# Patient Record
Sex: Female | Born: 1975
Health system: Southern US, Community
[De-identification: ages and names within clinical notes are randomized; demographics above are authoritative.]

## PROBLEM LIST (undated history)

## (undated) DIAGNOSIS — F32A Depression, unspecified: Secondary | ICD-10-CM

## (undated) DIAGNOSIS — Z923 Personal history of irradiation: Secondary | ICD-10-CM

## (undated) DIAGNOSIS — I209 Angina pectoris, unspecified: Secondary | ICD-10-CM

## (undated) DIAGNOSIS — D649 Anemia, unspecified: Secondary | ICD-10-CM

## (undated) DIAGNOSIS — Z803 Family history of malignant neoplasm of breast: Secondary | ICD-10-CM

## (undated) DIAGNOSIS — R002 Palpitations: Secondary | ICD-10-CM

## (undated) DIAGNOSIS — I499 Cardiac arrhythmia, unspecified: Secondary | ICD-10-CM

## (undated) DIAGNOSIS — R079 Chest pain, unspecified: Secondary | ICD-10-CM

## (undated) DIAGNOSIS — Z8669 Personal history of other diseases of the nervous system and sense organs: Secondary | ICD-10-CM

## (undated) DIAGNOSIS — M797 Fibromyalgia: Secondary | ICD-10-CM

## (undated) DIAGNOSIS — R000 Tachycardia, unspecified: Secondary | ICD-10-CM

## (undated) DIAGNOSIS — Z973 Presence of spectacles and contact lenses: Secondary | ICD-10-CM

## (undated) DIAGNOSIS — F329 Major depressive disorder, single episode, unspecified: Secondary | ICD-10-CM

## (undated) DIAGNOSIS — F419 Anxiety disorder, unspecified: Secondary | ICD-10-CM

## (undated) DIAGNOSIS — I340 Nonrheumatic mitral (valve) insufficiency: Secondary | ICD-10-CM

## (undated) DIAGNOSIS — C801 Malignant (primary) neoplasm, unspecified: Secondary | ICD-10-CM

## (undated) DIAGNOSIS — T7840XA Allergy, unspecified, initial encounter: Secondary | ICD-10-CM

## (undated) DIAGNOSIS — Q8500 Neurofibromatosis, unspecified: Secondary | ICD-10-CM

## (undated) DIAGNOSIS — Z8659 Personal history of other mental and behavioral disorders: Secondary | ICD-10-CM

## (undated) HISTORY — DX: Major depressive disorder, single episode, unspecified: F32.9

## (undated) HISTORY — DX: Chest pain, unspecified: R07.9

## (undated) HISTORY — DX: Allergy, unspecified, initial encounter: T78.40XA

## (undated) HISTORY — PX: COLONOSCOPY: SHX174

## (undated) HISTORY — DX: Family history of malignant neoplasm of breast: Z80.3

## (undated) HISTORY — PX: BREAST BIOPSY: SHX20

## (undated) HISTORY — DX: Personal history of other mental and behavioral disorders: Z86.59

## (undated) HISTORY — DX: Neurofibromatosis, unspecified: Q85.00

## (undated) HISTORY — DX: Nonrheumatic mitral (valve) insufficiency: I34.0

## (undated) HISTORY — DX: Personal history of other diseases of the nervous system and sense organs: Z86.69

## (undated) HISTORY — PX: ESOPHAGOGASTRODUODENOSCOPY: SHX1529

## (undated) HISTORY — DX: Palpitations: R00.2

## (undated) HISTORY — DX: Fibromyalgia: M79.7

## (undated) HISTORY — DX: Tachycardia, unspecified: R00.0

## (undated) HISTORY — DX: Anxiety disorder, unspecified: F41.9

## (undated) HISTORY — PX: WISDOM TOOTH EXTRACTION: SHX21

## (undated) HISTORY — DX: Depression, unspecified: F32.A

---

## 2002-01-23 ENCOUNTER — Emergency Department (HOSPITAL_COMMUNITY): Admission: EM | Admit: 2002-01-23 | Discharge: 2002-01-23 | Payer: Self-pay | Admitting: Emergency Medicine

## 2002-01-28 ENCOUNTER — Ambulatory Visit (HOSPITAL_COMMUNITY): Admission: RE | Admit: 2002-01-28 | Discharge: 2002-01-28 | Payer: Self-pay | Admitting: Family Medicine

## 2002-01-28 ENCOUNTER — Encounter: Payer: Self-pay | Admitting: Family Medicine

## 2002-06-28 ENCOUNTER — Emergency Department (HOSPITAL_COMMUNITY): Admission: EM | Admit: 2002-06-28 | Discharge: 2002-06-28 | Payer: Self-pay | Admitting: Emergency Medicine

## 2002-06-28 ENCOUNTER — Encounter: Payer: Self-pay | Admitting: Emergency Medicine

## 2003-09-23 ENCOUNTER — Ambulatory Visit (HOSPITAL_COMMUNITY): Admission: RE | Admit: 2003-09-23 | Discharge: 2003-09-23 | Payer: Self-pay | Admitting: Family Medicine

## 2004-08-22 ENCOUNTER — Ambulatory Visit (HOSPITAL_COMMUNITY): Admission: RE | Admit: 2004-08-22 | Discharge: 2004-08-22 | Payer: Self-pay | Admitting: Family Medicine

## 2004-09-20 ENCOUNTER — Ambulatory Visit (HOSPITAL_COMMUNITY): Admission: RE | Admit: 2004-09-20 | Discharge: 2004-09-20 | Payer: Self-pay | Admitting: Family Medicine

## 2004-09-24 ENCOUNTER — Ambulatory Visit (HOSPITAL_COMMUNITY): Admission: RE | Admit: 2004-09-24 | Discharge: 2004-09-24 | Payer: Self-pay | Admitting: Family Medicine

## 2004-12-14 ENCOUNTER — Ambulatory Visit (HOSPITAL_COMMUNITY): Admission: RE | Admit: 2004-12-14 | Discharge: 2004-12-14 | Payer: Self-pay | Admitting: Neurology

## 2005-01-01 ENCOUNTER — Encounter: Admission: RE | Admit: 2005-01-01 | Discharge: 2005-01-01 | Payer: Self-pay | Admitting: Occupational Medicine

## 2005-05-26 ENCOUNTER — Emergency Department (HOSPITAL_COMMUNITY): Admission: EM | Admit: 2005-05-26 | Discharge: 2005-05-26 | Payer: Self-pay | Admitting: Emergency Medicine

## 2005-08-01 ENCOUNTER — Ambulatory Visit: Payer: Self-pay | Admitting: Oncology

## 2005-10-18 ENCOUNTER — Emergency Department (HOSPITAL_COMMUNITY): Admission: EM | Admit: 2005-10-18 | Discharge: 2005-10-18 | Payer: Self-pay | Admitting: Emergency Medicine

## 2005-11-15 ENCOUNTER — Ambulatory Visit: Payer: Self-pay | Admitting: Oncology

## 2006-07-24 ENCOUNTER — Ambulatory Visit: Payer: Self-pay | Admitting: Oncology

## 2006-07-28 LAB — CBC WITH DIFFERENTIAL/PLATELET
BASO%: 1.2 % (ref 0.0–2.0)
LYMPH%: 25.9 % (ref 14.0–48.0)
MCHC: 34.4 g/dL (ref 32.0–36.0)
MCV: 85.8 fL (ref 81.0–101.0)
MONO#: 0.6 10*3/uL (ref 0.1–0.9)
MONO%: 7.6 % (ref 0.0–13.0)
Platelets: 435 10*3/uL — ABNORMAL HIGH (ref 145–400)
RBC: 4.91 10*6/uL (ref 3.70–5.32)
RDW: 12.4 % (ref 11.3–14.5)
WBC: 7.5 10*3/uL (ref 3.9–10.0)

## 2006-10-14 ENCOUNTER — Emergency Department (HOSPITAL_COMMUNITY): Admission: EM | Admit: 2006-10-14 | Discharge: 2006-10-14 | Payer: Self-pay | Admitting: Emergency Medicine

## 2007-07-22 ENCOUNTER — Ambulatory Visit: Payer: Self-pay | Admitting: Oncology

## 2007-07-27 LAB — CBC WITH DIFFERENTIAL/PLATELET
BASO%: 1.7 % (ref 0.0–2.0)
MCHC: 35.4 g/dL (ref 32.0–36.0)
MONO#: 0.6 10*3/uL (ref 0.1–0.9)
RBC: 4.73 10*6/uL (ref 3.70–5.32)
RDW: 10 % — ABNORMAL LOW (ref 11.3–14.5)
WBC: 6.9 10*3/uL (ref 3.9–10.0)
lymph#: 1.8 10*3/uL (ref 0.9–3.3)

## 2007-08-12 ENCOUNTER — Ambulatory Visit: Payer: Self-pay | Admitting: Vascular Surgery

## 2008-07-22 ENCOUNTER — Ambulatory Visit: Payer: Self-pay | Admitting: Oncology

## 2008-08-12 LAB — CBC WITH DIFFERENTIAL/PLATELET
EOS%: 4.2 % (ref 0.0–7.0)
Eosinophils Absolute: 0.3 10*3/uL (ref 0.0–0.5)
LYMPH%: 24.5 % (ref 14.0–48.0)
MCH: 29.3 pg (ref 26.0–34.0)
MCV: 83.3 fL (ref 81.0–101.0)
MONO%: 9.3 % (ref 0.0–13.0)
NEUT#: 4.3 10*3/uL (ref 1.5–6.5)
NEUT%: 60.2 % (ref 39.6–76.8)
Platelets: 436 10*3/uL — ABNORMAL HIGH (ref 145–400)
RBC: 4.84 10*6/uL (ref 3.70–5.32)
RDW: 12.1 % (ref 11.3–14.5)

## 2008-11-30 ENCOUNTER — Encounter: Payer: Self-pay | Admitting: Gastroenterology

## 2008-12-13 ENCOUNTER — Encounter: Payer: Self-pay | Admitting: Gastroenterology

## 2009-01-12 ENCOUNTER — Ambulatory Visit: Payer: Self-pay | Admitting: Gastroenterology

## 2009-01-12 DIAGNOSIS — K921 Melena: Secondary | ICD-10-CM | POA: Insufficient documentation

## 2009-01-12 DIAGNOSIS — R195 Other fecal abnormalities: Secondary | ICD-10-CM | POA: Insufficient documentation

## 2009-02-01 ENCOUNTER — Ambulatory Visit: Payer: Self-pay | Admitting: Gastroenterology

## 2010-04-26 ENCOUNTER — Ambulatory Visit: Payer: Self-pay | Admitting: Cardiovascular Disease

## 2010-04-30 ENCOUNTER — Ambulatory Visit: Payer: Self-pay | Admitting: Cardiovascular Disease

## 2010-10-06 ENCOUNTER — Encounter: Payer: Self-pay | Admitting: Family Medicine

## 2010-11-21 ENCOUNTER — Other Ambulatory Visit (HOSPITAL_COMMUNITY): Payer: Self-pay | Admitting: Pediatrics

## 2010-11-21 DIAGNOSIS — Q8501 Neurofibromatosis, type 1: Secondary | ICD-10-CM

## 2010-12-01 ENCOUNTER — Ambulatory Visit (HOSPITAL_COMMUNITY)
Admission: RE | Admit: 2010-12-01 | Discharge: 2010-12-01 | Disposition: A | Discharge status: Home / Self Care | Payer: 59 | Source: Ambulatory Visit | Attending: Pediatrics | Admitting: Pediatrics

## 2010-12-01 DIAGNOSIS — J3489 Other specified disorders of nose and nasal sinuses: Secondary | ICD-10-CM | POA: Insufficient documentation

## 2010-12-01 DIAGNOSIS — Q8501 Neurofibromatosis, type 1: Secondary | ICD-10-CM

## 2010-12-01 DIAGNOSIS — D1809 Hemangioma of other sites: Secondary | ICD-10-CM | POA: Insufficient documentation

## 2010-12-01 DIAGNOSIS — H9319 Tinnitus, unspecified ear: Secondary | ICD-10-CM | POA: Insufficient documentation

## 2010-12-01 DIAGNOSIS — G96198 Other disorders of meninges, not elsewhere classified: Secondary | ICD-10-CM | POA: Insufficient documentation

## 2010-12-01 DIAGNOSIS — M5090 Cervical disc disorder, unspecified, unspecified cervical region: Secondary | ICD-10-CM | POA: Insufficient documentation

## 2010-12-01 DIAGNOSIS — M5126 Other intervertebral disc displacement, lumbar region: Secondary | ICD-10-CM | POA: Insufficient documentation

## 2010-12-01 MED ORDER — GADOBENATE DIMEGLUMINE 529 MG/ML IV SOLN
12.0000 mL | Freq: Once | INTRAVENOUS | Status: AC | PRN
  Administered 2010-12-01: 12 mL via INTRAVENOUS

## 2010-12-03 ENCOUNTER — Other Ambulatory Visit (HOSPITAL_COMMUNITY): Payer: Self-pay

## 2011-01-29 NOTE — Procedures (Signed)
VASCULAR LAB EXAM   INDICATION:  Abdominal bruit.   HISTORY:  Diabetes:  No.  Cardiac:  Tachycardia.  Hypertension:  No.   EXAM:  Abdominal duplex.   IMPRESSION:  1. Proximal aorta = 160 cm/s.  2. Mid aorta = 110 cm/s.  3. Distal aorta = 93 cm/s.  4. Right renal artery origin = 96/23 cm/s.  5. Left renal artery origin = 121/31 cm/s.  6. Superior mesenteric artery = 149/30 cm/s.  7. Inferior mesenteric artery = 117 cm/s.  8. Celiac artery with inspiration = 317/134 cm/s.  9. Celiac artery with expiration = 206/44 cm/s.  10.Abdominal aorta appears patent with no evidence of dilatation or      stenosis.  11.Celiac axis is patent with no evidence of significant pathology;      however, demonstrates evidence of compression syndrome with      velocities significantly increasing with inspiration versus      expiration, which is probable cause of abdominal bruit.  12.Superior mesenteric artery, inferior mesenteric artery, and      bilateral renal artery origins appear patent with no evidence of      ostial stenosis.   ___________________________________________  Larina Earthly, M.D.   AS/MEDQ  D:  08/12/2007  T:  08/12/2007  Job:  [id number]

## 2011-06-24 ENCOUNTER — Telehealth: Payer: Self-pay | Admitting: Cardiovascular Disease

## 2011-06-24 NOTE — Telephone Encounter (Signed)
Pt called. Refill of toprol called to [facility] pharmacy [phone]

## 2011-06-25 NOTE — Telephone Encounter (Signed)
Pt cant make dr app now, will see if primary dr will refill and will call back if we need to fill, pt not seen since 2011.

## 2012-02-21 ENCOUNTER — Telehealth: Payer: Self-pay | Admitting: Cardiovascular Disease

## 2012-02-21 MED ORDER — METOPROLOL SUCCINATE ER 25 MG PO TB24: 25.0000 mg | ORAL_TABLET | Freq: Every day | ORAL | Status: DC

## 2012-02-21 MED ORDER — PROPRANOLOL HCL 10 MG PO TABS: 10.0000 mg | ORAL_TABLET | Freq: Four times a day (QID) | ORAL | Status: DC | PRN

## 2012-02-21 NOTE — Telephone Encounter (Signed)
pt been off meds, didn't see pcp for refill of toprol/ propranolol 10 mg  from 06/24/12 phone conversation. bp 137/86, p 100,  pt at work,  Doctor, hospital with Dr [name]/ paper chart reviewed, pt to restart toprol xl 25 mg daily, use propranolol 10 mg prn palpitations and f/u 1-2 months or sooner if medication don't resolve.

## 2012-02-21 NOTE — Telephone Encounter (Signed)
Attempted several call backs/ msg left for her to call back.

## 2012-02-21 NOTE — Telephone Encounter (Signed)
New msg Pt called and said her heart rate is up today 120-150. Please call her back

## 2012-02-21 NOTE — Telephone Encounter (Signed)
Informed pt of Dr Namon Cirri recommendations/ meds ordered/ app made for f/u, pt to call if has further symptoms. Pt agreed to plan.

## 2012-02-26 ENCOUNTER — Encounter: Payer: Self-pay | Admitting: *Deleted

## 2012-02-26 DIAGNOSIS — R079 Chest pain, unspecified: Secondary | ICD-10-CM | POA: Insufficient documentation

## 2012-02-26 DIAGNOSIS — R000 Tachycardia, unspecified: Secondary | ICD-10-CM | POA: Insufficient documentation

## 2012-02-26 DIAGNOSIS — Z8669 Personal history of other diseases of the nervous system and sense organs: Secondary | ICD-10-CM | POA: Insufficient documentation

## 2012-02-26 DIAGNOSIS — Z8659 Personal history of other mental and behavioral disorders: Secondary | ICD-10-CM | POA: Insufficient documentation

## 2012-02-26 DIAGNOSIS — I340 Nonrheumatic mitral (valve) insufficiency: Secondary | ICD-10-CM | POA: Insufficient documentation

## 2012-02-26 DIAGNOSIS — R002 Palpitations: Secondary | ICD-10-CM | POA: Insufficient documentation

## 2012-02-26 DIAGNOSIS — Q8500 Neurofibromatosis, unspecified: Secondary | ICD-10-CM | POA: Insufficient documentation

## 2012-03-03 ENCOUNTER — Telehealth: Payer: Self-pay | Admitting: Cardiovascular Disease

## 2012-03-03 MED ORDER — METOPROLOL SUCCINATE ER 50 MG PO TB24: 50.0000 mg | ORAL_TABLET | Freq: Every day | ORAL | Status: DC

## 2012-03-03 NOTE — Telephone Encounter (Signed)
Please have her increase metoprolol to 50 mg daily.  Avoid salt.  Set up ov in a week.

## 2012-03-03 NOTE — Telephone Encounter (Signed)
New msg Pt is taking toprol last Friday BP 140/100 still elevated and hr still 100. Please call

## 2012-03-03 NOTE — Telephone Encounter (Signed)
At pcp yesterday bp was 140/100 p 100,   today 122/92 p 100, pt is on metoprolol succinate 25 mg daily. Propranolol being used occasionally for palpitations.  Please advise if you would like her meds adjusted.

## 2012-03-03 NOTE — Telephone Encounter (Signed)
Pt called,metoprolol 25mg   to be doubled till gone then get new script for one tablet daily. High sodium foods reviewed. App made for 1 week.

## 2012-03-05 ENCOUNTER — Encounter: Payer: Self-pay | Admitting: Cardiovascular Disease

## 2012-03-05 ENCOUNTER — Ambulatory Visit (INDEPENDENT_AMBULATORY_CARE_PROVIDER_SITE_OTHER): Payer: 59 | Admitting: Cardiovascular Disease

## 2012-03-05 VITALS — BP 114/84 | HR 88 | Resp 14 | Ht 62.0 in | Wt 136.0 lb

## 2012-03-05 DIAGNOSIS — R000 Tachycardia, unspecified: Secondary | ICD-10-CM

## 2012-03-05 DIAGNOSIS — R002 Palpitations: Secondary | ICD-10-CM

## 2012-03-05 NOTE — Assessment & Plan Note (Signed)
Anne Horton presents today for followup of her tachycardia and her palpitations. She's feeling much better on the Toprol-XL 50 mg a day. She's had labs at her medical doctors and have been satisfactory. Her TSH is been normal.  She's had lots of stress at home and at work. This is probably the cause of her palpitations.  We Also considered the possibility that she may be doing close to menopause.

## 2012-03-05 NOTE — Patient Instructions (Addendum)
Your physician wants you to follow-up in: 6 months  You will receive a reminder letter in the mail two months in advance. If you don't receive a letter, please call our office to schedule the follow-up appointment.  Your physician recommends that you continue on your current medications as directed. Please refer to the Current Medication list given to you today.  

## 2012-03-05 NOTE — Progress Notes (Signed)
    Anne Horton Date of Birth  08-18-1976       [facility]    [place] [address]  [address] [place], [place]  [address]   [place], [place]  [address] [phone]     [phone]   Fax  [phone]    Fax [phone]  Problem List: 1, HTN 2. Palpitations 3. Neurofibromatosis  History of Present Illness:  Anne Horton is a 36 year old female who was seen in the past. She has had some problems with high blood pressure and tachycardia recently. We started her on Toprol-XL 25 mg a day. This seemed to help but she was still having some high heart rates. We increased her rate to 50 and she seems to be feeling a lot better.  She's been under lots of stress. She has lots of stress at home and at work.  Current Outpatient Prescriptions on File Prior to Visit  Medication Sig Dispense Refill  . cetirizine (ZYRTEC) 10 MG tablet Take 10 mg by mouth as needed.      . DULoxetine (CYMBALTA) 30 MG capsule Take 30 mg by mouth daily.      Anne Horton 91-Day (JOLESSA PO) Take by mouth.      . metoprolol succinate (TOPROL XL) 50 MG 24 hr tablet Take 1 tablet (50 mg total) by mouth daily.  30 tablet  2  . propranolol (INDERAL) 10 MG tablet Take 1 tablet (10 mg total) by mouth 4 (four) times daily as needed (palpitations).  30 tablet  2    Allergies  Allergen Reactions  . Latex   . Other     Latex-rash    Past Medical History  Diagnosis Date  . Chest pain   . Palpitations   . Neurofibromatosis   . Tachycardia     Hx. of  . Hx of migraines   . H/O: depression   . Mitral regurgitation     mild per echo EF 55-60%    Past Surgical History  Procedure Date  . Cesarean section     History  Smoking status  . Former Smoker  . Quit date: 09/17/1995  Smokeless tobacco  . Not on file    History  Alcohol Use     No family history on file.  Reviw of Systems:  Reviewed in the HPI.  All other systems are  negative.  Physical Exam: Blood pressure 114/84, pulse 88, resp. rate 14, height 5\' 2"  (1.575 m), weight 136 lb (61.689 kg), last menstrual period 11/17/2011. General: Well developed, well nourished, in no acute distress.  She has several fibromas are visible. She has a history of neurofibromatosis  Head: Normocephalic, atraumatic, sclera non-icteric, mucus membranes are moist,   Neck: Supple. Carotids are 2 + without bruits. No JVD  Lungs: Clear bilaterally to auscultation.  Heart: regular rate.  normal  S1 S2. No murmurs, gallops or rubs.  Abdomen: Soft, non-tender, non-distended with normal bowel sounds. No hepatomegaly. No rebound/guarding. No masses.  Msk:  Strength and tone are normal  Extremities: No clubbing or cyanosis. No edema.  Distal pedal pulses are 2+ and equal bilaterally.  Neuro: Alert and oriented X 3. Moves all extremities spontaneously.  Psych:  Responds to questions appropriately with a normal affect.  ECG: March 05, 2012 - NSR at 81, no ST or T wave changes  Assessment / Plan:

## 2012-03-24 ENCOUNTER — Ambulatory Visit: Payer: Commercial Managed Care - PPO | Admitting: Cardiovascular Disease

## 2012-06-11 ENCOUNTER — Other Ambulatory Visit: Payer: Self-pay | Admitting: Cardiovascular Disease

## 2012-06-11 NOTE — Telephone Encounter (Signed)
Fax Received. Refill Completed. Adir Schicker Chowoe (R.M.A)   

## 2012-06-23 ENCOUNTER — Encounter: Payer: Self-pay | Admitting: Cardiovascular Disease

## 2012-12-30 ENCOUNTER — Telehealth: Payer: Self-pay

## 2012-12-30 MED ORDER — METOPROLOL SUCCINATE ER 50 MG PO TB24: ORAL_TABLET | ORAL | Status: DC

## 2012-12-30 NOTE — Telephone Encounter (Signed)
pt called to rqst refill for metoporol. pt was past due for follow up w/Dr.[name] scheduled pt for 03/15/13 @2 :00 pm.refilled  metoprol 30 R-3.completed

## 2013-01-08 ENCOUNTER — Encounter (HOSPITAL_COMMUNITY): Payer: Self-pay | Admitting: Emergency Medicine

## 2013-01-08 ENCOUNTER — Emergency Department (HOSPITAL_COMMUNITY): Payer: 59

## 2013-01-08 ENCOUNTER — Emergency Department (HOSPITAL_COMMUNITY)
Admission: EM | Admit: 2013-01-08 | Discharge: 2013-01-08 | Disposition: A | Discharge status: Home / Self Care | Payer: 59 | Attending: Emergency Medicine | Admitting: Emergency Medicine

## 2013-01-08 DIAGNOSIS — Z87891 Personal history of nicotine dependence: Secondary | ICD-10-CM | POA: Insufficient documentation

## 2013-01-08 DIAGNOSIS — Y92410 Unspecified street and highway as the place of occurrence of the external cause: Secondary | ICD-10-CM | POA: Insufficient documentation

## 2013-01-08 DIAGNOSIS — IMO0002 Reserved for concepts with insufficient information to code with codable children: Secondary | ICD-10-CM | POA: Insufficient documentation

## 2013-01-08 DIAGNOSIS — Z8679 Personal history of other diseases of the circulatory system: Secondary | ICD-10-CM | POA: Insufficient documentation

## 2013-01-08 DIAGNOSIS — Y9389 Activity, other specified: Secondary | ICD-10-CM | POA: Insufficient documentation

## 2013-01-08 DIAGNOSIS — Z91040 Latex allergy status: Secondary | ICD-10-CM | POA: Insufficient documentation

## 2013-01-08 DIAGNOSIS — Z8659 Personal history of other mental and behavioral disorders: Secondary | ICD-10-CM | POA: Insufficient documentation

## 2013-01-08 DIAGNOSIS — Z79899 Other long term (current) drug therapy: Secondary | ICD-10-CM | POA: Insufficient documentation

## 2013-01-08 DIAGNOSIS — Z8781 Personal history of (healed) traumatic fracture: Secondary | ICD-10-CM | POA: Insufficient documentation

## 2013-01-08 DIAGNOSIS — S82402A Unspecified fracture of shaft of left fibula, initial encounter for closed fracture: Secondary | ICD-10-CM

## 2013-01-08 DIAGNOSIS — S82899A Other fracture of unspecified lower leg, initial encounter for closed fracture: Secondary | ICD-10-CM | POA: Insufficient documentation

## 2013-01-08 DIAGNOSIS — S0993XA Unspecified injury of face, initial encounter: Secondary | ICD-10-CM | POA: Insufficient documentation

## 2013-01-08 DIAGNOSIS — Z8669 Personal history of other diseases of the nervous system and sense organs: Secondary | ICD-10-CM | POA: Insufficient documentation

## 2013-01-08 MED ORDER — OXYCODONE-ACETAMINOPHEN 5-325 MG PO TABS
2.0000 | ORAL_TABLET | Freq: Once | ORAL | Status: AC
  Administered 2013-01-08: 2 via ORAL
  Filled 2013-01-08: qty 2

## 2013-01-08 MED ORDER — OXYCODONE-ACETAMINOPHEN 5-325 MG PO TABS: 1.0000 | ORAL_TABLET | Freq: Four times a day (QID) | ORAL | Status: DC | PRN
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
## 2013-01-08 NOTE — Progress Notes (Signed)
Orthopedic Tech Progress Note Patient Details:  Anne Horton 07-30-1976 [id number] Applied short leg splint to LLE. Ortho Devices Type of Ortho Device: Short leg splint Ortho Device/Splint Interventions: Application   Lesle Chris 01/08/2013, 9:16 PM
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
## 2013-01-09 ENCOUNTER — Encounter (HOSPITAL_BASED_OUTPATIENT_CLINIC_OR_DEPARTMENT_OTHER): Payer: Self-pay | Admitting: *Deleted

## 2013-01-09 ENCOUNTER — Emergency Department (HOSPITAL_BASED_OUTPATIENT_CLINIC_OR_DEPARTMENT_OTHER)
Admission: EM | Admit: 2013-01-09 | Discharge: 2013-01-09 | Disposition: A | Discharge status: Home / Self Care | Payer: 59 | Attending: Emergency Medicine | Admitting: Emergency Medicine

## 2013-01-09 DIAGNOSIS — S8011XA Contusion of right lower leg, initial encounter: Secondary | ICD-10-CM

## 2013-01-09 DIAGNOSIS — S8010XA Contusion of unspecified lower leg, initial encounter: Secondary | ICD-10-CM | POA: Insufficient documentation

## 2013-01-09 DIAGNOSIS — Y92410 Unspecified street and highway as the place of occurrence of the external cause: Secondary | ICD-10-CM | POA: Insufficient documentation

## 2013-01-09 DIAGNOSIS — I059 Rheumatic mitral valve disease, unspecified: Secondary | ICD-10-CM | POA: Insufficient documentation

## 2013-01-09 DIAGNOSIS — Z79899 Other long term (current) drug therapy: Secondary | ICD-10-CM | POA: Insufficient documentation

## 2013-01-09 DIAGNOSIS — Z87891 Personal history of nicotine dependence: Secondary | ICD-10-CM | POA: Insufficient documentation

## 2013-01-09 DIAGNOSIS — F3289 Other specified depressive episodes: Secondary | ICD-10-CM | POA: Insufficient documentation

## 2013-01-09 DIAGNOSIS — Z8679 Personal history of other diseases of the circulatory system: Secondary | ICD-10-CM | POA: Insufficient documentation

## 2013-01-09 DIAGNOSIS — Y9389 Activity, other specified: Secondary | ICD-10-CM | POA: Insufficient documentation

## 2013-01-09 DIAGNOSIS — F329 Major depressive disorder, single episode, unspecified: Secondary | ICD-10-CM | POA: Insufficient documentation

## 2013-01-09 DIAGNOSIS — Z91040 Latex allergy status: Secondary | ICD-10-CM | POA: Insufficient documentation

## 2013-01-09 DIAGNOSIS — Z8669 Personal history of other diseases of the nervous system and sense organs: Secondary | ICD-10-CM | POA: Insufficient documentation

## 2013-01-09 MED ORDER — OXYCODONE-ACETAMINOPHEN 5-325 MG PO TABS
2.0000 | ORAL_TABLET | Freq: Once | ORAL | Status: AC
  Administered 2013-01-09: 2 via ORAL
  Filled 2013-01-09 (×2): qty 2

## 2013-01-09 NOTE — ED Provider Notes (Signed)
Medical screening examination/treatment/procedure(s) were performed by non-physician practitioner and as supervising physician I was immediately available for consultation/collaboration.  Toy Baker, MD 01/09/13 [phone]

## 2013-01-09 NOTE — ED Provider Notes (Signed)
Medical screening examination/treatment/procedure(s) were performed by non-physician practitioner and as supervising physician I was immediately available for consultation/collaboration.   Glynn Octave, MD 01/09/13 2306
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
## 2013-01-09 NOTE — ED Notes (Signed)
Provider at the bedside.  

## 2013-01-12 ENCOUNTER — Other Ambulatory Visit (HOSPITAL_COMMUNITY): Payer: Self-pay | Admitting: Orthopedic Surgery

## 2013-01-12 ENCOUNTER — Ambulatory Visit (HOSPITAL_COMMUNITY)
Admission: RE | Admit: 2013-01-12 | Discharge: 2013-01-12 | Disposition: A | Discharge status: Home / Self Care | Payer: 59 | Source: Ambulatory Visit | Attending: Orthopedic Surgery | Admitting: Orthopedic Surgery

## 2013-01-12 DIAGNOSIS — M79609 Pain in unspecified limb: Secondary | ICD-10-CM

## 2013-01-12 DIAGNOSIS — M7989 Other specified soft tissue disorders: Secondary | ICD-10-CM

## 2013-01-12 DIAGNOSIS — M25562 Pain in left knee: Secondary | ICD-10-CM

## 2013-01-12 NOTE — Progress Notes (Signed)
*  PRELIMINARY RESULTS* Vascular Ultrasound Left lower extremity venous duplex has been completed.  Preliminary findings: Left=  No evidence of DVT. There is a heterogenous area located at the anterior medial lower leg that is consistent with hematoma. This could also potentially be a muscle tear.  Called report to [employer].  Farrel Demark, RDMS, RVT  01/12/2013, 10:50 AM

## 2013-02-15 ENCOUNTER — Ambulatory Visit (HOSPITAL_COMMUNITY)
Admission: RE | Admit: 2013-02-15 | Discharge: 2013-02-15 | Disposition: A | Discharge status: Home / Self Care | Payer: 59 | Source: Ambulatory Visit | Attending: Orthopedic Surgery | Admitting: Orthopedic Surgery

## 2013-02-15 ENCOUNTER — Other Ambulatory Visit (HOSPITAL_COMMUNITY): Payer: Self-pay | Admitting: Orthopedic Surgery

## 2013-02-15 DIAGNOSIS — M25562 Pain in left knee: Secondary | ICD-10-CM

## 2013-02-15 DIAGNOSIS — M79609 Pain in unspecified limb: Secondary | ICD-10-CM

## 2013-02-15 DIAGNOSIS — M7989 Other specified soft tissue disorders: Secondary | ICD-10-CM

## 2013-02-15 NOTE — Progress Notes (Signed)
*  Preliminary Results* Left lower extremity venous duplex completed. Left lower extremity is negative for deep vein thrombosis. There is no evidence of left baker's cyst. There is a heterogenous area noted in the left anteromedial mid calf, suggestive of a hematoma.  Preliminary results discussed with Junious Dresser of Dr.Gioffre's office.  02/15/2013 3:25 PM Gertie Fey, RDMS, RDCS

## 2013-03-15 ENCOUNTER — Ambulatory Visit (INDEPENDENT_AMBULATORY_CARE_PROVIDER_SITE_OTHER): Payer: 59 | Admitting: Cardiovascular Disease

## 2013-03-15 ENCOUNTER — Encounter: Payer: Self-pay | Admitting: Cardiovascular Disease

## 2013-03-15 VITALS — BP 108/80 | HR 77 | Ht 62.0 in | Wt 140.1 lb

## 2013-03-15 DIAGNOSIS — R079 Chest pain, unspecified: Secondary | ICD-10-CM

## 2013-03-15 DIAGNOSIS — R000 Tachycardia, unspecified: Secondary | ICD-10-CM

## 2013-03-15 MED ORDER — PROPRANOLOL HCL 10 MG PO TABS: 10.0000 mg | ORAL_TABLET | Freq: Four times a day (QID) | ORAL | Status: DC | PRN

## 2013-03-15 NOTE — Patient Instructions (Addendum)
Your physician wants you to follow-up in: 1 year  You will receive a reminder letter in the mail two months in advance. If you don't receive a letter, please call our office to schedule the follow-up appointment.  Please try to bring labs with you on your next visit

## 2013-03-15 NOTE — Assessment & Plan Note (Signed)
Continue current meds.  I have asked her to exercise when possible.

## 2013-03-15 NOTE — Assessment & Plan Note (Signed)
She has some atypical CP.  Stable.
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
## 2013-05-14 ENCOUNTER — Other Ambulatory Visit: Payer: Self-pay | Admitting: Cardiovascular Disease

## 2013-05-14 ENCOUNTER — Other Ambulatory Visit: Payer: Self-pay | Admitting: *Deleted

## 2013-05-14 ENCOUNTER — Ambulatory Visit (HOSPITAL_BASED_OUTPATIENT_CLINIC_OR_DEPARTMENT_OTHER): Payer: 59 | Admitting: Lab

## 2013-05-14 DIAGNOSIS — D649 Anemia, unspecified: Secondary | ICD-10-CM

## 2013-05-14 LAB — CBC WITH DIFFERENTIAL/PLATELET
Basophils Absolute: 0.1 10*3/uL (ref 0.0–0.1)
Eosinophils Absolute: 0.3 10*3/uL (ref 0.0–0.5)
HCT: 24.8 % — ABNORMAL LOW (ref 34.8–46.6)
HGB: 7.6 g/dL — ABNORMAL LOW (ref 11.6–15.9)
LYMPH%: 28 % (ref 14.0–49.7)
MCHC: 30.7 g/dL — ABNORMAL LOW (ref 31.5–36.0)
MONO#: 0.9 10*3/uL (ref 0.1–0.9)
NEUT#: 4.7 10*3/uL (ref 1.5–6.5)
NEUT%: 55.4 % (ref 38.4–76.8)
Platelets: 455 10*3/uL — ABNORMAL HIGH (ref 145–400)
WBC: 8.4 10*3/uL (ref 3.9–10.3)
lymph#: 2.4 10*3/uL (ref 0.9–3.3)

## 2013-05-21 ENCOUNTER — Encounter (HOSPITAL_COMMUNITY): Payer: Self-pay | Admitting: *Deleted

## 2013-05-21 ENCOUNTER — Telehealth: Payer: Self-pay | Admitting: *Deleted

## 2013-05-21 ENCOUNTER — Emergency Department (HOSPITAL_COMMUNITY)
Admission: EM | Admit: 2013-05-21 | Discharge: 2013-05-21 | Disposition: A | Discharge status: Home / Self Care | Payer: 59 | Attending: Emergency Medicine | Admitting: Emergency Medicine

## 2013-05-21 DIAGNOSIS — F329 Major depressive disorder, single episode, unspecified: Secondary | ICD-10-CM | POA: Insufficient documentation

## 2013-05-21 DIAGNOSIS — R51 Headache: Secondary | ICD-10-CM | POA: Insufficient documentation

## 2013-05-21 DIAGNOSIS — R42 Dizziness and giddiness: Secondary | ICD-10-CM | POA: Insufficient documentation

## 2013-05-21 DIAGNOSIS — Z79899 Other long term (current) drug therapy: Secondary | ICD-10-CM | POA: Insufficient documentation

## 2013-05-21 DIAGNOSIS — Z8669 Personal history of other diseases of the nervous system and sense organs: Secondary | ICD-10-CM | POA: Insufficient documentation

## 2013-05-21 DIAGNOSIS — Z87828 Personal history of other (healed) physical injury and trauma: Secondary | ICD-10-CM | POA: Insufficient documentation

## 2013-05-21 DIAGNOSIS — R52 Pain, unspecified: Secondary | ICD-10-CM | POA: Insufficient documentation

## 2013-05-21 DIAGNOSIS — F3289 Other specified depressive episodes: Secondary | ICD-10-CM | POA: Insufficient documentation

## 2013-05-21 DIAGNOSIS — Z7982 Long term (current) use of aspirin: Secondary | ICD-10-CM | POA: Insufficient documentation

## 2013-05-21 DIAGNOSIS — R079 Chest pain, unspecified: Secondary | ICD-10-CM

## 2013-05-21 DIAGNOSIS — Z91040 Latex allergy status: Secondary | ICD-10-CM | POA: Insufficient documentation

## 2013-05-21 DIAGNOSIS — R5381 Other malaise: Secondary | ICD-10-CM | POA: Insufficient documentation

## 2013-05-21 DIAGNOSIS — Z87891 Personal history of nicotine dependence: Secondary | ICD-10-CM | POA: Insufficient documentation

## 2013-05-21 DIAGNOSIS — Z8679 Personal history of other diseases of the circulatory system: Secondary | ICD-10-CM | POA: Insufficient documentation

## 2013-05-21 DIAGNOSIS — R0602 Shortness of breath: Secondary | ICD-10-CM | POA: Insufficient documentation

## 2013-05-21 DIAGNOSIS — R0789 Other chest pain: Secondary | ICD-10-CM | POA: Insufficient documentation

## 2013-05-21 DIAGNOSIS — R110 Nausea: Secondary | ICD-10-CM | POA: Insufficient documentation

## 2013-05-21 DIAGNOSIS — R5383 Other fatigue: Secondary | ICD-10-CM

## 2013-05-21 DIAGNOSIS — D649 Anemia, unspecified: Secondary | ICD-10-CM | POA: Insufficient documentation

## 2013-05-21 HISTORY — DX: Anemia, unspecified: D64.9

## 2013-05-21 LAB — BASIC METABOLIC PANEL
BUN: 11 mg/dL (ref 6–23)
CO2: 26 mEq/L (ref 19–32)
Calcium: 9.2 mg/dL (ref 8.4–10.5)
Chloride: 103 mEq/L (ref 96–112)
Creatinine, Ser: 0.56 mg/dL (ref 0.50–1.10)
GFR calc Af Amer: >90 mL/min (ref >90)
GFR calc non Af Amer: >90 mL/min (ref >90)
Glucose, Bld: 92 mg/dL (ref 70–99)
Potassium: 3.9 mEq/L (ref 3.5–5.1)
Sodium: 137 mEq/L (ref 135–145)

## 2013-05-21 LAB — CBC
HCT: 27.3 % — ABNORMAL LOW (ref 36.0–46.0)
Hemoglobin: 8.1 g/dL — ABNORMAL LOW (ref 12.0–15.0)
MCH: 20 pg — ABNORMAL LOW (ref 26.0–34.0)
MCHC: 29.7 g/dL — ABNORMAL LOW (ref 30.0–36.0)
MCV: 67.2 fL — ABNORMAL LOW (ref 78.0–100.0)
Platelets: 414 10*3/uL — ABNORMAL HIGH (ref 150–400)
RBC: 4.06 MIL/uL (ref 3.87–5.11)
RDW: 15.2 % (ref 11.5–15.5)
WBC: 7.9 10*3/uL (ref 4.0–10.5)

## 2013-05-21 LAB — D-DIMER, QUANTITATIVE (NOT AT ARMC): D-Dimer, Quant: <0.27 ug/mL-FEU (ref 0.00–0.48)

## 2013-05-21 LAB — POCT I-STAT TROPONIN I: Troponin i, poc: 0.01 ng/mL (ref 0.00–0.08)
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
## 2013-05-21 NOTE — ED Notes (Signed)
Pt reports hx of hgb of 7.6 05/14/2013, pt has been experiencing left sided chest pain radiating to her back x1 week intermittently - admits to some shortness of breath, nausea, and light headedness. Pt advised to start on iron supplement, pt has been feeling progressively worse and advised by her hematologist to be evaluated in ED. Pt A&Ox4, in no acute distress, skin warm and dry.
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
## 2013-05-21 NOTE — Telephone Encounter (Signed)
Pt now sees [facility]. PCP's office # is [id number].

## 2013-05-25 NOTE — ED Provider Notes (Signed)
Medical screening examination/treatment/procedure(s) were performed by non-physician practitioner and as supervising physician I was immediately available for consultation/collaboration.  Raeford Razor, MD 05/25/13 [phone]

## 2013-05-26 ENCOUNTER — Encounter: Payer: Self-pay | Admitting: Oncology

## 2013-05-26 NOTE — Progress Notes (Signed)
I received a CBC from her primary physician, Dr. Montez Morita, last month. The hematologic indices were consistent with a diagnosis of iron deficiency. This was confirmed on a repeat CBC and ferritin level here.  I recommended Anne Horton began iron replacement and followup with Dr. Montez Morita for evaluation of the iron deficiency. I discussed the case with Dr. Montez Morita on 05/24/2013. She has received the laboratory studies from our office. She will evaluate Anne Horton for a source of blood loss and manage the iron deficiency anemia. Dr. Montez Morita will contact me if the anemia does not correct with iron or if I can be of further assistance from a hematologic standpoint. I will be glad to formally evaluate Anne Horton in the future as needed.

## 2013-07-22 ENCOUNTER — Other Ambulatory Visit: Payer: Self-pay

## 2013-09-20 ENCOUNTER — Other Ambulatory Visit: Payer: Self-pay | Admitting: Cardiovascular Disease

## 2014-01-26 ENCOUNTER — Other Ambulatory Visit: Payer: Self-pay | Admitting: Cardiovascular Disease

## 2014-03-01 ENCOUNTER — Other Ambulatory Visit: Payer: Self-pay | Admitting: Cardiovascular Disease

## 2014-03-25 ENCOUNTER — Ambulatory Visit (INDEPENDENT_AMBULATORY_CARE_PROVIDER_SITE_OTHER): Payer: 59 | Admitting: Cardiovascular Disease

## 2014-03-25 ENCOUNTER — Encounter: Payer: Self-pay | Admitting: Cardiovascular Disease

## 2014-03-25 VITALS — BP 110/84 | HR 83 | Ht 62.0 in | Wt 139.0 lb

## 2014-03-25 DIAGNOSIS — I10 Essential (primary) hypertension: Secondary | ICD-10-CM

## 2014-03-25 DIAGNOSIS — R000 Tachycardia, unspecified: Secondary | ICD-10-CM

## 2014-03-25 LAB — HEPATIC FUNCTION PANEL
ALBUMIN: 3.6 g/dL (ref 3.5–5.2)
ALK PHOS: 43 U/L (ref 39–117)
ALT: 15 U/L (ref 0–35)
AST: 17 U/L (ref 0–37)
BILIRUBIN DIRECT: 0 mg/dL (ref 0.0–0.3)
BILIRUBIN TOTAL: 0.5 mg/dL (ref 0.2–1.2)
Total Protein: 6.5 g/dL (ref 6.0–8.3)

## 2014-03-25 LAB — BASIC METABOLIC PANEL
BUN: 10 mg/dL (ref 6–23)
CALCIUM: 9.1 mg/dL (ref 8.4–10.5)
CO2: 30 mEq/L (ref 19–32)
Chloride: 102 mEq/L (ref 96–112)
Creatinine, Ser: 0.6 mg/dL (ref 0.4–1.2)
GFR: 108.95 mL/min (ref >60.00)
GLUCOSE: 80 mg/dL (ref 70–99)
Potassium: 4 mEq/L (ref 3.5–5.1)
SODIUM: 137 meq/L (ref 135–145)

## 2014-03-25 LAB — LIPID PANEL
Cholesterol: 225 mg/dL — ABNORMAL HIGH (ref 0–200)
HDL: 50.1 mg/dL (ref >39.00)
LDL Cholesterol: 146 mg/dL — ABNORMAL HIGH (ref 0–99)
NONHDL: 174.9
Total CHOL/HDL Ratio: 4
Triglycerides: 143 mg/dL (ref 0.0–149.0)
VLDL: 28.6 mg/dL (ref 0.0–40.0)

## 2014-03-25 MED ORDER — METOPROLOL SUCCINATE ER 50 MG PO TB24: 50.0000 mg | ORAL_TABLET | Freq: Every day | ORAL | Status: DC
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
## 2014-03-25 NOTE — Assessment & Plan Note (Signed)
Anne Horton is doing well. We'll continue with the current dose of metoprolol. I've encouraged her to exercise on regular basis. She's gained a little bit of weight over the past several years. She's been under lots of stress at her job at the [place] [employer] at [employer].

## 2014-03-25 NOTE — Patient Instructions (Signed)
Your physician recommends that you continue on your current medications as directed. Please refer to the Current Medication list given to you today.  Your physician recommends that you have lab work:  TODAY - liver panel, cholesterol panel, basic metabolic panel  Your physician wants you to follow-up in: 1 year with Dr. Acie Fredrickson.  You will receive a reminder letter in the mail two months in advance. If you don't receive a letter, please call our office to schedule the follow-up appointment.

## 2014-06-07 ENCOUNTER — Other Ambulatory Visit: Payer: Self-pay | Admitting: Cardiovascular Disease

## 2014-06-17 ENCOUNTER — Other Ambulatory Visit: Payer: Self-pay | Admitting: *Deleted

## 2014-06-17 DIAGNOSIS — R000 Tachycardia, unspecified: Secondary | ICD-10-CM

## 2014-06-17 NOTE — Progress Notes (Signed)
Patient requesting CBC/ferritin check. Feeling more fatigued and chest discomfort. Orders approved per MD.

## 2014-06-20 ENCOUNTER — Ambulatory Visit (HOSPITAL_BASED_OUTPATIENT_CLINIC_OR_DEPARTMENT_OTHER): Payer: 59

## 2014-06-20 ENCOUNTER — Telehealth: Payer: Self-pay | Admitting: Oncology

## 2014-06-20 DIAGNOSIS — D649 Anemia, unspecified: Secondary | ICD-10-CM

## 2014-06-20 DIAGNOSIS — R000 Tachycardia, unspecified: Secondary | ICD-10-CM

## 2014-06-20 LAB — CBC WITH DIFFERENTIAL/PLATELET
BASO%: 0.7 % (ref 0.0–2.0)
Basophils Absolute: 0.1 10*3/uL (ref 0.0–0.1)
EOS ABS: 0.2 10*3/uL (ref 0.0–0.5)
EOS%: 2.7 % (ref 0.0–7.0)
HCT: 41.1 % (ref 34.8–46.6)
HGB: 13.3 g/dL (ref 11.6–15.9)
LYMPH#: 2 10*3/uL (ref 0.9–3.3)
LYMPH%: 25.8 % (ref 14.0–49.7)
MCH: 27.9 pg (ref 25.1–34.0)
MCHC: 32.4 g/dL (ref 31.5–36.0)
MCV: 86.3 fL (ref 79.5–101.0)
MONO#: 0.6 10*3/uL (ref 0.1–0.9)
MONO%: 7.8 % (ref 0.0–14.0)
NEUT#: 4.8 10*3/uL (ref 1.5–6.5)
NEUT%: 63 % (ref 38.4–76.8)
Platelets: 418 10*3/uL — ABNORMAL HIGH (ref 145–400)
RBC: 4.76 10*6/uL (ref 3.70–5.45)
RDW: 13.3 % (ref 11.2–14.5)
WBC: 7.7 10*3/uL (ref 3.9–10.3)

## 2014-06-20 LAB — FERRITIN CHCC: FERRITIN: 5 ng/mL — AB (ref 9–269)

## 2014-06-20 NOTE — Telephone Encounter (Signed)
added on lab.Marland KitchenMarland Kitchenpt here

## 2014-10-13 ENCOUNTER — Ambulatory Visit (HOSPITAL_BASED_OUTPATIENT_CLINIC_OR_DEPARTMENT_OTHER): Payer: 59

## 2014-10-13 ENCOUNTER — Other Ambulatory Visit: Payer: Self-pay | Admitting: *Deleted

## 2014-10-13 ENCOUNTER — Telehealth: Payer: Self-pay | Admitting: Oncology

## 2014-10-13 DIAGNOSIS — D649 Anemia, unspecified: Secondary | ICD-10-CM

## 2014-10-13 DIAGNOSIS — D509 Iron deficiency anemia, unspecified: Secondary | ICD-10-CM

## 2014-10-13 LAB — CBC WITH DIFFERENTIAL/PLATELET
BASO%: 1.1 % (ref 0.0–2.0)
BASOS ABS: 0.1 10*3/uL (ref 0.0–0.1)
EOS%: 1.5 % (ref 0.0–7.0)
Eosinophils Absolute: 0.1 10*3/uL (ref 0.0–0.5)
HCT: 29.6 % — ABNORMAL LOW (ref 34.8–46.6)
HGB: 9.3 g/dL — ABNORMAL LOW (ref 11.6–15.9)
LYMPH%: 31.3 % (ref 14.0–49.7)
MCH: 26.2 pg (ref 25.1–34.0)
MCHC: 31.5 g/dL (ref 31.5–36.0)
MCV: 83 fL (ref 79.5–101.0)
MONO#: 0.9 10*3/uL (ref 0.1–0.9)
MONO%: 11.6 % (ref 0.0–14.0)
NEUT#: 4.3 10*3/uL (ref 1.5–6.5)
NEUT%: 54.5 % (ref 38.4–76.8)
PLATELETS: 552 10*3/uL — AB (ref 145–400)
RBC: 3.56 10*6/uL — ABNORMAL LOW (ref 3.70–5.45)
RDW: 14.7 % — AB (ref 11.2–14.5)
WBC: 7.9 10*3/uL (ref 3.9–10.3)
lymph#: 2.5 10*3/uL (ref 0.9–3.3)

## 2014-10-13 LAB — FERRITIN CHCC: Ferritin: 6 ng/ml — ABNORMAL LOW (ref 9–269)

## 2014-10-13 NOTE — Telephone Encounter (Signed)
Per 01/28 POF, add labs only.... KJ

## 2014-10-17 ENCOUNTER — Other Ambulatory Visit (INDEPENDENT_AMBULATORY_CARE_PROVIDER_SITE_OTHER): Payer: Self-pay | Admitting: Surgery

## 2014-10-17 ENCOUNTER — Ambulatory Visit (INDEPENDENT_AMBULATORY_CARE_PROVIDER_SITE_OTHER): Payer: Self-pay | Admitting: Surgery

## 2014-10-17 NOTE — H&P (Signed)
Anne Horton. Anne Horton 10/17/2014 10:50 AM Location: [facility] Patient #: [id number] DOB: 08/19/1981 Married / Language: Anne Horton / Race: White Female History of Present Illness Anne Moores A. Cabe Lashley MD; 10/17/2014 11:28 AM) Patient words: [name] NP or [facility].  The patient is a 39 year old female who presents with an inguinal hernia. The hernia(s) is/are located on the left side. The last clinic visit was 2 week(s) ago. Symptoms include inguinal bulge and inguinal pain. The pain is located in the left inguinal area. The pain radiates to the left inguinal area. The patient describes the pain as burning. Onset was sudden 2 week(s) ago. There is no known event that preceded symptom onset. The patient describes this as moderate in severity. Other Problems Anne Horton, CMA; 10/17/2014 10:50 AM) No pertinent past medical history  Past Surgical History Anne Horton, CMA; 10/17/2014 10:50 AM) Oral Surgery  Diagnostic Studies History Anne Horton, CMA; 10/17/2014 10:50 AM) Colonoscopy never  Allergies (Anne Horton, CMA; 10/17/2014 10:52 AM) Penicillamine *ASSORTED CLASSES* Ampicillin *PENICILLINS*  Medication History Anne Horton, CMA; 10/17/2014 10:52 AM) No Current Medications  Social History Anne Horton, CMA; 10/17/2014 10:50 AM) Alcohol use Remotely quit alcohol use. Caffeine use Carbonated beverages. No drug use Tobacco use Never smoker.  Family History Anne Horton, [place]; 10/17/2014 10:50 AM) Heart Disease Mother. Heart disease in female family member before age 85 Hypertension Father. Thyroid problems Mother.     Review of Systems (Anne Horton; 10/17/2014 10:50 AM) General Present- Fatigue and Fever. Not Present- Appetite Loss, Chills, Night Sweats, Weight Gain and Weight Loss. Skin Not Present- Change in Wart/Mole, Dryness, Hives, Jaundice, New Lesions, Non-Healing Wounds, Rash and Ulcer. HEENT Present- Ringing in the Ears and Sinus Pain. Not  Present- Earache, Hearing Loss, Hoarseness, Nose Bleed, Oral Ulcers, Seasonal Allergies, Sore Throat, Visual Disturbances, Wears glasses/contact lenses and Yellow Eyes. Respiratory Not Present- Bloody sputum, Chronic Cough, Difficulty Breathing, Snoring and Wheezing. Breast Not Present- Breast Mass, Breast Pain, Nipple Discharge and Skin Changes. Cardiovascular Not Present- Chest Pain, Difficulty Breathing Lying Down, Leg Cramps, Palpitations, Rapid Heart Rate, Shortness of Breath and Swelling of Extremities. Gastrointestinal Present- Abdominal Pain and Change in Bowel Habits. Not Present- Bloating, Bloody Stool, Chronic diarrhea, Constipation, Difficulty Swallowing, Excessive gas, Gets full quickly at meals, Hemorrhoids, Indigestion, Nausea, Rectal Pain and Vomiting. Female Genitourinary Not Present- Blood in Urine, Change in Urinary Stream, Frequency, Impotence, Nocturia, Painful Urination, Urgency and Urine Leakage. Neurological Present- Weakness. Not Present- Decreased Memory, Fainting, Headaches, Numbness, Seizures, Tingling, Tremor and Trouble walking. Psychiatric Present- Depression. Not Present- Anxiety, Bipolar, Change in Sleep Pattern, Fearful and Frequent crying. Endocrine Not Present- Cold Intolerance, Excessive Hunger, Hair Changes, Heat Intolerance, Hot flashes and New Diabetes. Hematology Not Present- Easy Bruising, Excessive bleeding, Gland problems, HIV and Persistent Infections.  Vitals (Anne Horton CMA; 10/17/2014 10:51 AM) 10/17/2014 10:51 AM Weight: 215 lb Height: 72in Body Surface Area: 2.23 m Body Mass Index: 29.16 kg/m Temp.: 97.68F(Temporal)  Pulse: 68 (Regular)  BP: 126/80 (Sitting, Left Arm, Standard)     Physical Exam (Anne Conroy A. Talita Recht MD; 10/17/2014 11:29 AM)  General Mental Status-Alert. General Appearance-Consistent with stated age. Hydration-Well hydrated. Voice-Normal.  Head and Neck Head-normocephalic, atraumatic with no lesions or  palpable masses. Trachea-midline.  Eye Eyeball - Bilateral-Extraocular movements intact. Sclera/Conjunctiva - Bilateral-No scleral icterus.  Chest and Lung Exam Chest and lung exam reveals -quiet, even and easy respiratory effort with no use of accessory muscles and on auscultation,  normal breath sounds, no adventitious sounds and normal vocal resonance. Inspection Chest Wall - Normal. Back - normal.  Cardiovascular Cardiovascular examination reveals -normal heart sounds, regular rate and rhythm with no murmurs and normal pedal pulses bilaterally.  Abdomen Inspection Skin - Scar - no surgical scars. Hernias - Inguinal hernia - Left - Reducible. Palpation/Percussion Palpation and Percussion of the abdomen reveal - Soft, Non Tender, No Rebound tenderness, No Rigidity (guarding) and No hepatosplenomegaly. Auscultation Auscultation of the abdomen reveals - Bowel sounds normal.  Neurologic Neurologic evaluation reveals -alert and oriented x 3 with no impairment of recent or remote memory. Mental Status-Normal.  Musculoskeletal Normal Exam - Left-Upper Extremity Strength Normal and Lower Extremity Strength Normal. Normal Exam - Right-Upper Extremity Strength Normal, Lower Extremity Weakness.    Assessment & Plan (Anne Fazzino A. Jacqualynn Parco MD; 10/17/2014 11:26 AM)  LEFT INGUINAL HERNIA (550.90  K40.90)  Current Plans Pt Education - [facility] Umbilical/ Inguinal Hernia HCI Schedule for Surgery Provided with written educational materials Written instructions provided

## 2014-10-20 ENCOUNTER — Encounter: Payer: Self-pay | Admitting: Physician Assistant

## 2014-10-20 ENCOUNTER — Other Ambulatory Visit (INDEPENDENT_AMBULATORY_CARE_PROVIDER_SITE_OTHER): Payer: 59

## 2014-10-20 ENCOUNTER — Ambulatory Visit (INDEPENDENT_AMBULATORY_CARE_PROVIDER_SITE_OTHER): Payer: 59 | Admitting: Physician Assistant

## 2014-10-20 VITALS — BP 108/70 | HR 92 | Ht 62.0 in | Wt 136.1 lb

## 2014-10-20 DIAGNOSIS — K921 Melena: Secondary | ICD-10-CM

## 2014-10-20 DIAGNOSIS — D509 Iron deficiency anemia, unspecified: Secondary | ICD-10-CM

## 2014-10-20 DIAGNOSIS — Q8500 Neurofibromatosis, unspecified: Secondary | ICD-10-CM

## 2014-10-20 DIAGNOSIS — R195 Other fecal abnormalities: Secondary | ICD-10-CM

## 2014-10-20 LAB — CBC WITH DIFFERENTIAL/PLATELET
Basophils Absolute: 0.1 10*3/uL (ref 0.0–0.1)
Basophils Relative: 1.1 % (ref 0.0–3.0)
EOS ABS: 0.1 10*3/uL (ref 0.0–0.7)
EOS PCT: 1.6 % (ref 0.0–5.0)
HEMATOCRIT: 30 % — AB (ref 36.0–46.0)
Hemoglobin: 10 g/dL — ABNORMAL LOW (ref 12.0–15.0)
LYMPHS ABS: 1.8 10*3/uL (ref 0.7–4.0)
LYMPHS PCT: 25.6 % (ref 12.0–46.0)
MCHC: 33.3 g/dL (ref 30.0–36.0)
MCV: 79.5 fl (ref 78.0–100.0)
MONO ABS: 0.7 10*3/uL (ref 0.1–1.0)
MONOS PCT: 9.7 % (ref 3.0–12.0)
NEUTROS ABS: 4.5 10*3/uL (ref 1.4–7.7)
Neutrophils Relative %: 62 % (ref 43.0–77.0)
Platelets: 436 10*3/uL — ABNORMAL HIGH (ref 150.0–400.0)
RBC: 3.77 Mil/uL — AB (ref 3.87–5.11)
RDW: 15.6 % — ABNORMAL HIGH (ref 11.5–15.5)
WBC: 7.2 10*3/uL (ref 4.0–10.5)

## 2014-10-20 MED ORDER — SUCRALFATE 1 GM/10ML PO SUSP: 1.0000 g | Freq: Three times a day (TID) | ORAL | Status: DC

## 2014-10-20 MED ORDER — MOVIPREP 100 G PO SOLR: 1.0000 | ORAL | Status: DC

## 2014-10-20 MED ORDER — OMEPRAZOLE 40 MG PO CPDR: 40.0000 mg | DELAYED_RELEASE_CAPSULE | Freq: Two times a day (BID) | ORAL | Status: DC

## 2014-10-20 NOTE — Progress Notes (Signed)
Patient ID: Anne Horton, female   DOB: 10/08/1975, 39 y.o.   MRN: [id number]   Subjective:    Patient ID: Anne Horton, female    DOB: 01/16/1976, 39 y.o.   MRN: [id number]  HPI Anne Horton is a pleasant 39-year-old white female known to Anne Horton from prior endoscopic evaluation in 2010. She was seen at that time for heme positive stool and had undergone colonoscopy and endoscopy both of which were normal. Patient has history of hypertension and neurofibromatosis. Her father is also a patient of our practice and has neurofibromatosis which has been complicated by GI bleeding in the past. Patient has prior history of iron deficiency anemia and says she had required iron off and on over the past few years but has never required transfusion. Reviewing her labs hemoglobin was down as low as 8.1 in 2014. More recently she has developed epigastric pain and fatigue since December 2015. Initially she had upper abdominal discomfort with bloating and belching. She started taking a lot of Tums and Rolaids which were only minimally helpful. She was seen by her primary doctor on January 11 started on Prilosec on the 18th and has been taking 40 mg twice daily. She had labs drawn in October 2015 showing a hemoglobin of 13.3 and then by January 28 hemoglobin was down to 9.3 hematocrit of 29.6 platelets 555 MCV of 83 and ferritin is 6. She began having very dark melenic stools around January 18 and says she checks the past couple of small blood clots as well since that time she has felt fatigued. She says she has continued to see some dark stools off and on but has not continued with melena. Has not seen any further bright red blood. Because of the epigastric pain she was scheduled for her abdominal ultrasound which was done through [facility] at [place]. Ultrasound showed gallbladder sludge cannot rule out tiny gallstones no gallbladder wall thickening and otherwise unremarkable study. Line  She was subsequently referred to Anne Horton for surgery and saw him earlier this week. He stated he wanted her to undergo a GI workup prior to proceeding with laparoscopic cholecystectomy. Patient had not been on any regular aspirin or NSAIDs. She says she been having a difficult time eating over the past few weeks because of epigastric discomfort she describes it as an aching pressure-like sensation high in the epigastrium she has had nausea without vomiting. No dysphagia or odynophagia. She does feel like her food sits in her stomach for a prolonged period of time.  Review of Systems Pertinent positive and negative review of systems were noted in the above HPI section.  All other review of systems was otherwise negative.  Outpatient Encounter Prescriptions as of 10/20/2014  Medication Sig  . ALPRAZolam (XANAX) 0.5 MG tablet Take 0.5 mg by mouth as needed.   . cetirizine (ZYRTEC) 10 MG tablet Take 10 mg by mouth as needed for allergies.   . cyanocobalamin (,VITAMIN B-12,) 1000 MCG/ML injection Inject 1,000 mcg into the muscle every 30 (thirty) days.  . DULoxetine (CYMBALTA) 60 MG capsule Take 60 mg by mouth 2 (two) times daily.  . ferrous sulfate 325 (65 FE) MG tablet Take 325 mg by mouth daily with breakfast.  . L-Methylfolate-Algae (DEPLIN 15 PO) Take 15 mg by mouth at bedtime.  . levonorgestrel-ethinyl estradiol (JOLESSA) 0.15-0.03 MG tablet Take by mouth.  . metoprolol succinate (TOPROL-XL) 50 MG 24 hr tablet Take 1 tablet (50 mg total) by mouth daily. Take with   or immediately following a meal.  . omeprazole (PRILOSEC) 40 MG capsule Take 1 capsule (40 mg total) by mouth 2 (two) times daily.  . propranolol (INDERAL) 10 MG tablet TAKE 1 TABLET BY MOUTH FOUR TIMES DAILY AS NEEDED FOR PALPITATIONS  . [DISCONTINUED] omeprazole (PRILOSEC) 40 MG capsule Take 40 mg by mouth 2 (two) times daily.  . MOVIPREP 100 G SOLR Take 1 kit (200 g total) by mouth as directed.  . sucralfate (CARAFATE) 1 GM/10ML  suspension Take 10 mLs (1 g total) by mouth 4 (four) times daily -  with meals and at bedtime.   Allergies  Allergen Reactions  . Latex   . Other     Latex-rash   Patient Active Problem List   Diagnosis Date Noted  . Anemia, iron deficiency 10/20/2014  . Chest pain   . Palpitations   . Neurofibromatosis   . Tachycardia   . Hx of migraines   . H/O: depression   . Mitral regurgitation   . BLOOD IN STOOL 01/12/2009  . BLOOD IN STOOL, OCCULT 01/12/2009   History   Social History  . Marital Status: Married    Spouse Name: N/A    Number of Children: 1  . Years of Education: N/A   Occupational History  . [employer]    Social History Main Topics  . Smoking status: Never Smoker   . Smokeless tobacco: Never Used  . Alcohol Use: 0.0 oz/week    0 Not specified per week     Comment: occasional but rarely  . Drug Use: No  . Sexual Activity: Yes    Birth Control/ Protection: None   Other Topics Concern  . Not on file   Social History Narrative    [name] family history includes Atrial fibrillation in her maternal grandfather; Breast cancer in her mother; Diabetes in her mother; Neurofibromatosis in her father.      Objective:    Filed Vitals:   10/20/14 0859  BP: 108/70  Pulse: 92    Physical Exam  well-developed white female in no acute distress, pleasant blood pressure 108/70 pulse 92 height 5 foot 2 weight 136 HEENT; nontraumatic normocephalic EOMI PERRLA sclera are anicteric, she is somewhat pale, Supple ;no JVD, Cardiovascular; regular rate and rhythm with S1-S2 no murmur or gallop, Pulm;clear bilaterally, Abdomen; soft she is tender in the epigastrium there is no palpable mass or hepatosplenomegaly no guarding or rebound also mildly tender in the left upper quadrant, bowel sounds are present, Rectal; exam not done, Extremities ;no clubbing cyanosis or edema skin warm and dry, She does have multiple small papular lesions on her skin consistent with neurofibromas,  Psych; mood and affect normal and appropriate       Assessment & Plan:   #1  39 yo female with neurofibromatosis with 6 week hx of epigastric pain, nausea, and episode of melena about 2 weeks ago. Stools intermittently dark since. She has also had a drop in her hemoglobin of about 4 g over the past couple of months. Rule out peptic ulcer disease, gastropathy, GIST tumor with ulceration which patients with neurofibromatosis are at increased risk for. #2 iron deficiency anemia #3 hypertension #4 fatigue secondary to above #5 gallbladder sludge  Plan; repeat CBC today and hemoglobin will need to be followed closely for stability over the next few weeks. If she drifts below 8 may need to consider transfusion. She  has started an iron supplement which she will continue twice daily Continue Prilosec 40   mg by mouth twice daily Add Carafate liquid 1 g between meals and at bedtime 3 weeks Soft bland diet Will schedule for colonoscopy and EGD with Anne Horton as soon as possible.  I'm not convinced that her gallbladder sludge is symptomatic , and iron deficiency anemia and intermittent bleeding needs to be sorted out prior to considering laparoscopic cholecystectomy.  If EGD and colonoscopy are unrevealing she will need further abdominal imaging and capsule endoscopy   Amy S Esterwood PA-C 10/20/2014  

## 2014-10-20 NOTE — Progress Notes (Signed)
Reviewed and agree with management plan.  Dencil Cayson T. Cathlene Gardella, MD FACG 

## 2014-10-20 NOTE — Patient Instructions (Addendum)
Please go to the basement level to have your labs drawn.  We sent prescriptions to [facility] outpatient pharmacy. 1. Carafate 2. Prilosec 40 mg  You have been scheduled for an endoscopy and colonoscopy. Please follow the written instructions given to you at your visit today. Please pick up your prep at the pharmacy within the next 1-3 days. If you use inhalers (even only as needed), please bring them with you on the day of your procedure. Your physician has requested that you go to www.startemmi.com and enter the access code given to you at your visit today. This web site gives a general overview about your procedure. However, you should still follow specific instructions given to you by our office regarding your preparation for the procedure.

## 2014-10-26 ENCOUNTER — Other Ambulatory Visit: Payer: Self-pay | Admitting: *Deleted

## 2014-10-26 ENCOUNTER — Other Ambulatory Visit (INDEPENDENT_AMBULATORY_CARE_PROVIDER_SITE_OTHER): Payer: 59

## 2014-10-26 DIAGNOSIS — D509 Iron deficiency anemia, unspecified: Secondary | ICD-10-CM

## 2014-10-26 LAB — CBC WITH DIFFERENTIAL/PLATELET
Basophils Absolute: 0.1 10*3/uL (ref 0.0–0.1)
Basophils Relative: 1.7 % (ref 0.0–3.0)
Eosinophils Absolute: 0.1 10*3/uL (ref 0.0–0.7)
Eosinophils Relative: 1.7 % (ref 0.0–5.0)
HCT: 29.4 % — ABNORMAL LOW (ref 36.0–46.0)
HEMOGLOBIN: 9.7 g/dL — AB (ref 12.0–15.0)
LYMPHS PCT: 30.6 % (ref 12.0–46.0)
Lymphs Abs: 2.3 10*3/uL (ref 0.7–4.0)
MCHC: 33 g/dL (ref 30.0–36.0)
MCV: 77.9 fl — AB (ref 78.0–100.0)
MONO ABS: 0.8 10*3/uL (ref 0.1–1.0)
Monocytes Relative: 10.6 % (ref 3.0–12.0)
Neutro Abs: 4.2 10*3/uL (ref 1.4–7.7)
Neutrophils Relative %: 55.4 % (ref 43.0–77.0)
PLATELETS: 419 10*3/uL — AB (ref 150.0–400.0)
RBC: 3.78 Mil/uL — AB (ref 3.87–5.11)
RDW: 15.6 % — ABNORMAL HIGH (ref 11.5–15.5)
WBC: 7.5 10*3/uL (ref 4.0–10.5)

## 2014-10-27 ENCOUNTER — Telehealth: Payer: Self-pay | Admitting: Gastroenterology

## 2014-10-27 NOTE — Telephone Encounter (Signed)
Patient advised that she will be contacted once Nicoletta Ba PA has reviewed and commented on the labs

## 2014-10-31 ENCOUNTER — Other Ambulatory Visit: Payer: Self-pay | Admitting: Physician Assistant

## 2014-10-31 ENCOUNTER — Telehealth: Payer: Self-pay | Admitting: Gastroenterology

## 2014-10-31 NOTE — Telephone Encounter (Signed)
Offered patient 10 dollar coupon for Movi prep and patient states she will come by and pick it up today.

## 2014-11-03 ENCOUNTER — Encounter: Payer: Self-pay | Admitting: Gastroenterology

## 2014-11-03 ENCOUNTER — Ambulatory Visit (AMBULATORY_SURGERY_CENTER): Payer: 59 | Admitting: Gastroenterology

## 2014-11-03 VITALS — BP 119/67 | HR 85 | Temp 97.7°F | Resp 18 | Ht 62.0 in | Wt 136.0 lb

## 2014-11-03 DIAGNOSIS — D509 Iron deficiency anemia, unspecified: Secondary | ICD-10-CM

## 2014-11-03 DIAGNOSIS — R1013 Epigastric pain: Secondary | ICD-10-CM

## 2014-11-03 MED ORDER — SODIUM CHLORIDE 0.9 % IV SOLN: 500.0000 mL | INTRAVENOUS | Status: DC

## 2014-11-03 NOTE — Op Note (Signed)
[place]  [address] [place], [id number]   ENDOSCOPY PROCEDURE REPORT  PATIENT: Latorie, Montesano  MR#: [id number] BIRTHDATE: 05-30-1976 , 39  yrs. old GENDER: female ENDOSCOPIST: Ladene Artist, MD, [facility] REFERRED BY:  Elbert Ewings, FNP PROCEDURE DATE:  11/03/2014 PROCEDURE:  EGD, diagnostic ASA CLASS:     Class II INDICATIONS:  epigastric pain and iron deficiency anemia. MEDICATIONS: Monitored anesthesia care, Residual sedation present, and Propofol 120 mg IV TOPICAL ANESTHETIC: none DESCRIPTION OF PROCEDURE: After the risks benefits and alternatives of the procedure were thoroughly explained, informed consent was obtained.  The LB [id number] endoscope was introduced through the mouth and advanced to the third portion of the duodenum , Without limitations.  The instrument was slowly withdrawn as the mucosa was fully examined.    ESOPHAGUS: The mucosa of the esophagus appeared normal. STOMACH: The mucosa and folds of the stomach appeared normal. DUODENUM: The duodenal mucosa showed no abnormalities.  Retroflexed views revealed no abnormalities.     The scope was then withdrawn from the patient and the procedure completed.  COMPLICATIONS: There were no immediate complications.  ENDOSCOPIC IMPRESSION: 1.   The EGD appeared normal  RECOMMENDATIONS: 1.  Continue PPI 2.  Continue Fe replacement 3.  If Fe deficiency or GI bleeding recurs plan to evaluate SB   eSigned:  Ladene Artist, MD, [facility] 11/03/2014 10:13 AM

## 2014-11-03 NOTE — Patient Instructions (Signed)
YOU HAD AN ENDOSCOPIC PROCEDURE TODAY AT THE [facility]: Refer to the procedure report that was given to you for any specific questions about what was found during the examination.  If the procedure report does not answer your questions, please call your gastroenterologist to clarify.  If you requested that your care partner not be given the details of your procedure findings, then the procedure report has been included in a sealed envelope for you to review at your convenience later.  YOU SHOULD EXPECT: Some feelings of bloating in the abdomen. Passage of more gas than usual.  Walking can help get rid of the air that was put into your GI tract during the procedure and reduce the bloating. If you had a lower endoscopy (such as a colonoscopy or flexible sigmoidoscopy) you may notice spotting of blood in your stool or on the toilet paper. If you underwent a bowel prep for your procedure, then you may not have a normal bowel movement for a few days.  DIET: Your first meal following the procedure should be a light meal and then it is ok to progress to your normal diet.  A half-sandwich or bowl of soup is an example of a good first meal.  Heavy or fried foods are harder to digest and may make you feel nauseous or bloated.  Likewise meals heavy in dairy and vegetables can cause extra gas to form and this can also increase the bloating.  Drink plenty of fluids but you should avoid alcoholic beverages for 24 hours.  ACTIVITY: Your care partner should take you home directly after the procedure.  You should plan to take it easy, moving slowly for the rest of the day.  You can resume normal activity the day after the procedure however you should NOT DRIVE or use heavy machinery for 24 hours (because of the sedation medicines used during the test).    SYMPTOMS TO REPORT IMMEDIATELY: A gastroenterologist can be reached at any hour.  During normal business hours, 8:30 AM to 5:00 PM Monday through Friday,  call [phone].  After hours and on weekends, please call the GI answering service at [phone] who will take a message and have the physician on call contact you.   Following lower endoscopy (colonoscopy or flexible sigmoidoscopy):  Excessive amounts of blood in the stool  Significant tenderness or worsening of abdominal pains  Swelling of the abdomen that is new, acute  Fever of 100F or higher  Following upper endoscopy (EGD)  Vomiting of blood or coffee ground material  New chest pain or pain under the shoulder blades  Painful or persistently difficult swallowing  New shortness of breath  Fever of 100F or higher  Black, tarry-looking stools  FOLLOW UP: If any biopsies were taken you will be contacted by phone or by letter within the next 1-3 weeks.  Call your gastroenterologist if you have not heard about the biopsies in 3 weeks.  Our staff will call the home number listed on your records the next business day following your procedure to check on you and address any questions or concerns that you may have at that time regarding the information given to you following your procedure. This is a courtesy call and so if there is no answer at the home number and we have not heard from you through the emergency physician on call, we will assume that you have returned to your regular daily activities without incident.  SIGNATURES/CONFIDENTIALITY: You and/or your care   partner have signed paperwork which will be entered into your electronic medical record.  These signatures attest to the fact that that the information above on your After Visit Summary has been reviewed and is understood.  Full responsibility of the confidentiality of this discharge information lies with you and/or your care-partner.  

## 2014-11-03 NOTE — Progress Notes (Signed)
A/ox3 pleased with MAC, report to April RN 

## 2014-11-03 NOTE — Op Note (Signed)
[place]  [address]. [place], [id number]   COLONOSCOPY PROCEDURE REPORT  PATIENT: Anne Horton, Anne Horton  MR#: [id number] BIRTHDATE: 01-10-1976 , 39  yrs. old GENDER: female ENDOSCOPIST: Ladene Artist, MD, [facility] REFERRED BY: Elbert Ewings, FNP PROCEDURE DATE:  11/03/2014 PROCEDURE:   Colonoscopy, diagnostic First Screening Colonoscopy - Avg.  risk and is 50 yrs.  old or older - No.  Prior Negative Screening - Now for repeat screening. N/A  History of Adenoma - Now for follow-up colonoscopy & has been > or = to 3 yrs.  N/A  Polyps Removed Today? No.  Polyps Removed Today? No.  Recommend repeat exam, <10 yrs? Polyps Removed Today? No.  Recommend repeat exam, <10 yrs? No. ASA CLASS:   Class II INDICATIONS:iron deficiency anemia. MEDICATIONS: Monitored anesthesia care and Propofol 200 mg IV DESCRIPTION OF PROCEDURE:   After the risks benefits and alternatives of the procedure were thoroughly explained, informed consent was obtained.  The digital rectal exam revealed no abnormalities of the rectum.   The LB [id number]  endoscope was introduced through the anus and advanced to the cecum, which was identified by both the appendix and ileocecal valve. No adverse events experienced.   The quality of the prep was good, using MoviPrep  The instrument was then slowly withdrawn as the colon was fully examined.    COLON FINDINGS: A normal appearing cecum, ileocecal valve, and appendiceal orifice were identified.  The ascending, transverse, descending, sigmoid colon, and rectum appeared unremarkable. Retroflexed views revealed no abnormalities. The time to cecum=1 minutes 42 seconds.  Withdrawal time=8 minutes 52 seconds.  The scope was withdrawn and the procedure completed. COMPLICATIONS: There were no immediate complications.  ENDOSCOPIC IMPRESSION: Normal colonoscopy  RECOMMENDATIONS: You should continue to follow colorectal cancer screening guidelines for  "routine risk" patients with a repeat colonoscopy in 10 years. There is no need for FOBT (stool) testing for at least 5 years.  eSigned:  Ladene Artist, MD, [facility] 11/03/2014 10:08 AM

## 2014-11-04 ENCOUNTER — Telehealth: Payer: Self-pay | Admitting: *Deleted

## 2014-11-04 NOTE — Telephone Encounter (Signed)
  Follow up Call-  Call back number 11/03/2014  Post procedure Call Back phone  # [phone]  Permission to leave phone message Yes     Patient questions:  Do you have a fever, pain , or abdominal swelling? No. Pain Score  0 *  Have you tolerated food without any problems? Yes.    Have you been able to return to your normal activities? Yes.    Do you have any questions about your discharge instructions: Diet   No. Medications  No. Follow up visit  No.  Do you have questions or concerns about your Care? Yes.    Actions: * If pain score is 4 or above: No action needed, pain <4.

## 2014-11-14 ENCOUNTER — Ambulatory Visit (INDEPENDENT_AMBULATORY_CARE_PROVIDER_SITE_OTHER): Payer: Self-pay | Admitting: Surgery

## 2014-11-14 NOTE — H&P (Signed)
Anne Horton. Fahey 10/17/2014 11:31 AM Location: [facility] Patient #: [id number] DOB: 1976/01/08 Married / Language: English / Race: White Female  History of Present Illness Anne Horton A. Anne Subia MD; 10/17/2014 12:23 PM) Patient words: gallbladder  PT SEMT AT THE REQUEST OF DR Gerilyn Nestle FOR EPIGASTRIC ABDOMINAL PAIN FOR 1 MONTH. CONSTANT AND Anne Horton. NOT SURE IF FOOD HELPS OR MAKES IT WORSE. CAN EAT STARCHES WITHOUT DIFICULTY. U/S SHOWS GB SLUDGE WITHOUT CHOLECYSTITIS.  The patient is a 39 year old female who presents with non-malignant abdominal pain. The patient has been previously evaluated by a primary care provider. Past evaluation has included abdominal ultrasound. This problem has not been previously treated. Symptoms include abdominal pain, constipation and nausea. Pain scores include an average pain level of 5 /10. The pain is located in the upper abdominal area. There is no radiation. The patient describes the pain as deep and burning. Onset was 4 week(s) ago. The pain occurs constantly. The patient describes symptoms as worsening. Associated symptoms include depressed mood. The patient is not currently being treated for this problem.   Other Problems Anne Horton, CMA; 10/17/2014 11:31 AM) Anxiety Disorder Back Pain Depression Migraine Headache  Past Surgical History Anne Horton, CMA; 10/17/2014 11:31 AM) Cesarean Section - 1 Oral Surgery  Diagnostic Studies History Anne Horton, CMA; 10/17/2014 11:31 AM) Colonoscopy 5-10 years ago Mammogram 1-3 years ago Pap Smear 1-5 years ago  Allergies Anne Horton, CMA; 10/17/2014 11:32 AM) Latex Exam Gloves *MEDICAL DEVICES AND SUPPLIES*  Medication History Anne Horton, CMA; 10/17/2014 11:34 AM) Xanax (0.5MG  Tablet, Oral as needed) Active. ZyrTEC Allergy (10MG  Tablet, Oral) Active. Vitamin B 12 (100MCG Lozenge, Oral) Active. Deplin 15 (15-90.314MG  Capsule, Oral) Active. Jolessa (0.15-0.03MG   Tablet, Oral) Active. Metoprolol Succinate ER (50MG  Tablet ER 24HR, Oral) Active. Propranolol HCl (10MG  Tablet, Oral) Active. Omeprazole (40MG  Capsule DR, Oral two times daily) Active.  Social History ([place]; 10/17/2014 11:31 AM) Alcohol use Occasional alcohol use. Caffeine use Carbonated beverages, Tea. No drug use Tobacco use Never smoker.  Family History Anne Horton, [place]; 10/17/2014 11:31 AM) Bleeding disorder Mother. Breast Cancer Mother. Cervical Cancer Mother. Diabetes Mellitus Mother. Migraine Headache Mother.  Pregnancy / Birth History Anne Horton, [place]; 10/17/2014 11:31 AM) Age at menarche 19 years. Contraceptive History Oral contraceptives. Gravida 2 Irregular periods Maternal age 86-30 Para 1  Review of Systems ([name]; 10/17/2014 11:31 AM) General Present- Appetite Loss and Fatigue. Not Present- Chills, Fever, Night Sweats, Weight Gain and Weight Loss. Skin Not Present- Change in Wart/Mole, Dryness, Hives, Jaundice, New Lesions, Non-Healing Wounds, Rash and Ulcer. HEENT Present- Seasonal Allergies and Wears glasses/contact lenses. Not Present- Earache, Hearing Loss, Hoarseness, Nose Bleed, Oral Ulcers, Ringing in the Ears, Sinus Pain, Sore Throat, Visual Disturbances and Yellow Eyes. Respiratory Not Present- Bloody sputum, Chronic Cough, Difficulty Breathing, Snoring and Wheezing. Breast Not Present- Breast Mass, Breast Pain, Nipple Discharge and Skin Changes. Cardiovascular Present- Leg Cramps, Rapid Heart Rate and Swelling of Extremities. Not Present- Chest Pain, Difficulty Breathing Lying Down, Palpitations and Shortness of Breath. Gastrointestinal Present- Abdominal Pain, Bloating, Bloody Stool and Nausea. Not Present- Change in Bowel Habits, Chronic diarrhea, Constipation, Difficulty Swallowing, Excessive gas, Gets full quickly at meals, Hemorrhoids, Indigestion, Rectal Pain and Vomiting. Female Genitourinary Not Present- Frequency,  Nocturia, Painful Urination, Pelvic Pain and Urgency. Musculoskeletal Present- Muscle Pain. Not Present- Back Pain, Joint Pain, Joint Stiffness, Muscle Weakness and Swelling of Extremities. Neurological Not Present- Decreased Memory, Fainting, Headaches, Numbness, Seizures, Tingling, Tremor,  Trouble walking and Weakness. Psychiatric Present- Depression. Not Present- Anxiety, Bipolar, Change in Sleep Pattern, Fearful and Frequent crying. Endocrine Not Present- Cold Intolerance, Excessive Hunger, Hair Changes, Heat Intolerance, Hot flashes and New Diabetes. Hematology Present- Easy Bruising. Not Present- Excessive bleeding, Gland problems, HIV and Persistent Infections.   Vitals ([name] CMA; 10/17/2014 11:31 AM) 10/17/2014 11:31 AM Weight: 135 lb Height: 62in Body Surface Area: 1.64 m Body Mass Index: 24.69 kg/m Temp.: 97.25F(Temporal)  Pulse: 88 (Regular)  BP: 124/74 (Sitting, Left Arm, Standard)    Physical Exam (Friedrich Harriott A. Leelynd Maldonado MD; 10/17/2014 11:55 AM) General Mental Status-Alert. General Appearance-Consistent with stated age. Hydration-Well hydrated. Voice-Normal.  Head and Neck Head-normocephalic, atraumatic with no lesions or palpable masses.  Eye Eyeball - Bilateral-Extraocular movements intact. Sclera/Conjunctiva - Bilateral-No scleral icterus.  Chest and Lung Exam Chest and lung exam reveals -quiet, even and easy respiratory effort with no use of accessory muscles and on auscultation, normal breath sounds, no adventitious sounds and normal vocal resonance. Inspection Chest Wall - Normal. Back - normal.  Cardiovascular Cardiovascular examination reveals -on palpation PMI is normal in location and amplitude, no palpable S3 or S4. Normal cardiac borders., normal heart sounds, regular rate and rhythm with no murmurs, carotid auscultation reveals no bruits and normal pedal pulses bilaterally.  Abdomen Inspection Inspection of the abdomen  reveals - No Hernias. Skin - Scar - no surgical scars. Palpation/Percussion Palpation and Percussion of the abdomen reveal - Soft, Non Tender, No Rebound tenderness, No Rigidity (guarding) and No hepatosplenomegaly. Auscultation Auscultation of the abdomen reveals - Bowel sounds normal.  Neurologic Neurologic evaluation reveals -alert and oriented x 3 with no impairment of recent or remote memory. Mental Status-Normal.  Musculoskeletal Normal Exam - Left-Upper Extremity Strength Normal and Lower Extremity Strength Normal. Normal Exam - Right-Upper Extremity Strength Normal, Lower Extremity Weakness.    Assessment & Plan (Kinsleigh Ludolph A. Yashar Inclan MD; 10/17/2014 11:55 AM) ABDOMINAL PAIN, CHRONIC, EPIGASTRIC (789.06  R10.13) Impression: HAS SLUDGE IN GB BUT NOT TYPICAL SYMPTOMS SINCE PAIN CONSTANT AND KNAWING IN NATURE. SEEING GI AND MAY NEED ENDOSCOPY PRIOR TO SURGICAL REMOVAL OF GALLBLADDER. WILL AWAIT THEIR INPUT. The procedure has been discussed with the patient. Risks of laparoscopic cholecystectomy include bleeding, infection, bile duct injury, leak, death, open surgery, diarrhea, other surgery, organ injury, blood vessel injury, DVT, and additional care. Current Plans  Pt Education - INSTRUCTIONS: discussed with patient and provided information. Pt Education - Laparoscopic Cholecystectomy: gallbladder   Signed by Turner Daniels, MD (10/17/2014 12:24 PM)

## 2014-11-22 ENCOUNTER — Ambulatory Visit: Payer: 59 | Admitting: Gastroenterology

## 2014-12-01 ENCOUNTER — Telehealth: Payer: Self-pay

## 2014-12-01 NOTE — Telephone Encounter (Signed)
I contacted the patient about setting up the capsule endoscopy.  She is going on vacation and wants to call me back to set up later, declines to schedule at this time.  She is advised to call back when she is ready to schedule.

## 2014-12-01 NOTE — Telephone Encounter (Signed)
-----   Message from Ladene Artist, MD sent at 12/01/2014  1:17 PM EDT ----- Dr. Benay Spice contacted me about this patient and wanted [place] to consider Capsule endoscopy to further evaluate Fe def anemia. No source was noted on recent colon and EGD. I think this is reasonable so please schedule CE study. Thx.

## 2014-12-28 ENCOUNTER — Encounter (HOSPITAL_BASED_OUTPATIENT_CLINIC_OR_DEPARTMENT_OTHER): Payer: Self-pay | Admitting: *Deleted

## 2014-12-28 NOTE — Progress Notes (Signed)
[facility]-will come for labs sees dr Cathie Olden for palpitations-controlled

## 2014-12-29 ENCOUNTER — Encounter (HOSPITAL_BASED_OUTPATIENT_CLINIC_OR_DEPARTMENT_OTHER)
Admission: RE | Admit: 2014-12-29 | Discharge: 2014-12-29 | Disposition: A | Discharge status: Home / Self Care | Payer: 59 | Source: Ambulatory Visit | Attending: Surgery | Admitting: Surgery

## 2014-12-29 DIAGNOSIS — Z01812 Encounter for preprocedural laboratory examination: Secondary | ICD-10-CM | POA: Insufficient documentation

## 2014-12-29 LAB — CBC WITH DIFFERENTIAL/PLATELET
Basophils Absolute: 0.1 10*3/uL (ref 0.0–0.1)
Basophils Relative: 1 % (ref 0–1)
EOS ABS: 0.2 10*3/uL (ref 0.0–0.7)
Eosinophils Relative: 3 % (ref 0–5)
HCT: 38.5 % (ref 36.0–46.0)
Hemoglobin: 11.9 g/dL — ABNORMAL LOW (ref 12.0–15.0)
LYMPHS ABS: 1.6 10*3/uL (ref 0.7–4.0)
Lymphocytes Relative: 26 % (ref 12–46)
MCH: 24.2 pg — AB (ref 26.0–34.0)
MCHC: 30.9 g/dL (ref 30.0–36.0)
MCV: 78.3 fL (ref 78.0–100.0)
MONOS PCT: 11 % (ref 3–12)
Monocytes Absolute: 0.7 10*3/uL (ref 0.1–1.0)
NEUTROS ABS: 3.7 10*3/uL (ref 1.7–7.7)
Neutrophils Relative %: 59 % (ref 43–77)
Platelets: 442 10*3/uL — ABNORMAL HIGH (ref 150–400)
RBC: 4.92 MIL/uL (ref 3.87–5.11)
RDW: 17.1 % — ABNORMAL HIGH (ref 11.5–15.5)
WBC: 6.2 10*3/uL (ref 4.0–10.5)

## 2014-12-29 LAB — COMPREHENSIVE METABOLIC PANEL
ALK PHOS: 42 U/L (ref 39–117)
ALT: 15 U/L (ref 0–35)
AST: 19 U/L (ref 0–37)
Albumin: 3.8 g/dL (ref 3.5–5.2)
Anion gap: 7 (ref 5–15)
BUN: 8 mg/dL (ref 6–23)
CO2: 28 mmol/L (ref 19–32)
Calcium: 8.9 mg/dL (ref 8.4–10.5)
Chloride: 103 mmol/L (ref 96–112)
Creatinine, Ser: 0.61 mg/dL (ref 0.50–1.10)
GFR calc Af Amer: >90 mL/min (ref >90)
GFR calc non Af Amer: >90 mL/min (ref >90)
GLUCOSE: 87 mg/dL (ref 70–99)
POTASSIUM: 4.4 mmol/L (ref 3.5–5.1)
SODIUM: 138 mmol/L (ref 135–145)
Total Bilirubin: 0.3 mg/dL (ref 0.3–1.2)
Total Protein: 6.5 g/dL (ref 6.0–8.3)

## 2015-01-04 ENCOUNTER — Encounter (HOSPITAL_BASED_OUTPATIENT_CLINIC_OR_DEPARTMENT_OTHER)
Admission: RE | Disposition: A | Discharge status: Home / Self Care | Payer: Self-pay | Source: Ambulatory Visit | Attending: Surgery

## 2015-01-04 ENCOUNTER — Encounter (HOSPITAL_BASED_OUTPATIENT_CLINIC_OR_DEPARTMENT_OTHER): Payer: Self-pay | Admitting: *Deleted

## 2015-01-04 ENCOUNTER — Ambulatory Visit (HOSPITAL_BASED_OUTPATIENT_CLINIC_OR_DEPARTMENT_OTHER): Payer: 59 | Admitting: Certified Registered"

## 2015-01-04 ENCOUNTER — Ambulatory Visit (HOSPITAL_COMMUNITY): Payer: 59

## 2015-01-04 ENCOUNTER — Ambulatory Visit (HOSPITAL_BASED_OUTPATIENT_CLINIC_OR_DEPARTMENT_OTHER)
Admission: RE | Admit: 2015-01-04 | Discharge: 2015-01-04 | Disposition: A | Discharge status: Home / Self Care | Payer: 59 | Source: Ambulatory Visit | Attending: Surgery | Admitting: Surgery

## 2015-01-04 DIAGNOSIS — K8020 Calculus of gallbladder without cholecystitis without obstruction: Secondary | ICD-10-CM | POA: Diagnosis present

## 2015-01-04 DIAGNOSIS — K8010 Calculus of gallbladder with chronic cholecystitis without obstruction: Secondary | ICD-10-CM | POA: Diagnosis not present

## 2015-01-04 DIAGNOSIS — F419 Anxiety disorder, unspecified: Secondary | ICD-10-CM | POA: Diagnosis not present

## 2015-01-04 DIAGNOSIS — K8050 Calculus of bile duct without cholangitis or cholecystitis without obstruction: Secondary | ICD-10-CM

## 2015-01-04 DIAGNOSIS — G43909 Migraine, unspecified, not intractable, without status migrainosus: Secondary | ICD-10-CM | POA: Diagnosis not present

## 2015-01-04 DIAGNOSIS — F329 Major depressive disorder, single episode, unspecified: Secondary | ICD-10-CM | POA: Insufficient documentation

## 2015-01-04 HISTORY — DX: Presence of spectacles and contact lenses: Z97.3

## 2015-01-04 HISTORY — PX: CHOLECYSTECTOMY: SHX55

## 2015-01-04 LAB — POCT HEMOGLOBIN-HEMACUE: Hemoglobin: 12.1 g/dL (ref 12.0–15.0)

## 2015-01-04 SURGERY — LAPAROSCOPIC CHOLECYSTECTOMY WITH INTRAOPERATIVE CHOLANGIOGRAM
Anesthesia: General | Site: Abdomen

## 2015-01-04 MED ORDER — MEPERIDINE HCL 25 MG/ML IJ SOLN: 6.2500 mg | INTRAMUSCULAR | Status: DC | PRN

## 2015-01-04 MED ORDER — IBUPROFEN 100 MG/5ML PO SUSP: 200.0000 mg | Freq: Four times a day (QID) | ORAL | Status: DC | PRN

## 2015-01-04 MED ORDER — PROPOFOL 10 MG/ML IV BOLUS
INTRAVENOUS | Status: DC | PRN
  Administered 2015-01-04: 20 mg via INTRAVENOUS

## 2015-01-04 MED ORDER — SUCCINYLCHOLINE CHLORIDE 20 MG/ML IJ SOLN
INTRAMUSCULAR | Status: DC | PRN
  Administered 2015-01-04: 100 mg via INTRAVENOUS

## 2015-01-04 MED ORDER — LACTATED RINGERS IV SOLN
INTRAVENOUS | Status: DC
  Administered 2015-01-04 (×3): via INTRAVENOUS

## 2015-01-04 MED ORDER — SODIUM CHLORIDE 0.9 % IR SOLN
Status: DC | PRN
  Administered 2015-01-04: 1000 mL

## 2015-01-04 MED ORDER — FENTANYL CITRATE 0.05 MG/ML IJ SOLN
50.0000 ug | INTRAMUSCULAR | Status: DC | PRN
  Administered 2015-01-04: 50 ug via INTRAVENOUS

## 2015-01-04 MED ORDER — HYDROMORPHONE HCL 1 MG/ML IJ SOLN
INTRAMUSCULAR | Status: AC
  Filled 2015-01-04: qty 1

## 2015-01-04 MED ORDER — HYDROMORPHONE HCL 1 MG/ML IJ SOLN
0.2500 mg | INTRAMUSCULAR | Status: DC | PRN
  Administered 2015-01-04 (×2): 0.25 mg via INTRAVENOUS
  Administered 2015-01-04 (×2): 0.5 mg via INTRAVENOUS

## 2015-01-04 MED ORDER — LIDOCAINE HCL (CARDIAC) 20 MG/ML IV SOLN
INTRAVENOUS | Status: DC | PRN
  Administered 2015-01-04: 60 mg via INTRAVENOUS

## 2015-01-04 MED ORDER — DEXAMETHASONE SODIUM PHOSPHATE 4 MG/ML IJ SOLN
INTRAMUSCULAR | Status: DC | PRN
  Administered 2015-01-04: 10 mg via INTRAVENOUS

## 2015-01-04 MED ORDER — KETOROLAC TROMETHAMINE 30 MG/ML IJ SOLN
INTRAMUSCULAR | Status: AC
  Filled 2015-01-04: qty 1

## 2015-01-04 MED ORDER — ONDANSETRON HCL 4 MG/2ML IJ SOLN
INTRAMUSCULAR | Status: DC | PRN
  Administered 2015-01-04: 4 mg via INTRAVENOUS

## 2015-01-04 MED ORDER — BUPIVACAINE-EPINEPHRINE 0.25% -1:200000 IJ SOLN
INTRAMUSCULAR | Status: DC | PRN
  Administered 2015-01-04: 6 mL

## 2015-01-04 MED ORDER — MIDAZOLAM HCL 2 MG/2ML IJ SOLN
INTRAMUSCULAR | Status: AC
  Filled 2015-01-04: qty 2

## 2015-01-04 MED ORDER — KETOROLAC TROMETHAMINE 30 MG/ML IJ SOLN
30.0000 mg | Freq: Once | INTRAMUSCULAR | Status: AC | PRN
  Administered 2015-01-04: 30 mg via INTRAVENOUS

## 2015-01-04 MED ORDER — SCOPOLAMINE 1 MG/3DAYS TD PT72
MEDICATED_PATCH | TRANSDERMAL | Status: DC | PRN
  Administered 2015-01-04: 1 via TRANSDERMAL

## 2015-01-04 MED ORDER — SCOPOLAMINE 1 MG/3DAYS TD PT72
MEDICATED_PATCH | TRANSDERMAL | Status: AC
  Filled 2015-01-04: qty 1

## 2015-01-04 MED ORDER — ROCURONIUM BROMIDE 100 MG/10ML IV SOLN
INTRAVENOUS | Status: DC | PRN
  Administered 2015-01-04: 20 mg via INTRAVENOUS

## 2015-01-04 MED ORDER — GLYCOPYRROLATE 0.2 MG/ML IJ SOLN
INTRAMUSCULAR | Status: DC | PRN
  Administered 2015-01-04: .5 mg via INTRAVENOUS

## 2015-01-04 MED ORDER — PROMETHAZINE HCL 25 MG/ML IJ SOLN
INTRAMUSCULAR | Status: AC
  Filled 2015-01-04: qty 1

## 2015-01-04 MED ORDER — FENTANYL CITRATE (PF) 100 MCG/2ML IJ SOLN
INTRAMUSCULAR | Status: AC
  Filled 2015-01-04: qty 6

## 2015-01-04 MED ORDER — IBUPROFEN 200 MG PO TABS: 200.0000 mg | ORAL_TABLET | Freq: Four times a day (QID) | ORAL | Status: DC | PRN

## 2015-01-04 MED ORDER — KETOROLAC TROMETHAMINE 30 MG/ML IJ SOLN
INTRAMUSCULAR | Status: DC | PRN
  Administered 2015-01-04: 30 mg via INTRAVENOUS

## 2015-01-04 MED ORDER — OXYCODONE-ACETAMINOPHEN 5-325 MG PO TABS: 1.0000 | ORAL_TABLET | ORAL | Status: DC | PRN

## 2015-01-04 MED ORDER — CEFAZOLIN SODIUM-DEXTROSE 2-3 GM-% IV SOLR
2.0000 g | INTRAVENOUS | Status: AC
  Administered 2015-01-04: 2 g via INTRAVENOUS

## 2015-01-04 MED ORDER — BUPIVACAINE-EPINEPHRINE (PF) 0.25% -1:200000 IJ SOLN
INTRAMUSCULAR | Status: AC
  Filled 2015-01-04: qty 30

## 2015-01-04 MED ORDER — NEOSTIGMINE METHYLSULFATE 10 MG/10ML IV SOLN
INTRAVENOUS | Status: DC | PRN
  Administered 2015-01-04: 4 mg via INTRAVENOUS

## 2015-01-04 MED ORDER — PROMETHAZINE HCL 25 MG/ML IJ SOLN
6.2500 mg | Freq: Once | INTRAMUSCULAR | Status: AC
  Administered 2015-01-04: 6.25 mg via INTRAVENOUS

## 2015-01-04 MED ORDER — MIDAZOLAM HCL 2 MG/2ML IJ SOLN
1.0000 mg | INTRAMUSCULAR | Status: DC | PRN
Start: 2015-01-04 — End: 2015-01-04
  Administered 2015-01-04: 2 mg via INTRAVENOUS

## 2015-01-04 MED ORDER — SODIUM CHLORIDE 0.9 % IV SOLN
INTRAVENOUS | Status: DC | PRN
  Administered 2015-01-04: 15 mL

## 2015-01-04 MED ORDER — PROMETHAZINE HCL 12.5 MG PO TABS: 12.5000 mg | ORAL_TABLET | Freq: Four times a day (QID) | ORAL | Status: DC | PRN

## 2015-01-04 SURGICAL SUPPLY — 43 items
APPLIER CLIP ROT 10 11.4 M/L (STAPLE) ×2
BLADE CLIPPER SURG (BLADE) IMPLANT
CANISTER SUCT 1200ML W/VALVE (MISCELLANEOUS) ×2 IMPLANT
CHLORAPREP W/TINT 26ML (MISCELLANEOUS) ×2 IMPLANT
CLIP APPLIE ROT 10 11.4 M/L (STAPLE) ×1 IMPLANT
COVER MAYO STAND STRL (DRAPES) ×4 IMPLANT
DECANTER SPIKE VIAL GLASS SM (MISCELLANEOUS) IMPLANT
DRAPE C-ARM 42X72 X-RAY (DRAPES) ×2 IMPLANT
DRAPE LAPAROSCOPIC ABDOMINAL (DRAPES) ×2 IMPLANT
ELECT REM PT RETURN 9FT ADLT (ELECTROSURGICAL) ×2
ELECTRODE REM PT RTRN 9FT ADLT (ELECTROSURGICAL) ×1 IMPLANT
FILTER SMOKE EVAC LAPAROSHD (FILTER) IMPLANT
GLOVE BIO SURGEON STRL SZ8 (GLOVE) IMPLANT
GLOVE BIOGEL PI IND STRL 7.0 (GLOVE) ×3 IMPLANT
GLOVE BIOGEL PI IND STRL 8 (GLOVE) ×1 IMPLANT
GLOVE BIOGEL PI INDICATOR 7.0 (GLOVE) ×3
GLOVE BIOGEL PI INDICATOR 8 (GLOVE) ×1
GLOVE SURG SS PI 7.0 STRL IVOR (GLOVE) ×4 IMPLANT
GLOVE SURG SS PI 8.0 STRL IVOR (GLOVE) ×2 IMPLANT
GOWN STRL REUS W/ TWL LRG LVL3 (GOWN DISPOSABLE) ×3 IMPLANT
GOWN STRL REUS W/TWL LRG LVL3 (GOWN DISPOSABLE) ×3
HEMOSTAT SNOW SURGICEL 2X4 (HEMOSTASIS) ×2 IMPLANT
LINER CANISTER 1000CC FLEX (MISCELLANEOUS) IMPLANT
LIQUID BAND (GAUZE/BANDAGES/DRESSINGS) ×2 IMPLANT
NS IRRIG 1000ML POUR BTL (IV SOLUTION) IMPLANT
PACK BASIN DAY SURGERY FS (CUSTOM PROCEDURE TRAY) ×2 IMPLANT
POUCH SPECIMEN RETRIEVAL 10MM (ENDOMECHANICALS) ×2 IMPLANT
SCISSORS LAP 5X35 DISP (ENDOMECHANICALS) IMPLANT
SET CHOLANGIOGRAPH 5 50 .035 (SET/KITS/TRAYS/PACK) ×2 IMPLANT
SET IRRIG TUBING LAPAROSCOPIC (IRRIGATION / IRRIGATOR) ×2 IMPLANT
SLEEVE ENDOPATH XCEL 5M (ENDOMECHANICALS) ×2 IMPLANT
SLEEVE SCD COMPRESS KNEE MED (MISCELLANEOUS) ×2 IMPLANT
STRIP CLOSURE SKIN 1/2X4 (GAUZE/BANDAGES/DRESSINGS) IMPLANT
SUT MNCRL AB 4-0 PS2 18 (SUTURE) ×2 IMPLANT
SUT VICRYL 0 UR6 27IN ABS (SUTURE) IMPLANT
SYR 20CC LL (SYRINGE) ×2 IMPLANT
TOWEL OR 17X24 6PK STRL BLUE (TOWEL DISPOSABLE) ×2 IMPLANT
TRAY LAPAROSCOPIC (CUSTOM PROCEDURE TRAY) ×2 IMPLANT
TROCAR XCEL BLUNT TIP 100MML (ENDOMECHANICALS) ×2 IMPLANT
TROCAR XCEL NON-BLD 11X100MML (ENDOMECHANICALS) ×2 IMPLANT
TROCAR XCEL NON-BLD 5MMX100MML (ENDOMECHANICALS) ×2 IMPLANT
TUBE CONNECTING 20X1/4 (TUBING) ×2 IMPLANT
TUBING INSUFFLATION (TUBING) ×2 IMPLANT

## 2015-01-04 NOTE — Op Note (Signed)
Laparoscopic Cholecystectomy with IOC Procedure Note  Indications: This patient presents with symptomatic gallbladder disease and will undergo laparoscopic cholecystectomy.The procedure has been discussed with the patient. Operative and non operative treatments have been discussed. Risks of surgery include bleeding, infection,  Common bile duct injury,  Injury to the stomach,liver, colon,small intestine, abdominal wall,  Diaphragm,  Major blood vessels,  And the need for an open procedure.  Other risks include worsening of medical problems, death,  DVT and pulmonary embolism, and cardiovascular events.   Medical options have also been discussed. The patient has been informed of long term expectations of surgery and non surgical options,  The patient agrees to proceed.     Pre-operative Diagnosis: biliary colic  Post-operative Diagnosis: Same  Surgeon: Sonal Dorwart A.   Assistants: OR staff  Anesthesia: General endotracheal anesthesia and Local anesthesia 0.25.% bupivacaine, with epinephrine  ASA Class: 2  Procedure Details  The patient was seen again in the Holding Room. The risks, benefits, complications, treatment options, and expected outcomes were discussed with the patient. The possibilities of reaction to medication, pulmonary aspiration, perforation of viscus, bleeding, recurrent infection, finding a normal gallbladder, the need for additional procedures, failure to diagnose a condition, the possible need to convert to an open procedure, and creating a complication requiring transfusion or operation were discussed with the patient. The patient and/or family concurred with the proposed plan, giving informed consent. The site of surgery properly noted/marked. The patient was taken to Operating Room, identified as [facility] and the procedure verified as Laparoscopic Cholecystectomy with Intraoperative Cholangiograms. A Time Out was held and the above information confirmed.  Prior  to the induction of general anesthesia, antibiotic prophylaxis was administered. General endotracheal anesthesia was then administered and tolerated well. After the induction, the abdomen was prepped in the usual sterile fashion. The patient was positioned in the supine position with the left arm comfortably tucked, along with some reverse Trendelenburg.  Local anesthetic agent was injected into the skin near the umbilicus and an incision made. The midline fascia was incised and the Hasson technique was used to introduce a 12 mm port under direct vision. It was secured with a figure of eight Vicryl suture placed in the usual fashion. Pneumoperitoneum was then created with CO2 and tolerated well without any adverse changes in the patient's vital signs. Additional trocars were introduced under direct vision with an 11 mm trocar in the epigastrium and two  5 mm trocars in the right upper quadrant. All skin incisions were infiltrated with a local anesthetic agent before making the incision and placing the trocars.  Laparoscopy showed no other significant pathology.   The gallbladder was identified, the fundus grasped and retracted cephalad. Adhesions were lysed bluntly and with the electrocautery where indicated, taking care not to injure any adjacent organs or viscus. The infundibulum was grasped and retracted laterally, exposing the peritoneum overlying the triangle of Calot. This was then divided and exposed in a blunt fashion. The cystic duct was clearly identified and bluntly dissected circumferentially. The junctions of the gallbladder, cystic duct and common bile duct were clearly identified prior to the division of any linear structure.   An incision was made in the cystic duct and the cholangiogram catheter introduced. The catheter was secured using an endoclip. The study showed no stones and good visualization of the distal and proximal biliary tree. The catheter was then removed.   The cystic duct was  then  ligated with surgical clips  on the patient  side and  clipped on the gallbladder side and divided. The cystic artery was identified, dissected free, ligated with clips and divided as well. Posterior cystic artery clipped and divided.  The gallbladder was dissected from the liver bed in retrograde fashion with the electrocautery. The gallbladder was removed with bag. The liver bed was irrigated and inspected. Hemostasis was achieved with the electrocautery. Copious irrigation was utilized and was repeatedly aspirated until clear all particulate matter. Hemostasis was achieved with no signs  Of bleeding or bile leakage.  Pneumoperitoneum was completely reduced after viewing removal of the trocars under direct vision. The wound was thoroughly irrigated and the fascia was then closed with a figure of eight suture; the skin was then closed with 4 0 and a sterile dressing  of adhesive was applied.  Instrument, sponge, and needle counts were correct at closure and at the conclusion of the case.   Findings:  Cholelithiasis mild chronic cholecystitis  Estimated Blood Loss: Minimal         Drains: none         Total IV Fluids: 700 mL         Specimens: Gallbladder           Complications: None; patient tolerated the procedure well.         Disposition: PACU - hemodynamically stable.         Condition: stable

## 2015-01-04 NOTE — H&P (Signed)
Anne Horton 10/17/2014 11:31 AM Location: [facility] Patient #: [id number] DOB: Aug 29, 1976 Married / Language: English / Race: White Female  History of Present Illness [name] A. Sten Dematteo MD; 10/17/2014 12:23 PM) Patient words: gallbladder  PT SEMT AT THE REQUEST OF DR Gerilyn Nestle FOR EPIGASTRIC ABDOMINAL PAIN FOR 1 MONTH. CONSTANT AND [place]. NOT SURE IF FOOD HELPS OR MAKES IT WORSE. CAN EAT STARCHES WITHOUT DIFICULTY. U/S SHOWS GB SLUDGE WITHOUT CHOLECYSTITIS.  The patient is a 39 year old female who presents with non-malignant abdominal pain. The patient has been previously evaluated by a primary care provider. Past evaluation has included abdominal ultrasound. This problem has not been previously treated. Symptoms include abdominal pain, constipation and nausea. Pain scores include an average pain level of 5 /10. The pain is located in the upper abdominal area. There is no radiation. The patient describes the pain as deep and burning. Onset was 4 week(s) ago. The pain occurs constantly. The patient describes symptoms as worsening. Associated symptoms include depressed mood. The patient is not currently being treated for this problem.   Other Problems [name], CMA; 10/17/2014 11:31 AM) Anxiety Disorder Back Pain Depression Migraine Headache  Past Surgical History [name], CMA; 10/17/2014 11:31 AM) Cesarean Section - 1 Oral Surgery  Diagnostic Studies History [name], CMA; 10/17/2014 11:31 AM) Colonoscopy 5-10 years ago Mammogram 1-3 years ago Pap Smear 1-5 years ago  Allergies [name], CMA; 10/17/2014 11:32 AM) Latex Exam Gloves *MEDICAL DEVICES AND SUPPLIES*  Medication History [name], CMA; 10/17/2014 11:34 AM) Xanax (0.5MG  Tablet, Oral as needed) Active. ZyrTEC Allergy (10MG  Tablet, Oral) Active. Vitamin B 12 (100MCG Lozenge, Oral) Active. Deplin 15 (15-90.314MG  Capsule, Oral) Active. Jolessa (0.15-0.03MG   Tablet, Oral) Active. Metoprolol Succinate ER (50MG  Tablet ER 24HR, Oral) Active. Propranolol HCl (10MG  Tablet, Oral) Active. Omeprazole (40MG  Capsule DR, Oral two times daily) Active.  Social History ([place]; 10/17/2014 11:31 AM) Alcohol use Occasional alcohol use. Caffeine use Carbonated beverages, Tea. No drug use Tobacco use Never smoker.  Family History [name], [place]; 10/17/2014 11:31 AM) Bleeding disorder Mother. Breast Cancer Mother. Cervical Cancer Mother. Diabetes Mellitus Mother. Migraine Headache Mother.  Pregnancy / Birth History [name], [place]; 10/17/2014 11:31 AM) Age at menarche 77 years. Contraceptive History Oral contraceptives. Gravida 2 Irregular periods Maternal age 68-30 Para 1  Review of Systems ([place]; 10/17/2014 11:31 AM) General Present- Appetite Loss and Fatigue. Not Present- Chills, Fever, Night Sweats, Weight Gain and Weight Loss. Skin Not Present- Change in Wart/Mole, Dryness, Hives, Jaundice, New Lesions, Non-Healing Wounds, Rash and Ulcer. HEENT Present- Seasonal Allergies and Wears glasses/contact lenses. Not Present- Earache, Hearing Loss, Hoarseness, Nose Bleed, Oral Ulcers, Ringing in the Ears, Sinus Pain, Sore Throat, Visual Disturbances and Yellow Eyes. Respiratory Not Present- Bloody sputum, Chronic Cough, Difficulty Breathing, Snoring and Wheezing. Breast Not Present- Breast Mass, Breast Pain, Nipple Discharge and Skin Changes. Cardiovascular Present- Leg Cramps, Rapid Heart Rate and Swelling of Extremities. Not Present- Chest Pain, Difficulty Breathing Lying Down, Palpitations and Shortness of Breath. Gastrointestinal Present- Abdominal Pain, Bloating, Bloody Stool and Nausea. Not Present- Change in Bowel Habits, Chronic diarrhea, Constipation, Difficulty Swallowing, Excessive gas, Gets full quickly at meals, Hemorrhoids, Indigestion, Rectal Pain and Vomiting. Female Genitourinary Not Present- Frequency,  Nocturia, Painful Urination, Pelvic Pain and Urgency. Musculoskeletal Present- Muscle Pain. Not Present- Back Pain, Joint Pain, Joint Stiffness, Muscle Weakness and Swelling of Extremities. Neurological Not Present- Decreased Memory, Fainting, Headaches, Numbness, Seizures, Tingling, Tremor,  Trouble walking and Weakness. Psychiatric Present- Depression. Not Present- Anxiety, Bipolar, Change in Sleep Pattern, Fearful and Frequent crying. Endocrine Not Present- Cold Intolerance, Excessive Hunger, Hair Changes, Heat Intolerance, Hot flashes and New Diabetes. Hematology Present- Easy Bruising. Not Present- Excessive bleeding, Gland problems, HIV and Persistent Infections.   Vitals ([name] CMA; 10/17/2014 11:31 AM) 10/17/2014 11:31 AM Weight: 135 lb Height: 62in Body Surface Area: 1.64 m Body Mass Index: 24.69 kg/m Temp.: 97.3F(Temporal)  Pulse: 88 (Regular)  BP: 124/74 (Sitting, Left Arm, Standard)    Physical Exam (Germaine Ripp A. Sigmond Patalano MD; 10/17/2014 11:55 AM) General Mental Status-Alert. General Appearance-Consistent with stated age. Hydration-Well hydrated. Voice-Normal.  Head and Neck Head-normocephalic, atraumatic with no lesions or palpable masses.  Eye Eyeball - Bilateral-Extraocular movements intact. Sclera/Conjunctiva - Bilateral-No scleral icterus.  Chest and Lung Exam Chest and lung exam reveals -quiet, even and easy respiratory effort with no use of accessory muscles and on auscultation, normal breath sounds, no adventitious sounds and normal vocal resonance. Inspection Chest Wall - Normal. Back - normal.  Cardiovascular Cardiovascular examination reveals -on palpation PMI is normal in location and amplitude, no palpable S3 or S4. Normal cardiac borders., normal heart sounds, regular rate and rhythm with no murmurs, carotid auscultation reveals no bruits and normal pedal pulses bilaterally.  Abdomen Inspection Inspection of the abdomen  reveals - No Hernias. Skin - Scar - no surgical scars. Palpation/Percussion Palpation and Percussion of the abdomen reveal - Soft, Non Tender, No Rebound tenderness, No Rigidity (guarding) and No hepatosplenomegaly. Auscultation Auscultation of the abdomen reveals - Bowel sounds normal.  Neurologic Neurologic evaluation reveals -alert and oriented x 3 with no impairment of recent or remote memory. Mental Status-Normal.  Musculoskeletal Normal Exam - Left-Upper Extremity Strength Normal and Lower Extremity Strength Normal. Normal Exam - Right-Upper Extremity Strength Normal, Lower Extremity Weakness.    Assessment & Plan (Najmo Pardue A. Edan Serratore MD; 10/17/2014 11:55 AM) ABDOMINAL PAIN, CHRONIC, EPIGASTRIC (789.06  R10.13) Impression: HAS SLUDGE IN GB BUT NOT TYPICAL SYMPTOMS SINCE PAIN CONSTANT AND KNAWING IN NATURE. SEEING GI AND MAY NEED ENDOSCOPY PRIOR TO SURGICAL REMOVAL OF GALLBLADDER. WILL AWAIT THEIR INPUT. The procedure has been discussed with the patient. Risks of laparoscopic cholecystectomy include bleeding, infection, bile duct injury, leak, death, open surgery, diarrhea, other surgery, organ injury, blood vessel injury, DVT, and additional care. Current Plans  Pt Education - INSTRUCTIONS: discussed with patient and provided information. Pt Education - Laparoscopic Cholecystectomy: gallbladder   Signed by Turner Daniels, MD (10/17/2014 12:24 PM)

## 2015-01-04 NOTE — Transfer of Care (Signed)
Immediate Anesthesia Transfer of Care Note  Patient: Anne Horton  Procedure(s) Performed: Procedure(s): LAPAROSCOPIC CHOLECYSTECTOMY WITH INTRAOPERATIVE CHOLANGIOGRAM (N/A)  Patient Location: PACU  Anesthesia Type:General  Level of Consciousness: awake, alert , oriented and patient cooperative  Airway & Oxygen Therapy: Patient Spontanous Breathing and Patient connected to face mask oxygen  Post-op Assessment: Report given to RN and Post -op Vital signs reviewed and stable  Post vital signs: Reviewed and stable  Last Vitals:  Filed Vitals:   01/04/15 0950  BP: 122/61  Pulse: 81  Temp: 37.3 C  Resp: 20    Complications: No apparent anesthesia complications

## 2015-01-04 NOTE — Anesthesia Postprocedure Evaluation (Signed)
  Anesthesia Post-op Note  Patient: Anne Horton  Procedure(s) Performed: Procedure(s): LAPAROSCOPIC CHOLECYSTECTOMY WITH INTRAOPERATIVE CHOLANGIOGRAM (N/A)  Patient Location: PACU  Anesthesia Type: General   Level of Consciousness: awake, alert  and oriented  Airway and Oxygen Therapy: Patient Spontanous Breathing  Post-op Pain: mild  Post-op Assessment: Post-op Vital signs reviewed  Post-op Vital Signs: Reviewed  Last Vitals:  Filed Vitals:   01/04/15 1403  BP:   Pulse:   Temp: 36.8 C  Resp: 18    Complications: No apparent anesthesia complications

## 2015-01-04 NOTE — Interval H&P Note (Signed)
History and Physical Interval Note:  01/04/2015 10:23 AM  Anne Horton  has presented today for surgery, with the diagnosis of Biliary Colic  The various methods of treatment have been discussed with the patient and family. After consideration of risks, benefits and other options for treatment, the patient has consented to  Procedure(s): LAPAROSCOPIC CHOLECYSTECTOMY WITH INTRAOPERATIVE CHOLANGIOGRAM (N/A) as a surgical intervention .  The patient's history has been reviewed, patient examined, no change in status, stable for surgery.  I have reviewed the patient's chart and labs.  Questions were answered to the patient's satisfaction.   Endoscopy  Normal.. Benefit of surgery at relieving symptoms is about 50 %.  The procedure has been discussed with the patient. Operative and non operative treatments have been discussed. Risks of surgery include bleeding, infection,  Common bile duct injury,  Injury to the stomach,liver, colon,small intestine, abdominal wall,  Diaphragm,  Major blood vessels,  And the need for an open procedure.  Other risks include worsening of medical problems, death,  DVT and pulmonary embolism, and cardiovascular events.   Medical options have also been discussed. The patient has been informed of long term expectations of surgery and non surgical options,  The patient agrees to proceed.    Jensyn Shave A.

## 2015-01-04 NOTE — Anesthesia Procedure Notes (Signed)
Procedure Name: Intubation Date/Time: 01/04/2015 10:48 AM Performed by: Deleon Passe D Pre-anesthesia Checklist: Patient identified, Emergency Drugs available, Suction available and Patient being monitored Patient Re-evaluated:Patient Re-evaluated prior to inductionOxygen Delivery Method: Circle System Utilized Preoxygenation: Pre-oxygenation with 100% oxygen Intubation Type: IV induction Ventilation: Mask ventilation without difficulty Laryngoscope Size: Mac and 3 Grade View: Grade I Tube type: Oral Tube size: 7.0 mm Number of attempts: 1 Airway Equipment and Method: Stylet and Oral airway Placement Confirmation: ETT inserted through vocal cords under direct vision,  positive ETCO2 and breath sounds checked- equal and bilateral Secured at: 21 cm Tube secured with: Tape Dental Injury: Teeth and Oropharynx as per pre-operative assessment

## 2015-01-04 NOTE — Discharge Instructions (Signed)
[facility] ______[facility] °LAPAROSCOPIC SURGERY: POST OP INSTRUCTIONS °Always review your discharge instruction sheet given to you by the facility where your surgery was performed. °IF YOU HAVE DISABILITY OR FAMILY LEAVE FORMS, YOU MUST BRING THEM TO THE OFFICE FOR PROCESSING.   °DO NOT GIVE THEM TO YOUR DOCTOR. ° °1. A prescription for pain medication may be given to you upon discharge.  Take your pain medication as prescribed, if needed.  If narcotic pain medicine is not needed, then you may take acetaminophen (Tylenol) or ibuprofen (Advil) as needed. °2. Take your usually prescribed medications unless otherwise directed. °3. If you need a refill on your pain medication, please contact your pharmacy.  They will contact our office to request authorization. Prescriptions will not be filled after 5pm or on week-ends. °4. You should follow a light diet the first few days after arrival home, such as soup and crackers, etc.  Be sure to include lots of fluids daily. °5. Most patients will experience some swelling and bruising in the area of the incisions.  Ice packs will help.  Swelling and bruising can take several days to resolve.  °6. It is common to experience some constipation if taking pain medication after surgery.  Increasing fluid intake and taking a stool softener (such as Colace) will usually help or prevent this problem from occurring.  A mild laxative (Milk of Magnesia or Miralax) should be taken according to package instructions if there are no bowel movements after 48 hours. °7. Unless discharge instructions indicate otherwise, you may remove your bandages 24-48 hours after surgery, and you may shower at that time.  You may have steri-strips (small skin tapes) in place directly over the incision.  These strips should be left on the skin for 7-10 days.  If your surgeon used skin glue on the incision, you may shower in 24 hours.  The glue will flake off over the next 2-3 weeks.  Any sutures or  staples will be removed at the office during your follow-up visit. °8. ACTIVITIES:  You may resume regular (light) daily activities beginning the next day--such as daily self-care, walking, climbing stairs--gradually increasing activities as tolerated.  You may have sexual intercourse when it is comfortable.  Refrain from any heavy lifting or straining until approved by your doctor. °a. You may drive when you are no longer taking prescription pain medication, you can comfortably wear a seatbelt, and you can safely maneuver your car and apply brakes. °b. RETURN TO WORK:  __________________________________________________________ °9. You should see your doctor in the office for a follow-up appointment approximately 2-3 weeks after your surgery.  Make sure that you call for this appointment within a day or two after you arrive home to insure a convenient appointment time. °10. OTHER INSTRUCTIONS: __________________________________________________________________________________________________________________________ __________________________________________________________________________________________________________________________ °WHEN TO CALL YOUR DOCTOR: °1. Fever over 101.0 °2. Inability to urinate °3. Continued bleeding from incision. °4. Increased pain, redness, or drainage from the incision. °5. Increasing abdominal pain ° °The clinic staff is available to answer your questions during regular business hours.  Please don’t hesitate to call and ask to speak to one of the nurses for clinical concerns.  If you have a medical emergency, go to the nearest emergency room or call 911.  A surgeon from [facility] is always on call at the hospital. °[address], [address], [place], [place]  [address] ? [address] [address], [place], [place]   [address] °[phone] ? [phone] ? FAX [phone] °Web site:   [email or web address] ° °Post Anesthesia Home Care Instructions ° °Activity: °Get  plenty of rest for the remainder of the day. A responsible adult should stay with you for 24 hours following the procedure.  °For the next 24 hours, DO NOT: °-Drive a car °-Operate machinery °-Drink alcoholic beverages °-Take any medication unless instructed by your physician °-Make any legal decisions or sign important papers. ° °Meals: °Start with liquid foods such as gelatin or soup. Progress to regular foods as tolerated. Avoid greasy, spicy, heavy foods. If nausea and/or vomiting occur, drink only clear liquids until the nausea and/or vomiting subsides. Call your physician if vomiting continues. ° °Special Instructions/Symptoms: °Your throat may feel dry or sore from the anesthesia or the breathing tube placed in your throat during surgery. If this causes discomfort, gargle with warm salt water. The discomfort should disappear within 24 hours. ° °If you had a scopolamine patch placed behind your ear for the management of post- operative nausea and/or vomiting: ° °1. The medication in the patch is effective for 72 hours, after which it should be removed.  Wrap patch in a tissue and discard in the trash. Wash hands thoroughly with soap and water. °2. You may remove the patch earlier than 72 hours if you experience unpleasant side effects which may include dry mouth, dizziness or visual disturbances. °3. Avoid touching the patch. Wash your hands with soap and water after contact with the patch. °  ° °

## 2015-01-04 NOTE — Anesthesia Preprocedure Evaluation (Signed)
Anesthesia Evaluation  Patient identified by MRN, date of birth, ID band Patient awake    Reviewed: Allergy & Precautions, NPO status , Patient's Chart, lab work & pertinent test results  Airway Mallampati: I  TM Distance: >3 FB Neck ROM: Full    Dental  (+) Teeth Intact   Pulmonary  breath sounds clear to auscultation        Cardiovascular Rhythm:Regular Rate:Normal     Neuro/Psych    GI/Hepatic   Endo/Other    Renal/GU      Musculoskeletal   Abdominal   Peds  Hematology   Anesthesia Other Findings   Reproductive/Obstetrics                             Anesthesia Physical Anesthesia Plan  ASA: II  Anesthesia Plan: General   Post-op Pain Management:    Induction: Intravenous  Airway Management Planned: Oral ETT  Additional Equipment:   Intra-op Plan:   Post-operative Plan: Extubation in OR  Informed Consent: I have reviewed the patients History and Physical, chart, labs and discussed the procedure including the risks, benefits and alternatives for the proposed anesthesia with the patient or authorized representative who has indicated his/her understanding and acceptance.   Dental advisory given  Plan Discussed with: CRNA, Anesthesiologist and Surgeon  Anesthesia Plan Comments:         Anesthesia Quick Evaluation

## 2015-01-06 ENCOUNTER — Encounter (HOSPITAL_BASED_OUTPATIENT_CLINIC_OR_DEPARTMENT_OTHER): Payer: Self-pay | Admitting: Surgery

## 2015-04-19 ENCOUNTER — Other Ambulatory Visit: Payer: Self-pay | Admitting: Cardiovascular Disease

## 2015-04-19 ENCOUNTER — Other Ambulatory Visit: Payer: Self-pay | Admitting: Physician Assistant

## 2015-05-30 ENCOUNTER — Encounter: Payer: Self-pay | Admitting: Cardiovascular Disease

## 2015-05-30 ENCOUNTER — Ambulatory Visit (INDEPENDENT_AMBULATORY_CARE_PROVIDER_SITE_OTHER): Payer: 59 | Admitting: Cardiovascular Disease

## 2015-05-30 VITALS — BP 120/80 | HR 87 | Ht 62.0 in | Wt 137.0 lb

## 2015-05-30 DIAGNOSIS — R002 Palpitations: Secondary | ICD-10-CM | POA: Diagnosis not present

## 2015-05-30 DIAGNOSIS — R079 Chest pain, unspecified: Secondary | ICD-10-CM

## 2015-05-30 NOTE — Patient Instructions (Signed)
Medication Instructions:  Your physician recommends that you continue on your current medications as directed. Please refer to the Current Medication list given to you today.   Labwork: None Ordered   Testing/Procedures: None Ordered   Follow-Up: Your physician wants you to follow-up in: 1 year with Dr. [name].  You will receive a reminder letter in the mail two months in advance. If you don't receive a letter, please call our office to schedule the follow-up appointment.      
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
## 2015-08-16 ENCOUNTER — Other Ambulatory Visit: Payer: Self-pay | Admitting: Cardiovascular Disease

## 2015-10-02 DIAGNOSIS — R05 Cough: Secondary | ICD-10-CM | POA: Diagnosis not present

## 2015-10-02 DIAGNOSIS — J0100 Acute maxillary sinusitis, unspecified: Secondary | ICD-10-CM | POA: Diagnosis not present

## 2015-10-02 DIAGNOSIS — J0110 Acute frontal sinusitis, unspecified: Secondary | ICD-10-CM | POA: Diagnosis not present

## 2015-10-02 DIAGNOSIS — R6883 Chills (without fever): Secondary | ICD-10-CM | POA: Diagnosis not present

## 2015-10-02 MED FILL — NOREL AD TABLET: 4-10-325 | 7 days supply | Qty: 30 | Fill #0

## 2015-10-02 MED FILL — AMOX-CLAV 875-125 MG TABLET: 875-125 | 10 days supply | Qty: 20 | Fill #0

## 2015-10-03 MED FILL — HYDROCODONE-HOMATROPINE SYR: 5-1.5 | 6 days supply | Qty: 120 | Fill #0

## 2015-10-30 MED FILL — ALPRAZolam 0.5 MG TABS: 0.5 | 15 days supply | Qty: 30 | Fill #2

## 2015-11-15 MED FILL — SETLAKIN 0.15 MG-0.03 MG TA: 0.15-0.03 | 90 days supply | Qty: 91 | Fill #1

## 2015-11-15 MED FILL — METOPROLOL SUCC ER 50 MG TA: 50 | 90 days supply | Qty: 90 | Fill #1

## 2015-11-15 MED FILL — DULoxetine HCL 60 MG CPEP: 60 | 90 days supply | Qty: 180 | Fill #1

## 2015-12-18 DIAGNOSIS — F321 Major depressive disorder, single episode, moderate: Secondary | ICD-10-CM | POA: Diagnosis not present

## 2016-01-16 MED FILL — ALPRAZolam 0.5 MG TABS: 0.5 | 15 days supply | Qty: 30 | Fill #0

## 2016-01-22 ENCOUNTER — Other Ambulatory Visit: Payer: Self-pay | Admitting: Cardiovascular Disease

## 2016-02-08 MED FILL — DULoxetine HCL 60 MG CPEP: 60 | 90 days supply | Qty: 180 | Fill #0

## 2016-02-08 MED FILL — PROPRANOLOL 10 MG TABLET: 10 | 7 days supply | Qty: 30 | Fill #0

## 2016-02-13 MED FILL — PROPRANOLOL 10 MG TABLET: 10 | 7 days supply | Qty: 30 | Fill #1

## 2016-02-13 MED FILL — SETLAKIN 0.15 MG-0.03 MG TA: 0.15-0.03 | 90 days supply | Qty: 91 | Fill #2

## 2016-02-13 MED FILL — ALPRAZolam 0.5 MG TABS: 0.5 | 15 days supply | Qty: 30 | Fill #1

## 2016-03-11 MED FILL — METOPROLOL SUCC ER 50 MG TA: 50 | 90 days supply | Qty: 90 | Fill #2

## 2016-04-25 DIAGNOSIS — H5213 Myopia, bilateral: Secondary | ICD-10-CM | POA: Diagnosis not present

## 2016-05-01 MED FILL — ALPRAZolam 0.5 MG TABS: 0.5 | 15 days supply | Qty: 30 | Fill #2

## 2016-05-01 MED FILL — PROPRANOLOL 10 MG TABLET: 10 | 7 days supply | Qty: 30 | Fill #2

## 2016-05-21 MED FILL — DULoxetine HCL 60 MG CPEP: 60 | 90 days supply | Qty: 180 | Fill #1

## 2016-05-21 MED FILL — ALPRAZolam 0.5 MG TABS: 0.5 | 15 days supply | Qty: 30 | Fill #3

## 2016-05-21 MED FILL — INTROVALE 0.15-0.03 MG TAB: 0.15-0.03 | 90 days supply | Qty: 91 | Fill #3

## 2016-06-23 MED FILL — METOPROLOL SUCC ER 50 MG TA: 50 | 90 days supply | Qty: 90 | Fill #3

## 2016-07-16 DIAGNOSIS — F321 Major depressive disorder, single episode, moderate: Secondary | ICD-10-CM | POA: Diagnosis not present

## 2016-07-16 MED FILL — ARIPiprazole 2 MG TABS: 2 | 30 days supply | Qty: 30 | Fill #0

## 2016-08-21 MED FILL — ARIPiprazole 2 MG TABS: 2 | 30 days supply | Qty: 30 | Fill #1

## 2016-08-29 MED FILL — ALPRAZolam 0.5 MG TABS: 0.5 | 15 days supply | Qty: 30 | Fill #0

## 2016-08-29 MED FILL — DULoxetine HCL 60 MG CPEP: 60 | 90 days supply | Qty: 180 | Fill #0

## 2016-09-23 MED FILL — ARIPiprazole 2 MG TABS: 2 | 30 days supply | Qty: 30 | Fill #2

## 2016-10-01 ENCOUNTER — Other Ambulatory Visit: Payer: Self-pay | Admitting: Cardiovascular Disease

## 2016-10-04 MED FILL — METOPROLOL SUCC ER 50 MG TA: 50 | 15 days supply | Qty: 15 | Fill #0

## 2016-10-24 ENCOUNTER — Encounter: Payer: Self-pay | Admitting: Cardiovascular Disease

## 2016-10-28 ENCOUNTER — Ambulatory Visit (INDEPENDENT_AMBULATORY_CARE_PROVIDER_SITE_OTHER): Payer: 59 | Admitting: Cardiovascular Disease

## 2016-10-28 ENCOUNTER — Encounter: Payer: Self-pay | Admitting: Cardiovascular Disease

## 2016-10-28 VITALS — BP 104/80 | HR 112 | Ht 62.0 in | Wt 147.8 lb

## 2016-10-28 DIAGNOSIS — R000 Tachycardia, unspecified: Secondary | ICD-10-CM | POA: Diagnosis not present

## 2016-10-28 MED ORDER — METOPROLOL SUCCINATE ER 50 MG PO TB24: 50.0000 mg | ORAL_TABLET | Freq: Every day | ORAL | 3 refills | Status: DC

## 2016-10-28 MED FILL — METOPROLOL SUCC ER 50 MG TA: 50 | 90 days supply | Qty: 90 | Fill #0

## 2016-10-28 NOTE — Patient Instructions (Addendum)
Medication Instructions:  Your physician recommends that you continue on your current medications as directed. Please refer to the Current Medication list given to you today.   Labwork: None Ordered   Testing/Procedures: None Ordered   Follow-Up: Your physician wants you to follow-up in: 1 year with Dr. [name].  You will receive a reminder letter in the mail two months in advance. If you don't receive a letter, please call our office to schedule the follow-up appointment.   If you need a refill on your cardiac medications before your next appointment, please call your pharmacy.   Thank you for choosing [facility]! Carreen Milius, RN [phone]    
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
## 2016-11-04 MED FILL — ARIPiprazole 2 MG TABS: 2 | 30 days supply | Qty: 30 | Fill #3

## 2016-12-02 DIAGNOSIS — J329 Chronic sinusitis, unspecified: Secondary | ICD-10-CM | POA: Diagnosis not present

## 2016-12-12 MED FILL — DULoxetine HCL 60 MG CPEP: 60 | 90 days supply | Qty: 180 | Fill #1

## 2017-01-22 MED FILL — ARIPiprazole 2 MG TABS: 2 | 30 days supply | Qty: 30 | Fill #4

## 2017-01-22 MED FILL — METOPROLOL SUCC ER 50 MG TA: 50 | 90 days supply | Qty: 90 | Fill #1

## 2017-03-26 MED FILL — ARIPiprazole 2 MG TABS: 2 | 30 days supply | Qty: 30 | Fill #5

## 2017-03-28 MED FILL — DULoxetine HCL 60 MG CPEP: 60 | 30 days supply | Qty: 60 | Fill #0

## 2017-04-03 DIAGNOSIS — F321 Major depressive disorder, single episode, moderate: Secondary | ICD-10-CM | POA: Diagnosis not present

## 2017-04-28 MED FILL — DULoxetine HCL 60 MG CPEP: 60 | 30 days supply | Qty: 60 | Fill #0

## 2017-04-28 MED FILL — ARIPiprazole 2 MG TABS: 2 | 30 days supply | Qty: 30 | Fill #0

## 2017-04-28 MED FILL — METOPROLOL SUCC ER 50 MG TA: 50 | 90 days supply | Qty: 90 | Fill #2

## 2017-04-29 DIAGNOSIS — H5213 Myopia, bilateral: Secondary | ICD-10-CM | POA: Diagnosis not present

## 2017-06-02 MED FILL — DULoxetine HCL 60 MG CPEP: 60 | 30 days supply | Qty: 60 | Fill #1

## 2017-08-01 MED FILL — DULoxetine HCL 60 MG CPEP: 60 | 30 days supply | Qty: 60 | Fill #2

## 2017-08-08 ENCOUNTER — Ambulatory Visit (HOSPITAL_BASED_OUTPATIENT_CLINIC_OR_DEPARTMENT_OTHER): Payer: 59

## 2017-08-08 ENCOUNTER — Other Ambulatory Visit: Payer: Self-pay | Admitting: *Deleted

## 2017-08-08 DIAGNOSIS — D509 Iron deficiency anemia, unspecified: Secondary | ICD-10-CM | POA: Diagnosis not present

## 2017-08-08 LAB — CBC WITH DIFFERENTIAL/PLATELET
BASO%: 0.8 % (ref 0.0–2.0)
BASOS ABS: 0.1 10*3/uL (ref 0.0–0.1)
EOS%: 2.7 % (ref 0.0–7.0)
Eosinophils Absolute: 0.2 10*3/uL (ref 0.0–0.5)
HEMATOCRIT: 40.6 % (ref 34.8–46.6)
HEMOGLOBIN: 13.7 g/dL (ref 11.6–15.9)
LYMPH#: 2 10*3/uL (ref 0.9–3.3)
LYMPH%: 26.2 % (ref 14.0–49.7)
MCH: 28.7 pg (ref 25.1–34.0)
MCHC: 33.7 g/dL (ref 31.5–36.0)
MCV: 84.9 fL (ref 79.5–101.0)
MONO#: 1 10*3/uL — ABNORMAL HIGH (ref 0.1–0.9)
MONO%: 12.4 % (ref 0.0–14.0)
NEUT#: 4.5 10*3/uL (ref 1.5–6.5)
NEUT%: 57.9 % (ref 38.4–76.8)
Platelets: 443 10*3/uL — ABNORMAL HIGH (ref 145–400)
RBC: 4.78 10*6/uL (ref 3.70–5.45)
RDW: 13.3 % (ref 11.2–14.5)
WBC: 7.7 10*3/uL (ref 3.9–10.3)

## 2017-08-11 LAB — FERRITIN: Ferritin: 14 ng/ml (ref 9–269)

## 2017-08-14 ENCOUNTER — Telehealth: Payer: Self-pay | Admitting: Emergency Medicine

## 2017-08-14 NOTE — Telephone Encounter (Addendum)
Pt verbalized understanding of this note.   ----- Message from Ladell Pier, MD sent at 08/14/2017  7:27 AM EST ----- Please call patient, hb is normal, iron is borderline low, should f/u with primary MD if she has fatigue

## 2017-09-01 MED FILL — METOPROLOL SUCC ER 50 MG TA: 50 | 90 days supply | Qty: 90 | Fill #3

## 2017-09-01 MED FILL — DULoxetine HCL 60 MG CPEP: 60 | 30 days supply | Qty: 60 | Fill #3

## 2017-09-02 MED FILL — ALPRAZolam 0.5 MG TABS: 0.5 | 30 days supply | Qty: 30 | Fill #0

## 2017-10-02 DIAGNOSIS — F330 Major depressive disorder, recurrent, mild: Secondary | ICD-10-CM | POA: Diagnosis not present

## 2017-10-28 MED FILL — DULoxetine HCL 60 MG CPEP: 60 | 30 days supply | Qty: 60 | Fill #4

## 2017-12-11 MED FILL — ALPRAZolam 0.5 MG TABS: 0.5 | 30 days supply | Qty: 60 | Fill #0

## 2017-12-17 ENCOUNTER — Telehealth: Payer: Self-pay | Admitting: Cardiovascular Disease

## 2017-12-17 ENCOUNTER — Other Ambulatory Visit: Payer: Self-pay | Admitting: *Deleted

## 2017-12-17 MED ORDER — METOPROLOL SUCCINATE ER 50 MG PO TB24: 50.0000 mg | ORAL_TABLET | Freq: Every day | ORAL | 0 refills | Status: DC

## 2017-12-17 MED FILL — METOPROLOL SUCC ER 50 MG TA: 50 | 90 days supply | Qty: 90 | Fill #0

## 2017-12-17 NOTE — Telephone Encounter (Signed)
New message     *STAT* If patient is at the pharmacy, call can be transferred to refill team.   1. Which medications need to be refilled? (please list name of each medication and dose if known) metoprolol succinate (TOPROL-XL) 50 MG 24 hr tablet  2. Which pharmacy/location (including street and city if local pharmacy) is medication to be sent to?[place], [place]  3. Do they need a 30 day or 90 day supply? [place]

## 2018-02-03 MED FILL — DULoxetine HCL 60 MG CPEP: 60 | 30 days supply | Qty: 60 | Fill #0

## 2018-02-03 MED FILL — ALPRAZolam 0.5 MG TABS: 0.5 | 30 days supply | Qty: 60 | Fill #1

## 2018-02-27 ENCOUNTER — Ambulatory Visit: Payer: 59 | Admitting: Cardiovascular Disease

## 2018-02-27 ENCOUNTER — Encounter: Payer: Self-pay | Admitting: Cardiovascular Disease

## 2018-02-27 VITALS — BP 100/76 | HR 72 | Ht 62.0 in | Wt 144.4 lb

## 2018-02-27 DIAGNOSIS — R000 Tachycardia, unspecified: Secondary | ICD-10-CM | POA: Diagnosis not present
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
## 2018-02-27 NOTE — Patient Instructions (Signed)
Medication Instructions:  Your physician recommends that you continue on your current medications as directed. Please refer to the Current Medication list given to you today.   Labwork: None Ordered   Testing/Procedures: None Ordered   Follow-Up: Your physician wants you to follow-up in: 1 year with Dr. [name].  You will receive a reminder letter in the mail two months in advance. If you don't receive a letter, please call our office to schedule the follow-up appointment.   If you need a refill on your cardiac medications before your next appointment, please call your pharmacy.   Thank you for choosing [facility]! Tahjae Durr, RN [phone]    

## 2018-02-28 DIAGNOSIS — Z0000 Encounter for general adult medical examination without abnormal findings: Secondary | ICD-10-CM | POA: Diagnosis not present

## 2018-03-02 DIAGNOSIS — J309 Allergic rhinitis, unspecified: Secondary | ICD-10-CM | POA: Diagnosis not present

## 2018-03-02 DIAGNOSIS — R000 Tachycardia, unspecified: Secondary | ICD-10-CM | POA: Diagnosis not present

## 2018-03-02 DIAGNOSIS — D510 Vitamin B12 deficiency anemia due to intrinsic factor deficiency: Secondary | ICD-10-CM | POA: Diagnosis not present

## 2018-03-02 DIAGNOSIS — Q8500 Neurofibromatosis, unspecified: Secondary | ICD-10-CM | POA: Diagnosis not present

## 2018-03-02 DIAGNOSIS — D473 Essential (hemorrhagic) thrombocythemia: Secondary | ICD-10-CM | POA: Diagnosis not present

## 2018-03-02 DIAGNOSIS — F321 Major depressive disorder, single episode, moderate: Secondary | ICD-10-CM | POA: Diagnosis not present

## 2018-03-02 DIAGNOSIS — Z23 Encounter for immunization: Secondary | ICD-10-CM | POA: Diagnosis not present

## 2018-03-02 DIAGNOSIS — Z0000 Encounter for general adult medical examination without abnormal findings: Secondary | ICD-10-CM | POA: Diagnosis not present

## 2018-03-02 DIAGNOSIS — D509 Iron deficiency anemia, unspecified: Secondary | ICD-10-CM | POA: Diagnosis not present

## 2018-03-04 ENCOUNTER — Encounter: Payer: Self-pay | Admitting: Cardiovascular Disease

## 2018-03-10 DIAGNOSIS — Z1231 Encounter for screening mammogram for malignant neoplasm of breast: Secondary | ICD-10-CM | POA: Diagnosis not present

## 2018-03-11 DIAGNOSIS — R1032 Left lower quadrant pain: Secondary | ICD-10-CM | POA: Diagnosis not present

## 2018-03-11 DIAGNOSIS — Z01419 Encounter for gynecological examination (general) (routine) without abnormal findings: Secondary | ICD-10-CM | POA: Diagnosis not present

## 2018-03-11 DIAGNOSIS — Z124 Encounter for screening for malignant neoplasm of cervix: Secondary | ICD-10-CM | POA: Diagnosis not present

## 2018-03-13 ENCOUNTER — Other Ambulatory Visit: Payer: Self-pay | Admitting: Cardiovascular Disease

## 2018-03-13 ENCOUNTER — Other Ambulatory Visit: Payer: Self-pay

## 2018-03-13 ENCOUNTER — Emergency Department (HOSPITAL_COMMUNITY)
Admission: EM | Admit: 2018-03-13 | Discharge: 2018-03-13 | Disposition: A | Discharge status: Home / Self Care | Payer: PRIVATE HEALTH INSURANCE | Attending: Emergency Medicine | Admitting: Emergency Medicine

## 2018-03-13 ENCOUNTER — Encounter (HOSPITAL_COMMUNITY): Payer: Self-pay

## 2018-03-13 DIAGNOSIS — Y9389 Activity, other specified: Secondary | ICD-10-CM | POA: Insufficient documentation

## 2018-03-13 DIAGNOSIS — Y999 Unspecified external cause status: Secondary | ICD-10-CM | POA: Insufficient documentation

## 2018-03-13 DIAGNOSIS — S0101XA Laceration without foreign body of scalp, initial encounter: Secondary | ICD-10-CM | POA: Diagnosis not present

## 2018-03-13 DIAGNOSIS — Y939 Activity, unspecified: Secondary | ICD-10-CM | POA: Diagnosis not present

## 2018-03-13 DIAGNOSIS — Z79899 Other long term (current) drug therapy: Secondary | ICD-10-CM | POA: Insufficient documentation

## 2018-03-13 DIAGNOSIS — W2209XA Striking against other stationary object, initial encounter: Secondary | ICD-10-CM | POA: Insufficient documentation

## 2018-03-13 DIAGNOSIS — Y92531 Health care provider office as the place of occurrence of the external cause: Secondary | ICD-10-CM | POA: Diagnosis not present

## 2018-03-13 MED ORDER — LIDOCAINE-EPINEPHRINE (PF) 2 %-1:200000 IJ SOLN
10.0000 mL | Freq: Once | INTRAMUSCULAR | Status: AC
  Administered 2018-03-13: 10 mL via INTRADERMAL
  Filled 2018-03-13: qty 20

## 2018-03-13 MED ORDER — BACITRACIN ZINC 500 UNIT/GM EX OINT
TOPICAL_OINTMENT | Freq: Two times a day (BID) | CUTANEOUS | Status: DC
  Filled 2018-03-13: qty 1.8

## 2018-03-13 MED ORDER — IBUPROFEN 200 MG PO TABS
600.0000 mg | ORAL_TABLET | Freq: Once | ORAL | Status: AC
  Administered 2018-03-13: 600 mg via ORAL
  Filled 2018-03-13: qty 3

## 2018-03-13 MED FILL — METOPROLOL SUCCINATE ER 50: 50 | 90 days supply | Qty: 90 | Fill #0

## 2018-03-13 NOTE — ED Provider Notes (Signed)
[facility] DEPT Provider Note   CSN: [id number] Arrival date & time: 03/13/18  0810     History   Chief Complaint Chief Complaint  Patient presents with  . Head Injury    HPI Anne Horton is a 42 y.o. female.  HPI 42 year old female with past medical history as below here with head injury.  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  She was cleaning up when she stood up quickly to leave her room.  She states she hit her head on the corner of a magazine holder on the door.  She reports immediate onset of sharp, stabbing, frontal scalp pain.  She did not lose consciousness.  She is not on blood thinners.  She is had a moderate amount of bleeding that is controlled with pressure.  Denies any neck pain.  No other injuries.  No vision changes.  No history of poor wound healing.  Pain worsens with palpation.  No alleviating factors.  Has not tried anything.  Past Medical History:  Diagnosis Date  . Allergy   . Anemia   . Anxiety   . Chest pain   . Depression   . Fibromyalgia   . H/O: depression   . Hx of migraines   . Mitral regurgitation    mild per echo EF 55-60%  . Neurofibromatosis ([place])   . Palpitations   . Tachycardia    Hx. of  . Wears contact lenses     Patient Active Problem List   Diagnosis Date Noted  . Anemia, iron deficiency 10/20/2014  . Chest pain   . Palpitations   . Neurofibromatosis ([place])   . Tachycardia   . Hx of migraines   . H/O: depression   . Mitral regurgitation   . BLOOD IN STOOL 01/12/2009  . BLOOD IN STOOL, OCCULT 01/12/2009    Past Surgical History:  Procedure Laterality Date  . CESAREAN SECTION    . CHOLECYSTECTOMY N/A 01/04/2015   Procedure: LAPAROSCOPIC CHOLECYSTECTOMY WITH INTRAOPERATIVE CHOLANGIOGRAM;  Surgeon: Erroll Luna, MD;  Location: [facility];  Service: General;  Laterality: N/A;  . COLONOSCOPY    . ESOPHAGOGASTRODUODENOSCOPY    . WISDOM TOOTH EXTRACTION        OB History   None      Home Medications    Prior to Admission medications   Medication Sig Start Date End Date Taking? Authorizing Provider  ALPRAZolam (XANAX) 0.5 MG tablet Take 0.5 mg by mouth at bedtime as needed (FOR ANXIETY). :take 1/2 to 1 twice a day as needed 06/16/14  Yes [provider]  cetirizine (ZYRTEC) 10 MG tablet Take 10 mg by mouth as needed for allergies.    Yes [provider]  DULoxetine (CYMBALTA) 60 MG capsule Take 60 mg by mouth daily.    Yes [provider]  ibuprofen (ADVIL,MOTRIN) 200 MG tablet Take 800 mg by mouth daily as needed for headache.   Yes [provider]  metoprolol succinate (TOPROL-XL) 50 MG 24 hr tablet Take 1 tablet (50 mg total) by mouth daily. Take with or immediately following a meal. 12/17/17  Yes [name], Wonda Cheng, MD  propranolol (INDERAL) 10 MG tablet TAKE 1 TABLET BY MOUTH FOUR TIMES DAILY AS NEEDED FOR PALPITATIONS 01/24/16  Yes [name], Wonda Cheng, MD    Family History Family History  Problem Relation Age of Onset  . Breast cancer Mother   . Diabetes Mother   . Cancer Mother   .  Neurofibromatosis Father   . Atrial fibrillation Maternal Grandfather   . Cancer Paternal Grandmother   . Heart attack Neg Hx   . Hypertension Neg Hx   . Stroke Neg Hx     Social History Social History   Tobacco Use  . Smoking status: Never Smoker  . Smokeless tobacco: Never Used  Substance Use Topics  . Alcohol use: Yes    Alcohol/week: 0.0 oz    Comment: occasional but rarely  . Drug use: No     Allergies   Latex   Review of Systems Review of Systems  Constitutional: Negative for chills and fever.  Respiratory: Negative for shortness of breath.   Cardiovascular: Negative for chest pain.  Musculoskeletal: Negative for neck pain.  Skin: Positive for wound. Negative for rash.  Allergic/Immunologic: Negative for immunocompromised state.  Neurological: Negative for weakness and numbness.    Hematological: Does not bruise/bleed easily.     Physical Exam Updated Vital Signs BP (!) 143/76 (BP Location: Left Arm)   Pulse 82   Temp 98.4 F (36.9 C) (Oral)   Resp 14   Ht 5\' 2"  (1.575 m)   Wt 65.3 kg (144 lb)   LMP 03/12/2018   SpO2 100%   BMI 26.34 kg/m   Physical Exam  Constitutional: She is oriented to person, place, and time. She appears well-developed and well-nourished. No distress.  HENT:  Head: Normocephalic and atraumatic.  Approximately 3 cm linear, superficial laceration to the midline frontal scalp.  No exposed bone.  No palpable crepitance.  Eyes: Conjunctivae are normal.  Neck: Neck supple.  No neck pain or stiffness.  No rigidity.  Cardiovascular: Normal rate, regular rhythm and normal heart sounds.  Pulmonary/Chest: Effort normal. No respiratory distress. She has no wheezes.  Abdominal: She exhibits no distension.  Musculoskeletal: She exhibits no edema.  Neurological: She is alert and oriented to person, place, and time. She exhibits normal muscle tone.  Alert, oriented.  No focal neurological deficits.  Cranial nerves intact.  Strength 5 out of 5.  Normal gait.  Skin: Skin is warm. Capillary refill takes less than 2 seconds. No rash noted.  Nursing note and vitals reviewed.    ED Treatments / Results  Labs (all labs ordered are listed, but only abnormal results are displayed) Labs Reviewed - No data to display  EKG None  Radiology No results found.  Procedures .Marland KitchenLaceration Repair Date/Time: 03/13/2018 9:16 AM Performed by: Duffy Bruce, MD Authorized by: Duffy Bruce, MD   Consent:    Consent obtained:  Verbal   Consent given by:  Patient   Risks discussed:  Infection, need for additional repair, pain, tendon damage, retained foreign body, vascular damage, poor cosmetic result, poor wound healing and nerve damage   Alternatives discussed:  Referral and delayed treatment Anesthesia (see MAR for exact dosages):    Anesthesia  method:  Local infiltration   Local anesthetic:  Lidocaine 1% WITH epi Laceration details:    Location:  Scalp   Scalp location:  Frontal   Length (cm):  4 Pre-procedure details:    Preparation:  Patient was prepped and draped in usual sterile fashion and imaging obtained to evaluate for foreign bodies Exploration:    Hemostasis achieved with:  Direct pressure   Wound exploration: wound explored through full range of motion and entire depth of wound probed and visualized   Treatment:    Area cleansed with:  Betadine   Amount of cleaning:  Extensive   Irrigation solution:  Sterile water   Irrigation method:  Pressure wash Skin repair:    Repair method:  Staples   Number of staples:  8 Approximation:    Approximation:  Close Post-procedure details:    Dressing:  Antibiotic ointment   Patient tolerance of procedure:  Tolerated well, no immediate complications   (including critical care time)  Medications Ordered in ED Medications  lidocaine-EPINEPHrine (XYLOCAINE W/EPI) 2 %-1:200000 (PF) injection 10 mL (10 mLs Intradermal Given 03/13/18 0844)  ibuprofen (ADVIL,MOTRIN) tablet 600 mg (600 mg Oral Given 03/13/18 0843)     Initial Impression / Assessment and Plan / ED Course  I have reviewed the triage vital signs and the nursing notes.  Pertinent labs & imaging results that were available during my care of the patient were reviewed by me and considered in my medical decision making (see chart for details).     42 year old female here with superficial frontal scalp laceration.  No loss of consciousness.  Tetanus up-to-date.  Patient is not on blood thinners.  No headache, visual changes, or symptoms to suggest intracranial injury.  Laceration was cleaned and repaired as above.  Will apply antibiotic ointment, discharged with outpatient follow-up.  Final Clinical Impressions(s) / ED Diagnoses   Final diagnoses:  Laceration of scalp without foreign body, initial encounter    ED  Discharge Orders    None       Duffy Bruce, MD 03/13/18 [phone]

## 2018-03-13 NOTE — Discharge Instructions (Signed)
Apply antibiotic ointment to the wound at least twice daily.  It is okay to shower but do not submerge your head underwater or swim until staples are removed.  Take over-the-counter Tylenol and Advil as needed for pain.

## 2018-03-13 NOTE — ED Triage Notes (Signed)
Patient states she bent to put something in the trash and hit a magazine rack on the door. Patient has a laceration to the head. Bleeding controlled.

## 2018-03-16 DIAGNOSIS — R928 Other abnormal and inconclusive findings on diagnostic imaging of breast: Secondary | ICD-10-CM | POA: Diagnosis not present

## 2018-03-16 DIAGNOSIS — R922 Inconclusive mammogram: Secondary | ICD-10-CM | POA: Diagnosis not present

## 2018-03-16 DIAGNOSIS — N6489 Other specified disorders of breast: Secondary | ICD-10-CM | POA: Diagnosis not present

## 2018-03-17 ENCOUNTER — Other Ambulatory Visit: Payer: Self-pay | Admitting: Family Medicine

## 2018-03-17 DIAGNOSIS — N6320 Unspecified lump in the left breast, unspecified quadrant: Secondary | ICD-10-CM

## 2018-03-20 ENCOUNTER — Ambulatory Visit
Admission: RE | Admit: 2018-03-20 | Discharge: 2018-03-20 | Disposition: A | Discharge status: Home / Self Care | Payer: 59 | Source: Ambulatory Visit | Attending: Family Medicine | Admitting: Family Medicine

## 2018-03-20 DIAGNOSIS — N6321 Unspecified lump in the left breast, upper outer quadrant: Secondary | ICD-10-CM | POA: Diagnosis not present

## 2018-03-20 DIAGNOSIS — C50412 Malignant neoplasm of upper-outer quadrant of left female breast: Secondary | ICD-10-CM | POA: Diagnosis not present

## 2018-03-20 DIAGNOSIS — N6320 Unspecified lump in the left breast, unspecified quadrant: Secondary | ICD-10-CM

## 2018-03-22 MED FILL — ALPRAZolam 0.5 MG TABS: 0.5 | 30 days supply | Qty: 60 | Fill #2

## 2018-03-24 ENCOUNTER — Inpatient Hospital Stay: Payer: 59 | Attending: Genetic Counselor | Admitting: Genetics

## 2018-03-24 ENCOUNTER — Encounter: Payer: Self-pay | Admitting: Genetics

## 2018-03-24 ENCOUNTER — Inpatient Hospital Stay: Payer: 59

## 2018-03-24 ENCOUNTER — Other Ambulatory Visit: Payer: Self-pay

## 2018-03-24 DIAGNOSIS — Z803 Family history of malignant neoplasm of breast: Secondary | ICD-10-CM | POA: Insufficient documentation

## 2018-03-24 DIAGNOSIS — C50412 Malignant neoplasm of upper-outer quadrant of left female breast: Secondary | ICD-10-CM | POA: Insufficient documentation

## 2018-03-24 DIAGNOSIS — D508 Other iron deficiency anemias: Secondary | ICD-10-CM | POA: Diagnosis not present

## 2018-03-24 DIAGNOSIS — Z170 Estrogen receptor positive status [ER+]: Secondary | ICD-10-CM | POA: Insufficient documentation

## 2018-03-24 DIAGNOSIS — Z853 Personal history of malignant neoplasm of breast: Secondary | ICD-10-CM

## 2018-03-24 DIAGNOSIS — C50919 Malignant neoplasm of unspecified site of unspecified female breast: Secondary | ICD-10-CM | POA: Diagnosis not present

## 2018-03-24 DIAGNOSIS — Z808 Family history of malignant neoplasm of other organs or systems: Secondary | ICD-10-CM | POA: Diagnosis not present

## 2018-03-24 LAB — CBC WITH DIFFERENTIAL (CANCER CENTER ONLY)
BASOS ABS: 0.2 10*3/uL — AB (ref 0.0–0.1)
Basophils Relative: 2 %
EOS ABS: 0.7 10*3/uL — AB (ref 0.0–0.5)
Eosinophils Relative: 10 %
HCT: 32.2 % — ABNORMAL LOW (ref 34.8–46.6)
HEMOGLOBIN: 10.1 g/dL — AB (ref 11.6–15.9)
Lymphocytes Relative: 28 %
Lymphs Abs: 2.2 10*3/uL (ref 0.9–3.3)
MCH: 24.4 pg — ABNORMAL LOW (ref 25.1–34.0)
MCHC: 31.4 g/dL — AB (ref 31.5–36.0)
MCV: 77.8 fL — ABNORMAL LOW (ref 79.5–101.0)
MONOS PCT: 8 %
Monocytes Absolute: 0.6 10*3/uL (ref 0.1–0.9)
NEUTROS ABS: 4.1 10*3/uL (ref 1.5–6.5)
NEUTROS PCT: 52 %
Platelet Count: 424 10*3/uL — ABNORMAL HIGH (ref 145–400)
RBC: 4.14 MIL/uL (ref 3.70–5.45)
RDW: 13.8 % (ref 11.2–14.5)
WBC Count: 7.8 10*3/uL (ref 3.9–10.3)

## 2018-03-24 LAB — CMP (CANCER CENTER ONLY)
ALK PHOS: 71 U/L (ref 38–126)
ALT: 21 U/L (ref 0–44)
ANION GAP: 6 (ref 5–15)
AST: 21 U/L (ref 15–41)
Albumin: 4.1 g/dL (ref 3.5–5.0)
BILIRUBIN TOTAL: 0.3 mg/dL (ref 0.3–1.2)
BUN: 10 mg/dL (ref 6–20)
CALCIUM: 9.4 mg/dL (ref 8.9–10.3)
CO2: 30 mmol/L (ref 22–32)
CREATININE: 0.74 mg/dL (ref 0.44–1.00)
Chloride: 102 mmol/L (ref 98–111)
Glucose, Bld: 87 mg/dL (ref 70–99)
Potassium: 4.7 mmol/L (ref 3.5–5.1)
SODIUM: 138 mmol/L (ref 135–145)
TOTAL PROTEIN: 7.1 g/dL (ref 6.5–8.1)

## 2018-03-24 NOTE — Progress Notes (Signed)
REFERRING PROVIDER: Nicholas Lose, MD [address] [place], [place] [address]  PRIMARY PROVIDER:  Osie Cheeks, PA-C  PRIMARY REASON FOR VISIT:  1. Malignant neoplasm of female breast, unspecified estrogen receptor status, unspecified laterality, unspecified site of breast ([place])   2. Family history of breast cancer    HISTORY OF PRESENT ILLNESS:   Anne Horton, a 42 y.o. female, was seen for a [facility] cancer genetics consultation at the request of Dr. Lindi Adie due to a personal and family history of breast cancer.  Anne Horton presents to clinic today to discuss the possibility of a hereditary predisposition to cancer, genetic testing, and to further clarify her future cancer risks, as well as potential cancer risks for family members.   On 03/20/2018, at the age of 1, Anne Horton was diagnosed with Invasive ductal carcinoma with DCIS of the left breast. She is currently considering treatment options at this time.  Anne Horton has a diagnosis of Neurofibromatosis and a mutation identified in NF1.  (clinical notes available for review, actual genetic test report unavailable for review).   HORMONAL RISK FACTORS:  Menarche was at age 20-13.  First live birth at age 57.  OCP use for approximately 20 years.  Ovaries intact: yes.  Hysterectomy: no.  Menopausal status: premenopausal.  HRT use: 0 years. Colonoscopy: yes; normal. Mammogram within the last year: yes.  Past Medical History:  Diagnosis Date  . Allergy   . Anemia   . Anxiety   . Chest pain   . Depression   . Family history of breast cancer   . Fibromyalgia   . H/O: depression   . Hx of migraines   . Mitral regurgitation    mild per echo EF 55-60%  . Neurofibromatosis ([place])   . Palpitations   . Tachycardia    Hx. of  . Wears contact lenses     Past Surgical History:  Procedure Laterality Date  . CESAREAN SECTION    . CHOLECYSTECTOMY N/A 01/04/2015   Procedure: LAPAROSCOPIC CHOLECYSTECTOMY WITH  INTRAOPERATIVE CHOLANGIOGRAM;  Surgeon: Erroll Luna, MD;  Location: [facility];  Service: General;  Laterality: N/A;  . COLONOSCOPY    . ESOPHAGOGASTRODUODENOSCOPY    . WISDOM TOOTH EXTRACTION      Social History   Socioeconomic History  . Marital status: Married    Spouse name: Not on file  . Number of children: 1  . Years of education: Not on file  . Highest education level: Not on file  Occupational History  . Occupation: [employer]  Social Needs  . Financial resource strain: Not on file  . Food insecurity:    Worry: Not on file    Inability: Not on file  . Transportation needs:    Medical: Not on file    Non-medical: Not on file  Tobacco Use  . Smoking status: Never Smoker  . Smokeless tobacco: Never Used  Substance and Sexual Activity  . Alcohol use: Yes    Alcohol/week: 0.0 oz    Comment: occasional but rarely  . Drug use: No  . Sexual activity: Yes    Birth control/protection: None  Lifestyle  . Physical activity:    Days per week: Not on file    Minutes per session: Not on file  . Stress: Not on file  Relationships  . Social connections:    Talks on phone: Not on file    Gets together: Not on file    Attends religious service: Not on  file    Active member of club or organization: Not on file    Attends meetings of clubs or organizations: Not on file    Relationship status: Not on file  Other Topics Concern  . Not on file  Social History Narrative  . Not on file     FAMILY HISTORY:  We obtained a detailed, 4-generation family history.  Significant diagnoses are listed below: Family History  Problem Relation Age of Onset  . Breast cancer Mother 51       estrogen negative, recurred in 2012  . Diabetes Mother   . Neurofibromatosis Father   . Atrial fibrillation Maternal Grandfather   . Cancer Paternal Grandmother        type unk, died at 64  . Neurofibromatosis Paternal Grandmother   . Stroke Paternal Grandfather   . Heart disease  Paternal Uncle   . Diabetes Paternal Uncle   . Heart attack Neg Hx   . Hypertension Neg Hx     Anne Horton has a 23 year-old daughter.  Anne Horton is an only child.   Anne Horton's father: 62, has dx of NF1, no history of cancer.  Paternal aunts/Uncles: 1 paternal uncle died due to heart disease/ diabetes. Paternal cousins: 2 paternal cousins, 1 is deceased, no history of cancer.  Paternal grandfather: deceased due to stroke Paternal grandmother:died at 4 die to metastatic cancer, unk primary.  She also had a hx of NF1  Anne Horton mother: dx with breast cancer at 26.  In 2012 breast cancer came back in same breast and same location.  Her breast cancer was estrogen negative.  She is unsure if she ever had genetic testing.  She also had tongue cancer and a hysterectomy at the age of 39.  Maternal Aunts/Uncles: 2 maternal aunts and 1 maternal uncle in their 94's/60's with no history of cancer.  Maternal cousins: no history of cancer.  Maternal grandfather: died >50 due to infection.  He had a brother who died of cancer.  Maternal grandmother:died at 9 due to a motor vehicle accident.  No reported hx of cancer on her side of the family .  Patient's maternal ancestors are of Scottish/Irish/Native American descent, and paternal ancestors are of German/Polish descent. There is no reported Ashkenazi Jewish ancestry. There is no known consanguinity.  GENETIC COUNSELING ASSESSMENT: Anne Horton is a 42 y.o. female with a personal and family history which is somewhat suggestive of a Hereditary Cancer Predisposition Syndrome. We, therefore, discussed and recommended the following at today's visit.   DISCUSSION: We reviewed the characteristics, features and inheritance patterns of hereditary cancer syndromes. We also discussed genetic testing, including the appropriate family members to test, the process of testing, insurance coverage and turn-around-time for results. We discussed the implications of  a negative, positive and/or variant of uncertain significant result. We recommended Anne Horton pursue genetic testing for the Common Hereditary Cancers gene panel.   The Common Hereditary Cancer Panel offered by [facility] includes sequencing and/or deletion duplication testing of the following 47 genes: APC, ATM, AXIN2, BARD1, BMPR1A, BRCA1, BRCA2, BRIP1, CDH1, CDKN2A (p14ARF), CDKN2A (p16INK4a), CKD4, CHEK2, CTNNA1, DICER1, EPCAM (Deletion/duplication testing only), GREM1 (promoter region deletion/duplication testing only), KIT, MEN1, MLH1, MSH2, MSH3, MSH6, MUTYH, NBN, NF1, NHTL1, PALB2, PDGFRA, PMS2, POLD1, POLE, PTEN, RAD50, RAD51C, RAD51D, SDHB, SDHC, SDHD, SMAD4, SMARCA4. STK11, TP53, TSC1, TSC2, and VHL.  The following genes were evaluated for sequence changes only: SDHA and HOXB13 c.251G>A variant only.  We discussed that only  5-10% of cancers are associated with a Hereditary cancer predisposition syndrome.  One of the most common hereditary cancer syndromes that increases breast cancer risk is called Hereditary Breast and Ovarian Cancer (HBOC) syndrome.  This syndrome is caused by mutations in the BRCA1 and BRCA2 genes.  This syndrome increases an individual's lifetime risk to develop breast, ovarian, pancreatic, and other types of cancer.  There are also many other cancer predisposition syndromes caused by mutations in several other genes.  We discussed that mutations in NF1 are associated with an increased risk for breast cancer (particularly in women <50years.)  Her NF1 mutation could have played a role in the development of her breast cancer. However, the NF1 mutation was inherited from her father and there is significant family history of breast cancer in her mother.  We discussed, the possibility for multiple genetic mutations to be detected/playing a role in her cancer risk.   We discussed that if she is found to have a mutation in one of these genes, it may impact surgical decisions, and alter  future medical management recommendations such as increased cancer screenings and consideration of risk reducing surgeries.  A positive result could also have implications for the patient's family members.  A Negative result (aside from known NF1 mutation) would mean we did not identify any additional genetic mutations contributing to her cancer risk. However, we discussed genetic testing is not perfect and cannot rule out the possibility of a hereditary predisposition to cancer (beyond her known NF1 mutation).  There could be mutations that are undetectable by current technology, or in genes not yet tested or identified to increase cancer risk.    We discussed the potential to find a Variant of Uncertain Significance or VUS.  These are variants that have not yet been identified as pathogenic or benign, and it is unknown if this variant is associated with increased cancer risk or if this is a normal finding.  Most VUS's are reclassified to benign or likely benign.   It should not be used to make medical management decisions. With time, we suspect the lab will determine the significance of any VUS's identified if any.   Based on Anne Horton's personal and family history of cancer, she meets NCCN medical criteria for genetic testing. Despite that she meets criteria, she may still have an out of pocket cost. The laboratory can provide her with an estimate of her OOP cost.   PLAN: After considering the risks, benefits, and limitations, Anne Horton  provided informed consent to pursue genetic testing and the blood sample was sent to [facility] for analysis of the Common Hereditary Cancers Panel. Results should be available within approximately 2-3 weeks' time, at which point they will be disclosed by telephone to Anne Horton, as will any additional recommendations warranted by these results. Anne Horton will receive a summary of her genetic counseling visit and a copy of her results once available. This  information will also be available in Epic. We encouraged Anne Horton to remain in contact with cancer genetics annually so that we can continuously update the family history and inform her of any changes in cancer genetics and testing that may be of benefit for her family. Anne Horton questions were answered to her satisfaction today. Our contact information was provided should additional questions or concerns arise.  Based on Anne Horton family history, we recommended her mother (or maternal relatives if mother is not interested/decliens), also have genetic counseling and testing.  It is possible  there was a hereditary cause for her mother's young breast cancer that was not passed on to Anne Horton and would not be found in her genetic testing.   Ms. Fancher will let us know if we can be of any assistance in coordinating genetic counseling and/or testing for this family member.   Lastly, we encouraged Ms. Czaja to remain in contact with cancer genetics annually so that we can continuously update the family history and inform her of any changes in cancer genetics and testing that may be of benefit for this family.   Ms.  Simmon questions were answered to her satisfaction today. Our contact information was provided should additional questions or concerns arise. Thank you for the referral and allowing [place] to share in the care of your patient.   Tana Felts, MS, [facility] Certified Genetic Counselor [email or web address] phone: [phone]  The patient was seen for a total of 40 minutes in face-to-face genetic counseling.  The patient was accompanied today by her mother and father. This patient was discussed with Drs. Magrinat, Lindi Adie and/or Burr Medico who agrees with the above.

## 2018-03-25 ENCOUNTER — Encounter: Payer: Self-pay | Admitting: *Deleted

## 2018-03-25 ENCOUNTER — Other Ambulatory Visit: Payer: Self-pay | Admitting: *Deleted

## 2018-03-25 ENCOUNTER — Inpatient Hospital Stay: Payer: 59 | Admitting: Hematology and Oncology

## 2018-03-25 DIAGNOSIS — Z170 Estrogen receptor positive status [ER+]: Secondary | ICD-10-CM | POA: Diagnosis not present

## 2018-03-25 DIAGNOSIS — C50412 Malignant neoplasm of upper-outer quadrant of left female breast: Secondary | ICD-10-CM | POA: Diagnosis not present

## 2018-03-25 DIAGNOSIS — D508 Other iron deficiency anemias: Secondary | ICD-10-CM | POA: Diagnosis not present

## 2018-03-25 DIAGNOSIS — F330 Major depressive disorder, recurrent, mild: Secondary | ICD-10-CM | POA: Diagnosis not present

## 2018-03-25 DIAGNOSIS — Z803 Family history of malignant neoplasm of breast: Secondary | ICD-10-CM | POA: Diagnosis not present

## 2018-03-25 MED FILL — DULoxetine HCL 60 MG CPEP: 60 | 30 days supply | Qty: 60 | Fill #0

## 2018-03-25 NOTE — Assessment & Plan Note (Signed)
03/20/2018 Screening detected left breast spiculated mass by ultrasound measured 1.2 cm.  Biopsy revealed invasive ductal carcinoma grade 1-2 with high-grade DCIS, ER 95%, PR 95%, Ki-67 1%, HER-2 negative ratio 1.47, T1CN0 stage I a clinical stage.  Pathology and radiology counseling:Discussed with the patient, the details of pathology including the type of breast cancer,the clinical staging, the significance of ER, PR and HER-2/neu receptors and the implications for treatment. After reviewing the pathology in detail, we proceeded to discuss the different treatment options between surgery, radiation, chemotherapy, antiestrogen therapies.  Genetic testing was performed yesterday and results are pending.  She tells me that if she has a genetic mutation and she will undergo bilateral salpingo-nephrectomy.  She has not decided on what she would do with her breast.  Recommendations: 1. Breast conserving surgery followed by 2. Oncotype DX testing to determine if chemotherapy would be of any benefit followed by 3. Adjuvant radiation therapy followed by 4. Adjuvant antiestrogen therapy  Oncotype counseling: I discussed Oncotype DX test. I explained to the patient that this is a 21 gene panel to evaluate patient tumors DNA to calculate recurrence score. This would help determine whether patient has high risk or intermediate risk or low risk breast cancer. She understands that if her tumor was found to be high risk, she would benefit from systemic chemotherapy. If low risk, no need of chemotherapy. If she was found to be intermediate risk, we would need to evaluate the score as well as other risk factors and determine if an abbreviated chemotherapy may be of benefit.  Return to clinic after surgery to discuss final pathology report and then determine if Oncotype DX testing will need to be sent.

## 2018-03-25 NOTE — Progress Notes (Signed)
f °

## 2018-03-25 NOTE — Progress Notes (Signed)
[name] CONSULT NOTE  Patient Care Team: Anne Horton as PCP - General (Family Medicine) [name], Wonda Cheng, MD as PCP - Cardiology (Cardiology)  CHIEF COMPLAINTS/PURPOSE OF CONSULTATION:  Newly diagnosed breast cancer  HISTORY OF PRESENTING ILLNESS:  Anne Horton 42 y.o. female is here because of recent diagnosis of left breast cancer.  Patient had a spiculated mass in the left breast detected on screening mammogram.  This led to additional evaluations including ultrasound which revealed 1.2 cm mass in the left breast.  She underwent ultrasound-guided biopsy of the left breast mass and pathology revealed grade 1-2 invasive ductal carcinoma that is ER PR positive and HER-2 negative with a Ki-67 of 1%.  She was presented this morning a multidisciplinary clinic and she is here today to discuss her treatment plan.  I reviewed her records extensively and collaborated the history with the patient.  SUMMARY OF ONCOLOGIC HISTORY:   Malignant neoplasm of upper-outer quadrant of left breast in female, estrogen receptor positive ([place])   03/20/2018 Initial Diagnosis    Screening detected left breast spiculated mass by ultrasound measured 1.2 cm.  Biopsy revealed invasive ductal carcinoma grade 1-2 with high-grade DCIS, ER 95%, PR 95%, Ki-67 1%, HER-2 negative ratio 1.47, T1CN0 stage I a clinical stage        MEDICAL HISTORY:  Past Medical History:  Diagnosis Date  . Allergy   . Anemia   . Anxiety   . Chest pain   . Depression   . Family history of breast cancer   . Fibromyalgia   . H/O: depression   . Hx of migraines   . Mitral regurgitation    mild per echo EF 55-60%  . Neurofibromatosis ([place])   . Palpitations   . Tachycardia    Hx. of  . Wears contact lenses     SURGICAL HISTORY: Past Surgical History:  Procedure Laterality Date  . CESAREAN SECTION    . CHOLECYSTECTOMY N/A 01/04/2015   Procedure: LAPAROSCOPIC CHOLECYSTECTOMY WITH INTRAOPERATIVE  CHOLANGIOGRAM;  Surgeon: Erroll Luna, MD;  Location: [facility];  Service: General;  Laterality: N/A;  . COLONOSCOPY    . ESOPHAGOGASTRODUODENOSCOPY    . WISDOM TOOTH EXTRACTION      SOCIAL HISTORY: Social History   Socioeconomic History  . Marital status: Married    Spouse name: Not on file  . Number of children: 1  . Years of education: Not on file  . Highest education level: Not on file  Occupational History  . Occupation: [employer]  Social Needs  . Financial resource strain: Not on file  . Food insecurity:    Worry: Not on file    Inability: Not on file  . Transportation needs:    Medical: Not on file    Non-medical: Not on file  Tobacco Use  . Smoking status: Never Smoker  . Smokeless tobacco: Never Used  Substance and Sexual Activity  . Alcohol use: Yes    Alcohol/week: 0.0 oz    Comment: occasional but rarely  . Drug use: No  . Sexual activity: Yes    Birth control/protection: None  Lifestyle  . Physical activity:    Days per week: Not on file    Minutes per session: Not on file  . Stress: Not on file  Relationships  . Social connections:    Talks on phone: Not on file    Gets together: Not on file    Attends religious service: Not on file  Active member of club or organization: Not on file    Attends meetings of clubs or organizations: Not on file    Relationship status: Not on file  . Intimate partner violence:    Fear of current or ex partner: Not on file    Emotionally abused: Not on file    Physically abused: Not on file    Forced sexual activity: Not on file  Other Topics Concern  . Not on file  Social History Narrative  . Not on file    FAMILY HISTORY: Family History  Problem Relation Age of Onset  . Breast cancer Mother 38       estrogen negative, recurred in 2012  . Diabetes Mother   . Neurofibromatosis Father   . Atrial fibrillation Maternal Grandfather   . Cancer Paternal Grandmother        type unk, died at 5  .  Neurofibromatosis Paternal Grandmother   . Stroke Paternal Grandfather   . Heart disease Paternal Uncle   . Diabetes Paternal Uncle   . Heart attack Neg Hx   . Hypertension Neg Hx     ALLERGIES:  is allergic to latex.  MEDICATIONS:  Current Outpatient Medications  Medication Sig Dispense Refill  . ALPRAZolam (XANAX) 0.5 MG tablet Take 0.5 mg by mouth at bedtime as needed (FOR ANXIETY). :take 1/2 to 1 twice a day as needed    . cetirizine (ZYRTEC) 10 MG tablet Take 10 mg by mouth as needed for allergies.     . DULoxetine (CYMBALTA) 60 MG capsule Take 60 mg by mouth daily.     Marland Kitchen ibuprofen (ADVIL,MOTRIN) 200 MG tablet Take 800 mg by mouth daily as needed for headache.    . metoprolol succinate (TOPROL-XL) 50 MG 24 hr tablet TAKE 1 TABLET BY MOUTH DAILY. TAKE WITH OR IMMEDIATELY FOLLOWING A MEAL. 90 tablet 3  . propranolol (INDERAL) 10 MG tablet TAKE 1 TABLET BY MOUTH FOUR TIMES DAILY AS NEEDED FOR PALPITATIONS 30 tablet 2   No current facility-administered medications for this visit.     REVIEW OF SYSTEMS:   Constitutional: Denies fevers, chills or abnormal night sweats Eyes: Denies blurriness of vision, double vision or watery eyes Ears, nose, mouth, throat, and face: Denies mucositis or sore throat Respiratory: Denies cough, dyspnea or wheezes Cardiovascular: Denies palpitation, chest discomfort or lower extremity swelling Gastrointestinal:  Denies nausea, heartburn or change in bowel habits Skin: Denies abnormal skin rashes Lymphatics: Denies new lymphadenopathy or easy bruising Neurological:Denies numbness, tingling or new weaknesses Behavioral/Psych: Mood is stable, no new changes  Breast:  Denies any palpable lumps or discharge All other systems were reviewed with the patient and are negative.  PHYSICAL EXAMINATION: ECOG PERFORMANCE STATUS: 0 - Asymptomatic  Vitals:   03/25/18 1303  BP: 118/62  Pulse: 79  Resp: 18  Temp: 98.1 F (36.7 C)  SpO2: 100%   Filed Weights    03/25/18 1303  Weight: 144 lb 1.6 oz (65.4 kg)    GENERAL:alert, no distress and comfortable SKIN: skin color, texture, turgor are normal, no rashes or significant lesions EYES: normal, conjunctiva are pink and non-injected, sclera clear OROPHARYNX:no exudate, no erythema and lips, buccal mucosa, and tongue normal  NECK: supple, thyroid normal size, non-tender, without nodularity LYMPH:  no palpable lymphadenopathy in the cervical, axillary or inguinal LUNGS: clear to auscultation and percussion with normal breathing effort HEART: regular rate & rhythm and no murmurs and no lower extremity edema ABDOMEN:abdomen soft, non-tender and normal bowel sounds  Musculoskeletal:no cyanosis of digits and no clubbing  PSYCH: alert & oriented x 3 with fluent speech NEURO: no focal motor/sensory deficits BREAST: No palpable nodules in breast. No palpable axillary or supraclavicular lymphadenopathy (exam performed in the presence of a chaperone)   LABORATORY DATA:  I have reviewed the data as listed Lab Results  Component Value Date   WBC 7.8 03/24/2018   HGB 10.1 (L) 03/24/2018   HCT 32.2 (L) 03/24/2018   MCV 77.8 (L) 03/24/2018   PLT 424 (H) 03/24/2018   Lab Results  Component Value Date   NA 138 03/24/2018   K 4.7 03/24/2018   CL 102 03/24/2018   CO2 30 03/24/2018    RADIOGRAPHIC STUDIES: I have personally reviewed the radiological reports and agreed with the findings in the report.  ASSESSMENT AND PLAN:  Malignant neoplasm of upper-outer quadrant of left breast in female, estrogen receptor positive ([place]) 03/20/2018 Screening detected left breast spiculated mass by ultrasound measured 1.2 cm.  Biopsy revealed invasive ductal carcinoma grade 1-2 with high-grade DCIS, ER 95%, PR 95%, Ki-67 1%, HER-2 negative ratio 1.47, T1CN0 stage I a clinical stage.  Pathology and radiology counseling:Discussed with the patient, the details of pathology including the type of breast cancer,the clinical  staging, the significance of ER, PR and HER-2/neu receptors and the implications for treatment. After reviewing the pathology in detail, we proceeded to discuss the different treatment options between surgery, radiation, chemotherapy, antiestrogen therapies.  Genetic testing was performed yesterday and results are pending.  She tells me that if she has a genetic mutation and she will undergo bilateral salpingo-nephrectomy.  She has not decided on what she would do with her breast.  Recommendations: 1. Breast conserving surgery followed by 2. Oncotype DX testing to determine if chemotherapy would be of any benefit followed by 3. Adjuvant radiation therapy followed by 4. Adjuvant antiestrogen therapy  Oncotype counseling: I discussed Oncotype DX test. I explained to the patient that this is a 21 gene panel to evaluate patient tumors DNA to calculate recurrence score. This would help determine whether patient has high risk or intermediate risk or low risk breast cancer. She understands that if her tumor was found to be high risk, she would benefit from systemic chemotherapy. If low risk, no need of chemotherapy. If she was found to be intermediate risk, we would need to evaluate the score as well as other risk factors and determine if an abbreviated chemotherapy may be of benefit.  Return to clinic after surgery to discuss final pathology report and then determine if Oncotype DX testing will need to be sent.  All questions were answered. The patient knows to call the clinic with any problems, questions or concerns.    Harriette Ohara, MD 03/25/18

## 2018-03-26 ENCOUNTER — Encounter: Payer: Self-pay | Admitting: Radiation Oncology

## 2018-03-26 ENCOUNTER — Encounter: Payer: Self-pay | Admitting: Cardiovascular Disease

## 2018-03-27 ENCOUNTER — Other Ambulatory Visit: Payer: Self-pay | Admitting: *Deleted

## 2018-03-27 MED ORDER — PROPRANOLOL HCL 10 MG PO TABS: ORAL_TABLET | ORAL | 3 refills | Status: DC

## 2018-03-27 MED FILL — PROPRANOLOL 10 MG TABLET: 10 | 30 days supply | Qty: 120 | Fill #0

## 2018-03-27 NOTE — Progress Notes (Addendum)
Location of Breast Cancer: LEFT breast  Histology per Pathology Report: Breast, left, needle core biopsy, 2 o'clock - INVASIVE DUCTAL CARCINOMA, GRADE 1-2. - DUCTAL CARCINOMA IN SITU (DCIS), HIGH NUCLEAR GRADE  Receptor Status: ER(95%), PR (95%), Her2-neu (negative), Ki-(1%)  Did patient present with symptoms or was this found on screening mammography?: Screening detected left breast spiculated mass by ultrasound measured 1.2 cm.  Biopsy revealed invasive ductal carcinoma grade 1-2 with high-grade DCIS, ER 95%, PR 95%, Ki-67 1%, HER-2 negative ratio 1.47, T1CN0 stage I a clinical stage  Past/Anticipated interventions by surgeon, if any: Pt has seen Dr. Donne Hazel. MRI-guided biopsy on 04/17/18  Past/Anticipated interventions by medical oncology, if any: Chemotherapy Per Dr. Lindi Adie 03/25/18:  Recommendations: 1. Breast conserving surgery followed by 2. Oncotype DX testing to determine if chemotherapy would be of any benefit followed by 3. Adjuvant radiation therapy followed by 4. Adjuvant antiestrogen therapy    Lymphedema issues, if any:  Pt has not had surgery at this time.   Pain issues, if any:  Pt reports throbbing pain of 1/10 in LEFT breast  SAFETY ISSUES:  Prior radiation? No  Pacemaker/ICD? No  Possible current pregnancy? No  Is the patient on methotrexate? No  Current Complaints / other details:  Pt anxious about diagnosis and possible treatment options. Pt is accompanied by husband and daughter. Pt reports throbbing pain, 1/10, LEFT breast. Pt scored 5/10 on initial distress screening. SW consult placed per protocol.    Loma Sousa, RN 04/09/2018,2:57 PM

## 2018-03-30 DIAGNOSIS — R102 Pelvic and perineal pain: Secondary | ICD-10-CM | POA: Diagnosis not present

## 2018-03-30 DIAGNOSIS — R1032 Left lower quadrant pain: Secondary | ICD-10-CM | POA: Diagnosis not present

## 2018-03-31 ENCOUNTER — Other Ambulatory Visit: Payer: Self-pay

## 2018-03-31 DIAGNOSIS — D500 Iron deficiency anemia secondary to blood loss (chronic): Secondary | ICD-10-CM

## 2018-03-31 DIAGNOSIS — D509 Iron deficiency anemia, unspecified: Secondary | ICD-10-CM

## 2018-03-31 NOTE — Progress Notes (Signed)
Per Dr.Gudena review of pt recent labs and drop in Hg. Obtain Iron studies and if Ferretin level less than 10, pt will need to have iron transfusion. Notified pt of lab appt and possible transfusion in the future. Pt verbalized understanding and has no further needs at this time.

## 2018-04-01 ENCOUNTER — Other Ambulatory Visit: Payer: Self-pay

## 2018-04-01 ENCOUNTER — Inpatient Hospital Stay: Payer: 59

## 2018-04-01 DIAGNOSIS — D509 Iron deficiency anemia, unspecified: Secondary | ICD-10-CM

## 2018-04-01 DIAGNOSIS — Z170 Estrogen receptor positive status [ER+]: Secondary | ICD-10-CM | POA: Diagnosis not present

## 2018-04-01 DIAGNOSIS — D508 Other iron deficiency anemias: Secondary | ICD-10-CM | POA: Diagnosis not present

## 2018-04-01 DIAGNOSIS — Z803 Family history of malignant neoplasm of breast: Secondary | ICD-10-CM | POA: Diagnosis not present

## 2018-04-01 DIAGNOSIS — C50412 Malignant neoplasm of upper-outer quadrant of left female breast: Secondary | ICD-10-CM | POA: Diagnosis not present

## 2018-04-01 LAB — IRON AND TIBC
IRON: 21 ug/dL — AB (ref 41–142)
Saturation Ratios: 4 % — ABNORMAL LOW (ref 21–57)
TIBC: 463 ug/dL — ABNORMAL HIGH (ref 236–444)
UIBC: 442 ug/dL

## 2018-04-01 LAB — FERRITIN

## 2018-04-01 NOTE — Progress Notes (Signed)
Pt FE 4. Per Dr.Gudena, pt will need iron transfusion if ferretin less than 10. Due to MD out of office. Obtained order for iron infusion from Dr.Magrinat. Pt is aware. Scheduling message sent to start Friday 7/19 and 7/26.

## 2018-04-02 ENCOUNTER — Telehealth: Payer: Self-pay | Admitting: Hematology and Oncology

## 2018-04-02 ENCOUNTER — Other Ambulatory Visit: Payer: Self-pay | Admitting: General Surgery

## 2018-04-02 DIAGNOSIS — C50412 Malignant neoplasm of upper-outer quadrant of left female breast: Secondary | ICD-10-CM | POA: Diagnosis not present

## 2018-04-02 DIAGNOSIS — Z170 Estrogen receptor positive status [ER+]: Principal | ICD-10-CM

## 2018-04-02 DIAGNOSIS — C50411 Malignant neoplasm of upper-outer quadrant of right female breast: Secondary | ICD-10-CM

## 2018-04-02 NOTE — Telephone Encounter (Signed)
Scheduled appt per 7/17 sch msg - spoke w/ pt and confirmed appts.

## 2018-04-03 ENCOUNTER — Inpatient Hospital Stay: Payer: 59

## 2018-04-03 VITALS — BP 106/44 | HR 80 | Temp 98.3°F | Resp 16

## 2018-04-03 DIAGNOSIS — C50412 Malignant neoplasm of upper-outer quadrant of left female breast: Secondary | ICD-10-CM | POA: Diagnosis not present

## 2018-04-03 DIAGNOSIS — D508 Other iron deficiency anemias: Secondary | ICD-10-CM

## 2018-04-03 DIAGNOSIS — Z170 Estrogen receptor positive status [ER+]: Secondary | ICD-10-CM | POA: Diagnosis not present

## 2018-04-03 DIAGNOSIS — Z803 Family history of malignant neoplasm of breast: Secondary | ICD-10-CM | POA: Diagnosis not present

## 2018-04-03 MED ORDER — SODIUM CHLORIDE 0.9 % IV SOLN
510.0000 mg | Freq: Once | INTRAVENOUS | Status: AC
  Administered 2018-04-03: 510 mg via INTRAVENOUS
  Filled 2018-04-03: qty 17

## 2018-04-03 MED ORDER — SODIUM CHLORIDE 0.9 % IV SOLN
Freq: Once | INTRAVENOUS | Status: AC
  Administered 2018-04-03: 08:00:00 via INTRAVENOUS

## 2018-04-03 NOTE — Patient Instructions (Signed)

## 2018-04-06 NOTE — Progress Notes (Signed)
FMLA successfully faxed to [employer] at [phone]. Copy taken to Ms. Wilma at front desk for pick up per patient request.

## 2018-04-08 ENCOUNTER — Telehealth: Payer: Self-pay | Admitting: Genetics

## 2018-04-08 ENCOUNTER — Ambulatory Visit: Payer: 59

## 2018-04-08 ENCOUNTER — Ambulatory Visit
Admission: RE | Admit: 2018-04-08 | Discharge: 2018-04-08 | Disposition: A | Discharge status: Home / Self Care | Payer: 59 | Source: Ambulatory Visit | Attending: General Surgery | Admitting: General Surgery

## 2018-04-08 ENCOUNTER — Ambulatory Visit: Payer: 59 | Admitting: Radiation Oncology

## 2018-04-08 DIAGNOSIS — C50411 Malignant neoplasm of upper-outer quadrant of right female breast: Secondary | ICD-10-CM

## 2018-04-08 DIAGNOSIS — N6321 Unspecified lump in the left breast, upper outer quadrant: Secondary | ICD-10-CM | POA: Diagnosis not present

## 2018-04-08 DIAGNOSIS — Z170 Estrogen receptor positive status [ER+]: Principal | ICD-10-CM

## 2018-04-08 MED ORDER — GADOBENATE DIMEGLUMINE 529 MG/ML IV SOLN
13.0000 mL | Freq: Once | INTRAVENOUS | Status: AC | PRN
  Administered 2018-04-08: 13 mL via INTRAVENOUS

## 2018-04-08 NOTE — Telephone Encounter (Signed)
Revealed negative genetic test results.  We discuss this lab is calling the an NF1 variant uncertain, however, other labs are calling it likely pathogenic.  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  Individuals with NF1 mutations/neurofibromatosis are at somewhat increased risk for breast cancer- this may explain to some degree why she developed her breast cancer.   The laboratory is willing to do family testing to try to reclassify this variant, however, given she has a clinical diagnosis of NF1 already it is not likely to change any management to reclassify this variant for her.   There were not other mutations identified in any other genes analyzed.  We did recommend her mother have genetic testing due to her personal history of breast cancer at a young age.

## 2018-04-09 ENCOUNTER — Ambulatory Visit
Admission: RE | Admit: 2018-04-09 | Discharge: 2018-04-09 | Disposition: A | Discharge status: Home / Self Care | Payer: 59 | Source: Ambulatory Visit | Attending: Radiation Oncology | Admitting: Radiation Oncology

## 2018-04-09 ENCOUNTER — Other Ambulatory Visit: Payer: Self-pay

## 2018-04-09 ENCOUNTER — Encounter: Payer: Self-pay | Admitting: Radiation Oncology

## 2018-04-09 ENCOUNTER — Other Ambulatory Visit: Payer: Self-pay | Admitting: General Surgery

## 2018-04-09 VITALS — BP 115/60 | HR 79 | Temp 98.0°F | Resp 20 | Wt 142.0 lb

## 2018-04-09 DIAGNOSIS — N6310 Unspecified lump in the right breast, unspecified quadrant: Secondary | ICD-10-CM

## 2018-04-09 DIAGNOSIS — M797 Fibromyalgia: Secondary | ICD-10-CM | POA: Diagnosis not present

## 2018-04-09 DIAGNOSIS — F329 Major depressive disorder, single episode, unspecified: Secondary | ICD-10-CM | POA: Insufficient documentation

## 2018-04-09 DIAGNOSIS — C50412 Malignant neoplasm of upper-outer quadrant of left female breast: Secondary | ICD-10-CM | POA: Diagnosis not present

## 2018-04-09 DIAGNOSIS — Z170 Estrogen receptor positive status [ER+]: Secondary | ICD-10-CM | POA: Insufficient documentation

## 2018-04-09 DIAGNOSIS — Z79899 Other long term (current) drug therapy: Secondary | ICD-10-CM | POA: Diagnosis not present

## 2018-04-09 DIAGNOSIS — N6313 Unspecified lump in the right breast, lower outer quadrant: Secondary | ICD-10-CM | POA: Diagnosis not present

## 2018-04-09 DIAGNOSIS — Z818 Family history of other mental and behavioral disorders: Secondary | ICD-10-CM | POA: Diagnosis not present

## 2018-04-09 DIAGNOSIS — Z823 Family history of stroke: Secondary | ICD-10-CM | POA: Insufficient documentation

## 2018-04-09 DIAGNOSIS — F419 Anxiety disorder, unspecified: Secondary | ICD-10-CM | POA: Diagnosis not present

## 2018-04-09 DIAGNOSIS — Z833 Family history of diabetes mellitus: Secondary | ICD-10-CM | POA: Diagnosis not present

## 2018-04-09 DIAGNOSIS — Z9049 Acquired absence of other specified parts of digestive tract: Secondary | ICD-10-CM | POA: Insufficient documentation

## 2018-04-09 DIAGNOSIS — Z8249 Family history of ischemic heart disease and other diseases of the circulatory system: Secondary | ICD-10-CM | POA: Insufficient documentation

## 2018-04-09 DIAGNOSIS — Z803 Family history of malignant neoplasm of breast: Secondary | ICD-10-CM | POA: Diagnosis not present

## 2018-04-09 NOTE — Progress Notes (Signed)
Radiation Oncology         [phone] ________________________________  Initial Outpatient Consultation  Name: ANNE BOLTZ MRN: [id number]  Date: 04/09/2018  DOB: [birth year]  IO:NGEXB, Roosevelt Locks, PA-C  Nicholas Lose, MD   REFERRING PHYSICIAN: Nicholas Lose, MD  DIAGNOSIS: The encounter diagnosis was Malignant neoplasm of upper-outer quadrant of left breast in female, estrogen receptor positive ([place]).   Clinical Stage I (T1cN0) Left Breast Invasive Ductal Carcinoma DCIS UOQ ER+/ PR+/ HER2 negative, grade I-II  HISTORY OF PRESENT ILLNESS::Anne Horton is a 42 y.o. female who is accompanied by her husband and daughter. She was required to get a physical for work, which led to her getting a mammogram. On 03/10/2018 she underwent a bilateral digital mammogram with CAD and TOMO, breast density category C, suggesting further evaluation.   Diagnostic left Mammogram with TOMO and left breast ultrasound on 03/16/2018 showed suspicious mass over the 2 o'clock position of the left breast 8 cm from the nipple measuring 0.8 x 1.1 x 1.2 cm. No abnormal left axillary lymph nodes.   On 03/20/2018 she underwent a left breast needle core biopsy a 2 o'clock showing invasive ductal carcinoma, grade 1-2 and ductal carcinoma IN SITU, high nuclear grade. Estrogen Receptor: 95%, positive, strong staining intensity; Progesterone Receptor: 95%, positive, strong staining intensity; Proliferation Marker Ki67: 1%; HER2: negative, with a ratio of 1.47.   She has a strong family history of breast cancer which led her to undergo genetic testing, which resulted negative.  She denies any symptoms.   PREVIOUS RADIATION THERAPY: No  PAST MEDICAL HISTORY:  has a past medical history of Allergy, Anemia, Anxiety, Chest pain, Depression, Family history of breast cancer, Fibromyalgia, H/O: depression, migraines, Mitral regurgitation, Neurofibromatosis ([place]), Palpitations, Tachycardia, and Wears contact lenses.     PAST SURGICAL HISTORY: Past Surgical History:  Procedure Laterality Date  . CESAREAN SECTION    . CHOLECYSTECTOMY N/A 01/04/2015   Procedure: LAPAROSCOPIC CHOLECYSTECTOMY WITH INTRAOPERATIVE CHOLANGIOGRAM;  Surgeon: Erroll Luna, MD;  Location: [place] [facility];  Service: General;  Laterality: N/A;  . COLONOSCOPY    . ESOPHAGOGASTRODUODENOSCOPY    . WISDOM TOOTH EXTRACTION      FAMILY HISTORY: family history includes Atrial fibrillation in her maternal grandfather; Breast cancer (age of onset: 82) in her mother; Cancer in her paternal grandmother; Diabetes in her mother and paternal uncle; Heart disease in her paternal uncle; Neurofibromatosis in her father and paternal grandmother; Stroke in her paternal grandfather.  SOCIAL HISTORY:  reports that she has never smoked. She has never used smokeless tobacco. She reports that she drinks alcohol. She reports that she does not use drugs.  ALLERGIES: Latex  MEDICATIONS:  Current Outpatient Medications  Medication Sig Dispense Refill  . ALPRAZolam (XANAX) 0.5 MG tablet Take 0.5 mg by mouth at bedtime as needed (FOR ANXIETY). :take 1/2 to 1 twice a day as needed    . cetirizine (ZYRTEC) 10 MG tablet Take 10 mg by mouth as needed for allergies.     . DULoxetine (CYMBALTA) 60 MG capsule Take 60 mg by mouth daily.     Marland Kitchen ibuprofen (ADVIL,MOTRIN) 200 MG tablet Take 800 mg by mouth daily as needed for headache.    . metoprolol succinate (TOPROL-XL) 50 MG 24 hr tablet TAKE 1 TABLET BY MOUTH DAILY. TAKE WITH OR IMMEDIATELY FOLLOWING A MEAL. 90 tablet 3  . propranolol (INDERAL) 10 MG tablet TAKE 1 TABLET BY MOUTH FOUR TIMES DAILY AS NEEDED FOR PALPITATIONS 120 tablet 3  No current facility-administered medications for this encounter.     REVIEW OF SYSTEMS:   REVIEW OF SYSTEMS: A 10+ POINT REVIEW OF SYSTEMS WAS OBTAINED including neurology, dermatology, psychiatry, cardiac, respiratory, lymph, extremities, GI, GU, musculoskeletal,  constitutional, reproductive, HEENT. All pertinent positives are noted in the HPI. All others are negative.   PHYSICAL EXAM:  weight is 142 lb (64.4 kg). Her oral temperature is 98 F (36.7 C). Her blood pressure is 115/60 and her pulse is 79. Her respiration is 20 and oxygen saturation is 100%.    General: Alert and oriented, in no acute distress HEENT: Head is normocephalic. Extraocular movements are intact. Oropharynx is clear. Neck: Neck is supple, no palpable cervical or supraclavicular lymphadenopathy. Heart: Regular in rate and rhythm with no murmurs, rubs, or gallops. Chest: Clear to auscultation bilaterally, with no rhonchi, wheezes, or rales. Abdomen: Soft, nontender, nondistended, with no rigidity or guarding. Extremities: No cyanosis or edema. Lymphatics: see Neck Exam Skin: No concerning lesions. Musculoskeletal: symmetric strength and muscle tone throughout. Neurologic: Cranial nerves II through XII are grossly intact. No obvious focalities. Speech is fluent. Coordination is intact. Psychiatric: Judgment and insight are intact. Affect is appropriate. BREASTS: L Breast: large pendulous breast.  Biopsy site lateral aspect. No palpable masses, nipple discharge or bleeding. R breast: large pendulous breast. No palpable masses, nipple discharge, or bleeding  ECOG = 0    LABORATORY DATA:  Lab Results  Component Value Date   WBC 7.8 03/24/2018   HGB 10.1 (L) 03/24/2018   HCT 32.2 (L) 03/24/2018   MCV 77.8 (L) 03/24/2018   PLT 424 (H) 03/24/2018   NEUTROABS 4.1 03/24/2018   Lab Results  Component Value Date   NA 138 03/24/2018   K 4.7 03/24/2018   CL 102 03/24/2018   CO2 30 03/24/2018   GLUCOSE 87 03/24/2018   CREATININE 0.74 03/24/2018   CALCIUM 9.4 03/24/2018      RADIOGRAPHY: Mr Breast Bilateral W Wo Contrast Inc Cad  Result Date: 04/08/2018 CLINICAL DATA:  42 year old female with recently diagnosed left breast grade 1-2 invasive ductal carcinoma and ductal  carcinoma in situ post ultrasound-guided biopsy of a 1.2 cm mass at the 2 o'clock position. The patient has a very strong family history of breast cancer including in her mother diagnosed at age 54 as well as her grandmother. LABS:  Not applicable. EXAM: BILATERAL BREAST MRI WITH AND WITHOUT CONTRAST TECHNIQUE: Multiplanar, multisequence MR images of both breasts were obtained prior to and following the intravenous administration of 13 ml of MultiHance. THREE-DIMENSIONAL MR IMAGE RENDERING ON INDEPENDENT WORKSTATION: Three-dimensional MR images were rendered by post-processing of the original MR data on an independent workstation. The three-dimensional MR images were interpreted, and findings are reported in the following complete MRI report for this study. Three dimensional images were evaluated at the independent DynaCad workstation COMPARISON:  Previous exam(s). FINDINGS: Breast composition: c.  Heterogeneous fibroglandular tissue. Background parenchymal enhancement: Marked. Right breast: There is 2.3 cm linear oriented non mass enhancement in the lower outer posterior right breast (subtraction image 128). Left breast: Irregular enhancing mass in the upper-outer left breast containing biopsy marking clip artifact compatible with biopsy proven malignancy measures 1.1 cm AP 1.2 cm transverse and 1.3 cm craniocaudal. No definite additional abnormal areas of enhancement identified in the left breast to suggest additional sites of disease. Lymph nodes: No morphologically abnormal lymph nodes. Small less than 3 mm left internal mammary lymph node subtraction image 67) seen. Ancillary findings:  None. IMPRESSION:  1. Biopsy proven malignancy in the upper-outer posterior left breast measures 1.1 x 1.2 x 1.3 cm. 2. Suspicious linear oriented non mass enhancement in the lower outer quadrant of the right breast. 3. Marked background enhancement limits evaluation of the breast parenchyma. RECOMMENDATION: Recommend MRI guided  biopsy of the linear oriented non mass enhancement in the lower outer posterior right breast. BI-RADS CATEGORY  4: Suspicious. Electronically Signed   By: Everlean Alstrom M.D.   On: 04/08/2018 12:47   Mm Clip Placement Left  Result Date: 03/20/2018 CLINICAL DATA:  42 year old female status post ultrasound-guided biopsy of the left breast. EXAM: DIAGNOSTIC LEFT MAMMOGRAM POST ULTRASOUND BIOPSY COMPARISON:  Previous exam(s). FINDINGS: Mammographic images were obtained following ultrasound guided biopsy of left breast. A ribbon shaped clip is identified in the upper outer quadrant at posterior depth. This is in association with the mammographically identified distortion. IMPRESSION: Ribbon shaped clip in the expected location status post ultrasound-guided biopsy of the left breast. Final Assessment: Post Procedure Mammograms for Marker Placement Electronically Signed   By: Kristopher Oppenheim M.D.   On: 03/20/2018 13:46   [place] Lt Breast Bx W Loc Dev 1st Lesion Img Bx Spec US Guide  Addendum Date: 03/24/2018   ADDENDUM REPORT: 03/23/2018 16:00 ADDENDUM: Pathology revealed GRADE I-II INVASIVE DUCTAL CARCINOMA, DUCTAL CARCINOMA IN SITU (DCIS), HIGH NUCLEAR GRADE of LEFT breast, 2 o'clock. This was found to be concordant by Dr. Kristopher Oppenheim. Pathology results were discussed with the patient by Deland Pretty, Breast Navigator, [facility], [place], [place]. The patient reported doing well after the biopsy with tenderness at the site. Post biopsy instructions and care were reviewed and questions were answered. Surgical consultation will be arranged by Deland Pretty, with Dr. Rolm Bookbinder, per patient request at [facility] on April 09, 2018. Pathology and imaging reports will be faxed to Deland Pretty. Additional recommendation for contrast enhanced breast MRI given the patient's breast density to evaluate extent of disease. Pathology results reported by Roselind Messier, RN on 03/23/2018.  Electronically Signed   By: Kristopher Oppenheim M.D.   On: 03/23/2018 16:00   Result Date: 03/24/2018 CLINICAL DATA:  42 year old female with a suspicious left breast mass. EXAM: ULTRASOUND GUIDED LEFT BREAST CORE NEEDLE BIOPSY COMPARISON:  Previous exam(s). FINDINGS: I met with the patient and we discussed the procedure of ultrasound-guided biopsy, including benefits and alternatives. We discussed the high likelihood of a successful procedure. We discussed the risks of the procedure, including infection, bleeding, tissue injury, clip migration, and inadequate sampling. Informed written consent was given. The usual time-out protocol was performed immediately prior to the procedure. Lesion quadrant: Upper-outer quadrant Using sterile technique and 1% Lidocaine as local anesthetic, under direct ultrasound visualization, a 12 gauge spring-loaded device was used to perform biopsy of a suspicious mass using a inferior approach. At the conclusion of the procedure a ribbon shaped tissue marker clip was deployed into the biopsy cavity. Follow up 2 view mammogram was performed and dictated separately. IMPRESSION: Ultrasound guided biopsy of the left breast. No apparent complications. Electronically Signed: By: Kristopher Oppenheim M.D. On: 03/20/2018 13:45      IMPRESSION: Clinical Stage I (T1cN0) Left Breast Invasive Ductal Carcinoma DCIS UOQ ER+/ PR+/ HER2 negative, grade I-II  She would be a good candidate for breast conservation with radiotherapy to left breast. We discussed the general course of radiation, potential side effects, and toxicities with radiation and the patient is interested in this approach.   MRI from yesterday  showed the biopsy-proven malignancy in the upper outer quadrant of the left breast measured 1.1 x 1.2 x 1.3 cm. Within the right breast patient was noted to have suspicious non-mass enhancement linear oriented  in the lower outer quadrant for which MR guided biopsy was recommended. No suspicious  lymphadenopathy noted on the patient's MRI.  PLAN:  the patient will proceed with MR guided biopsy of the right breast in the near future. She will then meet with Dr. Donne Hazel to plan her upcoming surgery. The patient will be seen in the postoperative setting for further evaluation as it relates to radiation therapy in breast conserving treatment.    ------------------------------------------------  Blair Promise, PhD, MD  This document serves as a record of services personally performed by Blair Promise, PhD, MD. It was created on his behalf by Margit Banda, a trained medical scribe. The creation of this record is based on the scribe's personal observations and the provider's statements to them. This document has been checked and approved by the attending provider.

## 2018-04-10 ENCOUNTER — Encounter: Payer: Self-pay | Admitting: General Practice

## 2018-04-10 ENCOUNTER — Inpatient Hospital Stay: Payer: 59

## 2018-04-10 VITALS — BP 112/48 | HR 76 | Temp 98.2°F | Resp 17

## 2018-04-10 DIAGNOSIS — Z170 Estrogen receptor positive status [ER+]: Secondary | ICD-10-CM | POA: Diagnosis not present

## 2018-04-10 DIAGNOSIS — D508 Other iron deficiency anemias: Secondary | ICD-10-CM

## 2018-04-10 DIAGNOSIS — C50412 Malignant neoplasm of upper-outer quadrant of left female breast: Secondary | ICD-10-CM | POA: Diagnosis not present

## 2018-04-10 DIAGNOSIS — Z803 Family history of malignant neoplasm of breast: Secondary | ICD-10-CM | POA: Diagnosis not present

## 2018-04-10 MED ORDER — SODIUM CHLORIDE 0.9 % IV SOLN
510.0000 mg | Freq: Once | INTRAVENOUS | Status: AC
  Administered 2018-04-10: 510 mg via INTRAVENOUS
  Filled 2018-04-10: qty 17

## 2018-04-10 MED ORDER — SODIUM CHLORIDE 0.9 % IV SOLN
Freq: Once | INTRAVENOUS | Status: AC
  Administered 2018-04-10: 08:00:00 via INTRAVENOUS
  Filled 2018-04-10: qty 250

## 2018-04-10 NOTE — Patient Instructions (Signed)

## 2018-04-10 NOTE — Progress Notes (Signed)
[facility] Psychosocial Distress Screening Clinical Social Work  Clinical Social Work was referred by distress screening protocol.  The patient scored a 5 on the Psychosocial Distress Thermometer which indicates moderate distress. Clinical Social Worker contacted patient by phone to assess for distress and other psychosocial needs. Patient at work, unable to talk at this time.  CSW offered to mail information packet on [employer] resources, patient agreeable.  Encouraged to call as needed.    ON[facility]N DISTRESS SCREENING 04/09/2018  Screening Type Initial Screening  Distress experienced in past week (1-10) 5  Emotional problem type Depression;Nervousness/Anxiety;Adjusting to illness  Physical Problem type Pain;Sleep/insomnia    Clinical Social Worker follow up needed: No.  If yes, follow up plan:  Beverely Pace, [place], LCSW Clinical Social Worker Phone:  [phone]

## 2018-04-13 ENCOUNTER — Telehealth: Payer: Self-pay | Admitting: Hematology and Oncology

## 2018-04-13 NOTE — Telephone Encounter (Signed)
I called the patient and left a voicemail that her iron studies were abnormal. I instructed her to call me back so that we may discuss giving intravenous iron therapy.

## 2018-04-16 ENCOUNTER — Ambulatory Visit: Payer: Self-pay | Admitting: Genetics

## 2018-04-16 DIAGNOSIS — Z803 Family history of malignant neoplasm of breast: Secondary | ICD-10-CM

## 2018-04-16 DIAGNOSIS — Z853 Personal history of malignant neoplasm of breast: Secondary | ICD-10-CM

## 2018-04-16 DIAGNOSIS — C50919 Malignant neoplasm of unspecified site of unspecified female breast: Secondary | ICD-10-CM

## 2018-04-16 DIAGNOSIS — Z1379 Encounter for other screening for genetic and chromosomal anomalies: Secondary | ICD-10-CM

## 2018-04-16 DIAGNOSIS — Q8500 Neurofibromatosis, unspecified: Secondary | ICD-10-CM

## 2018-04-16 DIAGNOSIS — Z170 Estrogen receptor positive status [ER+]: Secondary | ICD-10-CM

## 2018-04-16 DIAGNOSIS — C50412 Malignant neoplasm of upper-outer quadrant of left female breast: Secondary | ICD-10-CM

## 2018-04-16 NOTE — Progress Notes (Addendum)
HPI:  [name] was previously seen in the [facility] on 03/24/2018 due to a personal and family history of breast cancer and concerns regarding a hereditary predisposition to cancer. Please refer to our prior cancer genetics clinic note for more information regarding [name]'s medical, social and family histories, and our assessment and recommendations, at the time. [name] recent genetic test results were disclosed to her, as well as recommendations warranted by these results. These results and recommendations are discussed in more detail below.  CANCER HISTORY:    Malignant neoplasm of upper-outer quadrant of left breast in female, estrogen receptor positive ([place])   03/20/2018 Initial Diagnosis    Screening detected left breast spiculated mass by ultrasound measured 1.2 cm.  Biopsy revealed invasive ductal carcinoma grade 1-2 with high-grade DCIS, ER 95%, PR 95%, Ki-67 1%, HER-2 negative ratio 1.47, T1CN0 stage I a clinical stage      On 03/20/2018, at the age of 42, [name] was diagnosed with Invasive ductal carcinoma with DCIS of the left breast. She is currently considering treatment options at this time.  [name] has a known diagnosis of Neurofibromatosis dx several years ago.     FAMILY HISTORY:  We obtained a detailed, 4-generation family history.  Significant diagnoses are listed below: Family History  Problem Relation Age of Onset  . Breast cancer Mother 76       estrogen negative, recurred in 2012  . Diabetes Mother   . Neurofibromatosis Father   . Atrial fibrillation Maternal Grandfather   . Cancer Paternal Grandmother        type unk, died at 78  . Neurofibromatosis Paternal Grandmother   . Stroke Paternal Grandfather   . Heart disease Paternal Uncle   . Diabetes Paternal Uncle   . Heart attack Neg Hx   . Hypertension Neg Hx    [name] has a 92 year-old daughter.  [name] is an only child.   [name]'s father: 66, has dx of NF1, no  history of cancer.  Paternal aunts/Uncles: 1 paternal uncle died due to heart disease/ diabetes. Paternal cousins: 2 paternal cousins, 1 is deceased, no history of cancer.  Paternal grandfather: deceased due to stroke Paternal grandmother:died at 78 die to metastatic cancer, unk primary.  She also had a hx of NF1  [name]'s mother: dx with breast cancer at 68.  In 2012 breast cancer came back in same breast and same location.  Her breast cancer was estrogen negative.  She is unsure if she ever had genetic testing.  She also had tongue cancer and a hysterectomy at the age of 19.  Maternal Aunts/Uncles: 2 maternal aunts and 1 maternal uncle in their 27's/60's with no history of cancer.  Maternal cousins: no history of cancer.  Maternal grandfather: died >50 due to infection.  He had a brother who died of cancer.  Maternal grandmother:died at 40 due to a motor vehicle accident.  No reported hx of cancer on her side of the family .  Patient's maternal ancestors are of Scottish/Irish/Native American descent, and paternal ancestors are of German/Polish descent. There is no reported Ashkenazi Jewish ancestry. There is no known consanguinity.   GENETIC TEST RESULTS: Genetic testing performed through [facility]'s Common Hereditary Cancers Panel did not identify and pathogenic mutations.  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   Please note:  Other laboratories are calling this variant likely pathogenic- it is  likely the causative mutation for her known clinical diagnosis of Neurofibromatosis.  This specific laboratory simply does not have enough data to feel completely confident enough to definitively classify it.  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  She has been seen in the past by [facility] for this condition. We recommended she follow all recommendations and follow-up with her providers  there.   No additional variants were identified in any of the other genes analysed: The Common Hereditary Cancer Panel offered by [facility] includes sequencing and/or deletion duplication testing of the following 47 genes: APC, ATM, AXIN2, BARD1, BMPR1A, BRCA1, BRCA2, BRIP1, CDH1, CDKN2A (p14ARF), CDKN2A (p16INK4a), CKD4, CHEK2, CTNNA1, DICER1, EPCAM (Deletion/duplication testing only), GREM1 (promoter region deletion/duplication testing only), KIT, MEN1, MLH1, MSH2, MSH3, MSH6, MUTYH, NBN, NF1, NHTL1, PALB2, PDGFRA, PMS2, POLD1, POLE, PTEN, RAD50, RAD51C, RAD51D, SDHB, SDHC, SDHD, SMAD4, SMARCA4. STK11, TP53, TSC1, TSC2, and VHL.  The following genes were evaluated for sequence changes only: SDHA and HOXB13 c.251G>A variant only.  The test report will be scanned into EPIC and will be located under the Molecular Pathology section of the Results Review tab. A portion of the result report is included below for reference.    ADDITIONAL GENETIC TESTING: We discussed with [name] that there are other genes that are associated with increased cancer risk that can be analyzed. The laboratories that offer this testing look at these additional genes via a hereditary cancer gene panel. Should [name] wish to pursue additional genetic testing, we are happy to discuss and coordinate this testing, at any time.    NF1 and Caner risk:  Neurofibromatosis 1 (NF1) is a condition with several cutaneous, neurological, skeletal symptoms.  Individuals with this disease should be managed by a provider with an in depth understanding of this disease to manage all of the multisystemic aspects.    However, from an oncology standpoint, these are some of the potential risks/manifestations of the disease:   Breast Cancer: -Individuals with NF1 have an increased risk to develop breast cancer.  This risk is estimated to be incrased 5 fold under the age of 66 and 3.5 fold increased over a woman's lifetime.    Screening:  Annual  mammogram with consideration of tomosynthesis starting at age 9 and consideration of breast MRI with contrast from ages 43-50.    Note: Family history of breast cancer does also impact risk:  [name] has a history significant for a mother with breast cancer at 40.   Other risks:  -about 15% of individuals with NF1 develop brain  tumors (gliomas)/ often optic gliomas.  These tend to develop during childhood.  -Increased risk for gastrointestinal stromal tumors (GIST) -The neurofibromas seen in NF1 are not cancerous.  However, tumors may occasionally transform into malignant peripheral nerve sheath tumors.  -Individuals with NF1 are at an elevated risk to develop leukemia, particularly childhood leukemia  Management for these risks and other symptoms of NF1 should occur by a genetics specialist/ NF1 specialist.   CANCER SCREENING RECOMMENDATIONS: [name] test result is considered negative (normal).  However, as discussed above she has a clinical diagnosis of NF1.  This diagnosis is likely at least part of the cause for her breast cancer.    No additional genetic cause for her or her family's cancer was identified.    While reassuring, genetic testing is not perfect.  It is still possible that there could be genetic mutations that  are undetectable by current technology, or genetic mutations in genes that have not been tested or identified to increase cancer risk.  Therefore, it is recommended she continue to follow the cancer management and screening guidelines provided by her oncology and primary healthcare provider. An individual's cancer risk is not determined by genetic test results alone.  Overall cancer risk assessment includes additional factors such as personal medical history, family history, etc.  These should be used to make a personalized plan for cancer prevention and surveillance.    RECOMMENDATIONS FOR FAMILY MEMBERS:  Relatives in this family might be at some increased risk of  developing cancer, over the general population risk, simply due to the family history of cancer.  We recommended women in this family have a yearly mammogram beginning at age 48, or 60 years younger than the earliest onset of cancer, an annual clinical breast exam, and perform monthly breast self-exams. Women in this family should also have a gynecological exam as recommended by their primary provider. All family members should have a colonoscopy by age 55 (or as directed by their doctors).  All family members should inform their physicians about the family history of cancer so their doctors can make the most appropriate screening recommendations for them.   It is also possible there is a hereditary cause for the cancer in [name]'s family that she did not inherit and therefore was not identified in her.  Therefore, we recommended mother who was dx with breast cancer at 82 also have genetic counseling and testing. [name] will let us know if we can be of any assistance in coordinating genetic counseling and/or testing for these family members.   FOLLOW-UP: Lastly, we discussed with [name] that cancer genetics is a rapidly advancing field and it is possible that new genetic tests will be appropriate for her and/or her family members in the future. We encouraged her to remain in contact with cancer genetics on an annual basis so we can update her personal and family histories and let her know of advances in cancer genetics that may benefit this family.   Our contact number was provided. [name]'s questions were answered to her satisfaction, and she knows she is welcome to call us at anytime with additional questions or concerns.   Ferol Luz, MS, [facility] Certified Genetic Counselor [email or web address]

## 2018-04-17 ENCOUNTER — Ambulatory Visit
Admission: RE | Admit: 2018-04-17 | Discharge: 2018-04-17 | Disposition: A | Discharge status: Home / Self Care | Payer: 59 | Source: Ambulatory Visit | Attending: General Surgery | Admitting: General Surgery

## 2018-04-17 DIAGNOSIS — N6310 Unspecified lump in the right breast, unspecified quadrant: Secondary | ICD-10-CM

## 2018-04-17 DIAGNOSIS — N6489 Other specified disorders of breast: Secondary | ICD-10-CM | POA: Diagnosis not present

## 2018-04-17 DIAGNOSIS — N62 Hypertrophy of breast: Secondary | ICD-10-CM | POA: Diagnosis not present

## 2018-04-17 MED ORDER — GADOBENATE DIMEGLUMINE 529 MG/ML IV SOLN
13.0000 mL | Freq: Once | INTRAVENOUS | Status: AC | PRN
  Administered 2018-04-17: 13 mL via INTRAVENOUS

## 2018-04-20 ENCOUNTER — Other Ambulatory Visit: Payer: Self-pay | Admitting: General Surgery

## 2018-04-20 DIAGNOSIS — C50412 Malignant neoplasm of upper-outer quadrant of left female breast: Secondary | ICD-10-CM

## 2018-04-20 DIAGNOSIS — Z170 Estrogen receptor positive status [ER+]: Principal | ICD-10-CM

## 2018-04-22 ENCOUNTER — Other Ambulatory Visit: Payer: Self-pay | Admitting: General Surgery

## 2018-04-22 DIAGNOSIS — Z170 Estrogen receptor positive status [ER+]: Principal | ICD-10-CM

## 2018-04-22 DIAGNOSIS — C50412 Malignant neoplasm of upper-outer quadrant of left female breast: Secondary | ICD-10-CM

## 2018-04-30 ENCOUNTER — Encounter (HOSPITAL_BASED_OUTPATIENT_CLINIC_OR_DEPARTMENT_OTHER): Payer: Self-pay | Admitting: *Deleted

## 2018-04-30 ENCOUNTER — Other Ambulatory Visit: Payer: Self-pay

## 2018-04-30 NOTE — Progress Notes (Signed)
Reviewed chart with Dr. Aris Lot regarding pts history of low hgb and recent iron infusion. No need to redraw CBC prior to surgery.

## 2018-05-06 ENCOUNTER — Ambulatory Visit
Admission: RE | Admit: 2018-05-06 | Discharge: 2018-05-06 | Disposition: A | Discharge status: Home / Self Care | Payer: 59 | Source: Ambulatory Visit | Attending: General Surgery | Admitting: General Surgery

## 2018-05-06 DIAGNOSIS — D0512 Intraductal carcinoma in situ of left breast: Secondary | ICD-10-CM | POA: Diagnosis not present

## 2018-05-06 DIAGNOSIS — C50912 Malignant neoplasm of unspecified site of left female breast: Secondary | ICD-10-CM | POA: Diagnosis not present

## 2018-05-06 DIAGNOSIS — C50412 Malignant neoplasm of upper-outer quadrant of left female breast: Secondary | ICD-10-CM

## 2018-05-06 DIAGNOSIS — Z170 Estrogen receptor positive status [ER+]: Principal | ICD-10-CM

## 2018-05-06 NOTE — Progress Notes (Signed)
Ensure drink was given to patient with instructions to complete by 9:15 am.  Hibiclens given as well as instructions.  Patient verbalized understanding for Ensure and Hibiclens.

## 2018-05-07 ENCOUNTER — Ambulatory Visit (HOSPITAL_COMMUNITY)
Admission: RE | Admit: 2018-05-07 | Discharge: 2018-05-07 | Disposition: A | Discharge status: Home / Self Care | Payer: 59 | Source: Ambulatory Visit | Attending: General Surgery | Admitting: General Surgery

## 2018-05-07 ENCOUNTER — Encounter (HOSPITAL_BASED_OUTPATIENT_CLINIC_OR_DEPARTMENT_OTHER)
Admission: RE | Disposition: A | Discharge status: Home / Self Care | Payer: Self-pay | Source: Ambulatory Visit | Attending: General Surgery

## 2018-05-07 ENCOUNTER — Other Ambulatory Visit: Payer: Self-pay

## 2018-05-07 ENCOUNTER — Ambulatory Visit (HOSPITAL_BASED_OUTPATIENT_CLINIC_OR_DEPARTMENT_OTHER)
Admission: RE | Admit: 2018-05-07 | Discharge: 2018-05-07 | Disposition: A | Discharge status: Home / Self Care | Payer: 59 | Source: Ambulatory Visit | Attending: General Surgery | Admitting: General Surgery

## 2018-05-07 ENCOUNTER — Ambulatory Visit (HOSPITAL_BASED_OUTPATIENT_CLINIC_OR_DEPARTMENT_OTHER): Payer: 59 | Admitting: Anesthesiology

## 2018-05-07 ENCOUNTER — Encounter (HOSPITAL_BASED_OUTPATIENT_CLINIC_OR_DEPARTMENT_OTHER): Payer: Self-pay | Admitting: Anesthesiology

## 2018-05-07 ENCOUNTER — Ambulatory Visit
Admission: RE | Admit: 2018-05-07 | Discharge: 2018-05-07 | Disposition: A | Discharge status: Home / Self Care | Payer: 59 | Source: Ambulatory Visit | Attending: General Surgery | Admitting: General Surgery

## 2018-05-07 DIAGNOSIS — C50912 Malignant neoplasm of unspecified site of left female breast: Secondary | ICD-10-CM | POA: Diagnosis not present

## 2018-05-07 DIAGNOSIS — D509 Iron deficiency anemia, unspecified: Secondary | ICD-10-CM | POA: Diagnosis not present

## 2018-05-07 DIAGNOSIS — Z803 Family history of malignant neoplasm of breast: Secondary | ICD-10-CM | POA: Diagnosis not present

## 2018-05-07 DIAGNOSIS — N6012 Diffuse cystic mastopathy of left breast: Secondary | ICD-10-CM | POA: Diagnosis not present

## 2018-05-07 DIAGNOSIS — F418 Other specified anxiety disorders: Secondary | ICD-10-CM | POA: Diagnosis not present

## 2018-05-07 DIAGNOSIS — F419 Anxiety disorder, unspecified: Secondary | ICD-10-CM | POA: Diagnosis not present

## 2018-05-07 DIAGNOSIS — C50412 Malignant neoplasm of upper-outer quadrant of left female breast: Secondary | ICD-10-CM | POA: Diagnosis not present

## 2018-05-07 DIAGNOSIS — M797 Fibromyalgia: Secondary | ICD-10-CM | POA: Diagnosis not present

## 2018-05-07 DIAGNOSIS — Z170 Estrogen receptor positive status [ER+]: Secondary | ICD-10-CM | POA: Insufficient documentation

## 2018-05-07 DIAGNOSIS — N6489 Other specified disorders of breast: Secondary | ICD-10-CM | POA: Insufficient documentation

## 2018-05-07 DIAGNOSIS — F329 Major depressive disorder, single episode, unspecified: Secondary | ICD-10-CM | POA: Diagnosis not present

## 2018-05-07 DIAGNOSIS — Z79899 Other long term (current) drug therapy: Secondary | ICD-10-CM | POA: Diagnosis not present

## 2018-05-07 DIAGNOSIS — C50919 Malignant neoplasm of unspecified site of unspecified female breast: Secondary | ICD-10-CM

## 2018-05-07 DIAGNOSIS — I350 Nonrheumatic aortic (valve) stenosis: Secondary | ICD-10-CM | POA: Diagnosis not present

## 2018-05-07 DIAGNOSIS — R928 Other abnormal and inconclusive findings on diagnostic imaging of breast: Secondary | ICD-10-CM | POA: Diagnosis not present

## 2018-05-07 DIAGNOSIS — Z91040 Latex allergy status: Secondary | ICD-10-CM | POA: Diagnosis not present

## 2018-05-07 HISTORY — PX: BREAST LUMPECTOMY WITH RADIOACTIVE SEED AND SENTINEL LYMPH NODE BIOPSY: SHX6550

## 2018-05-07 HISTORY — PX: BREAST LUMPECTOMY: SHX2

## 2018-05-07 HISTORY — DX: Malignant neoplasm of unspecified site of unspecified female breast: C50.919

## 2018-05-07 HISTORY — DX: Malignant (primary) neoplasm, unspecified: C80.1

## 2018-05-07 LAB — POCT PREGNANCY, URINE: Preg Test, Ur: NEGATIVE

## 2018-05-07 SURGERY — BREAST LUMPECTOMY WITH RADIOACTIVE SEED AND SENTINEL LYMPH NODE BIOPSY
Anesthesia: General | Site: Breast | Laterality: Left

## 2018-05-07 MED ORDER — LIDOCAINE 2% (20 MG/ML) 5 ML SYRINGE
INTRAMUSCULAR | Status: DC | PRN
  Administered 2018-05-07: 60 mg via INTRAVENOUS

## 2018-05-07 MED ORDER — SCOPOLAMINE 1 MG/3DAYS TD PT72
1.0000 | MEDICATED_PATCH | Freq: Once | TRANSDERMAL | Status: DC | PRN
  Administered 2018-05-07: 1.5 mg via TRANSDERMAL

## 2018-05-07 MED ORDER — FENTANYL CITRATE (PF) 100 MCG/2ML IJ SOLN
50.0000 ug | INTRAMUSCULAR | Status: DC | PRN
  Administered 2018-05-07: 100 ug via INTRAVENOUS

## 2018-05-07 MED ORDER — PROPOFOL 10 MG/ML IV BOLUS
INTRAVENOUS | Status: AC
  Filled 2018-05-07: qty 20

## 2018-05-07 MED ORDER — CEFAZOLIN SODIUM-DEXTROSE 2-4 GM/100ML-% IV SOLN
INTRAVENOUS | Status: AC
  Filled 2018-05-07: qty 100

## 2018-05-07 MED ORDER — PROPOFOL 500 MG/50ML IV EMUL
INTRAVENOUS | Status: DC | PRN
  Administered 2018-05-07: 25 ug/kg/min via INTRAVENOUS

## 2018-05-07 MED ORDER — ACETAMINOPHEN 500 MG PO TABS
1000.0000 mg | ORAL_TABLET | ORAL | Status: AC
  Administered 2018-05-07: 1000 mg via ORAL

## 2018-05-07 MED ORDER — FENTANYL CITRATE (PF) 100 MCG/2ML IJ SOLN
INTRAMUSCULAR | Status: AC
  Filled 2018-05-07: qty 2

## 2018-05-07 MED ORDER — LACTATED RINGERS IV SOLN
INTRAVENOUS | Status: DC
  Administered 2018-05-07 (×2): via INTRAVENOUS

## 2018-05-07 MED ORDER — BUPIVACAINE HCL (PF) 0.25 % IJ SOLN
INTRAMUSCULAR | Status: DC | PRN
  Administered 2018-05-07: 9 mL

## 2018-05-07 MED ORDER — PROMETHAZINE HCL 25 MG/ML IJ SOLN: 6.2500 mg | INTRAMUSCULAR | Status: DC | PRN

## 2018-05-07 MED ORDER — FENTANYL CITRATE (PF) 100 MCG/2ML IJ SOLN
25.0000 ug | INTRAMUSCULAR | Status: DC | PRN
  Administered 2018-05-07 (×2): 50 ug via INTRAVENOUS

## 2018-05-07 MED ORDER — DEXAMETHASONE SODIUM PHOSPHATE 10 MG/ML IJ SOLN
INTRAMUSCULAR | Status: DC | PRN
  Administered 2018-05-07: 10 mg via INTRAVENOUS

## 2018-05-07 MED ORDER — MIDAZOLAM HCL 2 MG/2ML IJ SOLN
INTRAMUSCULAR | Status: AC
  Filled 2018-05-07: qty 2

## 2018-05-07 MED ORDER — PROPOFOL 10 MG/ML IV BOLUS
INTRAVENOUS | Status: DC | PRN
  Administered 2018-05-07: 120 mg via INTRAVENOUS

## 2018-05-07 MED ORDER — GABAPENTIN 300 MG PO CAPS
ORAL_CAPSULE | ORAL | Status: AC
  Filled 2018-05-07: qty 1

## 2018-05-07 MED ORDER — FENTANYL CITRATE (PF) 100 MCG/2ML IJ SOLN
INTRAMUSCULAR | Status: DC | PRN
  Administered 2018-05-07: 50 ug via INTRAVENOUS

## 2018-05-07 MED ORDER — SUCCINYLCHOLINE CHLORIDE 200 MG/10ML IV SOSY
PREFILLED_SYRINGE | INTRAVENOUS | Status: DC | PRN
  Administered 2018-05-07: 100 mg via INTRAVENOUS

## 2018-05-07 MED ORDER — TECHNETIUM TC 99M SULFUR COLLOID FILTERED
1.0000 | Freq: Once | INTRAVENOUS | Status: AC | PRN
  Administered 2018-05-07: 1 via INTRADERMAL

## 2018-05-07 MED ORDER — EPHEDRINE SULFATE-NACL 50-0.9 MG/10ML-% IV SOSY
PREFILLED_SYRINGE | INTRAVENOUS | Status: DC | PRN
  Administered 2018-05-07 (×3): 5 mg via INTRAVENOUS

## 2018-05-07 MED ORDER — SCOPOLAMINE 1 MG/3DAYS TD PT72
MEDICATED_PATCH | TRANSDERMAL | Status: AC
  Filled 2018-05-07: qty 1

## 2018-05-07 MED ORDER — DEXAMETHASONE SODIUM PHOSPHATE 10 MG/ML IJ SOLN
INTRAMUSCULAR | Status: AC
  Filled 2018-05-07: qty 1

## 2018-05-07 MED ORDER — KETOROLAC TROMETHAMINE 15 MG/ML IJ SOLN
INTRAMUSCULAR | Status: AC
  Filled 2018-05-07: qty 1

## 2018-05-07 MED ORDER — PHENYLEPHRINE 40 MCG/ML (10ML) SYRINGE FOR IV PUSH (FOR BLOOD PRESSURE SUPPORT)
PREFILLED_SYRINGE | INTRAVENOUS | Status: DC | PRN
  Administered 2018-05-07: 40 ug via INTRAVENOUS

## 2018-05-07 MED ORDER — CEFAZOLIN SODIUM-DEXTROSE 2-4 GM/100ML-% IV SOLN
2.0000 g | INTRAVENOUS | Status: AC
  Administered 2018-05-07: 2 g via INTRAVENOUS

## 2018-05-07 MED ORDER — KETOROLAC TROMETHAMINE 15 MG/ML IJ SOLN
15.0000 mg | INTRAMUSCULAR | Status: AC
  Administered 2018-05-07: 15 mg via INTRAVENOUS

## 2018-05-07 MED ORDER — GABAPENTIN 300 MG PO CAPS
300.0000 mg | ORAL_CAPSULE | ORAL | Status: AC
  Administered 2018-05-07: 300 mg via ORAL

## 2018-05-07 MED ORDER — ENSURE PRE-SURGERY PO LIQD: 296.0000 mL | Freq: Once | ORAL | Status: DC

## 2018-05-07 MED ORDER — LIDOCAINE 2% (20 MG/ML) 5 ML SYRINGE
INTRAMUSCULAR | Status: AC
  Filled 2018-05-07: qty 5

## 2018-05-07 MED ORDER — MIDAZOLAM HCL 2 MG/2ML IJ SOLN
1.0000 mg | INTRAMUSCULAR | Status: DC | PRN
  Administered 2018-05-07: 2 mg via INTRAVENOUS

## 2018-05-07 MED ORDER — ONDANSETRON HCL 4 MG/2ML IJ SOLN
INTRAMUSCULAR | Status: DC | PRN
  Administered 2018-05-07 (×2): 4 mg via INTRAVENOUS

## 2018-05-07 MED ORDER — ONDANSETRON HCL 4 MG/2ML IJ SOLN
INTRAMUSCULAR | Status: AC
  Filled 2018-05-07: qty 2

## 2018-05-07 MED ORDER — ACETAMINOPHEN 500 MG PO TABS
ORAL_TABLET | ORAL | Status: AC
  Filled 2018-05-07: qty 2

## 2018-05-07 MED ORDER — OXYCODONE HCL 5 MG PO TABS: 5.0000 mg | ORAL_TABLET | Freq: Four times a day (QID) | ORAL | 0 refills | Status: DC | PRN

## 2018-05-07 MED FILL — oxyCODONE HCL 5 MG TABS: 5 | 3 days supply | Qty: 10 | Fill #0

## 2018-05-07 SURGICAL SUPPLY — 49 items
APPLIER CLIP 9.375 MED OPEN (MISCELLANEOUS) ×3
BINDER BREAST XLRG (GAUZE/BANDAGES/DRESSINGS) ×3 IMPLANT
BLADE SURG 15 STRL LF DISP TIS (BLADE) ×1 IMPLANT
BLADE SURG 15 STRL SS (BLADE) ×2
CANISTER SUCT 1200ML W/VALVE (MISCELLANEOUS) ×3 IMPLANT
CHLORAPREP W/TINT 26ML (MISCELLANEOUS) ×3 IMPLANT
CLIP APPLIE 9.375 MED OPEN (MISCELLANEOUS) ×1 IMPLANT
CLIP VESOCCLUDE SM WIDE 6/CT (CLIP) ×3 IMPLANT
CLOSURE WOUND 1/2 X4 (GAUZE/BANDAGES/DRESSINGS) ×1
COVER BACK TABLE 60X90IN (DRAPES) ×3 IMPLANT
COVER MAYO STAND STRL (DRAPES) ×3 IMPLANT
COVER PROBE W GEL 5X96 (DRAPES) ×3 IMPLANT
DERMABOND ADVANCED (GAUZE/BANDAGES/DRESSINGS) ×2
DERMABOND ADVANCED .7 DNX12 (GAUZE/BANDAGES/DRESSINGS) ×1 IMPLANT
DEVICE DUBIN W/COMP PLATE 8390 (MISCELLANEOUS) ×3 IMPLANT
DRAPE LAPAROSCOPIC ABDOMINAL (DRAPES) ×3 IMPLANT
DRAPE UTILITY XL STRL (DRAPES) ×3 IMPLANT
ELECT COATED BLADE 2.86 ST (ELECTRODE) ×3 IMPLANT
ELECT REM PT RETURN 9FT ADLT (ELECTROSURGICAL) ×3
ELECTRODE REM PT RTRN 9FT ADLT (ELECTROSURGICAL) ×1 IMPLANT
GLOVE BIOGEL PI IND STRL 7.0 (GLOVE) ×2 IMPLANT
GLOVE BIOGEL PI IND STRL 7.5 (GLOVE) ×1 IMPLANT
GLOVE BIOGEL PI INDICATOR 7.0 (GLOVE) ×4
GLOVE BIOGEL PI INDICATOR 7.5 (GLOVE) ×2
GLOVE SURG SS PI 6.5 STRL IVOR (GLOVE) ×3 IMPLANT
GLOVE SURG SS PI 7.0 STRL IVOR (GLOVE) ×3 IMPLANT
GOWN STRL REUS W/ TWL LRG LVL3 (GOWN DISPOSABLE) ×2 IMPLANT
GOWN STRL REUS W/TWL LRG LVL3 (GOWN DISPOSABLE) ×4
HEMOSTAT ARISTA ABSORB 3G PWDR (MISCELLANEOUS) ×3 IMPLANT
ILLUMINATOR WAVEGUIDE N/F (MISCELLANEOUS) ×3 IMPLANT
KIT MARKER MARGIN INK (KITS) ×3 IMPLANT
NEEDLE HYPO 25X1 1.5 SAFETY (NEEDLE) ×3 IMPLANT
NS IRRIG 1000ML POUR BTL (IV SOLUTION) ×3 IMPLANT
PACK BASIN DAY SURGERY FS (CUSTOM PROCEDURE TRAY) ×3 IMPLANT
PENCIL BUTTON HOLSTER BLD 10FT (ELECTRODE) ×3 IMPLANT
SLEEVE SCD COMPRESS KNEE MED (MISCELLANEOUS) ×3 IMPLANT
SPONGE LAP 4X18 RFD (DISPOSABLE) ×3 IMPLANT
STRIP CLOSURE SKIN 1/2X4 (GAUZE/BANDAGES/DRESSINGS) ×2 IMPLANT
SUT MNCRL AB 4-0 PS2 18 (SUTURE) ×3 IMPLANT
SUT VIC AB 2-0 SH 27 (SUTURE) ×4
SUT VIC AB 2-0 SH 27XBRD (SUTURE) ×2 IMPLANT
SUT VIC AB 3-0 SH 27 (SUTURE) ×4
SUT VIC AB 3-0 SH 27X BRD (SUTURE) ×2 IMPLANT
SYR CONTROL 10ML LL (SYRINGE) ×3 IMPLANT
TOWEL GREEN STERILE FF (TOWEL DISPOSABLE) ×3 IMPLANT
TOWEL OR NON WOVEN STRL DISP B (DISPOSABLE) ×3 IMPLANT
TUBE CONNECTING 20'X1/4 (TUBING) ×1
TUBE CONNECTING 20X1/4 (TUBING) ×2 IMPLANT
YANKAUER SUCT BULB TIP NO VENT (SUCTIONS) ×3 IMPLANT

## 2018-05-07 NOTE — Transfer of Care (Signed)
Immediate Anesthesia Transfer of Care Note  Patient: Anne Horton [facility]  Procedure(s) Performed: BREAST LUMPECTOMY WITH RADIOACTIVE SEED AND SENTINEL LYMPH NODE BIOPSY (Left Breast)  Patient Location: PACU  Anesthesia Type:General  Level of Consciousness: drowsy  Airway & Oxygen Therapy: Patient Spontanous Breathing and Patient connected to face mask oxygen  Post-op Assessment: Report given to RN and Post -op Vital signs reviewed and stable  Post vital signs: Reviewed and stable  Last Vitals:  Vitals Value Taken Time  BP    Temp    Pulse 96 05/07/2018  3:34 PM  Resp 26 05/07/2018  3:34 PM  SpO2 98 % 05/07/2018  3:34 PM    Last Pain:  Vitals:   05/07/18 1128  TempSrc: Oral  PainSc: 0-No pain      Patients Stated Pain Goal: 0 (42/37/02 3017)  Complications: No apparent anesthesia complications

## 2018-05-07 NOTE — Progress Notes (Signed)
Assisted Dr. Turk with left, ultrasound guided, pectoralis block. Side rails up, monitors on throughout procedure. See vital signs in flow sheet. Tolerated Procedure well. 

## 2018-05-07 NOTE — Op Note (Signed)
Preoperative diagnosis:Left breast cancer, clinical stage I Postoperative diagnosis: same as above Procedure: Left breast seed guided lumpectomy Leftdeep axillary sentinel node biopsy Surgeon: Dr Serita Grammes XEN:[id number] Anes: general  Specimens  1.left breast tissue marked with paint 2.leftaxillary sentinel nodes with highest [id number] Complications none Drains none Sponge count correct Dispo to pacu stable  Indications: This is a42yof who has clinical stage I right breast cancer.We discussed lumpectomy andsn biopsy.she had seed placed prior to beginning.her genetics were negative. She has mri finding on right that is pash and will be followed.    Procedure: After informed consent was obtained the patient was taken to the operating room. She first was given technetium in standard periareolar fashion. She had a pectoral block. She was given antibiotics. Sequential compression devices were on her legs. She was then placed under general anesthesia with an LMA. Then she was prepped and draped in the standard sterile surgical fashion. Surgical timeout was then performed.  I then located the seed in theupper outer left breast.I infiltrated marcaine and a curvilinear incision high in the left breast.  I then used the neoprobe to remove the seed and the surrounding tissue with attempt to get clear margins.I marked this with paint. MM confirmed removal of seed and theclip. I placed clips in the cavity.I then obtained hemostasis. This was marked as above.The breast was closed with 2-0 vicryl, the skin with 3-0 vicryl and 4-0 monocryl. Glue and steristrips were applied. I then infiltrated marcaine in the low axilla and made an incision at the hairline. I carried this through the axillary fascia.There was radioactivity present that was easily noted I removed the radioactive nodes.The background radioactivity was less than 10% and there were no more identifiable  nodes..I then obtained hemostasis. I closed the axillary fascia with 2-0 vicryl.The skin was closed with 3-0 vicryl and 4-0 monocryl. Glue and steristrips were placed. She was extubated in or and transferred to pacu stable

## 2018-05-07 NOTE — Anesthesia Procedure Notes (Signed)
Procedure Name: Intubation Date/Time: 05/07/2018 2:11 PM Performed by: Gwyndolyn Saxon, CRNA Pre-anesthesia Checklist: Patient identified, Emergency Drugs available, Suction available and Patient being monitored Patient Re-evaluated:Patient Re-evaluated prior to induction Oxygen Delivery Method: Circle system utilized Preoxygenation: Pre-oxygenation with 100% oxygen Induction Type: IV induction and Rapid sequence Laryngoscope Size: Miller and 2 Grade View: Grade I Tube type: Oral Tube size: 7.0 mm Number of attempts: 1 Placement Confirmation: ETT inserted through vocal cords under direct vision,  positive ETCO2,  CO2 detector and breath sounds checked- equal and bilateral Secured at: 21 cm Tube secured with: Tape Dental Injury: Teeth and Oropharynx as per pre-operative assessment

## 2018-05-07 NOTE — Discharge Instructions (Signed)
[place] Office Phone Number [phone]  POST OP INSTRUCTIONS Take 650 mg of tylenol every six hours or 800 mg of ibuprofen every 8 hours for 72 hours then as needed. Use ice several times daily for next 3 days.  Always review your discharge instruction sheet given to you by the facility where your surgery was performed.  IF YOU HAVE DISABILITY OR FAMILY LEAVE FORMS, YOU MUST BRING THEM TO THE OFFICE FOR PROCESSING.  DO NOT GIVE THEM TO YOUR DOCTOR.  1. A prescription for pain medication may be given to you upon discharge.  Take your pain medication as prescribed, if needed.  If narcotic pain medicine is not needed, then you may take acetaminophen (Tylenol), naprosyn (Alleve) or ibuprofen (Advil) as needed. 2. Take your usually prescribed medications unless otherwise directed 3. If you need a refill on your pain medication, please contact your pharmacy.  They will contact our office to request authorization.  Prescriptions will not be filled after 5pm or on week-ends. 4. You should eat very light the first 24 hours after surgery, such as soup, crackers, pudding, etc.  Resume your normal diet the day after surgery. 5. Most patients will experience some swelling and bruising in the breast.  Ice packs and a good support bra will help.  Wear the breast binder provided or a sports bra for 72 hours day and night.  After that wear a sports bra during the day until you return to the office. Swelling and bruising can take several days to resolve.  6. It is common to experience some constipation if taking pain medication after surgery.  Increasing fluid intake and taking a stool softener will usually help or prevent this problem from occurring.  A mild laxative (Milk of Magnesia or Miralax) should be taken according to package directions if there are no bowel movements after 48 hours. 7. Unless discharge instructions indicate otherwise, you may remove your bandages 48 hours after surgery  and you may shower at that time.  You may have steri-strips (small skin tapes) in place directly over the incision.  These strips should be left on the skin for 7-10 days and will come off on their own.  If your surgeon used skin glue on the incision, you may shower in 24 hours.  The glue will flake off over the next 2-3 weeks.  Any sutures or staples will be removed at the office during your follow-up visit. 8. ACTIVITIES:  You may resume regular daily activities (gradually increasing) beginning the next day.  Wearing a good support bra or sports bra minimizes pain and swelling.  You may have sexual intercourse when it is comfortable. a. You may drive when you no longer are taking prescription pain medication, you can comfortably wear a seatbelt, and you can safely maneuver your car and apply brakes. b. RETURN TO WORK:  ______________________________________________________________________________________ 9. You should see your doctor in the office for a follow-up appointment approximately two weeks after your surgery.  Your doctors nurse will typically make your follow-up appointment when she calls you with your pathology report.  Expect your pathology report 3-4 business days after your surgery.  You may call to check if you do not hear from [place] after three days. 10. OTHER INSTRUCTIONS: _______________________________________________________________________________________________ _____________________________________________________________________________________________________________________________________ _____________________________________________________________________________________________________________________________________ _____________________________________________________________________________________________________________________________________  WHEN TO CALL DR WAKEFIELD: 1. Fever over 101.0 2. Nausea and/or vomiting. 3. Extreme swelling or bruising. 4. Continued  bleeding from incision. 5. Increased pain, redness, or drainage from the incision.  The clinic  staff is available to answer your questions during regular business hours.  Please dont hesitate to call and ask to speak to one of the nurses for clinical concerns.  If you have a medical emergency, go to the nearest emergency room or call 911.  A surgeon from [facility] is always on call at the hospital.  For further questions, please visit [email or web address]     Post Anesthesia Home Care Instructions  Activity: Get plenty of rest for the remainder of the day. A responsible individual must stay with you for 24 hours following the procedure.  For the next 24 hours, DO NOT: -Drive a car -Paediatric nurse -Drink alcoholic beverages -Take any medication unless instructed by your physician -Make any legal decisions or sign important papers.  Meals: Start with liquid foods such as gelatin or soup. Progress to regular foods as tolerated. Avoid greasy, spicy, heavy foods. If nausea and/or vomiting occur, drink only clear liquids until the nausea and/or vomiting subsides. Call your physician if vomiting continues.  Special Instructions/Symptoms: Your throat may feel dry or sore from the anesthesia or the breathing tube placed in your throat during surgery. If this causes discomfort, gargle with warm salt water. The discomfort should disappear within 24 hours.  If you had a scopolamine patch placed behind your ear for the management of post- operative nausea and/or vomiting:  1. The medication in the patch is effective for 72 hours, after which it should be removed.  Wrap patch in a tissue and discard in the trash. Wash hands thoroughly with soap and water. 2. You may remove the patch earlier than 72 hours if you experience unpleasant side effects which may include dry mouth, dizziness or visual disturbances. 3. Avoid touching the patch. Wash your hands with soap and water  after contact with the patch.     Regional Anesthesia Blocks  1. Numbness or the inability to move the "blocked" extremity may last from 3-48 hours after placement. The length of time depends on the medication injected and your individual response to the medication. If the numbness is not going away after 48 hours, call your surgeon.  2. The extremity that is blocked will need to be protected until the numbness is gone and the  Strength has returned. Because you cannot feel it, you will need to take extra care to avoid injury. Because it may be weak, you may have difficulty moving it or using it. You may not know what position it is in without looking at it while the block is in effect.  3. For blocks in the legs and feet, returning to weight bearing and walking needs to be done carefully. You will need to wait until the numbness is entirely gone and the strength has returned. You should be able to move your leg and foot normally before you try and bear weight or walk. You will need someone to be with you when you first try to ensure you do not fall and possibly risk injury.  4. Bruising and tenderness at the needle site are common side effects and will resolve in a few days.  5. Persistent numbness or new problems with movement should be communicated to the surgeon or the [place] [phone] [place] [phone]).

## 2018-05-07 NOTE — Anesthesia Preprocedure Evaluation (Signed)
Anesthesia Evaluation  Patient identified by MRN, date of birth, ID band Patient awake    Reviewed: Allergy & Precautions, NPO status , Patient's Chart, lab work & pertinent test results, reviewed documented beta blocker date and time   Airway Mallampati: II  TM Distance: >3 FB Neck ROM: Full    Dental  (+) Teeth Intact, Dental Advisory Given   Pulmonary neg pulmonary ROS,    Pulmonary exam normal breath sounds clear to auscultation       Cardiovascular Normal cardiovascular exam+ Valvular Problems/Murmurs MR  Rhythm:Regular Rate:Normal  Palpitations    Neuro/Psych  Headaches, PSYCHIATRIC DISORDERS Anxiety Depression Neurofibromatosis   Neuromuscular disease    GI/Hepatic negative GI ROS, Neg liver ROS,   Endo/Other  negative endocrine ROS  Renal/GU negative Renal ROS     Musculoskeletal  (+) Fibromyalgia -  Abdominal   Peds  Hematology negative hematology ROS (+)   Anesthesia Other Findings Day of surgery medications reviewed with the patient.  Reproductive/Obstetrics                             Anesthesia Physical Anesthesia Plan  ASA: II  Anesthesia Plan: General   Post-op Pain Management:  Regional for Post-op pain   Induction: Intravenous  PONV Risk Score and Plan: 3 and Midazolam, Dexamethasone and Ondansetron  Airway Management Planned: LMA  Additional Equipment:   Intra-op Plan:   Post-operative Plan: Extubation in OR  Informed Consent: I have reviewed the patients History and Physical, chart, labs and discussed the procedure including the risks, benefits and alternatives for the proposed anesthesia with the patient or authorized representative who has indicated his/her understanding and acceptance.   Dental advisory given  Plan Discussed with: CRNA  Anesthesia Plan Comments:         Anesthesia Quick Evaluation

## 2018-05-07 NOTE — H&P (Addendum)
42 yof referred by Ms Nicki Reaper for new left breast cancer. she has fh in her mom who initially had breast cancer at age 44, no genetics done. she is here at appt today doing well. 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 she has nf-2. had screening mm with c density breasts. no mass or dc currently. noted to have left breast mass/distortion. I have reviewed mm and this is just at edge of very dense breast tissue. US showed a 1.2x1.1x0.8 cm mass with negative axillary [place]. core biopsy has grade I IDC that is er/pr pos, her 2 negative and Ki is 1%. she saw GC Ferol Luz last week and has testing pending. she is here to discuss options   Past Surgical History  Breast Biopsy  Left. Cesarean Section - 1  Gallbladder Surgery - Laparoscopic   Diagnostic Studies History Colonoscopy  1-5 years ago 5-10 years ago Mammogram  within last year 1-3 years ago Pap Smear  1-5 years ago  Allergies  Latex Exam Gloves *MEDICAL DEVICES AND SUPPLIES*  Allergies Reconciled   Medication History  Xanax (0.5MG  Tablet, Oral as needed) Active. ZyrTEC Allergy (10MG  Tablet, Oral) Active. Vitamin B 12 (100MCG Lozenge, Oral) Active. Deplin 15 (15-90.314MG  Capsule, Oral) Active. Jolessa (0.15-0.03MG  Tablet, Oral) Active. Metoprolol Succinate ER (50MG  Tablet ER 24HR, Oral) Active. Propranolol HCl (10MG  Tablet, Oral) Active. Omeprazole (40MG  Capsule DR, Oral two times daily) Active. Medications Reconciled  Social History  Caffeine use  Carbonated beverages, Tea. No alcohol use  No drug use  Tobacco use  Never smoker.  Family History  Alcohol Abuse  Family Members In General. Bleeding disorder  Mother. Breast Cancer  Mother. Cancer  Mother. Cervical Cancer  Mother. Diabetes Mellitus  Family Members In General, Mother. Heart Disease  Family Members In General. Migraine Headache  Mother.  Pregnancy / Birth History  Age at menarche  1 years. Contraceptive History  Oral  contraceptives. Gravida  2 Maternal age  69-30 Para  1 Regular periods   Other Problems Anxiety Disorder  Breast Cancer  Depression  Lump In Breast   Review of Systems General Present- Appetite Loss, Fatigue and Night Sweats. Not Present- Chills, Fever, Weight Gain and Weight Loss. Skin Not Present- Change in Wart/Mole, Dryness, Hives, Jaundice, New Lesions, Non-Healing Wounds, Rash and Ulcer. HEENT Present- Seasonal Allergies and Wears glasses/contact lenses. Not Present- Earache, Hearing Loss, Hoarseness, Nose Bleed, Oral Ulcers, Ringing in the Ears, Sinus Pain, Sore Throat, Visual Disturbances and Yellow Eyes. Breast Present- Breast Mass and Breast Pain. Not Present- Nipple Discharge and Skin Changes. Cardiovascular Not Present- Chest Pain, Difficulty Breathing Lying Down, Leg Cramps, Palpitations, Rapid Heart Rate, Shortness of Breath and Swelling of Extremities. Gastrointestinal Not Present- Abdominal Pain, Bloating, Bloody Stool, Change in Bowel Habits, Chronic diarrhea, Constipation, Difficulty Swallowing, Excessive gas, Gets full quickly at meals, Hemorrhoids, Indigestion, Nausea, Rectal Pain and Vomiting. Female Genitourinary Not Present- Frequency, Nocturia, Painful Urination, Pelvic Pain and Urgency. Musculoskeletal Not Present- Back Pain, Joint Pain, Joint Stiffness, Muscle Pain, Muscle Weakness and Swelling of Extremities. Neurological Not Present- Decreased Memory, Fainting, Headaches, Numbness, Seizures, Tingling, Tremor, Trouble walking and Weakness. Psychiatric Present- Anxiety and Depression. Not Present- Bipolar, Change in Sleep Pattern, Fearful and Frequent crying. Endocrine Not Present- Cold Intolerance, Excessive Hunger, Hair Changes, Heat Intolerance, Hot flashes and New Diabetes. Hematology Not Present- Blood Thinners, Easy Bruising, Excessive bleeding, Gland problems, HIV and Persistent Infections.  Vitals  Weight: 141.5 lb Height: 62in Body Surface  Area: 1.65 m Body  Mass Index: 25.88 kg/m  Temp.: 98.77F(Oral)  Pulse: 95 (Regular)  BP: 110/82 (Sitting, Left Arm, Standard) Physical Exam  General Mental Status-Alert. Integumentary Note: multiple neurofibromas Head and Neck Trachea-midline. Thyroid Gland Characteristics - normal size and consistency. Eye Sclera/Conjunctiva - Bilateral-No scleral icterus. Chest and Lung Exam Chest and lung exam reveals -quiet, even and easy respiratory effort with no use of accessory muscles and on auscultation, normal breath sounds, no adventitious sounds and normal vocal resonance. Breast Nipples-No Discharge. Breast Lump-No Palpable Breast Mass. Cardiovascular Cardiovascular examination reveals -normal heart sounds, regular rate and rhythm with no murmurs. Abdomen Note: lap chole scars healed, soft nt Neurologic Neurologic evaluation reveals -alert and oriented x 3 with no impairment of recent or remote memory. Neuropsychiatric Mental status exam performed with findings of-Oriented X3 with appropriate mood and affect. Lymphatic Head & Neck General Head & Neck Lymphatics: Bilateral - Description - Normal. Axillary General Axillary Region: Bilateral - Description - Normal. Note: no [place] adenopathy  BREAST CANCER OF UPPER-OUTER QUADRANT OF LEFT FEMALE BREAST (C50.412) Left breast lumpectomy sn biopsy We discussed a sentinel lymph node biopsy as she does not appear to having lymph node involvement right now. We discussed the performance of that with injection of radioactive tracer. We discussed that there is a chance of having a positive node with a sentinel lymph node biopsy and we will await the permanent pathology to make any other first further decisions in terms of her treatment. One of these options might be to return to the operating room to perform an axillary lymph node dissection. We discussed up to a 5% risk lifetime of chronic shoulder pain as well as  lymphedema associated with a sentinel lymph node biopsy. We discussed the options for treatment of the breast cancer which included lumpectomy versus a mastectomy. We discussed the performance of the lumpectomy with radioactive seed placement. We discussed a 5% chance of a positive margin requiring reexcision in the operating room. We also discussed that she will need radiation therapy if she undergoes lumpectomy. We discussed the mastectomy and the postoperative care for that as well. Mastectomy can be followed by reconstruction. The decision for lumpectomy vs mastectomy has no impact on decision for chemotherapy. Most mastectomy patients will not need radiation therapy. We discussed that there is no difference in her survival whether she undergoes lumpectomy with radiation therapy or antiestrogen therapy versus a mastectomy. There is also no real difference between her recurrence in the breast.

## 2018-05-07 NOTE — Interval H&P Note (Signed)
History and Physical Interval Note:  05/07/2018 1:31 PM  Anne Horton  has presented today for surgery, with the diagnosis of LEFT BREAST CANCER  The various methods of treatment have been discussed with the patient and family. After consideration of risks, benefits and other options for treatment, the patient has consented to  Procedure(s): BREAST LUMPECTOMY WITH RADIOACTIVE SEED AND SENTINEL LYMPH NODE BIOPSY (Left) as a surgical intervention .  The patient's history has been reviewed, patient examined, no change in status, stable for surgery.  I have reviewed the patient's chart and labs.  Questions were answered to the patient's satisfaction.     Rolm Bookbinder

## 2018-05-07 NOTE — Progress Notes (Signed)
Emotional support during breast injections °

## 2018-05-08 ENCOUNTER — Encounter (HOSPITAL_BASED_OUTPATIENT_CLINIC_OR_DEPARTMENT_OTHER): Payer: Self-pay | Admitting: General Surgery

## 2018-05-08 NOTE — Anesthesia Postprocedure Evaluation (Signed)
Anesthesia Post Note  Patient: Anne Horton  Procedure(s) Performed: BREAST LUMPECTOMY WITH RADIOACTIVE SEED AND SENTINEL LYMPH NODE BIOPSY (Left Breast)     Patient location during evaluation: PACU Anesthesia Type: General Level of consciousness: awake and alert, oriented and awake Pain management: pain level controlled Vital Signs Assessment: post-procedure vital signs reviewed and stable Respiratory status: spontaneous breathing, nonlabored ventilation and respiratory function stable Cardiovascular status: blood pressure returned to baseline and stable Postop Assessment: no apparent nausea or vomiting Anesthetic complications: no    Last Vitals:  Vitals:   05/07/18 1534 05/07/18 1644  BP:  132/65  Pulse: 96 94  Resp: (!) 26 18  Temp: 36.9 C 36.7 C  SpO2: 98% 97%    Last Pain:  Vitals:   05/08/18 1035  TempSrc:   PainSc: 4                  Catalina Gravel

## 2018-05-14 ENCOUNTER — Telehealth: Payer: Self-pay | Admitting: *Deleted

## 2018-05-14 NOTE — Telephone Encounter (Signed)
Received order for oncotype testing. Requisition faxed to pathology. Received by Keisha 

## 2018-05-15 ENCOUNTER — Ambulatory Visit: Payer: 59 | Admitting: Hematology and Oncology

## 2018-05-15 ENCOUNTER — Inpatient Hospital Stay: Payer: 59 | Attending: Genetic Counselor | Admitting: Hematology and Oncology

## 2018-05-15 DIAGNOSIS — Z79899 Other long term (current) drug therapy: Secondary | ICD-10-CM | POA: Insufficient documentation

## 2018-05-15 DIAGNOSIS — G893 Neoplasm related pain (acute) (chronic): Secondary | ICD-10-CM | POA: Diagnosis not present

## 2018-05-15 DIAGNOSIS — Z170 Estrogen receptor positive status [ER+]: Secondary | ICD-10-CM | POA: Diagnosis not present

## 2018-05-15 DIAGNOSIS — C50412 Malignant neoplasm of upper-outer quadrant of left female breast: Secondary | ICD-10-CM | POA: Insufficient documentation

## 2018-05-15 NOTE — Assessment & Plan Note (Addendum)
05/07/2018:Left lumpectomy: IDC grade 1, 1.2 cm, intermediate grade DCIS, margins negative,PASH, 0/7 sentinel lymph nodes negative, ER 95%, PR 95%, Ki-67 1%, HER-2 negative ratio 1.47, T1CN0 stage Ia  Pathology counseling: I discussed the final pathology report of the patient provided  a copy of this report. I discussed the margins as well as lymph node surgeries. We also discussed the final staging along with previously performed ER/PR and HER-2/neu testing.  Treatment plan: 1. Oncotype DX testing to determine if chemotherapy would be of any benefit followed by 2. Adjuvant radiation therapy followed by 3. Adjuvant antiestrogen therapy

## 2018-05-15 NOTE — Progress Notes (Signed)
Patient Care Team: Daphene Calamity as PCP - General (Family Medicine) [name], Wonda Cheng, MD as PCP - Cardiology (Cardiology)  DIAGNOSIS:  Encounter Diagnosis  Name Primary?  . Malignant neoplasm of upper-outer quadrant of left breast in female, estrogen receptor positive ([facility])     SUMMARY OF ONCOLOGIC HISTORY:   Malignant neoplasm of upper-outer quadrant of left breast in female, estrogen receptor positive ([place])   03/20/2018 Initial Diagnosis    Screening detected left breast spiculated mass by ultrasound measured 1.2 cm.  Biopsy revealed invasive ductal carcinoma grade 1-2 with high-grade DCIS, ER 95%, PR 95%, Ki-67 1%, HER-2 negative ratio 1.47, T1CN0 stage I a clinical stage    05/07/2018 Surgery    Left lumpectomy: IDC grade 1, 1.2 cm, intermediate grade DCIS, margins negative,PASH, 0/7 sentinel lymph nodes negative, ER 95%, PR 95%, Ki-67 1%, HER-2 negative ratio 1.47, T1CN0 stage Ia     CHIEF COMPLIANT: Follow-up after recent left lumpectomy  INTERVAL HISTORY: Anne Horton is a 42 year old with above-mentioned history of left breast cancer underwent lumpectomy and is here today to discuss pathology report.  She is in extreme amount of pain because she had 40 mL of serous fluid drained from her left axilla.  She has pain medications at home.  The breast does not seem to be bothering her.  REVIEW OF SYSTEMS:   Constitutional: Denies fevers, chills or abnormal weight loss Eyes: Denies blurriness of vision Ears, nose, mouth, throat, and face: Denies mucositis or sore throat Respiratory: Denies cough, dyspnea or wheezes Cardiovascular: Denies palpitation, chest discomfort Gastrointestinal:  Denies nausea, heartburn or change in bowel habits Skin: Denies abnormal skin rashes Lymphatics: Denies new lymphadenopathy or easy bruising Neurological:Denies numbness, tingling or new weaknesses Behavioral/Psych: Mood is stable, no new changes  Extremities: No lower extremity  edema Breast: Lot of pain in the left axilla All other systems were reviewed with the patient and are negative.  I have reviewed the past medical history, past surgical history, social history and family history with the patient and they are unchanged from previous note.  ALLERGIES:  is allergic to latex.  MEDICATIONS:  Current Outpatient Medications  Medication Sig Dispense Refill  . acetaminophen (TYLENOL) 500 MG tablet Take 500 mg by mouth every 6 (six) hours as needed.    . ALPRAZolam (XANAX) 0.5 MG tablet Take 0.5 mg by mouth at bedtime as needed (FOR ANXIETY). :take 1/2 to 1 twice a day as needed    . cetirizine (ZYRTEC) 10 MG tablet Take 10 mg by mouth as needed for allergies.     . DULoxetine (CYMBALTA) 60 MG capsule Take 60 mg by mouth daily.     Marland Kitchen ibuprofen (ADVIL,MOTRIN) 200 MG tablet Take 800 mg by mouth daily as needed for headache.    . metoprolol succinate (TOPROL-XL) 50 MG 24 hr tablet TAKE 1 TABLET BY MOUTH DAILY. TAKE WITH OR IMMEDIATELY FOLLOWING A MEAL. 90 tablet 3  . oxyCODONE (OXY IR/ROXICODONE) 5 MG immediate release tablet Take 1 tablet (5 mg total) by mouth every 6 (six) hours as needed for moderate pain, severe pain or breakthrough pain. 10 tablet 0  . propranolol (INDERAL) 10 MG tablet TAKE 1 TABLET BY MOUTH FOUR TIMES DAILY AS NEEDED FOR PALPITATIONS 120 tablet 3   No current facility-administered medications for this visit.     PHYSICAL EXAMINATION: ECOG PERFORMANCE STATUS: 1 - Symptomatic but completely ambulatory  Vitals:   05/15/18 1123  BP: 132/66  Pulse: 86  Resp:  18  Temp: 98 F (36.7 C)  SpO2: 100%   Filed Weights   05/15/18 1123  Weight: 144 lb 6.4 oz (65.5 kg)    GENERAL:alert, no distress and comfortable SKIN: skin color, texture, turgor are normal, no rashes or significant lesions EYES: normal, Conjunctiva are pink and non-injected, sclera clear OROPHARYNX:no exudate, no erythema and lips, buccal mucosa, and tongue normal  NECK:  supple, thyroid normal size, non-tender, without nodularity LYMPH:  no palpable lymphadenopathy in the cervical, axillary or inguinal LUNGS: clear to auscultation and percussion with normal breathing effort HEART: regular rate & rhythm and no murmurs and no lower extremity edema ABDOMEN:abdomen soft, non-tender and normal bowel sounds MUSCULOSKELETAL:no cyanosis of digits and no clubbing  NEURO: alert & oriented x 3 with fluent speech, no focal motor/sensory deficits EXTREMITIES: No lower extremity edema   LABORATORY DATA:  I have reviewed the data as listed CMP Latest Ref Rng & Units 03/24/2018 12/29/2014 03/25/2014  Glucose 70 - 99 mg/dL 87 87 80  BUN 6 - 20 mg/dL '10 8 10  ' Creatinine 0.44 - 1.00 mg/dL 0.74 0.61 0.6  Sodium 135 - 145 mmol/L 138 138 137  Potassium 3.5 - 5.1 mmol/L 4.7 4.4 4.0  Chloride 98 - 111 mmol/L 102 103 102  CO2 22 - 32 mmol/L '30 28 30  ' Calcium 8.9 - 10.3 mg/dL 9.4 8.9 9.1  Total Protein 6.5 - 8.1 g/dL 7.1 6.5 6.5  Total Bilirubin 0.3 - 1.2 mg/dL 0.3 0.3 0.5  Alkaline Phos 38 - 126 U/L 71 42 43  AST 15 - 41 U/L '21 19 17  ' ALT 0 - 44 U/L '21 15 15    ' Lab Results  Component Value Date   WBC 7.8 03/24/2018   HGB 10.1 (L) 03/24/2018   HCT 32.2 (L) 03/24/2018   MCV 77.8 (L) 03/24/2018   PLT 424 (H) 03/24/2018   NEUTROABS 4.1 03/24/2018    ASSESSMENT & PLAN:  Malignant neoplasm of upper-outer quadrant of left breast in female, estrogen receptor positive ([facility]) 05/07/2018:Left lumpectomy: IDC grade 1, 1.2 cm, intermediate grade DCIS, margins negative,PASH, 0/7 sentinel lymph nodes negative, ER 95%, PR 95%, Ki-67 1%, HER-2 negative ratio 1.47, T1CN0 stage Ia  Pathology counseling: I discussed the final pathology report of the patient provided  a copy of this report. I discussed the margins as well as lymph node surgeries. We also discussed the final staging along with previously performed ER/PR and HER-2/neu testing.  Treatment plan: 1. Oncotype DX testing to  determine if chemotherapy would be of any benefit followed by 2. Adjuvant radiation therapy followed by 3. Adjuvant antiestrogen therapy    No orders of the defined types were placed in this encounter.  The patient has a good understanding of the overall plan. she agrees with it. she will call with any problems that may develop before the next visit here.   Harriette Ohara, MD 05/15/18

## 2018-05-25 DIAGNOSIS — C50412 Malignant neoplasm of upper-outer quadrant of left female breast: Secondary | ICD-10-CM | POA: Diagnosis not present

## 2018-05-25 DIAGNOSIS — Z170 Estrogen receptor positive status [ER+]: Secondary | ICD-10-CM | POA: Diagnosis not present

## 2018-05-25 MED FILL — ALPRAZolam 0.5 MG TABS: 0.5 | 30 days supply | Qty: 60 | Fill #0

## 2018-05-26 ENCOUNTER — Telehealth: Payer: Self-pay | Admitting: *Deleted

## 2018-05-26 NOTE — Telephone Encounter (Signed)
Received oncotype score of 11/3%. Physician team notified. Informed pt of results and that chemotherapy is not recommended. Informed next step is xrt with Dr. Sondra Come. Received verbal understanding.

## 2018-05-27 ENCOUNTER — Encounter (HOSPITAL_COMMUNITY): Payer: Self-pay | Admitting: Hematology and Oncology

## 2018-05-27 NOTE — Progress Notes (Signed)
Location of Breast Cancer: Malignant neoplasm of upper-outer quadrant of left breast in female, estrogen receptor positive   Histology per Pathology Report: 05/07/18:  1. Breast, lumpectomy, Left w/seed - INVASIVE DUCTAL CARCINOMA, [place] GRADE 1 OF 3, 1.2 CM - DUCTAL CARCINOMA IN SITU, INTERMEDIATE GRADE - CALCIFICATIONS ASSOCIATED WITH CARCINOMA - MARGINS UNINVOLVED BY CARCINOMA (0.5 CM; POSTERIOR MARGIN) - COLUMNAR CELL HYPERPLASIA AND FIBROCYSTIC CHANGES - PSEUDOANGIOMATOUS STROMAL HYPERPLASIA - PREVIOUS BIOPSY SITE CHANGES IDENTIFIED - SEE ONCOLOGY TABLE BELOW 2. Lymph node, sentinel, biopsy, Left Axillary - NO CARCINOMA IDENTIFIED IN ONE LYMPH NODE (0/1) 3. Lymph node, sentinel, biopsy, Left - NO CARCINOMA IDENTIFIED IN ONE LYMPH NODE (0/1) 4. Lymph node, sentinel, biopsy, Left - NO CARCINOMA IDENTIFIED IN ONE LYMPH NODE (0/1) 5. Lymph node, sentinel, biopsy, Left - NO CARCINOMA IDENTIFIED IN ONE LYMPH NODE (0/1) 6. Lymph node, sentinel, biopsy, Left - NO CARCINOMA IDENTIFIED IN ONE LYMPH NODE (0/1) 7. Lymph node, sentinel, biopsy, Left - NO CARCINOMA IDENTIFIED IN ONE LYMPH NODE (0/1) 8. Lymph node, sentinel, biopsy, Left - NO CARCINOMA IDENTIFIED IN ONE LYMPH NODE (0/1)  Receptor Status: ER(95%), PR (95%), Her2-neu (negative), Ki-(1%)  Did patient present with symptoms (if so, please note symptoms) or was this found on screening mammography?: Screening detected left breast spiculated mass by ultrasound measured 1.2 cm. Biopsy revealed invasive ductal carcinoma grade 1-2 with high-grade DCIS, ER 95%, PR 95%, Ki-67 1%, HER-2 negative ratio 1.47, T1CN0 stage I a clinical stage  Past/Anticipated interventions by surgeon, if any: Dr. Donne Hazel 05/07/18: Procedure: Leftbreast seed guided lumpectomy Leftdeep axillary sentinel node biopsy Surgeon: Dr Serita Grammes  Past/Anticipated interventions by medical oncology, if any: Chemotherapy Per Dr. Lindi Adie 05/15/18:  Malignant  neoplasm of upper-outer quadrant of left breast in female, estrogen receptor positive ([place]) 05/07/2018:Left lumpectomy: IDC grade 1, 1.2 cm, intermediate grade DCIS, margins negative,PASH, 0/7 sentinel lymph nodes negative, ER 95%, PR 95%, Ki-67 1%, HER-2 negative ratio 1.47, T1CN0 stage Ia  Pathology counseling: I discussed the final pathology report of the patient provided  a copy of this report. I discussed the margins as well as lymph node surgeries. We also discussed the final staging along with previously performed ER/PR and HER-2/neu testing.  Treatment plan: 1. Oncotype DX testing to determine if chemotherapy would be of any benefit followed by 2. Adjuvant radiation therapy followed by 3. Adjuvant antiestrogen therapy  Per RN Navigator 05/26/18:  Received oncotype score of 11/3%. Physician team notified. Informed pt of results and that chemotherapy is not recommended. Informed next step is xrt with Dr. Sondra Come. Received verbal understanding.  Lymphedema issues, if any:  Pt reports having cording and is awaiting appointment with Lymphedema/PT clinic.  Pain issues, if any:  Pt reports pain associated with breast cording, describes it as "dull", and rates pain 3/10  SAFETY ISSUES:  Prior radiation? No  Pacemaker/ICD? No  Possible current pregnancy? No  Is the patient on methotrexate? No  Current Complaints / other details:  Pt presents today for f/u with Dr. Sondra Come for Radiation Oncology. Pt is post-surgical and has associated breast cording. Pt is awaiting appointment at lymphedema/PT clinic. Pt is unaccompanied.     Loma Sousa, RN 06/01/2018,1:25 PM

## 2018-05-28 DIAGNOSIS — H5213 Myopia, bilateral: Secondary | ICD-10-CM | POA: Diagnosis not present

## 2018-05-29 DIAGNOSIS — C50412 Malignant neoplasm of upper-outer quadrant of left female breast: Secondary | ICD-10-CM | POA: Diagnosis not present

## 2018-06-01 ENCOUNTER — Ambulatory Visit
Admission: RE | Admit: 2018-06-01 | Discharge: 2018-06-01 | Disposition: A | Discharge status: Home / Self Care | Payer: 59 | Source: Ambulatory Visit | Attending: Radiation Oncology | Admitting: Radiation Oncology

## 2018-06-01 ENCOUNTER — Other Ambulatory Visit: Payer: Self-pay

## 2018-06-01 ENCOUNTER — Encounter: Payer: Self-pay | Admitting: Radiation Oncology

## 2018-06-01 ENCOUNTER — Other Ambulatory Visit: Payer: Self-pay | Admitting: *Deleted

## 2018-06-01 VITALS — BP 110/40 | HR 66 | Temp 98.1°F | Resp 18 | Ht 62.0 in | Wt 144.0 lb

## 2018-06-01 DIAGNOSIS — Z170 Estrogen receptor positive status [ER+]: Secondary | ICD-10-CM | POA: Insufficient documentation

## 2018-06-01 DIAGNOSIS — C50412 Malignant neoplasm of upper-outer quadrant of left female breast: Secondary | ICD-10-CM

## 2018-06-01 DIAGNOSIS — Z79899 Other long term (current) drug therapy: Secondary | ICD-10-CM | POA: Diagnosis not present

## 2018-06-01 DIAGNOSIS — Z9012 Acquired absence of left breast and nipple: Secondary | ICD-10-CM | POA: Diagnosis not present

## 2018-06-01 NOTE — Progress Notes (Addendum)
Radiation Oncology         [phone]) [phone] ________________________________  Name: Anne Horton MRN: [id number]  Date: 06/01/2018  DOB: 1975/12/18  Re-evaluation Note  CC: Osie Cheeks, PA-C  Nicholas Lose, MD    ICD-10-CM   1. Malignant neoplasm of upper-outer quadrant of left breast in female, estrogen receptor positive ([place]) C50.412    Z17.0     Diagnosis:   Stage I invasive ductal carcinoma of the left breast (pT1c, pN0)    Narrative:  The patient returns today for urther evaluation. The patient was initially seenin the multidisciplinary breast clinic on 04/09/2018. Since her original evaluation the patient has undergone her definitive surgery by Dr. Donne Hazel. The patient underwent a partial mastectomy and sentinel node procedure. On pathologic review the patient was found to have an invasive ductal carcinoma, grade 1 extending over 1.2 cm. There was associated ductal carcinoma in situ of intermediate grade. The surgical margins were clear with the closest margin in the posterior margin at 0.5 cm. Patient had 7 sentinel lymph nodes removed from the left axillary region none of which showed metastatic disease. Patient is been doing well since her surgery except for occasional problems with cording. She did develop a seroma which required drainage earlier.                         ALLERGIES:  is allergic to latex.  Meds: Current Outpatient Medications  Medication Sig Dispense Refill  . acetaminophen (TYLENOL) 500 MG tablet Take 500 mg by mouth every 6 (six) hours as needed.    . ALPRAZolam (XANAX) 0.5 MG tablet Take 0.5 mg by mouth at bedtime as needed (FOR ANXIETY). :take 1/2 to 1 twice a day as needed    . cetirizine (ZYRTEC) 10 MG tablet Take 10 mg by mouth as needed for allergies.     . DULoxetine (CYMBALTA) 60 MG capsule Take 60 mg by mouth daily.     Marland Kitchen ibuprofen (ADVIL,MOTRIN) 200 MG tablet Take 800 mg by mouth daily as needed for headache.    . metoprolol succinate  (TOPROL-XL) 50 MG 24 hr tablet TAKE 1 TABLET BY MOUTH DAILY. TAKE WITH OR IMMEDIATELY FOLLOWING A MEAL. 90 tablet 3  . oxyCODONE (OXY IR/ROXICODONE) 5 MG immediate release tablet Take 1 tablet (5 mg total) by mouth every 6 (six) hours as needed for moderate pain, severe pain or breakthrough pain. 10 tablet 0  . propranolol (INDERAL) 10 MG tablet TAKE 1 TABLET BY MOUTH FOUR TIMES DAILY AS NEEDED FOR PALPITATIONS 120 tablet 3   No current facility-administered medications for this encounter.     Physical Findings: The patient is in no acute distress. Patient is alert and oriented.  height is '5\' 2"'  (1.575 m) and weight is 144 lb (65.3 kg). Her oral temperature is 98.1 F (36.7 C). Her blood pressure is 110/40 (abnormal) and her pulse is 66. Her respiration is 18 and oxygen saturation is 100%. .  Lungs are clear to auscultation bilaterally. Heart has regular rate and rhythm. No palpable cervical, supraclavicular, or axillary adenopathy. Abdomen soft, non-tender, normal bowel sounds. The right breast reveals no palpable mass nipple discharge or bleeding. The left breast area shows a lumpectomy scar in the upper outer quadrant which is healing well without signs of drainage or infection. The patient has a separate scar in the left axillary region which is also healing well without signs of drainage or infection.  Lab Findings: Lab Results  Component Value Date   WBC 7.8 03/24/2018   HGB 10.1 (L) 03/24/2018   HCT 32.2 (L) 03/24/2018   MCV 77.8 (L) 03/24/2018   PLT 424 (H) 03/24/2018    Radiographic Findings: Nm Sentinel Node Inj-no Rpt (breast)  Result Date: 05/07/2018 Sulfur colloid was injected by the nuclear medicine technologist for melanoma sentinel node.   Mm Breast Surgical Specimen  Result Date: 05/07/2018 CLINICAL DATA:  Specimen radiograph status post left breast lumpectomy. EXAM: SPECIMEN RADIOGRAPH OF THE LEFT BREAST COMPARISON:  Previous exam(s). FINDINGS: Status post excision of  the left breast. The radioactive seed and biopsy marker clip are present, completely intact, and were marked for pathology. These findings were communicated with the OR at 2:38 p.m. IMPRESSION: Specimen radiograph of the left breast. Electronically Signed   By: Ammie Ferrier M.D.   On: 05/07/2018 14:39   Mm Lt Radioactive Seed Loc Mammo Guide  Result Date: 05/06/2018 CLINICAL DATA:  Biopsy proven invasive ductal carcinoma and ductal carcinoma in-situ in the left breast. EXAM: MAMMOGRAPHIC GUIDED RADIOACTIVE SEED LOCALIZATION OF THE LEFT BREAST COMPARISON:  Previous exam(s). FINDINGS: Patient presents for radioactive seed localization prior to surgery. I met with the patient and we discussed the procedure of seed localization including benefits and alternatives. We discussed the high likelihood of a successful procedure. We discussed the risks of the procedure including infection, bleeding, tissue injury and further surgery. We discussed the low dose of radioactivity involved in the procedure. Informed, written consent was given. The usual time-out protocol was performed immediately prior to the procedure. Using mammographic guidance, sterile technique, 1% lidocaine and an I-125 radioactive seed, the ribbon shaped clip was localized using a a lateral to medial approach. The follow-up mammogram images confirm the seed in the expected location and were marked for Dr. Donne Hazel. Follow-up survey of the patient confirms presence of the radioactive seed. Order number of I-125 seed:  [id number]. Total activity:  4.098 millicuries reference Date: 04/14/2018 The patient tolerated the procedure well and was released from the [place]. She was given instructions regarding seed removal. IMPRESSION: Radioactive seed localization left breast. No apparent complications. Electronically Signed   By: Lillia Mountain M.D.   On: 05/06/2018 14:27    Impression:  Stage I invasive ductal carcinoma of the left breast. The  patient will not require adjuvant chemotherapy and is now ready to proceed with adjuvant radiation therapy as part of her overall management.  Today, I talked to the patient  about the findings and work-up thus far.  We discussed the natural history of invasive ductal breast cancer and general treatment, highlighting the role of radiotherapy in the management.  We discussed the available radiation techniques, and focused on the details of logistics and delivery.  We reviewed the anticipated acute and late sequelae associated with radiation in this setting.  The patient was encouraged to ask questions that I answered to the best of my ability.  A patient consent form was discussed and signed.  We retained a copy for our records.  The patient would like to proceed with radiation and will be scheduled for CT simulation.  Plan:  The patient will be scheduled for CT simulation approximately 5 weeks out from her surgery with treatments to begin approximately 6 weeks postop. The patient does have a trip scheduled to [facility] and we will hold off on starting her radiation treatment until she has completed this trip.  ____________________________________ [name], MD

## 2018-06-02 ENCOUNTER — Ambulatory Visit: Payer: 59 | Admitting: Radiation Oncology

## 2018-06-09 ENCOUNTER — Ambulatory Visit
Admission: RE | Admit: 2018-06-09 | Discharge: 2018-06-09 | Disposition: A | Discharge status: Home / Self Care | Payer: 59 | Source: Ambulatory Visit | Attending: Radiation Oncology | Admitting: Radiation Oncology

## 2018-06-09 DIAGNOSIS — Z170 Estrogen receptor positive status [ER+]: Secondary | ICD-10-CM | POA: Diagnosis not present

## 2018-06-09 DIAGNOSIS — C50412 Malignant neoplasm of upper-outer quadrant of left female breast: Secondary | ICD-10-CM | POA: Insufficient documentation

## 2018-06-09 DIAGNOSIS — Z510 Encounter for antineoplastic radiation therapy: Secondary | ICD-10-CM | POA: Insufficient documentation

## 2018-06-10 NOTE — Progress Notes (Signed)
  Radiation Oncology         [phone] ________________________________  Name: Neaveh Belanger [facility] MRN: [id number]  Date: 06/09/2018  DOB: 09-27-1975  SIMULATION AND TREATMENT PLANNING NOTE    ICD-10-CM   1. Malignant neoplasm of upper-outer quadrant of left breast in female, estrogen receptor positive ([place]) C50.412    Z17.0     DIAGNOSIS:  Stage I invasive ductal carcinoma of the left breast (pT1c, pN0)  NARRATIVE:  The patient was brought to the [facility].  Identity was confirmed.  All relevant records and images related to the planned course of therapy were reviewed.  The patient freely provided informed written consent to proceed with treatment after reviewing the details related to the planned course of therapy. The consent form was witnessed and verified by the simulation staff.  Then, the patient was set-up in a stable reproducible  supine position for radiation therapy.  CT images were obtained.  Surface markings were placed.  The CT images were loaded into the planning software.  Then the target and avoidance structures were contoured.  Treatment planning then occurred.  The radiation prescription was entered and confirmed.  Then, I designed and supervised the construction of a total of 3 medically necessary complex treatment devices.  I have requested : 3D Simulation  I have requested a DVH of the following structures: lumpectomy cavity, heart, lungs.  I have ordered:CBC  PLAN:  The patient will receive 50.4 Gy in 28 fractions followed by a boost to the lumpectomy cavity of 10 gray in 5 fractions for a cumulative dose of 60.4 Gy.    Optical Surface Tracking Plan:  Since intensity modulated radiotherapy (IMRT) and 3D conformal radiation treatment methods are predicated on accurate and precise positioning for treatment, intrafraction motion monitoring is medically necessary to ensure accurate and safe treatment delivery.  The ability to quantify intrafraction motion  without excessive ionizing radiation dose can only be performed with optical surface tracking. Accordingly, surface imaging offers the opportunity to obtain 3D measurements of patient position throughout IMRT and 3D treatments without excessive radiation exposure.  I am ordering optical surface tracking for this patient's upcoming course of radiotherapy. ________________________________  Special treatment procedure:  was performed today due to the extra time and effort required by myself to plan and prepare this patient for deep inspiration breath hold technique.  I have determined cardiac sparing to be of benefit to this patient to prevent long term cardiac damage due to radiation of the heart.  Bellows were placed on the patient's abdomen. To facilitate cardiac sparing, the patient was coached by the radiation therapists on breath hold techniques and breathing practice was performed. Practice waveforms were obtained. The patient was then scanned while maintaining breath hold in the treatment position.  This image was then transferred over to the imaging specialist. The imaging specialist then created a fusion of the free breathing and breath hold scans using the chest wall as the stable structure. I personally reviewed the fusion in axial, coronal and sagittal image planes.  Excellent cardiac sparing was obtained.  I felt the patient is an appropriate candidate for breath hold and the patient will be treated as such.  The image fusion was then reviewed with the patient to reinforce the necessity of reproducible breath hold.   -----------------------------------  Blair Promise, PhD, MD

## 2018-06-11 ENCOUNTER — Telehealth: Payer: Self-pay | Admitting: Hematology and Oncology

## 2018-06-11 NOTE — Telephone Encounter (Signed)
Scheduled appt per 9/25 sch message - sent reminder letter in the mail with appt date and time - appt immediately after rad onc appt./

## 2018-06-15 ENCOUNTER — Other Ambulatory Visit: Payer: Self-pay

## 2018-06-15 ENCOUNTER — Ambulatory Visit: Payer: 59 | Attending: Hematology and Oncology | Admitting: Rehabilitation

## 2018-06-15 ENCOUNTER — Encounter: Payer: Self-pay | Admitting: Rehabilitation

## 2018-06-15 DIAGNOSIS — M25512 Pain in left shoulder: Secondary | ICD-10-CM

## 2018-06-15 DIAGNOSIS — M25612 Stiffness of left shoulder, not elsewhere classified: Secondary | ICD-10-CM

## 2018-06-15 DIAGNOSIS — Z483 Aftercare following surgery for neoplasm: Secondary | ICD-10-CM | POA: Diagnosis not present

## 2018-06-15 NOTE — Patient Instructions (Signed)
Given medbridge copy of open book stretch, supine flexion AAROM, doorway stretch, and behind the head chest stretch

## 2018-06-15 NOTE — Therapy (Signed)
[place], [place], [address] Phone: [phone]   Fax:  [phone]  Physical Therapy Evaluation  Patient Details  Name: Anne Horton MRN: [id number] Date of Birth: 11-02-1975 Referring Provider (PT): Dr. Lindi Adie   Encounter Date: 06/15/2018  PT End of Session - 06/15/18 1540    Visit Number  1    Number of Visits  12    Date for PT Re-Evaluation  07/13/18    PT Start Time  0930    PT Stop Time  1015    PT Time Calculation (min)  45 min    Activity Tolerance  Patient tolerated treatment well    Behavior During Therapy  [facility] for tasks assessed/performed       Past Medical History:  Diagnosis Date  . Allergy   . Anemia   . Anxiety   . Cancer ([place])   . Chest pain   . Depression   . Family history of breast cancer   . Fibromyalgia    neurofibromatosis  . H/O: depression   . Hx of migraines   . Mitral regurgitation    mild per echo EF 55-60%  . Neurofibromatosis ([place])   . Palpitations   . Tachycardia    Hx. of  . Wears contact lenses     Past Surgical History:  Procedure Laterality Date  . BREAST LUMPECTOMY WITH RADIOACTIVE SEED AND SENTINEL LYMPH NODE BIOPSY Left 05/07/2018   Procedure: BREAST LUMPECTOMY WITH RADIOACTIVE SEED AND SENTINEL LYMPH NODE BIOPSY;  Surgeon: Rolm Bookbinder, MD;  Location: [facility];  Service: General;  Laterality: Left;  . CESAREAN SECTION    . CHOLECYSTECTOMY N/A 01/04/2015   Procedure: LAPAROSCOPIC CHOLECYSTECTOMY WITH INTRAOPERATIVE CHOLANGIOGRAM;  Surgeon: Erroll Luna, MD;  Location: [facility];  Service: General;  Laterality: N/A;  . COLONOSCOPY    . ESOPHAGOGASTRODUODENOSCOPY    . WISDOM TOOTH EXTRACTION      There were no vitals filed for this visit.   Subjective Assessment - 06/15/18 0929    Subjective  I'm having some problems in the arm with cording and pain in the shoulder and under arm     Pertinent  History  05/07/18 Left lumpectomy: IDC grade 1, 1.2 cm, intermediate grade DCIS, margins negative,PASH, 0/7 sentinel lymph nodes negative, ER 95%, PR 95%, Ki-67 1%, HER-2 negative ratio 1.47, T1CN0 stage Ia, no chemo needed, will begin radiation next week, firomyalgia, depression    Limitations  Lifting    Patient Stated Goals  back to normal with the shoulder, get rid of the cording      Currently in Pain?  Yes    Pain Score  4     Pain Location  Axilla    Pain Orientation  Left    Pain Descriptors / Indicators  Aching;Tingling    Pain Type  Surgical pain    Pain Radiating Towards  the elbow     Pain Onset  1 to 4 weeks ago    Aggravating Factors   none    Pain Relieving Factors  heat     Effect of Pain on Daily Activities  none          [facility] PT Assessment - 06/15/18 0001      Assessment   Medical Diagnosis  Lt lumpectomy    Referring Provider (PT)  Dr. Lindi Adie    Onset Date/Surgical Date  05/07/18    Hand Dominance  Right    Next  MD Visit  next week    Prior Therapy  no      Precautions   Precaution Comments  cancer, lymphedema      Restrictions   Weight Bearing Restrictions  No      Balance Screen   Has the patient fallen in the past 6 months  No    Has the patient had a decrease in activity level because of a fear of falling?   No    Is the patient reluctant to leave their home because of a fear of falling?   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      Skin Integrity  has a [place]  compression bra;       Posture/Postural Control   Posture/Postural Control  Postural limitations    Postural Limitations  Rounded Shoulders;Forward head      ROM / Strength   AROM / PROM / Strength  AROM;PROM      AROM   Overall AROM Comments  no cording evident today but pt reports that they willusually be in the Lt upper arm and into the lateral breast when there.      AROM Assessment Site  Shoulder    Right/Left Shoulder  Right;Left    Right Shoulder Extension  40 Degrees    Right Shoulder Flexion  150 Degrees    Right Shoulder ABduction  170 Degrees    Right Shoulder Internal Rotation  90 Degrees    Right Shoulder External Rotation  90 Degrees    Left Shoulder Extension  40 Degrees    Left Shoulder Flexion  140 Degrees    Left Shoulder ABduction  165 Degrees    Left Shoulder Internal Rotation  70 Degrees    Left Shoulder External Rotation  80 Degrees      PROM   Overall PROM Comments  pull felt in flexion to 150 and behind the head chest stretch         LYMPHEDEMA/ONCOLOGY QUESTIONNAIRE - 06/15/18 0946      Type   Cancer Type  Lt breast cancer      Surgeries   Lumpectomy Date  05/07/18    Sentinel Lymph Node Biopsy Date  05/07/18    Number Lymph Nodes Removed  7   all negative     Treatment   Active Chemotherapy Treatment  No    Past Chemotherapy Treatment  No    Active Radiation Treatment  No    Past Radiation Treatment  No    Current Hormone Treatment  No    Past Hormone Therapy  No      What other symptoms do you have   Are you Having Heaviness or Tightness  No    Are you having Pain  Yes      Lymphedema Assessments   Lymphedema Assessments  Upper extremities      Right Upper  Extremity Lymphedema   10 cm Proximal to Olecranon Process  26.5 cm    Olecranon Process  24 cm    10 cm Proximal to Ulnar Styloid Process  20.3 cm    Just Proximal to Ulnar Styloid Process  16.3 cm    Across Hand at [employer]  18.2 cm    At [place] of 2nd Digit  6.5 cm       Left Upper Extremity Lymphedema   10 cm Proximal to Olecranon Process  26.5 cm    Olecranon Process  24 cm    10 cm Proximal to Ulnar Styloid Process  20.5 cm    Just Proximal to Ulnar Styloid Process  16 cm    Across Hand at [employer]  18.5 cm    At [place] of 2nd Digit  6.5 cm          Quick Dash - 06/15/18 0001    Open a tight or new jar  Mild difficulty    Do heavy household chores (wash walls, wash floors)  Mild difficulty    Carry a shopping bag or briefcase  No difficulty    Wash your back  Mild difficulty    Use a knife to cut food  No difficulty    Recreational activities in which you take some force or impact through your arm, shoulder, or hand (golf, hammering, tennis)  Unable    During the past week, to what extent has your arm, shoulder or hand problem interfered with your normal social activities with family, friends, neighbors, or groups?  Modererately    During the past week, to what extent has your arm, shoulder or hand problem limited your work or other regular daily activities  Slightly    Arm, shoulder, or hand pain.  Moderate    Tingling (pins and needles) in your arm, shoulder, or hand  Severe    Difficulty Sleeping  Moderate difficulty    DASH Score  38.64 %        Objective measurements completed on examination: See above findings.      [facility] Adult PT Treatment/Exercise - 06/15/18 0001      Manual Therapy   Manual Therapy  Edema management    Edema Management  pt has jobst bella strong 20-30mhg for flying tomorrow              PT Education - 06/15/18 1539    Education Details  what is lymphedema, precautions, initial HEP    Person(s) Educated  Patient    Methods  Explanation    Comprehension  Verbalized understanding;Returned demonstration;Verbal cues required          PT Long Term Goals - 06/15/18 1546      PT LONG TERM GOAL #1   Title  Pt will decrease QDASH to at least 10 points to demonstrate a MCID    Time  4     Period  Weeks    Status  New    Target Date  07/13/18      PT LONG TERM GOAL #2   Title  Pt will improve Lt shoulder AROM to flex; 150 and Abd: 170 to match the unaffected UE    Time  4    Period  Weeks    Status  New    Target Date  07/13/18      PT LONG TERM GOAL #3   Title  Pt will be ind with final HEP  Time  4    Period  Weeks    Status  New    Target Date  07/13/18      PT LONG TERM GOAL #4   Title  Pt will be aware of lymphedema signs and symptoms as well as precautions     Time  4    Period  Weeks    Status  New    Target Date  07/13/18             Plan - 06/15/18 1540    Clinical Impression Statement  [name] presents with symptoms consistent with cording in the Lt UE with decreased ROM and pulling in to the axilla and Lt breast tissue.  She reports she can intermittently see cords in the axilla but they are not present today.  no signs of lymphedema but pt already has a bella strong 20-40m for flying tomorrow.  She was advised to wear it for a while before using it onthe plane.  Her ROM is slightly limited on the Lt.  no signs of breast lymphedema.      History and Personal Factors relevant to plan of care:  05/07/18 Left lumpectomy: IDC grade 1, 1.2 cm, intermediate grade DCIS, margins negative,PASH, 0/7 sentinel lymph nodes negative, ER 95%, PR 95%, Ki-67 1%, HER-2 negative ratio 1.47, T1CN0 stage Ia, no chemo needed, will begin radiation next week, firomyalgia, depression    Clinical Presentation  Evolving    Clinical Presentation due to:  post op status    Clinical Decision Making  Moderate    Rehab Potential  Excellent    PT Frequency  2x / week    PT Duration  3 weeks    PT Treatment/Interventions  Therapeutic exercise;Patient/family education;Manual techniques;Taping;Passive range of motion    PT Next Visit Plan  check Lt shoulder ROM after trip, begin manual work to the Lt shoulder to improve ROM and cording if evident / pull into the breast with  shoulder ROM, transition to shoulder ROM and strength for HEP     PT Home Exercise Plan  Given medbridge copy of open book stretch, supine flexion AAROM, doorway stretch, and behind the head chest stretch (no code)    Consulted and Agree with Plan of Care  Patient       Patient will benefit from skilled therapeutic intervention in order to improve the following deficits and impairments:  Decreased knowledge of precautions, Decreased range of motion, Impaired UE functional use, Pain, Decreased mobility  Visit Diagnosis: Aftercare following surgery for neoplasm  Stiffness of left shoulder, not elsewhere classified  Acute pain of left shoulder     Problem List Patient Active Problem List   Diagnosis Date Noted  . Genetic testing 04/16/2018  . Malignant neoplasm of upper-outer quadrant of left breast in female, estrogen receptor positive ([place] 03/25/2018  . Family history of breast cancer 03/24/2018  . Anemia, iron deficiency 10/20/2014  . Chest pain   . Palpitations   . Neurofibromatosis ([place]   . Tachycardia   . Hx of migraines   . H/O: depression   . Mitral regurgitation   . BLOOD IN STOOL 01/12/2009  . [place] 01/12/2009    KShan Levans PT 06/15/2018, 3:54 PM  [place] [address]Phone: [phone]  Fax:  [phone] Name: JABBIE BERLINGMRN: [id number] of Birth: 102-22-1977

## 2018-06-17 DIAGNOSIS — Z510 Encounter for antineoplastic radiation therapy: Secondary | ICD-10-CM | POA: Insufficient documentation

## 2018-06-17 DIAGNOSIS — C50412 Malignant neoplasm of upper-outer quadrant of left female breast: Secondary | ICD-10-CM | POA: Insufficient documentation

## 2018-06-17 DIAGNOSIS — Z170 Estrogen receptor positive status [ER+]: Secondary | ICD-10-CM | POA: Diagnosis not present

## 2018-06-22 ENCOUNTER — Ambulatory Visit: Payer: 59 | Attending: Hematology and Oncology

## 2018-06-22 ENCOUNTER — Ambulatory Visit
Admission: RE | Admit: 2018-06-22 | Discharge: 2018-06-22 | Disposition: A | Discharge status: Home / Self Care | Payer: 59 | Source: Ambulatory Visit | Attending: Radiation Oncology | Admitting: Radiation Oncology

## 2018-06-22 DIAGNOSIS — Z170 Estrogen receptor positive status [ER+]: Secondary | ICD-10-CM | POA: Diagnosis not present

## 2018-06-22 DIAGNOSIS — Z510 Encounter for antineoplastic radiation therapy: Secondary | ICD-10-CM | POA: Diagnosis not present

## 2018-06-22 DIAGNOSIS — M25612 Stiffness of left shoulder, not elsewhere classified: Secondary | ICD-10-CM | POA: Diagnosis not present

## 2018-06-22 DIAGNOSIS — M25512 Pain in left shoulder: Secondary | ICD-10-CM | POA: Diagnosis not present

## 2018-06-22 DIAGNOSIS — Z483 Aftercare following surgery for neoplasm: Secondary | ICD-10-CM | POA: Diagnosis not present

## 2018-06-22 DIAGNOSIS — C50412 Malignant neoplasm of upper-outer quadrant of left female breast: Secondary | ICD-10-CM | POA: Diagnosis not present

## 2018-06-22 NOTE — Therapy (Signed)
[place], [place], [address] Phone: [phone]   Fax:  [phone]  Physical Therapy Treatment  Patient Details  Name: Anne Horton MRN: [id number] Date of Birth: 04/26/1976 Referring Provider (PT): Dr. Lindi Adie   Encounter Date: 06/22/2018  PT End of Session - 06/22/18 0843    Visit Number  2    Number of Visits  12    Date for PT Re-Evaluation  07/13/18    PT Start Time  0803    PT Stop Time  0848    PT Time Calculation (min)  45 min    Activity Tolerance  Patient tolerated treatment well    Behavior During Therapy  [facility] for tasks assessed/performed       Past Medical History:  Diagnosis Date  . Allergy   . Anemia   . Anxiety   . Cancer ([place])   . Chest pain   . Depression   . Family history of breast cancer   . Fibromyalgia    neurofibromatosis  . H/O: depression   . Hx of migraines   . Mitral regurgitation    mild per echo EF 55-60%  . Neurofibromatosis ([place])   . Palpitations   . Tachycardia    Hx. of  . Wears contact lenses     Past Surgical History:  Procedure Laterality Date  . BREAST LUMPECTOMY WITH RADIOACTIVE SEED AND SENTINEL LYMPH NODE BIOPSY Left 05/07/2018   Procedure: BREAST LUMPECTOMY WITH RADIOACTIVE SEED AND SENTINEL LYMPH NODE BIOPSY;  Surgeon: Rolm Bookbinder, MD;  Location: [place];  Service: General;  Laterality: Left;  . CESAREAN SECTION    . CHOLECYSTECTOMY N/A 01/04/2015   Procedure: LAPAROSCOPIC CHOLECYSTECTOMY WITH INTRAOPERATIVE CHOLANGIOGRAM;  Surgeon: Erroll Luna, MD;  Location: [place];  Service: General;  Laterality: N/A;  . COLONOSCOPY    . ESOPHAGOGASTRODUODENOSCOPY    . WISDOM TOOTH EXTRACTION      There were no vitals filed for this visit.  Subjective Assessment - 06/22/18 0807    Subjective  Trip went well, I wore my sleeve but I could tell my arm swelled on the way there but it was fine on the way  back. I think my ROM is getting better. Doing the exercises about every other day and using it normally.  The cording seems some improved but is still intermittent. It's not as visible now either.     Pertinent History  05/07/18 Left lumpectomy: IDC grade 1, 1.2 cm, intermediate grade DCIS, margins negative,PASH, 0/7 sentinel lymph nodes negative, ER 95%, PR 95%, Ki-67 1%, HER-2 negative ratio 1.47, T1CN0 stage Ia, no chemo needed, will begin radiation next week, firomyalgia, depression    Patient Stated Goals  back to normal with the shoulder, get rid of the cording      Currently in Pain?  No/denies         [facility] PT Assessment - 06/22/18 0001      AROM   Left Shoulder Extension  65 Degrees    Left Shoulder Flexion  150 Degrees    Left Shoulder ABduction  172 Degrees                   [facility] Adult PT Treatment/Exercise - 06/22/18 0001      Shoulder Exercises: Pulleys   Flexion  2 minutes    Flexion Limitations  Demonstration and VCs for correct technique and to decrease Lt scapular compensation    ABduction  2 minutes      Shoulder Exercises: Therapy Ball   Flexion  Both;10 reps   Forward lean into end of stretch   ABduction  Left;5 reps   Same side lean into end of stretch     Manual Therapy   Manual Therapy  Myofascial release;Passive ROM    Myofascial Release  To Lt axilla where tpt reports coding though not evident today, also UE pulling thorughout P/ROM    Passive ROM  In Supine to Lt shoulder into flexion, abduction and D2 to tolerance                  PT Long Term Goals - 06/15/18 1546      PT LONG TERM GOAL #1   Title  Pt will decrease QDASH to at least 10 points to demonstrate a MCID    Time  4    Period  Weeks    Status  New    Target Date  07/13/18      PT LONG TERM GOAL #2   Title  Pt will improve Lt shoulder AROM to flex; 150 and Abd: 170 to match the unaffected UE    Time  4    Period  Weeks    Status  New    Target Date  07/13/18       PT LONG TERM GOAL #3   Title  Pt will be ind with final HEP    Time  4    Period  Weeks    Status  New    Target Date  07/13/18      PT LONG TERM GOAL #4   Title  Pt will be aware of lymphedema signs and symptoms as well as precautions     Time  4    Period  Weeks    Status  New    Target Date  07/13/18            Plan - 06/22/18 0845    Clinical Impression Statement  Pt tolerated first session of P/ROM and manual therapy well, as well as AA/ROM. She reports feeling good stretches with all activities today and shoulder felt looser by end of session. Also cording was minimally palpable during stretching today, but was even less by end.    Rehab Potential  Excellent    PT Frequency  2x / week    PT Duration  3 weeks    PT Treatment/Interventions  Therapeutic exercise;Patient/family education;Manual techniques;Taping;Passive range of motion    PT Next Visit Plan  Cont manual work to the Lt shoulder to improve ROM, begin shoulder ROM and strength for HEP     Consulted and Agree with Plan of Care  Patient       Patient will benefit from skilled therapeutic intervention in order to improve the following deficits and impairments:  Decreased knowledge of precautions, Decreased range of motion, Impaired UE functional use, Pain, Decreased mobility  Visit Diagnosis: Aftercare following surgery for neoplasm  Stiffness of left shoulder, not elsewhere classified  Acute pain of left shoulder     Problem List Patient Active Problem List   Diagnosis Date Noted  . Genetic testing 04/16/2018  . Malignant neoplasm of upper-outer quadrant of left breast in female, estrogen receptor positive ([place]) 03/25/2018  . Family history of breast cancer 03/24/2018  . Anemia, iron deficiency 10/20/2014  . Chest pain   . Palpitations   . Neurofibromatosis ([place])   . Tachycardia   . Hx  of migraines   . H/O: depression   . Mitral regurgitation   . BLOOD IN STOOL 01/12/2009  . BLOOD IN  STOOL, OCCULT 01/12/2009    Otelia Limes, PTA 06/22/2018, 8:54 AM  [place], [place], [address] Phone: [phone]   Fax:  [phone]  Name: Anne Horton MRN: [id number] Date of Birth: 1976/08/15

## 2018-06-23 ENCOUNTER — Ambulatory Visit: Payer: 59

## 2018-06-24 ENCOUNTER — Ambulatory Visit
Admission: RE | Admit: 2018-06-24 | Discharge: 2018-06-24 | Disposition: A | Discharge status: Home / Self Care | Payer: 59 | Source: Ambulatory Visit | Attending: Radiation Oncology | Admitting: Radiation Oncology

## 2018-06-24 DIAGNOSIS — Z170 Estrogen receptor positive status [ER+]: Principal | ICD-10-CM

## 2018-06-24 DIAGNOSIS — Z510 Encounter for antineoplastic radiation therapy: Secondary | ICD-10-CM | POA: Diagnosis not present

## 2018-06-24 DIAGNOSIS — C50412 Malignant neoplasm of upper-outer quadrant of left female breast: Secondary | ICD-10-CM | POA: Diagnosis not present

## 2018-06-24 MED ORDER — ALRA NON-METALLIC DEODORANT (RAD-ONC)
1.0000 "application " | Freq: Once | TOPICAL | Status: AC
  Administered 2018-06-24: 1 via TOPICAL

## 2018-06-24 MED ORDER — RADIAPLEXRX EX GEL
Freq: Once | CUTANEOUS | Status: AC
  Administered 2018-06-24: 15:00:00 via TOPICAL

## 2018-06-24 MED FILL — METOPROLOL SUCCINATE ER 50: 50 | 90 days supply | Qty: 90 | Fill #1

## 2018-06-24 MED FILL — DULoxetine HCL 60 MG CPEP: 60 | 30 days supply | Qty: 60 | Fill #1

## 2018-06-24 NOTE — Progress Notes (Signed)
Pt here for patient teaching.  Pt given Radiation and You booklet, skin care instructions, Alra deodorant and Radiaplex gel.  Reviewed areas of pertinence such as fatigue, skin changes, breast tenderness and breast swelling . Pt able to give teach back of to pat skin and use unscented/gentle soap,apply Radiaplex bid, avoid applying anything to skin within 4 hours of treatment, avoid wearing an under wire bra and to use an electric razor if they must shave. Pt demonstrated understanding and verbalizes understanding of information given and will contact nursing with any questions or concerns.     Http://rtanswers.org/treatmentinformation/whattoexpect/index  Jill W Pfefferkorn, RN BSN       

## 2018-06-25 ENCOUNTER — Ambulatory Visit
Admission: RE | Admit: 2018-06-25 | Discharge: 2018-06-25 | Disposition: A | Discharge status: Home / Self Care | Payer: 59 | Source: Ambulatory Visit | Attending: Radiation Oncology | Admitting: Radiation Oncology

## 2018-06-25 ENCOUNTER — Ambulatory Visit: Payer: 59 | Admitting: Physical Therapy

## 2018-06-25 ENCOUNTER — Other Ambulatory Visit: Payer: Self-pay

## 2018-06-25 ENCOUNTER — Encounter: Payer: Self-pay | Admitting: Physical Therapy

## 2018-06-25 DIAGNOSIS — Z170 Estrogen receptor positive status [ER+]: Secondary | ICD-10-CM | POA: Diagnosis not present

## 2018-06-25 DIAGNOSIS — Z510 Encounter for antineoplastic radiation therapy: Secondary | ICD-10-CM | POA: Diagnosis not present

## 2018-06-25 DIAGNOSIS — Z483 Aftercare following surgery for neoplasm: Secondary | ICD-10-CM

## 2018-06-25 DIAGNOSIS — C50412 Malignant neoplasm of upper-outer quadrant of left female breast: Secondary | ICD-10-CM | POA: Diagnosis not present

## 2018-06-25 DIAGNOSIS — M25612 Stiffness of left shoulder, not elsewhere classified: Secondary | ICD-10-CM | POA: Diagnosis not present

## 2018-06-25 DIAGNOSIS — M25512 Pain in left shoulder: Secondary | ICD-10-CM | POA: Diagnosis not present

## 2018-06-25 NOTE — Therapy (Signed)
[place], [place], [address] Phone: [phone]   Fax:  [phone]  Physical Therapy Treatment  Patient Details  Name: Anne Horton MRN: [id number] Date of Birth: 07/09/1976 Referring Provider (PT): Dr. Lindi Adie   Encounter Date: 06/25/2018  PT End of Session - 06/25/18 1659    Visit Number  3    Number of Visits  12    Date for PT Re-Evaluation  07/13/18    PT Start Time  1603    PT Stop Time  1645    PT Time Calculation (min)  42 min    Activity Tolerance  Patient tolerated treatment well    Behavior During Therapy  [facility] for tasks assessed/performed       Past Medical History:  Diagnosis Date  . Allergy   . Anemia   . Anxiety   . Cancer ([place])   . Chest pain   . Depression   . Family history of breast cancer   . Fibromyalgia    neurofibromatosis  . H/O: depression   . Hx of migraines   . Mitral regurgitation    mild per echo EF 55-60%  . Neurofibromatosis ([place])   . Palpitations   . Tachycardia    Hx. of  . Wears contact lenses     Past Surgical History:  Procedure Laterality Date  . BREAST LUMPECTOMY WITH RADIOACTIVE SEED AND SENTINEL LYMPH NODE BIOPSY Left 05/07/2018   Procedure: BREAST LUMPECTOMY WITH RADIOACTIVE SEED AND SENTINEL LYMPH NODE BIOPSY;  Surgeon: Rolm Bookbinder, MD;  Location: [place];  Service: General;  Laterality: Left;  . CESAREAN SECTION    . CHOLECYSTECTOMY N/A 01/04/2015   Procedure: LAPAROSCOPIC CHOLECYSTECTOMY WITH INTRAOPERATIVE CHOLANGIOGRAM;  Surgeon: Erroll Luna, MD;  Location: [place];  Service: General;  Laterality: N/A;  . COLONOSCOPY    . ESOPHAGOGASTRODUODENOSCOPY    . WISDOM TOOTH EXTRACTION      There were no vitals filed for this visit.  Subjective Assessment - 06/25/18 1604    Subjective  My cording hurt a little this morning but it is not too bad right now.     Pertinent History  05/07/18 Left  lumpectomy: IDC grade 1, 1.2 cm, intermediate grade DCIS, margins negative,PASH, 0/7 sentinel lymph nodes negative, ER 95%, PR 95%, Ki-67 1%, HER-2 negative ratio 1.47, T1CN0 stage Ia, no chemo needed, will begin radiation next week, firomyalgia, depression    Patient Stated Goals  back to normal with the shoulder, get rid of the cording      Currently in Pain?  No/denies    Pain Score  0-No pain         [facility] PT Assessment - 06/25/18 0001      AROM   Left Shoulder Flexion  160 Degrees    Left Shoulder ABduction  167 Degrees                   [facility] Adult PT Treatment/Exercise - 06/25/18 0001      Exercises   Exercises  Shoulder      Shoulder Exercises: Supine   Horizontal ABduction  Strengthening;Both;10 reps    Theraband Level (Shoulder Horizontal ABduction)  Level 2 (Red)    External Rotation  Strengthening;Both;10 reps    Theraband Level (Shoulder External Rotation)  Level 2 (Red)    Flexion  Strengthening;Both;10 reps   narrow and wide grip   Theraband Level (Shoulder Flexion)  Level 2 (Red)  Diagonals  Strengthening;Both;10 reps    Theraband Level (Shoulder Diagonals)  Level 2 (Red)      Shoulder Exercises: Pulleys   Flexion  2 minutes    ABduction  2 minutes      Shoulder Exercises: Therapy Ball   Flexion  Both;10 reps   Forward lean into end of stretch   ABduction  10 reps;Left      Manual Therapy   Manual Therapy  Myofascial release;Passive ROM    Myofascial Release  To Lt axilla where tpt reports coding though not evident today, also UE pulling thorughout P/ROM    Passive ROM  In Supine to Lt shoulder into flexion, abduction and D2 to tolerance                  PT Long Term Goals - 06/15/18 1546      PT LONG TERM GOAL #1   Title  Pt will decrease QDASH to at least 10 points to demonstrate a MCID    Time  4    Period  Weeks    Status  New    Target Date  07/13/18      PT LONG TERM GOAL #2   Title  Pt will improve Lt shoulder  AROM to flex; 150 and Abd: 170 to match the unaffected UE    Time  4    Period  Weeks    Status  New    Target Date  07/13/18      PT LONG TERM GOAL #3   Title  Pt will be ind with final HEP    Time  4    Period  Weeks    Status  New    Target Date  07/13/18      PT LONG TERM GOAL #4   Title  Pt will be aware of lymphedema signs and symptoms as well as precautions     Time  4    Period  Weeks    Status  New    Target Date  07/13/18            Plan - 06/25/18 1635    Clinical Impression Statement  Pt demonstrates a 10 degree increase in left shoulder flexion today. She reports she has been doing her exercises and her cording comes and goes. She has been having some shoulder pain today. Instructed pt in supine scapular exercises today for continued stretching and strengthening and issued these to pt as part of her HEP.     Rehab Potential  Excellent    PT Frequency  2x / week    PT Duration  3 weeks    PT Treatment/Interventions  Therapeutic exercise;Patient/family education;Manual techniques;Taping;Passive range of motion    PT Next Visit Plan  Cont manual work to the Lt shoulder to improve ROM, assess indep with supine scap, Strength ABC?     PT Home Exercise Plan  Given medbridge copy of open book stretch, supine flexion AAROM, doorway stretch, and behind the head chest stretch (no code)    Consulted and Agree with Plan of Care  Patient       Patient will benefit from skilled therapeutic intervention in order to improve the following deficits and impairments:  Decreased knowledge of precautions, Decreased range of motion, Impaired UE functional use, Pain, Decreased mobility  Visit Diagnosis: Aftercare following surgery for neoplasm  Stiffness of left shoulder, not elsewhere classified  Acute pain of left shoulder     Problem List Patient  Active Problem List   Diagnosis Date Noted  . Genetic testing 04/16/2018  . Malignant neoplasm of upper-outer quadrant of  left breast in female, estrogen receptor positive ([place]) 03/25/2018  . Family history of breast cancer 03/24/2018  . Anemia, iron deficiency 10/20/2014  . Chest pain   . Palpitations   . Neurofibromatosis ([place])   . Tachycardia   . Hx of migraines   . H/O: depression   . Mitral regurgitation   . BLOOD IN STOOL 01/12/2009  . BLOOD IN [place], OCCULT 01/12/2009    Allyson Sabal [facility] 06/25/2018, 4:59 PM  [place], [place], [address] Phone: [phone]   Fax:  [phone]  Name: Anne Horton MRN: [id number] Date of Birth: 09/25/1975  Manus Gunning, PT 06/25/18 5:00 PM

## 2018-06-25 NOTE — Patient Instructions (Signed)
Over Head Pull: Narrow and Wide Grip   Cancer Rehab [phone]   On back, knees bent, feet flat, band across thighs, elbows straight but relaxed. Pull hands apart (start). Keeping elbows straight, bring arms up and over head, hands toward floor. Keep pull steady on band. Hold momentarily. Return slowly, keeping pull steady, back to start. Then do same with a wider grip on the band (past shoulder width) Repeat _10__ times. Band color __red____   Side Pull: Double Arm   On back, knees bent, feet flat. Arms perpendicular to body, shoulder level, elbows straight but relaxed. Pull arms out to sides, elbows straight. Resistance band comes across collarbones, hands toward floor. Hold momentarily. Slowly return to starting position. Repeat _10__ times. Band color _red____   Sword   On back, knees bent, feet flat, left hand on left hip, right hand above left. Pull right arm DIAGONALLY (hip to shoulder) across chest. Bring right arm along head toward floor. Hold momentarily. Slowly return to starting position. Repeat on opposite side.  Repeat _10__ times. Do with left arm. Band color _red_____   Shoulder Rotation: Double Arm   On back, knees bent, feet flat, elbows tucked at sides, bent 90, hands palms up. Pull hands apart and down toward floor, keeping elbows near sides. Hold momentarily. Slowly return to starting position. Repeat _10__ times. Band color __red.  

## 2018-06-26 ENCOUNTER — Ambulatory Visit
Admission: RE | Admit: 2018-06-26 | Discharge: 2018-06-26 | Disposition: A | Discharge status: Home / Self Care | Payer: 59 | Source: Ambulatory Visit | Attending: Radiation Oncology | Admitting: Radiation Oncology

## 2018-06-26 DIAGNOSIS — Z170 Estrogen receptor positive status [ER+]: Secondary | ICD-10-CM | POA: Diagnosis not present

## 2018-06-26 DIAGNOSIS — Z510 Encounter for antineoplastic radiation therapy: Secondary | ICD-10-CM | POA: Diagnosis not present

## 2018-06-26 DIAGNOSIS — C50412 Malignant neoplasm of upper-outer quadrant of left female breast: Secondary | ICD-10-CM | POA: Diagnosis not present

## 2018-06-29 ENCOUNTER — Ambulatory Visit: Payer: 59 | Admitting: Rehabilitation

## 2018-06-29 ENCOUNTER — Ambulatory Visit
Admission: RE | Admit: 2018-06-29 | Discharge: 2018-06-29 | Disposition: A | Discharge status: Home / Self Care | Payer: 59 | Source: Ambulatory Visit | Attending: Radiation Oncology | Admitting: Radiation Oncology

## 2018-06-29 DIAGNOSIS — C50412 Malignant neoplasm of upper-outer quadrant of left female breast: Secondary | ICD-10-CM | POA: Diagnosis not present

## 2018-06-29 DIAGNOSIS — Z170 Estrogen receptor positive status [ER+]: Secondary | ICD-10-CM | POA: Diagnosis not present

## 2018-06-29 DIAGNOSIS — Z510 Encounter for antineoplastic radiation therapy: Secondary | ICD-10-CM | POA: Diagnosis not present

## 2018-06-30 ENCOUNTER — Ambulatory Visit
Admission: RE | Admit: 2018-06-30 | Discharge: 2018-06-30 | Disposition: A | Discharge status: Home / Self Care | Payer: 59 | Source: Ambulatory Visit | Attending: Radiation Oncology | Admitting: Radiation Oncology

## 2018-06-30 DIAGNOSIS — Z170 Estrogen receptor positive status [ER+]: Secondary | ICD-10-CM | POA: Diagnosis not present

## 2018-06-30 DIAGNOSIS — C50412 Malignant neoplasm of upper-outer quadrant of left female breast: Secondary | ICD-10-CM | POA: Diagnosis not present

## 2018-06-30 DIAGNOSIS — Z510 Encounter for antineoplastic radiation therapy: Secondary | ICD-10-CM | POA: Diagnosis not present

## 2018-07-01 ENCOUNTER — Ambulatory Visit
Admission: RE | Admit: 2018-07-01 | Discharge: 2018-07-01 | Disposition: A | Discharge status: Home / Self Care | Payer: 59 | Source: Ambulatory Visit | Attending: Radiation Oncology | Admitting: Radiation Oncology

## 2018-07-01 ENCOUNTER — Ambulatory Visit: Payer: 59

## 2018-07-01 DIAGNOSIS — Z170 Estrogen receptor positive status [ER+]: Secondary | ICD-10-CM | POA: Diagnosis not present

## 2018-07-01 DIAGNOSIS — C50412 Malignant neoplasm of upper-outer quadrant of left female breast: Secondary | ICD-10-CM | POA: Diagnosis not present

## 2018-07-01 DIAGNOSIS — M25512 Pain in left shoulder: Secondary | ICD-10-CM | POA: Diagnosis not present

## 2018-07-01 DIAGNOSIS — Z483 Aftercare following surgery for neoplasm: Secondary | ICD-10-CM

## 2018-07-01 DIAGNOSIS — Z510 Encounter for antineoplastic radiation therapy: Secondary | ICD-10-CM | POA: Diagnosis not present

## 2018-07-01 DIAGNOSIS — M25612 Stiffness of left shoulder, not elsewhere classified: Secondary | ICD-10-CM | POA: Diagnosis not present

## 2018-07-01 NOTE — Therapy (Signed)
[place], [place], [address] Phone: [phone]   Fax:  [phone]  Physical Therapy Treatment  Patient Details  Name: Anne Horton MRN: [id number] Date of Birth: 03-05-1976 Referring Provider (PT): Dr. Lindi Adie   Encounter Date: 07/01/2018  PT End of Session - 07/01/18 1549    Visit Number  4    Number of Visits  12    Date for PT Re-Evaluation  07/13/18    PT Start Time  1522    PT Stop Time  1602    PT Time Calculation (min)  40 min    Activity Tolerance  Patient tolerated treatment well    Behavior During Therapy  [facility] for tasks assessed/performed       Past Medical History:  Diagnosis Date  . Allergy   . Anemia   . Anxiety   . Cancer ([place])   . Chest pain   . Depression   . Family history of breast cancer   . Fibromyalgia    neurofibromatosis  . H/O: depression   . Hx of migraines   . Mitral regurgitation    mild per echo EF 55-60%  . Neurofibromatosis ([place])   . Palpitations   . Tachycardia    Hx. of  . Wears contact lenses     Past Surgical History:  Procedure Laterality Date  . BREAST LUMPECTOMY WITH RADIOACTIVE SEED AND SENTINEL LYMPH NODE BIOPSY Left 05/07/2018   Procedure: BREAST LUMPECTOMY WITH RADIOACTIVE SEED AND SENTINEL LYMPH NODE BIOPSY;  Surgeon: Rolm Bookbinder, MD;  Location: [place];  Service: General;  Laterality: Left;  . CESAREAN SECTION    . CHOLECYSTECTOMY N/A 01/04/2015   Procedure: LAPAROSCOPIC CHOLECYSTECTOMY WITH INTRAOPERATIVE CHOLANGIOGRAM;  Surgeon: Erroll Luna, MD;  Location: [place];  Service: General;  Laterality: N/A;  . COLONOSCOPY    . ESOPHAGOGASTRODUODENOSCOPY    . WISDOM TOOTH EXTRACTION      There were no vitals filed for this visit.  Subjective Assessment - 07/01/18 1524    Subjective  My Lt shoulder at the cording feels tight today but not painful. Doing the new HEP without problems.     Pertinent History  05/07/18 Left lumpectomy: IDC grade 1, 1.2 cm, intermediate grade DCIS, margins negative,PASH, 0/7 sentinel lymph nodes negative, ER 95%, PR 95%, Ki-67 1%, HER-2 negative ratio 1.47, T1CN0 stage Ia, no chemo needed, will begin radiation next week, firomyalgia, depression    Patient Stated Goals  back to normal with the shoulder, get rid of the cording      Currently in Pain?  No/denies         [facility] PT Assessment - 07/01/18 0001      AROM   Left Shoulder Flexion  162 Degrees    Left Shoulder ABduction  170 Degrees                   [facility] Adult PT Treatment/Exercise - 07/01/18 0001      Exercises   Exercises  Other Exercises    Other Exercises   Began instruction of Strength ABC Program today completing all stretches holding for 15 sec and doing 1 rep each side; replaced butterfly stretch for piriformis figure 4 seated in chair; then completed core strengthening portion as well, also instructing pt in reverse curl in addition to or in lieu of crunch.      Shoulder Exercises: Therapy Ball   Flexion  Both;10 reps   Forward lean  into end of stretch   ABduction  Right;5 reps   Same side lean into end of stretch     Manual Therapy   Manual Therapy  Myofascial release;Passive ROM    Myofascial Release  To Lt axilla where cording was minimally palpable today but then was diminshed after stretching    Passive ROM  In Supine to Lt shoulder into flexion, abduction and D2 to tolerance             PT Education - 07/01/18 1548    Education Details  Began instruction of Strength ABC Program getting through all stretches and core strengthening exercises    Person(s) Educated  Patient    Methods  Explanation;Demonstration;Handout    Comprehension  Verbalized understanding;Returned demonstration;Need further instruction          PT Long Term Goals - 06/15/18 1546      PT LONG TERM GOAL #1   Title  Pt will decrease QDASH to at least 10 points to  demonstrate a MCID    Time  4    Period  Weeks    Status  New    Target Date  07/13/18      PT LONG TERM GOAL #2   Title  Pt will improve Lt shoulder AROM to flex; 150 and Abd: 170 to match the unaffected UE    Time  4    Period  Weeks    Status  New    Target Date  07/13/18      PT LONG TERM GOAL #3   Title  Pt will be ind with final HEP    Time  4    Period  Weeks    Status  New    Target Date  07/13/18      PT LONG TERM GOAL #4   Title  Pt will be aware of lymphedema signs and symptoms as well as precautions     Time  4    Period  Weeks    Status  New    Target Date  07/13/18            Plan - 07/01/18 1608    Clinical Impression Statement  Pt came in c/o tightness at Lt shoulder so began session with focus on end ROM and her cording was slightly palpable today. But after stretching for a bit her end P/ROM was full, cording was no longer palpable and pt reported tightness much improved so began instruction of Strength ABC Program which she tolerated very well. Pts A/ROM also improved by a few degrees since last measured. She is overall progressing well towards goals and should be ready for D/C per POC next week after finalizing instruction of Strength ABC Program.     Rehab Potential  Excellent    PT Frequency  2x / week    PT Duration  3 weeks    PT Treatment/Interventions  Therapeutic exercise;Patient/family education;Manual techniques;Taping;Passive range of motion    PT Next Visit Plan  Complete instruction of Strength ABC Program and plan to D/C in next 1-2 visits.     Consulted and Agree with Plan of Care  Patient       Patient will benefit from skilled therapeutic intervention in order to improve the following deficits and impairments:  Decreased knowledge of precautions, Decreased range of motion, Impaired UE functional use, Pain, Decreased mobility  Visit Diagnosis: Stiffness of left shoulder, not elsewhere classified  Aftercare following surgery for  neoplasm  Acute  pain of left shoulder     Problem List Patient Active Problem List   Diagnosis Date Noted  . Genetic testing 04/16/2018  . Malignant neoplasm of upper-outer quadrant of left breast in female, estrogen receptor positive ([place]) 03/25/2018  . Family history of breast cancer 03/24/2018  . Anemia, iron deficiency 10/20/2014  . Chest pain   . Palpitations   . Neurofibromatosis ([place])   . Tachycardia   . Hx of migraines   . H/O: depression   . Mitral regurgitation   . BLOOD IN STOOL 01/12/2009  . BLOOD IN STOOL, OCCULT 01/12/2009    Otelia Limes, PTA 07/01/2018, 4:12 PM  [place], [place], [address] Phone: [phone]   Fax:  [phone]  Name: Anne Horton MRN: [id number] Date of Birth: 02-12-1976

## 2018-07-02 ENCOUNTER — Ambulatory Visit
Admission: RE | Admit: 2018-07-02 | Discharge: 2018-07-02 | Disposition: A | Discharge status: Home / Self Care | Payer: 59 | Source: Ambulatory Visit | Attending: Radiation Oncology | Admitting: Radiation Oncology

## 2018-07-02 DIAGNOSIS — Z170 Estrogen receptor positive status [ER+]: Secondary | ICD-10-CM | POA: Diagnosis not present

## 2018-07-02 DIAGNOSIS — Z510 Encounter for antineoplastic radiation therapy: Secondary | ICD-10-CM | POA: Diagnosis not present

## 2018-07-02 DIAGNOSIS — C50412 Malignant neoplasm of upper-outer quadrant of left female breast: Secondary | ICD-10-CM | POA: Diagnosis not present

## 2018-07-03 ENCOUNTER — Ambulatory Visit
Admission: RE | Admit: 2018-07-03 | Discharge: 2018-07-03 | Disposition: A | Discharge status: Home / Self Care | Payer: 59 | Source: Ambulatory Visit | Attending: Radiation Oncology | Admitting: Radiation Oncology

## 2018-07-03 DIAGNOSIS — Z170 Estrogen receptor positive status [ER+]: Secondary | ICD-10-CM | POA: Diagnosis not present

## 2018-07-03 DIAGNOSIS — Z510 Encounter for antineoplastic radiation therapy: Secondary | ICD-10-CM | POA: Diagnosis not present

## 2018-07-03 DIAGNOSIS — C50412 Malignant neoplasm of upper-outer quadrant of left female breast: Secondary | ICD-10-CM | POA: Diagnosis not present

## 2018-07-06 ENCOUNTER — Encounter: Payer: Self-pay | Admitting: Rehabilitation

## 2018-07-06 ENCOUNTER — Ambulatory Visit
Admission: RE | Admit: 2018-07-06 | Discharge: 2018-07-06 | Disposition: A | Discharge status: Home / Self Care | Payer: 59 | Source: Ambulatory Visit | Attending: Radiation Oncology | Admitting: Radiation Oncology

## 2018-07-06 ENCOUNTER — Ambulatory Visit: Payer: 59 | Admitting: Rehabilitation

## 2018-07-06 DIAGNOSIS — M25612 Stiffness of left shoulder, not elsewhere classified: Secondary | ICD-10-CM

## 2018-07-06 DIAGNOSIS — M25512 Pain in left shoulder: Secondary | ICD-10-CM

## 2018-07-06 DIAGNOSIS — Z483 Aftercare following surgery for neoplasm: Secondary | ICD-10-CM | POA: Diagnosis not present

## 2018-07-06 DIAGNOSIS — Z170 Estrogen receptor positive status [ER+]: Secondary | ICD-10-CM | POA: Diagnosis not present

## 2018-07-06 DIAGNOSIS — Z510 Encounter for antineoplastic radiation therapy: Secondary | ICD-10-CM | POA: Diagnosis not present

## 2018-07-06 DIAGNOSIS — C50412 Malignant neoplasm of upper-outer quadrant of left female breast: Secondary | ICD-10-CM | POA: Diagnosis not present

## 2018-07-06 NOTE — Therapy (Signed)
[place], [place], [address] Phone: [phone]   Fax:  [phone]  Physical Therapy Treatment  Patient Details  Name: Anne Horton MRN: [id number] Date of Birth: [birth year] Referring Provider (PT): Dr. Lindi Adie   Encounter Date: 07/06/2018  PT End of Session - 07/06/18 1626    Visit Number  5    Number of Visits  12    Date for PT Re-Evaluation  07/13/18    PT Start Time  1602    PT Stop Time  1635    PT Time Calculation (min)  33 min    Activity Tolerance  Patient tolerated treatment well    Behavior During Therapy  [facility] for tasks assessed/performed       Past Medical History:  Diagnosis Date  . Allergy   . Anemia   . Anxiety   . Cancer ([place])   . Chest pain   . Depression   . Family history of breast cancer   . Fibromyalgia    neurofibromatosis  . H/O: depression   . Hx of migraines   . Mitral regurgitation    mild per echo EF 55-60%  . Neurofibromatosis ([place])   . Palpitations   . Tachycardia    Hx. of  . Wears contact lenses     Past Surgical History:  Procedure Laterality Date  . BREAST LUMPECTOMY WITH RADIOACTIVE SEED AND SENTINEL LYMPH NODE BIOPSY Left 05/07/2018   Procedure: BREAST LUMPECTOMY WITH RADIOACTIVE SEED AND SENTINEL LYMPH NODE BIOPSY;  Surgeon: Rolm Bookbinder, MD;  Location: [facility];  Service: General;  Laterality: Left;  . CESAREAN SECTION    . CHOLECYSTECTOMY N/A 01/04/2015   Procedure: LAPAROSCOPIC CHOLECYSTECTOMY WITH INTRAOPERATIVE CHOLANGIOGRAM;  Surgeon: Erroll Luna, MD;  Location: [facility];  Service: General;  Laterality: N/A;  . COLONOSCOPY    . ESOPHAGOGASTRODUODENOSCOPY    . WISDOM TOOTH EXTRACTION      There were no vitals filed for this visit.  Subjective Assessment - 07/06/18 1602    Subjective  A little tight this morning.  Today was the worst it has felt lately. but loosened up after the shower.   tried strength abc x 2 stretching part only     Pertinent History  05/07/18 Left lumpectomy: IDC grade 1, 1.2 cm, intermediate grade DCIS, margins negative,PASH, 0/7 sentinel lymph nodes negative, ER 95%, PR 95%, Ki-67 1%, HER-2 negative ratio 1.47, T1CN0 stage Ia, no chemo needed, will begin radiation next week, firomyalgia, depression    Patient Stated Goals  back to normal with the shoulder, get rid of the cording      Currently in Pain?  No/denies               Katina Dung - 07/06/18 0001    Open a tight or new jar  No difficulty    Do heavy household chores (wash walls, wash floors)  No difficulty    Carry a shopping bag or briefcase  No difficulty    Wash your back  No difficulty    Use a knife to cut food  No difficulty    Recreational activities in which you take some force or impact through your arm, shoulder, or hand (golf, hammering, tennis)  Mild difficulty    During the past week, to what extent has your arm, shoulder or hand problem interfered with your normal social activities with family, friends, neighbors, or groups?  Not at all  During the past week, to what extent has your arm, shoulder or hand problem limited your work or other regular daily activities  Not at all    Arm, shoulder, or hand pain.  Mild    Tingling (pins and needles) in your arm, shoulder, or hand  Mild    Difficulty Sleeping  No difficulty    DASH Score  6.82 %                          PT Long Term Goals - 07/06/18 1606      PT LONG TERM GOAL #1   Title  Pt will decrease QDASH to at least 10 points to demonstrate a MCID    Baseline  to 6.8% today     Status  Achieved      PT LONG TERM GOAL #2   Title  Pt will improve Lt shoulder AROM to flex; 150 and Abd: 170 to match the unaffected UE    Baseline  flex: 156, abd; 172     Status  Achieved      PT LONG TERM GOAL #3   Title  Pt will be ind with final HEP    Status  Achieved      PT LONG TERM GOAL #4   Title  Pt will  be aware of lymphedema signs and symptoms as well as precautions     Status  Achieved            Plan - 07/06/18 1649    Clinical Impression Statement  Anne Horton feels ready for DC today with all goals met.  No pulling during PROM and ind now with the strength ABC program.      PT Frequency  2x / week    PT Duration  3 weeks    PT Treatment/Interventions  Therapeutic exercise;Patient/family education;Manual techniques;Taping;Passive range of motion    Consulted and Agree with Plan of Care  Patient       Patient will benefit from skilled therapeutic intervention in order to improve the following deficits and impairments:  Decreased knowledge of precautions, Decreased range of motion, Impaired UE functional use, Pain, Decreased mobility  Visit Diagnosis: Stiffness of left shoulder, not elsewhere classified  Aftercare following surgery for neoplasm  Acute pain of left shoulder     Problem List Patient Active Problem List   Diagnosis Date Noted  . Genetic testing 04/16/2018  . Malignant neoplasm of upper-outer quadrant of left breast in female, estrogen receptor positive ([place]) 03/25/2018  . Family history of breast cancer 03/24/2018  . Anemia, iron deficiency 10/20/2014  . Chest pain   . Palpitations   . Neurofibromatosis ([place])   . Tachycardia   . Hx of migraines   . H/O: depression   . Mitral regurgitation   . BLOOD IN STOOL 01/12/2009  . BLOOD IN [place], OCCULT 01/12/2009    Shan Levans, PT 07/06/2018, 4:56 PM  [place], [place], [address] Phone: [phone]   Fax:  [phone]  Name: Anne Horton MRN: [id number] Date of Birth: 1976/03/23   PHYSICAL THERAPY DISCHARGE SUMMARY  Visits from Start of Care: 5  Current functional level related to goals / functional outcomes: See above   Remaining deficits: Need for continued ROM and strength    Education /  Equipment:  Plan: Patient agrees to discharge.  Patient goals were met. Patient is being discharged due to meeting the stated  rehab goals.  ?????

## 2018-07-07 ENCOUNTER — Ambulatory Visit
Admission: RE | Admit: 2018-07-07 | Discharge: 2018-07-07 | Disposition: A | Discharge status: Home / Self Care | Payer: 59 | Source: Ambulatory Visit | Attending: Radiation Oncology | Admitting: Radiation Oncology

## 2018-07-07 DIAGNOSIS — Z170 Estrogen receptor positive status [ER+]: Secondary | ICD-10-CM | POA: Diagnosis not present

## 2018-07-07 DIAGNOSIS — Z510 Encounter for antineoplastic radiation therapy: Secondary | ICD-10-CM | POA: Diagnosis not present

## 2018-07-07 DIAGNOSIS — C50412 Malignant neoplasm of upper-outer quadrant of left female breast: Secondary | ICD-10-CM | POA: Diagnosis not present

## 2018-07-08 ENCOUNTER — Ambulatory Visit
Admission: RE | Admit: 2018-07-08 | Discharge: 2018-07-08 | Disposition: A | Discharge status: Home / Self Care | Payer: 59 | Source: Ambulatory Visit | Attending: Radiation Oncology | Admitting: Radiation Oncology

## 2018-07-08 ENCOUNTER — Ambulatory Visit: Payer: 59

## 2018-07-08 DIAGNOSIS — C50412 Malignant neoplasm of upper-outer quadrant of left female breast: Secondary | ICD-10-CM | POA: Diagnosis not present

## 2018-07-08 DIAGNOSIS — Z510 Encounter for antineoplastic radiation therapy: Secondary | ICD-10-CM | POA: Diagnosis not present

## 2018-07-08 DIAGNOSIS — Z170 Estrogen receptor positive status [ER+]: Secondary | ICD-10-CM | POA: Diagnosis not present

## 2018-07-09 ENCOUNTER — Ambulatory Visit
Admission: RE | Admit: 2018-07-09 | Discharge: 2018-07-09 | Disposition: A | Discharge status: Home / Self Care | Payer: 59 | Source: Ambulatory Visit | Attending: Radiation Oncology | Admitting: Radiation Oncology

## 2018-07-09 DIAGNOSIS — Z170 Estrogen receptor positive status [ER+]: Secondary | ICD-10-CM | POA: Diagnosis not present

## 2018-07-09 DIAGNOSIS — C50412 Malignant neoplasm of upper-outer quadrant of left female breast: Secondary | ICD-10-CM | POA: Diagnosis not present

## 2018-07-09 DIAGNOSIS — Z510 Encounter for antineoplastic radiation therapy: Secondary | ICD-10-CM | POA: Diagnosis not present

## 2018-07-10 ENCOUNTER — Ambulatory Visit
Admission: RE | Admit: 2018-07-10 | Discharge: 2018-07-10 | Disposition: A | Discharge status: Home / Self Care | Payer: 59 | Source: Ambulatory Visit | Attending: Radiation Oncology | Admitting: Radiation Oncology

## 2018-07-10 DIAGNOSIS — Z510 Encounter for antineoplastic radiation therapy: Secondary | ICD-10-CM | POA: Diagnosis not present

## 2018-07-10 DIAGNOSIS — Z170 Estrogen receptor positive status [ER+]: Secondary | ICD-10-CM | POA: Diagnosis not present

## 2018-07-10 DIAGNOSIS — C50412 Malignant neoplasm of upper-outer quadrant of left female breast: Secondary | ICD-10-CM | POA: Diagnosis not present

## 2018-07-13 ENCOUNTER — Ambulatory Visit
Admission: RE | Admit: 2018-07-13 | Discharge: 2018-07-13 | Disposition: A | Discharge status: Home / Self Care | Payer: 59 | Source: Ambulatory Visit | Attending: Radiation Oncology | Admitting: Radiation Oncology

## 2018-07-13 DIAGNOSIS — C50412 Malignant neoplasm of upper-outer quadrant of left female breast: Secondary | ICD-10-CM | POA: Diagnosis not present

## 2018-07-13 DIAGNOSIS — Z170 Estrogen receptor positive status [ER+]: Secondary | ICD-10-CM | POA: Diagnosis not present

## 2018-07-13 DIAGNOSIS — Z510 Encounter for antineoplastic radiation therapy: Secondary | ICD-10-CM | POA: Diagnosis not present

## 2018-07-14 ENCOUNTER — Ambulatory Visit
Admission: RE | Admit: 2018-07-14 | Discharge: 2018-07-14 | Disposition: A | Discharge status: Home / Self Care | Payer: 59 | Source: Ambulatory Visit | Attending: Radiation Oncology | Admitting: Radiation Oncology

## 2018-07-14 DIAGNOSIS — Z510 Encounter for antineoplastic radiation therapy: Secondary | ICD-10-CM | POA: Diagnosis not present

## 2018-07-14 DIAGNOSIS — C50412 Malignant neoplasm of upper-outer quadrant of left female breast: Secondary | ICD-10-CM | POA: Diagnosis not present

## 2018-07-14 DIAGNOSIS — Z170 Estrogen receptor positive status [ER+]: Secondary | ICD-10-CM | POA: Diagnosis not present

## 2018-07-15 ENCOUNTER — Ambulatory Visit
Admission: RE | Admit: 2018-07-15 | Discharge: 2018-07-15 | Disposition: A | Discharge status: Home / Self Care | Payer: 59 | Source: Ambulatory Visit | Attending: Radiation Oncology | Admitting: Radiation Oncology

## 2018-07-15 DIAGNOSIS — Z510 Encounter for antineoplastic radiation therapy: Secondary | ICD-10-CM | POA: Diagnosis not present

## 2018-07-15 DIAGNOSIS — C50412 Malignant neoplasm of upper-outer quadrant of left female breast: Secondary | ICD-10-CM | POA: Diagnosis not present

## 2018-07-15 DIAGNOSIS — Z170 Estrogen receptor positive status [ER+]: Secondary | ICD-10-CM | POA: Diagnosis not present

## 2018-07-16 ENCOUNTER — Ambulatory Visit
Admission: RE | Admit: 2018-07-16 | Discharge: 2018-07-16 | Disposition: A | Discharge status: Home / Self Care | Payer: 59 | Source: Ambulatory Visit | Attending: Radiation Oncology | Admitting: Radiation Oncology

## 2018-07-16 DIAGNOSIS — C50412 Malignant neoplasm of upper-outer quadrant of left female breast: Secondary | ICD-10-CM | POA: Diagnosis not present

## 2018-07-16 DIAGNOSIS — Z510 Encounter for antineoplastic radiation therapy: Secondary | ICD-10-CM | POA: Diagnosis not present

## 2018-07-16 DIAGNOSIS — Z170 Estrogen receptor positive status [ER+]: Secondary | ICD-10-CM | POA: Diagnosis not present

## 2018-07-17 ENCOUNTER — Ambulatory Visit
Admission: RE | Admit: 2018-07-17 | Discharge: 2018-07-17 | Disposition: A | Discharge status: Home / Self Care | Payer: 59 | Source: Ambulatory Visit | Attending: Radiation Oncology | Admitting: Radiation Oncology

## 2018-07-17 DIAGNOSIS — Z510 Encounter for antineoplastic radiation therapy: Secondary | ICD-10-CM | POA: Insufficient documentation

## 2018-07-17 DIAGNOSIS — Z170 Estrogen receptor positive status [ER+]: Secondary | ICD-10-CM | POA: Insufficient documentation

## 2018-07-17 DIAGNOSIS — C50412 Malignant neoplasm of upper-outer quadrant of left female breast: Secondary | ICD-10-CM | POA: Insufficient documentation

## 2018-07-20 ENCOUNTER — Ambulatory Visit
Admission: RE | Admit: 2018-07-20 | Discharge: 2018-07-20 | Disposition: A | Discharge status: Home / Self Care | Payer: 59 | Source: Ambulatory Visit | Attending: Radiation Oncology | Admitting: Radiation Oncology

## 2018-07-20 DIAGNOSIS — Z510 Encounter for antineoplastic radiation therapy: Secondary | ICD-10-CM | POA: Diagnosis not present

## 2018-07-20 DIAGNOSIS — Z170 Estrogen receptor positive status [ER+]: Secondary | ICD-10-CM | POA: Diagnosis not present

## 2018-07-20 DIAGNOSIS — C50412 Malignant neoplasm of upper-outer quadrant of left female breast: Secondary | ICD-10-CM | POA: Diagnosis not present

## 2018-07-21 ENCOUNTER — Ambulatory Visit
Admission: RE | Admit: 2018-07-21 | Discharge: 2018-07-21 | Disposition: A | Discharge status: Home / Self Care | Payer: 59 | Source: Ambulatory Visit | Attending: Radiation Oncology | Admitting: Radiation Oncology

## 2018-07-21 ENCOUNTER — Other Ambulatory Visit: Payer: Self-pay | Admitting: Radiation Oncology

## 2018-07-21 DIAGNOSIS — Z170 Estrogen receptor positive status [ER+]: Secondary | ICD-10-CM | POA: Diagnosis not present

## 2018-07-21 DIAGNOSIS — C50412 Malignant neoplasm of upper-outer quadrant of left female breast: Secondary | ICD-10-CM | POA: Diagnosis not present

## 2018-07-21 DIAGNOSIS — Z510 Encounter for antineoplastic radiation therapy: Secondary | ICD-10-CM | POA: Diagnosis not present

## 2018-07-21 MED ORDER — OXYCODONE HCL 5 MG PO TABS: 5.0000 mg | ORAL_TABLET | Freq: Four times a day (QID) | ORAL | 0 refills | Status: DC | PRN

## 2018-07-21 MED ORDER — PROCHLORPERAZINE MALEATE 10 MG PO TABS: 10.0000 mg | ORAL_TABLET | Freq: Four times a day (QID) | ORAL | 0 refills | Status: DC | PRN

## 2018-07-21 MED FILL — oxyCODONE HCL 5 MG TABS: 5 | 7 days supply | Qty: 30 | Fill #0

## 2018-07-21 MED FILL — PROCHLORPERAZINE 10 MG TAB: 10 | 7 days supply | Qty: 30 | Fill #0

## 2018-07-22 ENCOUNTER — Ambulatory Visit
Admission: RE | Admit: 2018-07-22 | Discharge: 2018-07-22 | Disposition: A | Discharge status: Home / Self Care | Payer: 59 | Source: Ambulatory Visit | Attending: Radiation Oncology | Admitting: Radiation Oncology

## 2018-07-22 DIAGNOSIS — C50412 Malignant neoplasm of upper-outer quadrant of left female breast: Secondary | ICD-10-CM | POA: Diagnosis not present

## 2018-07-22 DIAGNOSIS — Z510 Encounter for antineoplastic radiation therapy: Secondary | ICD-10-CM | POA: Diagnosis not present

## 2018-07-22 DIAGNOSIS — Z170 Estrogen receptor positive status [ER+]: Secondary | ICD-10-CM | POA: Diagnosis not present

## 2018-07-23 ENCOUNTER — Ambulatory Visit
Admission: RE | Admit: 2018-07-23 | Discharge: 2018-07-23 | Disposition: A | Discharge status: Home / Self Care | Payer: 59 | Source: Ambulatory Visit | Attending: Radiation Oncology | Admitting: Radiation Oncology

## 2018-07-23 DIAGNOSIS — Z510 Encounter for antineoplastic radiation therapy: Secondary | ICD-10-CM | POA: Diagnosis not present

## 2018-07-23 DIAGNOSIS — C50412 Malignant neoplasm of upper-outer quadrant of left female breast: Secondary | ICD-10-CM | POA: Diagnosis not present

## 2018-07-23 DIAGNOSIS — Z170 Estrogen receptor positive status [ER+]: Secondary | ICD-10-CM | POA: Diagnosis not present

## 2018-07-24 ENCOUNTER — Ambulatory Visit: Payer: 59

## 2018-07-27 ENCOUNTER — Other Ambulatory Visit: Payer: Self-pay

## 2018-07-27 ENCOUNTER — Ambulatory Visit
Admission: RE | Admit: 2018-07-27 | Discharge: 2018-07-27 | Disposition: A | Discharge status: Home / Self Care | Payer: 59 | Source: Ambulatory Visit | Attending: Radiation Oncology | Admitting: Radiation Oncology

## 2018-07-27 DIAGNOSIS — Z170 Estrogen receptor positive status [ER+]: Principal | ICD-10-CM

## 2018-07-27 DIAGNOSIS — Z510 Encounter for antineoplastic radiation therapy: Secondary | ICD-10-CM | POA: Diagnosis not present

## 2018-07-27 DIAGNOSIS — C50412 Malignant neoplasm of upper-outer quadrant of left female breast: Secondary | ICD-10-CM | POA: Diagnosis not present

## 2018-07-27 MED FILL — ALPRAZolam 0.5 MG TABS: 0.5 | 30 days supply | Qty: 60 | Fill #1

## 2018-07-27 MED FILL — DULoxetine HCL 60 MG CPEP: 60 | 30 days supply | Qty: 60 | Fill #1

## 2018-07-28 ENCOUNTER — Ambulatory Visit
Admission: RE | Admit: 2018-07-28 | Discharge: 2018-07-28 | Disposition: A | Discharge status: Home / Self Care | Payer: 59 | Source: Ambulatory Visit | Attending: Radiation Oncology | Admitting: Radiation Oncology

## 2018-07-28 ENCOUNTER — Ambulatory Visit: Payer: 59 | Admitting: Radiation Oncology

## 2018-07-28 DIAGNOSIS — Z510 Encounter for antineoplastic radiation therapy: Secondary | ICD-10-CM | POA: Diagnosis not present

## 2018-07-28 DIAGNOSIS — Z170 Estrogen receptor positive status [ER+]: Secondary | ICD-10-CM | POA: Diagnosis not present

## 2018-07-28 DIAGNOSIS — C50412 Malignant neoplasm of upper-outer quadrant of left female breast: Secondary | ICD-10-CM

## 2018-07-28 MED ORDER — SILVER SULFADIAZINE 1 % EX CREA
TOPICAL_CREAM | Freq: Two times a day (BID) | CUTANEOUS | Status: DC
  Administered 2018-07-28: 12:00:00 via TOPICAL

## 2018-07-29 ENCOUNTER — Ambulatory Visit
Admission: RE | Admit: 2018-07-29 | Discharge: 2018-07-29 | Disposition: A | Discharge status: Home / Self Care | Payer: 59 | Source: Ambulatory Visit | Attending: Radiation Oncology | Admitting: Radiation Oncology

## 2018-07-29 DIAGNOSIS — Z170 Estrogen receptor positive status [ER+]: Secondary | ICD-10-CM | POA: Diagnosis not present

## 2018-07-29 DIAGNOSIS — C50412 Malignant neoplasm of upper-outer quadrant of left female breast: Secondary | ICD-10-CM | POA: Diagnosis not present

## 2018-07-29 DIAGNOSIS — Z510 Encounter for antineoplastic radiation therapy: Secondary | ICD-10-CM | POA: Diagnosis not present

## 2018-07-30 ENCOUNTER — Ambulatory Visit
Admission: RE | Admit: 2018-07-30 | Discharge: 2018-07-30 | Disposition: A | Discharge status: Home / Self Care | Payer: 59 | Source: Ambulatory Visit | Attending: Radiation Oncology | Admitting: Radiation Oncology

## 2018-07-30 DIAGNOSIS — Z170 Estrogen receptor positive status [ER+]: Secondary | ICD-10-CM | POA: Diagnosis not present

## 2018-07-30 DIAGNOSIS — C50412 Malignant neoplasm of upper-outer quadrant of left female breast: Secondary | ICD-10-CM | POA: Diagnosis not present

## 2018-07-30 DIAGNOSIS — Z510 Encounter for antineoplastic radiation therapy: Secondary | ICD-10-CM | POA: Diagnosis not present

## 2018-07-31 ENCOUNTER — Encounter: Payer: Self-pay | Admitting: Radiation Oncology

## 2018-07-31 ENCOUNTER — Ambulatory Visit: Payer: 59

## 2018-07-31 ENCOUNTER — Ambulatory Visit
Admission: RE | Admit: 2018-07-31 | Discharge: 2018-07-31 | Disposition: A | Discharge status: Home / Self Care | Payer: 59 | Source: Ambulatory Visit | Attending: Radiation Oncology | Admitting: Radiation Oncology

## 2018-07-31 DIAGNOSIS — C50412 Malignant neoplasm of upper-outer quadrant of left female breast: Secondary | ICD-10-CM | POA: Diagnosis not present

## 2018-07-31 DIAGNOSIS — Z170 Estrogen receptor positive status [ER+]: Secondary | ICD-10-CM | POA: Diagnosis not present

## 2018-07-31 DIAGNOSIS — Z510 Encounter for antineoplastic radiation therapy: Secondary | ICD-10-CM | POA: Diagnosis not present

## 2018-08-03 ENCOUNTER — Ambulatory Visit
Admission: RE | Admit: 2018-08-03 | Discharge: 2018-08-03 | Disposition: A | Discharge status: Home / Self Care | Payer: 59 | Source: Ambulatory Visit | Attending: Radiation Oncology | Admitting: Radiation Oncology

## 2018-08-03 ENCOUNTER — Ambulatory Visit: Payer: 59

## 2018-08-03 ENCOUNTER — Other Ambulatory Visit: Payer: 59

## 2018-08-03 ENCOUNTER — Inpatient Hospital Stay: Payer: 59 | Attending: Genetic Counselor | Admitting: Hematology and Oncology

## 2018-08-03 ENCOUNTER — Telehealth: Payer: Self-pay | Admitting: Hematology and Oncology

## 2018-08-03 ENCOUNTER — Inpatient Hospital Stay: Payer: 59

## 2018-08-03 DIAGNOSIS — C50412 Malignant neoplasm of upper-outer quadrant of left female breast: Secondary | ICD-10-CM

## 2018-08-03 DIAGNOSIS — Z170 Estrogen receptor positive status [ER+]: Secondary | ICD-10-CM | POA: Diagnosis not present

## 2018-08-03 DIAGNOSIS — Z79899 Other long term (current) drug therapy: Secondary | ICD-10-CM | POA: Diagnosis not present

## 2018-08-03 DIAGNOSIS — Z510 Encounter for antineoplastic radiation therapy: Secondary | ICD-10-CM | POA: Diagnosis not present

## 2018-08-03 DIAGNOSIS — Z791 Long term (current) use of non-steroidal anti-inflammatories (NSAID): Secondary | ICD-10-CM | POA: Insufficient documentation

## 2018-08-03 LAB — CMP (CANCER CENTER ONLY)
ALT: 10 U/L (ref 0–44)
AST: 13 U/L — ABNORMAL LOW (ref 15–41)
Albumin: 3.8 g/dL (ref 3.5–5.0)
Alkaline Phosphatase: 61 U/L (ref 38–126)
Anion gap: 9 (ref 5–15)
BUN: 8 mg/dL (ref 6–20)
CHLORIDE: 108 mmol/L (ref 98–111)
CO2: 25 mmol/L (ref 22–32)
CREATININE: 0.74 mg/dL (ref 0.44–1.00)
Calcium: 9 mg/dL (ref 8.9–10.3)
GFR, Estimated: >60 mL/min (ref >60)
Glucose, Bld: 97 mg/dL (ref 70–99)
POTASSIUM: 4 mmol/L (ref 3.5–5.1)
SODIUM: 142 mmol/L (ref 135–145)
Total Bilirubin: 0.4 mg/dL (ref 0.3–1.2)
Total Protein: 6.8 g/dL (ref 6.5–8.1)

## 2018-08-03 LAB — CBC WITH DIFFERENTIAL (CANCER CENTER ONLY)
ABS IMMATURE GRANULOCYTES: 0.04 10*3/uL (ref 0.00–0.07)
Basophils Absolute: 0.1 10*3/uL (ref 0.0–0.1)
Basophils Relative: 1 %
EOS PCT: 4 %
Eosinophils Absolute: 0.3 10*3/uL (ref 0.0–0.5)
HEMATOCRIT: 42.1 % (ref 36.0–46.0)
HEMOGLOBIN: 13.2 g/dL (ref 12.0–15.0)
IMMATURE GRANULOCYTES: 1 %
LYMPHS ABS: 1 10*3/uL (ref 0.7–4.0)
LYMPHS PCT: 13 %
MCH: 26.3 pg (ref 26.0–34.0)
MCHC: 31.4 g/dL (ref 30.0–36.0)
MCV: 84 fL (ref 80.0–100.0)
Monocytes Absolute: 0.7 10*3/uL (ref 0.1–1.0)
Monocytes Relative: 10 %
NEUTROS ABS: 5.3 10*3/uL (ref 1.7–7.7)
NEUTROS PCT: 71 %
NRBC: 0 % (ref 0.0–0.2)
Platelet Count: 404 10*3/uL — ABNORMAL HIGH (ref 150–400)
RBC: 5.01 MIL/uL (ref 3.87–5.11)
RDW: 13.4 % (ref 11.5–15.5)
WBC Count: 7.3 10*3/uL (ref 4.0–10.5)

## 2018-08-03 MED ORDER — TAMOXIFEN CITRATE 20 MG PO TABS: 20.0000 mg | ORAL_TABLET | Freq: Every day | ORAL | 3 refills | Status: DC

## 2018-08-03 NOTE — Telephone Encounter (Signed)
Called patient with SCP appt date/time for April.  Left message.

## 2018-08-03 NOTE — Assessment & Plan Note (Signed)
05/07/2018:Left lumpectomy: IDC grade 1, 1.2 cm, intermediate grade DCIS, margins negative,PASH, 0/7 sentinel lymph nodes negative, ER 95%, PR 95%, Ki-67 1%, HER-2 negative ratio 1.47, T1CN0 stage Ia Oncotype DX score 11: 3% risk of distant recurrence, low risk Adjuvant radiation therapy completed 08/03/2018  Current treatment: Adjuvant antiestrogen therapy with tamoxifen 20 mg daily x10 years  We discussed the risks and benefits of tamoxifen. These include but not limited to insomnia, hot flashes, mood changes, vaginal dryness, and weight gain. Although rare, serious side effects including endometrial cancer, risk of blood clots were also discussed. We strongly believe that the benefits far outweigh the risks. Patient understands these risks and consented to starting treatment. Planned treatment duration is 10 years.  Return to clinic in 3 months for survivorship care plan visit

## 2018-08-03 NOTE — Progress Notes (Signed)
Patient Care Team: Daphene Calamity as PCP - General (Family Medicine) [name], Wonda Cheng, MD as PCP - Cardiology (Cardiology)  DIAGNOSIS:  Encounter Diagnosis  Name Primary?  . Malignant neoplasm of upper-outer quadrant of left breast in female, estrogen receptor positive ([place])     SUMMARY OF ONCOLOGIC HISTORY:   Malignant neoplasm of upper-outer quadrant of left breast in female, estrogen receptor positive ([place])   03/20/2018 Initial Diagnosis    Screening detected left breast spiculated mass by ultrasound measured 1.2 cm.  Biopsy revealed invasive ductal carcinoma grade 1-2 with high-grade DCIS, ER 95%, PR 95%, Ki-67 1%, HER-2 negative ratio 1.47, T1CN0 stage I a clinical stage    03/20/2018 Cancer Staging    Staging form: Breast, AJCC 8th Edition - Clinical stage from 03/20/2018: Stage IA (cT1c, cN0, cM0, G2, ER+, PR+, HER2-) - Signed by Gardenia Phlegm, NP on 05/27/2018    05/07/2018 Surgery    Left lumpectomy: IDC grade 1, 1.2 cm, intermediate grade DCIS, margins negative,PASH, 0/7 sentinel lymph nodes negative, ER 95%, PR 95%, Ki-67 1%, HER-2 negative ratio 1.47, T1CN0 stage Ia    05/07/2018 Cancer Staging    Staging form: Breast, AJCC 8th Edition - Pathologic stage from 05/07/2018: Stage IA (pT1c, pN0, cM0, G1, ER+, PR+, HER2-, Oncotype DX score: 11) - Signed by Gardenia Phlegm, NP on 05/27/2018    05/25/2018 Oncotype testing    Oncotype DX score 11: 3% risk of distant recurrence of 9 years, low risk    06/24/2018 - 08/03/2018 Radiation Therapy    Adjuvant radiation therapy     CHIEF COMPLIANT: Follow-up towards end of radiation patient has 5 more treatments of boost  INTERVAL HISTORY: Anne Horton is a 42 year old with above-mentioned history of left breast cancer treated with lumpectomy and is completing adjuvant radiation.  She has radiation dermatitis and she has 5 more days of boost left.  She is sore underneath the breast.  REVIEW OF SYSTEMS:     Constitutional: Denies fevers, chills or abnormal weight loss Eyes: Denies blurriness of vision Ears, nose, mouth, throat, and face: Denies mucositis or sore throat Respiratory: Denies cough, dyspnea or wheezes Cardiovascular: Denies palpitation, chest discomfort Gastrointestinal:  Denies nausea, heartburn or change in bowel habits Skin: Denies abnormal skin rashes Lymphatics: Denies new lymphadenopathy or easy bruising Neurological:Denies numbness, tingling or new weaknesses Behavioral/Psych: Mood is stable, no new changes  Extremities: No lower extremity edema Breast: Radiation dermatitis All other systems were reviewed with the patient and are negative.  I have reviewed the past medical history, past surgical history, social history and family history with the patient and they are unchanged from previous note.  ALLERGIES:  is allergic to latex.  MEDICATIONS:  Current Outpatient Medications  Medication Sig Dispense Refill  . acetaminophen (TYLENOL) 500 MG tablet Take 500 mg by mouth every 6 (six) hours as needed.    . ALPRAZolam (XANAX) 0.5 MG tablet Take 0.5 mg by mouth at bedtime as needed (FOR ANXIETY). :take 1/2 to 1 twice a day as needed    . cetirizine (ZYRTEC) 10 MG tablet Take 10 mg by mouth as needed for allergies.     . DULoxetine (CYMBALTA) 60 MG capsule Take 60 mg by mouth daily.     Marland Kitchen ibuprofen (ADVIL,MOTRIN) 200 MG tablet Take 800 mg by mouth daily as needed for headache.    . metoprolol succinate (TOPROL-XL) 50 MG 24 hr tablet TAKE 1 TABLET BY MOUTH DAILY. TAKE WITH OR IMMEDIATELY  FOLLOWING A MEAL. 90 tablet 3  . oxyCODONE (OXY IR/ROXICODONE) 5 MG immediate release tablet Take 1 tablet (5 mg total) by mouth every 6 (six) hours as needed for moderate pain, severe pain or breakthrough pain. 30 tablet 0  . prochlorperazine (COMPAZINE) 10 MG tablet Take 1 tablet (10 mg total) by mouth every 6 (six) hours as needed for nausea or vomiting. 30 tablet 0  . propranolol  (INDERAL) 10 MG tablet TAKE 1 TABLET BY MOUTH FOUR TIMES DAILY AS NEEDED FOR PALPITATIONS 120 tablet 3   No current facility-administered medications for this visit.     PHYSICAL EXAMINATION: ECOG PERFORMANCE STATUS: 1 - Symptomatic but completely ambulatory  Vitals:   08/03/18 0853  BP: (!) 128/50  Pulse: 82  Resp: 18  Temp: 98.3 F (36.8 C)  SpO2: 100%   Filed Weights   08/03/18 0853  Weight: 142 lb 8 oz (64.6 kg)    GENERAL:alert, no distress and comfortable SKIN: skin color, texture, turgor are normal, no rashes or significant lesions EYES: normal, Conjunctiva are pink and non-injected, sclera clear OROPHARYNX:no exudate, no erythema and lips, buccal mucosa, and tongue normal  NECK: supple, thyroid normal size, non-tender, without nodularity LYMPH:  no palpable lymphadenopathy in the cervical, axillary or inguinal LUNGS: clear to auscultation and percussion with normal breathing effort HEART: regular rate & rhythm and no murmurs and no lower extremity edema ABDOMEN:abdomen soft, non-tender and normal bowel sounds MUSCULOSKELETAL:no cyanosis of digits and no clubbing  NEURO: alert & oriented x 3 with fluent speech, no focal motor/sensory deficits EXTREMITIES: No lower extremity edema    LABORATORY DATA:  I have reviewed the data as listed CMP Latest Ref Rng & Units 08/03/2018 03/24/2018 12/29/2014  Glucose 70 - 99 mg/dL 97 87 87  BUN 6 - 20 mg/dL '8 10 8  ' Creatinine 0.44 - 1.00 mg/dL 0.74 0.74 0.61  Sodium 135 - 145 mmol/L 142 138 138  Potassium 3.5 - 5.1 mmol/L 4.0 4.7 4.4  Chloride 98 - 111 mmol/L 108 102 103  CO2 22 - 32 mmol/L '25 30 28  ' Calcium 8.9 - 10.3 mg/dL 9.0 9.4 8.9  Total Protein 6.5 - 8.1 g/dL 6.8 7.1 6.5  Total Bilirubin 0.3 - 1.2 mg/dL 0.4 0.3 0.3  Alkaline Phos 38 - 126 U/L 61 71 42  AST 15 - 41 U/L 13(L) 21 19  ALT 0 - 44 U/L '10 21 15    ' Lab Results  Component Value Date   WBC 7.3 08/03/2018   HGB 13.2 08/03/2018   HCT 42.1 08/03/2018   MCV  84.0 08/03/2018   PLT 404 (H) 08/03/2018   NEUTROABS 5.3 08/03/2018    ASSESSMENT & PLAN:  Malignant neoplasm of upper-outer quadrant of left breast in female, estrogen receptor positive ([facility]) 05/07/2018:Left lumpectomy: IDC grade 1, 1.2 cm, intermediate grade DCIS, margins negative,PASH, 0/7 sentinel lymph nodes negative, ER 95%, PR 95%, Ki-67 1%, HER-2 negative ratio 1.47, T1CN0 stage Ia Oncotype DX score 11: 3% risk of distant recurrence, low risk Adjuvant radiation therapy completed 08/03/2018  Current treatment: Adjuvant antiestrogen therapy with tamoxifen 20 mg daily x10 years she will start this 09/10/2018  We discussed the risks and benefits of tamoxifen. These include but not limited to insomnia, hot flashes, mood changes, vaginal dryness, and weight gain. Although rare, serious side effects including endometrial cancer, risk of blood clots were also discussed. We strongly believe that the benefits far outweigh the risks. Patient understands these risks and consented to starting treatment.  Planned treatment duration is 10 years.  Return to clinic in April 2020 for survivorship care plan visit   No orders of the defined types were placed in this encounter.  The patient has a good understanding of the overall plan. she agrees with it. she will call with any problems that may develop before the next visit here.   Harriette Ohara, MD 08/03/18

## 2018-08-04 ENCOUNTER — Ambulatory Visit
Admission: RE | Admit: 2018-08-04 | Discharge: 2018-08-04 | Disposition: A | Discharge status: Home / Self Care | Payer: 59 | Source: Ambulatory Visit | Attending: Radiation Oncology | Admitting: Radiation Oncology

## 2018-08-04 ENCOUNTER — Ambulatory Visit: Payer: 59

## 2018-08-04 DIAGNOSIS — Z510 Encounter for antineoplastic radiation therapy: Secondary | ICD-10-CM | POA: Diagnosis not present

## 2018-08-04 DIAGNOSIS — C50412 Malignant neoplasm of upper-outer quadrant of left female breast: Secondary | ICD-10-CM | POA: Diagnosis not present

## 2018-08-04 DIAGNOSIS — Z170 Estrogen receptor positive status [ER+]: Secondary | ICD-10-CM | POA: Diagnosis not present

## 2018-08-05 ENCOUNTER — Ambulatory Visit
Admission: RE | Admit: 2018-08-05 | Discharge: 2018-08-05 | Disposition: A | Discharge status: Home / Self Care | Payer: 59 | Source: Ambulatory Visit | Attending: Radiation Oncology | Admitting: Radiation Oncology

## 2018-08-05 DIAGNOSIS — C50412 Malignant neoplasm of upper-outer quadrant of left female breast: Secondary | ICD-10-CM | POA: Diagnosis not present

## 2018-08-05 DIAGNOSIS — Z170 Estrogen receptor positive status [ER+]: Secondary | ICD-10-CM | POA: Diagnosis not present

## 2018-08-05 DIAGNOSIS — Z510 Encounter for antineoplastic radiation therapy: Secondary | ICD-10-CM | POA: Diagnosis not present

## 2018-08-06 ENCOUNTER — Ambulatory Visit: Payer: 59

## 2018-08-06 ENCOUNTER — Encounter: Payer: Self-pay | Admitting: Radiation Oncology

## 2018-08-06 ENCOUNTER — Ambulatory Visit
Admission: RE | Admit: 2018-08-06 | Discharge: 2018-08-06 | Disposition: A | Discharge status: Home / Self Care | Payer: 59 | Source: Ambulatory Visit | Attending: Radiation Oncology | Admitting: Radiation Oncology

## 2018-08-06 DIAGNOSIS — Z510 Encounter for antineoplastic radiation therapy: Secondary | ICD-10-CM | POA: Diagnosis not present

## 2018-08-06 DIAGNOSIS — C50412 Malignant neoplasm of upper-outer quadrant of left female breast: Secondary | ICD-10-CM | POA: Diagnosis not present

## 2018-08-06 DIAGNOSIS — Z170 Estrogen receptor positive status [ER+]: Secondary | ICD-10-CM | POA: Diagnosis not present

## 2018-08-07 ENCOUNTER — Ambulatory Visit: Payer: 59

## 2018-08-07 ENCOUNTER — Ambulatory Visit
Admission: RE | Admit: 2018-08-07 | Discharge: 2018-08-07 | Disposition: A | Discharge status: Home / Self Care | Payer: 59 | Source: Ambulatory Visit | Attending: Radiation Oncology | Admitting: Radiation Oncology

## 2018-08-07 DIAGNOSIS — Z510 Encounter for antineoplastic radiation therapy: Secondary | ICD-10-CM | POA: Diagnosis not present

## 2018-08-07 DIAGNOSIS — Z170 Estrogen receptor positive status [ER+]: Secondary | ICD-10-CM | POA: Diagnosis not present

## 2018-08-07 DIAGNOSIS — C50412 Malignant neoplasm of upper-outer quadrant of left female breast: Secondary | ICD-10-CM | POA: Diagnosis not present

## 2018-08-10 ENCOUNTER — Encounter: Payer: Self-pay | Admitting: Radiation Oncology

## 2018-08-10 ENCOUNTER — Ambulatory Visit
Admission: RE | Admit: 2018-08-10 | Discharge: 2018-08-10 | Disposition: A | Discharge status: Home / Self Care | Payer: 59 | Source: Ambulatory Visit | Attending: Radiation Oncology | Admitting: Radiation Oncology

## 2018-08-10 DIAGNOSIS — Z510 Encounter for antineoplastic radiation therapy: Secondary | ICD-10-CM | POA: Diagnosis not present

## 2018-08-10 DIAGNOSIS — C50412 Malignant neoplasm of upper-outer quadrant of left female breast: Secondary | ICD-10-CM | POA: Diagnosis not present

## 2018-08-10 DIAGNOSIS — Z923 Personal history of irradiation: Secondary | ICD-10-CM

## 2018-08-10 DIAGNOSIS — Z170 Estrogen receptor positive status [ER+]: Secondary | ICD-10-CM | POA: Diagnosis not present

## 2018-08-10 HISTORY — DX: Personal history of irradiation: Z92.3

## 2018-08-12 NOTE — Progress Notes (Signed)
  Radiation Oncology         [phone] ________________________________  Name[facility] MRN: [id number]  Date: 08/10/2018  DOB: 07/18/1976  End of Treatment Note  Diagnosis:   Stage I invasive ductal carcinoma of the left breast (pT1c, pN0)     Indication for treatment:  Curative       Radiation treatment dates:   06/24/2018 to 08/10/2018  Site/dose:    1. The Left breast was treated to 50.4 Gy in 28 fractions of 1.8 Gy. 2. The Left breast was boosted to 10 Gy in 5 fractions of 2 Gy.  Beams/energy:    1. 3D // 10X, 6X 2. 3D // 6X  Narrative: The patient tolerated radiation treatment relatively well. Towards the end of treatment she did develop a significant reaction which responded well to gentian violet.  The patient's skin improved with gentian violet and has no significant moist desquamation In the inframammary fold or axillary region at last PUT visit (11/25). She reports dull pains in her breast area. She also reports mild fatigue. States she is using gentian violet and aquaphor for skin.   Plan: The patient has completed radiation treatment. The patient will return to radiation oncology clinic for routine followup in one month or sooner depending on skin issues. I advised them to call or return sooner if they have any questions or concerns related to their recovery or treatment.  -----------------------------------  Blair Promise, PhD, MD  This document serves as a record of services personally performed by [name], MD. It was created on his behalf by Arlyce Harman, a trained medical scribe. The creation of this record is based on the scribe's personal observations and the provider's statements to them. This document has been checked and approved by the attending provider.

## 2018-08-29 DIAGNOSIS — E782 Mixed hyperlipidemia: Secondary | ICD-10-CM | POA: Diagnosis not present

## 2018-08-29 DIAGNOSIS — D509 Iron deficiency anemia, unspecified: Secondary | ICD-10-CM | POA: Diagnosis not present

## 2018-08-29 DIAGNOSIS — D510 Vitamin B12 deficiency anemia due to intrinsic factor deficiency: Secondary | ICD-10-CM | POA: Diagnosis not present

## 2018-09-01 DIAGNOSIS — E782 Mixed hyperlipidemia: Secondary | ICD-10-CM | POA: Diagnosis not present

## 2018-09-01 DIAGNOSIS — D509 Iron deficiency anemia, unspecified: Secondary | ICD-10-CM | POA: Diagnosis not present

## 2018-09-03 ENCOUNTER — Other Ambulatory Visit: Payer: Self-pay

## 2018-09-03 ENCOUNTER — Encounter: Payer: Self-pay | Admitting: Radiation Oncology

## 2018-09-03 ENCOUNTER — Ambulatory Visit
Admission: RE | Admit: 2018-09-03 | Discharge: 2018-09-03 | Disposition: A | Discharge status: Home / Self Care | Payer: 59 | Source: Ambulatory Visit | Attending: Radiation Oncology | Admitting: Radiation Oncology

## 2018-09-03 VITALS — BP 122/63 | HR 88 | Temp 98.5°F | Resp 18 | Wt 143.0 lb

## 2018-09-03 DIAGNOSIS — Z79899 Other long term (current) drug therapy: Secondary | ICD-10-CM | POA: Insufficient documentation

## 2018-09-03 DIAGNOSIS — Z79810 Long term (current) use of selective estrogen receptor modulators (SERMs): Secondary | ICD-10-CM | POA: Insufficient documentation

## 2018-09-03 DIAGNOSIS — C50412 Malignant neoplasm of upper-outer quadrant of left female breast: Secondary | ICD-10-CM | POA: Diagnosis not present

## 2018-09-03 DIAGNOSIS — Z923 Personal history of irradiation: Secondary | ICD-10-CM | POA: Insufficient documentation

## 2018-09-03 DIAGNOSIS — Z170 Estrogen receptor positive status [ER+]: Secondary | ICD-10-CM | POA: Diagnosis not present

## 2018-09-03 MED FILL — OSELTAMIVIR PHOSPHATE 75 MG: 75 | 10 days supply | Qty: 10 | Fill #0

## 2018-09-03 NOTE — Progress Notes (Signed)
Radiation Oncology         [phone]) [phone] ________________________________  Name: Anne Horton [facility] MRN: [id number]  Date: 09/03/2018  DOB: 27-Nov-1975  Follow-Up Visit Note  CC: [name], MD    ICD-10-CM   1. Malignant neoplasm of upper-outer quadrant of left breast in female, estrogen receptor positive ([place]) C50.412    Z17.0     Diagnosis:   Stage I invasive ductal carcinoma of the left breast (pT1c, pN0)   Interval Since Last Radiation:  1 months  06/24/2018 to 08/10/2018  Site/dose:    1. The Left breast was treated to 50.4 Gy in 28 fractions of 1.8 Gy. 2. The Left breast lumpectomy cavity was boosted to 10 Gy in 5 fractions of 2 Gy.  Narrative:  The patient returns today for routine follow-up.  she is doing well overall. She is unaccompanied. She is receiving care through medical oncology and starts Tamoxifen December 26. She reports her husband currently has the flu.       On review of systems, she reports some lingering minor swelling. she denies pain in the breast and any other symptoms. Pertinent positives are listed and detailed within the above HPI.                 ALLERGIES:  is allergic to latex.  Meds: Current Outpatient Medications  Medication Sig Dispense Refill  . acetaminophen (TYLENOL) 500 MG tablet Take 500 mg by mouth every 6 (six) hours as needed.    . ALPRAZolam (XANAX) 0.5 MG tablet Take 0.5 mg by mouth at bedtime as needed (FOR ANXIETY). :take 1/2 to 1 twice a day as needed    . cetirizine (ZYRTEC) 10 MG tablet Take 10 mg by mouth as needed for allergies.     . DULoxetine (CYMBALTA) 60 MG capsule Take 60 mg by mouth daily.     Marland Kitchen ibuprofen (ADVIL,MOTRIN) 200 MG tablet Take 800 mg by mouth daily as needed for headache.    . metoprolol succinate (TOPROL-XL) 50 MG 24 hr tablet TAKE 1 TABLET BY MOUTH DAILY. TAKE WITH OR IMMEDIATELY FOLLOWING A MEAL. 90 tablet 3  . oxyCODONE (OXY IR/ROXICODONE) 5 MG immediate release tablet  Take 1 tablet (5 mg total) by mouth every 6 (six) hours as needed for moderate pain, severe pain or breakthrough pain. 30 tablet 0  . prochlorperazine (COMPAZINE) 10 MG tablet Take 1 tablet (10 mg total) by mouth every 6 (six) hours as needed for nausea or vomiting. 30 tablet 0  . propranolol (INDERAL) 10 MG tablet TAKE 1 TABLET BY MOUTH FOUR TIMES DAILY AS NEEDED FOR PALPITATIONS 120 tablet 3  . [START ON 09/10/2018] tamoxifen (NOLVADEX) 20 MG tablet Take 1 tablet (20 mg total) by mouth daily. 90 tablet 3   No current facility-administered medications for this encounter.     Physical Findings: The patient is in no acute distress. Patient is alert and oriented.  weight is 143 lb (64.9 kg). Her oral temperature is 98.5 F (36.9 C). Her blood pressure is 122/63 and her pulse is 88. Her respiration is 18 and oxygen saturation is 99%. .  No significant changes. Lungs are clear to auscultation bilaterally. Heart has regular rate and rhythm. No palpable cervical, supraclavicular, or axillary adenopathy. Abdomen soft, non-tender, normal bowel sounds. Left breast the skin has healed well, some mild edema and hyperpigmentation changes to the breast. No palpable mass in the breast area.   Lab Findings: Lab Results  Component  Value Date   WBC 7.3 08/03/2018   HGB 13.2 08/03/2018   HCT 42.1 08/03/2018   MCV 84.0 08/03/2018   PLT 404 (H) 08/03/2018    Radiographic Findings: No results found.  Impression:  The patient is recovering from the effects of radiation.  She has minimal fatigue at this time. She is back to work full-time. No evidence of recurrence on exam.  Plan:   She will follow-up with radiation oncology in 3 months. She will start Tamoxifen later this month (Dec 26).  ____________________________________   Blair Promise, PhD, MD    This document serves as a record of services personally performed by [name], MD. It was created on his behalf by Mary-Margaret Loma Messing, a  trained medical scribe. The creation of this record is based on the scribe's personal observations and the provider's statements to them. This document has been checked and approved by the attending provider.

## 2018-09-03 NOTE — Progress Notes (Signed)
Anne Horton presents today for f/u with Dr. Sondra Come. Pt reports fatigue is much improved. Pt denies c/o pain. Pt was re-educated on importance of using vitamin E oil/lotion on breast. Breast is pink, skin is intact, breast is swollen.  BP 122/63 (BP Location: Right Arm, Patient Position: Sitting)   Pulse 88   Temp 98.5 F (36.9 C) (Oral)   Resp 18   Wt 143 lb (64.9 kg)   SpO2 99%   BMI 26.16 kg/m   Wt Readings from Last 3 Encounters:  09/03/18 143 lb (64.9 kg)  08/03/18 142 lb 8 oz (64.6 kg)  06/01/18 144 lb (65.3 kg)   Loma Sousa, RN BSN

## 2018-09-11 MED FILL — TAMOXIFEN CITRATE 20 MG TAB: 20 | 90 days supply | Qty: 90 | Fill #0

## 2018-10-13 MED FILL — METOPROLOL SUCCINATE ER 50: 50 | 90 days supply | Qty: 90 | Fill #2

## 2018-10-27 DIAGNOSIS — F321 Major depressive disorder, single episode, moderate: Secondary | ICD-10-CM | POA: Diagnosis not present

## 2018-10-27 MED FILL — DULoxetine HCL 30 MG CPEP: 30 | 7 days supply | Qty: 7 | Fill #0

## 2018-10-27 MED FILL — ALPRAZolam 0.5 MG TABS: 0.5 | 30 days supply | Qty: 60 | Fill #0

## 2018-10-27 MED FILL — VENLAFAXINE HCL ER 75 MG CA: 75 | 48 days supply | Qty: 90 | Fill #0

## 2018-10-27 MED FILL — VENLAFAXINE HCL ER 37.5 MG: 37.5 | 7 days supply | Qty: 7 | Fill #0

## 2018-11-10 ENCOUNTER — Ambulatory Visit
Admission: RE | Admit: 2018-11-10 | Discharge: 2018-11-10 | Disposition: A | Discharge status: Home / Self Care | Payer: 59 | Source: Ambulatory Visit | Attending: Radiation Oncology | Admitting: Radiation Oncology

## 2018-11-10 ENCOUNTER — Other Ambulatory Visit: Payer: Self-pay

## 2018-11-10 ENCOUNTER — Encounter: Payer: Self-pay | Admitting: Radiation Oncology

## 2018-11-10 VITALS — BP 105/51 | HR 82 | Temp 98.7°F | Resp 18 | Ht 62.0 in | Wt 139.0 lb

## 2018-11-10 DIAGNOSIS — C50412 Malignant neoplasm of upper-outer quadrant of left female breast: Secondary | ICD-10-CM | POA: Insufficient documentation

## 2018-11-10 DIAGNOSIS — Z79899 Other long term (current) drug therapy: Secondary | ICD-10-CM | POA: Insufficient documentation

## 2018-11-10 DIAGNOSIS — Z170 Estrogen receptor positive status [ER+]: Secondary | ICD-10-CM

## 2018-11-10 NOTE — Progress Notes (Signed)
Pt presents today for work in f/u appt with Dr. Sondra Come. Pt reports swelling in breast has not started to resolve and at times swelling is worsening. Pt reports some pain in breast, mostly attributed to swelling. Pt is using cocoa butter on breast. Breast is hyperpigmented and swollen. Bra is causing indention at seams to swelling. Pt reports fatigue is improving since radiation.   BP (!) 105/51 (BP Location: Right Arm, Patient Position: Sitting)   Pulse 82   Temp 98.7 F (37.1 C) (Oral)   Resp 18   Ht 5\' 2"  (1.575 m)   Wt 139 lb (63 kg)   SpO2 100%   BMI 25.42 kg/m   Wt Readings from Last 3 Encounters:  11/10/18 139 lb (63 kg)  09/03/18 143 lb (64.9 kg)  08/03/18 142 lb 8 oz (64.6 kg)   Loma Sousa, RN BSN

## 2018-11-10 NOTE — Progress Notes (Signed)
Radiation Oncology         [phone] ________________________________  Name: [facility] MRN: [id number]  Date: 11/10/2018  DOB: Dec 09, 1975  Follow-Up Visit Note  CC: [name], MD    ICD-10-CM   1. Malignant neoplasm of upper-outer quadrant of left breast in female, estrogen receptor positive ([place]) C50.412 Ambulatory referral to Physical Therapy   Z17.0     Diagnosis:  Stage I invasive ductal carcinoma of the left breast (pT1c, pN0)      Interval Since Last Radiation:  3 months  Narrative:  The patient returns today for unscheduled follow-up. The patient has noticed more swelling and discomfort within the left breast area. She denies any trauma to the left breast nipple discharge or bleeding. She denies any chills or fever. She admits to not being consistent with her exercises were recommended by physical therapy. When she was seen before by physical therapy they primarily focused on shoulder mobility and potential for lymphedema in the left arm. She denies starting any new medication or unusual exercise program. 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 She Is taking tamoxifen and tolerating this well. She denies any hot flashes or night sweats. The discomfort in the left breast is not keeping her awake at night. She is not requiring any pain medication for this issue                              ALLERGIES:  is allergic to latex.  Meds: Current Outpatient Medications  Medication Sig Dispense Refill  . acetaminophen (TYLENOL) 500 MG tablet Take 500 mg by mouth every 6 (six) hours as needed.    . ALPRAZolam (XANAX) 0.5 MG tablet Take 0.5 mg by mouth at bedtime as needed (FOR ANXIETY). :take 1/2 to 1 twice a day as needed    . cetirizine (ZYRTEC) 10 MG tablet Take 10 mg by mouth as needed for allergies.     Marland Kitchen ibuprofen (ADVIL,MOTRIN) 200 MG tablet Take 800 mg by mouth daily as needed for headache.    . metoprolol succinate  (TOPROL-XL) 50 MG 24 hr tablet TAKE 1 TABLET BY MOUTH DAILY. TAKE WITH OR IMMEDIATELY FOLLOWING A MEAL. 90 tablet 3  . oxyCODONE (OXY IR/ROXICODONE) 5 MG immediate release tablet Take 1 tablet (5 mg total) by mouth every 6 (six) hours as needed for moderate pain, severe pain or breakthrough pain. 30 tablet 0  . prochlorperazine (COMPAZINE) 10 MG tablet Take 1 tablet (10 mg total) by mouth every 6 (six) hours as needed for nausea or vomiting. 30 tablet 0  . propranolol (INDERAL) 10 MG tablet TAKE 1 TABLET BY MOUTH FOUR TIMES DAILY AS NEEDED FOR PALPITATIONS 120 tablet 3  . tamoxifen (NOLVADEX) 20 MG tablet Take 1 tablet (20 mg total) by mouth daily. 90 tablet 3  . venlafaxine XR (EFFEXOR-XR) 150 MG 24 hr capsule Take 150 mg by mouth daily with breakfast.     No current facility-administered medications for this encounter.     Physical Findings: The patient is in no acute distress. Patient is alert and oriented.  height is 5\' 2"  (1.575 m) and weight is 139 lb (63 kg). Her oral temperature is 98.7 F (37.1 C). Her blood pressure is 105/51 (abnormal) and her pulse is 82. Her respiration is 18 and oxygen saturation is 100%. .  Lungs are clear to auscultation bilaterally. Heart has regular rate and rhythm.  No palpable cervical, supraclavicular, or axillary adenopathy. Abdomen soft, non-tender, normal bowel sounds. Examination of the right breast reveals it to be large and pendulous without mass or nipple discharge. Examination of the left breast reveals it to be large and pendulous without mass or nipple discharge.The left breast area shows some hyperpigmentation changes. She does have significant diffuse edema in the breast. No nipple discharge or bleeding.  Lab Findings: Lab Results  Component Value Date   WBC 7.3 08/03/2018   HGB 13.2 08/03/2018   HCT 42.1 08/03/2018   MCV 84.0 08/03/2018   PLT 404 (H) 08/03/2018    Radiographic Findings: No results found.  Impression:  No evidence  recurrence on clinical exam. The patient has developed more edema in the breast. This is causing discomfort for the patient. I recommended she proceed with formal physical therapy evaluation for left breast edema. She will likely benefit from manual massage exercises.  Plan:  . Order has been placed today for physical therapy evaluation concerning her left breast edema. Patient was scheduled for follow-up next month and this will be moved back until April after she has undergone physical therapy.  ____________________________________ [name], MD

## 2018-11-20 ENCOUNTER — Ambulatory Visit: Payer: 59 | Attending: Radiation Oncology | Admitting: Rehabilitation

## 2018-11-20 ENCOUNTER — Other Ambulatory Visit: Payer: Self-pay

## 2018-11-20 ENCOUNTER — Encounter: Payer: Self-pay | Admitting: Rehabilitation

## 2018-11-20 DIAGNOSIS — M25512 Pain in left shoulder: Secondary | ICD-10-CM | POA: Diagnosis not present

## 2018-11-20 DIAGNOSIS — M25612 Stiffness of left shoulder, not elsewhere classified: Secondary | ICD-10-CM | POA: Diagnosis not present

## 2018-11-20 DIAGNOSIS — Z483 Aftercare following surgery for neoplasm: Secondary | ICD-10-CM | POA: Insufficient documentation

## 2018-11-20 DIAGNOSIS — L599 Disorder of the skin and subcutaneous tissue related to radiation, unspecified: Secondary | ICD-10-CM | POA: Insufficient documentation

## 2018-11-20 DIAGNOSIS — I890 Lymphedema, not elsewhere classified: Secondary | ICD-10-CM | POA: Insufficient documentation

## 2018-11-20 NOTE — Therapy (Addendum)
[place], [place], [address] Phone: [phone]   Fax:  [phone]  Physical Therapy Evaluation  Patient Details  Name: Anne Horton MRN: [id number] Date of Birth: [birth year] Referring Provider (PT): Dr. Sondra Come   Encounter Date: 11/20/2018  PT End of Session - 11/20/18 0847    Visit Number  1    Number of Visits  13    Date for PT Re-Evaluation  01/01/19    Authorization Type  cone insurance ; medical necessity    Activity Tolerance  Patient tolerated treatment well    Behavior During Therapy  [facility] for tasks assessed/performed       Past Medical History:  Diagnosis Date  . Allergy   . Anemia   . Anxiety   . Cancer ([place])   . Chest pain   . Depression   . Family history of breast cancer   . Fibromyalgia    neurofibromatosis  . H/O: depression   . Hx of migraines   . Mitral regurgitation    mild per echo EF 55-60%  . Neurofibromatosis ([place])   . Palpitations   . Tachycardia    Hx. of  . Wears contact lenses     Past Surgical History:  Procedure Laterality Date  . BREAST LUMPECTOMY WITH RADIOACTIVE SEED AND SENTINEL LYMPH NODE BIOPSY Left 05/07/2018   Procedure: BREAST LUMPECTOMY WITH RADIOACTIVE SEED AND SENTINEL LYMPH NODE BIOPSY;  Surgeon: Rolm Bookbinder, MD;  Location: [place];  Service: General;  Laterality: Left;  . CESAREAN SECTION    . CHOLECYSTECTOMY N/A 01/04/2015   Procedure: LAPAROSCOPIC CHOLECYSTECTOMY WITH INTRAOPERATIVE CHOLANGIOGRAM;  Surgeon: Erroll Luna, MD;  Location: [place];  Service: General;  Laterality: N/A;  . COLONOSCOPY    . ESOPHAGOGASTRODUODENOSCOPY    . WISDOM TOOTH EXTRACTION      There were no vitals filed for this visit.   Subjective Assessment - 11/20/18 0806    Subjective  Has now completed the radiation with 2nd degree burns the skin is doing better but still tender.  It started swelling during radition  and is now getting a little bit worse.  Back to compression bras     Pertinent History  05/07/18 Left lumpectomy: IDC grade 1, 1.2 cm, intermediate grade DCIS, margins negative,PASH, 0/7 sentinel lymph nodes negative, ER 95%, PR 95%, Ki-67 1%, HER-2 negative ratio 1.47, T1CN0 stage Ia, no chemo needed, radiation completed 08/10/18 with boost added, firomyalgia, depression    Limitations  --   none   Patient Stated Goals  get rid of swelling    Currently in Pain?  No/denies    Pain Score  --   up to a 3-4/10 at the end of the day        [facility] PT Assessment - 11/20/18 0001      Assessment   Medical Diagnosis  Lt breast cancer    Referring Provider (PT)  Dr. Sondra Come    Onset Date/Surgical Date  08/05/18    Hand Dominance  Right    Next MD Visit  --   end of april   Prior Therapy  yes for shoulder      Precautions   Precaution Comments  cancer, lymphedema Lt risk      Restrictions   Weight Bearing Restrictions  No      Balance Screen   Has the patient fallen in the past 6 months  No    Has the patient had  a decrease in activity level because of a fear of falling?   No    Is the patient reluctant to leave their home because of a fear of falling?   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 [employer]  I did the strength ABC for awhile and then stopped       Cognition   Overall Cognitive Status  Within Functional Limits for tasks assessed      Observation/Other Assessments   Observations  Lt breast swollen and skin dark   see photo   Skin Integrity  fibrosis inferior breast and slightly towards breast incision      Coordination   Gross Motor Movements are Fluid and Coordinated  Yes      ROM / Strength   AROM / PROM / Strength  --   pt  reports shoulder is still feeling fine from previously       LYMPHEDEMA/ONCOLOGY QUESTIONNAIRE - 11/20/18 0845      Type   Cancer Type  Lt breast cancer      Surgeries   Lumpectomy Date  05/07/18    Sentinel Lymph Node Biopsy Date  05/07/18    Number Lymph Nodes Removed  7      Date Lymphedema/Swelling Started   Date  08/06/19      Treatment   Active Chemotherapy Treatment  No    Past Chemotherapy Treatment  No    Active Radiation Treatment  No    Past Radiation Treatment  Yes    Current Hormone Treatment  No    Past Hormone Therapy  No      What other symptoms do you have   Are you Having Heaviness or Tightness  Yes    Are you having Pain  Yes    Is it Hard or Difficult finding clothes that fit  Yes   bras   Other Symptoms  UEs not assessed as pt has sleeve and guantllet from prior visits and reports no swelling currently may assess in future visit               Objective measurements completed on examination: See above findings.      [facility] Adult PT Treatment/Exercise - 11/20/18 0001      Manual Therapy   Manual Therapy  Manual Lymphatic Drainage (MLD);Edema management    Edema Management  gave pt 1/2" gray foam rectangle for the inferior breast    Manual Lymphatic Drainage (MLD)  short session of Lt breast MLD with education on pathway and reason for the sequence.  pt gives conset for manual breast work verbally.  deep breathing, deep abdominals, short neck, Lt and Rt axillary nodes, Lt inguinal nodes, interaxillary pathway, Lt axillo inguinal pathway, then stationary circles at the breast in supine and sidelying with brief self MLD to the breast for home              PT Education - 11/20/18 0847    Education Details  POC, MLD and how it works    Forensic psychologist) Educated  Patient    Methods  Explanation;Demonstration;Verbal cues    Comprehension  Verbalized understanding;Returned demonstration;Verbal cues required  PT Long Term Goals -  11/20/18 0900      PT LONG TERM GOAL #1   Title  Pt will decrease breast size by at least 50% subjectively and per picture    Time  6    Period  Weeks    Status  New      PT LONG TERM GOAL #2   Title  Pt will report no breast pain at the end of the day    Time  6    Period  Weeks      PT LONG TERM GOAL #3   Title  pt will ind with self breast MLD    Time  6    Period  Weeks    Status  New             Plan - 11/20/18 8003    Clinical Impression Statement  pt returns to PT here for treatment of the Lt breast swelling that started during radiation.  Significant global breast swelling present with some fibrosis inferiory and medially and some scar tissue adhesion at the breast inicision laterally.  Pt denies shoulder limitations or arm swelling currently.  already has 4 good compression bras that have been helping.      Personal Factors and Comorbidities  Comorbidity 1    Comorbidities  radiation history    Stability/Clinical Decision Making  Evolving/Moderate complexity   new swelling   Clinical Decision Making  Low    Rehab Potential  Excellent    PT Frequency  2x / week    PT Duration  6 weeks    PT Treatment/Interventions  Manual lymph drainage;Manual techniques;Taping;Patient/family education;Therapeutic exercise;ADLs/Self Care Home Management   pt needs taping trial before tpaing due to some tape allergies with surgical tape   PT Next Visit Plan  continue Lt breast MLD, how was the foam? need a swell spot or something different?  tape trial for potential KT?, start to teach self MLD    Consulted and Agree with Plan of Care  Patient       Patient will benefit from skilled therapeutic intervention in order to improve the following deficits and impairments:  Pain, Increased edema  Visit Diagnosis: Lymphedema, not elsewhere classified  Disorder of the skin and subcutaneous tissue related to radiation, unspecified     Problem List Patient Active Problem List    Diagnosis Date Noted  . Genetic testing 04/16/2018  . Malignant neoplasm of upper-outer quadrant of left breast in female, estrogen receptor positive ([place]) 03/25/2018  . Family history of breast cancer 03/24/2018  . Anemia, iron deficiency 10/20/2014  . Chest pain   . Palpitations   . Neurofibromatosis ([place])   . Tachycardia   . Hx of migraines   . H/O: depression   . Mitral regurgitation   . BLOOD IN STOOL 01/12/2009  . BLOOD IN STOOL, OCCULT 01/12/2009    Shan Levans, PT 11/20/2018, [place], [place], [address] Phone: [phone]   Fax:  [phone]  Name: KENLIE SEKI MRN: [id number] Date of Birth: 11-Dec-1975

## 2018-11-20 NOTE — Patient Instructions (Signed)
Wear foam in bra Try sleeping in the bra Stationary circles to the breast for now

## 2018-11-23 ENCOUNTER — Ambulatory Visit: Payer: 59 | Admitting: Rehabilitation

## 2018-11-23 ENCOUNTER — Encounter: Payer: Self-pay | Admitting: Rehabilitation

## 2018-11-23 DIAGNOSIS — L599 Disorder of the skin and subcutaneous tissue related to radiation, unspecified: Secondary | ICD-10-CM | POA: Diagnosis not present

## 2018-11-23 DIAGNOSIS — I890 Lymphedema, not elsewhere classified: Secondary | ICD-10-CM | POA: Diagnosis not present

## 2018-11-23 DIAGNOSIS — M25612 Stiffness of left shoulder, not elsewhere classified: Secondary | ICD-10-CM | POA: Diagnosis not present

## 2018-11-23 DIAGNOSIS — Z483 Aftercare following surgery for neoplasm: Secondary | ICD-10-CM | POA: Diagnosis not present

## 2018-11-23 DIAGNOSIS — M25512 Pain in left shoulder: Secondary | ICD-10-CM | POA: Diagnosis not present

## 2018-11-23 NOTE — Therapy (Signed)
[place], [place], [address] Phone: [phone]   Fax:  [phone]  Physical Therapy Treatment  Patient Details  Name: Anne Horton MRN: [id number] Date of Birth: January 11, 1976 Referring Provider (PT): Dr. Sondra Come   Encounter Date: 11/23/2018  PT End of Session - 11/23/18 1156    Visit Number  2    Number of Visits  13    Date for PT Re-Evaluation  01/01/19    Authorization Type  cone insurance ; medical necessity    PT Start Time  1103    PT Stop Time  1146    PT Time Calculation (min)  43 min    Activity Tolerance  Patient tolerated treatment well    Behavior During Therapy  [facility] for tasks assessed/performed       Past Medical History:  Diagnosis Date  . Allergy   . Anemia   . Anxiety   . Cancer ([place])   . Chest pain   . Depression   . Family history of breast cancer   . Fibromyalgia    neurofibromatosis  . H/O: depression   . Hx of migraines   . Mitral regurgitation    mild per echo EF 55-60%  . Neurofibromatosis ([place])   . Palpitations   . Tachycardia    Hx. of  . Wears contact lenses     Past Surgical History:  Procedure Laterality Date  . BREAST LUMPECTOMY WITH RADIOACTIVE SEED AND SENTINEL LYMPH NODE BIOPSY Left 05/07/2018   Procedure: BREAST LUMPECTOMY WITH RADIOACTIVE SEED AND SENTINEL LYMPH NODE BIOPSY;  Surgeon: Rolm Bookbinder, MD;  Location: [place];  Service: General;  Laterality: Left;  . CESAREAN SECTION    . CHOLECYSTECTOMY N/A 01/04/2015   Procedure: LAPAROSCOPIC CHOLECYSTECTOMY WITH INTRAOPERATIVE CHOLANGIOGRAM;  Surgeon: Erroll Luna, MD;  Location: [place];  Service: General;  Laterality: N/A;  . COLONOSCOPY    . ESOPHAGOGASTRODUODENOSCOPY    . WISDOM TOOTH EXTRACTION      There were no vitals filed for this visit.  Subjective Assessment - 11/23/18 1103    Subjective  nothing new to report. I used the foam at night      Pertinent History  05/07/18 Left lumpectomy: IDC grade 1, 1.2 cm, intermediate grade DCIS, margins negative,PASH, 0/7 sentinel lymph nodes negative, ER 95%, PR 95%, Ki-67 1%, HER-2 negative ratio 1.47, T1CN0 stage Ia, no chemo needed, radiation completed 08/10/18 with boost added, firomyalgia, depression    Patient Stated Goals  get rid of swelling    Currently in Pain?  No/denies                       [facility] Adult PT Treatment/Exercise - 11/23/18 0001      Manual Therapy   Manual therapy comments  kinsiotape trial to the lateral breast to see if pt will have sensitivity     Manual Lymphatic Drainage (MLD)  session focused on self MLD for the left breast with pt perfomring the whole sequence with cueing as needed. Hand over hand for the breast for pressure and pathway. Then PT peformed more MLD to the Left breast insupine and sidelying and some incision work in [place]             PT Education - 11/23/18 1155    Education Details  MLD sequence and technique for the breast    Person(s) Educated  Patient    Methods  Explanation;Demonstration;Tactile cues;Verbal  cues;Handout    Comprehension  Verbalized understanding;Returned demonstration;Verbal cues required;Tactile cues required          PT Long Term Goals - 11/20/18 0900      PT LONG TERM GOAL #1   Title  Pt will decrease breast size by at least 50% subjectively and per picture    Time  6    Period  Weeks    Status  New      PT LONG TERM GOAL #2   Title  Pt will report no breast pain at the end of the day    Time  6    Period  Weeks      PT LONG TERM GOAL #3   Title  pt will ind with self breast MLD    Time  6    Period  Weeks    Status  New              Patient will benefit from skilled therapeutic intervention in order to improve the following deficits and impairments:     Visit Diagnosis: Lymphedema, not elsewhere classified     Problem List Patient Active Problem List    Diagnosis Date Noted  . Genetic testing 04/16/2018  . Malignant neoplasm of upper-outer quadrant of left breast in female, estrogen receptor positive ([place]) 03/25/2018  . Family history of breast cancer 03/24/2018  . Anemia, iron deficiency 10/20/2014  . Chest pain   . Palpitations   . Neurofibromatosis ([place])   . Tachycardia   . Hx of migraines   . H/O: depression   . Mitral regurgitation   . BLOOD IN STOOL 01/12/2009  . BLOOD IN [place], OCCULT 01/12/2009    Shan Levans, PT 11/23/2018, 12:00 PM  [place], [place], [address] Phone: [phone]   Fax:  [phone]  Name: Anne Horton MRN: [id number] Date of Birth: 19-Feb-1976

## 2018-11-23 NOTE — Patient Instructions (Addendum)
Self manual lymph drainage: Perform this sequence once a day.  Only give enough pressure no your skin to make the skin move.  Diaphragmatic - Supine   Inhale through nose making navel move out toward hands. Exhale through puckered lips, hands follow navel in. Repeat _5__ times. Rest _10__ seconds between repeats.   Copyright  VHI. All rights reserved.  Hug yourself.  Do circles at your neck just above your collarbones.  Repeat this 10 times.  Axilla - One at a Time   Using full weight of flat hand and fingers at center of uninvolved armpit, make _10__ in-place circles.   Copyright  VHI. All rights reserved.  LEG: Inguinal Nodes Stimulation   With small finger side of hand against hip crease on involved side, gently perform circles at the crease. Repeat __10_ times.   Copyright  VHI. All rights reserved.  1) Axilla to Inguinal Nodes - Sweep   On involved side, sweep _4__ times from armpit along side of trunk to hip crease.  Now gently stretch skin from the involved side to the uninvolved side across the chest at the shoulder line.  Repeat that 4 times.  Draw an imaginary diagonal line from upper outer breast through the nipple area toward lower inner breast.  Direct fluid upward and inward from this line toward the pathway across your upper chest .  Do this in three rows to treat all of the upper inner breast tissue, and do each row 3-4x.      Direct fluid to treat all of lower outer breast tissue downward and outward toward      pathway that is aimed at the left groin.  Finish by doing the pathways as described above going from your involved armpit to the same side groin and going across your upper chest from the involved shoulder to the uninvolved shoulder.  Repeat the steps above where you do circles in your left groin and right armpit. Copyright  VHI. All rights reserved.    How to order a swell spot online

## 2018-11-24 ENCOUNTER — Ambulatory Visit: Payer: 59

## 2018-11-24 DIAGNOSIS — M25612 Stiffness of left shoulder, not elsewhere classified: Secondary | ICD-10-CM | POA: Diagnosis not present

## 2018-11-24 DIAGNOSIS — L599 Disorder of the skin and subcutaneous tissue related to radiation, unspecified: Secondary | ICD-10-CM

## 2018-11-24 DIAGNOSIS — M25512 Pain in left shoulder: Secondary | ICD-10-CM | POA: Diagnosis not present

## 2018-11-24 DIAGNOSIS — I890 Lymphedema, not elsewhere classified: Secondary | ICD-10-CM

## 2018-11-24 DIAGNOSIS — Z483 Aftercare following surgery for neoplasm: Secondary | ICD-10-CM | POA: Diagnosis not present

## 2018-11-24 NOTE — Therapy (Signed)
[place], [place], [address] Phone: [phone]   Fax:  [phone]  Physical Therapy Treatment  Patient Details  Name: Anne Horton MRN: [id number] Date of Birth: 1976/04/21 Referring Provider (PT): Dr. Sondra Come   Encounter Date: 11/24/2018  PT End of Session - 11/24/18 0851    Visit Number  3    Number of Visits  13    Date for PT Re-Evaluation  01/01/19    Authorization Type  cone insurance ; medical necessity    PT Start Time  0802    PT Stop Time  6644    PT Time Calculation (min)  45 min    Activity Tolerance  Patient tolerated treatment well    Behavior During Therapy  [facility] for tasks assessed/performed       Past Medical History:  Diagnosis Date  . Allergy   . Anemia   . Anxiety   . Cancer ([place])   . Chest pain   . Depression   . Family history of breast cancer   . Fibromyalgia    neurofibromatosis  . H/O: depression   . Hx of migraines   . Mitral regurgitation    mild per echo EF 55-60%  . Neurofibromatosis ([place])   . Palpitations   . Tachycardia    Hx. of  . Wears contact lenses     Past Surgical History:  Procedure Laterality Date  . BREAST LUMPECTOMY WITH RADIOACTIVE SEED AND SENTINEL LYMPH NODE BIOPSY Left 05/07/2018   Procedure: BREAST LUMPECTOMY WITH RADIOACTIVE SEED AND SENTINEL LYMPH NODE BIOPSY;  Surgeon: Rolm Bookbinder, MD;  Location: [place];  Service: General;  Laterality: Left;  . CESAREAN SECTION    . CHOLECYSTECTOMY N/A 01/04/2015   Procedure: LAPAROSCOPIC CHOLECYSTECTOMY WITH INTRAOPERATIVE CHOLANGIOGRAM;  Surgeon: Erroll Luna, MD;  Location: [place];  Service: General;  Laterality: N/A;  . COLONOSCOPY    . ESOPHAGOGASTRODUODENOSCOPY    . WISDOM TOOTH EXTRACTION      There were no vitals filed for this visit.  Subjective Assessment - 11/24/18 0806    Subjective  Was just here yesterday so nothing new since then  except I've been wearing the foam and I like it and feel like it's helping some.     Pertinent History  05/07/18 Left lumpectomy: IDC grade 1, 1.2 cm, intermediate grade DCIS, margins negative,PASH, 0/7 sentinel lymph nodes negative, ER 95%, PR 95%, Ki-67 1%, HER-2 negative ratio 1.47, T1CN0 stage Ia, no chemo needed, radiation completed 08/10/18 with boost added, firomyalgia, depression    Patient Stated Goals  get rid of swelling    Currently in Pain?  Yes    Pain Score  3     Pain Location  Breast    Pain Orientation  Left    Pain Descriptors / Indicators  Aching    Pain Type  Acute pain    Pain Onset  More than a month ago    Pain Frequency  Constant    Aggravating Factors   maybe exercises made me a little sore    Pain Relieving Factors  nothing yet                       [facility] Adult PT Treatment/Exercise - 11/24/18 0001      Manual Therapy   Manual Therapy  Manual Lymphatic Drainage (MLD);Taping    Manual Lymphatic Drainage (MLD)  In Supine: Short neck, superficial and deep  abdominals (with instruction for correct deep breathing and instructed how to practice this further at home); Lt inguinal and Rt axillary nodes, Lt axillo-inguinal and anterior inter-axillary anastomosis, then focused on Lt breast reviewing pts technique throughout. Hand over hand and VCs required for much lighter pressure.     Kinesiotex  Edema      Kinesiotix   Edema  Pts trial of kinesiotape yesterday went well with no reaction so reapplied today along Lt axillo-inguinal anastomosis with 2 fingers towards lateral breast. Skincote applied first.                   PT Long Term Goals - 11/20/18 0900      PT LONG TERM GOAL #1   Title  Pt will decrease breast size by at least 50% subjectively and per picture    Time  6    Period  Weeks    Status  New      PT LONG TERM GOAL #2   Title  Pt will report no breast pain at the end of the day    Time  6    Period  Weeks      PT LONG  TERM GOAL #3   Title  pt will ind with self breast MLD    Time  6    Period  Weeks    Status  New            Plan - 11/24/18 1035    Clinical Impression Statement  Continued with manual lymph drainage of Lt breast and reviewing technique with pt. She was using excessive pressure at breast, but was able to return correct demo after hand over hand guidance and VCs. Trial pof kinesiotape did not irritate pts skin so applied longer strip along Lt anastmosis which instruction to remove gently when edges begin to lift away from skin. Pt reported some relief felt of breast heaviness by end of session.     Comorbidities  radiation history    Rehab Potential  Excellent    PT Frequency  2x / week    PT Duration  6 weeks    PT Treatment/Interventions  Manual lymph drainage;Manual techniques;Taping;Patient/family education;Therapeutic exercise;ADLs/Self Care Home Management    PT Next Visit Plan  continue Lt breast MLD reviewing with pt throughout, need a swell spot or something different?  how was KT?    Consulted and Agree with Plan of Care  Patient       Patient will benefit from skilled therapeutic intervention in order to improve the following deficits and impairments:  Pain, Increased edema  Visit Diagnosis: Lymphedema, not elsewhere classified  Disorder of the skin and subcutaneous tissue related to radiation, unspecified     Problem List Patient Active Problem List   Diagnosis Date Noted  . Genetic testing 04/16/2018  . Malignant neoplasm of upper-outer quadrant of left breast in female, estrogen receptor positive ([place]) 03/25/2018  . Family history of breast cancer 03/24/2018  . Anemia, iron deficiency 10/20/2014  . Chest pain   . Palpitations   . Neurofibromatosis ([place])   . Tachycardia   . Hx of migraines   . H/O: depression   . Mitral regurgitation   . BLOOD IN STOOL 01/12/2009  . BLOOD IN [place], OCCULT 01/12/2009    Otelia Limes, PTA 11/24/2018, 10:44  AM  [place], [place], [address] Phone: [phone]   Fax:  [phone]  Name: Anne Horton [facility]  MRN: [id number] Date of Birth: Mar 27, 1976

## 2018-12-01 ENCOUNTER — Ambulatory Visit: Payer: 59 | Admitting: Physical Therapy

## 2018-12-01 ENCOUNTER — Other Ambulatory Visit: Payer: Self-pay

## 2018-12-01 ENCOUNTER — Encounter: Payer: Self-pay | Admitting: Physical Therapy

## 2018-12-01 DIAGNOSIS — I890 Lymphedema, not elsewhere classified: Secondary | ICD-10-CM

## 2018-12-01 DIAGNOSIS — L599 Disorder of the skin and subcutaneous tissue related to radiation, unspecified: Secondary | ICD-10-CM

## 2018-12-01 DIAGNOSIS — Z483 Aftercare following surgery for neoplasm: Secondary | ICD-10-CM | POA: Diagnosis not present

## 2018-12-01 DIAGNOSIS — M25612 Stiffness of left shoulder, not elsewhere classified: Secondary | ICD-10-CM | POA: Diagnosis not present

## 2018-12-01 DIAGNOSIS — M25512 Pain in left shoulder: Secondary | ICD-10-CM

## 2018-12-01 NOTE — Therapy (Signed)
[place], [place], [address] Phone: [phone]   Fax:  [phone]  Physical Therapy Treatment  Patient Details  Name: GLORIS SHIROMA MRN: [id number] Date of Birth: 1976-08-09 Referring Provider (PT): Dr. Sondra Come   Encounter Date: 12/01/2018  PT End of Session - 12/01/18 1758    Visit Number  4    Number of Visits  13    Date for PT Re-Evaluation  01/01/19    Authorization Type  cone insurance ; medical necessity    PT Start Time  1515    PT Stop Time  1600    PT Time Calculation (min)  45 min    Behavior During Therapy  [facility] for tasks assessed/performed       Past Medical History:  Diagnosis Date  . Allergy   . Anemia   . Anxiety   . Cancer ([place])   . Chest pain   . Depression   . Family history of breast cancer   . Fibromyalgia    neurofibromatosis  . H/O: depression   . Hx of migraines   . Mitral regurgitation    mild per echo EF 55-60%  . Neurofibromatosis ([place])   . Palpitations   . Tachycardia    Hx. of  . Wears contact lenses     Past Surgical History:  Procedure Laterality Date  . BREAST LUMPECTOMY WITH RADIOACTIVE SEED AND SENTINEL LYMPH NODE BIOPSY Left 05/07/2018   Procedure: BREAST LUMPECTOMY WITH RADIOACTIVE SEED AND SENTINEL LYMPH NODE BIOPSY;  Surgeon: Rolm Bookbinder, MD;  Location: [place];  Service: General;  Laterality: Left;  . CESAREAN SECTION    . CHOLECYSTECTOMY N/A 01/04/2015   Procedure: LAPAROSCOPIC CHOLECYSTECTOMY WITH INTRAOPERATIVE CHOLANGIOGRAM;  Surgeon: Erroll Luna, MD;  Location: [place];  Service: General;  Laterality: N/A;  . COLONOSCOPY    . ESOPHAGOGASTRODUODENOSCOPY    . WISDOM TOOTH EXTRACTION      There were no vitals filed for this visit.  Subjective Assessment - 12/01/18 1526    Subjective  Husband is here to learn how to do manual lymph drainage.     Pertinent History  05/07/18 Left lumpectomy:  IDC grade 1, 1.2 cm, intermediate grade DCIS, margins negative,PASH, 0/7 sentinel lymph nodes negative, ER 95%, PR 95%, Ki-67 1%, HER-2 negative ratio 1.47, T1CN0 stage Ia, no chemo needed, radiation completed 08/10/18 with boost added, firomyalgia, depression    Patient Stated Goals  get rid of swelling    Currently in Pain?  No/denies                       [facility] Adult PT Treatment/Exercise - 12/01/18 0001      Manual Therapy   Manual Therapy  Manual Lymphatic Drainage (MLD)    Manual therapy comments  Husband comes today and was instructed in basics of MLD and technique with hand over hand instruction    Edema Management  encouraged pt to wear foam and compression bra     Manual Lymphatic Drainage (MLD)  in supine,short neck, superficial and deep abdominal, right axillary nodes, anterior interaxillary anastamosis, left inguinal nodes and left axillo-inguinal anastamosis, left breast, then to right sidelying for more work on left breast and posterior interaxillary anastamosis       Kinesiotix   Edema  skinkote, 4 fan shaped kinesiotape with 2 prongs on each side of breast  PT Long Term Goals - 11/20/18 0900      PT LONG TERM GOAL #1   Title  Pt will decrease breast size by at least 50% subjectively and per picture    Time  6    Period  Weeks    Status  New      PT LONG TERM GOAL #2   Title  Pt will report no breast pain at the end of the day    Time  6    Period  Weeks      PT LONG TERM GOAL #3   Title  pt will ind with self breast MLD    Time  6    Period  Weeks    Status  New            Plan - 12/01/18 1759    Clinical Impression Statement  Pt reports she is getting good relief from treatment.  Husband came today , was attentive and was able to perform MLD technique. He also is familiar with application of kinesiotape and watched how this was applied as well. Encouraged tp to continue using compression bra and foam     PT  Next Visit Plan  continue Lt breast MLD reviewing with pt throughout, consider upgrading foam or purchase swell spot.? continue KT prior to discharge arrange for pt to get personal training at [facility] fitness centers     Consulted and Agree with Plan of Care  Patient       Patient will benefit from skilled therapeutic intervention in order to improve the following deficits and impairments:     Visit Diagnosis: Lymphedema, not elsewhere classified  Disorder of the skin and subcutaneous tissue related to radiation, unspecified  Stiffness of left shoulder, not elsewhere classified  Aftercare following surgery for neoplasm  Acute pain of left shoulder     Problem List Patient Active Problem List   Diagnosis Date Noted  . Genetic testing 04/16/2018  . Malignant neoplasm of upper-outer quadrant of left breast in female, estrogen receptor positive ([place]) 03/25/2018  . Family history of breast cancer 03/24/2018  . Anemia, iron deficiency 10/20/2014  . Chest pain   . Palpitations   . Neurofibromatosis ([place])   . Tachycardia   . Hx of migraines   . H/O: depression   . Mitral regurgitation   . BLOOD IN STOOL 01/12/2009  . BLOOD IN STOOL, OCCULT 01/12/2009   Donato Heinz. Owens Shark PT  Norwood Levo 12/01/2018, 6:03 PM  [place], [place], [address] Phone: [phone]   Fax:  [phone]  Name: SHANTAYA BLUESTONE MRN: [id number] Date of Birth: 19-Oct-1975

## 2018-12-02 ENCOUNTER — Ambulatory Visit: Payer: 59

## 2018-12-02 ENCOUNTER — Other Ambulatory Visit: Payer: Self-pay

## 2018-12-02 DIAGNOSIS — I890 Lymphedema, not elsewhere classified: Secondary | ICD-10-CM

## 2018-12-02 DIAGNOSIS — Z483 Aftercare following surgery for neoplasm: Secondary | ICD-10-CM | POA: Diagnosis not present

## 2018-12-02 DIAGNOSIS — L599 Disorder of the skin and subcutaneous tissue related to radiation, unspecified: Secondary | ICD-10-CM

## 2018-12-02 DIAGNOSIS — M25512 Pain in left shoulder: Secondary | ICD-10-CM | POA: Diagnosis not present

## 2018-12-02 DIAGNOSIS — M25612 Stiffness of left shoulder, not elsewhere classified: Secondary | ICD-10-CM | POA: Diagnosis not present

## 2018-12-02 NOTE — Therapy (Signed)
[place], [place], [address] Phone: [phone]   Fax:  [phone]  Physical Therapy Treatment  Patient Details  Name: Anne Horton MRN: [id number] Date of Birth: January 11, 1976 Referring Provider (PT): Dr. Sondra Come   Encounter Date: 12/02/2018  PT End of Session - 12/02/18 1623    Visit Number  5    Number of Visits  13    Date for PT Re-Evaluation  01/01/19    Authorization Type  cone insurance ; medical necessity    PT Start Time  1520    PT Stop Time  1602    PT Time Calculation (min)  42 min    Activity Tolerance  Patient tolerated treatment well    Behavior During Therapy  [facility] for tasks assessed/performed       Past Medical History:  Diagnosis Date  . Allergy   . Anemia   . Anxiety   . Cancer ([place])   . Chest pain   . Depression   . Family history of breast cancer   . Fibromyalgia    neurofibromatosis  . H/O: depression   . Hx of migraines   . Mitral regurgitation    mild per echo EF 55-60%  . Neurofibromatosis ([place])   . Palpitations   . Tachycardia    Hx. of  . Wears contact lenses     Past Surgical History:  Procedure Laterality Date  . BREAST LUMPECTOMY WITH RADIOACTIVE SEED AND SENTINEL LYMPH NODE BIOPSY Left 05/07/2018   Procedure: BREAST LUMPECTOMY WITH RADIOACTIVE SEED AND SENTINEL LYMPH NODE BIOPSY;  Surgeon: Rolm Bookbinder, MD;  Location: [place];  Service: General;  Laterality: Left;  . CESAREAN SECTION    . CHOLECYSTECTOMY N/A 01/04/2015   Procedure: LAPAROSCOPIC CHOLECYSTECTOMY WITH INTRAOPERATIVE CHOLANGIOGRAM;  Surgeon: Erroll Luna, MD;  Location: [place];  Service: General;  Laterality: N/A;  . COLONOSCOPY    . ESOPHAGOGASTRODUODENOSCOPY    . WISDOM TOOTH EXTRACTION      There were no vitals filed for this visit.  Subjective Assessment - 12/02/18 1522    Subjective  Felt good after yesterdays session. My husband did  well learning.     Pertinent History  05/07/18 Left lumpectomy: IDC grade 1, 1.2 cm, intermediate grade DCIS, margins negative,PASH, 0/7 sentinel lymph nodes negative, ER 95%, PR 95%, Ki-67 1%, HER-2 negative ratio 1.47, T1CN0 stage Ia, no chemo needed, radiation completed 08/10/18 with boost added, firomyalgia, depression    Patient Stated Goals  get rid of swelling    Currently in Pain?  No/denies                       [facility] Adult PT Treatment/Exercise - 12/02/18 0001      Manual Therapy   Manual Lymphatic Drainage (MLD)  in supine,short neck, superficial and deep abdominal, right axillary nodes, anterior interaxillary anastamosis, left inguinal nodes and left axillo-inguinal anastamosis, left breast, then to right sidelying for more work on left breast and posterior interaxillary anastamosis                     PT Long Term Goals - 12/02/18 1523      PT LONG TERM GOAL #1   Title  Pt will decrease breast size by at least 50% subjectively and per picture    Baseline  Pt reports 25% as of now-12/02/18    Status  Partially Met  PT LONG TERM GOAL #2   Title  Pt will report no breast pain at the end of the day    Baseline  Lt breast is much improved by end of day being only mildly painful-12/02/18    Status  Partially Met      PT LONG TERM GOAL #3   Title  pt will ind with self breast MLD    Baseline  Pt and husband are independent with this now-12/02/18    Status  Achieved            Plan - 12/02/18 1623    Clinical Impression Statement  Continued with manual lymph drainage today as has been i nstructed. Tape still on from yesterdays visit so just worked over this. Pt is making great gains towards goals at this time. Clinic will be closed over next 2 weeks due to [place] so reviewed all with pt before she left today. She verbalizes understanding of all.     Rehab Potential  Excellent    PT Frequency  2x / week    PT Duration  6 weeks    PT  Treatment/Interventions  Manual lymph drainage;Manual techniques;Taping;Patient/family education;Therapeutic exercise;ADLs/Self Care Home Management    PT Next Visit Plan  Review Lt breast MLD assessing pts technique and see if ready for D/C at next session; prior to discharge arrange for pt to get personal training at [facility] fitness centers     Consulted and Agree with Plan of Care  Patient       Patient will benefit from skilled therapeutic intervention in order to improve the following deficits and impairments:  Pain, Increased edema  Visit Diagnosis: Lymphedema, not elsewhere classified  Disorder of the skin and subcutaneous tissue related to radiation, unspecified     Problem List Patient Active Problem List   Diagnosis Date Noted  . Genetic testing 04/16/2018  . Malignant neoplasm of upper-outer quadrant of left breast in female, estrogen receptor positive ([place]) 03/25/2018  . Family history of breast cancer 03/24/2018  . Anemia, iron deficiency 10/20/2014  . Chest pain   . Palpitations   . Neurofibromatosis ([place])   . Tachycardia   . Hx of migraines   . H/O: depression   . Mitral regurgitation   . BLOOD IN STOOL 01/12/2009  . BLOOD IN [place], OCCULT 01/12/2009    Otelia Limes, PTA 12/02/2018, 4:27 PM  [place], [place], [address] Phone: [phone]   Fax:  [phone]  Name: JANNELLY BERGREN MRN: [id number] Date of Birth: 01/27/1976

## 2018-12-03 ENCOUNTER — Ambulatory Visit: Payer: Self-pay | Admitting: Radiation Oncology

## 2018-12-08 MED FILL — VENLAFAXINE HCL ER 75 MG CA: 75 | 45 days supply | Qty: 90 | Fill #1

## 2018-12-08 MED FILL — TAMOXIFEN CITRATE 20 MG TAB: 20 | 90 days supply | Qty: 90 | Fill #1

## 2018-12-11 ENCOUNTER — Encounter: Payer: 59 | Admitting: Rehabilitation

## 2018-12-16 ENCOUNTER — Encounter: Payer: 59 | Admitting: Rehabilitation

## 2018-12-18 ENCOUNTER — Encounter: Payer: 59 | Admitting: Rehabilitation

## 2018-12-21 ENCOUNTER — Encounter: Payer: Self-pay | Admitting: Rehabilitation

## 2018-12-21 ENCOUNTER — Encounter: Payer: 59 | Admitting: Adult Health

## 2018-12-21 NOTE — Progress Notes (Deleted)
CLINIC:  Survivorship   REASON FOR VISIT:  Routine follow-up post-treatment for a recent history of breast cancer.  BRIEF ONCOLOGIC HISTORY:    Malignant neoplasm of upper-outer quadrant of left breast in female, estrogen receptor positive ([place])   03/20/2018 Initial Diagnosis    Screening detected left breast spiculated mass by ultrasound measured 1.2 cm.  Biopsy revealed invasive ductal carcinoma grade 1-2 with high-grade DCIS, ER 95%, PR 95%, Ki-67 1%, HER-2 negative ratio 1.47, T1CN0 stage I a clinical stage    03/20/2018 Cancer Staging    Staging form: Breast, AJCC 8th Edition - Clinical stage from 03/20/2018: Stage IA (cT1c, cN0, cM0, G2, ER+, PR+, HER2-) - Signed by Gardenia Phlegm, NP on 05/27/2018    05/07/2018 Surgery    Left lumpectomy: IDC grade 1, 1.2 cm, intermediate grade DCIS, margins negative,PASH, 0/7 sentinel lymph nodes negative, ER 95%, PR 95%, Ki-67 1%, HER-2 negative ratio 1.47, T1CN0 stage Ia    05/07/2018 Cancer Staging    Staging form: Breast, AJCC 8th Edition - Pathologic stage from 05/07/2018: Stage IA (pT1c, pN0, cM0, G1, ER+, PR+, HER2-, Oncotype DX score: 11) - Signed by Gardenia Phlegm, NP on 05/27/2018    05/25/2018 Oncotype testing    Oncotype DX score 11: 3% risk of distant recurrence of 9 years, low risk    06/24/2018 - 08/03/2018 Radiation Therapy    Adjuvant radiation therapy    09/2018 -  Anti-estrogen oral therapy    Tamoxifen daily     INTERVAL HISTORY:  Ms. Azizi presents to the [facility] today for our initial meeting to review her survivorship care plan detailing her treatment course for breast cancer, as well as monitoring long-term side effects of that treatment, education regarding health maintenance, screening, and overall wellness and health promotion.     Overall, Ms. Golz reports feeling quite well since completing her radiation therapy approximately 3 months ago.  She ***    REVIEW OF SYSTEMS:  Review of  Systems - Oncology Breast: Denies any new nodularity, masses, tenderness, nipple changes, or nipple discharge.      ONCOLOGY TREATMENT TEAM:  1. Surgeon:  Dr. Marland Kitchen at [facility] 2. Medical Oncologist: Dr. Marland Kitchen  3. Radiation Oncologist: Dr. Marland Kitchen    PAST MEDICAL/SURGICAL HISTORY:  Past Medical History:  Diagnosis Date  . Allergy   . Anemia   . Anxiety   . Cancer ([place])   . Chest pain   . Depression   . Family history of breast cancer   . Fibromyalgia    neurofibromatosis  . H/O: depression   . Hx of migraines   . Mitral regurgitation    mild per echo EF 55-60%  . Neurofibromatosis ([place])   . Palpitations   . Tachycardia    Hx. of  . Wears contact lenses    Past Surgical History:  Procedure Laterality Date  . BREAST LUMPECTOMY WITH RADIOACTIVE SEED AND SENTINEL LYMPH NODE BIOPSY Left 05/07/2018   Procedure: BREAST LUMPECTOMY WITH RADIOACTIVE SEED AND SENTINEL LYMPH NODE BIOPSY;  Surgeon: Rolm Bookbinder, MD;  Location: [employer];  Service: General;  Laterality: Left;  . CESAREAN SECTION    . CHOLECYSTECTOMY N/A 01/04/2015   Procedure: LAPAROSCOPIC CHOLECYSTECTOMY WITH INTRAOPERATIVE CHOLANGIOGRAM;  Surgeon: Erroll Luna, MD;  Location: [place];  Service: General;  Laterality: N/A;  . COLONOSCOPY    . ESOPHAGOGASTRODUODENOSCOPY    . WISDOM TOOTH EXTRACTION       ALLERGIES:  Allergies  Allergen Reactions  . Latex Rash     CURRENT MEDICATIONS:  Outpatient Encounter Medications as of 12/21/2018  Medication Sig Note  . acetaminophen (TYLENOL) 500 MG tablet Take 500 mg by mouth every 6 (six) hours as needed.   . ALPRAZolam (XANAX) 0.5 MG tablet Take 0.5 mg by mouth at bedtime as needed (FOR ANXIETY). :take 1/2 to 1 twice a day as needed 11/03/2014: Received from: [facility]  . cetirizine (ZYRTEC) 10 MG tablet Take 10 mg by mouth as needed for allergies.    Marland Kitchen ibuprofen (ADVIL,MOTRIN) 200 MG tablet Take 800 mg by mouth  daily as needed for headache.   . metoprolol succinate (TOPROL-XL) 50 MG 24 hr tablet TAKE 1 TABLET BY MOUTH DAILY. TAKE WITH OR IMMEDIATELY FOLLOWING A MEAL.   Marland Kitchen oxyCODONE (OXY IR/ROXICODONE) 5 MG immediate release tablet Take 1 tablet (5 mg total) by mouth every 6 (six) hours as needed for moderate pain, severe pain or breakthrough pain.   Marland Kitchen prochlorperazine (COMPAZINE) 10 MG tablet Take 1 tablet (10 mg total) by mouth every 6 (six) hours as needed for nausea or vomiting.   . propranolol (INDERAL) 10 MG tablet TAKE 1 TABLET BY MOUTH FOUR TIMES DAILY AS NEEDED FOR PALPITATIONS   . tamoxifen (NOLVADEX) 20 MG tablet Take 1 tablet (20 mg total) by mouth daily.   Marland Kitchen venlafaxine XR (EFFEXOR-XR) 150 MG 24 hr capsule Take 150 mg by mouth daily with breakfast.    No facility-administered encounter medications on file as of 12/21/2018.      ONCOLOGIC FAMILY HISTORY:  Family History  Problem Relation Age of Onset  . Breast cancer Mother 86       estrogen negative, recurred in 2012  . Diabetes Mother   . Neurofibromatosis Father   . Atrial fibrillation Maternal Grandfather   . Cancer Paternal Grandmother        type unk, died at 45  . Neurofibromatosis Paternal Grandmother   . Stroke Paternal Grandfather   . Heart disease Paternal Uncle   . Diabetes Paternal Uncle   . Heart attack Neg Hx   . Hypertension Neg Hx      GENETIC COUNSELING/TESTING: ***  SOCIAL HISTORY:  AGNES BRIGHTBILL is /single/married/divorced/widowed/separated and lives alone/with her spouse/family/friend in (city), [place].  She has (#) children and they live in (city).  Ms. Rowlands is currently retired/disabled/working part-time/full-time as ***.  She denies any current or history of tobacco, alcohol, or illicit drug use.     PHYSICAL EXAMINATION:  Vital Signs:  There were no vitals filed for this visit. There were no vitals filed for this visit. General: Well-nourished, well-appearing female in no acute  distress.  She is unaccompanied/accompanied in clinic by her ***** today.   HEENT: Head is normocephalic.  Pupils equal and reactive to light. Conjunctivae clear without exudate.  Sclerae anicteric. Oral mucosa is pink, moist.  Oropharynx is pink without lesions or erythema.  Lymph: No cervical, supraclavicular, or infraclavicular lymphadenopathy noted on palpation.  Cardiovascular: Regular rate and rhythm.Marland Kitchen Respiratory: Clear to auscultation bilaterally. Chest expansion symmetric; breathing non-labored.  GI: Abdomen soft and round; non-tender, non-distended. Bowel sounds normoactive.  GU: Deferred.  Neuro: No focal deficits. Steady gait.  Psych: Mood and affect normal and appropriate for situation.  Extremities: No edema. MSK: No focal spinal tenderness to palpation.  Full range of motion in bilateral upper extremities Skin: Warm and dry.  LABORATORY DATA:  None for this visit.  DIAGNOSTIC IMAGING:  None for  this visit.      ASSESSMENT AND PLAN:  Ms.. Ulloa is a pleasant 43 y.o. female with Stage IA left breast invasive ductal carcinoma, ER+/PR+/HER2-, diagnosed in 03/2018, treated with lumpectomy, adjuvant radiation therapy, and anti-estrogen therapy with Tamoxifen beginning in 09/2018.  She presents to the Survivorship Clinic for our initial meeting and routine follow-up post-completion of treatment for breast cancer.    1. Stage IA right breast cancer:  Ms. Curd is continuing to recover from definitive treatment for breast cancer. She will follow-up with her medical oncologist, Dr. Lindi Adie in 6 months with history and physical exam per surveillance protocol.  She will continue her anti-estrogen therapy with Tamoxifen. Thus far, she is tolerating the Tamoxifen well, with minimal side effects. Today, a comprehensive survivorship care plan and treatment summary was reviewed with the patient today detailing her breast cancer diagnosis, treatment course, potential late/long-term effects of  treatment, appropriate follow-up care with recommendations for the future, and patient education resources.  A copy of this summary, along with a letter will be sent to the patient's primary care provider via mail/fax/In Basket message after today's visit.    2. Bone health:  Given Ms. Konopka's history of breast cancer, she is at slight risk for bone demineralization.    I counseled her that Tamoxifen has a protective effect on her bones.  She was given education on specific activities to promote bone health.  3. Cancer screening:  Due to Ms. Lema's history and her age, she should receive screening for skin cancers, colon cancer, and gynecologic cancers.  The information and recommendations are listed on the patient's comprehensive care plan/treatment summary and were reviewed in detail with the patient.    4. Health maintenance and wellness promotion: Ms. Groome was encouraged to consume 5-7 servings of fruits and vegetables per day. We reviewed the "Nutrition Rainbow" handout.  She was also encouraged to engage in moderate to vigorous exercise for 30 minutes per day most days of the week. We discussed the [employer] fitness program, which is designed for cancer survivors to help them become more physically fit after cancer treatments.  She was instructed to limit her alcohol consumption and continue to abstain from tobacco use.     #. Support services/counseling: It is not uncommon for this period of the patient's cancer care trajectory to be one of many emotions and stressors.  I gave her a handout detailing all of the resources available for cancer patients during the COVID-19 pandemic.  She was given information regarding our available services and encouraged to contact me with any questions or for help enrolling in any of our support group/programs.    Dispo:   -Return to cancer center in 6 months for f/u with Dr. Lindi Adie  -Mammogram due in *** -Follow up with surgery *** -She is welcome to  return back to the Survivorship Clinic at any time; no additional follow-up needed at this time.  -Consider referral back to survivorship as a long-term survivor for continued surveillance  A total of (30) minutes of face-to-face time was spent with this patient with greater than 50% of that time in counseling and care-coordination.   Gardenia Phlegm, [place] [phone]   Note: PRIMARY CARE PROVIDER Dorothy Puffer [place], [place] [phone]

## 2019-01-11 MED FILL — ALPRAZolam 0.5 MG TABS: 0.5 | 30 days supply | Qty: 60 | Fill #1

## 2019-01-11 MED FILL — VENLAFAXINE HCL ER 75 MG CA: 75 | 45 days supply | Qty: 90 | Fill #2

## 2019-01-11 MED FILL — METOPROLOL SUCCINATE ER 50: 50 | 90 days supply | Qty: 90 | Fill #3

## 2019-01-14 ENCOUNTER — Ambulatory Visit: Payer: 59 | Admitting: Radiation Oncology

## 2019-01-18 ENCOUNTER — Other Ambulatory Visit: Payer: Self-pay

## 2019-01-18 ENCOUNTER — Ambulatory Visit: Payer: 59 | Attending: Radiation Oncology

## 2019-01-18 DIAGNOSIS — I890 Lymphedema, not elsewhere classified: Secondary | ICD-10-CM | POA: Diagnosis not present

## 2019-01-18 DIAGNOSIS — L599 Disorder of the skin and subcutaneous tissue related to radiation, unspecified: Secondary | ICD-10-CM | POA: Diagnosis not present

## 2019-01-18 NOTE — Therapy (Signed)
[place], [place], [address] Phone: [phone]   Fax:  [phone]  Physical Therapy Treatment  Patient Details  Name: Anne Horton MRN: [id number] Date of Birth: 07/14/1976 Referring Provider (PT): Dr. Sondra Come   Encounter Date: 01/18/2019  PT End of Session - 01/18/19 1159    Visit Number  6    Number of Visits  13    Date for PT Re-Evaluation  02/15/19    Authorization Type  [employer] insurance ; medical necessity    PT Start Time  1105    PT Stop Time  1155    PT Time Calculation (min)  50 min    Activity Tolerance  Patient tolerated treatment well    Behavior During Therapy  [facility] for tasks assessed/performed       Past Medical History:  Diagnosis Date  . Allergy   . Anemia   . Anxiety   . Cancer ([place])   . Chest pain   . Depression   . Family history of breast cancer   . Fibromyalgia    neurofibromatosis  . H/O: depression   . Hx of migraines   . Mitral regurgitation    mild per echo EF 55-60%  . Neurofibromatosis ([place])   . Palpitations   . Tachycardia    Hx. of  . Wears contact lenses     Past Surgical History:  Procedure Laterality Date  . BREAST LUMPECTOMY WITH RADIOACTIVE SEED AND SENTINEL LYMPH NODE BIOPSY Left 05/07/2018   Procedure: BREAST LUMPECTOMY WITH RADIOACTIVE SEED AND SENTINEL LYMPH NODE BIOPSY;  Surgeon: Rolm Bookbinder, MD;  Location: [place];  Service: General;  Laterality: Left;  . CESAREAN SECTION    . CHOLECYSTECTOMY N/A 01/04/2015   Procedure: LAPAROSCOPIC CHOLECYSTECTOMY WITH INTRAOPERATIVE CHOLANGIOGRAM;  Surgeon: Erroll Luna, MD;  Location: [place];  Service: General;  Laterality: N/A;  . COLONOSCOPY    . ESOPHAGOGASTRODUODENOSCOPY    . WISDOM TOOTH EXTRACTION      There were no vitals filed for this visit.  Subjective Assessment - 01/18/19 1106    Subjective  I slacked off doing the manual lymph drainage and  it's flared back up. My husband and I were doing it alot at the start of all this (COVID pandemic) but we got busy around the house.     Pertinent History  05/07/18 Left lumpectomy: IDC grade 1, 1.2 cm, intermediate grade DCIS, margins negative,PASH, 0/7 sentinel lymph nodes negative, ER 95%, PR 95%, Ki-67 1%, HER-2 negative ratio 1.47, T1CN0 stage Ia, no chemo needed, radiation completed 08/10/18 with boost added, firomyalgia, depression    Patient Stated Goals  get rid of swelling    Currently in Pain?  Yes    Pain Score  3     Pain Location  Breast    Pain Orientation  Left    Pain Descriptors / Indicators  Aching;Dull    Pain Type  Acute pain    Pain Onset  1 to 4 weeks ago    Aggravating Factors   just stopped doing the self MLD    Pain Relieving Factors  wearing my compression bras always                       [facility] Adult PT Treatment/Exercise - 01/18/19 0001      Manual Therapy   Manual Lymphatic Drainage (MLD)  In supine: short neck, superficial and deep abdominal, right axillary nodes,  anterior interaxillary anastamosis, left inguinal nodes and left axillo-inguinal anastamosis, left breast, then to right sidelying for more work on left breast and posterior interaxillary anastamosis (tape placed in this position as well) then finished retracing anastomosis in supine. Pt did have noticeable increased swelling today from when this therapist last saw her in March.      Kinesiotix   Edema  4 fan shaped kinesiotape with 2 prongs above and 2 prongs below breast                   PT Long Term Goals - 01/18/19 1109      PT LONG TERM GOAL #1   Title  Pt will decrease breast size by at least 50% subjectively and per picture    Baseline  Pt reports 25% as of now-12/02/18, only about 15% now since flare up-01/18/19    Status  On-going      PT LONG TERM GOAL #2   Title  Pt will report no breast pain at the end of the day    Baseline  Lt breast is much improved by  end of day being only mildly painful-12/02/18; 2-3/10 pain now with increased fullness-01/18/19    Status  On-going      PT LONG TERM GOAL #3   Title  pt will ind with self breast MLD    Baseline  Pt and husband are independent with this now-12/02/18; pt needs review now as it's been almost 3 months since being here and doing intermittently at home-01/18/19    Status  Partially Met      PT LONG TERM GOAL #4   Title  Pt will be aware of lymphedema signs and symptoms as well as precautions     Status  Achieved            Plan - 01/18/19 1200    Clinical Impression Statement  First visit back since 12/02/18 due to [place] pandemic. Reassessed pts goals and she has had minor set back due to performing self manual lymph drainage less frequently. She does report still wearing compression bra daily but not the foam. She was encouraged today to resume wearing compression foam in bra at night (pt doesn't like wearing this during day), and to also resume self MLD daily. Pt was very agreeable to this and would like to continue PT 1x/wk to assess progress with reduction of breast lymphedema and to reassess technique of self MLD.     Personal Factors and Comorbidities  Comorbidity 1    Comorbidities  radiation history    Stability/Clinical Decision Making  Evolving/Moderate complexity   new swelling   Rehab Potential  Excellent    PT Frequency  1x / week    PT Duration  4 weeks    PT Treatment/Interventions  Manual lymph drainage;Manual techniques;Taping;Patient/family education;Therapeutic exercise;ADLs/Self Care Home Management    PT Next Visit Plan  Assess if placement of kinesiotape was beneficial. Review and continue manual lymph drainage. Renewal done for 1x/wk for up to 4 weeks.     Consulted and Agree with Plan of Care  Patient       Patient will benefit from skilled therapeutic intervention in order to improve the following deficits and impairments:  Pain, Increased edema  Visit  Diagnosis: Lymphedema, not elsewhere classified  Disorder of the skin and subcutaneous tissue related to radiation, unspecified     Problem List Patient Active Problem List   Diagnosis Date Noted  . Genetic testing 04/16/2018  .  Malignant neoplasm of upper-outer quadrant of left breast in female, estrogen receptor positive ([place]) 03/25/2018  . Family history of breast cancer 03/24/2018  . Anemia, iron deficiency 10/20/2014  . Chest pain   . Palpitations   . Neurofibromatosis ([place])   . Tachycardia   . Hx of migraines   . H/O: depression   . Mitral regurgitation   . BLOOD IN STOOL 01/12/2009  . BLOOD IN STOOL, OCCULT 01/12/2009    Otelia Limes, PTA 01/18/2019, 12:08 PM  [place], [place], [address] Phone: [phone]   Fax:  [phone]  Name: Anne Horton MRN: [id number] Date of Birth: 04-23-1976

## 2019-01-29 ENCOUNTER — Ambulatory Visit: Payer: 59

## 2019-01-29 ENCOUNTER — Other Ambulatory Visit: Payer: Self-pay

## 2019-01-29 DIAGNOSIS — I890 Lymphedema, not elsewhere classified: Secondary | ICD-10-CM

## 2019-01-29 DIAGNOSIS — L599 Disorder of the skin and subcutaneous tissue related to radiation, unspecified: Secondary | ICD-10-CM | POA: Diagnosis not present

## 2019-01-29 NOTE — Therapy (Signed)
[place], [place], [address] Phone: [phone]   Fax:  [phone]  Physical Therapy Treatment  Patient Details  Name: Anne Horton MRN: [id number] Date of Birth: [birth year] Referring Provider (PT): Dr. Sondra Come   Encounter Date: 01/29/2019  PT End of Session - 01/29/19 1057    Visit Number  7    Number of Visits  13    Date for PT Re-Evaluation  02/15/19    PT Start Time  5427    PT Stop Time  1051    PT Time Calculation (min)  49 min    Activity Tolerance  Patient tolerated treatment well    Behavior During Therapy  [facility] for tasks assessed/performed       Past Medical History:  Diagnosis Date  . Allergy   . Anemia   . Anxiety   . Cancer ([place])   . Chest pain   . Depression   . Family history of breast cancer   . Fibromyalgia    neurofibromatosis  . H/O: depression   . Hx of migraines   . Mitral regurgitation    mild per echo EF 55-60%  . Neurofibromatosis ([place])   . Palpitations   . Tachycardia    Hx. of  . Wears contact lenses     Past Surgical History:  Procedure Laterality Date  . BREAST LUMPECTOMY WITH RADIOACTIVE SEED AND SENTINEL LYMPH NODE BIOPSY Left 05/07/2018   Procedure: BREAST LUMPECTOMY WITH RADIOACTIVE SEED AND SENTINEL LYMPH NODE BIOPSY;  Surgeon: Rolm Bookbinder, MD;  Location: [place];  Service: General;  Laterality: Left;  . CESAREAN SECTION    . CHOLECYSTECTOMY N/A 01/04/2015   Procedure: LAPAROSCOPIC CHOLECYSTECTOMY WITH INTRAOPERATIVE CHOLANGIOGRAM;  Surgeon: Erroll Luna, MD;  Location: [place];  Service: General;  Laterality: N/A;  . COLONOSCOPY    . ESOPHAGOGASTRODUODENOSCOPY    . WISDOM TOOTH EXTRACTION      There were no vitals filed for this visit.  Subjective Assessment - 01/29/19 1004    Subjective  I've gotten back into doing the self manual lymph drainage daily and it's been helping the fluid decrease and  less heaviness in the breast.     Pertinent History  05/07/18 Left lumpectomy: IDC grade 1, 1.2 cm, intermediate grade DCIS, margins negative,PASH, 0/7 sentinel lymph nodes negative, ER 95%, PR 95%, Ki-67 1%, HER-2 negative ratio 1.47, T1CN0 stage Ia, no chemo needed, radiation completed 08/10/18 with boost added, firomyalgia, depression    Patient Stated Goals  get rid of swelling    Currently in Pain?  Yes    Pain Score  4     Pain Location  Breast    Pain Orientation  Left    Pain Descriptors / Indicators  Aching;Dull    Pain Type  Acute pain    Pain Onset  1 to 4 weeks ago    Pain Frequency  Intermittent    Aggravating Factors   nothing making it worse for now     Pain Relieving Factors  doing self MLD                       [facility] Adult PT Treatment/Exercise - 01/29/19 0001      Manual Therapy   Manual Lymphatic Drainage (MLD)  In supine: short neck, superficial and deep abdominal, right axillary nodes, anterior interaxillary anastamosis, left inguinal nodes and left axillo-inguinal anastamosis, left breast, then to right sidelying for  more work on left breast and posterior interaxillary anastamosis then finished retracing anastomosis in supine. Noticeable softening of Lt lateral breast by end of session.       Kinesiotix   Edema  4 fan shaped kinesiotape towards lateral breast along Lt axillo-inguinal anastomosis                  PT Long Term Goals - 01/18/19 1109      PT LONG TERM GOAL #1   Title  Pt will decrease breast size by at least 50% subjectively and per picture    Baseline  Pt reports 25% as of now-12/02/18, only about 15% now since flare up-01/18/19    Status  On-going      PT LONG TERM GOAL #2   Title  Pt will report no breast pain at the end of the day    Baseline  Lt breast is much improved by end of day being only mildly painful-12/02/18; 2-3/10 pain now with increased fullness-01/18/19    Status  On-going      PT LONG TERM GOAL #3   Title   pt will ind with self breast MLD    Baseline  Pt and husband are independent with this now-12/02/18; pt needs review now as it's been almost 3 months since being here and doing intermittently at home-01/18/19    Status  Partially Met      PT LONG TERM GOAL #4   Title  Pt will be aware of lymphedema signs and symptoms as well as precautions     Status  Achieved            Plan - 01/29/19 1057    Clinical Impression Statement  Pt has done well since last week. She has resumed daily self manual lymph drainage and reports softening of skin on breast since then. Continued with MLD to Lt breast in supine and Rt S/L and applied kinesiotape as done at last session. Good potential for D/C in next 2 sessions.     Personal Factors and Comorbidities  Comorbidity 1    Comorbidities  radiation history    Stability/Clinical Decision Making  Evolving/Moderate complexity   new swelling   Rehab Potential  Excellent    PT Frequency  1x / week    PT Duration  4 weeks    PT Treatment/Interventions  Manual lymph drainage;Manual techniques;Taping;Patient/family education;Therapeutic exercise;ADLs/Self Care Home Management    PT Next Visit Plan  Review and continue manual lymph drainage of Lt breast and kinesiotape.     Consulted and Agree with Plan of Care  Patient       Patient will benefit from skilled therapeutic intervention in order to improve the following deficits and impairments:  Pain, Increased edema  Visit Diagnosis: Lymphedema, not elsewhere classified  Disorder of the skin and subcutaneous tissue related to radiation, unspecified     Problem List Patient Active Problem List   Diagnosis Date Noted  . Genetic testing 04/16/2018  . Malignant neoplasm of upper-outer quadrant of left breast in female, estrogen receptor positive ([place]) 03/25/2018  . Family history of breast cancer 03/24/2018  . Anemia, iron deficiency 10/20/2014  . Chest pain   . Palpitations   . Neurofibromatosis ([place])    . Tachycardia   . Hx of migraines   . H/O: depression   . Mitral regurgitation   . BLOOD IN STOOL 01/12/2009  . BLOOD IN STOOL, OCCULT 01/12/2009    Otelia Limes, PTA 01/29/2019, 11:00 AM  [place], [place], [address] Phone: [phone]   Fax:  [phone]  Name: Anne Horton MRN: [id number] Date of Birth: July 31, 1976

## 2019-02-01 ENCOUNTER — Ambulatory Visit: Payer: 59

## 2019-02-01 ENCOUNTER — Other Ambulatory Visit: Payer: Self-pay

## 2019-02-01 DIAGNOSIS — I890 Lymphedema, not elsewhere classified: Secondary | ICD-10-CM | POA: Diagnosis not present

## 2019-02-01 DIAGNOSIS — L599 Disorder of the skin and subcutaneous tissue related to radiation, unspecified: Secondary | ICD-10-CM | POA: Diagnosis not present

## 2019-02-01 NOTE — Therapy (Signed)
[place], [place], [address] Phone: [phone]   Fax:  [phone]  Physical Therapy Treatment  Patient Details  Name: Anne Horton MRN: [id number] Date of Birth: 1975/12/26 Referring Provider (PT): Dr. Sondra Come   Encounter Date: 02/01/2019  PT End of Session - 02/01/19 1153    Visit Number  8    Number of Visits  13    Date for PT Re-Evaluation  02/15/19    PT Start Time  1104    PT Stop Time  1150    PT Time Calculation (min)  46 min    Activity Tolerance  Patient tolerated treatment well    Behavior During Therapy  [facility] for tasks assessed/performed       Past Medical History:  Diagnosis Date  . Allergy   . Anemia   . Anxiety   . Cancer ([place])   . Chest pain   . Depression   . Family history of breast cancer   . Fibromyalgia    neurofibromatosis  . H/O: depression   . Hx of migraines   . Mitral regurgitation    mild per echo EF 55-60%  . Neurofibromatosis ([place])   . Palpitations   . Tachycardia    Hx. of  . Wears contact lenses     Past Surgical History:  Procedure Laterality Date  . BREAST LUMPECTOMY WITH RADIOACTIVE SEED AND SENTINEL LYMPH NODE BIOPSY Left 05/07/2018   Procedure: BREAST LUMPECTOMY WITH RADIOACTIVE SEED AND SENTINEL LYMPH NODE BIOPSY;  Surgeon: Rolm Bookbinder, MD;  Location: [place];  Service: General;  Laterality: Left;  . CESAREAN SECTION    . CHOLECYSTECTOMY N/A 01/04/2015   Procedure: LAPAROSCOPIC CHOLECYSTECTOMY WITH INTRAOPERATIVE CHOLANGIOGRAM;  Surgeon: Erroll Luna, MD;  Location: [place];  Service: General;  Laterality: N/A;  . COLONOSCOPY    . ESOPHAGOGASTRODUODENOSCOPY    . WISDOM TOOTH EXTRACTION      There were no vitals filed for this visit.  Subjective Assessment - 02/01/19 1104    Subjective  I went to the beach on Saturday and got sunburned on my back but my breast is actually okay.     Pertinent  History  05/07/18 Left lumpectomy: IDC grade 1, 1.2 cm, intermediate grade DCIS, margins negative,PASH, 0/7 sentinel lymph nodes negative, ER 95%, PR 95%, Ki-67 1%, HER-2 negative ratio 1.47, T1CN0 stage Ia, no chemo needed, radiation completed 08/10/18 with boost added, firomyalgia, depression    Patient Stated Goals  get rid of swelling    Currently in Pain?  No/denies                       [facility] Adult PT Treatment/Exercise - 02/01/19 0001      Manual Therapy   Manual Lymphatic Drainage (MLD)  In supine: short neck, superficial and deep abdominal, right axillary nodes, anterior interaxillary anastamosis, left inguinal nodes and left axillo-inguinal anastamosis, left breast, then to right sidelying for more work on left breast and posterior interaxillary anastamosis then finished retracing anastomosis in supine. Noticeable softening of Lt lateral breast by end of session.       Kinesiotix   Edema  4 fan shaped kinesiotape towards lateral breast along Lt axillo-inguinal anastomosis with pt in Rt S/L and Lt UE OH                  PT Long Term Goals - 01/18/19 1109      PT  LONG TERM GOAL #1   Title  Pt will decrease breast size by at least 50% subjectively and per picture    Baseline  Pt reports 25% as of now-12/02/18, only about 15% now since flare up-01/18/19    Status  On-going      PT LONG TERM GOAL #2   Title  Pt will report no breast pain at the end of the day    Baseline  Lt breast is much improved by end of day being only mildly painful-12/02/18; 2-3/10 pain now with increased fullness-01/18/19    Status  On-going      PT LONG TERM GOAL #3   Title  pt will ind with self breast MLD    Baseline  Pt and husband are independent with this now-12/02/18; pt needs review now as it's been almost 3 months since being here and doing intermittently at home-01/18/19    Status  Partially Met      PT LONG TERM GOAL #4   Title  Pt will be aware of lymphedema signs and symptoms  as well as precautions     Status  Achieved            Plan - 02/01/19 1154    Clinical Impression Statement  Pts Lt breast continues to show improvement by softening noted of entire breast. Pt also reports continuing to note improvement when she is consistent with her self MLD. She will be ready for D/C after next session.     Personal Factors and Comorbidities  Comorbidity 1    Comorbidities  radiation history    Stability/Clinical Decision Making  Evolving/Moderate complexity   new swelling   Rehab Potential  Excellent    PT Frequency  1x / week    PT Duration  4 weeks    PT Treatment/Interventions  Manual lymph drainage;Manual techniques;Taping;Patient/family education;Therapeutic exercise;ADLs/Self Care Home Management    PT Next Visit Plan  D/C next visit and assess goals; continue manual lymph drainage of Lt breast and kinesiotape.     Consulted and Agree with Plan of Care  Patient       Patient will benefit from skilled therapeutic intervention in order to improve the following deficits and impairments:  Pain, Increased edema  Visit Diagnosis: Lymphedema, not elsewhere classified  Disorder of the skin and subcutaneous tissue related to radiation, unspecified     Problem List Patient Active Problem List   Diagnosis Date Noted  . Genetic testing 04/16/2018  . Malignant neoplasm of upper-outer quadrant of left breast in female, estrogen receptor positive ([place]) 03/25/2018  . Family history of breast cancer 03/24/2018  . Anemia, iron deficiency 10/20/2014  . Chest pain   . Palpitations   . Neurofibromatosis ([place])   . Tachycardia   . Hx of migraines   . H/O: depression   . Mitral regurgitation   . BLOOD IN STOOL 01/12/2009  . BLOOD IN STOOL, OCCULT 01/12/2009    Otelia Limes, PTA 02/01/2019, 11:56 AM  [place], [place], [address] Phone: [phone]   Fax:   [phone]  Name: Anne Horton MRN: [id number] Date of Birth: 03/17/1976

## 2019-02-04 ENCOUNTER — Other Ambulatory Visit: Payer: Self-pay | Admitting: *Deleted

## 2019-02-04 DIAGNOSIS — C50412 Malignant neoplasm of upper-outer quadrant of left female breast: Secondary | ICD-10-CM

## 2019-02-04 DIAGNOSIS — Z170 Estrogen receptor positive status [ER+]: Secondary | ICD-10-CM

## 2019-02-10 ENCOUNTER — Ambulatory Visit: Payer: 59

## 2019-02-10 ENCOUNTER — Other Ambulatory Visit: Payer: Self-pay

## 2019-02-10 ENCOUNTER — Telehealth: Payer: Self-pay | Admitting: Adult Health

## 2019-02-10 DIAGNOSIS — L599 Disorder of the skin and subcutaneous tissue related to radiation, unspecified: Secondary | ICD-10-CM

## 2019-02-10 DIAGNOSIS — I890 Lymphedema, not elsewhere classified: Secondary | ICD-10-CM

## 2019-02-10 NOTE — Telephone Encounter (Signed)
Scheduled appt per sch msg. Called and left msg for patient.  °

## 2019-02-10 NOTE — Therapy (Signed)
[place], [place], [address] Phone: [phone]   Fax:  [phone]  Physical Therapy Treatment  Patient Details  Name: Anne Horton MRN: [id number] Date of Birth: 1976/09/13 Referring Provider (PT): Dr. Sondra Come   Encounter Date: 02/10/2019  PT End of Session - 02/10/19 1157    Visit Number  9    Number of Visits  13    Date for PT Re-Evaluation  02/15/19    Authorization Type  cone insurance ; medical necessity    PT Start Time  1103    PT Stop Time  1149    PT Time Calculation (min)  46 min    Activity Tolerance  Patient tolerated treatment well    Behavior During Therapy  [facility] for tasks assessed/performed       Past Medical History:  Diagnosis Date  . Allergy   . Anemia   . Anxiety   . Cancer ([place])   . Chest pain   . Depression   . Family history of breast cancer   . Fibromyalgia    neurofibromatosis  . H/O: depression   . Hx of migraines   . Mitral regurgitation    mild per echo EF 55-60%  . Neurofibromatosis ([place])   . Palpitations   . Tachycardia    Hx. of  . Wears contact lenses     Past Surgical History:  Procedure Laterality Date  . BREAST LUMPECTOMY WITH RADIOACTIVE SEED AND SENTINEL LYMPH NODE BIOPSY Left 05/07/2018   Procedure: BREAST LUMPECTOMY WITH RADIOACTIVE SEED AND SENTINEL LYMPH NODE BIOPSY;  Surgeon: Rolm Bookbinder, MD;  Location: [place];  Service: General;  Laterality: Left;  . CESAREAN SECTION    . CHOLECYSTECTOMY N/A 01/04/2015   Procedure: LAPAROSCOPIC CHOLECYSTECTOMY WITH INTRAOPERATIVE CHOLANGIOGRAM;  Surgeon: Erroll Luna, MD;  Location: [place];  Service: General;  Laterality: N/A;  . COLONOSCOPY    . ESOPHAGOGASTRODUODENOSCOPY    . WISDOM TOOTH EXTRACTION      There were no vitals filed for this visit.  Subjective Assessment - 02/10/19 1105    Subjective  Overall my swelling has gotten some better. It  still of course fluctuates but I'm learning that's the nature of it and I just have to keep taking care of it (doing self MLD). I have my first mammogram next week since being diagnosed and I'm really nervous about that.     Pertinent History  05/07/18 Left lumpectomy: IDC grade 1, 1.2 cm, intermediate grade DCIS, margins negative,PASH, 0/7 sentinel lymph nodes negative, ER 95%, PR 95%, Ki-67 1%, HER-2 negative ratio 1.47, T1CN0 stage Ia, no chemo needed, radiation completed 08/10/18 with boost added, firomyalgia, depression    Patient Stated Goals  get rid of swelling    Currently in Pain?  No/denies                       [facility] Adult PT Treatment/Exercise - 02/10/19 0001      Manual Therapy   Manual Lymphatic Drainage (MLD)  In supine: short neck, superficial and deep abdominal, right axillary nodes, anterior interaxillary anastamosis, left inguinal nodes and left axillo-inguinal anastamosis, left breast, then to right sidelying for more work on left breast and posterior interaxillary anastamosis then finished retracing anastomosis in supine reviewing pts technique during session.                   PT Long Term Goals - 01/18/19 1109  PT LONG TERM GOAL #1   Title  Pt will decrease breast size by at least 50% subjectively and per picture    Baseline  Pt reports 25% as of now-12/02/18, only about 15% now since flare up-01/18/19    Status  On-going      PT LONG TERM GOAL #2   Title  Pt will report no breast pain at the end of the day    Baseline  Lt breast is much improved by end of day being only mildly painful-12/02/18; 2-3/10 pain now with increased fullness-01/18/19    Status  On-going      PT LONG TERM GOAL #3   Title  pt will ind with self breast MLD    Baseline  Pt and husband are independent with this now-12/02/18; pt needs review now as it's been almost 3 months since being here and doing intermittently at home-01/18/19    Status  Partially Met      PT LONG  TERM GOAL #4   Title  Pt will be aware of lymphedema signs and symptoms as well as precautions     Status  Achieved            Plan - 02/10/19 1157    Clinical Impression Statement  Reviewed pts technique with her today and she is still applying too much pressure so reviewed with her correct technique of using less pressure and stretching the skin only, not sliding or using any downward force, only horizontal glides. Pt reports will continue to work on this at home and will try 2x/day until next session to see if she notices any further improvement of swelling. Pt and therapist agreed it would beneficial for pt to come one more time after her mammogram next week for final reassess to see how her breast tissue tolerated the compression of mammogram. Pt was very relieved to come for one more final visit to be assessed after mammogram as she is very nervous with how it will go.  Did not reapply kinesiotape today as pt had just removed hers yesterday and skin still red from this.     Personal Factors and Comorbidities  Comorbidity 1    Comorbidities  radiation history    Stability/Clinical Decision Making  Evolving/Moderate complexity   new swelling   Rehab Potential  Excellent    PT Frequency  1x / week    PT Duration  4 weeks    PT Treatment/Interventions  Manual lymph drainage;Manual techniques;Taping;Patient/family education;Therapeutic exercise;ADLs/Self Care Home Management    PT Next Visit Plan  D/C next visit, take picture of breast for chart and assess goals; review manual lymph drainage of Lt breast and cont kinesiotape prn.     Consulted and Agree with Plan of Care  Patient       Patient will benefit from skilled therapeutic intervention in order to improve the following deficits and impairments:  Pain, Increased edema  Visit Diagnosis: Lymphedema, not elsewhere classified  Disorder of the skin and subcutaneous tissue related to radiation, unspecified     Problem  List Patient Active Problem List   Diagnosis Date Noted  . Genetic testing 04/16/2018  . Malignant neoplasm of upper-outer quadrant of left breast in female, estrogen receptor positive ([place]) 03/25/2018  . Family history of breast cancer 03/24/2018  . Anemia, iron deficiency 10/20/2014  . Chest pain   . Palpitations   . Neurofibromatosis ([place])   . Tachycardia   . Hx of migraines   . H/O: depression   .  Mitral regurgitation   . BLOOD IN STOOL 01/12/2009  . BLOOD IN [place], OCCULT 01/12/2009    Otelia Limes, PTA 02/10/2019, 12:08 PM  [place], [place], [address] Phone: [phone]   Fax:  [phone]  Name: Anne Horton MRN: [id number] Date of Birth: Jun 13, 1976

## 2019-02-18 ENCOUNTER — Ambulatory Visit: Payer: 59 | Attending: Radiation Oncology

## 2019-02-18 ENCOUNTER — Other Ambulatory Visit: Payer: Self-pay

## 2019-02-18 DIAGNOSIS — M25612 Stiffness of left shoulder, not elsewhere classified: Secondary | ICD-10-CM | POA: Insufficient documentation

## 2019-02-18 DIAGNOSIS — I890 Lymphedema, not elsewhere classified: Secondary | ICD-10-CM | POA: Diagnosis not present

## 2019-02-18 DIAGNOSIS — L599 Disorder of the skin and subcutaneous tissue related to radiation, unspecified: Secondary | ICD-10-CM | POA: Insufficient documentation

## 2019-02-18 DIAGNOSIS — M25512 Pain in left shoulder: Secondary | ICD-10-CM | POA: Insufficient documentation

## 2019-02-18 DIAGNOSIS — Z483 Aftercare following surgery for neoplasm: Secondary | ICD-10-CM | POA: Insufficient documentation

## 2019-02-18 NOTE — Therapy (Addendum)
[place], [place], [address] Phone: [phone]   Fax:  [phone]  Physical Therapy Treatment  Patient Details  Name: Anne Horton MRN: [id number] Date of Birth: 1975-10-12 Referring Provider (PT): Dr. Sondra Come   Encounter Date: 02/18/2019  PT End of Session - 02/18/19 1204    Visit Number  10    Number of Visits  13    Date for PT Re-Evaluation  02/15/19    Authorization Type  cone insurance ; medical necessity    PT Start Time  1107    PT Stop Time  1154    PT Time Calculation (min)  47 min    Activity Tolerance  Patient tolerated treatment well    Behavior During Therapy  [facility] for tasks assessed/performed       Past Medical History:  Diagnosis Date  . Allergy   . Anemia   . Anxiety   . Cancer ([place])   . Chest pain   . Depression   . Family history of breast cancer   . Fibromyalgia    neurofibromatosis  . H/O: depression   . Hx of migraines   . Mitral regurgitation    mild per echo EF 55-60%  . Neurofibromatosis ([place])   . Palpitations   . Tachycardia    Hx. of  . Wears contact lenses     Past Surgical History:  Procedure Laterality Date  . BREAST LUMPECTOMY WITH RADIOACTIVE SEED AND SENTINEL LYMPH NODE BIOPSY Left 05/07/2018   Procedure: BREAST LUMPECTOMY WITH RADIOACTIVE SEED AND SENTINEL LYMPH NODE BIOPSY;  Surgeon: Rolm Bookbinder, MD;  Location: [place];  Service: General;  Laterality: Left;  . CESAREAN SECTION    . CHOLECYSTECTOMY N/A 01/04/2015   Procedure: LAPAROSCOPIC CHOLECYSTECTOMY WITH INTRAOPERATIVE CHOLANGIOGRAM;  Surgeon: Erroll Luna, MD;  Location: [place];  Service: General;  Laterality: N/A;  . COLONOSCOPY    . ESOPHAGOGASTRODUODENOSCOPY    . WISDOM TOOTH EXTRACTION      There were no vitals filed for this visit.  Subjective Assessment - 02/18/19 1110    Subjective  My Lt breast was doing really well for the first  half of the week but then I tried a sports bra to see if it gave me more compression and it helped the first couple days and then my breast started swelling more so I went back to the compression bra so I don't know. I did order some new bras though so we'll see if that helps.     Pertinent History  05/07/18 Left lumpectomy: IDC grade 1, 1.2 cm, intermediate grade DCIS, margins negative,PASH, 0/7 sentinel lymph nodes negative, ER 95%, PR 95%, Ki-67 1%, HER-2 negative ratio 1.47, T1CN0 stage Ia, no chemo needed, radiation completed 08/10/18 with boost added, firomyalgia, depression    Patient Stated Goals  get rid of swelling                         [facility] Adult PT Treatment/Exercise - 02/18/19 0001      Manual Therapy   Manual therapy comments       Edema Management  Made foam chip pack and issued rectangle piece of small dotted peach Medi foam for pt to try both with wear of her compression bra during day (chip pack at night as this is bulky)    Manual Lymphatic Drainage (MLD)  In supine: short neck, superficial and deep abdominal, right axillary  nodes, anterior interaxillary anastamosis, left inguinal nodes and left axillo-inguinal anastamosis, left breast, then to right sidelying for more work on left breast and posterior interaxillary anastamosis then finished retracing anastomosis in supine reviewing pts technique during session.     Kinesiotex  Edema      Kinesiotix   Edema  2 pieces of kinesiotape used today, 1 along each anastomosis: at anterior inter-axillary 2 fingers running from Rt shoulder to medial breast, then 2nd running along Lt axillo-inguinal anastomosis with 3 fingers running up to lateral breast instructing pt while applying             PT Education - 02/18/19 1204    Education Details  Reviewing MLD sequence while perfomring one more time to answer pts questions and instruction of kinesiotape    Person(s) Educated  Patient    Methods   Explanation;Demonstration    Comprehension  Verbalized understanding          PT Long Term Goals - 02/18/19 1205      PT LONG TERM GOAL #1   Title  Pt will decrease breast size by at least 50% subjectively and per picture    Baseline  Pt reports 25% as of now-12/02/18, only about 15% now since flare up-01/18/19; Overall about 25% improvement since start of care-02/18/19    Status  Partially Met      PT LONG TERM GOAL #2   Title  Pt will report no breast pain at the end of the day    Baseline  Lt breast is much improved by end of day being only mildly painful-12/02/18; 2-3/10 pain now with increased fullness-01/18/19; pt has no c/o pain now-02/18/19    Status  Achieved      PT LONG TERM GOAL #3   Title  pt will ind with self breast MLD    Baseline  Pt and husband are independent with this now-12/02/18; pt needs review now as it's been almost 3 months since being here and doing intermittently at home-01/18/19; pt now back to doing this 1-2x/day for at least 30 mins a day or until she notes softening of breast-02/18/19    Status  Achieved      PT LONG TERM GOAL #4   Title  Pt will be aware of lymphedema signs and symptoms as well as precautions     Status  Achieved            Plan - 02/18/19 1207    Clinical Impression Statement  Pt has overall done excellent with this episode of care. She has been eager to learn anything she can to better manage her lymphedema. She wears her ocmpression bra daily and has ordered more, has compression foam to wear in her bra, her and her husband are performing MLD daily and she is knowledgeable about use/proper application of kinesiotape. Pt has met all goals but one, her report of visible breast reduction did imporve but not to 50%. Pt is ready for D/C at this time.     Personal Factors and Comorbidities  Comorbidity 1    Comorbidities  radiation history    Stability/Clinical Decision Making  Evolving/Moderate complexity   new swelling   Rehab Potential   Excellent    PT Frequency  1x / week    PT Duration  4 weeks    PT Treatment/Interventions  Manual lymph drainage;Manual techniques;Taping;Patient/family education;Therapeutic exercise;ADLs/Self Care Home Management    PT Next Visit Plan  D/C this visit.  Consulted and Agree with Plan of Care  Patient       Patient will benefit from skilled therapeutic intervention in order to improve the following deficits and impairments:  Pain, Increased edema  Visit Diagnosis: Lymphedema, not elsewhere classified  Disorder of the skin and subcutaneous tissue related to radiation, unspecified  Stiffness of left shoulder, not elsewhere classified  Aftercare following surgery for neoplasm  Acute pain of left shoulder     Problem List Patient Active Problem List   Diagnosis Date Noted  . Genetic testing 04/16/2018  . Malignant neoplasm of upper-outer quadrant of left breast in female, estrogen receptor positive ([place]) 03/25/2018  . Family history of breast cancer 03/24/2018  . Anemia, iron deficiency 10/20/2014  . Chest pain   . Palpitations   . Neurofibromatosis ([place])   . Tachycardia   . Hx of migraines   . H/O: depression   . Mitral regurgitation   . BLOOD IN STOOL 01/12/2009  . BLOOD IN STOOL, OCCULT 01/12/2009    Otelia Limes, PTA 02/18/2019, 2:15 PM  [place], [place], [address] Phone: [phone]   Fax:  [phone]  Name: Anne Horton MRN: [id number] Date of Birth: [birth year]  PHYSICAL THERAPY DISCHARGE SUMMARY  Visits from Start of Care: 10  Current functional level related to goals / functional outcomes: See above   Remaining deficits: Need for continued home treatment   Education / Equipment: MLD, compression Plan: Patient agrees to discharge.  Patient goals were partially met. Patient is being discharged due to being pleased with the current functional level.   ?????     Shan Levans, PT

## 2019-03-04 MED FILL — VENLAFAXINE HCL ER 75 MG CA: 75 | 45 days supply | Qty: 90 | Fill #3

## 2019-03-04 MED FILL — ALPRAZolam 0.5 MG TABS: 0.5 | 30 days supply | Qty: 60 | Fill #2

## 2019-03-04 MED FILL — TAMOXIFEN CITRATE 20 MG TAB: 20 | 90 days supply | Qty: 90 | Fill #2

## 2019-03-15 ENCOUNTER — Ambulatory Visit
Admission: RE | Admit: 2019-03-15 | Discharge: 2019-03-15 | Disposition: A | Discharge status: Home / Self Care | Payer: 59 | Source: Ambulatory Visit | Attending: Hematology and Oncology | Admitting: Hematology and Oncology

## 2019-03-15 ENCOUNTER — Other Ambulatory Visit: Payer: Self-pay

## 2019-03-15 DIAGNOSIS — C50412 Malignant neoplasm of upper-outer quadrant of left female breast: Secondary | ICD-10-CM

## 2019-03-15 DIAGNOSIS — R922 Inconclusive mammogram: Secondary | ICD-10-CM | POA: Diagnosis not present

## 2019-03-15 HISTORY — DX: Personal history of irradiation: Z92.3

## 2019-04-06 MED ORDER — METOPROLOL SUCCINATE ER 50 MG PO TB24: ORAL_TABLET | ORAL | 0 refills | Status: DC

## 2019-04-06 MED FILL — METOPROLOL SUCCINATE ER 50: 50 | 30 days supply | Qty: 30 | Fill #0

## 2019-04-06 NOTE — Telephone Encounter (Signed)
Pt's medication was sent to pt's pharmacy as requested. Confirmation received.  °

## 2019-04-06 NOTE — Addendum Note (Signed)
Addended by: Derl Barrow on: 04/06/2019 12:04 PM   Modules accepted: Orders

## 2019-04-07 ENCOUNTER — Telehealth: Payer: Self-pay | Admitting: Adult Health

## 2019-04-07 NOTE — Telephone Encounter (Signed)
Patient request reschedule

## 2019-04-15 ENCOUNTER — Encounter: Payer: 59 | Admitting: Adult Health

## 2019-04-29 ENCOUNTER — Other Ambulatory Visit: Payer: Self-pay

## 2019-04-29 ENCOUNTER — Encounter: Payer: Self-pay | Admitting: Radiation Oncology

## 2019-04-29 ENCOUNTER — Inpatient Hospital Stay
Admission: RE | Admit: 2019-04-29 | Discharge: 2019-04-29 | Disposition: A | Discharge status: Home / Self Care | Payer: 59 | Source: Ambulatory Visit | Attending: Radiation Oncology | Admitting: Radiation Oncology

## 2019-04-29 VITALS — BP 131/71 | HR 76 | Temp 98.3°F | Resp 16 | Ht 62.0 in | Wt 140.8 lb

## 2019-04-29 DIAGNOSIS — C50412 Malignant neoplasm of upper-outer quadrant of left female breast: Secondary | ICD-10-CM | POA: Diagnosis not present

## 2019-04-29 DIAGNOSIS — I890 Lymphedema, not elsewhere classified: Secondary | ICD-10-CM | POA: Diagnosis not present

## 2019-04-29 DIAGNOSIS — Z08 Encounter for follow-up examination after completed treatment for malignant neoplasm: Secondary | ICD-10-CM | POA: Diagnosis not present

## 2019-04-29 DIAGNOSIS — Z170 Estrogen receptor positive status [ER+]: Secondary | ICD-10-CM

## 2019-04-29 NOTE — Progress Notes (Signed)
Pt presents today for f/u with Dr. Sondra Come. Pt continues to have lymphedema in LEFT breast/axilla/arm. Pt reports fatigue has improved. Pt reports pain in breast, rated "3-4"/10 and describes it as "achy and dull". Pt is not using any specific moisturizer on breast. Breast is slightly hyperpigmented and swollen.  BP 131/71 (BP Location: Left Arm, Patient Position: Sitting)   Pulse 76   Temp 98.3 F (36.8 C) (Oral)   Resp 16   Ht 5\' 2"  (1.575 m)   Wt 140 lb 12.8 oz (63.9 kg)   BMI 25.75 kg/m   Wt Readings from Last 3 Encounters:  04/29/19 140 lb 12.8 oz (63.9 kg)  11/10/18 139 lb (63 kg)  09/03/18 143 lb (64.9 kg)   Loma Sousa, RN BSN

## 2019-04-29 NOTE — Progress Notes (Signed)
Radiation Oncology         [phone] ________________________________  Name: Anne Horton [facility] MRN: [id number]  Date: 04/29/2019  DOB: 1976-08-26  Follow-Up Visit Note  CC: [name], MD    ICD-10-CM   1. Malignant neoplasm of upper-outer quadrant of left breast in female, estrogen receptor positive ([place])  C50.412    Z17.0     Diagnosis:   43 y.o. female with Stage I invasive ductal carcinoma of the left breast (pT1c, pN0)  Interval Since Last Radiation:  9 months   Radiation treatment dates:   06/24/2018 to 08/10/2018  Site/dose:    1. The Left breast was treated to 50.4 Gy in 28 fractions of 1.8 Gy. 2. The Left breast was boosted to 10 Gy in 5 fractions of 2 Gy.  Narrative:  The patient returns today for routine follow-up.  Since her last follow-up, she had a diagnostic bilateral mammogram done on 03/15/2019 which did not show any mammographic evidence of malignancy in either breast. Interval left breast post-treatment changes were noted.                             On review of systems, the patient continues to have lymphedema in her left breast/axilla/arm. She reports fatigue has improved. She is not using any specific moisturizer on her breast. She reports left breast is slightly hyperpigmented and swollen. She has undergone physical therapy for her breast edema. She has felt this has helped some with the edema, but she continues to have pain on level 3-4/10 in the left breast and describes it as achy and dull. She was instructed by physical therapy to tape the breast to help with the edema but has not been consistent with this after her trip to [place] to help her daughter move for school.  ALLERGIES:  is allergic to latex.  Meds: Current Outpatient Medications  Medication Sig Dispense Refill  . acetaminophen (TYLENOL) 500 MG tablet Take 500 mg by mouth every 6 (six) hours as needed.    . ALPRAZolam (XANAX) 0.5 MG tablet Take 0.5 mg by  mouth at bedtime as needed (FOR ANXIETY). :take 1/2 to 1 twice a day as needed    . cetirizine (ZYRTEC) 10 MG tablet Take 10 mg by mouth as needed for allergies.     Marland Kitchen ibuprofen (ADVIL,MOTRIN) 200 MG tablet Take 800 mg by mouth daily as needed for headache.    . metoprolol succinate (TOPROL-XL) 50 MG 24 hr tablet TAKE 1 TABLET BY MOUTH DAILY. TAKE WITH OR IMMEDIATELY FOLLOWING A MEAL. Please keep upcoming appt with Dr. Acie Fredrickson. Thank you 90 tablet 0  . propranolol (INDERAL) 10 MG tablet TAKE 1 TABLET BY MOUTH FOUR TIMES DAILY AS NEEDED FOR PALPITATIONS 120 tablet 3  . tamoxifen (NOLVADEX) 20 MG tablet Take 1 tablet (20 mg total) by mouth daily. 90 tablet 3  . venlafaxine XR (EFFEXOR-XR) 150 MG 24 hr capsule Take 150 mg by mouth daily with breakfast.    . prochlorperazine (COMPAZINE) 10 MG tablet Take 1 tablet (10 mg total) by mouth every 6 (six) hours as needed for nausea or vomiting. (Patient not taking: Reported on 04/29/2019) 30 tablet 0   No current facility-administered medications for this encounter.     Physical Findings: The patient is in no acute distress. Patient is alert and oriented.  height is 5\' 2"  (1.575 m) and weight is 140 lb 12.8 oz (  63.9 kg). Her oral temperature is 98.3 F (36.8 C). Her blood pressure is 131/71 and her pulse is 76. Her respiration is 16.   Lungs are clear to auscultation bilaterally. Heart has regular rate and rhythm. No palpable cervical, supraclavicular, or axillary adenopathy. Abdomen soft, non-tender, normal bowel sounds.  Breast Exam Right Breast: Large and pendulous without mass, nipple discharge or bleeding. Left Breast: Patient continues to have edema in the breast and hyperpigmentation changes. Edema seems to be a little less than my on previous exam. No dominant mass appreciated in the breast, nipple discharge or bleeding.   Lab Findings: Lab Results  Component Value Date   WBC 7.3 08/03/2018   HGB 13.2 08/03/2018   HCT 42.1 08/03/2018   MCV  84.0 08/03/2018   PLT 404 (H) 08/03/2018    Radiographic Findings: No results found.  Impression:  Stage I invasive ductal carcinoma of the left breast (pT1c, pN0). No evidence of recurrence on clinical exam or recent mammogram (June 29th). Patient continues to have lymphedema in the breast which has been minimally helped with physical therapy. Patient will resume recommendations at home given to her by her physical therapist.   Plan:  The patent will continue on tamoxifen. Patient will return for follow-up in 3 months.  ____________________________________  Blair Promise, PhD, MD  This document serves as a record of services personally performed by [name], MD. It was created on his behalf by Rae Lips, a trained medical scribe. The creation of this record is based on the scribe's personal observations and the provider's statements to them. This document has been checked and approved by the attending provider.

## 2019-04-29 NOTE — Patient Instructions (Signed)
Coronavirus (COVID-19) Are you at risk?  Are you at risk for the Coronavirus (COVID-19)?  To be considered HIGH RISK for Coronavirus (COVID-19), you have to meet the following criteria:  . Traveled to [place], [place], [place], [place] or [place]; or in the [place] to [place], [place], [place], or [place]; and have fever, cough, and shortness of breath within the last 2 weeks of travel OR . Been in close contact with a person diagnosed with COVID-19 within the last 2 weeks and have fever, cough, and shortness of breath . IF YOU DO NOT MEET THESE CRITERIA, YOU ARE CONSIDERED LOW RISK FOR COVID-19.  What to do if you are HIGH RISK for COVID-19?  . If you are having a medical emergency, call 911. . Seek medical care right away. Before you go to a doctor's office, urgent care or emergency department, call ahead and tell them about your recent travel, contact with someone diagnosed with COVID-19, and your symptoms. You should receive instructions from your physician's office regarding next steps of care.  . When you arrive at healthcare provider, tell the healthcare staff immediately you have returned from visiting [place], [place], [place], [place] or [place]; or traveled in the [place] to [place], [place], [place], or [place]; in the last two weeks or you have been in close contact with a person diagnosed with COVID-19 in the last 2 weeks.   . Tell the health care staff about your symptoms: fever, cough and shortness of breath. . After you have been seen by a medical provider, you will be either: o Tested for (COVID-19) and discharged home on quarantine except to seek medical care if symptoms worsen, and asked to  - Stay home and avoid contact with others until you get your results (4-5 days)  - Avoid travel on public transportation if possible (such as bus, train, or airplane) or o Sent to the Emergency Department by EMS for evaluation, COVID-19 testing, and possible  admission depending on your condition and test results.  What to do if you are LOW RISK for COVID-19?  Reduce your risk of any infection by using the same precautions used for avoiding the common cold or flu:  . Wash your hands often with soap and warm water for at least 20 seconds.  If soap and water are not readily available, use an alcohol-based hand sanitizer with at least 60% alcohol.  . If coughing or sneezing, cover your mouth and nose by coughing or sneezing into the elbow areas of your shirt or coat, into a tissue or into your sleeve (not your hands). . Avoid shaking hands with others and consider head nods or verbal greetings only. . Avoid touching your eyes, nose, or mouth with unwashed hands.  . Avoid close contact with people who are sick. . Avoid places or events with large numbers of people in one location, like concerts or sporting events. . Carefully consider travel plans you have or are making. . If you are planning any travel outside or inside the [place], visit the CDC's Travelers' Health webpage for the latest health notices. . If you have some symptoms but not all symptoms, continue to monitor at home and seek medical attention if your symptoms worsen. . If you are having a medical emergency, call 911.   ADDITIONAL HEALTHCARE OPTIONS FOR PATIENTS  [facility] / e-Visit: [email or web address]         [facility]: [phone]  [facility]: [phone]                   [facility]: [phone]   

## 2019-04-30 MED FILL — METOPROLOL SUCCINATE ER 50: 50 | 90 days supply | Qty: 90 | Fill #0

## 2019-05-03 ENCOUNTER — Inpatient Hospital Stay: Payer: 59 | Attending: Adult Health | Admitting: Adult Health

## 2019-05-03 ENCOUNTER — Encounter: Payer: Self-pay | Admitting: Adult Health

## 2019-05-03 ENCOUNTER — Other Ambulatory Visit: Payer: Self-pay

## 2019-05-03 VITALS — BP 134/66 | HR 86 | Temp 98.5°F | Resp 18 | Ht 62.0 in | Wt 143.0 lb

## 2019-05-03 DIAGNOSIS — Z79899 Other long term (current) drug therapy: Secondary | ICD-10-CM | POA: Insufficient documentation

## 2019-05-03 DIAGNOSIS — N644 Mastodynia: Secondary | ICD-10-CM | POA: Insufficient documentation

## 2019-05-03 DIAGNOSIS — Z803 Family history of malignant neoplasm of breast: Secondary | ICD-10-CM | POA: Insufficient documentation

## 2019-05-03 DIAGNOSIS — N6320 Unspecified lump in the left breast, unspecified quadrant: Secondary | ICD-10-CM | POA: Insufficient documentation

## 2019-05-03 DIAGNOSIS — I890 Lymphedema, not elsewhere classified: Secondary | ICD-10-CM | POA: Diagnosis not present

## 2019-05-03 DIAGNOSIS — Z170 Estrogen receptor positive status [ER+]: Secondary | ICD-10-CM | POA: Insufficient documentation

## 2019-05-03 DIAGNOSIS — Z79810 Long term (current) use of selective estrogen receptor modulators (SERMs): Secondary | ICD-10-CM | POA: Diagnosis not present

## 2019-05-03 DIAGNOSIS — C50412 Malignant neoplasm of upper-outer quadrant of left female breast: Secondary | ICD-10-CM | POA: Insufficient documentation

## 2019-05-03 DIAGNOSIS — Z923 Personal history of irradiation: Secondary | ICD-10-CM | POA: Diagnosis not present

## 2019-05-03 DIAGNOSIS — R232 Flushing: Secondary | ICD-10-CM | POA: Diagnosis not present

## 2019-05-03 NOTE — Progress Notes (Signed)
SURVIVORSHIP VISIT:   BRIEF ONCOLOGIC HISTORY:  Oncology History  Malignant neoplasm of upper-outer quadrant of left breast in female, estrogen receptor positive ([place])  03/20/2018 Initial Diagnosis   Screening detected left breast spiculated mass by ultrasound measured 1.2 cm.  Biopsy revealed invasive ductal carcinoma grade 1-2 with high-grade DCIS, ER 95%, PR 95%, Ki-67 1%, HER-2 negative ratio 1.47, T1CN0 stage I a clinical stage   03/20/2018 Cancer Staging   Staging form: Breast, AJCC 8th Edition - Clinical stage from 03/20/2018: Stage IA (cT1c, cN0, cM0, G2, ER+, PR+, HER2-) - Signed by Gardenia Phlegm, NP on 05/27/2018   04/08/2018 Genetic Testing   VUS in NF1, otherwise negative.  Genes tested: APC, ATM, AXIN2, BARD1, BMPR1A, BRCA1, BRCA2, BRIP1, CDH1, CDKN2A (p14ARF), CDKN2A (p16INK4a), CKD4, CHEK2, CTNNA1, DICER1, EPCAM (Deletion/duplication testing only), GREM1 (promoter region deletion/duplication testing only), KIT, MEN1, MLH1, MSH2, MSH3, MSH6, MUTYH, NBN, NF1, NHTL1, PALB2, PDGFRA, PMS2, POLD1, POLE, PTEN, RAD50, RAD51C, RAD51D, SDHB, SDHC, SDHD, SMAD4, SMARCA4. STK11, TP53, TSC1, TSC2, and VHL.  The following genes were evaluated for sequence changes only: SDHA and HOXB13 c.251G>A variant only.   05/07/2018 Surgery   Left lumpectomy: IDC grade 1, 1.2 cm, intermediate grade DCIS, margins negative,PASH, 0/7 sentinel lymph nodes negative, ER 95%, PR 95%, Ki-67 1%, HER-2 negative ratio 1.47, T1CN0 stage Ia   05/07/2018 Cancer Staging   Staging form: Breast, AJCC 8th Edition - Pathologic stage from 05/07/2018: Stage IA (pT1c, pN0, cM0, G1, ER+, PR+, HER2-, Oncotype DX score: 11) - Signed by Gardenia Phlegm, NP on 05/27/2018   05/25/2018 Oncotype testing   Oncotype DX score 11: 3% risk of distant recurrence of 9 years, low risk   06/24/2018 - 08/03/2018 Radiation Therapy   Adjuvant radiation therapy   09/2018 -  Anti-estrogen oral therapy   Tamoxifen daily     INTERVAL  HISTORY:  Anne Horton to review her survivorship care plan detailing her treatment course for breast cancer, as well as monitoring long-term side effects of that treatment, education regarding health maintenance, screening, and overall wellness and health promotion.     Overall, Anne Horton reports feeling quite well.  She continues on Tamoxifen daily.  She is tolerating it well.  She has tolerable hot flashes.  She denies arthralgias or vaginal discharge.    Her main issue is left breast pain and swelling.  She was seeing PT, however stopped due to coronovirus.  She saw Dr. Sondra Come last week and was re referred.  She has not yet been contacted by them.    Since her last visit she underwent bilateral diagnostic mammogram with CAD and TOMO.  It showed no mammographic evidence of malignancy, breast density category C.  Repeat bilateral diagnostic mammogram was recommended in 1 year.    REVIEW OF SYSTEMS:  Review of Systems  Constitutional: Negative for appetite change, chills, fatigue, fever and unexpected weight change.  HENT:   Negative for hearing loss, lump/mass and trouble swallowing.   Eyes: Negative for eye problems and icterus.  Respiratory: Negative for chest tightness, cough and shortness of breath.   Cardiovascular: Negative for chest pain, leg swelling and palpitations.  Gastrointestinal: Negative for abdominal distention, abdominal pain, constipation, diarrhea, nausea and vomiting.  Endocrine: Positive for hot flashes.  Genitourinary: Negative for difficulty urinating.   Musculoskeletal: Negative for arthralgias and back pain.  Skin: Negative for itching and rash.  Neurological: Negative for dizziness, extremity weakness, headaches and numbness.  Hematological: Negative for adenopathy. Does not bruise/bleed easily.  Psychiatric/Behavioral: Negative for depression. The patient is not nervous/anxious.   Breast: Denies any new nodularity, masses, tenderness, nipple changes, or nipple  discharge.      ONCOLOGY TREATMENT TEAM:  1. Surgeon:  Dr. Donne Hazel at [facility] 2. Medical Oncologist: Dr. Lindi Adie  3. Radiation Oncologist: Dr. Sondra Come    PAST MEDICAL/SURGICAL HISTORY:  Past Medical History:  Diagnosis Date  . Allergy   . Anemia   . Anxiety   . Breast cancer ([place]) 05/07/2018   left invasive ductal   . Cancer ([place])   . Chest pain   . Depression   . Family history of breast cancer   . Fibromyalgia    neurofibromatosis  . H/O: depression   . Hx of migraines   . Mitral regurgitation    mild per echo EF 55-60%  . Neurofibromatosis ([place])   . Palpitations   . Personal history of radiation therapy   . Tachycardia    Hx. of  . Wears contact lenses    Past Surgical History:  Procedure Laterality Date  . BREAST BIOPSY    . BREAST LUMPECTOMY Left 05/07/2018   Invasive ductal   . BREAST LUMPECTOMY WITH RADIOACTIVE SEED AND SENTINEL LYMPH NODE BIOPSY Left 05/07/2018   Procedure: BREAST LUMPECTOMY WITH RADIOACTIVE SEED AND SENTINEL LYMPH NODE BIOPSY;  Surgeon: Rolm Bookbinder, MD;  Location: [place];  Service: General;  Laterality: Left;  . CESAREAN SECTION    . CHOLECYSTECTOMY N/A 01/04/2015   Procedure: LAPAROSCOPIC CHOLECYSTECTOMY WITH INTRAOPERATIVE CHOLANGIOGRAM;  Surgeon: Erroll Luna, MD;  Location: [place];  Service: General;  Laterality: N/A;  . COLONOSCOPY    . ESOPHAGOGASTRODUODENOSCOPY    . WISDOM TOOTH EXTRACTION       ALLERGIES:  Allergies  Allergen Reactions  . Latex Rash     CURRENT MEDICATIONS:  Outpatient Encounter Medications as of 05/03/2019  Medication Sig Note  . acetaminophen (TYLENOL) 500 MG tablet Take 500 mg by mouth every 6 (six) hours as needed.   . ALPRAZolam (XANAX) 0.5 MG tablet Take 0.5 mg by mouth at bedtime as needed (FOR ANXIETY). :take 1/2 to 1 twice a day as needed 11/03/2014: Received from: [facility]  . cetirizine (ZYRTEC) 10 MG tablet Take 10 mg by mouth  as needed for allergies.    Marland Kitchen ibuprofen (ADVIL,MOTRIN) 200 MG tablet Take 800 mg by mouth daily as needed for headache.   . metoprolol succinate (TOPROL-XL) 50 MG 24 hr tablet TAKE 1 TABLET BY MOUTH DAILY. TAKE WITH OR IMMEDIATELY FOLLOWING A MEAL. Please keep upcoming appt with Dr. Acie Fredrickson. Thank you   . prochlorperazine (COMPAZINE) 10 MG tablet Take 1 tablet (10 mg total) by mouth every 6 (six) hours as needed for nausea or vomiting. (Patient not taking: Reported on 04/29/2019)   . propranolol (INDERAL) 10 MG tablet TAKE 1 TABLET BY MOUTH FOUR TIMES DAILY AS NEEDED FOR PALPITATIONS   . tamoxifen (NOLVADEX) 20 MG tablet Take 1 tablet (20 mg total) by mouth daily.   Marland Kitchen venlafaxine XR (EFFEXOR-XR) 150 MG 24 hr capsule Take 150 mg by mouth daily with breakfast.    No facility-administered encounter medications on file as of 05/03/2019.      ONCOLOGIC FAMILY HISTORY:  Family History  Problem Relation Age of Onset  . Breast cancer Mother 36       estrogen negative, recurred in 2012  . Diabetes Mother   . Neurofibromatosis Father   . Atrial fibrillation Maternal Grandfather   .  Cancer Paternal Grandmother        type unk, died at 61  . Neurofibromatosis Paternal Grandmother   . Stroke Paternal Grandfather   . Heart disease Paternal Uncle   . Diabetes Paternal Uncle   . Heart attack Neg Hx   . Hypertension Neg Hx      GENETIC COUNSELING/TESTING: VUS in NF1, otherwise negative.    SOCIAL HISTORY:  Social History   Socioeconomic History  . Marital status: Married    Spouse name: Not on file  . Number of children: 1  . Years of education: Not on file  . Highest education level: Not on file  Occupational History  . Occupation: [employer]  Social Needs  . Financial resource strain: Not on file  . Food insecurity    Worry: Not on file    Inability: Not on file  . Transportation needs    Medical: Not on file    Non-medical: Not on file  Tobacco Use  . Smoking status: Never Smoker  .  Smokeless tobacco: Never Used  Substance and Sexual Activity  . Alcohol use: Yes    Alcohol/week: 0.0 standard drinks    Comment: occasional but rarely  . Drug use: No  . Sexual activity: Yes    Birth control/protection: None  Lifestyle  . Physical activity    Days per week: Not on file    Minutes per session: Not on file  . Stress: Not on file  Relationships  . Social Herbalist on phone: Not on file    Gets together: Not on file    Attends religious service: Not on file    Active member of club or organization: Not on file    Attends meetings of clubs or organizations: Not on file    Relationship status: Not on file  . Intimate partner violence    Fear of current or ex partner: Not on file    Emotionally abused: Not on file    Physically abused: Not on file    Forced sexual activity: Not on file  Other Topics Concern  . Not on file  Social History Narrative   Pt lives with husband and daughter     OBSERVATIONS/OBJECTIVE:  BP 134/66 (BP Location: Right Arm, Patient Position: Sitting)   Pulse 86   Temp 98.5 F (36.9 C) (Oral)   Resp 18   Ht '5\' 2"'$  (1.575 m)   Wt 143 lb (64.9 kg)   SpO2 100%   BMI 26.16 kg/m  GENERAL: Patient is a well appearing female in no acute distress HEENT:  Sclerae anicteric.  Oropharynx clear and moist. No ulcerations or evidence of oropharyngeal candidiasis. Neck is supple.  NODES:  No cervical, supraclavicular, or axillary lymphadenopathy palpated.  BREAST EXAM:  Right breast benign, left breast is large and swollen LUNGS:  Clear to auscultation bilaterally.  No wheezes or rhonchi. HEART:  Regular rate and rhythm. No murmur appreciated. ABDOMEN:  Soft, nontender.  Positive, normoactive bowel sounds. No organomegaly palpated. MSK:  No focal spinal tenderness to palpation. Full range of motion bilaterally in the upper extremities. EXTREMITIES:  No peripheral edema.   SKIN:  Clear with no obvious rashes or skin changes. No nail  dyscrasia. NEURO:  Nonfocal. Well oriented.  Appropriate affect.    LABORATORY DATA:  None for this visit.  DIAGNOSTIC IMAGING:  None for this visit.      ASSESSMENT AND PLAN:  Ms.. Horton is a pleasant 43 y.o. female  with Stage IA left breast invasive ductal carcinoma, ER+/PR+/HER2-, diagnosed in 03/2018, treated with lumpectomy, adjuvant radiation therapy, and anti-estrogen therapy with Tamoxifen beginning in 09/2018.  She presents to the Survivorship Clinic for our initial meeting and routine follow-up post-completion of treatment for breast cancer.    1. Stage IA left breast cancer:  Anne Horton is continuing to recover from definitive treatment for breast cancer. She will follow-up with her medical oncologist, Dr. Lindi Adie in 10/2019 with history and physical exam per surveillance protocol.  She will continue her anti-estrogen therapy with Tamoxifen. Thus far, she is tolerating the Tamoxifen well, with minimal side effects. She was instructed to make Dr. Lindi Adie or myself aware if she begins to experience any worsening side effects of the medication and I could see her back in clinic to help manage those side effects, as needed. Her mammogram is due 02/2020; orders placed today.  Her breast density is category C. Today, a comprehensive survivorship care plan and treatment summary was reviewed with the patient today detailing her breast cancer diagnosis, treatment course, potential late/long-term effects of treatment, appropriate follow-up care with recommendations for the future, and patient education resources.  A copy of this summary, along with a letter will be sent to the patient's primary care provider via mail/fax/In Basket message after today's visit.    2. Left breast lymphedema: I sent Inez Catalina in PT a message to get patient expedited appointment.  Will send in for more compression bras.    3. Bone health:  Given Anne Horton's history of breast cancer, she is at slight risk for bone  demineralization.  I counseled her that tamoxifen has a protective effect on bones.  She was given education on specific activities to promote bone health.  4. Cancer screening:  Due to Anne Horton's history and her age, she should receive screening for skin cancers, colon cancer, and gynecologic cancers.  The information and recommendations are listed on the patient's comprehensive care plan/treatment summary and were reviewed in detail with the patient.    #. Health maintenance and wellness promotion: Anne Horton was encouraged to consume 5-7 servings of fruits and vegetables per day. We reviewed the "Nutrition Rainbow" handout, as well as the handout "Take Control of Your Health and Reduce Your Cancer Risk" from the [facility].  She was also encouraged to engage in moderate to vigorous exercise for 30 minutes per day most days of the week. We discussed the [employer] fitness program, which is designed for cancer survivors to help them become more physically fit after cancer treatments.  She was instructed to limit her alcohol consumption and continue to abstain from tobacco use.     5. Support services/counseling: It is not uncommon for this period of the patient's cancer care trajectory to be one of many emotions and stressors.  We discussed how this can be increasingly difficult during the times of quarantine and social distancing due to the COVID-19 pandemic.   She was given information regarding our available services and encouraged to contact me with any questions or for help enrolling in any of our support group/programs.    Follow up instructions:    -Return to cancer center 10/2019  -Mammogram due in 02/2020 -Follow up with surgery 04/2020 -She is welcome to return back to the Survivorship Clinic at any time; no additional follow-up needed at this time.  -Consider referral back to survivorship as a long-term survivor for continued surveillance  The patient was provided an  opportunity  to ask questions and all were answered. The patient agreed with the plan and demonstrated an understanding of the instructions.   A total of (30) minutes of face-to-face time was spent with this patient with greater than 50% of that time in counseling and care-coordination.   Scot Dock, NP

## 2019-05-04 ENCOUNTER — Telehealth: Payer: Self-pay

## 2019-05-04 NOTE — Telephone Encounter (Signed)
-----   Message from Gardenia Phlegm, NP sent at 05/03/2019 10:32 AM EDT ----- Please call [employer] and get form faxed over to get patient more compression bras.  I will complete it.   Thanks, [place]

## 2019-05-04 NOTE — Telephone Encounter (Signed)
Per Cheryll Dessert to [employer] to request  compression bras for Pt.. if additional form is needed they will request it.

## 2019-05-10 MED FILL — VENLAFAXINE HCL ER 75 MG CA: 75 | 45 days supply | Qty: 90 | Fill #4

## 2019-05-11 ENCOUNTER — Encounter: Payer: Self-pay | Admitting: Physical Therapy

## 2019-05-11 ENCOUNTER — Ambulatory Visit: Payer: 59 | Attending: Radiation Oncology | Admitting: Physical Therapy

## 2019-05-11 DIAGNOSIS — L599 Disorder of the skin and subcutaneous tissue related to radiation, unspecified: Secondary | ICD-10-CM | POA: Diagnosis not present

## 2019-05-11 DIAGNOSIS — Z483 Aftercare following surgery for neoplasm: Secondary | ICD-10-CM | POA: Diagnosis not present

## 2019-05-11 DIAGNOSIS — I890 Lymphedema, not elsewhere classified: Secondary | ICD-10-CM | POA: Diagnosis not present

## 2019-05-11 DIAGNOSIS — M25612 Stiffness of left shoulder, not elsewhere classified: Secondary | ICD-10-CM | POA: Insufficient documentation

## 2019-05-11 NOTE — Therapy (Signed)
[place], [place], [address] Phone: [phone]   Fax:  [phone]  Physical Therapy Evaluation  Patient Details  Name: Anne Horton MRN: [id number] Date of Birth: 12-25-1975 Referring Provider (PT): Wilber Bihari    Encounter Date: 05/11/2019  PT End of Session - 05/11/19 1911    Visit Number  1    Number of Visits  9    Date for PT Re-Evaluation  06/11/19    PT Start Time  1430    PT Stop Time  1515    PT Time Calculation (min)  45 min    Behavior During Therapy  [facility] for tasks assessed/performed       Past Medical History:  Diagnosis Date  . Allergy   . Anemia   . Anxiety   . Breast cancer ([place]) 05/07/2018   left invasive ductal   . Cancer ([place])   . Chest pain   . Depression   . Family history of breast cancer   . Fibromyalgia    neurofibromatosis  . H/O: depression   . Hx of migraines   . Mitral regurgitation    mild per echo EF 55-60%  . Neurofibromatosis ([place])   . Palpitations   . Personal history of radiation therapy   . Tachycardia    Hx. of  . Wears contact lenses     Past Surgical History:  Procedure Laterality Date  . BREAST BIOPSY    . BREAST LUMPECTOMY Left 05/07/2018   Invasive ductal   . BREAST LUMPECTOMY WITH RADIOACTIVE SEED AND SENTINEL LYMPH NODE BIOPSY Left 05/07/2018   Procedure: BREAST LUMPECTOMY WITH RADIOACTIVE SEED AND SENTINEL LYMPH NODE BIOPSY;  Surgeon: Rolm Bookbinder, MD;  Location: [place];  Service: General;  Laterality: Left;  . CESAREAN SECTION    . CHOLECYSTECTOMY N/A 01/04/2015   Procedure: LAPAROSCOPIC CHOLECYSTECTOMY WITH INTRAOPERATIVE CHOLANGIOGRAM;  Surgeon: Erroll Luna, MD;  Location: [place];  Service: General;  Laterality: N/A;  . COLONOSCOPY    . ESOPHAGOGASTRODUODENOSCOPY    . WISDOM TOOTH EXTRACTION      There were no vitals filed for this visit.   Subjective Assessment - 05/11/19  1441    Subjective  Still having swelling in her left breast  and feels like lately it has been in left upper arm , She wears a compression bra and has an appt on Sept 14 to get new ones.    Pertinent History  05/07/18 Left lumpectomy: IDC grade 1, 1.2 cm, intermediate grade DCIS, margins negative,PASH, 0/7 sentinel lymph nodes negative, ER 95%, PR 95%, Ki-67 1%, HER-2 negative ratio 1.47, T1CN0 stage Ia, no chemo needed, radiation completed 08/10/18 with boost added, firomyalgia, depression    Patient Stated Goals  to get swelling under control.    Currently in Pain?  Yes    Pain Score  3     Pain Location  Breast    Pain Orientation  Left    Pain Descriptors / Indicators  Aching;Dull    Pain Type  Chronic pain    Pain Onset  More than a month ago    Pain Frequency  Intermittent    Aggravating Factors   can't say    Pain Relieving Factors  can't say    Effect of Pain on Daily Activities  none         [facility] PT Assessment - 05/11/19 0001      Assessment   Medical Diagnosis  Lt breast cancer    Referring Provider (PT)  Wilber Bihari     Onset Date/Surgical Date  08/05/18    Hand Dominance  Right    Next MD Visit  --   end of april   Prior Therapy  yes       Precautions   Precaution Comments  cancer, lymphedema Lt risk      Restrictions   Weight Bearing Restrictions  No      Balance Screen   Has the patient fallen in the past 6 months  Yes   went to give dog water and turned around and was on the floo   How many times?  once   pt does not feel that she needs to get treatement for fall    Has the patient had a decrease in activity level because of a fear of falling?   No    Is the patient reluctant to leave their home because of a fear of falling?   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 Skin is dry and darkened. Pt has several guitar string axillary cords in left axilla  mild fullness also in left lateral scapula and back     Skin Integrity  no open areas, incisions are well healed.     Other Surveys   Quick Dash    Quick DASH   6.82      Sensation   Light Touch  Appears Intact      Coordination   Gross Motor Movements are Fluid and Coordinated  Yes      Posture/Postural Control   Posture/Postural Control  Postural limitations    Postural Limitations  Rounded Shoulders;Forward head      AROM   Left Shoulder Flexion  175 Degrees   tightness in end range    Left Shoulder ABduction  160 Degrees   rightness in end range      PROM   Overall PROM Comments  limitation and tightness in end range of left shoulder ROM       Palpation   Palpation comment  full boggy left breast with pitting at medial border and inferior border. firm fullness  in left axilla with cording guitar string axillary cording palpable        LYMPHEDEMA/ONCOLOGY QUESTIONNAIRE - 05/11/19 1455      Right Upper Extremity Lymphedema   10 cm Proximal to Olecranon Process  26 cm    Olecranon Process  23.5 cm    10 cm Proximal to Ulnar Styloid Process  21.5 cm    Just Proximal to Ulnar Styloid Process  16 cm    Across Hand at [employer]  19 cm    At [place] of 2nd Digit  6.5 cm      Left Upper Extremity Lymphedema   10 cm Proximal to Olecranon Process  27 cm  Olecranon Process  23 cm    10 cm Proximal to Ulnar Styloid Process  21 cm    Just Proximal to Ulnar Styloid Process  15 cm    Across Hand at [employer]  18.5 cm    At [place] of 2nd Digit  6.1 cm           Quick Dash - 05/11/19 0001    Open a tight or new jar  No difficulty    Do heavy household chores (wash walls, wash floors)  No difficulty    Carry a shopping bag or briefcase  No difficulty    Wash your back  No difficulty    Use a knife to cut food  No difficulty    Recreational activities in which you take some force or impact through your arm, shoulder, or hand (golf, hammering, tennis)  No difficulty    During the past week, to what extent has your arm, shoulder or hand problem interfered with your normal social activities with family, friends, neighbors, or groups?  Not at all    During the past week, to what extent has your arm, shoulder or hand problem limited your work or other regular daily activities  Not at all    Arm, shoulder, or hand pain.  Mild    Tingling (pins and needles) in your arm, shoulder, or hand  Moderate    Difficulty Sleeping  No difficulty    DASH Score  6.82 %        Objective measurements completed on examination: See above findings.                   PT Long Term Goals - 05/11/19 1924      PT LONG TERM GOAL #1   Title  Pt will decrease breast size by at least 50% subjectively    Time  4    Period  Weeks    Status  New      PT LONG TERM GOAL #2   Title  Pt will report no breast pain    Time  4    Period  Weeks    Status  New      PT LONG TERM GOAL #3   Title  axillary cording in left axilla will not be palpable    Time  4    Period  Weeks    Status  New      PT LONG TERM GOAL #4   Title  Pt will have method to manage her lymphedema at home with flexitouch , self MLD, taping, and compression    Time  4    Period  Weeks    Status  New      PT LONG TERM GOAL #5   Title  Pt will have no feelings of tightness at 165 degrees of left shoulder abduction    Time  4    Period  Weeks    Status  New             Plan - 05/11/19 1912    Clinical Impression Statement  Pt returns with persistednt left breast  lymphostatc boggy lymphedema with pitting in medial portion of breast despite wearing compression bras, chip packs, kinesiotape, self MLD and exercise.  She also has evidence of recurrent axillary cording.  She would benefit from Flexitouch compression pump with extra programaming to trunk in addtion to different types of compression for breast.  She also would benefit from  stretching to left shoulder, manual work to [employer] of exercise program    Personal Factors and Comorbidities  Past/Current Experience;Comorbidity 2    Comorbidities  radiation history, persistent lyphedema despite conservative methods    Stability/Clinical Decision Making  Stable/Uncomplicated    Clinical Decision Making  Low    Rehab Potential  Excellent    PT Frequency  2x / week    PT Duration  4 weeks    PT Treatment/Interventions  Manual lymph drainage;Manual techniques;Taping;Patient/family education;Therapeutic exercise;ADLs/Self Care Home Management;Moist Heat;DME Instruction;Orthotic Fit/Training;Scar mobilization;Passive range of motion;Compression bandaging    PT Next Visit Plan  manual lymph draiange with extra time on back, trunk posterior interaxillary anastamosis, left axilla and breast. include left arm and do manual work to cording in left axilla.  try compression bandaging to left breast. follow up with flexitouch as needed    Consulted and Agree with Plan of Care  Patient       Patient will benefit from skilled therapeutic intervention in order to improve the following deficits and impairments:  Pain, Increased edema, Postural dysfunction, Decreased range of motion, Decreased scar mobility, Increased fascial restricitons  Visit Diagnosis: Lymphedema, not elsewhere classified - Plan: PT plan of care cert/re-cert  Disorder of the skin and subcutaneous tissue related to radiation, unspecified - Plan: PT plan of care cert/re-cert  Stiffness of left shoulder, not elsewhere classified -  Plan: PT plan of care cert/re-cert  Aftercare following surgery for neoplasm - Plan: PT plan of care cert/re-cert     Problem List Patient Active Problem List   Diagnosis Date Noted  . Genetic testing 04/16/2018  . Malignant neoplasm of upper-outer quadrant of left breast in female, estrogen receptor positive ([place]) 03/25/2018  . Family history of breast cancer 03/24/2018  . Anemia, iron deficiency 10/20/2014  . Chest pain   . Palpitations   . Neurofibromatosis ([place])   . Tachycardia   . Hx of migraines   . H/O: depression   . Mitral regurgitation   . BLOOD IN STOOL 01/12/2009  . BLOOD IN STOOL, OCCULT 01/12/2009  Donato Heinz. Owens Shark PT  Norwood Levo 05/11/2019, 7:28 PM  [place], [place], [address] Phone: [phone]   Fax:  [phone]  Name: Anne Horton MRN: [id number] Date of Birth: 01/01/1976

## 2019-05-12 ENCOUNTER — Other Ambulatory Visit: Payer: Self-pay

## 2019-05-12 ENCOUNTER — Ambulatory Visit: Payer: 59

## 2019-05-12 DIAGNOSIS — Z483 Aftercare following surgery for neoplasm: Secondary | ICD-10-CM

## 2019-05-12 DIAGNOSIS — M25612 Stiffness of left shoulder, not elsewhere classified: Secondary | ICD-10-CM | POA: Diagnosis not present

## 2019-05-12 DIAGNOSIS — I890 Lymphedema, not elsewhere classified: Secondary | ICD-10-CM

## 2019-05-12 DIAGNOSIS — L599 Disorder of the skin and subcutaneous tissue related to radiation, unspecified: Secondary | ICD-10-CM

## 2019-05-12 NOTE — Therapy (Signed)
[place], [place], [address] Phone: [phone]   Fax:  [phone]  Physical Therapy Treatment  Patient Details  Name: Anne Horton MRN: [id number] Date of Birth: 1976/05/23 Referring Provider (PT): Wilber Bihari    Encounter Date: 05/12/2019  PT End of Session - 05/12/19 1149    Visit Number  2    Number of Visits  9    Date for PT Re-Evaluation  06/11/19    PT Start Time  1104    PT Stop Time  1150    PT Time Calculation (min)  46 min    Activity Tolerance  Patient tolerated treatment well    Behavior During Therapy  [facility] for tasks assessed/performed       Past Medical History:  Diagnosis Date  . Allergy   . Anemia   . Anxiety   . Breast cancer ([place]) 05/07/2018   left invasive ductal   . Cancer ([place])   . Chest pain   . Depression   . Family history of breast cancer   . Fibromyalgia    neurofibromatosis  . H/O: depression   . Hx of migraines   . Mitral regurgitation    mild per echo EF 55-60%  . Neurofibromatosis ([place])   . Palpitations   . Personal history of radiation therapy   . Tachycardia    Hx. of  . Wears contact lenses     Past Surgical History:  Procedure Laterality Date  . BREAST BIOPSY    . BREAST LUMPECTOMY Left 05/07/2018   Invasive ductal   . BREAST LUMPECTOMY WITH RADIOACTIVE SEED AND SENTINEL LYMPH NODE BIOPSY Left 05/07/2018   Procedure: BREAST LUMPECTOMY WITH RADIOACTIVE SEED AND SENTINEL LYMPH NODE BIOPSY;  Surgeon: Rolm Bookbinder, MD;  Location: [place];  Service: General;  Laterality: Left;  . CESAREAN SECTION    . CHOLECYSTECTOMY N/A 01/04/2015   Procedure: LAPAROSCOPIC CHOLECYSTECTOMY WITH INTRAOPERATIVE CHOLANGIOGRAM;  Surgeon: Erroll Luna, MD;  Location: [place];  Service: General;  Laterality: N/A;  . COLONOSCOPY    . ESOPHAGOGASTRODUODENOSCOPY    . WISDOM TOOTH EXTRACTION      There were no vitals  filed for this visit.  Subjective Assessment - 05/12/19 1107    Subjective  Nothing new from evaluation yesterday.    Pertinent History  05/07/18 Left lumpectomy: IDC grade 1, 1.2 cm, intermediate grade DCIS, margins negative,PASH, 0/7 sentinel lymph nodes negative, ER 95%, PR 95%, Ki-67 1%, HER-2 negative ratio 1.47, T1CN0 stage Ia, no chemo needed, radiation completed 08/10/18 with boost added, firomyalgia, depression    Patient Stated Goals  to get swelling under control.    Currently in Pain?  No/denies                       [facility] Adult PT Treatment/Exercise - 05/12/19 0001      Manual Therapy   Manual Therapy  Myofascial release;Manual Lymphatic Drainage (MLD);Passive ROM    Myofascial Release  To Rt axilla during P/ROM    Manual Lymphatic Drainage (MLD)  In supine: short neck, superficial and deep abdominals, right axillary nodes, anterior interaxillary anastamosis, left inguinal nodes and left axillo-inguinal anastamosis, left breast, then to right sidelying for more work on left breast and lateral trunk towards posterior interaxillary anastamosis then finished retracing anastomosis in supine    Passive ROM  In supine to Lt shoulder into mostly abduction to strtch area of cording with some  flexion and D2                  PT Long Term Goals - 05/11/19 1924      PT LONG TERM GOAL #1   Title  Pt will decrease breast size by at least 50% subjectively    Time  4    Period  Weeks    Status  New      PT LONG TERM GOAL #2   Title  Pt will report no breast pain    Time  4    Period  Weeks    Status  New      PT LONG TERM GOAL #3   Title  axillary cording in left axilla will not be palpable    Time  4    Period  Weeks    Status  New      PT LONG TERM GOAL #4   Title  Pt will have method to manage her lymphedema at home with flexitouch , self MLD, taping, and compression    Time  4    Period  Weeks    Status  New      PT LONG TERM GOAL #5   Title   Pt will have no feelings of tightness at 165 degrees of left shoulder abduction    Time  4    Period  Weeks    Status  New            Plan - 05/12/19 1151    Clinical Impression Statement  Resumed manual lymph drainage today of Lt breast and incorporated Lt myofascial release and P/ROM of Lt shoulder working to decrease cording at axilla. Firmness palpable at superior breast at start of treatment was much softer by end and pt reports tightness in Lt UE was improved as well.    Personal Factors and Comorbidities  Past/Current Experience;Comorbidity 2    Comorbidities  radiation history, persistent lyphedema despite conservative methods    Stability/Clinical Decision Making  Stable/Uncomplicated    Rehab Potential  Excellent    PT Frequency  2x / week    PT Duration  4 weeks    PT Treatment/Interventions  Manual lymph drainage;Manual techniques;Taping;Patient/family education;Therapeutic exercise;ADLs/Self Care Home Management;Moist Heat;DME Instruction;Orthotic Fit/Training;Scar mobilization;Passive range of motion;Compression bandaging    PT Next Visit Plan  Cont manual lymph drainage with extra time on back, trunk posterior interaxillary anastamosis, left axilla and breast. include left arm and do manual work to cording in left axilla.  try compression bandaging to left breast. follow up with flexitouch as needed    Consulted and Agree with Plan of Care  Patient       Patient will benefit from skilled therapeutic intervention in order to improve the following deficits and impairments:  Pain, Increased edema, Postural dysfunction, Decreased range of motion, Decreased scar mobility, Increased fascial restricitons  Visit Diagnosis: Lymphedema, not elsewhere classified  Disorder of the skin and subcutaneous tissue related to radiation, unspecified  Stiffness of left shoulder, not elsewhere classified  Aftercare following surgery for neoplasm     Problem List Patient Active  Problem List   Diagnosis Date Noted  . Genetic testing 04/16/2018  . Malignant neoplasm of upper-outer quadrant of left breast in female, estrogen receptor positive ([place]) 03/25/2018  . Family history of breast cancer 03/24/2018  . Anemia, iron deficiency 10/20/2014  . Chest pain   . Palpitations   . Neurofibromatosis ([place])   . Tachycardia   . Hx  of migraines   . H/O: depression   . Mitral regurgitation   . BLOOD IN STOOL 01/12/2009  . BLOOD IN [place], OCCULT 01/12/2009    Otelia Limes, PTA 05/12/2019, 11:58 AM  [place], [place], [address] Phone: [phone]   Fax:  [phone]  Name: Anne Horton MRN: [id number] Date of Birth: 10/05/1975

## 2019-05-18 ENCOUNTER — Ambulatory Visit: Payer: 59 | Attending: Radiation Oncology

## 2019-05-18 ENCOUNTER — Other Ambulatory Visit: Payer: Self-pay

## 2019-05-18 DIAGNOSIS — M25612 Stiffness of left shoulder, not elsewhere classified: Secondary | ICD-10-CM | POA: Diagnosis not present

## 2019-05-18 DIAGNOSIS — Z483 Aftercare following surgery for neoplasm: Secondary | ICD-10-CM | POA: Diagnosis not present

## 2019-05-18 DIAGNOSIS — L599 Disorder of the skin and subcutaneous tissue related to radiation, unspecified: Secondary | ICD-10-CM | POA: Insufficient documentation

## 2019-05-18 DIAGNOSIS — I890 Lymphedema, not elsewhere classified: Secondary | ICD-10-CM | POA: Insufficient documentation

## 2019-05-18 DIAGNOSIS — M25512 Pain in left shoulder: Secondary | ICD-10-CM | POA: Insufficient documentation

## 2019-05-18 NOTE — Therapy (Signed)
[place], [place], [address] Phone: [phone]   Fax:  [phone]  Physical Therapy Treatment  Patient Details  Name: Anne Horton MRN: [id number] Date of Birth: Jul 08, 1976 Referring Provider (PT): Wilber Bihari    Encounter Date: 05/18/2019  PT End of Session - 05/18/19 0850    Visit Number  3    Number of Visits  9    Date for PT Re-Evaluation  06/11/19    PT Start Time  0804    PT Stop Time  0849    PT Time Calculation (min)  45 min    Activity Tolerance  Patient tolerated treatment well    Behavior During Therapy  [facility] for tasks assessed/performed       Past Medical History:  Diagnosis Date  . Allergy   . Anemia   . Anxiety   . Breast cancer ([place]) 05/07/2018   left invasive ductal   . Cancer ([place])   . Chest pain   . Depression   . Family history of breast cancer   . Fibromyalgia    neurofibromatosis  . H/O: depression   . Hx of migraines   . Mitral regurgitation    mild per echo EF 55-60%  . Neurofibromatosis ([place])   . Palpitations   . Personal history of radiation therapy   . Tachycardia    Hx. of  . Wears contact lenses     Past Surgical History:  Procedure Laterality Date  . BREAST BIOPSY    . BREAST LUMPECTOMY Left 05/07/2018   Invasive ductal   . BREAST LUMPECTOMY WITH RADIOACTIVE SEED AND SENTINEL LYMPH NODE BIOPSY Left 05/07/2018   Procedure: BREAST LUMPECTOMY WITH RADIOACTIVE SEED AND SENTINEL LYMPH NODE BIOPSY;  Surgeon: Rolm Bookbinder, MD;  Location: [place];  Service: General;  Laterality: Left;  . CESAREAN SECTION    . CHOLECYSTECTOMY N/A 01/04/2015   Procedure: LAPAROSCOPIC CHOLECYSTECTOMY WITH INTRAOPERATIVE CHOLANGIOGRAM;  Surgeon: Erroll Luna, MD;  Location: [place];  Service: General;  Laterality: N/A;  . COLONOSCOPY    . ESOPHAGOGASTRODUODENOSCOPY    . WISDOM TOOTH EXTRACTION      There were no vitals  filed for this visit.  Subjective Assessment - 05/18/19 0806    Subjective  My Lt breast felt good for a few days after I was here last. Today I'm having some pains at the top of my breast.    Pertinent History  05/07/18 Left lumpectomy: IDC grade 1, 1.2 cm, intermediate grade DCIS, margins negative,PASH, 0/7 sentinel lymph nodes negative, ER 95%, PR 95%, Ki-67 1%, HER-2 negative ratio 1.47, T1CN0 stage Ia, no chemo needed, radiation completed 08/10/18 with boost added, firomyalgia, depression    Patient Stated Goals  to get swelling under control.    Currently in Pain?  Yes    Pain Score  4     Pain Location  Breast    Pain Orientation  Left    Pain Descriptors / Indicators  Pins and needles    Pain Type  Chronic pain    Pain Onset  More than a month ago    Pain Frequency  Intermittent    Aggravating Factors   maybe when sweling is worse    Pain Relieving Factors  feels better after treatment                       [facility] Adult PT Treatment/Exercise - 05/18/19 0001  Manual Therapy   Myofascial Release  To Rt axilla during P/ROM    Manual Lymphatic Drainage (MLD)  In supine: short neck, superficial and deep abdominals, right axillary nodes, anterior interaxillary anastamosis, left inguinal nodes and left axillo-inguinal anastamosis, left breast, then to right sidelying for more work on left breast and lateral trunk towards posterior interaxillary anastamosis then finished retracing anastomosis in supine    Passive ROM  In supine to Lt shoulder into mostly abduction to strtch area of cording with some flexion and D2                  PT Long Term Goals - 05/11/19 1924      PT LONG TERM GOAL #1   Title  Pt will decrease breast size by at least 50% subjectively    Time  4    Period  Weeks    Status  New      PT LONG TERM GOAL #2   Title  Pt will report no breast pain    Time  4    Period  Weeks    Status  New      PT LONG TERM GOAL #3   Title   axillary cording in left axilla will not be palpable    Time  4    Period  Weeks    Status  New      PT LONG TERM GOAL #4   Title  Pt will have method to manage her lymphedema at home with flexitouch , self MLD, taping, and compression    Time  4    Period  Weeks    Status  New      PT LONG TERM GOAL #5   Title  Pt will have no feelings of tightness at 165 degrees of left shoulder abduction    Time  4    Period  Weeks    Status  New            Plan - 05/18/19 0850    Clinical Impression Statement  Continued manual lymph drainage to Lt breast along with myofascial release and end P/ROM stretching to Lt UE/axilla. Pt is tolerating this well and reported having some relief from fullness of breast for a few days after last session. She has heard from [facility] but demo has not been set up as of yet.    Personal Factors and Comorbidities  Past/Current Experience;Comorbidity 2    Comorbidities  radiation history, persistent lyphedema despite conservative methods    Stability/Clinical Decision Making  Stable/Uncomplicated    Rehab Potential  Excellent    PT Frequency  2x / week    PT Duration  4 weeks    PT Treatment/Interventions  Manual lymph drainage;Manual techniques;Taping;Patient/family education;Therapeutic exercise;ADLs/Self Care Home Management;Moist Heat;DME Instruction;Orthotic Fit/Training;Scar mobilization;Passive range of motion;Compression bandaging    PT Next Visit Plan  Try compression bandaging to Lt breast and instruct pt in this. Cont manual lymph drainage with extra time on back, trunk posterior interaxillary anastamosis, left axilla and breast. include left arm and do manual work to cording in left axilla; follow up with flexitouch as needed    PT Home Exercise Plan  Cont self MLD to Lt breast    Consulted and Agree with Plan of Care  Patient       Patient will benefit from skilled therapeutic intervention in order to improve the following deficits and  impairments:  Pain, Increased edema, Postural dysfunction, Decreased range of motion, Decreased  scar mobility, Increased fascial restricitons  Visit Diagnosis: Lymphedema, not elsewhere classified  Disorder of the skin and subcutaneous tissue related to radiation, unspecified  Stiffness of left shoulder, not elsewhere classified  Aftercare following surgery for neoplasm     Problem List Patient Active Problem List   Diagnosis Date Noted  . Genetic testing 04/16/2018  . Malignant neoplasm of upper-outer quadrant of left breast in female, estrogen receptor positive ([place]) 03/25/2018  . Family history of breast cancer 03/24/2018  . Anemia, iron deficiency 10/20/2014  . Chest pain   . Palpitations   . Neurofibromatosis ([place])   . Tachycardia   . Hx of migraines   . H/O: depression   . Mitral regurgitation   . BLOOD IN STOOL 01/12/2009  . BLOOD IN STOOL, OCCULT 01/12/2009    Otelia Limes, PTA 05/18/2019, 8:55 AM  [place], [place], [address] Phone: [phone]   Fax:  [phone]  Name: Anne Horton MRN: [id number] Date of Birth: [birth year]

## 2019-05-20 ENCOUNTER — Other Ambulatory Visit: Payer: Self-pay

## 2019-05-20 ENCOUNTER — Encounter: Payer: Self-pay | Admitting: Physical Therapy

## 2019-05-20 ENCOUNTER — Ambulatory Visit: Payer: 59 | Admitting: Physical Therapy

## 2019-05-20 DIAGNOSIS — L599 Disorder of the skin and subcutaneous tissue related to radiation, unspecified: Secondary | ICD-10-CM

## 2019-05-20 DIAGNOSIS — I890 Lymphedema, not elsewhere classified: Secondary | ICD-10-CM

## 2019-05-20 DIAGNOSIS — M25612 Stiffness of left shoulder, not elsewhere classified: Secondary | ICD-10-CM | POA: Diagnosis not present

## 2019-05-20 DIAGNOSIS — M25512 Pain in left shoulder: Secondary | ICD-10-CM

## 2019-05-20 DIAGNOSIS — Z483 Aftercare following surgery for neoplasm: Secondary | ICD-10-CM | POA: Diagnosis not present

## 2019-05-20 NOTE — Therapy (Signed)
[place], [place], [address] Phone: [phone]   Fax:  [phone]  Physical Therapy Treatment  Patient Details  Name: Anne Horton MRN: [id number] Date of Birth: 07-Jul-1976 Referring Provider (PT): Wilber Bihari    Encounter Date: 05/20/2019  PT End of Session - 05/20/19 6789    Visit Number  4    Number of Visits  9    Date for PT Re-Evaluation  06/11/19    PT Start Time  3810    PT Stop Time  1625    PT Time Calculation (min)  55 min    Activity Tolerance  Patient tolerated treatment well    Behavior During Therapy  [facility] for tasks assessed/performed       Past Medical History:  Diagnosis Date  . Allergy   . Anemia   . Anxiety   . Breast cancer ([place]) 05/07/2018   left invasive ductal   . Cancer ([place])   . Chest pain   . Depression   . Family history of breast cancer   . Fibromyalgia    neurofibromatosis  . H/O: depression   . Hx of migraines   . Mitral regurgitation    mild per echo EF 55-60%  . Neurofibromatosis ([place])   . Palpitations   . Personal history of radiation therapy   . Tachycardia    Hx. of  . Wears contact lenses     Past Surgical History:  Procedure Laterality Date  . BREAST BIOPSY    . BREAST LUMPECTOMY Left 05/07/2018   Invasive ductal   . BREAST LUMPECTOMY WITH RADIOACTIVE SEED AND SENTINEL LYMPH NODE BIOPSY Left 05/07/2018   Procedure: BREAST LUMPECTOMY WITH RADIOACTIVE SEED AND SENTINEL LYMPH NODE BIOPSY;  Surgeon: Rolm Bookbinder, MD;  Location: [place];  Service: General;  Laterality: Left;  . CESAREAN SECTION    . CHOLECYSTECTOMY N/A 01/04/2015   Procedure: LAPAROSCOPIC CHOLECYSTECTOMY WITH INTRAOPERATIVE CHOLANGIOGRAM;  Surgeon: Erroll Luna, MD;  Location: [place];  Service: General;  Laterality: N/A;  . COLONOSCOPY    . ESOPHAGOGASTRODUODENOSCOPY    . WISDOM TOOTH EXTRACTION      There were no vitals  filed for this visit.  Subjective Assessment - 05/20/19 1641    Subjective  Pt says she is having some pain in the front of her shoulder and sometimes shooting pain into the top center of her breast    Pertinent History  05/07/18 Left lumpectomy: IDC grade 1, 1.2 cm, intermediate grade DCIS, margins negative,PASH, 0/7 sentinel lymph nodes negative, ER 95%, PR 95%, Ki-67 1%, HER-2 negative ratio 1.47, T1CN0 stage Ia, no chemo needed, radiation completed 08/10/18 with boost added, firomyalgia, depression    Currently in Pain?  Yes    Pain Score  3     Pain Location  Shoulder    Pain Orientation  Left    Pain Descriptors / Indicators  Aching    Pain Onset  1 to 4 weeks ago                       [facility] Adult PT Treatment/Exercise - 05/20/19 0001      Manual Therapy   Manual Therapy  Manual Lymphatic Drainage (MLD);Compression Bandaging    Manual Lymphatic Drainage (MLD)  In supine: short neck, superficial and deep abdominals, right axillary nodes, anterior interaxillary anastamosis, left inguinal nodes and left axillo-inguinal anastamosis, left breast, then to right sidelying for more work  on left breast and lateral trunk towards posterior interaxillary anastamosis then finished retracing anastomosis in supine    Compression Bandaging  used the Coban 2 method for compression to left breast. with one layer of coban comfort and another layer of coban over breast to back to provide compression                   PT Long Term Goals - 05/11/19 1924      PT LONG TERM GOAL #1   Title  Pt will decrease breast size by at least 50% subjectively    Time  4    Period  Weeks    Status  New      PT LONG TERM GOAL #2   Title  Pt will report no breast pain    Time  4    Period  Weeks    Status  New      PT LONG TERM GOAL #3   Title  axillary cording in left axilla will not be palpable    Time  4    Period  Weeks    Status  New      PT LONG TERM GOAL #4   Title  Pt will  have method to manage her lymphedema at home with flexitouch , self MLD, taping, and compression    Time  4    Period  Weeks    Status  New      PT LONG TERM GOAL #5   Title  Pt will have no feelings of tightness at 165 degrees of left shoulder abduction    Time  4    Period  Weeks    Status  New            Plan - 05/20/19 1645    Clinical Impression Statement  left breast is palpably softer since beginning of treatment.  She tolerated compression bandaging well and will try to leave it on til tomorrow and will assess if she thinks that is has helped to reduce her lymphedema Pt does not have appt for more compression bras until Sept 14    Comorbidities  radiation history, persistent lyphedema despite conservative methods    Rehab Potential  Excellent    PT Frequency  2x / week    PT Duration  4 weeks    PT Next Visit Plan  assess effect of  compression bandaging to Lt breast and continue if beveficial . Cont manual lymph drainage with extra time on back, trunk posterior interaxillary anastamosis, left axilla and breast. include left arm and do manual work to cording in left axilla; follow up with flexitouch as needed       Patient will benefit from skilled therapeutic intervention in order to improve the following deficits and impairments:  Pain, Increased edema, Postural dysfunction, Decreased range of motion, Decreased scar mobility, Increased fascial restricitons  Visit Diagnosis: Disorder of the skin and subcutaneous tissue related to radiation, unspecified  Lymphedema, not elsewhere classified  Stiffness of left shoulder, not elsewhere classified  Acute pain of left shoulder     Problem List Patient Active Problem List   Diagnosis Date Noted  . Genetic testing 04/16/2018  . Malignant neoplasm of upper-outer quadrant of left breast in female, estrogen receptor positive ([place]) 03/25/2018  . Family history of breast cancer 03/24/2018  . Anemia, iron deficiency  10/20/2014  . Chest pain   . Palpitations   . Neurofibromatosis ([place])   . Tachycardia   .  Hx of migraines   . H/O: depression   . Mitral regurgitation   . BLOOD IN STOOL 01/12/2009  . BLOOD IN STOOL, OCCULT 01/12/2009   Donato Heinz. Owens Shark PT  Norwood Levo 05/20/2019, 4:49 PM  [place], [place], [address] Phone: [phone]   Fax:  [phone]  Name: Anne Horton MRN: [id number] Date of Birth: [birth year]

## 2019-05-25 ENCOUNTER — Encounter: Payer: 59 | Admitting: Physical Therapy

## 2019-05-26 IMAGING — MR MR BILATERAL BREAST WITHOUT AND WITH CONTRAST
8 of 13 series · 32 of 48 positions shown · IV contrast (13ml Multihance)
Comparison: Previous exam(s).

CLINICAL DATA: 42-year-old female with recently diagnosed left
breast grade 1-2 invasive ductal carcinoma and ductal carcinoma in
situ post ultrasound-guided biopsy of a 1.2 cm mass at the 2 o'clock
position.

The patient has a very strong family history of breast cancer
including in her mother diagnosed at age 34 as well as her
grandmother.
LABS:  Not applicable.
EXAM:
BILATERAL BREAST MRI WITH AND WITHOUT CONTRAST
TECHNIQUE: Multiplanar, multisequence MR images of both breasts were obtained
prior to and following the intravenous administration of 13 ml of
MultiHance.

[Series 2: t2_tirm_tra ipat (a-p) · axial · 3.0mm · 0.70mm/px · 1 of 57 slices shown]
[im 1/57]
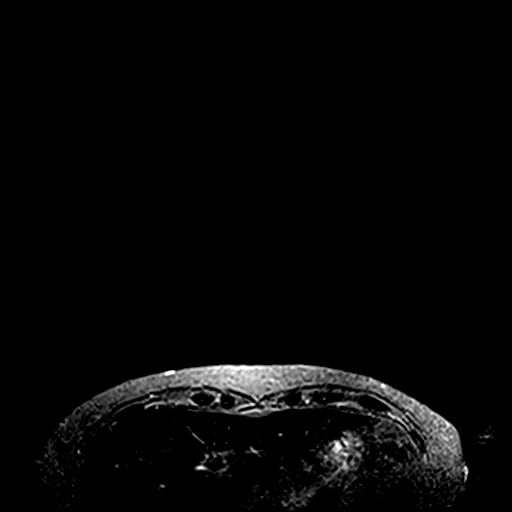

[Series 3: fl3d pre-cm no · axial · non-contrast · 1.2mm · 0.94mm/px · z∈[-73,+117]mm · 5 of 160 slices shown]
[im 1/160]
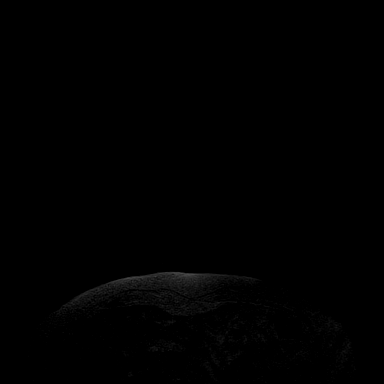
[im 40/160]
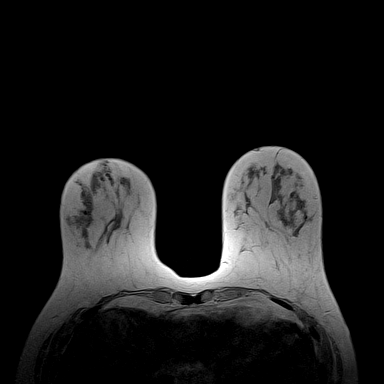
[im 80/160]
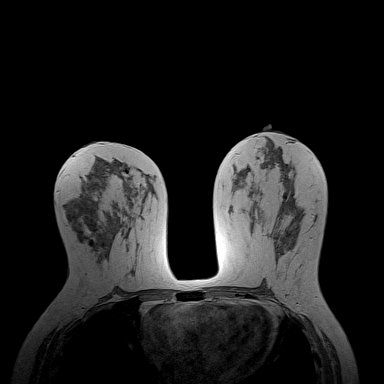
[im 120/160]
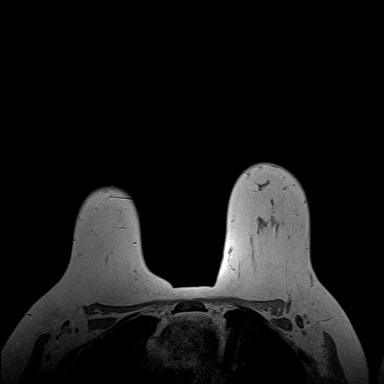
[im 160/160]
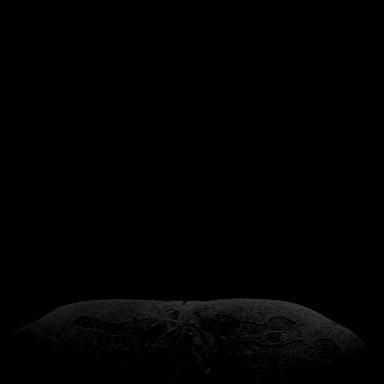

[Series 4: fl3d pre-cm · axial · non-contrast · 1.2mm · 0.94mm/px · z∈[-73,+117]mm · 5 of 160 slices shown]
[im 1/160]
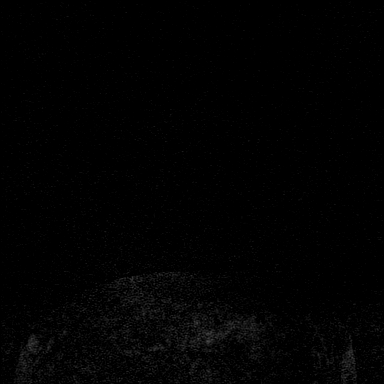
[im 40/160]
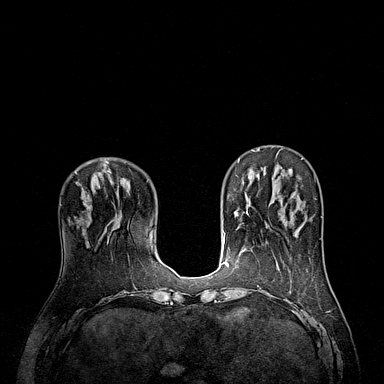
[im 80/160]
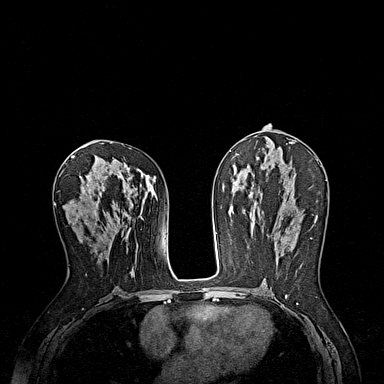
[im 120/160]
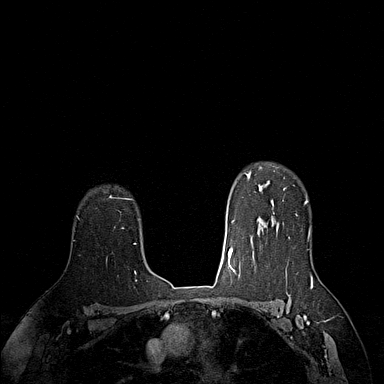
[im 160/160]
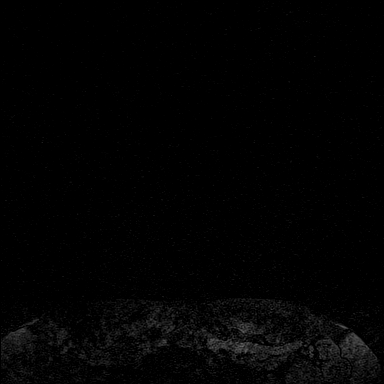

[Series 5: fl3d post-cm 20 · axial · 1.2mm · 0.94mm/px · z∈[-73,+117]mm · 5 of 160 slices shown (1 of 3)]
[im 1/160]
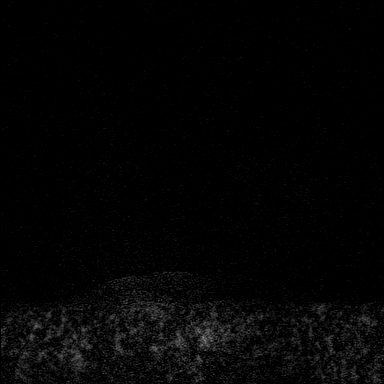
[im 40/160]
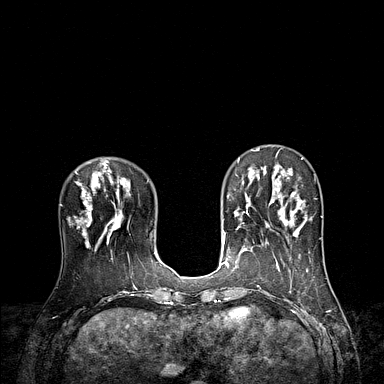
[im 80/160]
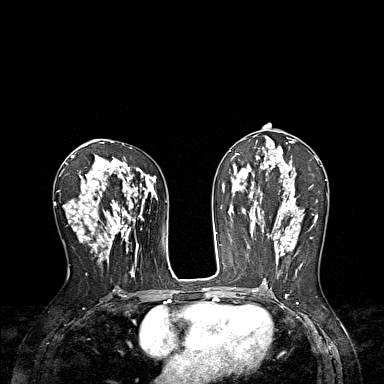
[im 120/160]
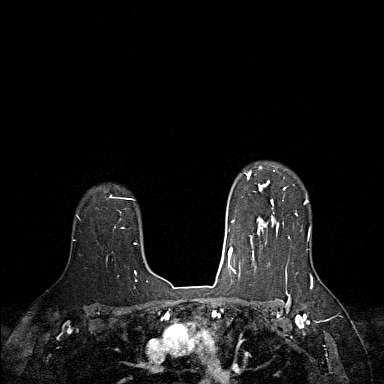
[im 160/160]
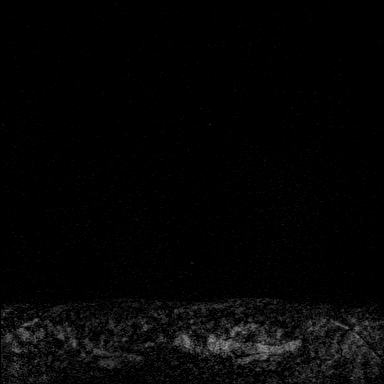

[Series 6: fl3d post-cm 20 · axial · 1.2mm · 0.94mm/px · z∈[-73,+117]mm · 5 of 160 slices shown (2 of 3)]
[im 1/160]
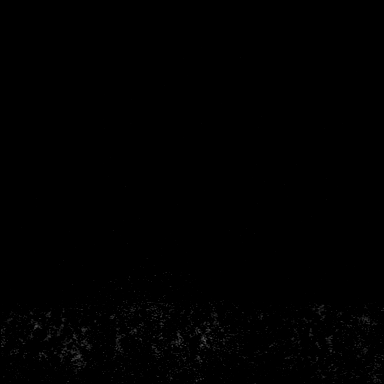
[im 40/160]
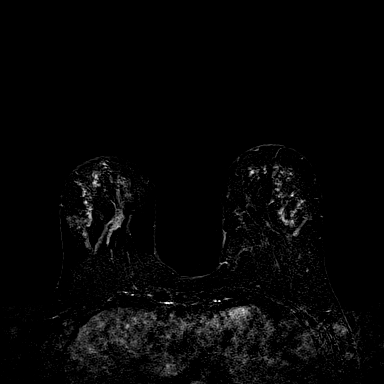
[im 80/160]
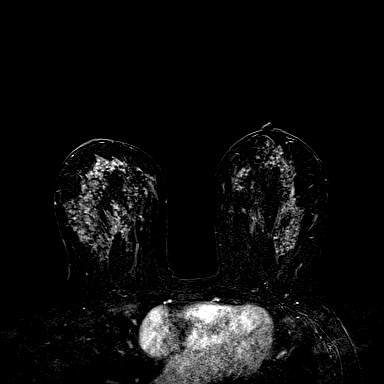
[im 120/160]
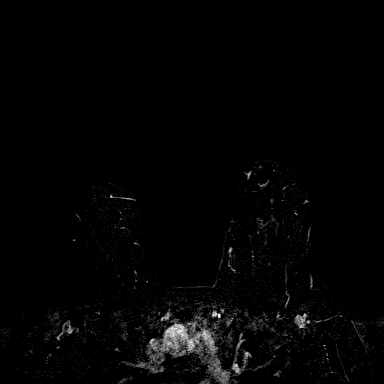
[im 160/160]
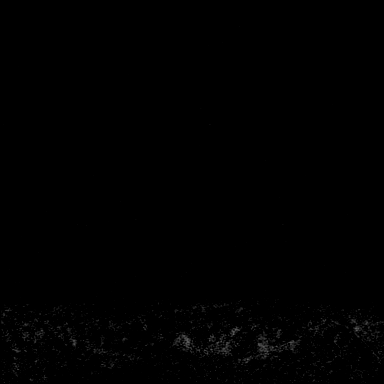

[Series 7: fl3d post-cm 20 · axial · 192.0mm · 0.94mm/px · 1 of 1 slices shown (3 of 3)]
[im 1/1]
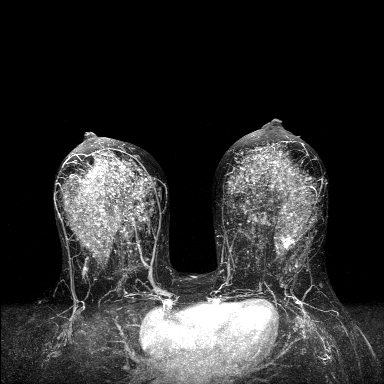

[Series 8: fl3d post-cm 3min · axial · 1.2mm · 0.94mm/px · z∈[-73,+117]mm · 5 of 160 slices shown]
[im 1/160]
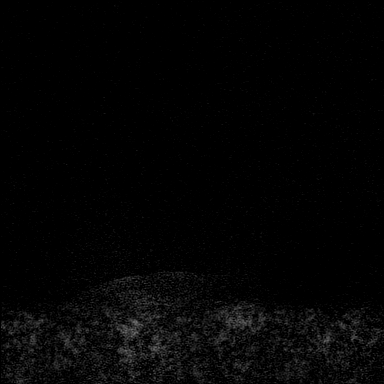
[im 40/160]
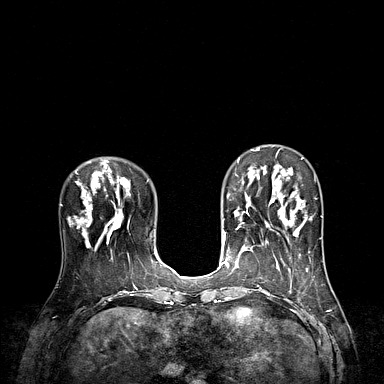
[im 80/160]
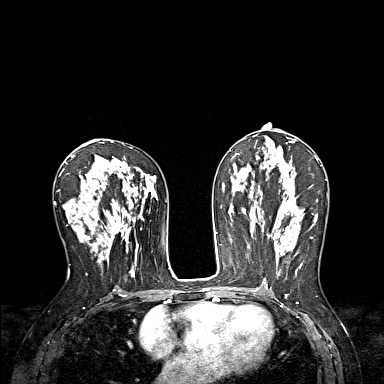
[im 120/160]
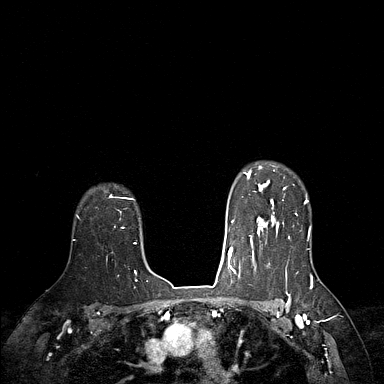
[im 160/160]
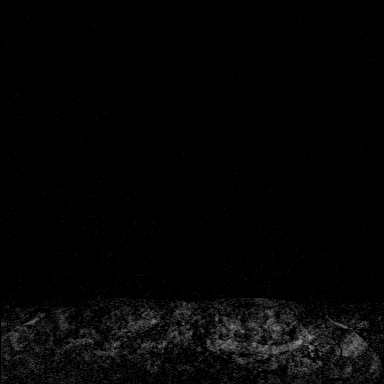

[Series 9: fl3d post-cm 3min_sub · axial · 1.2mm · 0.94mm/px · z∈[-73,+79]mm · 5 of 160 slices shown]
[im 1/160]
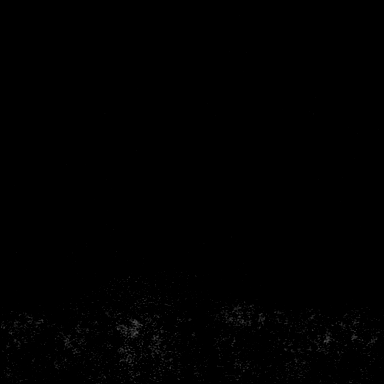
[im 32/160]
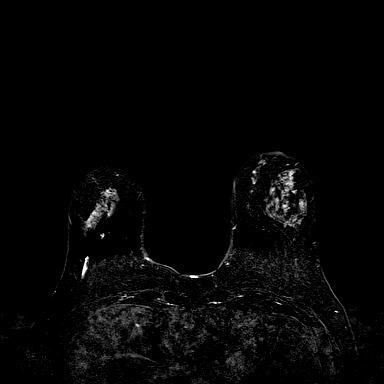
[im 64/160]
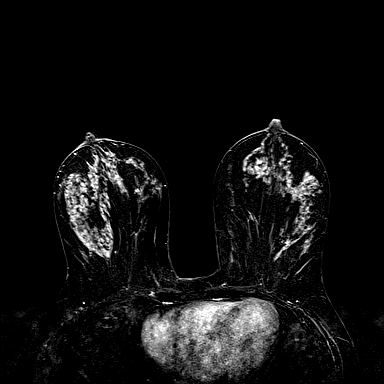
[im 96/160]
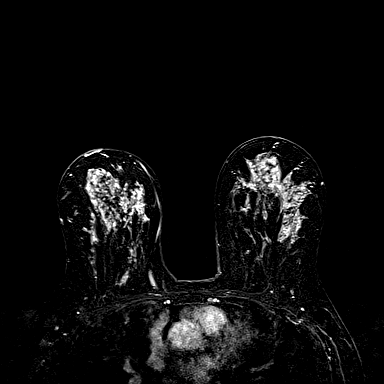
[im 128/160]
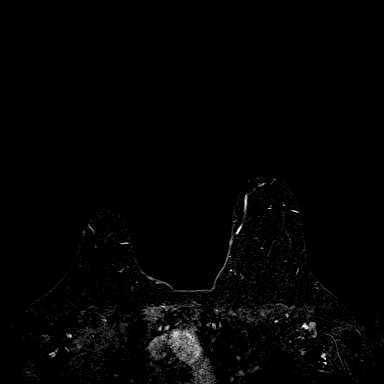

[32 of 48 positions shown; findings below may reference images not displayed]

THREE-DIMENSIONAL MR IMAGE RENDERING ON INDEPENDENT WORKSTATION:

Three-dimensional MR images were rendered by post-processing of the
original MR data on an independent workstation. The
three-dimensional MR images were interpreted, and findings are
reported in the following complete MRI report for this study. Three
dimensional images were evaluated at the independent [employer]
workstation
FINDINGS: Breast composition: c.  Heterogeneous fibroglandular tissue.

Background parenchymal enhancement: Marked.

Right breast: There is 2.3 cm linear oriented non mass enhancement
in the lower outer posterior right breast (subtraction image 128).

Left breast: Irregular enhancing mass in the upper-outer left breast
containing biopsy marking clip artifact compatible with biopsy
proven malignancy measures 1.1 cm AP 1.2 cm transverse and 1.3 cm
craniocaudal. No definite additional abnormal areas of enhancement
identified in the left breast to suggest additional sites of
disease.

Lymph nodes: No morphologically abnormal lymph nodes. Small less
than 3 mm left internal mammary lymph node subtraction image 67)
seen.

Ancillary findings:  None.
IMPRESSION: 1. Biopsy proven malignancy in the upper-outer posterior left breast
measures 1.1 x 1.2 x 1.3 cm.

2. Suspicious linear oriented non mass enhancement in the lower
outer quadrant of the right breast.

3. Marked background enhancement limits evaluation of the breast
parenchyma.

RECOMMENDATION:
Recommend MRI guided biopsy of the linear oriented non mass
enhancement in the lower outer posterior right breast.

BI-RADS CATEGORY  4: Suspicious.

## 2019-05-27 ENCOUNTER — Encounter: Payer: Self-pay | Admitting: Physical Therapy

## 2019-05-27 ENCOUNTER — Other Ambulatory Visit: Payer: Self-pay

## 2019-05-27 ENCOUNTER — Ambulatory Visit: Payer: 59 | Admitting: Physical Therapy

## 2019-05-27 DIAGNOSIS — M25512 Pain in left shoulder: Secondary | ICD-10-CM | POA: Diagnosis not present

## 2019-05-27 DIAGNOSIS — M25612 Stiffness of left shoulder, not elsewhere classified: Secondary | ICD-10-CM

## 2019-05-27 DIAGNOSIS — L599 Disorder of the skin and subcutaneous tissue related to radiation, unspecified: Secondary | ICD-10-CM

## 2019-05-27 DIAGNOSIS — I890 Lymphedema, not elsewhere classified: Secondary | ICD-10-CM

## 2019-05-27 DIAGNOSIS — Z483 Aftercare following surgery for neoplasm: Secondary | ICD-10-CM

## 2019-05-27 NOTE — Therapy (Signed)
[place], [place], [address] Phone: [phone]   Fax:  [phone]  Physical Therapy Treatment  Patient Details  Name: Anne Horton MRN: [id number] Date of Birth: 19-Feb-1976 Referring Provider (PT): Wilber Bihari    Encounter Date: 05/27/2019  PT End of Session - 05/27/19 1630    Visit Number  5    Number of Visits  9    Date for PT Re-Evaluation  06/11/19    PT Start Time  2297    PT Stop Time  1617    PT Time Calculation (min)  39 min    Activity Tolerance  Patient tolerated treatment well    Behavior During Therapy  [facility] for tasks assessed/performed       Past Medical History:  Diagnosis Date  . Allergy   . Anemia   . Anxiety   . Breast cancer ([place]) 05/07/2018   left invasive ductal   . Cancer ([place])   . Chest pain   . Depression   . Family history of breast cancer   . Fibromyalgia    neurofibromatosis  . H/O: depression   . Hx of migraines   . Mitral regurgitation    mild per echo EF 55-60%  . Neurofibromatosis ([place])   . Palpitations   . Personal history of radiation therapy   . Tachycardia    Hx. of  . Wears contact lenses     Past Surgical History:  Procedure Laterality Date  . BREAST BIOPSY    . BREAST LUMPECTOMY Left 05/07/2018   Invasive ductal   . BREAST LUMPECTOMY WITH RADIOACTIVE SEED AND SENTINEL LYMPH NODE BIOPSY Left 05/07/2018   Procedure: BREAST LUMPECTOMY WITH RADIOACTIVE SEED AND SENTINEL LYMPH NODE BIOPSY;  Surgeon: Rolm Bookbinder, MD;  Location: [place];  Service: General;  Laterality: Left;  . CESAREAN SECTION    . CHOLECYSTECTOMY N/A 01/04/2015   Procedure: LAPAROSCOPIC CHOLECYSTECTOMY WITH INTRAOPERATIVE CHOLANGIOGRAM;  Surgeon: Erroll Luna, MD;  Location: [place];  Service: General;  Laterality: N/A;  . COLONOSCOPY    . ESOPHAGOGASTRODUODENOSCOPY    . WISDOM TOOTH EXTRACTION      There were no vitals  filed for this visit.  Subjective Assessment - 05/27/19 1542    Subjective  Pt says she got some relief from the coban wrap and would like to continue.  she would like Flexitouch demonstration in the clinic. Going to get new compression bras next week.    Pertinent History  05/07/18 Left lumpectomy: IDC grade 1, 1.2 cm, intermediate grade DCIS, margins negative,PASH, 0/7 sentinel lymph nodes negative, ER 95%, PR 95%, Ki-67 1%, HER-2 negative ratio 1.47, T1CN0 stage Ia, no chemo needed, radiation completed 08/10/18 with boost added, firomyalgia, depression    Currently in Pain?  No/denies                       [facility] Adult PT Treatment/Exercise - 05/27/19 0001      Manual Therapy   Manual Therapy  Manual Lymphatic Drainage (MLD);Compression Bandaging    Edema Management  discussed Flexitouch demonstration process with pt.  she would like to have that done here     Manual Lymphatic Drainage (MLD)  In supine:diaphragmatic breaths. then  short neck, superficial and deep abdominals, right axillary nodes, anterior interaxillary anastamosis, left inguinal nodes and left axillo-inguinal anastamosis, left breast, then to right sidelying for more work on left breast and lateral trunk towards posterior interaxillary  anastamosis then finished retracing anastomosis in supine    Compression Bandaging  used the Coban 2 method for compression to left breast. with one layer of coban comfort and another layer of coban over breast to back to provide compression                   PT Long Term Goals - 05/27/19 1634      PT LONG TERM GOAL #1   Title  Pt will decrease breast size by at least 50% subjectively    Period  Weeks    Status  On-going      PT LONG TERM GOAL #2   Title  Pt will report no breast pain    Status  Achieved      PT LONG TERM GOAL #3   Title  axillary cording in left axilla will not be palpable    Time  4    Period  Weeks    Status  On-going      PT LONG TERM  GOAL #4   Title  Pt will have method to manage her lymphedema at home with flexitouch , self MLD, taping, and compression    Time  4    Period  Weeks    Status  On-going            Plan - 05/27/19 1632    Clinical Impression Statement  Pt is having good results with compression, especially more focused compression with the coban wrap.  she will get new compression bras on Monday and is in process for getting the Flexitouch so that she will be able to manage at home    Comorbidities  radiation history, persistent lyphedema despite conservative methods    PT Treatment/Interventions  Manual lymph drainage;Manual techniques;Taping;Patient/family education;Therapeutic exercise;ADLs/Self Care Home Management;Moist Heat;DME Instruction;Orthotic Fit/Training;Scar mobilization;Passive range of motion;Compression bandaging    PT Next Visit Plan  continue compression bandaging to Lt breast and continue if beveficial . Cont manual lymph drainage with extra time on back, trunk posterior interaxillary anastamosis, left axilla and breast. include left arm and do manual work to cording in left axilla; follow up with flexitouch as needed make sure pt can do self MLD       Patient will benefit from skilled therapeutic intervention in order to improve the following deficits and impairments:  Pain, Increased edema, Postural dysfunction, Decreased range of motion, Decreased scar mobility, Increased fascial restricitons  Visit Diagnosis: Disorder of the skin and subcutaneous tissue related to radiation, unspecified  Lymphedema, not elsewhere classified  Stiffness of left shoulder, not elsewhere classified  Acute pain of left shoulder  Aftercare following surgery for neoplasm     Problem List Patient Active Problem List   Diagnosis Date Noted  . Genetic testing 04/16/2018  . Malignant neoplasm of upper-outer quadrant of left breast in female, estrogen receptor positive ([place]) 03/25/2018  . Family  history of breast cancer 03/24/2018  . Anemia, iron deficiency 10/20/2014  . Chest pain   . Palpitations   . Neurofibromatosis ([place])   . Tachycardia   . Hx of migraines   . H/O: depression   . Mitral regurgitation   . BLOOD IN STOOL 01/12/2009  . BLOOD IN STOOL, OCCULT 01/12/2009   Donato Heinz. Owens Shark PT  Norwood Levo 05/27/2019, 4:37 PM  [place], [place], [address] Phone: [phone]   Fax:  [phone]  Name: Anne Horton MRN: [id number] Date of Birth:  [birth year]

## 2019-05-28 ENCOUNTER — Encounter

## 2019-05-31 ENCOUNTER — Ambulatory Visit: Payer: 59

## 2019-05-31 ENCOUNTER — Other Ambulatory Visit: Payer: Self-pay

## 2019-05-31 DIAGNOSIS — Z483 Aftercare following surgery for neoplasm: Secondary | ICD-10-CM

## 2019-05-31 DIAGNOSIS — M25612 Stiffness of left shoulder, not elsewhere classified: Secondary | ICD-10-CM

## 2019-05-31 DIAGNOSIS — I890 Lymphedema, not elsewhere classified: Secondary | ICD-10-CM | POA: Diagnosis not present

## 2019-05-31 DIAGNOSIS — L599 Disorder of the skin and subcutaneous tissue related to radiation, unspecified: Secondary | ICD-10-CM

## 2019-05-31 DIAGNOSIS — M25512 Pain in left shoulder: Secondary | ICD-10-CM | POA: Diagnosis not present

## 2019-05-31 NOTE — Therapy (Signed)
[place], [place], [address] Phone: [phone]   Fax:  [phone]  Physical Therapy Treatment  Patient Details  Name: Anne Horton MRN: [id number] Date of Birth: Apr 08, 1976 Referring Provider (PT): Wilber Bihari    Encounter Date: 05/31/2019  PT End of Session - 05/31/19 1625    Visit Number  6    Number of Visits  9    Date for PT Re-Evaluation  06/11/19    PT Start Time  8832    PT Stop Time  1623    PT Time Calculation (min)  48 min    Activity Tolerance  Patient tolerated treatment well    Behavior During Therapy  [facility] for tasks assessed/performed       Past Medical History:  Diagnosis Date  . Allergy   . Anemia   . Anxiety   . Breast cancer ([place]) 05/07/2018   left invasive ductal   . Cancer ([place])   . Chest pain   . Depression   . Family history of breast cancer   . Fibromyalgia    neurofibromatosis  . H/O: depression   . Hx of migraines   . Mitral regurgitation    mild per echo EF 55-60%  . Neurofibromatosis ([place])   . Palpitations   . Personal history of radiation therapy   . Tachycardia    Hx. of  . Wears contact lenses     Past Surgical History:  Procedure Laterality Date  . BREAST BIOPSY    . BREAST LUMPECTOMY Left 05/07/2018   Invasive ductal   . BREAST LUMPECTOMY WITH RADIOACTIVE SEED AND SENTINEL LYMPH NODE BIOPSY Left 05/07/2018   Procedure: BREAST LUMPECTOMY WITH RADIOACTIVE SEED AND SENTINEL LYMPH NODE BIOPSY;  Surgeon: Rolm Bookbinder, MD;  Location: [place];  Service: General;  Laterality: Left;  . CESAREAN SECTION    . CHOLECYSTECTOMY N/A 01/04/2015   Procedure: LAPAROSCOPIC CHOLECYSTECTOMY WITH INTRAOPERATIVE CHOLANGIOGRAM;  Surgeon: Erroll Luna, MD;  Location: [place];  Service: General;  Laterality: N/A;  . COLONOSCOPY    . ESOPHAGOGASTRODUODENOSCOPY    . WISDOM TOOTH EXTRACTION      There were no vitals  filed for this visit.  Subjective Assessment - 05/31/19 1540    Subjective  The coban is seeming to give me more relief, it has been helping. I get measured for my new compression bras tomorrow and have a demo with Flexitouch this week as well.    Pertinent History  05/07/18 Left lumpectomy: IDC grade 1, 1.2 cm, intermediate grade DCIS, margins negative,PASH, 0/7 sentinel lymph nodes negative, ER 95%, PR 95%, Ki-67 1%, HER-2 negative ratio 1.47, T1CN0 stage Ia, no chemo needed, radiation completed 08/10/18 with boost added, firomyalgia, depression    Patient Stated Goals  to get swelling under control.    Currently in Pain?  No/denies                       [facility] Adult PT Treatment/Exercise - 05/31/19 0001      Manual Therapy   Myofascial Release  To Lt axilla during P/ROM    Manual Lymphatic Drainage (MLD)  In supine: short neck, superficial and deep abdominals, right axillary nodes, anterior interaxillary anastamosis, left inguinal nodes and left axillo-inguinal anastamosis, left breast and axilla where pt c/o tightness and palpable fullness present, then to right sidelying for more work on left breast, axilla and lateral trunk towards posterior interaxillary and Lt  axillo-inguianl anastamosis then finished retracing anastomosis in supine    Passive ROM  In supine to Lt shoulder into mostly abduction to stretch area of cording with some flexion and D2                  PT Long Term Goals - 05/27/19 1634      PT LONG TERM GOAL #1   Title  Pt will decrease breast size by at least 50% subjectively    Period  Weeks    Status  On-going      PT LONG TERM GOAL #2   Title  Pt will report no breast pain    Status  Achieved      PT LONG TERM GOAL #3   Title  axillary cording in left axilla will not be palpable    Time  4    Period  Weeks    Status  On-going      PT LONG TERM GOAL #4   Title  Pt will have method to manage her lymphedema at home with flexitouch ,  self MLD, taping, and compression    Time  4    Period  Weeks    Status  On-going            Plan - 05/31/19 1625    Clinical Impression Statement  Pt reports good results with coban but did not continue with this today as pt gets measured for compression bra tomorrow and has demo with Flexitouch Thursday here at our clinic. Good soeftening noted of Lt breast by end of session as well as fullness in axilla that pt reported just noticing today.    Personal Factors and Comorbidities  Past/Current Experience;Comorbidity 2    Comorbidities  radiation history, persistent lyphedema despite conservative methods    Stability/Clinical Decision Making  Stable/Uncomplicated    Rehab Potential  Excellent    PT Frequency  2x / week    PT Duration  4 weeks    PT Treatment/Interventions  Manual lymph drainage;Manual techniques;Taping;Patient/family education;Therapeutic exercise;ADLs/Self Care Home Management;Moist Heat;DME Instruction;Orthotic Fit/Training;Scar mobilization;Passive range of motion;Compression bandaging    PT Next Visit Plan  continue compression bandaging to Lt breast. Cont manual lymph drainage with extra time on back, trunk posterior interaxillary anastamosis, left axilla and breast. include left arm and do manual work to cording in left axilla; make sure pt can do self MLD    PT Home Exercise Plan  Cont self MLD to Lt breast    Consulted and Agree with Plan of Care  Patient       Patient will benefit from skilled therapeutic intervention in order to improve the following deficits and impairments:  Pain, Increased edema, Postural dysfunction, Decreased range of motion, Decreased scar mobility, Increased fascial restricitons  Visit Diagnosis: Disorder of the skin and subcutaneous tissue related to radiation, unspecified  Lymphedema, not elsewhere classified  Stiffness of left shoulder, not elsewhere classified  Acute pain of left shoulder  Aftercare following surgery for  neoplasm     Problem List Patient Active Problem List   Diagnosis Date Noted  . Genetic testing 04/16/2018  . Malignant neoplasm of upper-outer quadrant of left breast in female, estrogen receptor positive ([place]) 03/25/2018  . Family history of breast cancer 03/24/2018  . Anemia, iron deficiency 10/20/2014  . Chest pain   . Palpitations   . Neurofibromatosis ([place])   . Tachycardia   . Hx of migraines   . H/O: depression   . Mitral  regurgitation   . BLOOD IN STOOL 01/12/2009  . BLOOD IN [place], OCCULT 01/12/2009    Otelia Limes, PTA 05/31/2019, 4:29 PM  [place], [place], [address] Phone: [phone]   Fax:  [phone]  Name: KOURTNI STINEMAN MRN: [id number] Date of Birth: 03/24/1976

## 2019-06-01 ENCOUNTER — Encounter: Payer: 59 | Admitting: Physical Therapy

## 2019-06-01 ENCOUNTER — Ambulatory Visit: Payer: 59

## 2019-06-01 DIAGNOSIS — C50912 Malignant neoplasm of unspecified site of left female breast: Secondary | ICD-10-CM | POA: Diagnosis not present

## 2019-06-03 ENCOUNTER — Other Ambulatory Visit: Payer: Self-pay

## 2019-06-03 ENCOUNTER — Encounter: Payer: Self-pay | Admitting: Physical Therapy

## 2019-06-03 ENCOUNTER — Ambulatory Visit: Payer: 59 | Admitting: Physical Therapy

## 2019-06-03 DIAGNOSIS — M25612 Stiffness of left shoulder, not elsewhere classified: Secondary | ICD-10-CM

## 2019-06-03 DIAGNOSIS — L599 Disorder of the skin and subcutaneous tissue related to radiation, unspecified: Secondary | ICD-10-CM

## 2019-06-03 DIAGNOSIS — M25512 Pain in left shoulder: Secondary | ICD-10-CM | POA: Diagnosis not present

## 2019-06-03 DIAGNOSIS — I890 Lymphedema, not elsewhere classified: Secondary | ICD-10-CM | POA: Diagnosis not present

## 2019-06-03 DIAGNOSIS — Z483 Aftercare following surgery for neoplasm: Secondary | ICD-10-CM | POA: Diagnosis not present

## 2019-06-03 NOTE — Therapy (Signed)
[place], [place], [address] Phone: [phone]   Fax:  [phone]  Physical Therapy Treatment  Patient Details  Name: Anne Horton MRN: [id number] Date of Birth: 1976/03/05 Referring Provider (PT): Wilber Bihari    Encounter Date: 06/03/2019  PT End of Session - 06/03/19 1632    Visit Number  7    Number of Visits  9    Date for PT Re-Evaluation  06/11/19    PT Start Time  3154    PT Stop Time  1615    PT Time Calculation (min)  45 min    Activity Tolerance  Patient tolerated treatment well    Behavior During Therapy  [facility] for tasks assessed/performed       Past Medical History:  Diagnosis Date  . Allergy   . Anemia   . Anxiety   . Breast cancer ([place]) 05/07/2018   left invasive ductal   . Cancer ([place])   . Chest pain   . Depression   . Family history of breast cancer   . Fibromyalgia    neurofibromatosis  . H/O: depression   . Hx of migraines   . Mitral regurgitation    mild per echo EF 55-60%  . Neurofibromatosis ([place])   . Palpitations   . Personal history of radiation therapy   . Tachycardia    Hx. of  . Wears contact lenses     Past Surgical History:  Procedure Laterality Date  . BREAST BIOPSY    . BREAST LUMPECTOMY Left 05/07/2018   Invasive ductal   . BREAST LUMPECTOMY WITH RADIOACTIVE SEED AND SENTINEL LYMPH NODE BIOPSY Left 05/07/2018   Procedure: BREAST LUMPECTOMY WITH RADIOACTIVE SEED AND SENTINEL LYMPH NODE BIOPSY;  Surgeon: Rolm Bookbinder, MD;  Location: [place];  Service: General;  Laterality: Left;  . CESAREAN SECTION    . CHOLECYSTECTOMY N/A 01/04/2015   Procedure: LAPAROSCOPIC CHOLECYSTECTOMY WITH INTRAOPERATIVE CHOLANGIOGRAM;  Surgeon: Erroll Luna, MD;  Location: [place];  Service: General;  Laterality: N/A;  . COLONOSCOPY    . ESOPHAGOGASTRODUODENOSCOPY    . WISDOM TOOTH EXTRACTION      There were no vitals  filed for this visit.  Subjective Assessment - 06/03/19 1540    Subjective  Pt got 2 more vida compression bras and she feels that they have better comression. She is wearing the hugger at night.  She for fitted for the Flexitouch today and it should be delivered next week.    Pertinent History  05/07/18 Left lumpectomy: IDC grade 1, 1.2 cm, intermediate grade DCIS, margins negative,PASH, 0/7 sentinel lymph nodes negative, ER 95%, PR 95%, Ki-67 1%, HER-2 negative ratio 1.47, T1CN0 stage Ia, no chemo needed, radiation completed 08/10/18 with boost added, firomyalgia, depression    Currently in Pain?  Yes    Pain Score  3     Pain Location  Breast    Pain Orientation  Left    Pain Descriptors / Indicators  Aching    Pain Type  Chronic pain    Pain Onset  More than a month ago                       [facility] Adult PT Treatment/Exercise - 06/03/19 0001      Manual Therapy   Manual Lymphatic Drainage (MLD)  In supine: short neck, superficial and deep abdominals, right axillary nodes, anterior interaxillary anastamosis, left inguinal nodes and left axillo-inguinal  anastamosis, left breast and axilla where pt c/o tightness and palpable fullness present, then to right sidelying for more work on left breast, axilla and lateral trunk towards posterior interaxillary and Lt axillo-inguianl anastamosis then finished retracing anastomosis in supine                  PT Long Term Goals - 05/27/19 1634      PT LONG TERM GOAL #1   Title  Pt will decrease breast size by at least 50% subjectively    Period  Weeks    Status  On-going      PT LONG TERM GOAL #2   Title  Pt will report no breast pain    Status  Achieved      PT LONG TERM GOAL #3   Title  axillary cording in left axilla will not be palpable    Time  4    Period  Weeks    Status  On-going      PT LONG TERM GOAL #4   Title  Pt will have method to manage her lymphedema at home with flexitouch , self MLD, taping, and  compression    Time  4    Period  Weeks    Status  On-going            Plan - 06/03/19 1633    Clinical Impression Statement  Pt is making good improvments.  She had gotten new compression bras which seem to be working well and can use her hugger with peach foam for more compression if she needs it. She likes the Flexitouch and will get it delivered next week.  She will be ready to discharge once that arrives    Comorbidities  radiation history, persistent lyphedema despite conservative methods    PT Treatment/Interventions  Manual lymph drainage;Manual techniques;Taping;Patient/family education;Therapeutic exercise;ADLs/Self Care Home Management;Moist Heat;DME Instruction;Orthotic Fit/Training;Scar mobilization;Passive range of motion;Compression bandaging    PT Next Visit Plan  Cont manual lymph drainage with extra time on back, trunk posterior interaxillary anastamosis, left axilla and breast. include left arm and do manual work to cording in left axilla; make sure pt can do self MLD    Consulted and Agree with Plan of Care  Patient       Patient will benefit from skilled therapeutic intervention in order to improve the following deficits and impairments:  Pain, Increased edema, Postural dysfunction, Decreased range of motion, Decreased scar mobility, Increased fascial restricitons  Visit Diagnosis: Disorder of the skin and subcutaneous tissue related to radiation, unspecified  Lymphedema, not elsewhere classified  Stiffness of left shoulder, not elsewhere classified     Problem List Patient Active Problem List   Diagnosis Date Noted  . Genetic testing 04/16/2018  . Malignant neoplasm of upper-outer quadrant of left breast in female, estrogen receptor positive ([place]) 03/25/2018  . Family history of breast cancer 03/24/2018  . Anemia, iron deficiency 10/20/2014  . Chest pain   . Palpitations   . Neurofibromatosis ([place])   . Tachycardia   . Hx of migraines   . H/O:  depression   . Mitral regurgitation   . BLOOD IN STOOL 01/12/2009  . BLOOD IN STOOL, OCCULT 01/12/2009   Donato Heinz. Owens Shark PT  Norwood Levo 06/03/2019, [place], [place], [address] Phone: [phone]   Fax:  [phone]  Name: Anne Horton MRN: [id number] Date of Birth: 18-Sep-1975

## 2019-06-04 ENCOUNTER — Encounter: Payer: Self-pay | Admitting: Physical Therapy

## 2019-06-04 MED FILL — TAMOXIFEN 20 MG TABLET: 20 | 90 days supply | Qty: 90 | Fill #3

## 2019-06-07 DIAGNOSIS — I890 Lymphedema, not elsewhere classified: Secondary | ICD-10-CM | POA: Diagnosis not present

## 2019-06-08 ENCOUNTER — Encounter: Payer: Self-pay | Admitting: Physical Therapy

## 2019-06-08 ENCOUNTER — Ambulatory Visit: Payer: 59 | Admitting: Physical Therapy

## 2019-06-08 ENCOUNTER — Other Ambulatory Visit: Payer: Self-pay

## 2019-06-08 DIAGNOSIS — I890 Lymphedema, not elsewhere classified: Secondary | ICD-10-CM | POA: Diagnosis not present

## 2019-06-08 DIAGNOSIS — M25512 Pain in left shoulder: Secondary | ICD-10-CM | POA: Diagnosis not present

## 2019-06-08 DIAGNOSIS — Z483 Aftercare following surgery for neoplasm: Secondary | ICD-10-CM | POA: Diagnosis not present

## 2019-06-08 DIAGNOSIS — M25612 Stiffness of left shoulder, not elsewhere classified: Secondary | ICD-10-CM | POA: Diagnosis not present

## 2019-06-08 DIAGNOSIS — L599 Disorder of the skin and subcutaneous tissue related to radiation, unspecified: Secondary | ICD-10-CM

## 2019-06-08 NOTE — Therapy (Signed)
[place], [place], [address] Phone: [phone]   Fax:  [phone]  Physical Therapy Treatment  Patient Details  Name: Anne Horton MRN: [id number] Date of Birth: [birth year] Referring Provider (PT): Wilber Bihari    Encounter Date: 06/08/2019  PT End of Session - 06/08/19 1618    Visit Number  8    Number of Visits  9    Date for PT Re-Evaluation  06/11/19    PT Start Time  4166    PT Stop Time  1610    PT Time Calculation (min)  40 min    Activity Tolerance  Patient tolerated treatment well    Behavior During Therapy  [facility] for tasks assessed/performed       Past Medical History:  Diagnosis Date  . Allergy   . Anemia   . Anxiety   . Breast cancer ([place]) 05/07/2018   left invasive ductal   . Cancer ([place])   . Chest pain   . Depression   . Family history of breast cancer   . Fibromyalgia    neurofibromatosis  . H/O: depression   . Hx of migraines   . Mitral regurgitation    mild per echo EF 55-60%  . Neurofibromatosis ([place])   . Palpitations   . Personal history of radiation therapy   . Tachycardia    Hx. of  . Wears contact lenses     Past Surgical History:  Procedure Laterality Date  . BREAST BIOPSY    . BREAST LUMPECTOMY Left 05/07/2018   Invasive ductal   . BREAST LUMPECTOMY WITH RADIOACTIVE SEED AND SENTINEL LYMPH NODE BIOPSY Left 05/07/2018   Procedure: BREAST LUMPECTOMY WITH RADIOACTIVE SEED AND SENTINEL LYMPH NODE BIOPSY;  Surgeon: Rolm Bookbinder, MD;  Location: [place];  Service: General;  Laterality: Left;  . CESAREAN SECTION    . CHOLECYSTECTOMY N/A 01/04/2015   Procedure: LAPAROSCOPIC CHOLECYSTECTOMY WITH INTRAOPERATIVE CHOLANGIOGRAM;  Surgeon: Erroll Luna, MD;  Location: [place];  Service: General;  Laterality: N/A;  . COLONOSCOPY    . ESOPHAGOGASTRODUODENOSCOPY    . WISDOM TOOTH EXTRACTION      There were no vitals  filed for this visit.  Subjective Assessment - 06/08/19 1615    Subjective  "My Flexitouch is coming on Thursday"  She states that the morning after last week's session ( she had Felxi demonstration and then had session of MLD) her breast "blew up"  but after she wore her compression bras during the day it went back down    Pertinent History  05/07/18 Left lumpectomy: IDC grade 1, 1.2 cm, intermediate grade DCIS, margins negative,PASH, 0/7 sentinel lymph nodes negative, ER 95%, PR 95%, Ki-67 1%, HER-2 negative ratio 1.47, T1CN0 stage Ia, no chemo needed, radiation completed 08/10/18 with boost added, firomyalgia, depression    Currently in Pain?  No/denies                       [facility] Adult PT Treatment/Exercise - 06/08/19 0001      Manual Therapy   Manual Lymphatic Drainage (MLD)  In supine: short neck, superficial and deep abdominals, right axillary nodes, anterior interaxillary anastamosis, left inguinal nodes and left axillo-inguinal anastamosis, left breast and axilla where pt c/o tightness and palpable fullness present, then to right sidelying for more work on left breast, axilla and lateral trunk towards posterior interaxillary and Lt axillo-inguianl anastamosis then finished retracing anastomosis in supine  PT Long Term Goals - 05/27/19 1634      PT LONG TERM GOAL #1   Title  Pt will decrease breast size by at least 50% subjectively    Period  Weeks    Status  On-going      PT LONG TERM GOAL #2   Title  Pt will report no breast pain    Status  Achieved      PT LONG TERM GOAL #3   Title  axillary cording in left axilla will not be palpable    Time  4    Period  Weeks    Status  On-going      PT LONG TERM GOAL #4   Title  Pt will have method to manage her lymphedema at home with flexitouch , self MLD, taping, and compression    Time  4    Period  Weeks    Status  On-going            Plan - 06/08/19 1618    Clinical  Impression Statement  Pt is doing well.  She has less congestion in left breast. Will check to see if she is doing ok with Flexitouch and compression bras next session and, if so, will discharge    Comorbidities  radiation history, persistent lyphedema despite conservative methods    PT Treatment/Interventions  Manual lymph drainage;Manual techniques;Taping;Patient/family education;Therapeutic exercise;ADLs/Self Care Home Management;Moist Heat;DME Instruction;Orthotic Fit/Training;Scar mobilization;Passive range of motion;Compression bandaging    PT Next Visit Plan  reassess for goals, MLD as needed consider discharge       Patient will benefit from skilled therapeutic intervention in order to improve the following deficits and impairments:  Pain, Increased edema, Postural dysfunction, Decreased range of motion, Decreased scar mobility, Increased fascial restricitons  Visit Diagnosis: Disorder of the skin and subcutaneous tissue related to radiation, unspecified  Lymphedema, not elsewhere classified     Problem List Patient Active Problem List   Diagnosis Date Noted  . Genetic testing 04/16/2018  . Malignant neoplasm of upper-outer quadrant of left breast in female, estrogen receptor positive ([place]) 03/25/2018  . Family history of breast cancer 03/24/2018  . Anemia, iron deficiency 10/20/2014  . Chest pain   . Palpitations   . Neurofibromatosis ([place])   . Tachycardia   . Hx of migraines   . H/O: depression   . Mitral regurgitation   . BLOOD IN STOOL 01/12/2009  . BLOOD IN STOOL, OCCULT 01/12/2009   Donato Heinz. Owens Shark PT  Norwood Levo 06/08/2019, 4:21 PM  [place], [place], [address] Phone: [phone]   Fax:  [phone]  Name: Anne Horton MRN: [id number] Date of Birth: Jul 01, 1976

## 2019-06-10 ENCOUNTER — Encounter: Payer: 59 | Admitting: Physical Therapy
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
## 2019-06-14 ENCOUNTER — Encounter: Payer: Self-pay | Admitting: Cardiovascular Disease

## 2019-06-14 ENCOUNTER — Ambulatory Visit: Payer: 59 | Admitting: Cardiovascular Disease

## 2019-06-14 ENCOUNTER — Other Ambulatory Visit: Payer: Self-pay

## 2019-06-14 VITALS — BP 118/84 | HR 90 | Ht 62.0 in | Wt 140.0 lb

## 2019-06-14 DIAGNOSIS — I10 Essential (primary) hypertension: Secondary | ICD-10-CM | POA: Diagnosis not present

## 2019-06-14 DIAGNOSIS — R000 Tachycardia, unspecified: Secondary | ICD-10-CM | POA: Diagnosis not present

## 2019-06-14 DIAGNOSIS — R002 Palpitations: Secondary | ICD-10-CM

## 2019-06-14 MED ORDER — METOPROLOL SUCCINATE ER 50 MG PO TB24: ORAL_TABLET | ORAL | 3 refills | Status: DC

## 2019-06-14 NOTE — Patient Instructions (Signed)

## 2019-06-15 ENCOUNTER — Encounter: Payer: Self-pay | Admitting: Physical Therapy

## 2019-06-15 ENCOUNTER — Ambulatory Visit: Payer: 59 | Admitting: Physical Therapy

## 2019-06-15 DIAGNOSIS — M25612 Stiffness of left shoulder, not elsewhere classified: Secondary | ICD-10-CM | POA: Diagnosis not present

## 2019-06-15 DIAGNOSIS — Z483 Aftercare following surgery for neoplasm: Secondary | ICD-10-CM | POA: Diagnosis not present

## 2019-06-15 DIAGNOSIS — L599 Disorder of the skin and subcutaneous tissue related to radiation, unspecified: Secondary | ICD-10-CM

## 2019-06-15 DIAGNOSIS — M25512 Pain in left shoulder: Secondary | ICD-10-CM | POA: Diagnosis not present

## 2019-06-15 DIAGNOSIS — I890 Lymphedema, not elsewhere classified: Secondary | ICD-10-CM | POA: Diagnosis not present

## 2019-06-15 NOTE — Therapy (Signed)
[place], [place], [address] Phone: [phone]   Fax:  [phone]  Physical Therapy Treatment  Patient Details  Name: Anne Horton MRN: [id number] Date of Birth: 1976/05/31 Referring Provider (PT): Wilber Bihari    Encounter Date: 06/15/2019  PT End of Session - 06/15/19 1628    Visit Number  9    Number of Visits  9    Date for PT Re-Evaluation  06/11/19    PT Start Time  1520    PT Stop Time  1612    PT Time Calculation (min)  52 min    Activity Tolerance  Patient tolerated treatment well    Behavior During Therapy  [facility] for tasks assessed/performed       Past Medical History:  Diagnosis Date  . Allergy   . Anemia   . Anxiety   . Breast cancer ([place]) 05/07/2018   left invasive ductal   . Cancer ([place])   . Chest pain   . Depression   . Family history of breast cancer   . Fibromyalgia    neurofibromatosis  . H/O: depression   . Hx of migraines   . Mitral regurgitation    mild per echo EF 55-60%  . Neurofibromatosis ([place])   . Palpitations   . Personal history of radiation therapy   . Tachycardia    Hx. of  . Wears contact lenses     Past Surgical History:  Procedure Laterality Date  . BREAST BIOPSY    . BREAST LUMPECTOMY Left 05/07/2018   Invasive ductal   . BREAST LUMPECTOMY WITH RADIOACTIVE SEED AND SENTINEL LYMPH NODE BIOPSY Left 05/07/2018   Procedure: BREAST LUMPECTOMY WITH RADIOACTIVE SEED AND SENTINEL LYMPH NODE BIOPSY;  Surgeon: Rolm Bookbinder, MD;  Location: [place];  Service: General;  Laterality: Left;  . CESAREAN SECTION    . CHOLECYSTECTOMY N/A 01/04/2015   Procedure: LAPAROSCOPIC CHOLECYSTECTOMY WITH INTRAOPERATIVE CHOLANGIOGRAM;  Surgeon: Erroll Luna, MD;  Location: [place];  Service: General;  Laterality: N/A;  . COLONOSCOPY    . ESOPHAGOGASTRODUODENOSCOPY    . WISDOM TOOTH EXTRACTION      There were no vitals  filed for this visit.  Subjective Assessment - 06/15/19 1541    Subjective  Pt is still having a little bit of pain in the top of her breast area she feels like it comes and goes. she is using her flexitouch and compression and finds that she needs to use the compression bras every day or she gets more fullness in her breast    Pertinent History  05/07/18 Left lumpectomy: IDC grade 1, 1.2 cm, intermediate grade DCIS, margins negative,PASH, 0/7 sentinel lymph nodes negative, ER 95%, PR 95%, Ki-67 1%, HER-2 negative ratio 1.47, T1CN0 stage Ia, no chemo needed, radiation completed 08/10/18 with boost added, firomyalgia, depression    Currently in Pain?  Yes    Pain Score  1     Pain Location  Breast         [facility] PT Assessment - 06/15/19 0001      AROM   Left Shoulder ABduction  180 Degrees   no pain                   [facility] Adult PT Treatment/Exercise - 06/15/19 0001      Manual Therapy   Manual Lymphatic Drainage (MLD)  In supine: short neck, superficial and deep abdominals, right axillary nodes, anterior interaxillary anastamosis,  left inguinal nodes and left axillo-inguinal anastamosis, left breast and axilla where pt c/o tightness and palpable fullness present, then to right sidelying for more work on left breast, axilla and lateral trunk towards posterior interaxillary and Lt axillo-inguianl anastamosis then finished retracing anastomosis in supine                  PT Long Term Goals - 06/15/19 1538      PT LONG TERM GOAL #1   Title  Pt will decrease breast size by at least 50% subjectively    Time  4    Status  Achieved      PT LONG TERM GOAL #2   Title  Pt will report no breast pain    Baseline  pt still has some minor breast pain in top of breast    Status  Partially Met      PT LONG TERM GOAL #3   Title  axillary cording in left axilla will not be palpable    Status  Achieved      PT LONG TERM GOAL #4   Title  Pt will have method to manage her  lymphedema at home with flexitouch , self MLD, taping, and compression    Status  Achieved      PT LONG TERM GOAL #5   Title  Pt will have no feelings of tightness at 165 degrees of left shoulder abduction    Time  4    Status  Achieved            Plan - 06/15/19 1629    Clinical Impression Statement  Anne Horton has done well with PT and is able to manage her breast lymphedema with Flexitouch and use of compression.  She still has some tenderness at top portion of breast and suggested she try a chip pack to this area under her Flexi.  She finds that if she is not wearing compression, the fullness in her breast increases and she knows what she needs to do to reduce it.  Hopeful that she will continue to see imrprovment as she continues her self management at home    Comorbidities  radiation history, persistent lyphedema despite conservative methods    PT Treatment/Interventions  Manual lymph drainage;Manual techniques;Taping;Patient/family education;Therapeutic exercise;ADLs/Self Care Home Management;Moist Heat;DME Instruction;Orthotic Fit/Training;Scar mobilization;Passive range of motion;Compression bandaging    PT Next Visit Plan  discharge this episode    Consulted and Agree with Plan of Care  Patient       Patient will benefit from skilled therapeutic intervention in order to improve the following deficits and impairments:  Pain, Increased edema, Postural dysfunction, Decreased range of motion, Decreased scar mobility, Increased fascial restricitons  Visit Diagnosis: Disorder of the skin and subcutaneous tissue related to radiation, unspecified  Lymphedema, not elsewhere classified  Stiffness of left shoulder, not elsewhere classified  Acute pain of left shoulder  Aftercare following surgery for neoplasm     Problem List Patient Active Problem List   Diagnosis Date Noted  . Genetic testing 04/16/2018  . Malignant neoplasm of upper-outer quadrant of left breast in female,  estrogen receptor positive ([employer]) 03/25/2018  . Family history of breast cancer 03/24/2018  . Anemia, iron deficiency 10/20/2014  . Chest pain   . Palpitations   . Neurofibromatosis ([place])   . Tachycardia   . Hx of migraines   . H/O: depression   . Mitral regurgitation   . BLOOD IN STOOL 01/12/2009  . BLOOD IN STOOL, OCCULT  01/12/2009  PHYSICAL THERAPY DISCHARGE SUMMARY  Visits from Start of Care: 9  Current functional level related to goals / functional outcomes: As above    Remaining deficits: As above    Education / Equipment: Self care for management of lymphedema  Plan: Patient agrees to discharge.  Patient goals were partially met. Patient is being discharged due to being pleased with the current functional level.  ?????   Maudry Diego, PT 06/15/19 4:33 PM    Anne Horton 06/15/2019, 4:32 PM  [place], [place], [address] Phone: [phone]   Fax:  [phone]  Name: KENESHA MOSHIER MRN: [id number] Date of Birth: Aug 15, 1976

## 2019-06-30 DIAGNOSIS — Z01419 Encounter for gynecological examination (general) (routine) without abnormal findings: Secondary | ICD-10-CM | POA: Diagnosis not present

## 2019-06-30 DIAGNOSIS — Z853 Personal history of malignant neoplasm of breast: Secondary | ICD-10-CM | POA: Diagnosis not present

## 2019-06-30 DIAGNOSIS — Z1151 Encounter for screening for human papillomavirus (HPV): Secondary | ICD-10-CM | POA: Diagnosis not present

## 2019-08-02 ENCOUNTER — Encounter: Payer: Self-pay | Admitting: Radiation Oncology

## 2019-08-02 ENCOUNTER — Other Ambulatory Visit: Payer: Self-pay

## 2019-08-02 ENCOUNTER — Ambulatory Visit
Admission: RE | Admit: 2019-08-02 | Discharge: 2019-08-02 | Disposition: A | Discharge status: Home / Self Care | Payer: 59 | Source: Ambulatory Visit | Attending: Radiation Oncology | Admitting: Radiation Oncology

## 2019-08-02 VITALS — BP 120/61 | HR 75 | Temp 97.8°F | Resp 22 | Ht 62.0 in | Wt 143.0 lb

## 2019-08-02 DIAGNOSIS — H5213 Myopia, bilateral: Secondary | ICD-10-CM | POA: Diagnosis not present

## 2019-08-02 DIAGNOSIS — Z170 Estrogen receptor positive status [ER+]: Secondary | ICD-10-CM | POA: Diagnosis not present

## 2019-08-02 DIAGNOSIS — Z79810 Long term (current) use of selective estrogen receptor modulators (SERMs): Secondary | ICD-10-CM | POA: Diagnosis not present

## 2019-08-02 DIAGNOSIS — Z79899 Other long term (current) drug therapy: Secondary | ICD-10-CM | POA: Insufficient documentation

## 2019-08-02 DIAGNOSIS — Z08 Encounter for follow-up examination after completed treatment for malignant neoplasm: Secondary | ICD-10-CM | POA: Diagnosis not present

## 2019-08-02 DIAGNOSIS — I890 Lymphedema, not elsewhere classified: Secondary | ICD-10-CM | POA: Insufficient documentation

## 2019-08-02 DIAGNOSIS — Z923 Personal history of irradiation: Secondary | ICD-10-CM | POA: Diagnosis not present

## 2019-08-02 DIAGNOSIS — C50412 Malignant neoplasm of upper-outer quadrant of left female breast: Secondary | ICD-10-CM | POA: Insufficient documentation

## 2019-08-02 NOTE — Patient Instructions (Signed)
Coronavirus (COVID-19) Are you at risk?  Are you at risk for the Coronavirus (COVID-19)?  To be considered HIGH RISK for Coronavirus (COVID-19), you have to meet the following criteria:  . Traveled to [place], [place], [place], [place] or [place]; or in the [place] to [place], [place], [place], or [place]; and have fever, cough, and shortness of breath within the last 2 weeks of travel OR . Been in close contact with a person diagnosed with COVID-19 within the last 2 weeks and have fever, cough, and shortness of breath . IF YOU DO NOT MEET THESE CRITERIA, YOU ARE CONSIDERED LOW RISK FOR COVID-19.  What to do if you are HIGH RISK for COVID-19?  . If you are having a medical emergency, call 911. . Seek medical care right away. Before you go to a doctor's office, urgent care or emergency department, call ahead and tell them about your recent travel, contact with someone diagnosed with COVID-19, and your symptoms. You should receive instructions from your physician's office regarding next steps of care.  . When you arrive at healthcare provider, tell the healthcare staff immediately you have returned from visiting [place], [place], [place], [place] or [place]; or traveled in the [place] to [place], [place], [place], or [place]; in the last two weeks or you have been in close contact with a person diagnosed with COVID-19 in the last 2 weeks.   . Tell the health care staff about your symptoms: fever, cough and shortness of breath. . After you have been seen by a medical provider, you will be either: o Tested for (COVID-19) and discharged home on quarantine except to seek medical care if symptoms worsen, and asked to  - Stay home and avoid contact with others until you get your results (4-5 days)  - Avoid travel on public transportation if possible (such as bus, train, or airplane) or o Sent to the Emergency Department by EMS for evaluation, COVID-19 testing, and possible  admission depending on your condition and test results.  What to do if you are LOW RISK for COVID-19?  Reduce your risk of any infection by using the same precautions used for avoiding the common cold or flu:  . Wash your hands often with soap and warm water for at least 20 seconds.  If soap and water are not readily available, use an alcohol-based hand sanitizer with at least 60% alcohol.  . If coughing or sneezing, cover your mouth and nose by coughing or sneezing into the elbow areas of your shirt or coat, into a tissue or into your sleeve (not your hands). . Avoid shaking hands with others and consider head nods or verbal greetings only. . Avoid touching your eyes, nose, or mouth with unwashed hands.  . Avoid close contact with people who are sick. . Avoid places or events with large numbers of people in one location, like concerts or sporting events. . Carefully consider travel plans you have or are making. . If you are planning any travel outside or inside the [place], visit the CDC's Travelers' Health webpage for the latest health notices. . If you have some symptoms but not all symptoms, continue to monitor at home and seek medical attention if your symptoms worsen. . If you are having a medical emergency, call 911.   ADDITIONAL HEALTHCARE OPTIONS FOR PATIENTS  [facility] / e-Visit: [email or web address]         [facility]: [phone]  [facility]: [phone]                   [facility]: [phone]   

## 2019-08-02 NOTE — Progress Notes (Signed)
Pt reports today for f/u with Dr. Sondra Come. Pt reports fatigue is resolved. Pt reports pain in breast, described as "dull, achy". Pain is constant. Pt does not take any medications, including OTC, for breast pain. Pt is not using moisturizer on breast. Breast is swollen, skin at baseline. Pt reports using lymphedema machine, Flexitouch, daily which helps swelling and "hardness" but does not fully resolve swelling.   BP 120/61 (BP Location: Right Arm, Patient Position: Sitting)   Pulse 75   Temp 97.8 F (36.6 C) (Temporal)   Resp (!) 22   Ht 5\' 2"  (1.575 m)   Wt 143 lb (64.9 kg)   SpO2 100%   BMI 26.16 kg/m   Wt Readings from Last 3 Encounters:  08/02/19 143 lb (64.9 kg)  05/07/18 144 lb (65.3 kg)  03/13/18 144 lb (65.3 kg)   Loma Sousa, RN BSN

## 2019-08-02 NOTE — Progress Notes (Signed)
Radiation Oncology         [phone] ________________________________  Name: Anne Horton [facility] MRN: [id number]  Date: 08/02/2019  DOB: [birth year]  Follow-Up Visit Note  CC: [name], MD    Diagnosis: Stage I invasive ductal carcinoma of the left breast (pT1c, pN0)  Interval Since Last Radiation:  One year  Radiation treatment dates:  06/24/2018 to 08/10/2018  Site/dose:    1. The Left breast was treated to 50.4 Gy in 28 fractions of 1.8 Gy. 2. The Left breast was boosted to 10 Gy in 5 fractions of 2 Gy.  Narrative:  The patient returns today for routine follow-up. Since her last follow-up, she was seen at outpatient rehab for left breast lymphedema. She has been able to manage it with Flexitouch and use of compression.  She continues have pain in the upper aspect of the breast does not require any medication for this level of pain.  sHe denies any nipple discharge or bleeding.  Often times she will notice more edema in the breast upon awakening rather than later in the afternoon.  She continues to wear a compression bra in the evening and at work.  Uses the Flexitouch  1 hour/day             On review of systems, she reports tolerating tamoxifen well.  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  Meds: Current Outpatient Medications  Medication Sig Dispense Refill  . acetaminophen (TYLENOL) 500 MG tablet Take 500 mg by mouth every 6 (six) hours as needed.    . ALPRAZolam (XANAX) 0.5 MG tablet Take 0.5 mg by mouth at bedtime as needed (FOR ANXIETY). :take 1/2 to 1 twice a day as needed    . cetirizine (ZYRTEC) 10 MG tablet Take 10 mg by mouth as needed for allergies.     Marland Kitchen ibuprofen (ADVIL,MOTRIN) 200 MG tablet Take 800 mg by mouth daily as needed for headache.    . metoprolol succinate (TOPROL-XL) 50 MG 24 hr tablet TAKE 1 TABLET BY MOUTH DAILY. TAKE WITH OR IMMEDIATELY FOLLOWING A  MEAL. 90 tablet 3  . propranolol (INDERAL) 10 MG tablet TAKE 1 TABLET BY MOUTH FOUR TIMES DAILY AS NEEDED FOR PALPITATIONS 120 tablet 3  . tamoxifen (NOLVADEX) 20 MG tablet Take 1 tablet (20 mg total) by mouth daily. 90 tablet 3  . venlafaxine XR (EFFEXOR-XR) 150 MG 24 hr capsule Take 150 mg by mouth daily with breakfast.     No current facility-administered medications for this encounter.     Physical Findings: The patient is in no acute distress. Patient is alert and oriented.  vitals were not taken for this visit.   Lungs are clear to auscultation bilaterally. Heart has regular rate and rhythm. No palpable cervical, supraclavicular, or axillary adenopathy. Abdomen soft, non-tender, normal bowel sounds.  Breast Exam Right Breast: Large and pendulous without mass, nipple discharge or bleeding. Left Breast: Large and pendulous without mass nipple discharge or bleeding.  The left breast continues to be larger than the right but has decreased in size with physical therapy maneuvers as above.  The breast is much softer than on my previous exam.  There is less hyper pigmentation changes/erythema in addition  Lab Findings: Lab Results  Component Value Date   WBC 7.3 08/03/2018   HGB 13.2 08/03/2018   HCT 42.1 08/03/2018   MCV 84.0 08/03/2018   PLT 404 (  H) 08/03/2018    Radiographic Findings: No results found.  Impression:  Stage I invasive ductal carcinoma of the left breast (pT1c, pN0).  No evidence of recurrence on clinical exam today.  Breast lymphedema is improved but still present to a degree.  This breast edema continues to bother her.  Plan:  The patent will continue on Tamoxifen. Patient will return for follow-up in 6 months. Follow-up with Dr. Lindi Adie, medical oncologist, in February of 2021, mammogram due in June of 2021, and follow-up with surgery in August of 2021.   ____________________________________  Blair Promise, PhD, MD  This document serves as a record of  services personally performed by [name], MD. It was created on his behalf by Clerance Lav, a trained medical scribe. The creation of this record is based on the scribe's personal observations and the provider's statements to them. This document has been checked and approved by the attending provider.

## 2019-08-09 MED FILL — METOPROLOL SUCCINATE ER 50: 50 | 90 days supply | Qty: 90 | Fill #0

## 2019-08-11 MED FILL — VENLAFAXINE HCL ER 75 MG CA: 75 | 30 days supply | Qty: 60 | Fill #0

## 2019-08-25 DIAGNOSIS — F321 Major depressive disorder, single episode, moderate: Secondary | ICD-10-CM | POA: Diagnosis not present

## 2019-08-25 MED FILL — ALPRAZolam 0.5 MG TABS: 0.5 | 90 days supply | Qty: 180 | Fill #0

## 2019-09-03 ENCOUNTER — Other Ambulatory Visit: Payer: Self-pay

## 2019-09-03 MED ORDER — TAMOXIFEN CITRATE 20 MG PO TABS: 20.0000 mg | ORAL_TABLET | Freq: Every day | ORAL | 3 refills | Status: DC

## 2019-09-03 MED FILL — TAMOXIFEN 20 MG TABLET: 20 | 90 days supply | Qty: 90 | Fill #0

## 2019-09-08 MED FILL — VENLAFAXINE HCL ER 75 MG CA: 75 | 90 days supply | Qty: 180 | Fill #0

## 2019-11-01 MED FILL — METOPROLOL SUCCINATE ER 50: 50 | 90 days supply | Qty: 90 | Fill #1

## 2019-11-02 NOTE — Progress Notes (Signed)
Patient Care Team: Daphene Calamity as PCP - General (Family Medicine) [name], Wonda Cheng, MD as PCP - Cardiology (Cardiology) [name], MD as Consulting Physician (Radiation Oncology) Nicholas Lose, MD as Consulting Physician (Hematology and Oncology) Rolm Bookbinder, MD as Consulting Physician (General Surgery)  DIAGNOSIS:    ICD-10-CM   1. Malignant neoplasm of upper-outer quadrant of left breast in female, estrogen receptor positive ([place])  C50.412    Z17.0     SUMMARY OF ONCOLOGIC HISTORY: Oncology History  Malignant neoplasm of upper-outer quadrant of left breast in female, estrogen receptor positive ([place])  03/20/2018 Initial Diagnosis   Screening detected left breast spiculated mass by ultrasound measured 1.2 cm.  Biopsy revealed invasive ductal carcinoma grade 1-2 with high-grade DCIS, ER 95%, PR 95%, Ki-67 1%, HER-2 negative ratio 1.47, T1CN0 stage I a clinical stage   03/20/2018 Cancer Staging   Staging form: Breast, AJCC 8th Edition - Clinical stage from 03/20/2018: Stage IA (cT1c, cN0, cM0, G2, ER+, PR+, HER2-) - Signed by Gardenia Phlegm, NP on 05/27/2018   04/08/2018 Genetic Testing   VUS in NF1, otherwise negative.  Genes tested: APC, ATM, AXIN2, BARD1, BMPR1A, BRCA1, BRCA2, BRIP1, CDH1, CDKN2A (p14ARF), CDKN2A (p16INK4a), CKD4, CHEK2, CTNNA1, DICER1, EPCAM (Deletion/duplication testing only), GREM1 (promoter region deletion/duplication testing only), KIT, MEN1, MLH1, MSH2, MSH3, MSH6, MUTYH, NBN, NF1, NHTL1, PALB2, PDGFRA, PMS2, POLD1, POLE, PTEN, RAD50, RAD51C, RAD51D, SDHB, SDHC, SDHD, SMAD4, SMARCA4. STK11, TP53, TSC1, TSC2, and VHL.  The following genes were evaluated for sequence changes only: SDHA and HOXB13 c.251G>A variant only.   05/07/2018 Surgery   Left lumpectomy: IDC grade 1, 1.2 cm, intermediate grade DCIS, margins negative,PASH, 0/7 sentinel lymph nodes negative, ER 95%, PR 95%, Ki-67 1%, HER-2 negative ratio 1.47, T1CN0 stage Ia     05/07/2018 Cancer Staging   Staging form: Breast, AJCC 8th Edition - Pathologic stage from 05/07/2018: Stage IA (pT1c, pN0, cM0, G1, ER+, PR+, HER2-, Oncotype DX score: 11) - Signed by Gardenia Phlegm, NP on 05/27/2018   05/25/2018 Oncotype testing   Oncotype DX score 11: 3% risk of distant recurrence of 9 years, low risk   06/24/2018 - 08/03/2018 Radiation Therapy   Adjuvant radiation therapy   09/2018 -  Anti-estrogen oral therapy   Tamoxifen daily     CHIEF COMPLIANT: Follow-up of left breast cancer on tamoxifen   INTERVAL HISTORY: Anne Horton is a 44 y.o. with above-mentioned history of left breast cancer treated with lumpectomy, radiation, and who is currently on antiestrogen therapy with anastrozole. Mammogram on 03/15/19 showed no evidence of malignancy bilaterally. She presents to the clinic today for annual follow-up.  She is complaining of diffuse pain in the left breast for the past 2 weeks.    ALLERGIES:  is allergic to latex.  MEDICATIONS:  Current Outpatient Medications  Medication Sig Dispense Refill  . acetaminophen (TYLENOL) 500 MG tablet Take 500 mg by mouth every 6 (six) hours as needed.    . ALPRAZolam (XANAX) 0.5 MG tablet Take 0.5 mg by mouth at bedtime as needed (FOR ANXIETY). :take 1/2 to 1 twice a day as needed    . cetirizine (ZYRTEC) 10 MG tablet Take 10 mg by mouth as needed for allergies.     Marland Kitchen ibuprofen (ADVIL,MOTRIN) 200 MG tablet Take 800 mg by mouth daily as needed for headache.    . metoprolol succinate (TOPROL-XL) 50 MG 24 hr tablet TAKE 1 TABLET BY MOUTH DAILY. TAKE WITH OR IMMEDIATELY FOLLOWING A  MEAL. 90 tablet 3  . Multiple Vitamin (MULTIVITAMIN) tablet Take 1 tablet by mouth daily.    . propranolol (INDERAL) 10 MG tablet TAKE 1 TABLET BY MOUTH FOUR TIMES DAILY AS NEEDED FOR PALPITATIONS 120 tablet 3  . tamoxifen (NOLVADEX) 20 MG tablet Take 1 tablet (20 mg total) by mouth daily. 90 tablet 3  . venlafaxine XR (EFFEXOR-XR) 150 MG 24 hr  capsule Take 150 mg by mouth daily with breakfast.     No current facility-administered medications for this visit.    PHYSICAL EXAMINATION: ECOG PERFORMANCE STATUS: 1 - Symptomatic but completely ambulatory  Vitals:   11/03/19 1342  BP: (!) 114/59  Pulse: 89  Resp: 20  Temp: 97.8 F (36.6 C)  SpO2: 100%   Filed Weights   11/03/19 1342  Weight: 146 lb 12.8 oz (66.6 kg)    BREAST: No palpable masses or nodules in either right or left breasts. No palpable axillary supraclavicular or infraclavicular adenopathy no breast tenderness or nipple discharge. (exam performed in the presence of a chaperone)  LABORATORY DATA:  I have reviewed the data as listed CMP Latest Ref Rng & Units 08/03/2018 03/24/2018 12/29/2014  Glucose 70 - 99 mg/dL 97 87 87  BUN 6 - 20 mg/dL _0 Creatinine 0.44 - 1.00 mg/dL 0.74 0.74 0.61  Sodium 135 - 145 mmol/L 142 138 138  Potassium 3.5 - 5.1 mmol/L 4.0 4.7 4.4  Chloride 98 - 111 mmol/L 108 102 103  CO2 22 - 32 mmol/L _1 Calcium 8.9 - 10.3 mg/dL 9.0 9.4 8.9  Total Protein 6.5 - 8.1 g/dL 6.8 7.1 6.5  Total Bilirubin 0.3 - 1.2 mg/dL 0.4 0.3 0.3  Alkaline Phos 38 - 126 U/L 61 71 42  AST 15 - 41 U/L 13(L) 21 19  ALT 0 - 44 U/L _2 Lab Results  Component Value Date   WBC 7.3 08/03/2018   HGB 13.2 08/03/2018   HCT 42.1 08/03/2018   MCV 84.0 08/03/2018   PLT 404 (H) 08/03/2018   NEUTROABS 5.3 08/03/2018    ASSESSMENT & PLAN:  Malignant neoplasm of upper-outer quadrant of left breast in female, estrogen receptor positive ([place]) 05/07/2018:Leftlumpectomy: IDC grade 1, 1.2 cm, intermediate grade DCIS, margins negative,PASH, 0/7 sentinel lymph nodes negative, ER 95%, PR 95%, Ki-67 1%, HER-2 negative ratio 1.47, T1CN0 stage Ia Oncotype DX score 11: 3% risk of distant recurrence, low risk Adjuvant radiation therapy completed 08/03/2018  Current treatment: Adjuvant antiestrogen therapy with tamoxifen 20 mg daily x10 years started  09/10/2018 Tamoxifen toxicities: Mild intermittent hot flashes  Breast cancer surveillance: 1.  Breast exam 11/03/2019: Benign 2.  Mammogram 03/15/2019: Benign, breast density category C  Left breast tenderness and lymphedema: I prescribed her gabapentin to be taken at bedtime on an as-needed basis. She will continue to use lymphedema treatments that she has with flexi touch  Return to clinic in 1 year for follow-up   No orders of the defined types were placed in this encounter.  The patient has a good understanding of the overall plan. she agrees with it. she will call with any problems that may develop before the next visit here.  Total time spent: 20 mins including face to face time and time spent for planning, charting and coordination of care  Nicholas Lose, MD 11/03/2019  I, Cloyde Reams Dorshimer, am acting as scribe for Dr. Nicholas Lose.  I have reviewed the above documentation for accuracy and completeness, and  I agree with the above.       

## 2019-11-03 ENCOUNTER — Inpatient Hospital Stay: Payer: 59 | Attending: Hematology and Oncology | Admitting: Hematology and Oncology

## 2019-11-03 ENCOUNTER — Other Ambulatory Visit: Payer: Self-pay

## 2019-11-03 DIAGNOSIS — Z79810 Long term (current) use of selective estrogen receptor modulators (SERMs): Secondary | ICD-10-CM | POA: Insufficient documentation

## 2019-11-03 DIAGNOSIS — I890 Lymphedema, not elsewhere classified: Secondary | ICD-10-CM | POA: Diagnosis not present

## 2019-11-03 DIAGNOSIS — R232 Flushing: Secondary | ICD-10-CM | POA: Diagnosis not present

## 2019-11-03 DIAGNOSIS — Z170 Estrogen receptor positive status [ER+]: Secondary | ICD-10-CM | POA: Diagnosis not present

## 2019-11-03 DIAGNOSIS — Z923 Personal history of irradiation: Secondary | ICD-10-CM | POA: Insufficient documentation

## 2019-11-03 DIAGNOSIS — Z79899 Other long term (current) drug therapy: Secondary | ICD-10-CM | POA: Diagnosis not present

## 2019-11-03 DIAGNOSIS — C50412 Malignant neoplasm of upper-outer quadrant of left female breast: Secondary | ICD-10-CM | POA: Insufficient documentation

## 2019-11-03 DIAGNOSIS — N644 Mastodynia: Secondary | ICD-10-CM | POA: Diagnosis not present

## 2019-11-03 MED ORDER — GABAPENTIN 100 MG PO CAPS: 100.0000 mg | ORAL_CAPSULE | Freq: Every day | ORAL | 3 refills | Status: DC

## 2019-11-03 MED FILL — GABAPENTIN 100 MG CAPSULE: 100 | 30 days supply | Qty: 30 | Fill #0

## 2019-11-03 NOTE — Assessment & Plan Note (Signed)
05/07/2018:Leftlumpectomy: IDC grade 1, 1.2 cm, intermediate grade DCIS, margins negative,PASH, 0/7 sentinel lymph nodes negative, ER 95%, PR 95%, Ki-67 1%, HER-2 negative ratio 1.47, T1CN0 stage Ia Oncotype DX score 11: 3% risk of distant recurrence, low risk Adjuvant radiation therapy completed 08/03/2018  Current treatment: Adjuvant antiestrogen therapy with tamoxifen 20 mg daily x10 years started 09/10/2018 Tamoxifen toxicities:  Breast cancer surveillance: 1.  Breast exam 11/03/2019: Benign 2.  Mammogram 03/15/2019: Benign, breast density category C  Return to clinic in 1 year for follow-up

## 2019-11-13 DIAGNOSIS — Z0000 Encounter for general adult medical examination without abnormal findings: Secondary | ICD-10-CM | POA: Diagnosis not present

## 2019-11-17 DIAGNOSIS — C50412 Malignant neoplasm of upper-outer quadrant of left female breast: Secondary | ICD-10-CM | POA: Diagnosis not present

## 2019-11-17 DIAGNOSIS — Q8500 Neurofibromatosis, unspecified: Secondary | ICD-10-CM | POA: Diagnosis not present

## 2019-11-17 DIAGNOSIS — Z0000 Encounter for general adult medical examination without abnormal findings: Secondary | ICD-10-CM | POA: Diagnosis not present

## 2019-11-17 DIAGNOSIS — D510 Vitamin B12 deficiency anemia due to intrinsic factor deficiency: Secondary | ICD-10-CM | POA: Diagnosis not present

## 2019-11-17 DIAGNOSIS — E782 Mixed hyperlipidemia: Secondary | ICD-10-CM | POA: Diagnosis not present

## 2019-11-17 DIAGNOSIS — F321 Major depressive disorder, single episode, moderate: Secondary | ICD-10-CM | POA: Diagnosis not present

## 2019-11-17 DIAGNOSIS — Z170 Estrogen receptor positive status [ER+]: Secondary | ICD-10-CM | POA: Diagnosis not present

## 2019-11-17 DIAGNOSIS — D509 Iron deficiency anemia, unspecified: Secondary | ICD-10-CM | POA: Diagnosis not present

## 2019-11-22 ENCOUNTER — Other Ambulatory Visit: Payer: Self-pay | Admitting: *Deleted

## 2019-11-22 DIAGNOSIS — Z170 Estrogen receptor positive status [ER+]: Secondary | ICD-10-CM

## 2019-11-22 DIAGNOSIS — C50412 Malignant neoplasm of upper-outer quadrant of left female breast: Secondary | ICD-10-CM

## 2019-11-23 ENCOUNTER — Other Ambulatory Visit: Payer: Self-pay | Admitting: Radiation Therapy

## 2019-11-26 MED FILL — TAMOXIFEN 20 MG TABLET: 20 | 90 days supply | Qty: 90 | Fill #1

## 2019-11-27 MED FILL — GABAPENTIN 100 MG CAPSULE: 100 | 30 days supply | Qty: 30 | Fill #1

## 2019-12-02 ENCOUNTER — Other Ambulatory Visit: Payer: Self-pay

## 2019-12-02 ENCOUNTER — Ambulatory Visit (HOSPITAL_COMMUNITY)
Admission: RE | Admit: 2019-12-02 | Discharge: 2019-12-02 | Disposition: A | Discharge status: Home / Self Care | Payer: 59 | Source: Ambulatory Visit | Attending: Internal Medicine | Admitting: Internal Medicine

## 2019-12-02 DIAGNOSIS — Z853 Personal history of malignant neoplasm of breast: Secondary | ICD-10-CM | POA: Diagnosis not present

## 2019-12-02 DIAGNOSIS — C50412 Malignant neoplasm of upper-outer quadrant of left female breast: Secondary | ICD-10-CM | POA: Diagnosis not present

## 2019-12-02 DIAGNOSIS — Z170 Estrogen receptor positive status [ER+]: Secondary | ICD-10-CM | POA: Insufficient documentation

## 2019-12-02 DIAGNOSIS — Q8500 Neurofibromatosis, unspecified: Secondary | ICD-10-CM | POA: Diagnosis not present

## 2019-12-02 MED ORDER — GADOBUTROL 1 MMOL/ML IV SOLN
7.0000 mL | Freq: Once | INTRAVENOUS | Status: AC | PRN
  Administered 2019-12-02: 7 mL via INTRAVENOUS

## 2019-12-03 ENCOUNTER — Inpatient Hospital Stay: Payer: 59 | Attending: Hematology and Oncology | Admitting: Internal Medicine

## 2019-12-03 ENCOUNTER — Other Ambulatory Visit: Payer: Self-pay

## 2019-12-03 VITALS — BP 122/67 | HR 79 | Temp 98.5°F | Resp 18 | Ht 62.0 in | Wt 145.2 lb

## 2019-12-03 DIAGNOSIS — Z923 Personal history of irradiation: Secondary | ICD-10-CM | POA: Diagnosis not present

## 2019-12-03 DIAGNOSIS — Z809 Family history of malignant neoplasm, unspecified: Secondary | ICD-10-CM | POA: Diagnosis not present

## 2019-12-03 DIAGNOSIS — R000 Tachycardia, unspecified: Secondary | ICD-10-CM | POA: Diagnosis not present

## 2019-12-03 DIAGNOSIS — D3610 Benign neoplasm of peripheral nerves and autonomic nervous system, unspecified: Secondary | ICD-10-CM | POA: Insufficient documentation

## 2019-12-03 DIAGNOSIS — Z170 Estrogen receptor positive status [ER+]: Secondary | ICD-10-CM | POA: Insufficient documentation

## 2019-12-03 DIAGNOSIS — Z803 Family history of malignant neoplasm of breast: Secondary | ICD-10-CM | POA: Diagnosis not present

## 2019-12-03 DIAGNOSIS — F419 Anxiety disorder, unspecified: Secondary | ICD-10-CM | POA: Diagnosis not present

## 2019-12-03 DIAGNOSIS — C50412 Malignant neoplasm of upper-outer quadrant of left female breast: Secondary | ICD-10-CM | POA: Diagnosis not present

## 2019-12-03 DIAGNOSIS — Z8249 Family history of ischemic heart disease and other diseases of the circulatory system: Secondary | ICD-10-CM | POA: Insufficient documentation

## 2019-12-03 DIAGNOSIS — Z79899 Other long term (current) drug therapy: Secondary | ICD-10-CM | POA: Diagnosis not present

## 2019-12-03 DIAGNOSIS — Z833 Family history of diabetes mellitus: Secondary | ICD-10-CM | POA: Insufficient documentation

## 2019-12-03 DIAGNOSIS — Z823 Family history of stroke: Secondary | ICD-10-CM | POA: Diagnosis not present

## 2019-12-03 DIAGNOSIS — Z820 Family history of epilepsy and other diseases of the nervous system: Secondary | ICD-10-CM | POA: Insufficient documentation

## 2019-12-03 DIAGNOSIS — Q8500 Neurofibromatosis, unspecified: Secondary | ICD-10-CM | POA: Diagnosis not present

## 2019-12-03 NOTE — Progress Notes (Signed)
[place] at [place], [place] [address] [phone]   New Patient Evaluation  Date of Service: 12/03/19 Patient Name: Anne Horton [facility] Patient MRN: [id number] Patient DOB: 1976-04-27 Provider: Ventura Sellers, MD  Identifying Statement:  Anne Horton is a 44 y.o. female with Neurofibromatosis ([place]) [Q85.00] who presents for initial consultation and evaluation regarding cancer associated neurologic deficits.    Referring Provider: Osie Cheeks, PA-C [place],  [place] [id number]  Primary Cancer:  Oncologic History: Oncology History  Malignant neoplasm of upper-outer quadrant of left breast in female, estrogen receptor positive ([place])  03/20/2018 Initial Diagnosis   Screening detected left breast spiculated mass by ultrasound measured 1.2 cm.  Biopsy revealed invasive ductal carcinoma grade 1-2 with high-grade DCIS, ER 95%, PR 95%, Ki-67 1%, HER-2 negative ratio 1.47, T1CN0 stage I a clinical stage   03/20/2018 Cancer Staging   Staging form: Breast, AJCC 8th Edition - Clinical stage from 03/20/2018: Stage IA (cT1c, cN0, cM0, G2, ER+, PR+, HER2-) - Signed by Gardenia Phlegm, NP on 05/27/2018   04/08/2018 Genetic Testing   VUS in NF1, otherwise negative.  Genes tested: APC, ATM, AXIN2, BARD1, BMPR1A, BRCA1, BRCA2, BRIP1, CDH1, CDKN2A (p14ARF), CDKN2A (p16INK4a), CKD4, CHEK2, CTNNA1, DICER1, EPCAM (Deletion/duplication testing only), GREM1 (promoter region deletion/duplication testing only), KIT, MEN1, MLH1, MSH2, MSH3, MSH6, MUTYH, NBN, NF1, NHTL1, PALB2, PDGFRA, PMS2, POLD1, POLE, PTEN, RAD50, RAD51C, RAD51D, SDHB, SDHC, SDHD, SMAD4, SMARCA4. STK11, TP53, TSC1, TSC2, and VHL.  The following genes were evaluated for sequence changes only: SDHA and HOXB13 c.251G>A variant only.   05/07/2018 Surgery   Left lumpectomy: IDC grade 1, 1.2 cm, intermediate grade DCIS, margins negative,PASH, 0/7 sentinel lymph  nodes negative, ER 95%, PR 95%, Ki-67 1%, HER-2 negative ratio 1.47, T1CN0 stage Ia   05/07/2018 Cancer Staging   Staging form: Breast, AJCC 8th Edition - Pathologic stage from 05/07/2018: Stage IA (pT1c, pN0, cM0, G1, ER+, PR+, HER2-, Oncotype DX score: 11) - Signed by Gardenia Phlegm, NP on 05/27/2018   05/25/2018 Oncotype testing   Oncotype DX score 11: 3% risk of distant recurrence of 9 years, low risk   06/24/2018 - 08/03/2018 Radiation Therapy   Adjuvant radiation therapy   09/2018 -  Anti-estrogen oral therapy   Tamoxifen daily     History of Present Illness: The patient's records from the referring physician were obtained and reviewed and the patient interviewed to confirm this HPI.  Anne Horton presents today for routine follow up for neurofibromatosis type-1.  She describes no recent neurologic deficits, no headaches or seizures.  She had prior set of brain and spine scans performed in 2012.  Continues to have cutaneous neurofibromas.  She does complain of frequent bouts of anxiety, and fast heart rate necessitating standing and breakthrough beta blocker through her cardiologist.  Medications: Current Outpatient Medications on File Prior to Visit  Medication Sig Dispense Refill  . ALPRAZolam (XANAX) 0.5 MG tablet Take 0.5 mg by mouth at bedtime as needed (FOR ANXIETY). :take 1/2 to 1 twice a day as needed    . cetirizine (ZYRTEC) 10 MG tablet Take 10 mg by mouth as needed for allergies.     Marland Kitchen gabapentin (NEURONTIN) 100 MG capsule Take 1 capsule (100 mg total) by mouth at bedtime. 30 capsule 3  . metoprolol succinate (TOPROL-XL) 50 MG 24 hr tablet TAKE 1 TABLET BY MOUTH DAILY. TAKE WITH OR IMMEDIATELY FOLLOWING A MEAL.  90 tablet 3  . Multiple Vitamin (MULTIVITAMIN) tablet Take 1 tablet by mouth daily.    . propranolol (INDERAL) 10 MG tablet TAKE 1 TABLET BY MOUTH FOUR TIMES DAILY AS NEEDED FOR PALPITATIONS 120 tablet 3  . tamoxifen (NOLVADEX) 20 MG tablet Take 1 tablet  (20 mg total) by mouth daily. 90 tablet 3  . venlafaxine XR (EFFEXOR-XR) 150 MG 24 hr capsule Take 150 mg by mouth daily with breakfast.     No current facility-administered medications on file prior to visit.    Allergies:  Allergies  Allergen Reactions  . Latex Rash   Past Medical History:  Past Medical History:  Diagnosis Date  . Allergy   . Anemia   . Anxiety   . Breast cancer ([place]) 05/07/2018   left invasive ductal   . Cancer ([place])   . Chest pain   . Depression   . Family history of breast cancer   . Fibromyalgia    neurofibromatosis  . H/O: depression   . Hx of migraines   . Mitral regurgitation    mild per echo EF 55-60%  . Neurofibromatosis ([place])   . Palpitations   . Personal history of radiation therapy   . Tachycardia    Hx. of  . Wears contact lenses    Past Surgical History:  Past Surgical History:  Procedure Laterality Date  . BREAST BIOPSY    . BREAST LUMPECTOMY Left 05/07/2018   Invasive ductal   . BREAST LUMPECTOMY WITH RADIOACTIVE SEED AND SENTINEL LYMPH NODE BIOPSY Left 05/07/2018   Procedure: BREAST LUMPECTOMY WITH RADIOACTIVE SEED AND SENTINEL LYMPH NODE BIOPSY;  Surgeon: Rolm Bookbinder, MD;  Location: [facility];  Service: General;  Laterality: Left;  . CESAREAN SECTION    . CHOLECYSTECTOMY N/A 01/04/2015   Procedure: LAPAROSCOPIC CHOLECYSTECTOMY WITH INTRAOPERATIVE CHOLANGIOGRAM;  Surgeon: Erroll Luna, MD;  Location: [place];  Service: General;  Laterality: N/A;  . COLONOSCOPY    . ESOPHAGOGASTRODUODENOSCOPY    . WISDOM TOOTH EXTRACTION     Social History:  Social History   Socioeconomic History  . Marital status: Married    Spouse name: Not on file  . Number of children: 1  . Years of education: Not on file  . Highest education level: Not on file  Occupational History  . Occupation: [employer]  Tobacco Use  . Smoking status: Never Smoker  . Smokeless tobacco: Never Used  Substance and Sexual  Activity  . Alcohol use: Yes    Alcohol/week: 0.0 standard drinks    Comment: occasional but rarely  . Drug use: No  . Sexual activity: Yes    Birth control/protection: None  Other Topics Concern  . Not on file  Social History Narrative   Pt lives with husband and daughter   Social Determinants of Health   Financial Resource Strain:   . Difficulty of Paying Living Expenses:   Food Insecurity:   . Worried About Charity fundraiser in the Last Year:   . Arboriculturist in the Last Year:   Transportation Needs:   . Film/video editor (Medical):   Marland Kitchen Lack of Transportation (Non-Medical):   Physical Activity:   . Days of Exercise per Week:   . Minutes of Exercise per Session:   Stress:   . Feeling of Stress :   Social Connections:   . Frequency of Communication with Friends and Family:   . Frequency of Social Gatherings with Friends and Family:   .  Attends Religious Services:   . Active Member of Clubs or Organizations:   . Attends Archivist Meetings:   Marland Kitchen Marital Status:   Intimate Partner Violence:   . Fear of Current or Ex-Partner:   . Emotionally Abused:   Marland Kitchen Physically Abused:   . Sexually Abused:    Family History:  Family History  Problem Relation Age of Onset  . Breast cancer Mother 78       estrogen negative, recurred in 2012  . Diabetes Mother   . Neurofibromatosis Father   . Atrial fibrillation Maternal Grandfather   . Cancer Paternal Grandmother        type unk, died at 43  . Neurofibromatosis Paternal Grandmother   . Stroke Paternal Grandfather   . Heart disease Paternal Uncle   . Diabetes Paternal Uncle   . Heart attack Neg Hx   . Hypertension Neg Hx     Review of Systems: Constitutional: Doesn't report fevers, chills or abnormal weight loss Eyes: Doesn't report blurriness of vision Ears, nose, mouth, throat, and face: Doesn't report sore throat Respiratory: Doesn't report cough, dyspnea or wheezes Cardiovascular:  +palpitations Gastrointestinal:  Doesn't report nausea, constipation, diarrhea GU: Doesn't report incontinence Skin: Doesn't report skin rashes Neurological: Per HPI Musculoskeletal: Doesn't report joint pain Behavioral/Psych: +anxiety  Physical Exam: Vitals:   12/03/19 0907  BP: 122/67  Pulse: 79  Resp: 18  Temp: 98.5 F (36.9 C)  SpO2: 100%   KPS: 90. General: Alert, cooperative, pleasant, in no acute distress Head: Normal EENT: No conjunctival injection or scleral icterus.  Lungs: Resp effort normal Cardiac: Regular rate Abdomen: Non-distended abdomen Skin: No rashes cyanosis or petechiae. Extremities: No clubbing or edema  Neurologic Exam: Mental Status: Awake, alert, attentive to examiner. Oriented to self and environment. Language is fluent with intact comprehension.  Cranial Nerves: Visual acuity is grossly normal. Visual fields are full. Extra-ocular movements intact. No ptosis. Face is symmetric Motor: Tone and bulk are normal. Power is full in both arms and legs. Reflexes are symmetric, no pathologic reflexes present.  Sensory: Intact to light touch Gait: Normal.   Labs: I have reviewed the data as listed    Component Value Date/Time   NA 142 08/03/2018 0801   K 4.0 08/03/2018 0801   CL 108 08/03/2018 0801   CO2 25 08/03/2018 0801   GLUCOSE 97 08/03/2018 0801   BUN 8 08/03/2018 0801   CREATININE 0.74 08/03/2018 0801   CALCIUM 9.0 08/03/2018 0801   PROT 6.8 08/03/2018 0801   ALBUMIN 3.8 08/03/2018 0801   AST 13 (L) 08/03/2018 0801   ALT 10 08/03/2018 0801   ALKPHOS 61 08/03/2018 0801   BILITOT 0.4 08/03/2018 0801   GFRNONAA >60 08/03/2018 0801   GFRAA >60 08/03/2018 0801   Lab Results  Component Value Date   WBC 7.3 08/03/2018   NEUTROABS 5.3 08/03/2018   HGB 13.2 08/03/2018   HCT 42.1 08/03/2018   MCV 84.0 08/03/2018   PLT 404 (H) 08/03/2018    Imaging:  MR Brain W Wo Contrast  Result Date: 12/02/2019 CLINICAL DATA:  History of breast  cancer and neurofibromatosis. Evaluate for brain lesions. EXAM: MRI HEAD WITHOUT AND WITH CONTRAST TECHNIQUE: Multiplanar, multiecho pulse sequences of the brain and surrounding structures were obtained without and with intravenous contrast. CONTRAST:  34m GADAVIST GADOBUTROL 1 MMOL/ML IV SOLN COMPARISON:  MRI of the brain December 01, 2010 FINDINGS: Brain: No acute infarction, hemorrhage, hydrocephalus, extra-axial collection or mass lesion. The brain parenchyma  has normal morphology and signal characteristics. No focus of abnormal contrast enhancement identified. Vascular: Normal flow voids. Skull and upper cervical spine: Normal marrow signal. Sinuses/Orbits: Mild mucosal thickening of the ethmoid cells. The orbits are maintained. Other: None. IMPRESSION: No intracranial metastatic disease or lesions characteristic of neurofibromatosis identified. Unremarkable MRI of the brain. Electronically Signed   By: Pedro Earls M.D.   On: 12/02/2019 10:40     Assessment/Plan Neurofibromatosis ([place]) [Q85.00]  Ms. Stanish presents with overall clinical stability today.  Her brain MRI was reviewed and is normal.  No need for additional CNS imaging screening; high prevalence of MNST is understood by patient but will not image unless symptoms develop.  For sinus tachycardia, anxiety, sweating, we discussed greatly increased risk of pheochromocytoma in NF-1.  We will screen her as high risk given her genetic syndrome, with serum and urine metanephrines.    We spent twenty additional minutes teaching regarding the natural history, biology, and historical experience in the treatment of neurologic complications of cancer.   We appreciate the opportunity to participate in the care of [place].  We will revisit with her in person or via phone following lab testing as discussed.  All questions were answered. The patient knows to call the clinic with any problems, questions or concerns. No  barriers to learning were detected.  The total time spent in the encounter was 40 minutes and more than 50% was on counseling and review of test results   Anne Sellers, MD Medical Director of Neuro-Oncology [facility] [place] 12/03/19 9:16 AM

## 2019-12-07 ENCOUNTER — Other Ambulatory Visit: Payer: Self-pay

## 2019-12-07 ENCOUNTER — Inpatient Hospital Stay: Payer: 59

## 2019-12-07 DIAGNOSIS — C50412 Malignant neoplasm of upper-outer quadrant of left female breast: Secondary | ICD-10-CM | POA: Diagnosis not present

## 2019-12-07 DIAGNOSIS — Q8500 Neurofibromatosis, unspecified: Secondary | ICD-10-CM

## 2019-12-07 DIAGNOSIS — Z923 Personal history of irradiation: Secondary | ICD-10-CM | POA: Diagnosis not present

## 2019-12-07 DIAGNOSIS — Z170 Estrogen receptor positive status [ER+]: Secondary | ICD-10-CM | POA: Diagnosis not present

## 2019-12-07 DIAGNOSIS — D3610 Benign neoplasm of peripheral nerves and autonomic nervous system, unspecified: Secondary | ICD-10-CM | POA: Diagnosis not present

## 2019-12-07 DIAGNOSIS — Z803 Family history of malignant neoplasm of breast: Secondary | ICD-10-CM | POA: Diagnosis not present

## 2019-12-07 DIAGNOSIS — Z833 Family history of diabetes mellitus: Secondary | ICD-10-CM | POA: Diagnosis not present

## 2019-12-07 DIAGNOSIS — Z79899 Other long term (current) drug therapy: Secondary | ICD-10-CM | POA: Diagnosis not present

## 2019-12-07 DIAGNOSIS — R000 Tachycardia, unspecified: Secondary | ICD-10-CM | POA: Diagnosis not present

## 2019-12-07 DIAGNOSIS — F419 Anxiety disorder, unspecified: Secondary | ICD-10-CM | POA: Diagnosis not present

## 2019-12-08 MED FILL — VENLAFAXINE HCL ER 75 MG CA: 75 | 90 days supply | Qty: 180 | Fill #1

## 2019-12-13 DIAGNOSIS — Z170 Estrogen receptor positive status [ER+]: Secondary | ICD-10-CM | POA: Diagnosis not present

## 2019-12-13 DIAGNOSIS — F419 Anxiety disorder, unspecified: Secondary | ICD-10-CM | POA: Diagnosis not present

## 2019-12-13 DIAGNOSIS — Z833 Family history of diabetes mellitus: Secondary | ICD-10-CM | POA: Diagnosis not present

## 2019-12-13 DIAGNOSIS — D3610 Benign neoplasm of peripheral nerves and autonomic nervous system, unspecified: Secondary | ICD-10-CM | POA: Diagnosis not present

## 2019-12-13 DIAGNOSIS — Z79899 Other long term (current) drug therapy: Secondary | ICD-10-CM | POA: Diagnosis not present

## 2019-12-13 DIAGNOSIS — Z803 Family history of malignant neoplasm of breast: Secondary | ICD-10-CM | POA: Diagnosis not present

## 2019-12-13 DIAGNOSIS — C50412 Malignant neoplasm of upper-outer quadrant of left female breast: Secondary | ICD-10-CM | POA: Diagnosis not present

## 2019-12-13 DIAGNOSIS — Z923 Personal history of irradiation: Secondary | ICD-10-CM | POA: Diagnosis not present

## 2019-12-13 DIAGNOSIS — R000 Tachycardia, unspecified: Secondary | ICD-10-CM | POA: Diagnosis not present

## 2019-12-14 LAB — METANEPHRINES, PLASMA
Metanephrine, Free: 23.7 pg/mL (ref 0.0–88.0)
Normetanephrine, Free: 95.6 pg/mL (ref 0.0–125.8)

## 2019-12-19 LAB — METANEPHRINES, URINE, 24 HOUR
Metaneph Total, Ur: 69 ug/L
Metanephrines, 24H Ur: 83 ug/24 hr (ref 36–209)
Normetanephrine, 24H Ur: 395 ug/24 hr (ref 131–612)
Normetanephrine, Ur: 329 ug/L
Total Volume: 1200

## 2019-12-23 MED FILL — ALPRAZolam 0.5 MG TABS: 0.5 | 90 days supply | Qty: 180 | Fill #1

## 2019-12-23 MED FILL — GABAPENTIN 100 MG CAPSULE: 100 | 30 days supply | Qty: 30 | Fill #2

## 2020-01-07 DIAGNOSIS — C50412 Malignant neoplasm of upper-outer quadrant of left female breast: Secondary | ICD-10-CM | POA: Diagnosis not present

## 2020-02-02 ENCOUNTER — Other Ambulatory Visit: Payer: Self-pay | Admitting: Hematology and Oncology

## 2020-02-02 DIAGNOSIS — Z853 Personal history of malignant neoplasm of breast: Secondary | ICD-10-CM

## 2020-02-10 MED FILL — METOPROLOL SUCCINATE ER 50: 50 | 90 days supply | Qty: 90 | Fill #2

## 2020-02-22 DIAGNOSIS — F419 Anxiety disorder, unspecified: Secondary | ICD-10-CM | POA: Diagnosis not present

## 2020-02-22 DIAGNOSIS — Z79899 Other long term (current) drug therapy: Secondary | ICD-10-CM | POA: Diagnosis not present

## 2020-02-22 DIAGNOSIS — F330 Major depressive disorder, recurrent, mild: Secondary | ICD-10-CM | POA: Diagnosis not present

## 2020-03-15 ENCOUNTER — Other Ambulatory Visit: Payer: Self-pay

## 2020-03-15 ENCOUNTER — Ambulatory Visit
Admission: RE | Admit: 2020-03-15 | Discharge: 2020-03-15 | Disposition: A | Discharge status: Home / Self Care | Payer: 59 | Source: Ambulatory Visit | Attending: Hematology and Oncology | Admitting: Hematology and Oncology

## 2020-03-15 DIAGNOSIS — Z853 Personal history of malignant neoplasm of breast: Secondary | ICD-10-CM | POA: Diagnosis not present

## 2020-03-15 DIAGNOSIS — R922 Inconclusive mammogram: Secondary | ICD-10-CM | POA: Diagnosis not present

## 2020-03-15 MED FILL — GABAPENTIN 100 MG CAPSULE: 100 | 30 days supply | Qty: 30 | Fill #3

## 2020-03-16 ENCOUNTER — Ambulatory Visit
Admission: RE | Admit: 2020-03-16 | Discharge: 2020-03-16 | Disposition: A | Discharge status: Home / Self Care | Payer: 59 | Source: Ambulatory Visit | Attending: Radiation Oncology | Admitting: Radiation Oncology

## 2020-03-16 ENCOUNTER — Other Ambulatory Visit: Payer: Self-pay

## 2020-03-16 ENCOUNTER — Encounter: Payer: Self-pay | Admitting: Radiation Oncology

## 2020-03-16 DIAGNOSIS — Z79810 Long term (current) use of selective estrogen receptor modulators (SERMs): Secondary | ICD-10-CM | POA: Insufficient documentation

## 2020-03-16 DIAGNOSIS — Z170 Estrogen receptor positive status [ER+]: Secondary | ICD-10-CM

## 2020-03-16 DIAGNOSIS — C50412 Malignant neoplasm of upper-outer quadrant of left female breast: Secondary | ICD-10-CM | POA: Insufficient documentation

## 2020-03-16 DIAGNOSIS — Z79899 Other long term (current) drug therapy: Secondary | ICD-10-CM | POA: Insufficient documentation

## 2020-03-16 DIAGNOSIS — Z08 Encounter for follow-up examination after completed treatment for malignant neoplasm: Secondary | ICD-10-CM | POA: Diagnosis not present

## 2020-03-16 DIAGNOSIS — I890 Lymphedema, not elsewhere classified: Secondary | ICD-10-CM | POA: Diagnosis not present

## 2020-03-16 DIAGNOSIS — Z923 Personal history of irradiation: Secondary | ICD-10-CM | POA: Insufficient documentation

## 2020-03-16 DIAGNOSIS — N6459 Other signs and symptoms in breast: Secondary | ICD-10-CM | POA: Diagnosis not present

## 2020-03-16 MED FILL — VENLAFAXINE HCL ER 75 MG CA: 75 | 90 days supply | Qty: 180 | Fill #0

## 2020-03-16 NOTE — Progress Notes (Signed)
Radiation Oncology         [phone] ________________________________  Name: Anne Horton [facility] MRN: [id number]  Date: 08/02/2019  DOB: Feb 10, 1976  Follow-Up Visit Note  CC: [name], MD  Diagnosis: Stage I invasive ductal carcinoma of the left breast (pT1c, pN0)  Interval Since Last Radiation: One year, eight months, three weeks, and one day.  Radiation treatment dates: 06/24/2018 to 08/10/2018  Site/dose:    1. The Left breast was treated to 50.4 Gy in 28 fractions of 1.8 Gy. 2. The Left breast was boosted to 10 Gy in 5 fractions of 2 Gy.  Narrative:  The patient returns today for routine follow-up. She was last seen by Dr. Lindi Adie on 11/03/2019 and continues on adjuvant antiestrogen therapy with Tamoxifen x 10 years. She has not had any Tamoxifen-related toxicities to date.   She underwent a brain MRI on 12/02/2019 to evaluate for brain lesions. There was no intracranial metastatic disease nor were there any lesions characteristic of neurofibromatosis identified.  She was seen by Dr. Mickeal Skinner on 12/03/2019 for initial consultation and evaluation regarding cancer associated with neurologic deficits. She was noted to be clinically stable with a normal brain MRI. Thus, there was no need for additional CNS imaging/screening unless she was to develop symptoms.  She underwent a bilateral diagnostic mammogram yesterday, 03/15/2020, that did not show any evidence of malignancy in either breast. The left breast showed stable post-treatment changes.             On review of systems, she overall is doing better with her discomfort within the left breast.  She underwent physical therapy which helped with her lymphedema issue.  She does have a lymphedema machine at home (flexitouch) but is stopped using it.  When she saw Dr. Donne Hazel in the spring he felt her breast swelling was not related to lymphedema and she therefore stopped using.  She does continue to use  a compression bra while at work.  She denies any nipple discharge or bleeding.  She is contemplating breast reduction in the future if she continues to have persistent swelling in the left breast.  ALLERGIES:  is allergic to latex.  Meds: Current Outpatient Medications  Medication Sig Dispense Refill  . acetaminophen (TYLENOL) 500 MG tablet Take 500 mg by mouth every 6 (six) hours as needed.    . ALPRAZolam (XANAX) 0.5 MG tablet Take 0.5 mg by mouth at bedtime as needed (FOR ANXIETY). :take 1/2 to 1 twice a day as needed    . cetirizine (ZYRTEC) 10 MG tablet Take 10 mg by mouth as needed for allergies.     Marland Kitchen ibuprofen (ADVIL,MOTRIN) 200 MG tablet Take 800 mg by mouth daily as needed for headache.    . metoprolol succinate (TOPROL-XL) 50 MG 24 hr tablet TAKE 1 TABLET BY MOUTH DAILY. TAKE WITH OR IMMEDIATELY FOLLOWING A MEAL. 90 tablet 3  . propranolol (INDERAL) 10 MG tablet TAKE 1 TABLET BY MOUTH FOUR TIMES DAILY AS NEEDED FOR PALPITATIONS 120 tablet 3  . tamoxifen (NOLVADEX) 20 MG tablet Take 1 tablet (20 mg total) by mouth daily. 90 tablet 3  . venlafaxine XR (EFFEXOR-XR) 150 MG 24 hr capsule Take 150 mg by mouth daily with breakfast.     No current facility-administered medications for this encounter.     Physical Findings: The patient is in no acute distress. Patient is alert and oriented.  vitals were not taken for this visit.   Lungs are  clear to auscultation bilaterally. Heart has regular rate and rhythm. No palpable cervical, supraclavicular, or axillary adenopathy. Abdomen soft, non-tender, normal bowel sounds.   Breast Exam Right Breast: Large and pendulous without mass, nipple discharge or bleeding. Left Breast: Large and pendulous without mass nipple discharge or bleeding.  Mild hyperpigmentation changes noted.  Overall the swelling in the breast has reduced since my last exam.  The left breast continues to be larger than the right.  Lab Findings: Lab Results  Component Value  Date   WBC 7.3 08/03/2018   HGB 13.2 08/03/2018   HCT 42.1 08/03/2018   MCV 84.0 08/03/2018   PLT 404 (H) 08/03/2018    Radiographic Findings: No results found.  Impression: Stage I invasive ductal carcinoma of the left breast (pT1c, pN0)  No evidence of recurrence on clinical exam today.  Breast edema overall has improved since my last exam  Plan:  The patent will continue on Tamoxifen. She is scheduled to see Dr. Lindi Adie on 11/02/2020. Follow-up with radiation oncology in 6 months. If She is doing well at that time then we will likely release her to follow-up given her continued follow-up with medical oncology and surgery.    ____________________________________  Blair Promise, PhD, MD  This document serves as a record of services personally performed by [name], MD. It was created on his behalf by Clerance Lav, a trained medical scribe. The creation of this record is based on the scribe's personal observations and the provider's statements to them. This document has been checked and approved by the attending provider.

## 2020-03-16 NOTE — Progress Notes (Signed)
Patient here for a f/u visit with Dr. Sondra Come. Last radiation tx 11/19. Patient denies any pain, fatigue or lymphedema.  BP (!) 117/56 (BP Location: Right Arm, Patient Position: Sitting, Cuff Size: Normal)   Pulse 82   Temp 97.7 F (36.5 C)   Resp 18   Ht 5\' 2"  (1.575 m)   Wt 150 lb 6.4 oz (68.2 kg)   SpO2 100%   BMI 27.51 kg/m   Wt Readings from Last 3 Encounters:  03/16/20 150 lb 6.4 oz (68.2 kg)  08/02/19 143 lb (64.9 kg)  05/07/18 144 lb (65.3 kg)

## 2020-04-05 ENCOUNTER — Telehealth: Payer: Self-pay | Admitting: Hematology and Oncology

## 2020-04-05 NOTE — Telephone Encounter (Signed)
Rescheduled appointment per 7/1 message. Left message on patient voicemail with updated appointment date and time.

## 2020-04-24 ENCOUNTER — Telehealth: Payer: Self-pay | Admitting: Hematology and Oncology

## 2020-04-24 NOTE — Telephone Encounter (Signed)
Rescheduled appointment per 8/5 provider message. Left message with updated appointment date and time.

## 2020-05-11 MED FILL — METOPROLOL SUCCINATE ER 50: 50 | 90 days supply | Qty: 90 | Fill #3

## 2020-06-05 MED FILL — TAMOXIFEN 20 MG TABLET: 20 | 90 days supply | Qty: 90 | Fill #3

## 2020-06-23 ENCOUNTER — Other Ambulatory Visit: Payer: Self-pay

## 2020-06-23 ENCOUNTER — Inpatient Hospital Stay: Payer: 59 | Attending: Hematology and Oncology

## 2020-06-23 DIAGNOSIS — Z23 Encounter for immunization: Secondary | ICD-10-CM

## 2020-06-23 NOTE — Progress Notes (Signed)
   Covid-19 Vaccination Clinic  Name:  Anne Horton    MRN: [id number] DOB: 02/27/1976  06/23/2020  [name] was observed post Covid-19 immunization for 15 minutes without incident. She was provided with Vaccine Information Sheet and instruction to access the V-Safe system.   [name] was instructed to call 911 with any severe reactions post vaccine: Marland Kitchen Difficulty breathing  . Swelling of face and throat  . A fast heartbeat  . A bad rash all over body  . Dizziness and weakness

## 2020-07-10 ENCOUNTER — Telehealth: Payer: Self-pay | Admitting: Hematology and Oncology

## 2020-07-10 NOTE — Telephone Encounter (Signed)
I performed a brief examination because she was complaining of tenderness over the weekend in the left breast. Examination revealed mild to moderate left and breast lymphedema with tenderness in the 12 o'clock position as well as in the upper outer quadrant. I do not palpate any lumps or nodules.  There is normal breast contour. Recommendation: I suspect that the tenderness is related to physical exertion of the pectoralis muscle. Encouraged her to watch and monitor this over the next week and if she has persistent symptoms and we can get a mammogram and ultrasound for further evaluation. I also encouraged her to use her flexi touch to treat the lymphedema.

## 2020-07-27 DIAGNOSIS — Z853 Personal history of malignant neoplasm of breast: Secondary | ICD-10-CM | POA: Diagnosis not present

## 2020-07-27 DIAGNOSIS — N6489 Other specified disorders of breast: Secondary | ICD-10-CM | POA: Diagnosis not present

## 2020-07-27 DIAGNOSIS — Z923 Personal history of irradiation: Secondary | ICD-10-CM | POA: Diagnosis not present

## 2020-08-01 DIAGNOSIS — B373 Candidiasis of vulva and vagina: Secondary | ICD-10-CM | POA: Diagnosis not present

## 2020-08-01 DIAGNOSIS — Z01419 Encounter for gynecological examination (general) (routine) without abnormal findings: Secondary | ICD-10-CM | POA: Diagnosis not present

## 2020-08-01 DIAGNOSIS — Z8349 Family history of other endocrine, nutritional and metabolic diseases: Secondary | ICD-10-CM | POA: Diagnosis not present

## 2020-08-01 DIAGNOSIS — Z853 Personal history of malignant neoplasm of breast: Secondary | ICD-10-CM | POA: Diagnosis not present

## 2020-08-01 DIAGNOSIS — Z79899 Other long term (current) drug therapy: Secondary | ICD-10-CM | POA: Diagnosis not present

## 2020-08-01 DIAGNOSIS — N898 Other specified noninflammatory disorders of vagina: Secondary | ICD-10-CM | POA: Diagnosis not present

## 2020-08-01 DIAGNOSIS — Z01411 Encounter for gynecological examination (general) (routine) with abnormal findings: Secondary | ICD-10-CM | POA: Diagnosis not present

## 2020-08-01 DIAGNOSIS — C50412 Malignant neoplasm of upper-outer quadrant of left female breast: Secondary | ICD-10-CM | POA: Diagnosis not present

## 2020-08-01 DIAGNOSIS — Z1151 Encounter for screening for human papillomavirus (HPV): Secondary | ICD-10-CM | POA: Diagnosis not present

## 2020-08-01 DIAGNOSIS — Z8249 Family history of ischemic heart disease and other diseases of the circulatory system: Secondary | ICD-10-CM | POA: Diagnosis not present

## 2020-08-01 DIAGNOSIS — Z79811 Long term (current) use of aromatase inhibitors: Secondary | ICD-10-CM | POA: Diagnosis not present

## 2020-08-01 DIAGNOSIS — Z170 Estrogen receptor positive status [ER+]: Secondary | ICD-10-CM | POA: Diagnosis not present

## 2020-08-02 MED FILL — FLUCONAZOLE 200 MG TAB: 200 | 6 days supply | Qty: 3 | Fill #0

## 2020-08-09 ENCOUNTER — Other Ambulatory Visit (HOSPITAL_COMMUNITY): Payer: Self-pay | Admitting: Psychiatry

## 2020-08-09 DIAGNOSIS — F411 Generalized anxiety disorder: Secondary | ICD-10-CM | POA: Diagnosis not present

## 2020-08-09 DIAGNOSIS — Z79899 Other long term (current) drug therapy: Secondary | ICD-10-CM | POA: Diagnosis not present

## 2020-08-09 DIAGNOSIS — F330 Major depressive disorder, recurrent, mild: Secondary | ICD-10-CM | POA: Diagnosis not present

## 2020-08-09 MED FILL — ALPRAZolam 0.5 MG TABS: 0.5 | 90 days supply | Qty: 180 | Fill #0

## 2020-08-09 MED FILL — VENLAFAXINE HCL ER 75 MG CA: 75 | 90 days supply | Qty: 90 | Fill #0

## 2020-08-16 ENCOUNTER — Ambulatory Visit: Payer: 59 | Admitting: Physician Assistant

## 2020-08-16 ENCOUNTER — Other Ambulatory Visit: Payer: Self-pay

## 2020-08-16 ENCOUNTER — Other Ambulatory Visit: Payer: Self-pay | Admitting: Physician Assistant

## 2020-08-16 ENCOUNTER — Encounter: Payer: Self-pay | Admitting: Physician Assistant

## 2020-08-16 VITALS — BP 120/86 | HR 71 | Ht 62.0 in | Wt 146.0 lb

## 2020-08-16 DIAGNOSIS — R000 Tachycardia, unspecified: Secondary | ICD-10-CM | POA: Diagnosis not present

## 2020-08-16 DIAGNOSIS — R002 Palpitations: Secondary | ICD-10-CM

## 2020-08-16 DIAGNOSIS — I10 Essential (primary) hypertension: Secondary | ICD-10-CM | POA: Diagnosis not present

## 2020-08-16 DIAGNOSIS — E785 Hyperlipidemia, unspecified: Secondary | ICD-10-CM | POA: Diagnosis not present

## 2020-08-16 MED ORDER — PROPRANOLOL HCL 10 MG PO TABS: ORAL_TABLET | ORAL | 3 refills | Status: DC

## 2020-08-16 MED ORDER — METOPROLOL SUCCINATE ER 50 MG PO TB24: ORAL_TABLET | ORAL | 3 refills | Status: DC

## 2020-08-16 MED FILL — METOPROLOL SUCCINATE ER 50: 50 | 90 days supply | Qty: 90 | Fill #0

## 2020-08-16 MED FILL — PROPRANOLOL 10 MG TABLET: 10 | 30 days supply | Qty: 120 | Fill #0

## 2020-08-16 NOTE — Progress Notes (Signed)
Cardiology Office Note    Date:  08/16/2020   ID:  Anne Horton, DOB [birth year], MRN [id number]  PCP:  Osie Cheeks, PA-C  Cardiologist: Mertie Moores, MD EPS: None  Chief Complaint  Patient presents with  . Follow-up    History of Present Illness:  Anne Horton is a 44 y.o. female with history of HTN, Palpitations, breast CA, neurofibromatosis, anxiety. Normal stress echo 2011. Trace MR, TR  Last saw Dr. Acie Fredrickson 05/2019 and doing well.  Patient comes in today for  Regular f/u. She is scheduled  for Mastopexy 09/05/20 by Dr. Irene Limbo. Says she's doing well. No chest pain, palpitations, dyspnea, dizziness or presyncope. No regular exercise. Rarely uses propanolol.    Past Medical History:  Diagnosis Date  . Allergy   . Anemia   . Anxiety   . Breast cancer ([place]) 05/07/2018   left invasive ductal   . Cancer ([place])   . Chest pain   . Depression   . Family history of breast cancer   . Fibromyalgia    neurofibromatosis  . H/O: depression   . Hx of migraines   . Mitral regurgitation    mild per echo EF 55-60%  . Neurofibromatosis ([place])   . Palpitations   . Personal history of radiation therapy   . Tachycardia    Hx. of  . Wears contact lenses     Past Surgical History:  Procedure Laterality Date  . BREAST BIOPSY    . BREAST LUMPECTOMY Left 05/07/2018   Invasive ductal   . BREAST LUMPECTOMY WITH RADIOACTIVE SEED AND SENTINEL LYMPH NODE BIOPSY Left 05/07/2018   Procedure: BREAST LUMPECTOMY WITH RADIOACTIVE SEED AND SENTINEL LYMPH NODE BIOPSY;  Surgeon: Rolm Bookbinder, MD;  Location: [facility];  Service: General;  Laterality: Left;  . CESAREAN SECTION    . CHOLECYSTECTOMY N/A 01/04/2015   Procedure: LAPAROSCOPIC CHOLECYSTECTOMY WITH INTRAOPERATIVE CHOLANGIOGRAM;  Surgeon: Erroll Luna, MD;  Location: [facility];  Service: General;  Laterality: N/A;  . COLONOSCOPY    . ESOPHAGOGASTRODUODENOSCOPY    . WISDOM  TOOTH EXTRACTION      Current Medications: Current Meds  Medication Sig  . ALPRAZolam (XANAX) 0.5 MG tablet Take 0.5 mg by mouth at bedtime as needed (FOR ANXIETY). :take 1/2 to 1 twice a day as needed  . cetirizine (ZYRTEC) 10 MG tablet Take 10 mg by mouth as needed for allergies.   Marland Kitchen gabapentin (NEURONTIN) 100 MG capsule Take 1 capsule (100 mg total) by mouth at bedtime.  . metoprolol succinate (TOPROL-XL) 50 MG 24 hr tablet TAKE 1 TABLET BY MOUTH DAILY. TAKE WITH OR IMMEDIATELY FOLLOWING A MEAL.  . Multiple Vitamin (MULTIVITAMIN) tablet Take 1 tablet by mouth daily.  . propranolol (INDERAL) 10 MG tablet TAKE 1 TABLET BY MOUTH FOUR TIMES DAILY AS NEEDED FOR PALPITATIONS  . tamoxifen (NOLVADEX) 20 MG tablet Take 1 tablet (20 mg total) by mouth daily.  Marland Kitchen venlafaxine XR (EFFEXOR-XR) 150 MG 24 hr capsule Take 150 mg by mouth daily with breakfast.  . [DISCONTINUED] metoprolol succinate (TOPROL-XL) 50 MG 24 hr tablet TAKE 1 TABLET BY MOUTH DAILY. TAKE WITH OR IMMEDIATELY FOLLOWING A MEAL.  . [DISCONTINUED] propranolol (INDERAL) 10 MG tablet TAKE 1 TABLET BY MOUTH FOUR TIMES DAILY AS NEEDED FOR PALPITATIONS     Allergies:   Latex   Social History   Socioeconomic History  . Marital status: Married    Spouse name: Not on file  . Number  of children: 1  . Years of education: Not on file  . Highest education level: Not on file  Occupational History  . Occupation: [employer]  Tobacco Use  . Smoking status: Never Smoker  . Smokeless tobacco: Never Used  Vaping Use  . Vaping Use: Never used  Substance and Sexual Activity  . Alcohol use: Yes    Alcohol/week: 0.0 standard drinks    Comment: occasional but rarely  . Drug use: No  . Sexual activity: Yes    Birth control/protection: None  Other Topics Concern  . Not on file  Social History Narrative   Pt lives with husband and daughter   Social Determinants of Health   Financial Resource Strain:   . Difficulty of Paying Living Expenses:  Not on file  Food Insecurity:   . Worried About Charity fundraiser in the Last Year: Not on file  . Ran Out of Food in the Last Year: Not on file  Transportation Needs:   . Lack of Transportation (Medical): Not on file  . Lack of Transportation (Non-Medical): Not on file  Physical Activity:   . Days of Exercise per Week: Not on file  . Minutes of Exercise per Session: Not on file  Stress:   . Feeling of Stress : Not on file  Social Connections:   . Frequency of Communication with Friends and Family: Not on file  . Frequency of Social Gatherings with Friends and Family: Not on file  . Attends Religious Services: Not on file  . Active Member of Clubs or Organizations: Not on file  . Attends Archivist Meetings: Not on file  . Marital Status: Not on file     Family History:  The patient's family history includes Atrial fibrillation in her maternal grandfather; Breast cancer (age of onset: 19) in her mother; Cancer in her paternal grandmother; Diabetes in her mother and paternal uncle; Heart disease in her paternal uncle; Neurofibromatosis in her father and paternal grandmother; Stroke in her paternal grandfather.   ROS:   Please see the history of present illness.    ROS All other systems reviewed and are negative.   PHYSICAL EXAM:   VS:  BP 120/86   Pulse 71   Ht 5\' 2"  (1.575 m)   Wt 146 lb (66.2 kg)   SpO2 96%   BMI 26.70 kg/m   Physical Exam  GEN: Well nourished, well developed, in no acute distress  Neck: no JVD, carotid bruits, or masses Cardiac:RRR; no murmurs, rubs, or gallops  Respiratory:  clear to auscultation bilaterally, normal work of breathing GI: soft, nontender, nondistended, + BS Ext: without cyanosis, clubbing, or edema, Good distal pulses bilaterally Neuro:  Alert and Oriented x 3 Psych: euthymic mood, full affect  Wt Readings from Last 3 Encounters:  08/16/20 146 lb (66.2 kg)  03/16/20 150 lb 6.4 oz (68.2 kg)  12/03/19 145 lb 3.2 oz (65.9  kg)      Studies/Labs Reviewed:   EKG:  EKG is  ordered today.  The ekg ordered today demonstrates NSR, normal EKG  Recent Labs: No results found for requested labs within last 8760 hours.   Lipid Panel    Component Value Date/Time   CHOL 225 (H) 03/25/2014 0830   TRIG 143.0 03/25/2014 0830   HDL 50.10 03/25/2014 0830   CHOLHDL 4 03/25/2014 0830   VLDL 28.6 03/25/2014 0830   LDLCALC 146 (H) 03/25/2014 0830    Additional studies/ records that were reviewed today  include:       ASSESSMENT:    1. Essential hypertension   2. Sinus tachycardia   3. Palpitations   4. Hyperlipidemia, unspecified hyperlipidemia type      PLAN:  In order of problems listed above:   HTN BP Well controlled on Toprol  Palpitations/sinus tachycardia managed with Toprol  Hyperlipidemia LDL 128 10/2019 diet and exercise managed by PCP.    Medication Adjustments/Labs and Tests Ordered: Current medicines are reviewed at length with the patient today.  Concerns regarding medicines are outlined above.  Medication changes, Labs and Tests ordered today are listed in the Patient Instructions below. Patient Instructions  Medication Instructions:  Your physician recommends that you continue on your current medications as directed. Please refer to the Current Medication list given to you today.  *If you need a refill on your cardiac medications before your next appointment, please call your pharmacy*   Lab Work: None If you have labs (blood work) drawn today and your tests are completely normal, you will receive your results only by: Marland Kitchen MyChart Message (if you have MyChart) OR . A paper copy in the mail If you have any lab test that is abnormal or we need to change your treatment, we will call you to review the results.   Follow-Up: At [facility], you and your health needs are our priority.  As part of our continuing mission to provide you with exceptional heart care, we have created  designated Provider Care Teams.  These Care Teams include your primary Cardiologist (physician) and Advanced Practice Providers (APPs -  Physician Assistants and Nurse Practitioners) who all work together to provide you with the care you need, when you need it.  Your next appointment:   1 year(s)  The format for your next appointment:   In Person  Provider:   You may see Mertie Moores, MD or one of the following Advanced Practice Providers on your designated Care Team:    Richardson Dopp, PA-C  Vin [address] PA-C       Signed, Ermalinda Barrios, [place]  08/16/2020 8:38 AM    [address], [place], [place]  [address] Phone: [phone]; Fax: [phone]

## 2020-08-16 NOTE — Patient Instructions (Signed)
Medication Instructions:  Your physician recommends that you continue on your current medications as directed. Please refer to the Current Medication list given to you today.  *If you need a refill on your cardiac medications before your next appointment, please call your pharmacy*   Lab Work: None If you have labs (blood work) drawn today and your tests are completely normal, you will receive your results only by: MyChart Message (if you have MyChart) OR A paper copy in the mail If you have any lab test that is abnormal or we need to change your treatment, we will call you to review the results.   Follow-Up: At [facility], you and your health needs are our priority.  As part of our continuing mission to provide you with exceptional heart care, we have created designated Provider Care Teams.  These Care Teams include your primary Cardiologist (physician) and Advanced Practice Providers (APPs -  Physician Assistants and Nurse Practitioners) who all work together to provide you with the care you need, when you need it.   Your next appointment:   1 year(s)  The format for your next appointment:   In Person  Provider:   You may see Philip [name], MD or one of the following Advanced Practice Providers on your designated Care Team:   Scott Weaver, PA-C Vin Bhagat, PA-C    

## 2020-08-20 ENCOUNTER — Encounter: Payer: Self-pay | Admitting: Hematology and Oncology

## 2020-08-21 ENCOUNTER — Other Ambulatory Visit: Payer: Self-pay | Admitting: *Deleted

## 2020-08-21 ENCOUNTER — Other Ambulatory Visit: Payer: Self-pay | Admitting: Hematology and Oncology

## 2020-08-21 MED ORDER — TAMOXIFEN CITRATE 20 MG PO TABS: 20.0000 mg | ORAL_TABLET | Freq: Every day | ORAL | 3 refills | Status: DC

## 2020-08-21 MED FILL — TAMOXIFEN 20 MG TABLET: 20 | 90 days supply | Qty: 90 | Fill #0

## 2020-08-28 ENCOUNTER — Other Ambulatory Visit (HOSPITAL_COMMUNITY): Payer: Self-pay | Admitting: Plastic Surgery

## 2020-08-28 DIAGNOSIS — Z853 Personal history of malignant neoplasm of breast: Secondary | ICD-10-CM | POA: Diagnosis not present

## 2020-08-28 DIAGNOSIS — N6489 Other specified disorders of breast: Secondary | ICD-10-CM | POA: Diagnosis not present

## 2020-08-28 DIAGNOSIS — Z923 Personal history of irradiation: Secondary | ICD-10-CM | POA: Diagnosis not present

## 2020-08-28 MED FILL — HYDROCODON-APAP 5-325: 5-325 | 4 days supply | Qty: 25 | Fill #0

## 2020-08-28 NOTE — H&P (Signed)
Subjective:     Patient ID: Anne Horton is a 44 y.o. female.  HPI  Here for follow up discussion prior to planned bilateral breast mastopexy. Diagnosed with left breast ca 2019. Underwent left lumpectomy with SLN. Final pathology 1.2 cm IDC, intermediate grade DCIS, margins negative, PASH, 0/7 SLN ER/PR+, Her2- (T1cN0 stage Ia). Oncotype 11. Completed adjuvant radiation 07/2018. On tamoxifen.  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 Post lumpectomy and RT has developed left breast edema with left larger. Completed PT. Notes with use Flexitouch garment breast will become softer but remains larger than right. Only able to wear compression bras now due to asymmetry size. Satisfied with right breast volume though also would be ok with smaller volume on this side.  Wt within 10 lb of weight at time lumpectomy.  MMG 03/15/20 normal  PMH includes depression and followed by Dr. Erling Cruz and neurofibromatosis. Review of chart notes intermittent diffuse left breast pain, improves with Flexitouch; this occurs a few times per month.  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 One daughter in college. States she does not have to lift or transfer patients with her work.  Review of Systems Remainder 12 point review negative    Objective:   Physical Exam Cardiovascular:     Rate and Rhythm: Normal rate and regular rhythm.     Heart sounds: Normal heart sounds.  Pulmonary:     Effort: Pulmonary effort is normal.     Breath sounds: Normal breath sounds.  Lymphadenopathy:     Upper Body:     Right upper body: No axillary adenopathy.     Left upper body: No axillary adenopathy.  Skin:    Comments: Fitzpatrick 2     Breasts: no masses left >right volume Left breast UOQ scar and axillary scar Presence of neurofibroma adjacent to left nipple- present for years and patient states ok if is resected with any procedure or if it remains SN to  nipple R 30 L 30 cm BW R 20 L 21.5 cm Nipple to IMF R 10 L 12 cm     Assessment:     History left breast ca s/p lumpectomy SLN Adjuvant radiation Post operative asymmetry breasts    Plan:     Hold tamoxifen from now through first post operative visit following surgery.  Reviewed increased risk complications in setting RT and lymphedema including wound healing problems. Recommend she wear compression garment daily for few weeks prior to surgery and resume post surgery when tolerated to control edema. Recommend surgery both breasts to allow for most symmetry, though over time will have recurrent asymmetry as radiated breast will not develop as much ptosis as right breast with aging.  Reviewed mastopexy with anchor type scars, OP surgery, drains, post operative visits and limitations, recovery, including work limitations. Diminished sensation nipple and breast skin, risk of nipple loss, asymmetry, incidental carcinoma, changes with wt gain/loss, aging, unacceptable cosmetic appearance reviewed. Would like larges NF left NAC excised- counseled will remove this if NAC healthy at time closure.  Additional risks including infection, seroma, hematoma, need for additional procedures, unacceptable cosmetic result, damage to adjacent structures, need for additional procedures, blood clots in legs or lungs reviewed.  Discussed risk COVID infectionthrough this elective surgery. Patient will receive COVID testing prior to surgery. Discussed even if patient receivesa negative test result, the tests in some cases may fail to detect the virus or patient maycontract COVID after  the test.COVID 19 infectionbefore/during/aftersurgery may result in lead to a higher chance of complication and death.  Drain teaching completed. Rx for Norco given.

## 2020-08-29 ENCOUNTER — Other Ambulatory Visit: Payer: Self-pay

## 2020-08-29 ENCOUNTER — Encounter (HOSPITAL_BASED_OUTPATIENT_CLINIC_OR_DEPARTMENT_OTHER): Payer: Self-pay | Admitting: Plastic Surgery

## 2020-09-01 ENCOUNTER — Other Ambulatory Visit (HOSPITAL_COMMUNITY): Payer: 59

## 2020-09-02 ENCOUNTER — Other Ambulatory Visit (HOSPITAL_COMMUNITY)
Admission: RE | Admit: 2020-09-02 | Discharge: 2020-09-02 | Disposition: A | Discharge status: Home / Self Care | Payer: 59 | Source: Ambulatory Visit | Attending: Plastic Surgery | Admitting: Plastic Surgery

## 2020-09-02 DIAGNOSIS — Z20822 Contact with and (suspected) exposure to covid-19: Secondary | ICD-10-CM | POA: Diagnosis not present

## 2020-09-02 DIAGNOSIS — Z01812 Encounter for preprocedural laboratory examination: Secondary | ICD-10-CM | POA: Diagnosis not present

## 2020-09-02 LAB — SARS CORONAVIRUS 2 (TAT 6-24 HRS): SARS Coronavirus 2: NEGATIVE

## 2020-09-05 ENCOUNTER — Ambulatory Visit (HOSPITAL_BASED_OUTPATIENT_CLINIC_OR_DEPARTMENT_OTHER): Payer: 59 | Admitting: Anesthesiology

## 2020-09-05 ENCOUNTER — Encounter (HOSPITAL_BASED_OUTPATIENT_CLINIC_OR_DEPARTMENT_OTHER)
Admission: RE | Disposition: A | Discharge status: Home / Self Care | Payer: Self-pay | Source: Home / Self Care | Attending: Plastic Surgery

## 2020-09-05 ENCOUNTER — Encounter (HOSPITAL_BASED_OUTPATIENT_CLINIC_OR_DEPARTMENT_OTHER): Payer: Self-pay | Admitting: Plastic Surgery

## 2020-09-05 ENCOUNTER — Other Ambulatory Visit: Payer: Self-pay

## 2020-09-05 ENCOUNTER — Ambulatory Visit (HOSPITAL_BASED_OUTPATIENT_CLINIC_OR_DEPARTMENT_OTHER)
Admission: RE | Admit: 2020-09-05 | Discharge: 2020-09-05 | Disposition: A | Discharge status: Home / Self Care | Payer: 59 | Attending: Plastic Surgery | Admitting: Plastic Surgery

## 2020-09-05 DIAGNOSIS — I972 Postmastectomy lymphedema syndrome: Secondary | ICD-10-CM | POA: Insufficient documentation

## 2020-09-05 DIAGNOSIS — Q8500 Neurofibromatosis, unspecified: Secondary | ICD-10-CM | POA: Insufficient documentation

## 2020-09-05 DIAGNOSIS — Z853 Personal history of malignant neoplasm of breast: Secondary | ICD-10-CM | POA: Insufficient documentation

## 2020-09-05 DIAGNOSIS — N651 Disproportion of reconstructed breast: Secondary | ICD-10-CM | POA: Diagnosis not present

## 2020-09-05 DIAGNOSIS — Z170 Estrogen receptor positive status [ER+]: Secondary | ICD-10-CM | POA: Insufficient documentation

## 2020-09-05 DIAGNOSIS — N6489 Other specified disorders of breast: Secondary | ICD-10-CM | POA: Diagnosis not present

## 2020-09-05 DIAGNOSIS — N6081 Other benign mammary dysplasias of right breast: Secondary | ICD-10-CM | POA: Diagnosis not present

## 2020-09-05 DIAGNOSIS — N6011 Diffuse cystic mastopathy of right breast: Secondary | ICD-10-CM | POA: Diagnosis not present

## 2020-09-05 DIAGNOSIS — Z923 Personal history of irradiation: Secondary | ICD-10-CM | POA: Diagnosis not present

## 2020-09-05 HISTORY — PX: MASTOPEXY: SHX5358

## 2020-09-05 LAB — POCT PREGNANCY, URINE: Preg Test, Ur: NEGATIVE

## 2020-09-05 SURGERY — MASTOPEXY
Anesthesia: General | Site: Breast | Laterality: Bilateral

## 2020-09-05 MED ORDER — FENTANYL CITRATE (PF) 100 MCG/2ML IJ SOLN
INTRAMUSCULAR | Status: AC
  Filled 2020-09-05: qty 2

## 2020-09-05 MED ORDER — SUGAMMADEX SODIUM 500 MG/5ML IV SOLN
INTRAVENOUS | Status: AC
  Filled 2020-09-05: qty 5

## 2020-09-05 MED ORDER — ACETAMINOPHEN 500 MG PO TABS: 1000.0000 mg | ORAL_TABLET | ORAL | Status: DC

## 2020-09-05 MED ORDER — BUPIVACAINE HCL (PF) 0.5 % IJ SOLN
INTRAMUSCULAR | Status: AC
  Filled 2020-09-05: qty 30

## 2020-09-05 MED ORDER — LIDOCAINE 2% (20 MG/ML) 5 ML SYRINGE
INTRAMUSCULAR | Status: AC
  Filled 2020-09-05: qty 5

## 2020-09-05 MED ORDER — MIDAZOLAM HCL 2 MG/2ML IJ SOLN
INTRAMUSCULAR | Status: AC
  Filled 2020-09-05: qty 2

## 2020-09-05 MED ORDER — GABAPENTIN 300 MG PO CAPS
ORAL_CAPSULE | ORAL | Status: AC
  Filled 2020-09-05: qty 1

## 2020-09-05 MED ORDER — PHENYLEPHRINE 40 MCG/ML (10ML) SYRINGE FOR IV PUSH (FOR BLOOD PRESSURE SUPPORT)
PREFILLED_SYRINGE | INTRAVENOUS | Status: AC
  Filled 2020-09-05: qty 10

## 2020-09-05 MED ORDER — OXYCODONE HCL 5 MG PO TABS
5.0000 mg | ORAL_TABLET | Freq: Once | ORAL | Status: AC
  Administered 2020-09-05: 5 mg via ORAL

## 2020-09-05 MED ORDER — SCOPOLAMINE 1 MG/3DAYS TD PT72
MEDICATED_PATCH | TRANSDERMAL | Status: AC
  Filled 2020-09-05: qty 1

## 2020-09-05 MED ORDER — 0.9 % SODIUM CHLORIDE (POUR BTL) OPTIME
TOPICAL | Status: DC | PRN
  Administered 2020-09-05: 400 mL

## 2020-09-05 MED ORDER — CELECOXIB 200 MG PO CAPS
200.0000 mg | ORAL_CAPSULE | ORAL | Status: AC
  Administered 2020-09-05: 200 mg via ORAL

## 2020-09-05 MED ORDER — CEFAZOLIN SODIUM-DEXTROSE 2-4 GM/100ML-% IV SOLN
2.0000 g | INTRAVENOUS | Status: AC
  Administered 2020-09-05: 2 g via INTRAVENOUS

## 2020-09-05 MED ORDER — SUGAMMADEX SODIUM 200 MG/2ML IV SOLN
INTRAVENOUS | Status: DC | PRN
  Administered 2020-09-05: 200 mg via INTRAVENOUS

## 2020-09-05 MED ORDER — CEFAZOLIN SODIUM-DEXTROSE 2-4 GM/100ML-% IV SOLN
INTRAVENOUS | Status: AC
  Filled 2020-09-05: qty 100

## 2020-09-05 MED ORDER — KETAMINE HCL 100 MG/ML IJ SOLN
INTRAMUSCULAR | Status: AC
  Filled 2020-09-05: qty 1

## 2020-09-05 MED ORDER — EPHEDRINE 5 MG/ML INJ
INTRAVENOUS | Status: AC
  Filled 2020-09-05: qty 10

## 2020-09-05 MED ORDER — ACETAMINOPHEN 500 MG PO TABS
ORAL_TABLET | ORAL | Status: AC
  Filled 2020-09-05: qty 2

## 2020-09-05 MED ORDER — DEXAMETHASONE SODIUM PHOSPHATE 10 MG/ML IJ SOLN
INTRAMUSCULAR | Status: AC
  Filled 2020-09-05: qty 1

## 2020-09-05 MED ORDER — LACTATED RINGERS IV SOLN: INTRAVENOUS | Status: DC

## 2020-09-05 MED ORDER — DEXAMETHASONE SODIUM PHOSPHATE 4 MG/ML IJ SOLN
INTRAMUSCULAR | Status: DC | PRN
  Administered 2020-09-05: 10 mg via INTRAVENOUS

## 2020-09-05 MED ORDER — FENTANYL CITRATE (PF) 100 MCG/2ML IJ SOLN
INTRAMUSCULAR | Status: DC | PRN
  Administered 2020-09-05 (×2): 100 ug via INTRAVENOUS
  Administered 2020-09-05: 50 ug via INTRAVENOUS

## 2020-09-05 MED ORDER — KETAMINE HCL 100 MG/ML IJ SOLN
INTRAMUSCULAR | Status: DC | PRN
  Administered 2020-09-05: 10 mg via INTRAVENOUS
  Administered 2020-09-05: 30 mg via INTRAVENOUS

## 2020-09-05 MED ORDER — DIPHENHYDRAMINE HCL 50 MG/ML IJ SOLN
INTRAMUSCULAR | Status: DC | PRN
  Administered 2020-09-05: 6.25 mg via INTRAVENOUS

## 2020-09-05 MED ORDER — CHLORHEXIDINE GLUCONATE CLOTH 2 % EX PADS: 6.0000 | MEDICATED_PAD | Freq: Once | CUTANEOUS | Status: DC

## 2020-09-05 MED ORDER — PROPOFOL 10 MG/ML IV BOLUS
INTRAVENOUS | Status: DC | PRN
Start: 2020-09-05 — End: 2020-09-05
  Administered 2020-09-05: 150 mg via INTRAVENOUS

## 2020-09-05 MED ORDER — PROPOFOL 500 MG/50ML IV EMUL
INTRAVENOUS | Status: AC
  Filled 2020-09-05: qty 50

## 2020-09-05 MED ORDER — MIDAZOLAM HCL 5 MG/5ML IJ SOLN
INTRAMUSCULAR | Status: DC | PRN
  Administered 2020-09-05: 2 mg via INTRAVENOUS

## 2020-09-05 MED ORDER — DIPHENHYDRAMINE HCL 50 MG/ML IJ SOLN
INTRAMUSCULAR | Status: AC
  Filled 2020-09-05: qty 1

## 2020-09-05 MED ORDER — CELECOXIB 200 MG PO CAPS
ORAL_CAPSULE | ORAL | Status: AC
  Filled 2020-09-05: qty 1

## 2020-09-05 MED ORDER — ROCURONIUM BROMIDE 10 MG/ML (PF) SYRINGE
PREFILLED_SYRINGE | INTRAVENOUS | Status: AC
  Filled 2020-09-05: qty 10

## 2020-09-05 MED ORDER — LIDOCAINE HCL (CARDIAC) PF 100 MG/5ML IV SOSY
PREFILLED_SYRINGE | INTRAVENOUS | Status: DC | PRN
  Administered 2020-09-05: 60 mg via INTRAVENOUS

## 2020-09-05 MED ORDER — FENTANYL CITRATE (PF) 100 MCG/2ML IJ SOLN
25.0000 ug | INTRAMUSCULAR | Status: DC | PRN
  Administered 2020-09-05: 25 ug via INTRAVENOUS
  Administered 2020-09-05: 50 ug via INTRAVENOUS
  Administered 2020-09-05: 25 ug via INTRAVENOUS

## 2020-09-05 MED ORDER — ONDANSETRON HCL 4 MG/2ML IJ SOLN
INTRAMUSCULAR | Status: DC | PRN
  Administered 2020-09-05: 4 mg via INTRAVENOUS

## 2020-09-05 MED ORDER — SCOPOLAMINE 1 MG/3DAYS TD PT72
1.0000 | MEDICATED_PATCH | TRANSDERMAL | Status: DC
  Administered 2020-09-05: 1.5 mg via TRANSDERMAL

## 2020-09-05 MED ORDER — ACETAMINOPHEN 500 MG PO TABS
1000.0000 mg | ORAL_TABLET | Freq: Once | ORAL | Status: AC
  Administered 2020-09-05: 1000 mg via ORAL

## 2020-09-05 MED ORDER — GABAPENTIN 300 MG PO CAPS
300.0000 mg | ORAL_CAPSULE | ORAL | Status: AC
  Administered 2020-09-05: 300 mg via ORAL

## 2020-09-05 MED ORDER — ONDANSETRON HCL 4 MG/2ML IJ SOLN
INTRAMUSCULAR | Status: AC
  Filled 2020-09-05: qty 2

## 2020-09-05 MED ORDER — SUCCINYLCHOLINE CHLORIDE 200 MG/10ML IV SOSY
PREFILLED_SYRINGE | INTRAVENOUS | Status: AC
  Filled 2020-09-05: qty 10

## 2020-09-05 MED ORDER — OXYCODONE HCL 5 MG PO TABS
ORAL_TABLET | ORAL | Status: AC
  Filled 2020-09-05: qty 1

## 2020-09-05 MED ORDER — BUPIVACAINE HCL (PF) 0.5 % IJ SOLN
INTRAMUSCULAR | Status: DC | PRN
  Administered 2020-09-05: 30 mL

## 2020-09-05 MED ORDER — ROCURONIUM BROMIDE 100 MG/10ML IV SOLN
INTRAVENOUS | Status: DC | PRN
  Administered 2020-09-05: 60 mg via INTRAVENOUS
  Administered 2020-09-05: 10 mg via INTRAVENOUS

## 2020-09-05 SURGICAL SUPPLY — 68 items
ADH SKN CLS APL DERMABOND .7 (GAUZE/BANDAGES/DRESSINGS) ×3
APL PRP STRL LF DISP 70% ISPRP (MISCELLANEOUS) ×2
BAG DECANTER FOR FLEXI CONT (MISCELLANEOUS) IMPLANT
BINDER BREAST LRG (GAUZE/BANDAGES/DRESSINGS) IMPLANT
BINDER BREAST MEDIUM (GAUZE/BANDAGES/DRESSINGS) IMPLANT
BINDER BREAST XLRG (GAUZE/BANDAGES/DRESSINGS) ×2 IMPLANT
BINDER BREAST XXLRG (GAUZE/BANDAGES/DRESSINGS) IMPLANT
BLADE SURG 10 STRL SS (BLADE) ×8 IMPLANT
BLADE SURG 15 STRL LF DISP TIS (BLADE) IMPLANT
BLADE SURG 15 STRL SS (BLADE)
BNDG GAUZE ELAST 4 BULKY (GAUZE/BANDAGES/DRESSINGS) ×4 IMPLANT
CANISTER SUCT 1200ML W/VALVE (MISCELLANEOUS) ×2 IMPLANT
CHLORAPREP W/TINT 26 (MISCELLANEOUS) ×4 IMPLANT
CLIP VESOCCLUDE MED 6/CT (CLIP) ×2 IMPLANT
COVER MAYO STAND STRL (DRAPES) ×2 IMPLANT
COVER WAND RF STERILE (DRAPES) IMPLANT
DECANTER SPIKE VIAL GLASS SM (MISCELLANEOUS) IMPLANT
DERMABOND ADVANCED (GAUZE/BANDAGES/DRESSINGS) ×3
DERMABOND ADVANCED .7 DNX12 (GAUZE/BANDAGES/DRESSINGS) ×3 IMPLANT
DRAIN CHANNEL 15F RND FF W/TCR (WOUND CARE) ×4 IMPLANT
DRAIN CHANNEL 19F RND (DRAIN) IMPLANT
DRAPE TOP ARMCOVERS (MISCELLANEOUS) ×2 IMPLANT
DRAPE U-SHAPE 76X120 STRL (DRAPES) ×2 IMPLANT
DRAPE UTILITY XL STRL (DRAPES) ×2 IMPLANT
DRSG PAD ABDOMINAL 8X10 ST (GAUZE/BANDAGES/DRESSINGS) ×4 IMPLANT
DRSG TEGADERM 2-3/8X2-3/4 SM (GAUZE/BANDAGES/DRESSINGS) IMPLANT
ELECT BLADE 4.0 EZ CLEAN MEGAD (MISCELLANEOUS)
ELECT COATED BLADE 2.86 ST (ELECTRODE) ×2 IMPLANT
ELECT REM PT RETURN 9FT ADLT (ELECTROSURGICAL) ×2
ELECTRODE BLDE 4.0 EZ CLN MEGD (MISCELLANEOUS) IMPLANT
ELECTRODE REM PT RTRN 9FT ADLT (ELECTROSURGICAL) ×1 IMPLANT
EVACUATOR SILICONE 100CC (DRAIN) ×4 IMPLANT
GAUZE SPONGE 4X4 12PLY STRL (GAUZE/BANDAGES/DRESSINGS) IMPLANT
GAUZE SPONGE 4X4 12PLY STRL LF (GAUZE/BANDAGES/DRESSINGS) IMPLANT
GLOVE SURG ENC MOIS LTX SZ6 (GLOVE) IMPLANT
GLOVE SURG SS PI 6.0 STRL IVOR (GLOVE) ×6 IMPLANT
GLOVE SURG SYN 7.0 (GLOVE) ×4 IMPLANT
GLOVE SURG UNDER POLY LF SZ7 (GLOVE) ×4 IMPLANT
GOWN STRL REUS W/ TWL LRG LVL3 (GOWN DISPOSABLE) ×2 IMPLANT
GOWN STRL REUS W/TWL LRG LVL3 (GOWN DISPOSABLE) ×4
MARKER SKIN DUAL TIP RULER LAB (MISCELLANEOUS) IMPLANT
NEEDLE HYPO 25X1 1.5 SAFETY (NEEDLE) ×2 IMPLANT
NS IRRIG 1000ML POUR BTL (IV SOLUTION) ×2 IMPLANT
PACK BASIN DAY SURGERY FS (CUSTOM PROCEDURE TRAY) ×2 IMPLANT
PENCIL SMOKE EVACUATOR (MISCELLANEOUS) ×2 IMPLANT
PIN SAFETY STERILE (MISCELLANEOUS) ×2 IMPLANT
SHEET MEDIUM DRAPE 40X70 STRL (DRAPES) ×4 IMPLANT
SLEEVE SCD COMPRESS KNEE MED (MISCELLANEOUS) ×2 IMPLANT
SPONGE LAP 18X18 RF (DISPOSABLE) ×6 IMPLANT
STAPLER VISISTAT 35W (STAPLE) ×2 IMPLANT
STRIP CLOSURE SKIN 1/2X4 (GAUZE/BANDAGES/DRESSINGS) IMPLANT
SUT CHROMIC 5 0 P 3 (SUTURE) ×2 IMPLANT
SUT ETHILON 2 0 FS 18 (SUTURE) ×2 IMPLANT
SUT MNCRL AB 4-0 PS2 18 (SUTURE) ×8 IMPLANT
SUT PDS AB 2-0 CT2 27 (SUTURE) ×2 IMPLANT
SUT VIC AB 3-0 PS1 18 (SUTURE) ×12
SUT VIC AB 3-0 PS1 18XBRD (SUTURE) ×6 IMPLANT
SUT VIC AB 3-0 SH 27 (SUTURE)
SUT VIC AB 3-0 SH 27X BRD (SUTURE) IMPLANT
SUT VICRYL 4-0 PS2 18IN ABS (SUTURE) ×4 IMPLANT
SYR BULB IRRIG 60ML STRL (SYRINGE) IMPLANT
SYR CONTROL 10ML LL (SYRINGE) ×2 IMPLANT
SYR EAR/ULCER 2OZ (SYRINGE) ×2 IMPLANT
TAPE MEASURE VINYL STERILE (MISCELLANEOUS) IMPLANT
TOWEL GREEN STERILE FF (TOWEL DISPOSABLE) ×2 IMPLANT
TUBE CONNECTING 20X1/4 (TUBING) ×2 IMPLANT
UNDERPAD 30X36 HEAVY ABSORB (UNDERPADS AND DIAPERS) ×4 IMPLANT
YANKAUER SUCT BULB TIP NO VENT (SUCTIONS) ×2 IMPLANT

## 2020-09-05 NOTE — Anesthesia Preprocedure Evaluation (Addendum)
Anesthesia Evaluation  Patient identified by MRN, date of birth, ID band Patient awake    Reviewed: Allergy & Precautions, NPO status , Patient's Chart, lab work & pertinent test results, reviewed documented beta blocker date and time   Airway Mallampati: II  TM Distance: >3 FB Neck ROM: Full    Dental no notable dental hx. (+) Chipped,    Pulmonary neg pulmonary ROS,    Pulmonary exam normal breath sounds clear to auscultation       Cardiovascular negative cardio ROS Normal cardiovascular exam Rhythm:Regular Rate:Normal     Neuro/Psych PSYCHIATRIC DISORDERS Anxiety Depression H/o Neurofibromatosis, normal head/lumbar/thoracic MRI 2012 negative neurological ROS     GI/Hepatic negative GI ROS, Neg liver ROS,   Endo/Other  negative endocrine ROS  Renal/GU negative Renal ROS  negative genitourinary   Musculoskeletal negative musculoskeletal ROS (+)   Abdominal   Peds  Hematology negative hematology ROS (+)   Anesthesia Other Findings   Reproductive/Obstetrics                           Anesthesia Physical Anesthesia Plan  ASA: II  Anesthesia Plan: General   Post-op Pain Management:    Induction: Intravenous  PONV Risk Score and Plan: 3 and Midazolam, Dexamethasone and Ondansetron  Airway Management Planned: Oral ETT  Additional Equipment:   Intra-op Plan:   Post-operative Plan: Extubation in OR  Informed Consent: I have reviewed the patients History and Physical, chart, labs and discussed the procedure including the risks, benefits and alternatives for the proposed anesthesia with the patient or authorized representative who has indicated his/her understanding and acceptance.     Dental advisory given  Plan Discussed with: CRNA  Anesthesia Plan Comments:         Anesthesia Quick Evaluation

## 2020-09-05 NOTE — Discharge Instructions (Signed)
May take Tylenol and Ibuprofen after 1:15pm, if needed.   Post Anesthesia Home Care Instructions  Activity: Get plenty of rest for the remainder of the day. A responsible individual must stay with you for 24 hours following the procedure.  For the next 24 hours, DO NOT: -Drive a car -Paediatric nurse -Drink alcoholic beverages -Take any medication unless instructed by your physician -Make any legal decisions or sign important papers.  Meals: Start with liquid foods such as gelatin or soup. Progress to regular foods as tolerated. Avoid greasy, spicy, heavy foods. If nausea and/or vomiting occur, drink only clear liquids until the nausea and/or vomiting subsides. Call your physician if vomiting continues.  Special Instructions/Symptoms: Your throat may feel dry or sore from the anesthesia or the breathing tube placed in your throat during surgery. If this causes discomfort, gargle with warm salt water. The discomfort should disappear within 24 hours.  If you had a scopolamine patch placed behind your ear for the management of post- operative nausea and/or vomiting:  1. The medication in the patch is effective for 72 hours, after which it should be removed.  Wrap patch in a tissue and discard in the trash. Wash hands thoroughly with soap and water. 2. You may remove the patch earlier than 72 hours if you experience unpleasant side effects which may include dry mouth, dizziness or visual disturbances. 3. Avoid touching the patch. Wash your hands with soap and water after contact with the patch.        JP Drain [employer] this sheet to all of your post-operative appointments while you have your drains.  Please measure your drains by CC's or ML's.  Make sure you drain and measure your JP Drains 2 or 3 times per day.  At the end of each day, add up totals for the left side and add up totals for the right side.    ( 9 am )     ( 3 pm )        ( 9 pm )                Date L  R  L  R   L  R  Total L/R

## 2020-09-05 NOTE — Interval H&P Note (Signed)
History and Physical Interval Note:  09/05/2020 6:58 AM  Anne Horton  has presented today for surgery, with the diagnosis of history breast cancer, history therapeutic radiation, post operative asymmetry breasts.  The various methods of treatment have been discussed with the patient and family. After consideration of risks, benefits and other options for treatment, the patient has consented to  Procedure(s): MASTOPEXY (Bilateral) as a surgical intervention.  The patient's history has been reviewed, patient examined, no change in status, stable for surgery.  I have reviewed the patient's chart and labs.  Questions were answered to the patient's satisfaction.     Arnoldo Hooker Rhilyn Battle

## 2020-09-05 NOTE — Addendum Note (Signed)
Addendum  created 09/05/20 1613 by Willa Frater, CRNA   Intraprocedure Event edited

## 2020-09-05 NOTE — Op Note (Signed)
Operative Note   DATE OF OPERATION: 12.21.21  LOCATION: [facility]-outpatient  SURGICAL DIVISION: Plastic Surgery  PREOPERATIVE DIAGNOSES:  1. History breast cancer 2. History therapeutic radiation 3. Lymphedema left breast 4. Post operative asymmetry breasts 5. Neurofibromatosis  POSTOPERATIVE DIAGNOSES:  same  PROCEDURE:  Bilateral breast mastopexy  SURGEON: Irene Limbo MD MBA  ASSISTANT: none  ANESTHESIA:  General.   EBL: 75 ml  COMPLICATIONS: None immediate.   INDICATIONS FOR PROCEDURE:  The patient, Anne Horton, is a 44 y.o. female born on Nov 20, 1975, is here for treatment asymmetry breasts following left lumpectomy with adjuvant radiation. Patient has developed lymphedema left breast with larger volume compared with right.   FINDINGS: Right mastopexy 59.5 g Left 554 g. Left lumpectomy cavity encountered during resection and sent as separate specimen.  DESCRIPTION OF PROCEDURE:  The patient was marked standing in the preoperative area to mark sternal notch, chest midline, anterior axillary lines, inframammary folds. The location of new nipple areolar complex was marked at level of on inframammary fold on anterior surface breast by palpation. This was marked symmetric over bilateral breasts. With aid of Wise pattern marker, location of new nipple areolar complex and vertical limbs (6cm) were markedby displacement of breasts along meridian. The patient was taken to the operating room. SCDs were placedand IV antibiotics were given. The patient's operative site was prepped and draped in a sterile fashion. A time out was performed and all information was confirmed to be correct.   Over left breast, superomedial pedicle marked and nipple areolar complexincisedwith48mm diameter marker. Pedicle deepithlialized and developedto chest wall. Breast tissue resected over lower pole. Medial and lateral flaps developed.Additionalsuperior pole andlateral chest wall  tissue excised.Over lateral breast, encounter hemoclip consistent with prior lumpectomy cavity. This area was sent as a separate specimen. Clips placed within new border lumpectomy cavity. Breast tailor tacked closed.  I then directed attention to right breast where superomedial pedicle designed.NAC marked with65mm diameter marker.The pedicle was deepithelialized. Pedicle developed until tension free rotation was possible.Central lower pole breast tissue maintained on inferior pedicle dermoglandular pedicle. The inferiorly based dermoglandular pedicle was advanced superiorly beneath pedicle as auto augmentation flap and secured to chest wall with 2-0 PDS suture. Breast tailor tacked closed, and patient brought to upright sitting position and assessed for symmetry. Patent returned to supine position. Breast cavities irrigated and hemostasis obtained.Local anestheticinfiltrated throughout each breast. 15 Fr JP placed in each breast and secured with 2-0 nylon. Closure completed bilateralwith 3-0 vicryl to approximate dermis along inframammary fold and vertical limb. NAC inset with 4-0 vicryl in dermis. Skin closure completed with 4-0 monocryl subcuticular throughout.   Over left areola, a single neurofibroma adjacent to the nipple was excised. Simple closure completed with running 5-0 chromic suture.  Tissue adhesive applied. Dry dressing and breast binder applied.The patient was allowed to wake from anesthesia, extubated and taken to the recovery room in satisfactory condition.   SPECIMENS: right mastopexy, left mastopexy, left mastopexy-lumpectomy cavity  DRAINS: 15 Fr JP in right and left breast

## 2020-09-05 NOTE — Anesthesia Procedure Notes (Signed)
Performed by: Francoise Chojnowski D, CRNA       

## 2020-09-05 NOTE — Transfer of Care (Signed)
Immediate Anesthesia Transfer of Care Note  Patient: Anne Horton  Procedure(s) Performed: BILATERAL BREAST MASTOPEXY (Bilateral Breast)  Patient Location: PACU  Anesthesia Type:General  Level of Consciousness: awake, alert , oriented, drowsy and patient cooperative  Airway & Oxygen Therapy: Patient Spontanous Breathing and Patient connected to face mask oxygen  Post-op Assessment: Report given to RN and Post -op Vital signs reviewed and stable  Post vital signs: Reviewed and stable  Last Vitals:  Vitals Value Taken Time  BP    Temp    Pulse    Resp    SpO2      Last Pain:  Vitals:   09/05/20 0647  TempSrc: Oral  PainSc: 0-No pain      Patients Stated Pain Goal: 3 (37/34/28 7681)  Complications: No complications documented.

## 2020-09-05 NOTE — Anesthesia Procedure Notes (Signed)
Procedure Name: Intubation Date/Time: 09/05/2020 7:30 AM Performed by: Willa Frater, CRNA Pre-anesthesia Checklist: Patient identified, Emergency Drugs available, Suction available and Patient being monitored Patient Re-evaluated:Patient Re-evaluated prior to induction Oxygen Delivery Method: Circle system utilized Preoxygenation: Pre-oxygenation with 100% oxygen Induction Type: IV induction Ventilation: Mask ventilation without difficulty Laryngoscope Size: Mac and 3 Grade View: Grade I Tube type: Oral Tube size: 7.0 mm Number of attempts: 1 Airway Equipment and Method: Stylet and Oral airway Placement Confirmation: ETT inserted through vocal cords under direct vision,  positive ETCO2 and breath sounds checked- equal and bilateral Tube secured with: Tape Dental Injury: Teeth and Oropharynx as per pre-operative assessment

## 2020-09-05 NOTE — Anesthesia Postprocedure Evaluation (Signed)
Anesthesia Post Note  Patient: Anne Horton  Procedure(s) Performed: BILATERAL BREAST MASTOPEXY (Bilateral Breast)     Patient location during evaluation: PACU Anesthesia Type: General Level of consciousness: awake and alert Pain management: pain level controlled Vital Signs Assessment: post-procedure vital signs reviewed and stable Respiratory status: spontaneous breathing, nonlabored ventilation, respiratory function stable and patient connected to nasal cannula oxygen Cardiovascular status: blood pressure returned to baseline and stable Postop Assessment: no apparent nausea or vomiting Anesthetic complications: no   No complications documented.  Last Vitals:  Vitals:   09/05/20 1215 09/05/20 1315  BP: 131/72 128/67  Pulse: (!) 103 (!) 102  Resp: 14 16  Temp:  37.1 C  SpO2: 94% 99%    Last Pain:  Vitals:   09/05/20 1315  TempSrc:   PainSc: 4                  Durinda Buzzelli L Aubert Choyce

## 2020-09-06 ENCOUNTER — Encounter (HOSPITAL_BASED_OUTPATIENT_CLINIC_OR_DEPARTMENT_OTHER): Payer: Self-pay | Admitting: Plastic Surgery

## 2020-09-06 LAB — SURGICAL PATHOLOGY

## 2020-09-16 DIAGNOSIS — C801 Malignant (primary) neoplasm, unspecified: Secondary | ICD-10-CM

## 2020-09-16 HISTORY — DX: Malignant (primary) neoplasm, unspecified: C80.1

## 2020-09-21 ENCOUNTER — Ambulatory Visit
Admission: RE | Admit: 2020-09-21 | Discharge: 2020-09-21 | Disposition: A | Discharge status: Home / Self Care | Payer: 59 | Source: Ambulatory Visit | Attending: Radiation Oncology | Admitting: Radiation Oncology

## 2020-09-21 NOTE — Progress Notes (Incomplete)
Radiation Oncology         [phone] ________________________________  Name: Anne Horton [facility] MRN: [id number]  Date: 08/02/2019  DOB: 1976/06/01  Follow-Up Visit Note  CC: Griffin Dakin, MD  Diagnosis: Stage I invasive ductal carcinoma of the left breast (pT1c, pN0)  Interval Since Last Radiation: Two years, one month, one week, and five days  Radiation treatment dates: 06/24/2018 to 08/10/2018  Site/dose:    1. The Left breast was treated to 50.4 Gy in 28 fractions of 1.8 Gy. 2. The Left breast was boosted to 10 Gy in 5 fractions of 2 Gy.  Narrative:  The patient returns today for routine follow-up. Since her last visit, she began to have some tenderness in the left breast. This was examined by Dr. Pamelia Hoit, who noted some mild to moderate left breast lymphedema with tenderness at the 12 o'clock position as well as in the upper outer quadrant. However, he did not palpate any lumps or nodules and the breast contour was normal. He recommended close monitoring with mammogram and ultrasound if symptoms persisted.  The patient underwent a bilateral breast mastopexy on 09/05/2020 under the care of Dr. Leta Baptist. Pathology from the procedure revealed benign breast tissue with fibrocystic and fibroadenomatoid change of the left breast, fibrocystic change and benign breast tissue with dense fibrosis consistent with prior resection of the left lumpectomy cavity, and benign breat tissue with fibrocystic change of the right breast.  On review of systems, she reports ***. She denies ***.  ALLERGIES:  is allergic to latex.  Meds: Current Outpatient Medications  Medication Sig Dispense Refill  . acetaminophen (TYLENOL) 500 MG tablet Take 500 mg by mouth every 6 (six) hours as needed.    . ALPRAZolam (XANAX) 0.5 MG tablet Take 0.5 mg by mouth at bedtime as needed (FOR ANXIETY). :take 1/2 to 1 twice a day as needed    . cetirizine (ZYRTEC) 10 MG tablet Take 10 mg by  mouth as needed for allergies.     Marland Kitchen ibuprofen (ADVIL,MOTRIN) 200 MG tablet Take 800 mg by mouth daily as needed for headache.    . metoprolol succinate (TOPROL-XL) 50 MG 24 hr tablet TAKE 1 TABLET BY MOUTH DAILY. TAKE WITH OR IMMEDIATELY FOLLOWING A MEAL. 90 tablet 3  . propranolol (INDERAL) 10 MG tablet TAKE 1 TABLET BY MOUTH FOUR TIMES DAILY AS NEEDED FOR PALPITATIONS 120 tablet 3  . tamoxifen (NOLVADEX) 20 MG tablet Take 1 tablet (20 mg total) by mouth daily. 90 tablet 3  . venlafaxine XR (EFFEXOR-XR) 150 MG 24 hr capsule Take 150 mg by mouth daily with breakfast.     No current facility-administered medications for this encounter.     Physical Findings: The patient is in no acute distress. Patient is alert and oriented.  vitals were not taken for this visit.   Lungs are clear to auscultation bilaterally. Heart has regular rate and rhythm. No palpable cervical, supraclavicular, or axillary adenopathy. Abdomen soft, non-tender, normal bowel sounds.   Breast Exam Right Breast: Large and pendulous without mass, nipple discharge or bleeding. Left Breast: Large and pendulous without mass nipple discharge or bleeding. ***  Lab Findings: Lab Results  Component Value Date   WBC 7.3 08/03/2018   HGB 13.2 08/03/2018   HCT 42.1 08/03/2018   MCV 84.0 08/03/2018   PLT 404 (H) 08/03/2018    Radiographic Findings: No results found.  Impression: Stage I invasive ductal carcinoma of the left breast (pT1c, pN0)  No evidence of recurrence on clinical exam today. ***  Plan:  The patent will continue on Tamoxifen. She is scheduled to see Dr. Lindi Adie on 10/31/2020. Given that she has been doing well, she may follow up with radiation oncology on an as-needed basis. Advised continued close follow-up with medical oncology and surgery.   Total time spent in this encounter was *** minutes which included reviewing the patient's most recent follow-ups, bilateral breast mastopexy, pathology report,  physical examination, and documentation.  ____________________________________  Blair Promise, PhD, MD  This document serves as a record of services personally performed by [name], MD. It was created on his behalf by Clerance Lav, a trained medical scribe. The creation of this record is based on the scribe's personal observations and the provider's statements to them. This document has been checked and approved by the attending provider.

## 2020-10-04 NOTE — Progress Notes (Signed)
Radiation Oncology         [phone] ________________________________  Name: Anne Horton [facility] MRN: [id number]  Date: 08/02/2019  DOB: 03-Oct-1975  Follow-Up Visit Note  CC: [name], MD  Diagnosis: Stage I invasive ductal carcinoma of the left breast (pT1c, pN0)  Interval Since Last Radiation: Two years, one month, three weeks, and five days  Radiation treatment dates: 06/24/2018 to 08/10/2018  Site/dose:    1. The Left breast was treated to 50.4 Gy in 28 fractions of 1.8 Gy. 2. The Left breast was boosted to 10 Gy in 5 fractions of 2 Gy.  Narrative:  The patient returns today for routine follow-up. Since her last visit, she began to have some tenderness in the left breast. This was examined by Dr. Lindi Adie, who noted some mild to moderate left breast lymphedema with tenderness at the 12 o'clock position as well as in the upper outer quadrant. However, he did not palpate any lumps or nodules and the breast contour was normal. He recommended close monitoring with mammogram and ultrasound if symptoms persisted.  The patient underwent a bilateral breast mastopexy on 09/05/2020 under the care of Dr. Iran Planas. Pathology from the procedure revealed benign breast tissue with fibrocystic and fibroadenomatoid change of the left breast, fibrocystic change and benign breast tissue with dense fibrosis consistent with prior resection of the left lumpectomy cavity, and benign breat tissue with fibrocystic change of the right breast.  On review of systems, she reports minimal discomfort within both breast areas but no pain. She denies any infection after her recent surgery.  She is very pleased with her surgical result at this time.  She denies any problems with swelling in her left arm or hand.  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  ALLERGIES:  is allergic to latex.  Meds: Current Outpatient Medications  Medication Sig Dispense Refill  .  acetaminophen (TYLENOL) 500 MG tablet Take 500 mg by mouth every 6 (six) hours as needed.    . ALPRAZolam (XANAX) 0.5 MG tablet Take 0.5 mg by mouth at bedtime as needed (FOR ANXIETY). :take 1/2 to 1 twice a day as needed    . cetirizine (ZYRTEC) 10 MG tablet Take 10 mg by mouth as needed for allergies.     Marland Kitchen ibuprofen (ADVIL,MOTRIN) 200 MG tablet Take 800 mg by mouth daily as needed for headache.    . metoprolol succinate (TOPROL-XL) 50 MG 24 hr tablet TAKE 1 TABLET BY MOUTH DAILY. TAKE WITH OR IMMEDIATELY FOLLOWING A MEAL. 90 tablet 3  . propranolol (INDERAL) 10 MG tablet TAKE 1 TABLET BY MOUTH FOUR TIMES DAILY AS NEEDED FOR PALPITATIONS 120 tablet 3  . tamoxifen (NOLVADEX) 20 MG tablet Take 1 tablet (20 mg total) by mouth daily. 90 tablet 3  . venlafaxine XR (EFFEXOR-XR) 150 MG 24 hr capsule Take 150 mg by mouth daily with breakfast.     No current facility-administered medications for this encounter.     Physical Findings: The patient is in no acute distress. Patient is alert and oriented.   Lungs are clear to auscultation bilaterally. Heart has regular rate and rhythm. No palpable cervical, supraclavicular, or axillary adenopathy. Abdomen soft, non-tender, normal bowel sounds.   Breast Exam Right Breast: Mastopexy scar noted.  No signs of infection in the breast.  Approximately 1 cm area remains to heal along the inferior aspect of the breast Left Breast: Mastopexy scar noted..  Scars are healed well at this time  no signs of infection within the breast.  Overall excellent cosmetic result with good breast symmetry.  Lab Findings: Lab Results  Component Value Date   WBC 7.3 08/03/2018   HGB 13.2 08/03/2018   HCT 42.1 08/03/2018   MCV 84.0 08/03/2018   PLT 404 (H) 08/03/2018    Radiographic Findings: No results found.  Impression: Stage I invasive ductal carcinoma of the left breast (pT1c, pN0)  No evidence of recurrence on clinical exam today.  The patient suffered with severe  lymphedema of the left breast after her breast radiation therapy which prompted the surgery as above.  This surgery appears to be very successful with good wound healing at this time.  No signs of malignancy from tissue removed from the left breast.  Plan:  The patent will continue on Tamoxifen. She is scheduled to see Dr. Lindi Adie on 10/31/2020.  The patient will follow-up in 6 months in radiation oncology.  If she continues to do well at that point we will likely release her to follow-up with surgery and medical oncology.    ____________________________________  Blair Promise, PhD, MD  This document serves as a record of services personally performed by [name], MD. It was created on his behalf by Clerance Lav, a trained medical scribe. The creation of this record is based on the scribe's personal observations and the provider's statements to them. This document has been checked and approved by the attending provider.

## 2020-10-05 ENCOUNTER — Ambulatory Visit
Admission: RE | Admit: 2020-10-05 | Discharge: 2020-10-05 | Disposition: A | Discharge status: Home / Self Care | Payer: 59 | Source: Ambulatory Visit | Attending: Radiation Oncology | Admitting: Radiation Oncology

## 2020-10-05 ENCOUNTER — Other Ambulatory Visit: Payer: Self-pay

## 2020-10-05 ENCOUNTER — Encounter: Payer: Self-pay | Admitting: Radiation Oncology

## 2020-10-05 DIAGNOSIS — Z08 Encounter for follow-up examination after completed treatment for malignant neoplasm: Secondary | ICD-10-CM | POA: Diagnosis not present

## 2020-10-05 DIAGNOSIS — Z79810 Long term (current) use of selective estrogen receptor modulators (SERMs): Secondary | ICD-10-CM | POA: Diagnosis not present

## 2020-10-05 DIAGNOSIS — Z923 Personal history of irradiation: Secondary | ICD-10-CM | POA: Insufficient documentation

## 2020-10-05 DIAGNOSIS — C50412 Malignant neoplasm of upper-outer quadrant of left female breast: Secondary | ICD-10-CM | POA: Insufficient documentation

## 2020-10-05 DIAGNOSIS — I890 Lymphedema, not elsewhere classified: Secondary | ICD-10-CM | POA: Insufficient documentation

## 2020-10-05 DIAGNOSIS — Z79899 Other long term (current) drug therapy: Secondary | ICD-10-CM | POA: Diagnosis not present

## 2020-10-05 DIAGNOSIS — Z170 Estrogen receptor positive status [ER+]: Secondary | ICD-10-CM | POA: Diagnosis not present

## 2020-10-05 NOTE — Progress Notes (Signed)
Patient is here today for follow up from radiation to left breast that was completed Nov 2019.  Patient denies having any pain or discomfort/limitations of movement or swelling in the extremity. Patient had maxoplexy completed on 09/13/2021 by Dr. Iran Planas. Excess fluid was found and removed. Reports wound is healing well and had follow up with Dr Iran Planas yesterday.  Patient reports no issues with appetite and energy level has returned to normal.  Reports no issues with skin post radiation.  Vitals:   10/05/20 0823  BP: 117/71  Pulse: 97  Resp: 18  Temp: (!) 96.9 F (36.1 C)  TempSrc: Temporal  SpO2: 99%  Weight: 145 lb (65.8 kg)  Height: 5\' 2"  (1.575 m)

## 2020-10-30 ENCOUNTER — Telehealth: Payer: Self-pay | Admitting: Hematology and Oncology

## 2020-10-30 NOTE — Progress Notes (Incomplete)
HEMATOLOGY-ONCOLOGY MYCHART VIDEO VISIT PROGRESS NOTE  I connected with Anne Horton on 10/30/2020 at  2:15 PM EST by MyChart video conference and verified that I am speaking with the correct person using two identifiers.  I discussed the limitations, risks, security and privacy concerns of performing an evaluation and management service by MyChart and the availability of in person appointments.  I also discussed with the patient that there may be a patient responsible charge related to this service. The patient expressed understanding and agreed to proceed.  Patient's Location: Home Physician Location: Clinic  CHIEF COMPLIANT: Follow-up of left breast cancer on tamoxifen   INTERVAL HISTORY: Anne Horton is a 45 y.o. female with above-mentioned history of left breast cancer treated with lumpectomy, radiation, and who is currently on antiestrogen therapy with anastrozole. Mammogram on 03/15/20 showed no evidence of malignancy bilaterally. She presents today for annual follow-up.   Oncology History  Malignant neoplasm of upper-outer quadrant of left breast in female, estrogen receptor positive ([place])  03/20/2018 Initial Diagnosis   Screening detected left breast spiculated mass by ultrasound measured 1.2 cm.  Biopsy revealed invasive ductal carcinoma grade 1-2 with high-grade DCIS, ER 95%, PR 95%, Ki-67 1%, HER-2 negative ratio 1.47, T1CN0 stage I a clinical stage   03/20/2018 Cancer Staging   Staging form: Breast, AJCC 8th Edition - Clinical stage from 03/20/2018: Stage IA (cT1c, cN0, cM0, G2, ER+, PR+, HER2-) - Signed by Gardenia Phlegm, NP on 05/27/2018   04/08/2018 Genetic Testing   VUS in NF1, otherwise negative.  Genes tested: APC, ATM, AXIN2, BARD1, BMPR1A, BRCA1, BRCA2, BRIP1, CDH1, CDKN2A (p14ARF), CDKN2A (p16INK4a), CKD4, CHEK2, CTNNA1, DICER1, EPCAM (Deletion/duplication testing only), GREM1 (promoter region deletion/duplication testing only), KIT, MEN1, MLH1, MSH2, MSH3,  MSH6, MUTYH, NBN, NF1, NHTL1, PALB2, PDGFRA, PMS2, POLD1, POLE, PTEN, RAD50, RAD51C, RAD51D, SDHB, SDHC, SDHD, SMAD4, SMARCA4. STK11, TP53, TSC1, TSC2, and VHL.  The following genes were evaluated for sequence changes only: SDHA and HOXB13 c.251G>A variant only.   05/07/2018 Surgery   Left lumpectomy: IDC grade 1, 1.2 cm, intermediate grade DCIS, margins negative,PASH, 0/7 sentinel lymph nodes negative, ER 95%, PR 95%, Ki-67 1%, HER-2 negative ratio 1.47, T1CN0 stage Ia   05/07/2018 Cancer Staging   Staging form: Breast, AJCC 8th Edition - Pathologic stage from 05/07/2018: Stage IA (pT1c, pN0, cM0, G1, ER+, PR+, HER2-, Oncotype DX score: 11) - Signed by Gardenia Phlegm, NP on 05/27/2018   05/25/2018 Oncotype testing   Oncotype DX score 11: 3% risk of distant recurrence of 9 years, low risk   06/24/2018 - 08/03/2018 Radiation Therapy   Adjuvant radiation therapy   09/2018 -  Anti-estrogen oral therapy   Tamoxifen daily     Observations/Objective:  There were no vitals filed for this visit. There is no height or weight on file to calculate BMI.  I have reviewed the data as listed CMP Latest Ref Rng & Units 08/03/2018 03/24/2018 12/29/2014  Glucose 70 - 99 mg/dL 97 87 87  BUN 6 - 20 mg/dL _0 Creatinine 0.44 - 1.00 mg/dL 0.74 0.74 0.61  Sodium 135 - 145 mmol/L 142 138 138  Potassium 3.5 - 5.1 mmol/L 4.0 4.7 4.4  Chloride 98 - 111 mmol/L 108 102 103  CO2 22 - 32 mmol/L _1 Calcium 8.9 - 10.3 mg/dL 9.0 9.4 8.9  Total Protein 6.5 - 8.1 g/dL 6.8 7.1 6.5  Total Bilirubin 0.3 - 1.2 mg/dL 0.4 0.3 0.3  Alkaline Phos  38 - 126 U/L 61 71 42  AST 15 - 41 U/L 13(L) 21 19  ALT 0 - 44 U/L _0 Lab Results  Component Value Date   WBC 7.3 08/03/2018   HGB 13.2 08/03/2018   HCT 42.1 08/03/2018   MCV 84.0 08/03/2018   PLT 404 (H) 08/03/2018   NEUTROABS 5.3 08/03/2018      Assessment Plan:  No problem-specific Assessment & Plan notes found for this encounter.    I  discussed the assessment and treatment plan with the patient. The patient was provided an opportunity to ask questions and all were answered. The patient agreed with the plan and demonstrated an understanding of the instructions. The patient was advised to call back or seek an in-person evaluation if the symptoms worsen or if the condition fails to improve as anticipated.   Total time spent: *** minutes including face-to-face MyChart video visit time and time spent for planning, charting and coordination of care  Rulon Eisenmenger, MD 10/30/2020   I, Cloyde Reams Dorshimer, am acting as scribe for Nicholas Lose, MD.  {insert scribe attestation}

## 2020-10-30 NOTE — Assessment & Plan Note (Deleted)
05/07/2018:Leftlumpectomy: IDC grade 1, 1.2 cm, intermediate grade DCIS, margins negative,PASH, 0/7 sentinel lymph nodes negative, ER 95%, PR 95%, Ki-67 1%, HER-2 negative ratio 1.47, T1CN0 stage Ia Oncotype DX score 11: 3% risk of distant recurrence, low risk Adjuvant radiation therapy completed 08/03/2018  Current treatment:Adjuvant antiestrogen therapy with tamoxifen 20 mg daily x10 years started 09/10/2018 Tamoxifen toxicities: Mild intermittent hot flashes  Breast cancer surveillance: 1.  Breast exam 11/03/2019: Benign 2.  Mammogram 03/15/2020: Benign, breast density category C  Left breast tenderness and lymphedema: I prescribed her gabapentin to be taken at bedtime on an as-needed basis. She will continue to use lymphedema treatments that she has with [facility]  Return to clinic in 1 year for follow-up

## 2020-10-30 NOTE — Telephone Encounter (Signed)
Changed 2/15 appt to mychart called and spoke with pt, confirmed appt

## 2020-10-31 ENCOUNTER — Inpatient Hospital Stay: Payer: 59 | Admitting: Hematology and Oncology

## 2020-10-31 DIAGNOSIS — C50412 Malignant neoplasm of upper-outer quadrant of left female breast: Secondary | ICD-10-CM

## 2020-11-01 ENCOUNTER — Ambulatory Visit: Payer: 59 | Admitting: Hematology and Oncology

## 2020-11-02 ENCOUNTER — Ambulatory Visit: Payer: 59 | Admitting: Hematology and Oncology

## 2020-11-11 DIAGNOSIS — E782 Mixed hyperlipidemia: Secondary | ICD-10-CM | POA: Diagnosis not present

## 2020-11-11 DIAGNOSIS — Z0000 Encounter for general adult medical examination without abnormal findings: Secondary | ICD-10-CM | POA: Diagnosis not present

## 2020-11-11 DIAGNOSIS — D509 Iron deficiency anemia, unspecified: Secondary | ICD-10-CM | POA: Diagnosis not present

## 2020-11-11 DIAGNOSIS — Z79899 Other long term (current) drug therapy: Secondary | ICD-10-CM | POA: Diagnosis not present

## 2020-11-11 DIAGNOSIS — D510 Vitamin B12 deficiency anemia due to intrinsic factor deficiency: Secondary | ICD-10-CM | POA: Diagnosis not present

## 2020-11-14 ENCOUNTER — Other Ambulatory Visit: Payer: Self-pay | Admitting: *Deleted

## 2020-11-14 ENCOUNTER — Encounter: Payer: Self-pay | Admitting: *Deleted

## 2020-11-14 ENCOUNTER — Other Ambulatory Visit: Payer: Self-pay | Admitting: Hematology and Oncology

## 2020-11-14 DIAGNOSIS — D508 Other iron deficiency anemias: Secondary | ICD-10-CM

## 2020-11-14 NOTE — Progress Notes (Signed)
Received lab work from pt PCP Dr. Synetta Shadow PA-C with the following results.  Transferrin- 330 Iron 30 Ferritin 13 TIBC 462 UIBC 388 % saturation- 6%  Results reviewed with MD who states pt needing IV Venofer 300 mg weekly x3.  Message sent to scheduling and lab results placed in HIM folder to be scanned into pt chart.

## 2020-11-14 NOTE — Progress Notes (Signed)
Iron studies reveal ferritin of 8 and iron saturation of 6%.  This was work-up done at [facility]. We discussed different options and we selected intravenous iron therapy with Venofer. She attributes the cause of anemia from her recent surgery.

## 2020-11-17 DIAGNOSIS — F330 Major depressive disorder, recurrent, mild: Secondary | ICD-10-CM | POA: Diagnosis not present

## 2020-11-17 DIAGNOSIS — F411 Generalized anxiety disorder: Secondary | ICD-10-CM | POA: Diagnosis not present

## 2020-11-17 DIAGNOSIS — Q8500 Neurofibromatosis, unspecified: Secondary | ICD-10-CM | POA: Diagnosis not present

## 2020-11-17 DIAGNOSIS — E782 Mixed hyperlipidemia: Secondary | ICD-10-CM | POA: Diagnosis not present

## 2020-11-17 DIAGNOSIS — Z0000 Encounter for general adult medical examination without abnormal findings: Secondary | ICD-10-CM | POA: Diagnosis not present

## 2020-11-17 DIAGNOSIS — J309 Allergic rhinitis, unspecified: Secondary | ICD-10-CM | POA: Diagnosis not present

## 2020-11-17 DIAGNOSIS — Z170 Estrogen receptor positive status [ER+]: Secondary | ICD-10-CM | POA: Diagnosis not present

## 2020-11-17 DIAGNOSIS — D509 Iron deficiency anemia, unspecified: Secondary | ICD-10-CM | POA: Diagnosis not present

## 2020-11-17 DIAGNOSIS — C50412 Malignant neoplasm of upper-outer quadrant of left female breast: Secondary | ICD-10-CM | POA: Diagnosis not present

## 2020-11-20 MED FILL — METOPROLOL SUCCINATE ER 50: 50 | 90 days supply | Qty: 90 | Fill #1

## 2020-11-21 ENCOUNTER — Inpatient Hospital Stay (HOSPITAL_BASED_OUTPATIENT_CLINIC_OR_DEPARTMENT_OTHER): Payer: 59 | Admitting: Medical

## 2020-11-21 ENCOUNTER — Other Ambulatory Visit: Payer: Self-pay

## 2020-11-21 ENCOUNTER — Inpatient Hospital Stay: Payer: 59 | Attending: Radiation Oncology

## 2020-11-21 VITALS — BP 108/53 | HR 89 | Temp 98.9°F | Resp 18

## 2020-11-21 DIAGNOSIS — Z79899 Other long term (current) drug therapy: Secondary | ICD-10-CM | POA: Diagnosis not present

## 2020-11-21 DIAGNOSIS — T8090XA Unspecified complication following infusion and therapeutic injection, initial encounter: Secondary | ICD-10-CM

## 2020-11-21 DIAGNOSIS — D508 Other iron deficiency anemias: Secondary | ICD-10-CM

## 2020-11-21 DIAGNOSIS — D509 Iron deficiency anemia, unspecified: Secondary | ICD-10-CM | POA: Diagnosis not present

## 2020-11-21 DIAGNOSIS — R072 Precordial pain: Secondary | ICD-10-CM | POA: Diagnosis not present

## 2020-11-21 MED ORDER — FAMOTIDINE IN NACL 20-0.9 MG/50ML-% IV SOLN: 20.0000 mg | Freq: Once | INTRAVENOUS | Status: DC | PRN

## 2020-11-21 MED ORDER — ACETAMINOPHEN 500 MG PO TABS
ORAL_TABLET | ORAL | Status: AC
  Filled 2020-11-21: qty 2

## 2020-11-21 MED ORDER — SODIUM CHLORIDE 0.9 % IV SOLN
300.0000 mg | Freq: Once | INTRAVENOUS | Status: AC
  Administered 2020-11-21: 300 mg via INTRAVENOUS
  Filled 2020-11-21 (×2): qty 15

## 2020-11-21 MED ORDER — ACETAMINOPHEN 500 MG PO TABS
1000.0000 mg | ORAL_TABLET | Freq: Once | ORAL | Status: AC
  Administered 2020-11-21: 1000 mg via ORAL

## 2020-11-21 MED ORDER — SODIUM CHLORIDE 0.9 % IV SOLN
Freq: Once | INTRAVENOUS | Status: AC
  Filled 2020-11-21: qty 250

## 2020-11-21 MED ORDER — FAMOTIDINE IN NACL 20-0.9 MG/50ML-% IV SOLN
INTRAVENOUS | Status: AC
  Filled 2020-11-21: qty 50

## 2020-11-21 NOTE — Patient Instructions (Signed)
Iron Dextran injection °What is this medicine? °IRON DEXTRAN (AHY ern DEX tran) is an iron complex. Iron is used to make healthy red blood cells, which carry oxygen and nutrients through the body. This medicine is used to treat people who cannot take iron by mouth and have low levels of iron in the blood. °This medicine may be used for other purposes; ask your health care provider or pharmacist if you have questions. °COMMON BRAND NAME(S): Dexferrum, INFeD °What should I tell my health care provider before I take this medicine? °They need to know if you have any of these conditions: °· anemia not caused by low iron levels °· heart disease °· high levels of iron in the blood °· kidney disease °· liver disease °· an unusual or allergic reaction to iron, other medicines, foods, dyes, or preservatives °· pregnant or trying to get pregnant °· breast-feeding °How should I use this medicine? °This medicine is for injection into a vein or a muscle. It is given by a health care professional in a hospital or clinic setting. °Talk to your pediatrician regarding the use of this medicine in children. While this drug may be prescribed for children as young as 4 months old for selected conditions, precautions do apply. °Overdosage: If you think you have taken too much of this medicine contact a poison control center or emergency room at once. °NOTE: This medicine is only for you. Do not share this medicine with others. °What if I miss a dose? °It is important not to miss your dose. Call your doctor or health care professional if you are unable to keep an appointment. °What may interact with this medicine? °Do not take this medicine with any of the following medications: °· deferoxamine °· dimercaprol °· other iron products °This medicine may also interact with the following medications: °· chloramphenicol °· deferasirox °This list may not describe all possible interactions. Give your health care provider a list of all the  medicines, herbs, non-prescription drugs, or dietary supplements you use. Also tell them if you smoke, drink alcohol, or use illegal drugs. Some items may interact with your medicine. °What should I watch for while using this medicine? °Visit your doctor or health care professional regularly. Tell your doctor if your symptoms do not start to get better or if they get worse. You may need blood work done while you are taking this medicine. °You may need to follow a special diet. Talk to your doctor. Foods that contain iron include: whole grains/cereals, dried fruits, beans, or peas, leafy green vegetables, and organ meats (liver, kidney). °Long-term use of this medicine may increase your risk of some cancers. Talk to your doctor about how to limit your risk. °What side effects may I notice from receiving this medicine? °Side effects that you should report to your doctor or health care professional as soon as possible: °· allergic reactions like skin rash, itching or hives, swelling of the face, lips, or tongue °· blue lips, nails, or skin °· breathing problems °· changes in blood pressure °· chest pain °· confusion °· fast, irregular heartbeat °· feeling faint or lightheaded, falls °· fever or chills °· flushing, sweating, or hot feelings °· joint or muscle aches or pains °· pain, tingling, numbness in the hands or feet °· seizures °· unusually weak or tired °Side effects that usually do not require medical attention (report to your doctor or health care professional if they continue or are bothersome): °· change in taste (metallic taste) °·   diarrhea °· headache °· irritation at site where injected °· nausea, vomiting °· stomach upset °This list may not describe all possible side effects. Call your doctor for medical advice about side effects. You may report side effects to FDA at 1-800-FDA-1088. °Where should I keep my medicine? °This drug is given in a hospital or clinic and will not be stored at home. °NOTE: This  sheet is a summary. It may not cover all possible information. If you have questions about this medicine, talk to your doctor, pharmacist, or health care provider. °© 2021 [employer]/Gold Standard (2008-01-19 16:59:50) ° °

## 2020-11-21 NOTE — Progress Notes (Signed)
Orders received to add the following premedications prior to Venofer:  Pepcid 20 mg IVPB x 1  Dexamethasone 10 mg IVPB x 1 Tylenol 1000 mg orally x 1  Plan updated to reflect above.  T.O. Dr Jeannene Patella, PharmD 11/21/20 @ [phone]

## 2020-11-21 NOTE — Progress Notes (Signed)
At end of 30-min wait post iron infusion, patient c/o chest pain 7/10 radiating to back. Sandi Mealy, PA notified. EKG and VS taken. Tylenol 1,000mg  and Pepcid 20mg  given. After 30 minutes, patient states she feels better with pain 3/10 and discharged in stable condition.

## 2020-11-22 NOTE — Progress Notes (Signed)
    DATE:  11/21/2020                                          X    IRON INFUSION    MD:  Dr. Nicholas Lose    AGENT/BLOOD PRODUCT RECEIVING TODAY:               Iron dextran   AGENT/BLOOD PRODUCT RECEIVING IMMEDIATELY PRIOR TO REACTION:           Iron dextran   VS: BP:      109/95   P:        110       SPO2:        100% on room air                BP:      115/67   P:        92         SPO2:        100% on room air     REACTION(S):           Substernal chest pain with radiation to the left at the end of a 30-minute wait after receiving iron dextran.   PREMEDS:       None   INTERVENTION: Patient was given Tylenol 1000 mg p.o. x1 and Pepcid 20 mg IV x1.  An EKG was completed which returned showing a normal sinus rhythm at 89 bpm with no evidence of ischemia or acute changes.   Review of Systems  Review of Systems  Constitutional: Negative for chills, diaphoresis and fever.  HENT: Negative for trouble swallowing and voice change.   Respiratory: Negative for cough, chest tightness, shortness of breath and wheezing.   Cardiovascular: Positive for chest pain. Negative for palpitations.  Gastrointestinal: Negative for abdominal pain, constipation, diarrhea, nausea and vomiting.  Musculoskeletal: Negative for back pain and myalgias.  Neurological: Negative for dizziness, light-headedness and headaches.     Physical Exam  Physical Exam Constitutional:      General: She is not in acute distress.    Appearance: She is not diaphoretic.  HENT:     Head: Normocephalic and atraumatic.  Cardiovascular:     Rate and Rhythm: Normal rate and regular rhythm.     Heart sounds: Normal heart sounds. No murmur heard. No friction rub. No gallop.   Pulmonary:     Effort: Pulmonary effort is normal. No respiratory distress.     Breath sounds: Normal breath sounds. No wheezing or rales.  Skin:    General: Skin is warm and dry.     Findings: No erythema or rash.  Neurological:     Mental Status:  She is alert.     OUTCOME:                 The patient's symptoms abated after she was given Tylenol 1000 mg p.o. x1 and Pepcid 20 mg IV x1.  She was watched for approximately 15 minutes after this medication was given and then was released from the infusion area.   Sandi Mealy, MHS, PA-C

## 2020-11-28 ENCOUNTER — Ambulatory Visit: Payer: 59

## 2020-11-30 ENCOUNTER — Other Ambulatory Visit: Payer: Self-pay

## 2020-11-30 ENCOUNTER — Inpatient Hospital Stay: Payer: 59

## 2020-11-30 VITALS — BP 126/54 | HR 84 | Temp 98.9°F | Resp 15

## 2020-11-30 DIAGNOSIS — R072 Precordial pain: Secondary | ICD-10-CM | POA: Diagnosis not present

## 2020-11-30 DIAGNOSIS — D508 Other iron deficiency anemias: Secondary | ICD-10-CM

## 2020-11-30 DIAGNOSIS — D509 Iron deficiency anemia, unspecified: Secondary | ICD-10-CM | POA: Diagnosis not present

## 2020-11-30 DIAGNOSIS — Z79899 Other long term (current) drug therapy: Secondary | ICD-10-CM | POA: Diagnosis not present

## 2020-11-30 MED ORDER — ACETAMINOPHEN 325 MG PO TABS
ORAL_TABLET | ORAL | Status: AC
  Filled 2020-11-30: qty 2

## 2020-11-30 MED ORDER — FAMOTIDINE IN NACL 20-0.9 MG/50ML-% IV SOLN
20.0000 mg | Freq: Once | INTRAVENOUS | Status: AC
Start: 2020-11-30 — End: 2020-11-30
  Administered 2020-11-30: 20 mg via INTRAVENOUS

## 2020-11-30 MED ORDER — FAMOTIDINE IN NACL 20-0.9 MG/50ML-% IV SOLN
INTRAVENOUS | Status: AC
  Filled 2020-11-30: qty 50

## 2020-11-30 MED ORDER — SODIUM CHLORIDE 0.9 % IV SOLN
10.0000 mg | Freq: Once | INTRAVENOUS | Status: AC
  Administered 2020-11-30: 10 mg via INTRAVENOUS
  Filled 2020-11-30: qty 10

## 2020-11-30 MED ORDER — SODIUM CHLORIDE 0.9 % IV SOLN
Freq: Once | INTRAVENOUS | Status: AC
  Filled 2020-11-30: qty 250

## 2020-11-30 MED ORDER — ACETAMINOPHEN 500 MG PO TABS
1000.0000 mg | ORAL_TABLET | Freq: Once | ORAL | Status: AC
Start: 2020-11-30 — End: 2020-11-30
  Administered 2020-11-30: 650 mg via ORAL

## 2020-11-30 MED ORDER — SODIUM CHLORIDE 0.9 % IV SOLN
300.0000 mg | Freq: Once | INTRAVENOUS | Status: AC
  Administered 2020-11-30: 300 mg via INTRAVENOUS
  Filled 2020-11-30: qty 10

## 2020-11-30 NOTE — Patient Instructions (Signed)

## 2020-12-05 ENCOUNTER — Ambulatory Visit: Payer: 59

## 2020-12-07 ENCOUNTER — Inpatient Hospital Stay: Payer: 59

## 2020-12-12 ENCOUNTER — Other Ambulatory Visit: Payer: Self-pay

## 2020-12-12 ENCOUNTER — Inpatient Hospital Stay: Payer: 59

## 2020-12-12 VITALS — BP 116/54 | HR 86 | Temp 97.8°F | Resp 16

## 2020-12-12 DIAGNOSIS — Z79899 Other long term (current) drug therapy: Secondary | ICD-10-CM | POA: Diagnosis not present

## 2020-12-12 DIAGNOSIS — D508 Other iron deficiency anemias: Secondary | ICD-10-CM

## 2020-12-12 DIAGNOSIS — R072 Precordial pain: Secondary | ICD-10-CM | POA: Diagnosis not present

## 2020-12-12 DIAGNOSIS — D509 Iron deficiency anemia, unspecified: Secondary | ICD-10-CM | POA: Diagnosis not present

## 2020-12-12 MED ORDER — FAMOTIDINE IN NACL 20-0.9 MG/50ML-% IV SOLN
20.0000 mg | Freq: Once | INTRAVENOUS | Status: AC
Start: 2020-12-12 — End: 2020-12-12
  Administered 2020-12-12: 20 mg via INTRAVENOUS

## 2020-12-12 MED ORDER — IRON SUCROSE 20 MG/ML IV SOLN
300.0000 mg | Freq: Once | INTRAVENOUS | Status: AC
  Administered 2020-12-12: 300 mg via INTRAVENOUS
  Filled 2020-12-12: qty 15

## 2020-12-12 MED ORDER — DEXAMETHASONE SODIUM PHOSPHATE 100 MG/10ML IJ SOLN
10.0000 mg | Freq: Once | INTRAMUSCULAR | Status: AC
  Administered 2020-12-12: 10 mg via INTRAVENOUS
  Filled 2020-12-12: qty 1

## 2020-12-12 MED ORDER — SODIUM CHLORIDE 0.9 % IV SOLN
Freq: Once | INTRAVENOUS | Status: AC
  Filled 2020-12-12: qty 250

## 2020-12-12 MED ORDER — FAMOTIDINE IN NACL 20-0.9 MG/50ML-% IV SOLN
INTRAVENOUS | Status: AC
  Filled 2020-12-12: qty 50

## 2020-12-12 MED ORDER — ACETAMINOPHEN 500 MG PO TABS
1000.0000 mg | ORAL_TABLET | Freq: Once | ORAL | Status: AC
Start: 2020-12-12 — End: 2020-12-12
  Administered 2020-12-12: 1000 mg via ORAL

## 2020-12-12 MED ORDER — ACETAMINOPHEN 500 MG PO TABS
ORAL_TABLET | ORAL | Status: AC
  Filled 2020-12-12: qty 2

## 2020-12-12 NOTE — Patient Instructions (Signed)

## 2020-12-18 ENCOUNTER — Other Ambulatory Visit (HOSPITAL_COMMUNITY): Payer: Self-pay

## 2020-12-18 MED FILL — Venlafaxine HCl Cap ER 24HR 75 MG (Base Equivalent): ORAL | 90 days supply | Qty: 90 | Fill #0 | Status: AC

## 2020-12-18 MED FILL — Tamoxifen Citrate Tab 20 MG (Base Equivalent): ORAL | 90 days supply | Qty: 90 | Fill #0 | Status: AC

## 2020-12-21 ENCOUNTER — Other Ambulatory Visit (HOSPITAL_COMMUNITY): Payer: Self-pay

## 2021-01-26 ENCOUNTER — Other Ambulatory Visit (HOSPITAL_COMMUNITY): Payer: Self-pay

## 2021-01-26 DIAGNOSIS — Z79899 Other long term (current) drug therapy: Secondary | ICD-10-CM | POA: Diagnosis not present

## 2021-01-26 DIAGNOSIS — F411 Generalized anxiety disorder: Secondary | ICD-10-CM | POA: Diagnosis not present

## 2021-01-26 DIAGNOSIS — F330 Major depressive disorder, recurrent, mild: Secondary | ICD-10-CM | POA: Diagnosis not present

## 2021-01-26 MED ORDER — ALPRAZOLAM 0.5 MG PO TABS
ORAL_TABLET | ORAL | 1 refills | Status: DC
  Filled 2021-01-26: qty 180, 90d supply, fill #0

## 2021-01-26 MED ORDER — VENLAFAXINE HCL ER 75 MG PO CP24
75.0000 mg | ORAL_CAPSULE | Freq: Every day | ORAL | 1 refills | Status: DC
  Filled 2021-01-26 – 2021-03-28 (×2): qty 90, 90d supply, fill #0
  Filled 2021-07-06: qty 90, 90d supply, fill #1

## 2021-01-31 ENCOUNTER — Other Ambulatory Visit (HOSPITAL_COMMUNITY): Payer: Self-pay

## 2021-01-31 DIAGNOSIS — H5213 Myopia, bilateral: Secondary | ICD-10-CM | POA: Diagnosis not present

## 2021-01-31 MED FILL — Metoprolol Succinate Tab ER 24HR 50 MG (Tartrate Equiv): ORAL | 90 days supply | Qty: 90 | Fill #0 | Status: AC

## 2021-01-31 MED FILL — Alprazolam Tab 0.5 MG: ORAL | 90 days supply | Qty: 180 | Fill #0 | Status: CN

## 2021-02-07 ENCOUNTER — Other Ambulatory Visit: Payer: Self-pay | Admitting: Hematology and Oncology

## 2021-02-07 DIAGNOSIS — Z1231 Encounter for screening mammogram for malignant neoplasm of breast: Secondary | ICD-10-CM

## 2021-02-13 DIAGNOSIS — C50412 Malignant neoplasm of upper-outer quadrant of left female breast: Secondary | ICD-10-CM | POA: Diagnosis not present

## 2021-02-17 ENCOUNTER — Other Ambulatory Visit: Payer: Self-pay

## 2021-02-17 ENCOUNTER — Emergency Department (HOSPITAL_COMMUNITY): Payer: 59

## 2021-02-17 ENCOUNTER — Emergency Department (HOSPITAL_COMMUNITY)
Admission: EM | Admit: 2021-02-17 | Discharge: 2021-02-17 | Disposition: A | Discharge status: Home / Self Care | Payer: 59 | Attending: Emergency Medicine | Admitting: Emergency Medicine

## 2021-02-17 DIAGNOSIS — N3289 Other specified disorders of bladder: Secondary | ICD-10-CM | POA: Diagnosis not present

## 2021-02-17 DIAGNOSIS — Z853 Personal history of malignant neoplasm of breast: Secondary | ICD-10-CM | POA: Diagnosis not present

## 2021-02-17 DIAGNOSIS — Z79899 Other long term (current) drug therapy: Secondary | ICD-10-CM | POA: Insufficient documentation

## 2021-02-17 DIAGNOSIS — R1904 Left lower quadrant abdominal swelling, mass and lump: Secondary | ICD-10-CM | POA: Diagnosis not present

## 2021-02-17 DIAGNOSIS — R112 Nausea with vomiting, unspecified: Secondary | ICD-10-CM | POA: Diagnosis not present

## 2021-02-17 DIAGNOSIS — N858 Other specified noninflammatory disorders of uterus: Secondary | ICD-10-CM | POA: Diagnosis not present

## 2021-02-17 DIAGNOSIS — R197 Diarrhea, unspecified: Secondary | ICD-10-CM | POA: Insufficient documentation

## 2021-02-17 DIAGNOSIS — R1032 Left lower quadrant pain: Secondary | ICD-10-CM | POA: Diagnosis not present

## 2021-02-17 DIAGNOSIS — Z923 Personal history of irradiation: Secondary | ICD-10-CM | POA: Insufficient documentation

## 2021-02-17 DIAGNOSIS — Z91040 Latex allergy status: Secondary | ICD-10-CM | POA: Diagnosis not present

## 2021-02-17 DIAGNOSIS — K5900 Constipation, unspecified: Secondary | ICD-10-CM | POA: Insufficient documentation

## 2021-02-17 LAB — CBC WITH DIFFERENTIAL/PLATELET
Abs Immature Granulocytes: 0.03 10*3/uL (ref 0.00–0.07)
Basophils Absolute: 0.1 10*3/uL (ref 0.0–0.1)
Basophils Relative: 1 %
Eosinophils Absolute: 0.2 10*3/uL (ref 0.0–0.5)
Eosinophils Relative: 2 %
HCT: 33.9 % — ABNORMAL LOW (ref 36.0–46.0)
Hemoglobin: 11 g/dL — ABNORMAL LOW (ref 12.0–15.0)
Immature Granulocytes: 0 %
Lymphocytes Relative: 23 %
Lymphs Abs: 1.7 10*3/uL (ref 0.7–4.0)
MCH: 27.8 pg (ref 26.0–34.0)
MCHC: 32.4 g/dL (ref 30.0–36.0)
MCV: 85.8 fL (ref 80.0–100.0)
Monocytes Absolute: 0.5 10*3/uL (ref 0.1–1.0)
Monocytes Relative: 7 %
Neutro Abs: 4.9 10*3/uL (ref 1.7–7.7)
Neutrophils Relative %: 67 %
Platelets: 435 10*3/uL — ABNORMAL HIGH (ref 150–400)
RBC: 3.95 MIL/uL (ref 3.87–5.11)
RDW: 14 % (ref 11.5–15.5)
WBC: 7.3 10*3/uL (ref 4.0–10.5)
nRBC: 0 % (ref 0.0–0.2)

## 2021-02-17 LAB — URINALYSIS, ROUTINE W REFLEX MICROSCOPIC
Bilirubin Urine: NEGATIVE
Glucose, UA: NEGATIVE mg/dL
Hgb urine dipstick: NEGATIVE
Ketones, ur: 5 mg/dL — AB
Leukocytes,Ua: NEGATIVE
Nitrite: NEGATIVE
Protein, ur: NEGATIVE mg/dL
Specific Gravity, Urine: 1.02 (ref 1.005–1.030)
pH: 5 (ref 5.0–8.0)

## 2021-02-17 LAB — COMPREHENSIVE METABOLIC PANEL
ALT: 19 U/L (ref 0–44)
AST: 21 U/L (ref 15–41)
Albumin: 3.6 g/dL (ref 3.5–5.0)
Alkaline Phosphatase: 36 U/L — ABNORMAL LOW (ref 38–126)
Anion gap: 8 (ref 5–15)
BUN: 9 mg/dL (ref 6–20)
CO2: 25 mmol/L (ref 22–32)
Calcium: 8.5 mg/dL — ABNORMAL LOW (ref 8.9–10.3)
Chloride: 106 mmol/L (ref 98–111)
Creatinine, Ser: 0.64 mg/dL (ref 0.44–1.00)
GFR, Estimated: >60 mL/min (ref >60)
Glucose, Bld: 92 mg/dL (ref 70–99)
Potassium: 3.8 mmol/L (ref 3.5–5.1)
Sodium: 139 mmol/L (ref 135–145)
Total Bilirubin: 0.2 mg/dL — ABNORMAL LOW (ref 0.3–1.2)
Total Protein: 6.1 g/dL — ABNORMAL LOW (ref 6.5–8.1)

## 2021-02-17 LAB — LIPASE, BLOOD: Lipase: 19 U/L (ref 11–51)

## 2021-02-17 LAB — PREGNANCY, URINE: Preg Test, Ur: NEGATIVE

## 2021-02-17 MED ORDER — MORPHINE SULFATE (PF) 4 MG/ML IV SOLN
4.0000 mg | Freq: Once | INTRAVENOUS | Status: AC
Start: 2021-02-17 — End: 2021-02-17
  Administered 2021-02-17: 4 mg via INTRAVENOUS
  Filled 2021-02-17: qty 1

## 2021-02-17 MED ORDER — OXYCODONE-ACETAMINOPHEN 5-325 MG PO TABS
1.0000 | ORAL_TABLET | Freq: Once | ORAL | Status: AC
  Administered 2021-02-17: 1 via ORAL
  Filled 2021-02-17: qty 1

## 2021-02-17 MED ORDER — ONDANSETRON HCL 4 MG/2ML IJ SOLN
4.0000 mg | Freq: Once | INTRAMUSCULAR | Status: AC
  Administered 2021-02-17: 4 mg via INTRAVENOUS
  Filled 2021-02-17: qty 2

## 2021-02-17 MED ORDER — LACTATED RINGERS IV BOLUS
1000.0000 mL | Freq: Once | INTRAVENOUS | Status: AC
  Administered 2021-02-17: 1000 mL via INTRAVENOUS

## 2021-02-17 MED ORDER — ONDANSETRON 4 MG PO TBDP: 4.0000 mg | ORAL_TABLET | Freq: Three times a day (TID) | ORAL | 0 refills | Status: DC | PRN

## 2021-02-17 MED ORDER — OXYCODONE-ACETAMINOPHEN 5-325 MG PO TABS: 1.0000 | ORAL_TABLET | Freq: Four times a day (QID) | ORAL | 0 refills | Status: DC | PRN

## 2021-02-17 NOTE — ED Triage Notes (Signed)
Patient reports left lower abdominal pain starting 1 week ago. Denies injury. Pain rated 6/10.

## 2021-02-17 NOTE — Discharge Instructions (Addendum)
You were seen in the ER today for your abdominal pain and were found to have a large mass in your left abdomen.   You have been prescribed a pain medication called percocet and a nausea medication called zofran to take as needed at home.   Dr. Denman George has planned to see you in her office first thing Monday morning 6/6 at 8:30 am.   Return to the ED sooner if you develop any sudden worsening of your abdominal pain, nausea or vomiting that does not stop, chest pain, shortness of breath or any other new severe symptoms.

## 2021-02-17 NOTE — ED Notes (Signed)
Patient would like blood draw with an IV start.

## 2021-02-17 NOTE — ED Provider Notes (Signed)
Emergency Medicine Provider Triage Evaluation Note  Anne Horton , a 45 y.o. female  was evaluated in triage.  Pt complains of LLQ pain x 1 week. No N/V/D, urinary symptoms. No fevers/chills. On tamoxifen.   Review of Systems  Positive: LLQ pain Negative: N/V/D, urinary symptoms, vaginal bleeding or discharge.   Physical Exam  BP 133/68 (BP Location: Right Arm)   Pulse 74   Temp 98.3 F (36.8 C) (Oral)   Resp 16   Ht 5\' 2"  (1.575 m)   Wt 64.4 kg   LMP 08/30/2020 (Exact Date)   SpO2 100%   BMI 25.97 kg/m  Gen:   Awake, no distress   Resp:  Normal effort  MSK:   Moves extremities without difficulty  Other:  LLQ TTP with guarding, no rebound.  Medical Decision Making  Medically screening exam initiated at 10:47 AM.  Appropriate orders placed.  Anne Horton was informed that the remainder of the evaluation will be completed by another provider, this initial triage assessment does not replace that evaluation, and the importance of remaining in the ED until their evaluation is complete.  This chart was dictated using voice recognition software, Dragon. Despite the best efforts of this provider to proofread and correct errors, errors may still occur which can change documentation meaning.    Aura Dials 02/17/21 1051    Carmin Muskrat, MD 02/19/21 [phone]

## 2021-02-17 NOTE — ED Provider Notes (Signed)
Anne Horton   CSN: [id number] Arrival date & time: 02/17/21  1019     History Chief Complaint  Patient presents with  . Abdominal Pain    Anne Horton is a 45 y.o. female with history of left breast carcinoma on tamoxifen who presents with concern for 1 week of left lower abdominal pain without nausea, vomiting, diarrhea.  She is still having bowel movements, however has been chronically intermittently constipated and states there is been no change in this for her.  Denies any fevers or chills or urinary symptoms.  I personally reviewed this patient's medical records, diagnosed with left breast invasive ductal carcinoma in 2019, T1CN0.  Patient underwent left lumpectomy, ER/PR positive, HER2 negative she underwent adjuvant radiation therapy and is now on tamoxifen.  Personally reviewed this patient's medical records.  From her history of malignancy she has history of neurofibromatosis and family history of multiple cancers.  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  HPI     Past Medical History:  Diagnosis Date  . Allergy   . Anemia   . Anxiety   . Breast cancer ([place]) 05/07/2018   left invasive ductal   . Cancer ([place])   . Chest pain   . Depression   . Family history of breast cancer   . Fibromyalgia    neurofibromatosis  . H/O: depression   . Hx of migraines   . Mitral regurgitation    mild per echo EF 55-60%  . Neurofibromatosis ([place])   . Palpitations   . Personal history of radiation therapy   . Tachycardia    Hx. of  . Tachycardia   . Wears contact lenses     Patient Active Problem List   Diagnosis Date Noted  . Genetic testing 04/16/2018  . Malignant neoplasm of upper-outer quadrant of left breast in female, estrogen receptor positive ([place]) 03/25/2018  . Family history of breast cancer 03/24/2018  . Anemia, iron deficiency 10/20/2014  . Chest pain   . Palpitations    . Neurofibromatosis ([place])   . Tachycardia   . Hx of migraines   . H/O: depression   . Mitral regurgitation   . BLOOD IN STOOL 01/12/2009  . BLOOD IN STOOL, OCCULT 01/12/2009    Past Surgical History:  Procedure Laterality Date  . BREAST BIOPSY    . BREAST LUMPECTOMY Left 05/07/2018   Invasive ductal   . BREAST LUMPECTOMY WITH RADIOACTIVE SEED AND SENTINEL LYMPH NODE BIOPSY Left 05/07/2018   Procedure: BREAST LUMPECTOMY WITH RADIOACTIVE SEED AND SENTINEL LYMPH NODE BIOPSY;  Surgeon: Rolm Bookbinder, MD;  Location: [facility];  Service: General;  Laterality: Left;  . CESAREAN SECTION    . CHOLECYSTECTOMY N/A 01/04/2015   Procedure: LAPAROSCOPIC CHOLECYSTECTOMY WITH INTRAOPERATIVE CHOLANGIOGRAM;  Surgeon: Erroll Luna, MD;  Location: [place];  Service: General;  Laterality: N/A;  . COLONOSCOPY    . ESOPHAGOGASTRODUODENOSCOPY    . MASTOPEXY Bilateral 09/05/2020   Procedure: BILATERAL BREAST MASTOPEXY;  Surgeon: Irene Limbo, MD;  Location: [place];  Service: Plastics;  Laterality: Bilateral;  . WISDOM TOOTH EXTRACTION       OB History   No obstetric history on file.     Family History  Problem Relation Age of Onset  . Breast cancer Mother 19       estrogen negative, recurred in 2012  . Diabetes Mother   . Neurofibromatosis Father   .  Atrial fibrillation Maternal Grandfather   . Cancer Paternal Grandmother        type unk, died at 11  . Neurofibromatosis Paternal Grandmother   . Stroke Paternal Grandfather   . Heart disease Paternal Uncle   . Diabetes Paternal Uncle   . Heart attack Neg Hx   . Hypertension Neg Hx     Social History   Tobacco Use  . Smoking status: Never Smoker  . Smokeless tobacco: Never Used  Vaping Use  . Vaping Use: Never used  Substance Use Topics  . Alcohol use: Yes    Alcohol/week: 0.0 standard drinks    Comment: occasional but rarely  . Drug use: No    Home Medications Prior  to Admission medications   Medication Sig Start Date End Date Taking? Authorizing Provider  ALPRAZolam (XANAX) 0.5 MG tablet Take 0.5 mg by mouth at bedtime as needed (FOR ANXIETY). :take 1/2 to 1 twice a day as needed 06/16/14   [provider]  ALPRAZolam Duanne Moron) 0.5 MG tablet TAKE 1/2 TO 1 TABLET BY MOUTH TWO TIMES DAILY AS NEEDED 08/09/20 05/01/21  Lendon Colonel, MD  ALPRAZolam Duanne Moron) 0.5 MG tablet Take 1/2 to 1 tablet by mouth 2 times a day as needed 01/26/21     cetirizine (ZYRTEC) 10 MG tablet Take 10 mg by mouth as needed for allergies.     [provider]  gabapentin (NEURONTIN) 100 MG capsule Take 1 capsule (100 mg total) by mouth at bedtime. 11/03/19   Nicholas Lose, MD  HYDROcodone-acetaminophen (NORCO/VICODIN) 5-325 MG tablet TAKE 1 TABLET BY MOUTH EVERY 4 HOURS AS NEEDED FOR UP TO 5 DAYS 08/28/20 02/24/21  Irene Limbo, MD  metoprolol succinate (TOPROL-XL) 50 MG 24 hr tablet TAKE 1 TABLET BY MOUTH DAILY. TAKE WITH OR IMMEDIATELY FOLLOWING A MEAL. 08/16/20 08/16/21  Imogene Burn, PA-C  Multiple Vitamin (MULTIVITAMIN) tablet Take 1 tablet by mouth daily.    [provider]  propranolol (INDERAL) 10 MG tablet TAKE 1 TABLET BY MOUTH FOUR TIMES DAILY AS NEEDED FOR PALPITATIONS 08/16/20 08/16/21  Imogene Burn, PA-C  tamoxifen (NOLVADEX) 20 MG tablet TAKE 1 TABLET BY MOUTH ONCE A DAY 08/21/20 08/21/21  Nicholas Lose, MD  venlafaxine XR (EFFEXOR-XR) 150 MG 24 hr capsule Take 150 mg by mouth daily with breakfast.    [provider]  venlafaxine XR (EFFEXOR-XR) 75 MG 24 hr capsule Take 1 capsule (75 mg total) by mouth daily. 01/26/21       Allergies    Latex  Review of Systems   Review of Systems  Constitutional: Negative.   HENT: Negative.   Eyes: Negative.   Respiratory: Negative.   Cardiovascular: Negative.   Gastrointestinal: Positive for abdominal distention and abdominal pain. Negative for anal bleeding, blood in stool, constipation, diarrhea,  nausea and vomiting.  Genitourinary: Negative.   Musculoskeletal: Negative.   Skin: Negative.   Neurological: Negative.     Physical Exam Updated Vital Signs BP (!) 141/86   Pulse 87   Temp 98.3 F (36.8 C) (Oral)   Resp 15   Ht _0  (1.575 m)   Wt 64.4 kg   LMP 08/30/2020 (Exact Date) Comment: waver scanned  SpO2 94%   BMI 25.97 kg/m   Physical Exam Vitals and nursing Horton reviewed.  Constitutional:      Appearance: She is not toxic-appearing.  HENT:     Head: Normocephalic and atraumatic.     Nose: Nose normal.     Mouth/Throat:  Mouth: Mucous membranes are moist.     Pharynx: Oropharynx is clear. Uvula midline. No oropharyngeal exudate, posterior oropharyngeal erythema or uvula swelling.     Tonsils: No tonsillar exudate.  Eyes:     General: Lids are normal. Vision grossly intact.        Right eye: No discharge.        Left eye: No discharge.     Extraocular Movements: Extraocular movements intact.     Conjunctiva/sclera: Conjunctivae normal.     Pupils: Pupils are equal, round, and reactive to light.  Neck:     Trachea: Trachea and phonation normal.  Cardiovascular:     Rate and Rhythm: Normal rate and regular rhythm.     Pulses: Normal pulses.     Heart sounds: Normal heart sounds. No murmur heard.   Pulmonary:     Effort: Pulmonary effort is normal. No tachypnea, bradypnea, accessory muscle usage, prolonged expiration or respiratory distress.     Breath sounds: Normal breath sounds. No wheezing or rales.  Chest:     Chest wall: No mass, lacerations, deformity, swelling, tenderness, crepitus or edema.  Abdominal:     General: Bowel sounds are normal. There is no distension.     Palpations: Abdomen is soft.     Tenderness: There is abdominal tenderness in the suprapubic area and left lower quadrant. There is guarding. There is no right CVA tenderness, left CVA tenderness or rebound. Negative signs include Murphy's sign.    Musculoskeletal:         General: No deformity.     Cervical back: Normal range of motion and neck supple. No edema, rigidity or crepitus. No pain with movement, spinous process tenderness or muscular tenderness.     Right lower leg: No edema.     Left lower leg: No edema.  Lymphadenopathy:     Cervical: No cervical adenopathy.  Skin:    General: Skin is warm and dry.     Capillary Refill: Capillary refill takes less than 2 seconds.  Neurological:     General: No focal deficit present.     Mental Status: She is alert and oriented to person, place, and time. Mental status is at baseline.     Sensory: Sensation is intact.     Motor: Motor function is intact.     Coordination: Coordination is intact.     Gait: Gait is intact.  Psychiatric:        Mood and Affect: Mood normal.    ED Results / Procedures / Treatments   Labs (all labs ordered are listed, but only abnormal results are displayed) Labs Reviewed  URINALYSIS, ROUTINE W REFLEX MICROSCOPIC - Abnormal; Notable for the following components:      Result Value   Ketones, ur 5 (*)    All other components within normal limits  CBC WITH DIFFERENTIAL/PLATELET - Abnormal; Notable for the following components:   Hemoglobin 11.0 (*)    HCT 33.9 (*)    Platelets 435 (*)    All other components within normal limits  COMPREHENSIVE METABOLIC PANEL - Abnormal; Notable for the following components:   Calcium 8.5 (*)    Total Protein 6.1 (*)    Alkaline Phosphatase 36 (*)    Total Bilirubin 0.2 (*)    All other components within normal limits  URINE CULTURE  LIPASE, BLOOD  PREGNANCY, URINE  CEA  CA 125    EKG None  Radiology CT Abdomen Pelvis Wo Contrast  Result Date: 02/17/2021 CLINICAL  DATA:  Left lower quadrant pain. History of breast carcinoma. EXAM: CT ABDOMEN AND PELVIS WITHOUT CONTRAST TECHNIQUE: Multidetector CT imaging of the abdomen and pelvis was performed following the standard protocol without oral or IV contrast. COMPARISON:  None.  FINDINGS: Lower chest: There is mild atelectasis in the inferior lingula. Lung bases otherwise are clear. Hepatobiliary: No focal liver lesions are appreciable on this noncontrast enhanced study. Gallbladder wall is not appreciably thickened. There is no biliary duct dilatation. Pancreas: There is no pancreatic mass or inflammatory focus. Spleen: There is mild perisplenic fluid. No splenic lesions are evident on this noncontrast enhanced study. Adrenals/Urinary Tract: No adrenal lesions evident. No evident renal mass or hydronephrosis on either side. No appreciable renal or ureteral calculus on either side. Urinary bladder is midline with wall thickness within normal limits. Stomach/Bowel: There is no appreciable bowel wall thickening. There is no appreciable bowel obstruction. A large mass in the left mid to lower abdomen displaces bowel medially but does not appear to invade bowel. There is no bowel obstruction. Terminal ileum appears normal. No periappendiceal region inflammation. Appendix normal in size and contour. No free air or portal venous air. Vascular/Lymphatic: No abdominal aortic aneurysm. No vascular lesions are appreciable on this noncontrast enhanced study. There is no appreciable adenopathy in the abdomen or pelvis. Reproductive: The uterus is anteverted. No intrauterine mass. There is a mass occupying much of the left mid to lower abdomen which while predominantly cystic, does contain areas of apparent septation and mild wall thickening. This mass measures 14.6 cm from superior to inferior dimension. This mass measures 15.7 cm from right to left dimension and 11.1 cm from anterior to posterior dimension. There is fluid tracking from this mass into the lateral left pelvis and into the cul-de-sac region. There is what is felt to represent normal ovarian tissue on the left. A normal right ovary is not appreciable by CT. There is an apparent cystic mass anterior and slightly inferior to the uterus  which impresses upon but is separate from the superior aspect of the bladder. This cystic mass measures 5.7 cm from right to left dimension, 2.4 cm from anterior to posterior dimension, and 2.8 cm from superior to inferior dimension. Other: No abscess is appreciable in the abdomen or pelvis. There is mesenteric thickening in the left upper quadrant region near the large left predominantly cystic mass. No well-defined omental mass. Musculoskeletal: No blastic or lytic bone lesions are appreciable. Probable small bone island in the right femoral neck. Suspected bone island in the L1 vertebral body posteriorly as well. No intramuscular lesions. IMPRESSION: 1. There is a large predominantly cystic but complex mass in the left mid to lower abdomen measuring 14.6 x 15.7 x 11.1 cm. This mass may arise eccentrically from the left ovary, or may arise within the mesentery. There appears to be normal ovarian tissue inferomedial to this mass. A connection of this mass is not seen by CT, although there is fluid surrounding the left ovary which tracks from this mass. Horton that fluid tracks along the left abdomen and pelvis into the cul-de-sac. Suspect leakage from this large left-sided cystic mass. Nearby mesenteric thickening in the anterior left upper to mid abdomen likely is of inflammatory etiology secondary to apparent leakage from this complex mass. Neoplastic involvement of the omentum in this area is possible, although there are no well-defined omental lesions. Horton that cystic ovarian neoplasm or predominantly cystic neoplasm arising from the mesentery could present in this manner and are differential  considerations based on the CT appearance. This mass displaces bowel toward the midline in the left abdomen but does not invade bowel. 2. There is mild perisplenic fluid on the left which could arise secondary to fluid tracking from the left-sided cystic abdominal/pelvic mass. If there is history of trauma, injury to the  spleen with pericystic fluid could present in this manner. Spleen appears intact on noncontrast enhanced CT. Clinical assessment in this regard advised. Horton that trauma also could result in rupture of a large predominantly cystic mass as noted in this case. 3. Cystic mass anterior to the inferior uterus which impresses upon the urinary bladder but is separate from the bladder. This cystic mass measures 5.7 x 2.4 x 2.8 cm. Its etiology is uncertain. It may arise from right ovary. Horton that normal right ovary is not seen. Potentially pelvic ultrasound could be helpful for further assessment in this regard. 4. No hydronephrosis. No renal or ureteral calculus on either side. Urinary bladder is midline with wall thickness within normal limits. 5.  No bowel obstruction.  No abscess in the abdomen or pelvis. 6.  Gallbladder absent. 7. Sclerotic area in the posterior aspect of the L1 vertebral body likely represents a bone island. Probable second bone island in the right femoral neck region. Horton that patient has a history of breast carcinoma. Given the propensity of breast carcinoma to present with sclerotic metastatic foci, it may be prudent to consider nuclear medicine bone scan for further assessment. Electronically Signed   By: Lowella Grip III M.D.   On: 02/17/2021 11:45   Procedures .Critical Care Performed by: Emeline Darling, PA-C Authorized by: Emeline Darling, PA-C   Critical care provider statement:    Critical care time (minutes):  45   Critical care was time spent personally by me on the following activities:  Discussions with consultants, evaluation of patient's response to treatment, examination of patient, ordering and performing treatments and interventions, ordering and review of laboratory studies, ordering and review of radiographic studies, pulse oximetry, re-evaluation of patient's condition, obtaining history from patient or surrogate and review of old charts      Medications Ordered in ED Medications  oxyCODONE-acetaminophen (PERCOCET/ROXICET) 5-325 MG per tablet 1 tablet (1 tablet Oral Given 02/17/21 1246)  morphine 4 MG/ML injection 4 mg (4 mg Intravenous Given 02/17/21 1414)  ondansetron (ZOFRAN) injection 4 mg (4 mg Intravenous Given 02/17/21 1414)  morphine 4 MG/ML injection 4 mg (4 mg Intravenous Given 02/17/21 1537)  ondansetron (ZOFRAN) injection 4 mg (4 mg Intravenous Given 02/17/21 1537)  lactated ringers bolus 1,000 mL (1,000 mLs Intravenous New Bag/Given 02/17/21 1552)    ED Course  I have reviewed the triage vital signs and the nursing notes.  Pertinent labs & imaging results that were available during my care of the patient were reviewed by me and considered in my medical decision making (see chart for details).  Clinical Course as of 02/17/21 1603  Sat Feb 17, 2021  1325 Consult call from Dr. Denman George with gynecologic oncology, who states patient will likely need surgery, this cannot be performed until the week as she will not have appropriate surgical staff.  Recommends to discharge home with pain medication, assuming laboratory studies are reasonable for this disposition plan, and she will see the patient first thing at 830 Monday morning.  Patient will require further work-up prior to surgical planning.  I appreciate her collaboration with care of the patient. [RS]    Clinical Course User Index [RS]  Kaspar Albornoz, Sharlene Dory   MDM Rules/Calculators/A&P                         45 year old who presents with concern for left lower abdominal pain x1 week.  History of left breast invasive ductal carcinoma on tamoxifen.  Differential diagnosis includes and limited to diverticulitis, appendicitis, colitis, gastroenteritis, constipation, TOA, ovarian cyst, ovarian torsion, or neoplasm.  Vital signs are normal on intake.  Cardiopulmonary exam is normal, abdominal exam with large palpable mass in the left mid abdomen with exquisite tenderness to  palpation with guarding but without rebound.  Mild tenderness to palpation in the suprapubic area.  I personally saw the patient in triage and ordered preliminary test.  Unfortunately patient has difficult venous access in the right arm and the left arm is restricted given history of lumpectomy on that side.  Nursing staff is still working on obtaining IV access to obtain laboratory studies and administer pain medications.  We will proceed with oral Percocet at this time.  CT scan of the abdomen pelvis had unfortunate findings with left lower abdominal mass measuring 14.6 x 15.7 x 11.1 cm, with some surrounding fluid.  Mass possibly arises from the left ovary and is somewhat cystic in nature though complex; no bowel obstruction..  Additionally there is a 5.7 x 2.4 x 2.8 cm mass anterior to the uterus which is compressing the urinary bladder.  Case discussed with gynecologic oncologist Dr. Denman George as above.  Disposition plan discussed with the patient.  Given no further oncologic work-up to be obtained over the weekend in the inpatient setting, patient prefers to be discharged home with oral analgesic and antiemetic medications with close outpatient oncologic follow-up.  Feel this is appropriate as does Dr. Denman George.  CBC with mild anemia with hemoglobin of 11, CMP unremarkable, UA unremarkable.  Lipase is normal.  At the request of the oncologist, CA125 and CEA are pending at this time.  No further work-up warranted in ED.  Anne Horton voiced understanding of her medical evaluation and treatment plan.  Each of her questions was answered to her expressed satisfaction.  Strict return precautions were given.  Patient is stable and appropriate for discharge at this time.  This chart was dictated using voice recognition software, Dragon. Despite the best efforts of this provider to proofread and correct errors, errors may still occur which can change documentation meaning.  Final Clinical Impression(s) / ED  Diagnoses Final diagnoses:  None    Rx / DC Orders ED Discharge Orders    None       Aura Dials 02/17/21 1603    Carmin Muskrat, MD 02/19/21 [phone]

## 2021-02-18 LAB — URINE CULTURE: Culture: NO GROWTH

## 2021-02-18 LAB — CEA: CEA: 0.9 ng/mL (ref 0.0–4.7)

## 2021-02-18 LAB — CA 125: Cancer Antigen (CA) 125: 64.4 U/mL — ABNORMAL HIGH (ref 0.0–38.1)

## 2021-02-19 ENCOUNTER — Inpatient Hospital Stay: Payer: 59 | Attending: Radiation Oncology | Admitting: Gynecologic Oncology

## 2021-02-19 ENCOUNTER — Ambulatory Visit: Payer: 59 | Admitting: Gynecologic Oncology

## 2021-02-19 ENCOUNTER — Ambulatory Visit (HOSPITAL_COMMUNITY)
Admission: RE | Admit: 2021-02-19 | Discharge: 2021-02-19 | Disposition: A | Discharge status: Home / Self Care | Payer: 59 | Source: Ambulatory Visit | Attending: Gynecologic Oncology | Admitting: Gynecologic Oncology

## 2021-02-19 ENCOUNTER — Telehealth: Payer: Self-pay | Admitting: Gynecologic Oncology

## 2021-02-19 ENCOUNTER — Other Ambulatory Visit: Payer: Self-pay

## 2021-02-19 VITALS — BP 124/68 | HR 72 | Temp 98.6°F | Resp 16 | Ht 62.0 in | Wt 147.0 lb

## 2021-02-19 DIAGNOSIS — Z79899 Other long term (current) drug therapy: Secondary | ICD-10-CM | POA: Insufficient documentation

## 2021-02-19 DIAGNOSIS — F419 Anxiety disorder, unspecified: Secondary | ICD-10-CM | POA: Diagnosis not present

## 2021-02-19 DIAGNOSIS — M797 Fibromyalgia: Secondary | ICD-10-CM | POA: Diagnosis not present

## 2021-02-19 DIAGNOSIS — I10 Essential (primary) hypertension: Secondary | ICD-10-CM | POA: Insufficient documentation

## 2021-02-19 DIAGNOSIS — N83202 Unspecified ovarian cyst, left side: Secondary | ICD-10-CM | POA: Diagnosis not present

## 2021-02-19 DIAGNOSIS — I340 Nonrheumatic mitral (valve) insufficiency: Secondary | ICD-10-CM | POA: Insufficient documentation

## 2021-02-19 DIAGNOSIS — F32A Depression, unspecified: Secondary | ICD-10-CM | POA: Insufficient documentation

## 2021-02-19 DIAGNOSIS — R1032 Left lower quadrant pain: Secondary | ICD-10-CM | POA: Insufficient documentation

## 2021-02-19 DIAGNOSIS — R1900 Intra-abdominal and pelvic swelling, mass and lump, unspecified site: Secondary | ICD-10-CM | POA: Diagnosis not present

## 2021-02-19 DIAGNOSIS — R971 Elevated cancer antigen 125 [CA 125]: Secondary | ICD-10-CM | POA: Diagnosis not present

## 2021-02-19 DIAGNOSIS — Q8500 Neurofibromatosis, unspecified: Secondary | ICD-10-CM | POA: Insufficient documentation

## 2021-02-19 DIAGNOSIS — Z923 Personal history of irradiation: Secondary | ICD-10-CM | POA: Diagnosis not present

## 2021-02-19 DIAGNOSIS — Z853 Personal history of malignant neoplasm of breast: Secondary | ICD-10-CM | POA: Insufficient documentation

## 2021-02-19 DIAGNOSIS — R188 Other ascites: Secondary | ICD-10-CM | POA: Diagnosis not present

## 2021-02-19 DIAGNOSIS — D251 Intramural leiomyoma of uterus: Secondary | ICD-10-CM | POA: Diagnosis not present

## 2021-02-19 NOTE — Patient Instructions (Signed)
I have ordered an ultrasound which will be performed this afternoon.  You will need a full bladder for the ultrasound.  I will call you with the results. If it looks like it's an ovarian process, I will look for a surgery spot later this week.  If it does not look ovarian, I  will reach out to one of the [facility] doctors to discuss consultation and planned surgery.

## 2021-02-19 NOTE — Progress Notes (Signed)
New Patient Note: Gyn-Onc  Consult was requested by the ER for the evaluation of Anne Horton 45 y.o. female  CC:  Chief Complaint  Patient presents with  . Pelvic mass    Assessment/Plan:  [name]  is a 45 y.o.  year old with a 15cm abdominopelvic mass, possible ovarian origin. She has a mildly elevated CA 125.  I have reviewed her CT images with the radiologist as well as with the patient.  I explained that we have suspicion that this mass is arising within the mesentery as we can see what appears to be a left ovary.  Therefore, I have ordered a TVUS.  If this shows ovaries bilaterally that are separate from this mass and not apparently involved by malignancy, I will recommend referral to surgical oncology for discussion of resection of the mesenteric mass.  MRI abdomen will also be ordered if the TVUS does not show obvious ovarian pathology.  She will require workup with a bone scan for the L1 and right femoral neck sclerotic lesions.    HPI: Ms "Anne Lull" Horton is a 45 year old P1 who was seen in consultation at the request of the ER doctors for evaluation of a 15cm abdominopelvic left sided cystic mass in the setting of abdominal pain.  The patient reported a 1 week history of left lower quadrant and left mid abdominal pain.  It was not relieved with ibuprofen which she has been taking regularly.  Due to the lack of improvement of symptoms she presented to the emergency department on 02/17/2021 for evaluation.  A CT scan of the abdomen pelvis was performed at that time without contrast (due to in availability of IV contrast) and showed a mass occupying much of the left mid to lower abdomen which while predominantly cystic, did contain areas of apparent septation and mild wall thickening. This mass measured 14.6 cm from superior to inferior dimension. This mass measures 15.7 cm from right to left dimension and 11.1 cm from anterior to posterior dimension. There is  fluid tracking from this mass into the lateral left pelvis and into the cul-de-sac region. There is what is felt to represent normal ovarian tissue on the left. A normal right ovary is not appreciable by CT. There is an apparent cystic mass anterior and slightly inferior to the uterus which impresses upon but is separate from the superior aspect of the bladder. This cystic mass measures 5.7 cm from right to left dimension, 2.4 cm from anterior to posterior dimension, and 2.8 cm from superior to inferior dimension. The origins of the mass were favored to be either the mesentery or left adnexa.  CEA was drawn on 02/17/21 and was normal at 0.9. CA 125 was drawn on 02/17/21 and was elevated to 64.  Her medical history is most significant for neurofibromatosis, SVT, stage IA breast cancer (ER positive) treated with surgery, radiation and tamoxifen, hypertension.  Her surgical history is most significant for laparoscopic cholecystectomy, cesarean section, breast lumpectomy.  Her gynecologic history is remarkable for cesarean section x 1.  Her family cancer history is remarkable for multiple family members with breast cancer.  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 She lives with her husband.   Current Meds:  Outpatient Encounter Medications as of 02/19/2021  Medication Sig  . ALPRAZolam (XANAX) 0.5 MG tablet Take 1/2 to 1 tablet by mouth 2  times a day as needed  . cetirizine (ZYRTEC) 10 MG tablet Take 10 mg by mouth as needed for allergies.   Marland Kitchen gabapentin (NEURONTIN) 100 MG capsule Take 1 capsule (100 mg total) by mouth at bedtime.  . metoprolol succinate (TOPROL-XL) 50 MG 24 hr tablet TAKE 1 TABLET BY MOUTH DAILY. TAKE WITH OR IMMEDIATELY FOLLOWING A MEAL.  . Multiple Vitamin (MULTIVITAMIN) tablet Take 1 tablet by mouth daily.  . ondansetron (ZOFRAN ODT) 4 MG disintegrating tablet Take 1 tablet (4 mg total) by mouth every  8 (eight) hours as needed for nausea or vomiting.  Marland Kitchen oxyCODONE-acetaminophen (PERCOCET/ROXICET) 5-325 MG tablet Take 1-2 tablets by mouth every 6 (six) hours as needed for severe pain.  Marland Kitchen propranolol (INDERAL) 10 MG tablet TAKE 1 TABLET BY MOUTH FOUR TIMES DAILY AS NEEDED FOR PALPITATIONS  . venlafaxine XR (EFFEXOR-XR) 75 MG 24 hr capsule Take 1 capsule (75 mg total) by mouth daily.  Marland Kitchen HYDROcodone-acetaminophen (NORCO/VICODIN) 5-325 MG tablet TAKE 1 TABLET BY MOUTH EVERY 4 HOURS AS NEEDED FOR UP TO 5 DAYS  . tamoxifen (NOLVADEX) 20 MG tablet TAKE 1 TABLET BY MOUTH ONCE A DAY (Patient not taking: Reported on 02/19/2021)  . [DISCONTINUED] ALPRAZolam (XANAX) 0.5 MG tablet Take 0.5 mg by mouth at bedtime as needed (FOR ANXIETY). :take 1/2 to 1 twice a day as needed  . [DISCONTINUED] ALPRAZolam (XANAX) 0.5 MG tablet TAKE 1/2 TO 1 TABLET BY MOUTH TWO TIMES DAILY AS NEEDED  . [DISCONTINUED] venlafaxine XR (EFFEXOR-XR) 150 MG 24 hr capsule Take 150 mg by mouth daily with breakfast.   No facility-administered encounter medications on file as of 02/19/2021.    Allergy:  Allergies  Allergen Reactions  . Latex Rash    Social Hx:   Social History   Socioeconomic History  . Marital status: Married    Spouse name: Not on file  . Number of children: 1  . Years of education: Not on file  . Highest education level: Not on file  Occupational History  . Occupation: [employer]  Tobacco Use  . Smoking status: Never Smoker  . Smokeless tobacco: Never Used  Vaping Use  . Vaping Use: Never used  Substance and Sexual Activity  . Alcohol use: Yes    Alcohol/week: 0.0 standard drinks    Comment: occasional but rarely  . Drug use: No  . Sexual activity: Yes    Birth control/protection: None  Other Topics Concern  . Not on file  Social History Narrative   Pt lives with husband and daughter   Social Determinants of Health   Financial Resource Strain: Not on file  Food Insecurity: Not on file   Transportation Needs: Not on file  Physical Activity: Not on file  Stress: Not on file  Social Connections: Not on file  Intimate Partner Violence: Not on file    Past Surgical Hx:  Past Surgical History:  Procedure Laterality Date  . BREAST BIOPSY    . BREAST LUMPECTOMY Left 05/07/2018   Invasive ductal   . BREAST LUMPECTOMY WITH RADIOACTIVE SEED AND SENTINEL LYMPH NODE BIOPSY Left 05/07/2018   Procedure: BREAST LUMPECTOMY WITH RADIOACTIVE SEED AND SENTINEL LYMPH NODE BIOPSY;  Surgeon: Rolm Bookbinder, MD;  Location: [facility];  Service: General;  Laterality: Left;  . CESAREAN SECTION    . CHOLECYSTECTOMY N/A 01/04/2015   Procedure: LAPAROSCOPIC CHOLECYSTECTOMY WITH INTRAOPERATIVE CHOLANGIOGRAM;  Surgeon: Erroll Luna, MD;  Location: [place];  Service: General;  Laterality: N/A;  .  COLONOSCOPY    . ESOPHAGOGASTRODUODENOSCOPY    . MASTOPEXY Bilateral 09/05/2020   Procedure: BILATERAL BREAST MASTOPEXY;  Surgeon: Irene Limbo, MD;  Location: [facility];  Service: Plastics;  Laterality: Bilateral;  . WISDOM TOOTH EXTRACTION      Past Medical Hx:  Past Medical History:  Diagnosis Date  . Allergy   . Anemia   . Anxiety   . Breast cancer ([place]) 05/07/2018   left invasive ductal   . Cancer ([place])   . Chest pain   . Depression   . Family history of breast cancer   . Fibromyalgia    neurofibromatosis  . H/O: depression   . Hx of migraines   . Mitral regurgitation    mild per echo EF 55-60%  . Neurofibromatosis ([place])   . Palpitations   . Personal history of radiation therapy   . Tachycardia    Hx. of  . Tachycardia   . Wears contact lenses     Past Gynecological History:  Cesarean section x 1. LMP in December, 2021. No LMP recorded.  Family Hx:  Family History  Problem Relation Age of Onset  . Breast cancer Mother 53       estrogen negative, recurred in 2012  . Diabetes Mother   . Neurofibromatosis Father   .  Atrial fibrillation Maternal Grandfather   . Cancer Paternal Grandmother        type unk, died at 29  . Neurofibromatosis Paternal Grandmother   . Stroke Paternal Grandfather   . Heart disease Paternal Uncle   . Diabetes Paternal Uncle   . Heart attack Neg Hx   . Hypertension Neg Hx     Review of Systems:  Constitutional  Feels unwell with discomfort  ENT Normal appearing ears and nares bilaterally Skin/Breast  No rash, sores, jaundice, itching, dryness Cardiovascular  No chest pain, shortness of breath, or edema  Pulmonary  No cough or wheeze.  Gastro Intestinal  + abdominal pain (left), no change in bowel habit, no N&V. Genito Urinary  No frequency, urgency, dysuria, no bleeding Musculo Skeletal  No myalgia, arthralgia, joint swelling or pain  Neurologic  No weakness, numbness, change in gait,  Psychology  No depression, anxiety, insomnia.   Vitals:  Blood pressure 124/68, pulse 72, temperature 98.6 F (37 C), temperature source Oral, resp. rate 16, height 5\' 2"  (1.575 m), weight 147 lb (66.7 kg), SpO2 100 %.  Physical Exam: WD in NAD Neck  Supple NROM, without any enlargements.  Lymph Node Survey No cervical supraclavicular or inguinal adenopathy Cardiovascular  Pulse normal rate, regularity and rhythm. S1 and S2 normal.  Lungs  Clear to auscultation bilateraly, without wheezes/crackles/rhonchi. Good air movement.  Skin  No rash/lesions/breakdown  Psychiatry  Alert and oriented to person, place, and time  Abdomen  Normoactive bowel sounds, abdomen soft, and non-obese without evidence of hernia. Palpable, mobile left abdominal (mid) mass. Tender to palpate.  Back No CVA tenderness Genito Urinary  Vulva/vagina: Normal external female genitalia.  No lesions. No discharge or bleeding.  Bladder/urethra:  No lesions or masses, well supported bladder  Vagina: normal  Cervix: Normal appearing, no lesions.  Uterus:  Small, mobile, no parametrial involvement or  nodularity.  Adnexa: no discrete pelvic masses. However, the lower tip of the left mid abdominal mass is appreciated. It is tender to deep palpation here Rectal  deferred Extremities  No bilateral cyanosis, clubbing or edema.  60 minutes of total time was spent for this patient encounter,  including preparation, face-to-face counseling with the patient and coordination of care, review of imaging (results and images), communication with the referring provider and documentation of the encounter.   Thereasa Solo, MD  02/19/2021, 8:54 AM

## 2021-02-19 NOTE — Telephone Encounter (Signed)
Discussed findings with patient - US showed normal gynecologic organs and a separate mid abdominal mass (likely arising from mesentery). Reassured patient that this mass is not an enlarged ovary. She has an MR abdomen scheduled to better characterize the mass, and a consultation with Dr Barry Dienes for further evaluation/resection.  Suspect/favor a GIST tumor vs desmoid (given underlying history of NF).  Thereasa Solo, MD

## 2021-02-19 NOTE — Addendum Note (Signed)
Addended by: Thereasa Solo on: 02/19/2021 09:38 AM   Modules accepted: Orders

## 2021-02-20 ENCOUNTER — Telehealth: Payer: Self-pay

## 2021-02-20 NOTE — Telephone Encounter (Signed)
Anne Horton the appointment for MRI on Monday 02-26-21 at 12 noon. Arrive at 11:30 to register in admitting. NPO 4 hours prior to exam. Pt verbalized understanding.

## 2021-02-21 ENCOUNTER — Other Ambulatory Visit: Payer: Self-pay | Admitting: Gynecologic Oncology

## 2021-02-21 ENCOUNTER — Other Ambulatory Visit (HOSPITAL_COMMUNITY): Payer: Self-pay

## 2021-02-21 DIAGNOSIS — R1900 Intra-abdominal and pelvic swelling, mass and lump, unspecified site: Secondary | ICD-10-CM

## 2021-02-21 MED ORDER — TRAMADOL HCL 50 MG PO TABS
50.0000 mg | ORAL_TABLET | Freq: Four times a day (QID) | ORAL | 0 refills | Status: DC | PRN
  Filled 2021-02-21: qty 15, 3d supply, fill #0

## 2021-02-21 MED ORDER — OXYCODONE HCL 5 MG PO TABS
5.0000 mg | ORAL_TABLET | ORAL | 0 refills | Status: DC | PRN
  Filled 2021-02-21: qty 15, 2d supply, fill #0

## 2021-02-21 NOTE — Progress Notes (Signed)
Patient recently seen in the office for newly diagnosed abdominal/pelvic mass. Having moderate to severe abdominal pain with nausea. Discussed pain management recs. Prescribed tramadol to take during the day for discomfort if needed (to decrease chance of drowsiness symptoms) with oxycodone for severe pain to use at bedtime only. Advised to not take and drive. She understands she is not to take the medications together. She has an MRI scheduled on June 13 and an appt to meet with Dr. Barry Dienes with [facility] for further evaluation. She is advised to call for any questions or concerns.

## 2021-02-24 ENCOUNTER — Ambulatory Visit (HOSPITAL_COMMUNITY)
Admission: RE | Admit: 2021-02-24 | Discharge: 2021-02-24 | Disposition: A | Discharge status: Home / Self Care | Payer: 59 | Source: Ambulatory Visit | Attending: Gynecologic Oncology | Admitting: Gynecologic Oncology

## 2021-02-24 DIAGNOSIS — R1900 Intra-abdominal and pelvic swelling, mass and lump, unspecified site: Secondary | ICD-10-CM | POA: Insufficient documentation

## 2021-02-24 MED ORDER — GADOBUTROL 1 MMOL/ML IV SOLN
7.5000 mL | Freq: Once | INTRAVENOUS | Status: AC | PRN
  Administered 2021-02-24: 7.5 mL via INTRAVENOUS

## 2021-02-26 ENCOUNTER — Ambulatory Visit (HOSPITAL_COMMUNITY): Payer: 59

## 2021-03-05 ENCOUNTER — Other Ambulatory Visit (HOSPITAL_COMMUNITY): Payer: Self-pay

## 2021-03-05 ENCOUNTER — Other Ambulatory Visit: Payer: Self-pay | Admitting: General Surgery

## 2021-03-05 DIAGNOSIS — R1900 Intra-abdominal and pelvic swelling, mass and lump, unspecified site: Secondary | ICD-10-CM | POA: Diagnosis not present

## 2021-03-05 DIAGNOSIS — R000 Tachycardia, unspecified: Secondary | ICD-10-CM | POA: Diagnosis not present

## 2021-03-05 DIAGNOSIS — D509 Iron deficiency anemia, unspecified: Secondary | ICD-10-CM | POA: Diagnosis not present

## 2021-03-05 MED ORDER — ONDANSETRON 4 MG PO TBDP
ORAL_TABLET | ORAL | 0 refills | Status: DC
  Filled 2021-03-05: qty 20, 10d supply, fill #0

## 2021-03-05 MED ORDER — OXYCODONE-ACETAMINOPHEN 5-325 MG PO TABS
ORAL_TABLET | ORAL | 0 refills | Status: DC
  Filled 2021-03-05: qty 30, 7d supply, fill #0

## 2021-03-05 MED ORDER — TRAMADOL HCL 50 MG PO TABS
50.0000 mg | ORAL_TABLET | Freq: Four times a day (QID) | ORAL | 0 refills | Status: DC | PRN
  Filled 2021-03-05: qty 30, 8d supply, fill #0

## 2021-03-05 MED ORDER — METRONIDAZOLE 500 MG PO TABS
ORAL_TABLET | ORAL | 0 refills | Status: DC
  Filled 2021-03-05: qty 6, 1d supply, fill #0

## 2021-03-05 MED ORDER — NEOMYCIN SULFATE 500 MG PO TABS
ORAL_TABLET | ORAL | 0 refills | Status: DC
  Filled 2021-03-05: qty 6, 1d supply, fill #0

## 2021-03-07 ENCOUNTER — Inpatient Hospital Stay: Payer: 59

## 2021-03-07 ENCOUNTER — Other Ambulatory Visit: Payer: Self-pay

## 2021-03-07 DIAGNOSIS — D508 Other iron deficiency anemias: Secondary | ICD-10-CM

## 2021-03-07 DIAGNOSIS — R971 Elevated cancer antigen 125 [CA 125]: Secondary | ICD-10-CM | POA: Diagnosis not present

## 2021-03-07 DIAGNOSIS — R1900 Intra-abdominal and pelvic swelling, mass and lump, unspecified site: Secondary | ICD-10-CM | POA: Diagnosis not present

## 2021-03-07 DIAGNOSIS — F32A Depression, unspecified: Secondary | ICD-10-CM | POA: Diagnosis not present

## 2021-03-07 DIAGNOSIS — F419 Anxiety disorder, unspecified: Secondary | ICD-10-CM | POA: Diagnosis not present

## 2021-03-07 DIAGNOSIS — I10 Essential (primary) hypertension: Secondary | ICD-10-CM | POA: Diagnosis not present

## 2021-03-07 DIAGNOSIS — M797 Fibromyalgia: Secondary | ICD-10-CM | POA: Diagnosis not present

## 2021-03-07 DIAGNOSIS — Z923 Personal history of irradiation: Secondary | ICD-10-CM | POA: Diagnosis not present

## 2021-03-07 DIAGNOSIS — Z853 Personal history of malignant neoplasm of breast: Secondary | ICD-10-CM | POA: Diagnosis not present

## 2021-03-07 DIAGNOSIS — R1032 Left lower quadrant pain: Secondary | ICD-10-CM | POA: Diagnosis not present

## 2021-03-07 LAB — CBC WITH DIFFERENTIAL (CANCER CENTER ONLY)
Abs Immature Granulocytes: 0.11 10*3/uL — ABNORMAL HIGH (ref 0.00–0.07)
Basophils Absolute: 0.1 10*3/uL (ref 0.0–0.1)
Basophils Relative: 1 %
Eosinophils Absolute: 0.2 10*3/uL (ref 0.0–0.5)
Eosinophils Relative: 2 %
HCT: 28.4 % — ABNORMAL LOW (ref 36.0–46.0)
Hemoglobin: 9.4 g/dL — ABNORMAL LOW (ref 12.0–15.0)
Immature Granulocytes: 1 %
Lymphocytes Relative: 24 %
Lymphs Abs: 2.2 10*3/uL (ref 0.7–4.0)
MCH: 27.9 pg (ref 26.0–34.0)
MCHC: 33.1 g/dL (ref 30.0–36.0)
MCV: 84.3 fL (ref 80.0–100.0)
Monocytes Absolute: 0.8 10*3/uL (ref 0.1–1.0)
Monocytes Relative: 9 %
Neutro Abs: 5.7 10*3/uL (ref 1.7–7.7)
Neutrophils Relative %: 63 %
Platelet Count: 509 10*3/uL — ABNORMAL HIGH (ref 150–400)
RBC: 3.37 MIL/uL — ABNORMAL LOW (ref 3.87–5.11)
RDW: 13.9 % (ref 11.5–15.5)
WBC Count: 9.2 10*3/uL (ref 4.0–10.5)
nRBC: 0 % (ref 0.0–0.2)

## 2021-03-08 ENCOUNTER — Other Ambulatory Visit: Payer: Self-pay | Admitting: Genetic Counselor

## 2021-03-08 ENCOUNTER — Encounter: Payer: Self-pay | Admitting: Genetic Counselor

## 2021-03-08 DIAGNOSIS — Q8500 Neurofibromatosis, unspecified: Secondary | ICD-10-CM

## 2021-03-08 LAB — IRON AND TIBC
Iron: 39 ug/dL — ABNORMAL LOW (ref 41–142)
Saturation Ratios: 15 % — ABNORMAL LOW (ref 21–57)
TIBC: 264 ug/dL (ref 236–444)
UIBC: 225 ug/dL (ref 120–384)

## 2021-03-08 LAB — FERRITIN: Ferritin: 227 ng/mL (ref 11–307)

## 2021-03-08 NOTE — Progress Notes (Signed)
Patient called to tell me that she was found to have a GIST tumor and was recommended to have genetic testing.  She wanted to know if she needed more after having testing a few years back.  We looked at her report and it has a VUS in NF1, but the primary genes associated with GIST tumors were normal.  GIST can be found in NF1.  We discussed looking into whether this would chagne the classification of her variant from a VUS to pathogenic.  [facility] has offered to do RNA analysis on her VUS, as well has extended the family variant testing to affected family members.  Patient will get blood drawn next week for RNA testing and we will send a saliva kit to her father for DNA family variant testing.

## 2021-03-12 ENCOUNTER — Inpatient Hospital Stay: Payer: 59

## 2021-03-12 ENCOUNTER — Other Ambulatory Visit: Payer: Self-pay

## 2021-03-12 DIAGNOSIS — Q8500 Neurofibromatosis, unspecified: Secondary | ICD-10-CM

## 2021-03-12 LAB — GENETIC SCREENING ORDER

## 2021-03-15 ENCOUNTER — Other Ambulatory Visit: Payer: Self-pay | Admitting: Hematology and Oncology

## 2021-03-15 DIAGNOSIS — Z853 Personal history of malignant neoplasm of breast: Secondary | ICD-10-CM

## 2021-03-21 ENCOUNTER — Ambulatory Visit
Admission: RE | Admit: 2021-03-21 | Discharge: 2021-03-21 | Disposition: A | Discharge status: Home / Self Care | Payer: 59 | Source: Ambulatory Visit | Attending: Hematology and Oncology | Admitting: Hematology and Oncology

## 2021-03-21 ENCOUNTER — Other Ambulatory Visit: Payer: Self-pay

## 2021-03-21 DIAGNOSIS — R922 Inconclusive mammogram: Secondary | ICD-10-CM | POA: Diagnosis not present

## 2021-03-21 DIAGNOSIS — Z853 Personal history of malignant neoplasm of breast: Secondary | ICD-10-CM

## 2021-03-22 ENCOUNTER — Other Ambulatory Visit (HOSPITAL_COMMUNITY): Payer: Self-pay

## 2021-03-23 ENCOUNTER — Other Ambulatory Visit (HOSPITAL_COMMUNITY): Payer: Self-pay

## 2021-03-23 MED ORDER — ONDANSETRON 4 MG PO TBDP
ORAL_TABLET | ORAL | 0 refills | Status: DC
  Filled 2021-03-23: qty 20, 20d supply, fill #0

## 2021-03-26 ENCOUNTER — Other Ambulatory Visit: Payer: Self-pay

## 2021-03-26 ENCOUNTER — Inpatient Hospital Stay: Payer: 59 | Attending: Radiation Oncology | Admitting: *Deleted

## 2021-03-26 DIAGNOSIS — Z170 Estrogen receptor positive status [ER+]: Secondary | ICD-10-CM

## 2021-03-26 DIAGNOSIS — C50412 Malignant neoplasm of upper-outer quadrant of left female breast: Secondary | ICD-10-CM

## 2021-03-26 NOTE — Progress Notes (Signed)
[place] Directives Clinical Social Work  Patient was seen in [facility] at [facility].  Clinical Social Worker and [employer] met with patient in the Patient and [employer].  The patient designated Sandi Mariscal as their primary healthcare agent and Farrel Conners as their secondary agent.  Patient also completed healthcare living will.    Clinical Social Worker notarized documents and made copies for patient/family. Clinical Social Worker will send documents to medical records to be scanned into patient's chart. Clinical Social Worker encouraged patient/family to contact with any additional questions or concerns.   Johnnye Lana, MSW, LCSW, OSW-C Clinical Social Worker [facility] [phone]

## 2021-03-28 ENCOUNTER — Other Ambulatory Visit (HOSPITAL_COMMUNITY): Payer: Self-pay

## 2021-03-28 MED FILL — Metoprolol Succinate Tab ER 24HR 50 MG (Tartrate Equiv): ORAL | 90 days supply | Qty: 90 | Fill #1 | Status: CN

## 2021-03-28 MED FILL — Tamoxifen Citrate Tab 20 MG (Base Equivalent): ORAL | 90 days supply | Qty: 90 | Fill #1 | Status: AC

## 2021-03-30 NOTE — Pre-Procedure Instructions (Signed)
Surgical Instructions    Your procedure is scheduled on 04/03/21.  Report to [facility] Main Entrance "A" at 08:00 A.M., then check in with the Admitting office.  Call this number if you have problems the morning of surgery:  [phone]   If you have any questions prior to your surgery date call [phone]: Open Monday-Friday 8am-4pm    Remember:  Do not eat after midnight the night before your surgery  You may drink clear liquids until 07:00 the morning of your surgery.   Clear liquids allowed are: Water, Non-Citrus Juices (without pulp), Carbonated Beverages, Clear Tea, Black Coffee Only, and Gatorade  Please drink **2 - Pre-Surgery Ensures the night before surgery.  Please complete your PRE-SURGERY ENSURE that was provided to you by 07:00 the morning of surgery. Please, if able, drink it in one setting. DO NOT SIP.     Take these medicines the morning of surgery with A SIP OF WATER  cetirizine (ZYRTEC) metoprolol succinate (TOPROL-XL) venlafaxine XR (EFFEXOR-XR)  If needed you may take:  ALPRAZolam (XANAX)  ondansetron (ZOFRAN ODT)  senna-docusate (SENOKOT-S) traMADol (ULTRAM)  As of today, STOP taking any Aspirin (unless otherwise instructed by your surgeon) Aleve, Naproxen, Ibuprofen, Motrin, Advil, Goody's, BC's, all herbal medications, fish oil, and all vitamins.          Do not wear jewelry or makeup Do not wear lotions, powders, perfumes/colognes, or deodorant. Do not shave 48 hours prior to surgery.  Men may shave face and neck. Do not bring valuables to the hospital. DO Not wear nail polish, gel polish, artificial nails, or any other type of covering on  natural nails including finger and toenails. If patients have artificial nails, gel coating, etc. that need to be removed by a nail salon please have this removed prior to surgery or surgery may need to be canceled/delayed if the surgeon/ anesthesia feels like the patient is unable to be adequately monitored.              [facility] is not responsible for any belongings or valuables.  Do NOT Smoke (Tobacco/Vaping) or drink Alcohol 24 hours prior to your procedure If you use a CPAP at night, you may bring all equipment for your overnight stay.   Contacts, glasses, dentures or bridgework may not be worn into surgery, please bring cases for these belongings   For patients admitted to the hospital, discharge time will be determined by your treatment team.   Patients discharged the day of surgery will not be allowed to drive home, and someone needs to stay with them for 24 hours.  ONLY 1 SUPPORT PERSON MAY BE PRESENT WHILE YOU ARE IN SURGERY. IF YOU ARE TO BE ADMITTED ONCE YOU ARE IN YOUR ROOM YOU WILL BE ALLOWED TWO (2) VISITORS.  Minor children may have two parents present. Special consideration for safety and communication needs will be reviewed on a case by case basis.  Special instructions:    Oral Hygiene is also important to reduce your risk of infection.  Remember - BRUSH YOUR TEETH THE MORNING OF SURGERY WITH YOUR REGULAR TOOTHPASTE   [place]- Preparing For Surgery  Before surgery, you can play an important role. Because skin is not sterile, your skin needs to be as free of germs as possible. You can reduce the number of germs on your skin by washing with CHG (chlorahexidine gluconate) Soap before surgery.  CHG is an antiseptic cleaner which kills germs and bonds with the skin to continue  killing germs even after washing.     Please do not use if you have an allergy to CHG or antibacterial soaps. If your skin becomes reddened/irritated stop using the CHG.  Do not shave (including legs and underarms) for at least 48 hours prior to first CHG shower. It is OK to shave your face.  Please follow these instructions carefully.     Shower the NIGHT BEFORE SURGERY and the MORNING OF SURGERY with CHG Soap.   If you chose to wash your hair, wash your hair first as usual with your normal shampoo.  After you shampoo, rinse your hair and body thoroughly to remove the shampoo.  Then [employer] and genitals (private parts) with your normal soap and rinse thoroughly to remove soap.  After that Use CHG Soap as you would any other liquid soap. You can apply CHG directly to the skin and wash gently with a scrungie or a clean washcloth.   Apply the CHG Soap to your body ONLY FROM THE NECK DOWN.  Do not use on open wounds or open sores. Avoid contact with your eyes, ears, mouth and genitals (private parts). Wash Face and genitals (private parts)  with your normal soap.   Wash thoroughly, paying special attention to the area where your surgery will be performed.  Thoroughly rinse your body with warm water from the neck down.  DO NOT shower/wash with your normal soap after using and rinsing off the CHG Soap.  Pat yourself dry with a CLEAN TOWEL.  Wear CLEAN PAJAMAS to bed the night before surgery  Place CLEAN SHEETS on your bed the night before your surgery  DO NOT SLEEP WITH PETS.   Day of Surgery:  Take a shower with CHG soap. Wear Clean/Comfortable clothing the morning of surgery Do not apply any deodorants/lotions.   Remember to brush your teeth WITH YOUR REGULAR TOOTHPASTE.   Please read over the following fact sheets that you were given.

## 2021-04-02 ENCOUNTER — Encounter (HOSPITAL_COMMUNITY)
Admission: RE | Admit: 2021-04-02 | Discharge: 2021-04-02 | Disposition: A | Discharge status: Home / Self Care | Payer: 59 | Source: Ambulatory Visit | Attending: General Surgery | Admitting: General Surgery

## 2021-04-02 ENCOUNTER — Encounter (HOSPITAL_COMMUNITY): Payer: Self-pay

## 2021-04-02 ENCOUNTER — Other Ambulatory Visit: Payer: Self-pay

## 2021-04-02 DIAGNOSIS — D509 Iron deficiency anemia, unspecified: Secondary | ICD-10-CM | POA: Diagnosis not present

## 2021-04-02 DIAGNOSIS — C49A3 Gastrointestinal stromal tumor of small intestine: Secondary | ICD-10-CM | POA: Diagnosis not present

## 2021-04-02 DIAGNOSIS — D62 Acute posthemorrhagic anemia: Secondary | ICD-10-CM | POA: Diagnosis not present

## 2021-04-02 DIAGNOSIS — Z20822 Contact with and (suspected) exposure to covid-19: Secondary | ICD-10-CM | POA: Insufficient documentation

## 2021-04-02 DIAGNOSIS — Z853 Personal history of malignant neoplasm of breast: Secondary | ICD-10-CM | POA: Diagnosis not present

## 2021-04-02 DIAGNOSIS — R238 Other skin changes: Secondary | ICD-10-CM | POA: Diagnosis not present

## 2021-04-02 DIAGNOSIS — G8918 Other acute postprocedural pain: Secondary | ICD-10-CM | POA: Diagnosis not present

## 2021-04-02 DIAGNOSIS — D490 Neoplasm of unspecified behavior of digestive system: Secondary | ICD-10-CM | POA: Diagnosis not present

## 2021-04-02 DIAGNOSIS — Q8500 Neurofibromatosis, unspecified: Secondary | ICD-10-CM | POA: Diagnosis not present

## 2021-04-02 DIAGNOSIS — R1900 Intra-abdominal and pelvic swelling, mass and lump, unspecified site: Secondary | ICD-10-CM | POA: Diagnosis not present

## 2021-04-02 DIAGNOSIS — Z01812 Encounter for preprocedural laboratory examination: Secondary | ICD-10-CM | POA: Insufficient documentation

## 2021-04-02 DIAGNOSIS — I340 Nonrheumatic mitral (valve) insufficiency: Secondary | ICD-10-CM | POA: Diagnosis not present

## 2021-04-02 LAB — CBC WITH DIFFERENTIAL/PLATELET
Abs Immature Granulocytes: 0.12 10*3/uL — ABNORMAL HIGH (ref 0.00–0.07)
Basophils Absolute: 0.1 10*3/uL (ref 0.0–0.1)
Basophils Relative: 1 %
Eosinophils Absolute: 0.2 10*3/uL (ref 0.0–0.5)
Eosinophils Relative: 2 %
HCT: 34.3 % — ABNORMAL LOW (ref 36.0–46.0)
Hemoglobin: 10.6 g/dL — ABNORMAL LOW (ref 12.0–15.0)
Immature Granulocytes: 1 %
Lymphocytes Relative: 16 %
Lymphs Abs: 1.6 10*3/uL (ref 0.7–4.0)
MCH: 26.4 pg (ref 26.0–34.0)
MCHC: 30.9 g/dL (ref 30.0–36.0)
MCV: 85.5 fL (ref 80.0–100.0)
Monocytes Absolute: 0.7 10*3/uL (ref 0.1–1.0)
Monocytes Relative: 7 %
Neutro Abs: 7.4 10*3/uL (ref 1.7–7.7)
Neutrophils Relative %: 73 %
Platelets: 602 10*3/uL — ABNORMAL HIGH (ref 150–400)
RBC: 4.01 MIL/uL (ref 3.87–5.11)
RDW: 13.2 % (ref 11.5–15.5)
WBC: 10.1 10*3/uL (ref 4.0–10.5)
nRBC: 0 % (ref 0.0–0.2)

## 2021-04-02 LAB — COMPREHENSIVE METABOLIC PANEL
ALT: 13 U/L (ref 0–44)
AST: 19 U/L (ref 15–41)
Albumin: 3.4 g/dL — ABNORMAL LOW (ref 3.5–5.0)
Alkaline Phosphatase: 42 U/L (ref 38–126)
Anion gap: 7 (ref 5–15)
BUN: 14 mg/dL (ref 6–20)
CO2: 25 mmol/L (ref 22–32)
Calcium: 8.8 mg/dL — ABNORMAL LOW (ref 8.9–10.3)
Chloride: 105 mmol/L (ref 98–111)
Creatinine, Ser: 0.67 mg/dL (ref 0.44–1.00)
GFR, Estimated: >60 mL/min (ref >60)
Glucose, Bld: 99 mg/dL (ref 70–99)
Potassium: 4 mmol/L (ref 3.5–5.1)
Sodium: 137 mmol/L (ref 135–145)
Total Bilirubin: 0.4 mg/dL (ref 0.3–1.2)
Total Protein: 6.3 g/dL — ABNORMAL LOW (ref 6.5–8.1)

## 2021-04-02 LAB — SARS CORONAVIRUS 2 (TAT 6-24 HRS): SARS Coronavirus 2: NEGATIVE

## 2021-04-02 LAB — PROTIME-INR
INR: 1 (ref 0.8–1.2)
Prothrombin Time: 13.5 seconds (ref 11.4–15.2)

## 2021-04-02 NOTE — Anesthesia Preprocedure Evaluation (Addendum)
Anesthesia Evaluation  Patient identified by MRN, date of birth, ID band Patient awake    Reviewed: Allergy & Precautions, H&P , NPO status , Patient's Chart, lab work & pertinent test results  Airway Mallampati: II   Neck ROM: full    Dental   Pulmonary neg pulmonary ROS,    breath sounds clear to auscultation       Cardiovascular + Valvular Problems/Murmurs MR  Rhythm:regular Rate:Normal     Neuro/Psych PSYCHIATRIC DISORDERS Anxiety Depression neurofibromatosis  Neuromuscular disease    GI/Hepatic   Endo/Other    Renal/GU      Musculoskeletal  (+) Fibromyalgia -  Abdominal   Peds  Hematology   Anesthesia Other Findings   Reproductive/Obstetrics                             Anesthesia Physical Anesthesia Plan  ASA: 2  Anesthesia Plan: General   Post-op Pain Management:  Regional for Post-op pain   Induction: Intravenous  PONV Risk Score and Plan: 3 and Ondansetron, Dexamethasone, Midazolam and Treatment may vary due to age or medical condition  Airway Management Planned: Oral ETT  Additional Equipment:   Intra-op Plan:   Post-operative Plan: Extubation in OR  Informed Consent: I have reviewed the patients History and Physical, chart, labs and discussed the procedure including the risks, benefits and alternatives for the proposed anesthesia with the patient or authorized representative who has indicated his/her understanding and acceptance.     Dental advisory given  Plan Discussed with: CRNA, Anesthesiologist and Surgeon  Anesthesia Plan Comments: (PAT note written 04/02/2021 by Myra Gianotti, PA-C. )       Anesthesia Quick Evaluation

## 2021-04-02 NOTE — H&P (Signed)
Anne Horton Appointment: 03/05/2021 9:45 AM Location: [facility] Patient #: [id number] DOB: August 20, 1976 Married / Language: Anne Horton / Race: White Female   History of Present Illness [name] MD; 03/05/2021 10:48 AM) The patient is a 45 year old female who presents with an abdominal mass. Pt is a 45 yo F referred by Dr. Denman George for consultation regarding an abdominal mass.  She is a patient here of Dr. Donne Hazel for breast cancer.  She started having left sided abdominal pain that increased in intensity over a week or so. She was taking 800 mg ibuprofen 2-3 times per day.  She went to the ED and scan showed a large abdominal mass.  The CT was non contrasted and there was suggestion of proximity to the left ovary.  Because of this, she saw Dr. Denman George.  Also, she works in their department at the [employer].  Pelvic ultrasound demonstrated this mass was separate from the left ovary.  MRI was done and this was more concerning for GIST.  She continues to be quite sore and is on ibuprofen and tramadol.  She denies n/v/c/d.  She hasn't had any significant weight loss.  She has neurofibromatosis.    CT abd/pelvis without contrast 02/17/21  IMPRESSION: 1. There is a large predominantly cystic but complex mass in the left mid to lower abdomen measuring 14.6 x 15.7 x 11.1 cm. This mass may arise eccentrically from the left ovary, or may arise within the mesentery. There appears to be normal ovarian tissue inferomedial to this mass. A connection of this mass is not seen by CT, although there is fluid surrounding the left ovary which tracks from this mass. Note that fluid tracks along the left abdomen and pelvis into the cul-de-sac. Suspect leakage from this large left-sided cystic mass. Nearby mesenteric thickening in the anterior left upper to mid abdomen likely is of inflammatory etiology secondary to apparent leakage from this complex mass. Neoplastic involvement of the omentum in this  area is possible, although there are no well-defined omental lesions. Note that cystic ovarian neoplasm or predominantly cystic neoplasm arising from the mesentery could present in this manner and are differential considerations based on the CT appearance. This mass displaces bowel toward the midline in the left abdomen but does not invade bowel.  2. There is mild perisplenic fluid on the left which could arise secondary to fluid tracking from the left-sided cystic abdominal/pelvic mass. If there is history of trauma, injury to the spleen with pericystic fluid could present in this manner. Spleen appears intact on noncontrast enhanced CT. Clinical assessment in this regard advised. Note that trauma also could result in rupture of a large predominantly cystic mass as noted in this case.  3. Cystic mass anterior to the inferior uterus which impresses upon the urinary bladder but is separate from the bladder. This cystic mass measures 5.7 x 2.4 x 2.8 cm. Its etiology is uncertain. It may arise from right ovary. Note that normal right ovary is not seen. Potentially pelvic ultrasound could be helpful for further assessment in this regard.  4. No hydronephrosis. No renal or ureteral calculus on either side. Urinary bladder is midline with wall thickness within normal limits.  5.  No bowel obstruction.  No abscess in the abdomen or pelvis.  6.  Gallbladder absent.  7. Sclerotic area in the posterior aspect of the L1 vertebral body likely represents a bone island. Probable second bone island in the right femoral neck region. Note that patient  has a history of breast carcinoma. Given the propensity of breast carcinoma to present with sclerotic metastatic foci, it may be prudent to consider nuclear medicine bone scan for further assessment.  pelvic u/s 02/19/21  IMPRESSION: 14.8 cm complex cystic mass in the left mid abdomen, separate/distinct from the left ovary. This remains suspicious  for intra-abdominal cystic/necrotic neoplasm. Surgical consultation is suggested.  Moderate volume pelvic ascites.  2.3 cm intramural fibroid in the right posterior uterine body.  MR abdomen wo and w contrast.  02/26/21  IMPRESSION: 1. Large complex enhancing mass in the left abdomen consistent with malignancy. This mass is associated with peripheral nodular enhancement and adjacent soft tissue components superomedially. As this mass appears to be separate from the left ovary, the primary differential considerations are malignant GIST (high risk for patient's with NF-1) and primary peritoneal serous carcinoma. 2. No evidence of bowel ureteral obstruction. 3. Small volume of ascites.    Allergies [name], CNA; 03/05/2021 9:06 AM) Latex Exam Gloves *MEDICAL DEVICES AND SUPPLIES*   Allergies Reconciled    Medication History [name], CNA; 03/05/2021 9:06 AM) oxyCODONE HCl  (5MG  Tablet, Oral) Active. oxyCODONE-Acetaminophen  (5-325MG  Tablet, Oral) Active. traMADol HCl  (50MG  Tablet, Oral) Active. Tamoxifen Citrate  (20MG  Tablet, Oral) Active. Medications Reconciled     Review of Systems [name] MD; 03/05/2021 10:48 AM) All other systems negative  Vitals ([name] CNA; 03/05/2021 9:07 AM) 03/05/2021 9:06 AM Weight: 140.5 lb   Height: 62 in  Body Surface Area: 1.65 m   Body Mass Index: 25.7 kg/m   Temp.: 97.3 F    Pulse: 101 (Regular)    P.OX: 100% (Room air) BP: 126/86(Sitting, Left Arm, Standard)       Physical Exam [name] MD; 03/05/2021 10:49 AM) General Mental Status - Alert. General Appearance - Consistent with stated age. Hydration - Well hydrated. Voice - Normal.  Head and Neck Head - normocephalic, atraumatic with no lesions or palpable masses. Trachea - midline. Thyroid Gland Characteristics - normal size and consistency.  Eye Eyeball - Bilateral - Extraocular movements intact. Sclera/Conjunctiva - Bilateral - No  scleral icterus.  Chest and Lung Exam Chest and lung exam reveals  - quiet, even and easy respiratory effort with no use of accessory muscles and on auscultation, normal breath sounds, no adventitious sounds and normal vocal resonance. Inspection Chest Wall - Normal. Back - normal.  Cardiovascular Cardiovascular examination reveals  - normal heart sounds, regular rate and rhythm with no murmurs and normal pedal pulses bilaterally.  Abdomen Inspection Inspection of the abdomen reveals - No Hernias. Palpation/Percussion Palpation and Percussion of the abdomen reveal - Soft, Non Tender, No Rebound tenderness, No Rigidity (guarding) and No hepatosplenomegaly. Abdominal Mass Palpable - Location - Note: there is a large mass around 15 cm in the left abdomen. It is firm and mobile. Auscultation Auscultation of the abdomen reveals - Bowel sounds normal.  Neurologic Neurologic evaluation reveals  - alert and oriented x 3 with no impairment of recent or remote memory. Mental Status - Normal.  Musculoskeletal Global Assessment  - Note:  no gross deformities.  Normal Exam - Left - Upper Extremity Strength Normal and Lower Extremity Strength Normal. Normal Exam - Right - Upper Extremity Strength Normal and Lower Extremity Strength Normal.  Lymphatic Head & Neck  General Head & Neck Lymphatics: Bilateral - Description - Normal. Axillary  General Axillary Region: Bilateral - Description - Normal. Tenderness - Non Tender. Femoral & Inguinal  Generalized Femoral &  Inguinal Lymphatics: Bilateral - Description - No Generalized lymphadenopathy.    Assessment & Plan [name] MD; 03/05/2021 10:55 AM) ABDOMINAL MASS (R19.00) Impression: Given imaging appearance and NF-1, this is most likely a GIST. I think she may have bled into it and this is likely when she started having pain.  Will plan ex lap and resection. This will likely require a bowel resection, but the scan is not clear which  segment will be removed. I anticipate a small bowel resection, but will do bowel prep in case colon resection is needed.  I reviewed risks of surgery including bleeding, infection, damage to adjacent structures, heart or lung issues, prolonged hospitalization, possible need for additional surgeries or procedures. She understands and wishes to proceed. I would not plan to do biopsy up front as I am concerned about seeding the abdominal wall. Also, I do not think it will change our plan. Current Plans You are being scheduled for surgery - Our schedulers will call you.   You should hear from our office's scheduling department within 5 working days about the location, date, and time of surgery.  We try to make accommodations for patient's preferences in scheduling surgery, but sometimes the OR schedule or the surgeon's schedule prevents [place] from making those accommodations.  If you have not heard from our office [phone]) in 5 working days, call the office and ask for your surgeon's nurse.  If you have other questions about your diagnosis, plan, or surgery, call the office and ask for your surgeon's nurse.  Pt Education - [facility] Open Abdominal Surgery HCI Pt Education - [facility] Colon Bowel Prep 2018 ERAS/Miralax/Antibiotics Started Neomycin Sulfate 500 MG Oral Tablet, 2 (two) Tablet SEE NOTE, #6, 03/05/2021, No Refill. Local Order: Pharmacist Notes: TAKE TWO TABLETS AT 2 PM, 3 PM, AND 10 PM THE DAY PRIOR TO SURGERY Started metroNIDAZOLE 500 MG Oral Tablet, 2 (two) Tablet three times daily, #6, 1 day starting 03/05/2021, No Refill. Local Order: Pharmacist Notes: Pharmacy Instructions: Take 2 tablets at 2pm, 3pm, and 10pm the day prior to your colon operation. IRON DEFICIENCY ANEMIA (D50.9) Impression: Likely related to intraluminal bleeding from tumor. I will ask Dr. Lindi Adie if he wants [place] to do iron infusion while pt is in the hospital. TACHYCARDIA (R00.0) Impression: Will keep patient on home  metoprolol.

## 2021-04-02 NOTE — Progress Notes (Signed)
PCP - Dorothy Puffer PA Cardiologist - Dr. Alfonzo Beers  Chest x-ray - Not indicated EKG - 11/21/20 Stress Test - Denies ECHO - 04/30/2010 Cardiac Cath - Denies  Sleep Study - Denies  DM - Denies  ERAS Protcol -Yes PRE-SURGERY Ensure given   COVID TEST- 04/02/21  Anesthesia review: Yes Cardiac history of tachycardia  Patient denies shortness of breath, fever, cough and chest pain at PAT appointment   All instructions explained to the patient, with a verbal understanding of the material. Patient agrees to go over the instructions while at home for a better understanding. Patient also instructed to wear a mask in public after being tested for COVID-19. The opportunity to ask questions was provided.

## 2021-04-02 NOTE — Progress Notes (Signed)
Anesthesia Chart Review:   Case: [id number] Date/Time: 04/03/21 0945   Procedures:      EXPLORATORY LAPAROTOMY     RESECTION OF ABDOMINAL MASS, BOWEL RESECTION   Anesthesia type: General   Pre-op diagnosis: ABDOMINAL MASS   Location: [facility] OR ROOM 09 / [place] [facility]   Surgeons: [name], MD      Special Needs: General anesthesia and TAP block, ERAS, Supine, harmonic scalpel, Latex allergy, Urgent  DISCUSSION: Patient is a 45 year old female scheduled for the above procedure. CT imaging from ED visit 02/17/21 showed a left lower abdominal mass measuring 14.6 x 15.7 x 11.1 cm, possibly arising from the left ovary or within the mesentery. GYN-ONC saw as out-patient by US showed mass separate from the ovary, and referred to general surgery. Differential considerations on MRI imaging included malignant GIST (high risk for patient's with NF-1) and primary peritoneal serous carcinoma.  History includes never smoker, neurofibromatosis, tachycardia, chest pain history (negaitve stress echo 2011), mitral regurgitation (trace MR/TR 2011 echo), migraines, anemia, breast cancer (s/p left breast lumpectomy for invasive ductal carcinoma and DCIS, negative axillary LN x 7 05/07/2018, s/p radiation; s/p bilateral breast mastopexy 09/05/2020), cholecystectomy (01/04/2015).  04/02/21 presurgical COVID-19 test is in process. Anesthesia team to evaluate on the day of surgery. For urine pregnancy test on the day of surgery. 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   VS: BP 102/64   Pulse 81   Temp 36.8 C (Oral)   Resp 18   Ht 5\' 2"  (1.575 m)   Wt 63 kg   LMP 09/11/2020 (Approximate)   SpO2 100%   BMI 25.42 kg/m    PROVIDERS: Osie Cheeks, PA-C is PCP  - Mertie Moores, MD is cardiologist. Last visit 08/16/20 with Ermalinda Barrios, PA-C.  Hypertension controlled on Toprol.  Palpitations/ST managed on Toprol.  HLD managed by primary care.  1 year follow-up recommended. Nicholas Lose,  MD is HEM-ONC [name], MD is RAD-ONC - Everitt Amber, MD is GYN-ONC. Transvaginal US done to better determine location of abdominal mass and showed mass separate from the left ovary, so surgical oncology referral recommended.    LABS: Labs reviewed: Acceptable for surgery. H/H 10.6/34.3, but consistent with results from June 2022.  (all labs ordered are listed, but only abnormal results are displayed)  Labs Reviewed  CBC WITH DIFFERENTIAL/PLATELET - Abnormal; Notable for the following components:      Result Value   Hemoglobin 10.6 (*)    HCT 34.3 (*)    Platelets 602 (*)    Abs Immature Granulocytes 0.12 (*)    All other components within normal limits  COMPREHENSIVE METABOLIC PANEL - Abnormal; Notable for the following components:   Calcium 8.8 (*)    Total Protein 6.3 (*)    Albumin 3.4 (*)    All other components within normal limits  SARS CORONAVIRUS 2 (TAT 6-24 HRS)  PROTIME-INR  TYPE AND SCREEN  PREPARE RBC (CROSSMATCH)     IMAGES: Digital Mammogram 03/21/21: IMPRESSION: 1. No mammographic evidence of malignancy involving either breast. 2. Expected post lumpectomy changes in the LEFT breast and expected post surgical changes in both breasts related to the interval reduction mammoplasty.  MRI Abd 02/24/21: IMPRESSION: 1. Large complex enhancing mass in the left abdomen consistent with malignancy. This mass is associated with peripheral nodular enhancement and adjacent soft tissue components superomedially. As this mass appears to be separate from the left ovary, the primary differential considerations are  malignant GIST (high risk for patient's with NF-1) and primary peritoneal serous carcinoma. 2. No evidence of bowel ureteral obstruction. 3. Small volume of ascites.  US Pelvic 02/19/21: IMPRESSION: - 14.8 cm complex cystic mass in the left mid abdomen, separate/distinct from the left ovary. This remains suspicious for intra-abdominal cystic/necrotic  neoplasm. Surgical consultation is suggested. - Moderate volume pelvic ascites. - 2.3 cm intramural fibroid in the right posterior uterine body.  MRI Brain 12/02/19: IMPRESSION: No intracranial metastatic disease or lesions characteristic of neurofibromatosis identified. Unremarkable MRI of the brain.     EKG: 11/21/20: Normal sinus rhythm Normal ECG No significant change since last tracing Confirmed by Ena Dawley [phone]) on 11/22/2020 11:19:00 AM   CV: Echo 04/30/10: Impression: Normal LV function. Trace mitral regurgitation. Trace tricuspid regurgitation.  Stress echo 04/30/10: Normal stress echo.   Past Medical History:  Diagnosis Date   Allergy    Anemia    Anxiety    Breast cancer ([place]) 05/07/2018   left invasive ductal    Cancer ([facility])    Chest pain    Depression    Family history of breast cancer    Fibromyalgia    neurofibromatosis   H/O: depression    Hx of migraines    Mitral regurgitation    mild per echo EF 55-60%   Neurofibromatosis ([place])    Palpitations    Personal history of radiation therapy    Tachycardia    Hx. of   Tachycardia    Wears contact lenses     Past Surgical History:  Procedure Laterality Date   BREAST BIOPSY     BREAST LUMPECTOMY Left 05/07/2018   Invasive ductal    BREAST LUMPECTOMY WITH RADIOACTIVE SEED AND SENTINEL LYMPH NODE BIOPSY Left 05/07/2018   Procedure: BREAST LUMPECTOMY WITH RADIOACTIVE SEED AND SENTINEL LYMPH NODE BIOPSY;  Surgeon: Rolm Bookbinder, MD;  Location: [place];  Service: General;  Laterality: Left;   CESAREAN SECTION     CHOLECYSTECTOMY N/A 01/04/2015   Procedure: LAPAROSCOPIC CHOLECYSTECTOMY WITH INTRAOPERATIVE CHOLANGIOGRAM;  Surgeon: Erroll Luna, MD;  Location: [place];  Service: General;  Laterality: N/A;   COLONOSCOPY     ESOPHAGOGASTRODUODENOSCOPY     MASTOPEXY Bilateral 09/05/2020   Procedure: BILATERAL BREAST MASTOPEXY;  Surgeon: Irene Limbo,  MD;  Location: [place];  Service: Plastics;  Laterality: Bilateral;   WISDOM TOOTH EXTRACTION      MEDICATIONS:  ALPRAZolam (XANAX) 0.5 MG tablet   cetirizine (ZYRTEC) 10 MG tablet   gabapentin (NEURONTIN) 100 MG capsule   ibuprofen (ADVIL) 200 MG tablet   metoprolol succinate (TOPROL-XL) 50 MG 24 hr tablet   metroNIDAZOLE (FLAGYL) 500 MG tablet   Multiple Vitamin (MULTIVITAMIN) tablet   neomycin (MYCIFRADIN) 500 MG tablet   ondansetron (ZOFRAN ODT) 4 MG disintegrating tablet   ondansetron (ZOFRAN-ODT) 4 MG disintegrating tablet   oxyCODONE (OXY IR/ROXICODONE) 5 MG immediate release tablet   oxyCODONE-acetaminophen (PERCOCET) 5-325 MG tablet   oxyCODONE-acetaminophen (PERCOCET/ROXICET) 5-325 MG tablet   propranolol (INDERAL) 10 MG tablet   senna-docusate (SENOKOT-S) 8.6-50 MG tablet   tamoxifen (NOLVADEX) 20 MG tablet   traMADol (ULTRAM) 50 MG tablet   venlafaxine XR (EFFEXOR-XR) 75 MG 24 hr capsule   No current facility-administered medications for this encounter.    Myra Gianotti, PA-C Surgical Short Stay/Anesthesiology [facility] Phone [phone] [facility] Phone [phone] 04/02/2021 3:56 PM

## 2021-04-03 ENCOUNTER — Encounter (HOSPITAL_COMMUNITY): Payer: Self-pay | Admitting: General Surgery

## 2021-04-03 ENCOUNTER — Inpatient Hospital Stay (HOSPITAL_COMMUNITY)
Admission: RE | Admit: 2021-04-03 | Discharge: 2021-04-10 | DRG: 330 | Disposition: A | Discharge status: Home / Self Care | Payer: 59 | Attending: General Surgery | Admitting: General Surgery

## 2021-04-03 ENCOUNTER — Other Ambulatory Visit: Payer: Self-pay

## 2021-04-03 ENCOUNTER — Inpatient Hospital Stay (HOSPITAL_COMMUNITY): Payer: 59

## 2021-04-03 ENCOUNTER — Inpatient Hospital Stay (HOSPITAL_COMMUNITY): Payer: 59 | Admitting: Vascular Surgery

## 2021-04-03 ENCOUNTER — Encounter (HOSPITAL_COMMUNITY)
Admission: RE | Disposition: A | Discharge status: Home / Self Care | Payer: Self-pay | Source: Home / Self Care | Attending: General Surgery

## 2021-04-03 DIAGNOSIS — Z20822 Contact with and (suspected) exposure to covid-19: Secondary | ICD-10-CM | POA: Diagnosis present

## 2021-04-03 DIAGNOSIS — D62 Acute posthemorrhagic anemia: Secondary | ICD-10-CM | POA: Diagnosis present

## 2021-04-03 DIAGNOSIS — Z853 Personal history of malignant neoplasm of breast: Secondary | ICD-10-CM

## 2021-04-03 DIAGNOSIS — G8918 Other acute postprocedural pain: Secondary | ICD-10-CM | POA: Diagnosis not present

## 2021-04-03 DIAGNOSIS — Q8500 Neurofibromatosis, unspecified: Secondary | ICD-10-CM

## 2021-04-03 DIAGNOSIS — C49A3 Gastrointestinal stromal tumor of small intestine: Secondary | ICD-10-CM | POA: Diagnosis not present

## 2021-04-03 DIAGNOSIS — R1900 Intra-abdominal and pelvic swelling, mass and lump, unspecified site: Secondary | ICD-10-CM

## 2021-04-03 DIAGNOSIS — D490 Neoplasm of unspecified behavior of digestive system: Secondary | ICD-10-CM | POA: Diagnosis not present

## 2021-04-03 DIAGNOSIS — I340 Nonrheumatic mitral (valve) insufficiency: Secondary | ICD-10-CM | POA: Diagnosis not present

## 2021-04-03 DIAGNOSIS — D509 Iron deficiency anemia, unspecified: Secondary | ICD-10-CM | POA: Diagnosis not present

## 2021-04-03 HISTORY — PX: LAPAROTOMY: SHX154

## 2021-04-03 HISTORY — PX: OMENTECTOMY: SHX5985

## 2021-04-03 HISTORY — PX: BOWEL RESECTION: SHX1257

## 2021-04-03 LAB — ABO/RH: ABO/RH(D): A POS

## 2021-04-03 LAB — POCT PREGNANCY, URINE: Preg Test, Ur: NEGATIVE

## 2021-04-03 LAB — PREPARE RBC (CROSSMATCH)

## 2021-04-03 SURGERY — LAPAROTOMY, EXPLORATORY
Anesthesia: General | Site: Abdomen

## 2021-04-03 MED ORDER — PROPOFOL 10 MG/ML IV BOLUS
INTRAVENOUS | Status: AC
  Filled 2021-04-03: qty 20

## 2021-04-03 MED ORDER — DIPHENHYDRAMINE HCL 12.5 MG/5ML PO ELIX: 12.5000 mg | ORAL_SOLUTION | Freq: Four times a day (QID) | ORAL | Status: DC | PRN

## 2021-04-03 MED ORDER — PROCHLORPERAZINE MALEATE 10 MG PO TABS
10.0000 mg | ORAL_TABLET | Freq: Four times a day (QID) | ORAL | Status: DC | PRN
  Filled 2021-04-03: qty 1

## 2021-04-03 MED ORDER — HYDROMORPHONE 1 MG/ML IV SOLN
INTRAVENOUS | Status: DC
Start: 2021-04-03 — End: 2021-04-09
  Administered 2021-04-03: 0.9 mg via INTRAVENOUS
  Administered 2021-04-04: 0.3 mg via INTRAVENOUS
  Administered 2021-04-04: 0.6 mg via INTRAVENOUS
  Administered 2021-04-04: 1.2 mg via INTRAVENOUS
  Administered 2021-04-04: 0.3 mg via INTRAVENOUS
  Administered 2021-04-04 (×2): 0.6 mg via INTRAVENOUS
  Administered 2021-04-05: 0.3 mg via INTRAVENOUS
  Administered 2021-04-05: 1.8 mg via INTRAVENOUS
  Administered 2021-04-05: 0.9 mg via INTRAVENOUS
  Administered 2021-04-06 (×2): 0.3 mg via INTRAVENOUS
  Administered 2021-04-06: 0 mg via INTRAVENOUS
  Administered 2021-04-07: 0.3 mg via INTRAVENOUS
  Administered 2021-04-07: 0 mg via INTRAVENOUS
  Administered 2021-04-07: 0.6 mg via INTRAVENOUS
  Administered 2021-04-08: 0.3 mg via INTRAVENOUS
  Administered 2021-04-08 (×3): 0.9 mg via INTRAVENOUS
  Administered 2021-04-09: 0 mg via INTRAVENOUS
  Filled 2021-04-03: qty 30

## 2021-04-03 MED ORDER — ACETAMINOPHEN 650 MG RE SUPP: 650.0000 mg | Freq: Four times a day (QID) | RECTAL | Status: DC | PRN

## 2021-04-03 MED ORDER — CHLORHEXIDINE GLUCONATE CLOTH 2 % EX PADS: 6.0000 | MEDICATED_PAD | Freq: Once | CUTANEOUS | Status: DC

## 2021-04-03 MED ORDER — METOPROLOL SUCCINATE ER 25 MG PO TB24
50.0000 mg | ORAL_TABLET | Freq: Every day | ORAL | Status: DC
  Administered 2021-04-05 – 2021-04-10 (×5): 50 mg via ORAL
  Filled 2021-04-03 (×6): qty 2

## 2021-04-03 MED ORDER — PHENYLEPHRINE HCL-NACL 10-0.9 MG/250ML-% IV SOLN
INTRAVENOUS | Status: DC | PRN
  Administered 2021-04-03: 20 ug/min via INTRAVENOUS

## 2021-04-03 MED ORDER — OXYCODONE HCL 5 MG PO TABS: 5.0000 mg | ORAL_TABLET | Freq: Once | ORAL | Status: DC | PRN

## 2021-04-03 MED ORDER — DIPHENHYDRAMINE HCL 50 MG/ML IJ SOLN: 12.5000 mg | Freq: Four times a day (QID) | INTRAMUSCULAR | Status: DC | PRN

## 2021-04-03 MED ORDER — KETOROLAC TROMETHAMINE 30 MG/ML IJ SOLN: 30.0000 mg | Freq: Four times a day (QID) | INTRAMUSCULAR | Status: AC | PRN

## 2021-04-03 MED ORDER — SODIUM CHLORIDE 0.9% FLUSH: 9.0000 mL | INTRAVENOUS | Status: DC | PRN

## 2021-04-03 MED ORDER — CHLORHEXIDINE GLUCONATE 0.12 % MT SOLN
15.0000 mL | OROMUCOSAL | Status: AC
  Administered 2021-04-03: 15 mL via OROMUCOSAL
  Filled 2021-04-03: qty 15

## 2021-04-03 MED ORDER — SENNOSIDES-DOCUSATE SODIUM 8.6-50 MG PO TABS: 1.0000 | ORAL_TABLET | Freq: Every day | ORAL | Status: DC | PRN

## 2021-04-03 MED ORDER — KETOROLAC TROMETHAMINE 30 MG/ML IJ SOLN
30.0000 mg | Freq: Four times a day (QID) | INTRAMUSCULAR | Status: AC
Start: 2021-04-03 — End: 2021-04-04
  Administered 2021-04-03 – 2021-04-04 (×4): 30 mg via INTRAVENOUS
  Filled 2021-04-03 (×4): qty 1

## 2021-04-03 MED ORDER — ONDANSETRON HCL 4 MG/2ML IJ SOLN: 4.0000 mg | Freq: Four times a day (QID) | INTRAMUSCULAR | Status: DC | PRN

## 2021-04-03 MED ORDER — ROCURONIUM BROMIDE 10 MG/ML (PF) SYRINGE
PREFILLED_SYRINGE | INTRAVENOUS | Status: AC
  Filled 2021-04-03: qty 10

## 2021-04-03 MED ORDER — GABAPENTIN 100 MG PO CAPS: 100.0000 mg | ORAL_CAPSULE | Freq: Every evening | ORAL | Status: DC | PRN

## 2021-04-03 MED ORDER — ENSURE PRE-SURGERY PO LIQD: 592.0000 mL | Freq: Once | ORAL | Status: DC

## 2021-04-03 MED ORDER — FENTANYL CITRATE (PF) 100 MCG/2ML IJ SOLN
INTRAMUSCULAR | Status: DC | PRN
  Administered 2021-04-03 (×5): 50 ug via INTRAVENOUS

## 2021-04-03 MED ORDER — ALPRAZOLAM 0.25 MG PO TABS: 0.2500 mg | ORAL_TABLET | Freq: Two times a day (BID) | ORAL | Status: DC | PRN

## 2021-04-03 MED ORDER — ACETAMINOPHEN 325 MG PO TABS
650.0000 mg | ORAL_TABLET | Freq: Four times a day (QID) | ORAL | Status: DC | PRN
  Administered 2021-04-07: 650 mg via ORAL
  Filled 2021-04-03: qty 2

## 2021-04-03 MED ORDER — PROPRANOLOL HCL 20 MG PO TABS: 10.0000 mg | ORAL_TABLET | Freq: Four times a day (QID) | ORAL | Status: DC | PRN

## 2021-04-03 MED ORDER — LIDOCAINE HCL (CARDIAC) PF 100 MG/5ML IV SOSY
PREFILLED_SYRINGE | INTRAVENOUS | Status: DC | PRN
  Administered 2021-04-03: 50 mg via INTRAVENOUS

## 2021-04-03 MED ORDER — BUPIVACAINE LIPOSOME 1.3 % IJ SUSP
INTRAMUSCULAR | Status: DC | PRN
  Administered 2021-04-03 (×2): 5 mL via PERINEURAL

## 2021-04-03 MED ORDER — KCL-LACTATED RINGERS-D5W 20 MEQ/L IV SOLN
INTRAVENOUS | Status: DC
  Filled 2021-04-03 (×2): qty 1000

## 2021-04-03 MED ORDER — ROCURONIUM BROMIDE 100 MG/10ML IV SOLN
INTRAVENOUS | Status: DC | PRN
  Administered 2021-04-03: 60 mg via INTRAVENOUS
  Administered 2021-04-03: 40 mg via INTRAVENOUS

## 2021-04-03 MED ORDER — MIDAZOLAM HCL 2 MG/2ML IJ SOLN
INTRAMUSCULAR | Status: AC
  Filled 2021-04-03: qty 2

## 2021-04-03 MED ORDER — ONDANSETRON 4 MG PO TBDP: 4.0000 mg | ORAL_TABLET | Freq: Three times a day (TID) | ORAL | Status: DC | PRN

## 2021-04-03 MED ORDER — MIDAZOLAM HCL 5 MG/5ML IJ SOLN
INTRAMUSCULAR | Status: DC | PRN
  Administered 2021-04-03: 2 mg via INTRAVENOUS

## 2021-04-03 MED ORDER — ONDANSETRON HCL 4 MG/2ML IJ SOLN
INTRAMUSCULAR | Status: AC
  Administered 2021-04-03: 4 mg
  Filled 2021-04-03: qty 2

## 2021-04-03 MED ORDER — STERILE WATER FOR IRRIGATION IR SOLN
Status: DC | PRN
  Administered 2021-04-03: 2000 mL

## 2021-04-03 MED ORDER — MELATONIN 3 MG PO TABS: 3.0000 mg | ORAL_TABLET | Freq: Every evening | ORAL | Status: DC | PRN

## 2021-04-03 MED ORDER — ACETAMINOPHEN 500 MG PO TABS
1000.0000 mg | ORAL_TABLET | ORAL | Status: AC
  Administered 2021-04-03: 1000 mg via ORAL
  Filled 2021-04-03: qty 2

## 2021-04-03 MED ORDER — DEXAMETHASONE SODIUM PHOSPHATE 10 MG/ML IJ SOLN
INTRAMUSCULAR | Status: AC
  Filled 2021-04-03: qty 1

## 2021-04-03 MED ORDER — CEFAZOLIN SODIUM-DEXTROSE 2-4 GM/100ML-% IV SOLN
2.0000 g | INTRAVENOUS | Status: AC
  Administered 2021-04-03: 2 g via INTRAVENOUS
  Filled 2021-04-03: qty 100

## 2021-04-03 MED ORDER — ONDANSETRON 4 MG PO TBDP
4.0000 mg | ORAL_TABLET | Freq: Four times a day (QID) | ORAL | Status: DC | PRN
  Administered 2021-04-03 – 2021-04-07 (×2): 4 mg via ORAL
  Filled 2021-04-03 (×2): qty 1

## 2021-04-03 MED ORDER — PROCHLORPERAZINE EDISYLATE 10 MG/2ML IJ SOLN
5.0000 mg | Freq: Four times a day (QID) | INTRAMUSCULAR | Status: DC | PRN
  Administered 2021-04-03: 10 mg via INTRAVENOUS
  Filled 2021-04-03: qty 2

## 2021-04-03 MED ORDER — ENOXAPARIN SODIUM 40 MG/0.4ML IJ SOSY: 40.0000 mg | PREFILLED_SYRINGE | INTRAMUSCULAR | Status: DC

## 2021-04-03 MED ORDER — DEXMEDETOMIDINE (PRECEDEX) IN NS 20 MCG/5ML (4 MCG/ML) IV SYRINGE
PREFILLED_SYRINGE | INTRAVENOUS | Status: DC | PRN
  Administered 2021-04-03 (×2): 8 ug via INTRAVENOUS

## 2021-04-03 MED ORDER — METHOCARBAMOL 500 MG PO TABS: 500.0000 mg | ORAL_TABLET | Freq: Four times a day (QID) | ORAL | Status: DC | PRN

## 2021-04-03 MED ORDER — NALOXONE HCL 0.4 MG/ML IJ SOLN: 0.4000 mg | INTRAMUSCULAR | Status: DC | PRN

## 2021-04-03 MED ORDER — ONDANSETRON HCL 4 MG/2ML IJ SOLN
INTRAMUSCULAR | Status: AC
  Filled 2021-04-03: qty 2

## 2021-04-03 MED ORDER — PHENYLEPHRINE 40 MCG/ML (10ML) SYRINGE FOR IV PUSH (FOR BLOOD PRESSURE SUPPORT)
PREFILLED_SYRINGE | INTRAVENOUS | Status: DC | PRN
  Administered 2021-04-03: 80 ug via INTRAVENOUS

## 2021-04-03 MED ORDER — FENTANYL CITRATE (PF) 250 MCG/5ML IJ SOLN
INTRAMUSCULAR | Status: AC
  Filled 2021-04-03: qty 5

## 2021-04-03 MED ORDER — CEFAZOLIN SODIUM-DEXTROSE 2-4 GM/100ML-% IV SOLN
2.0000 g | Freq: Three times a day (TID) | INTRAVENOUS | Status: AC
  Administered 2021-04-03: 2 g via INTRAVENOUS
  Filled 2021-04-03: qty 100

## 2021-04-03 MED ORDER — TRAMADOL HCL 50 MG PO TABS
50.0000 mg | ORAL_TABLET | Freq: Four times a day (QID) | ORAL | Status: DC | PRN
Start: 2021-04-03 — End: 2021-04-10
  Administered 2021-04-08 – 2021-04-10 (×3): 50 mg via ORAL
  Filled 2021-04-03 (×3): qty 1

## 2021-04-03 MED ORDER — BUPIVACAINE-EPINEPHRINE (PF) 0.5% -1:200000 IJ SOLN
INTRAMUSCULAR | Status: DC | PRN
  Administered 2021-04-03 (×2): 10 mL via PERINEURAL

## 2021-04-03 MED ORDER — SUGAMMADEX SODIUM 200 MG/2ML IV SOLN
INTRAVENOUS | Status: DC | PRN
  Administered 2021-04-03: 200 mg via INTRAVENOUS

## 2021-04-03 MED ORDER — LIDOCAINE 2% (20 MG/ML) 5 ML SYRINGE
INTRAMUSCULAR | Status: AC
  Filled 2021-04-03: qty 5

## 2021-04-03 MED ORDER — ONDANSETRON HCL 4 MG/2ML IJ SOLN
4.0000 mg | Freq: Four times a day (QID) | INTRAMUSCULAR | Status: DC | PRN
  Administered 2021-04-05 – 2021-04-09 (×4): 4 mg via INTRAVENOUS
  Filled 2021-04-03 (×4): qty 2

## 2021-04-03 MED ORDER — DEXAMETHASONE SODIUM PHOSPHATE 10 MG/ML IJ SOLN
INTRAMUSCULAR | Status: DC | PRN
  Administered 2021-04-03: 5 mg via INTRAVENOUS

## 2021-04-03 MED ORDER — HYDROMORPHONE HCL 1 MG/ML IJ SOLN
INTRAMUSCULAR | Status: AC
  Filled 2021-04-03: qty 1

## 2021-04-03 MED ORDER — OXYCODONE HCL 5 MG PO TABS
5.0000 mg | ORAL_TABLET | ORAL | Status: DC | PRN
Start: 2021-04-03 — End: 2021-04-08
  Administered 2021-04-03: 5 mg via ORAL
  Filled 2021-04-03: qty 1

## 2021-04-03 MED ORDER — 0.9 % SODIUM CHLORIDE (POUR BTL) OPTIME
TOPICAL | Status: DC | PRN
  Administered 2021-04-03 (×3): 1000 mL

## 2021-04-03 MED ORDER — ALBUMIN HUMAN 5 % IV SOLN: INTRAVENOUS | Status: DC | PRN

## 2021-04-03 MED ORDER — LACTATED RINGERS IV SOLN: INTRAVENOUS | Status: DC

## 2021-04-03 MED ORDER — PROPOFOL 10 MG/ML IV BOLUS
INTRAVENOUS | Status: DC | PRN
  Administered 2021-04-03: 140 mg via INTRAVENOUS

## 2021-04-03 MED ORDER — LORATADINE 10 MG PO TABS
10.0000 mg | ORAL_TABLET | Freq: Every day | ORAL | Status: DC
  Administered 2021-04-03 – 2021-04-10 (×8): 10 mg via ORAL
  Filled 2021-04-03 (×8): qty 1

## 2021-04-03 MED ORDER — VENLAFAXINE HCL ER 75 MG PO CP24
75.0000 mg | ORAL_CAPSULE | Freq: Every day | ORAL | Status: DC
  Administered 2021-04-04 – 2021-04-10 (×7): 75 mg via ORAL
  Filled 2021-04-03 (×7): qty 1

## 2021-04-03 MED ORDER — OXYCODONE HCL 5 MG/5ML PO SOLN
5.0000 mg | Freq: Once | ORAL | Status: DC | PRN
Start: 2021-04-03 — End: 2021-04-03

## 2021-04-03 MED ORDER — ENSURE PRE-SURGERY PO LIQD: 296.0000 mL | Freq: Once | ORAL | Status: DC

## 2021-04-03 MED ORDER — ONDANSETRON HCL 4 MG/2ML IJ SOLN
INTRAMUSCULAR | Status: DC | PRN
  Administered 2021-04-03: 4 mg via INTRAVENOUS

## 2021-04-03 MED ORDER — FENTANYL CITRATE (PF) 100 MCG/2ML IJ SOLN
INTRAMUSCULAR | Status: AC
  Filled 2021-04-03: qty 2

## 2021-04-03 MED ORDER — HYDROMORPHONE HCL 1 MG/ML IJ SOLN
0.2500 mg | INTRAMUSCULAR | Status: DC | PRN
  Administered 2021-04-03 (×2): 0.5 mg via INTRAVENOUS

## 2021-04-03 SURGICAL SUPPLY — 53 items
APL PRP STRL LF DISP 70% ISPRP (MISCELLANEOUS) ×1
BAG COUNTER SPONGE SURGICOUNT (BAG) ×2 IMPLANT
BLADE CLIPPER SURG (BLADE) ×1 IMPLANT
CANISTER SUCT 3000ML PPV (MISCELLANEOUS) ×2 IMPLANT
CHLORAPREP W/TINT 26 (MISCELLANEOUS) ×2 IMPLANT
CLIP VESOCCLUDE LG 6/CT (CLIP) ×1 IMPLANT
CLIP VESOLOCK LG 6/CT PURPLE (CLIP) ×1 IMPLANT
CLIP VESOLOCK MED 6/CT (CLIP) ×1 IMPLANT
CLIP VESOLOCK MED LG 6/CT (CLIP) ×1 IMPLANT
COVER SURGICAL LIGHT HANDLE (MISCELLANEOUS) ×2 IMPLANT
DERMABOND ADVANCED (GAUZE/BANDAGES/DRESSINGS) ×1
DERMABOND ADVANCED .7 DNX12 (GAUZE/BANDAGES/DRESSINGS) IMPLANT
DRAPE LAPAROSCOPIC ABDOMINAL (DRAPES) ×2 IMPLANT
DRAPE WARM FLUID 44X44 (DRAPES) ×2 IMPLANT
DRSG OPSITE POSTOP 4X10 (GAUZE/BANDAGES/DRESSINGS) ×1 IMPLANT
ELECT BLADE 4.0 EZ CLEAN MEGAD (MISCELLANEOUS) ×2
ELECT BLADE 6.5 EXT (BLADE) ×1 IMPLANT
ELECT CAUTERY BLADE 6.4 (BLADE) ×3 IMPLANT
ELECT REM PT RETURN 9FT ADLT (ELECTROSURGICAL) ×2
ELECTRODE BLDE 4.0 EZ CLN MEGD (MISCELLANEOUS) IMPLANT
ELECTRODE REM PT RTRN 9FT ADLT (ELECTROSURGICAL) ×1 IMPLANT
GOWN STRL REUS W/ TWL LRG LVL3 (GOWN DISPOSABLE) ×2 IMPLANT
GOWN STRL REUS W/TWL 2XL LVL3 (GOWN DISPOSABLE) ×2 IMPLANT
GOWN STRL REUS W/TWL LRG LVL3 (GOWN DISPOSABLE) ×4
HANDLE SUCTION POOLE (INSTRUMENTS) ×1 IMPLANT
KIT BASIN OR (CUSTOM PROCEDURE TRAY) ×2 IMPLANT
KIT TURNOVER KIT B (KITS) ×2 IMPLANT
NS IRRIG 1000ML POUR BTL (IV SOLUTION) ×4 IMPLANT
PACK GENERAL/GYN (CUSTOM PROCEDURE TRAY) ×2 IMPLANT
PAD ARMBOARD 7.5X6 YLW CONV (MISCELLANEOUS) ×2 IMPLANT
PENCIL SMOKE EVACUATOR (MISCELLANEOUS) ×2 IMPLANT
RELOAD PROXIMATE 75MM BLUE (ENDOMECHANICALS) ×6 IMPLANT
RELOAD STAPLE 75 3.8 BLU REG (ENDOMECHANICALS) IMPLANT
RETRACTOR WOUND ALXS 34CM XLRG (MISCELLANEOUS) IMPLANT
RTRCTR WOUND ALEXIS 34CM XLRG (MISCELLANEOUS) ×2
SHEARS FOC LG CVD HARMONIC 17C (MISCELLANEOUS) ×1 IMPLANT
SPONGE T-LAP 18X18 ~~LOC~~+RFID (SPONGE) ×5 IMPLANT
STAPLER PROXIMATE 75MM BLUE (STAPLE) ×1 IMPLANT
STAPLER VISISTAT 35W (STAPLE) ×2 IMPLANT
SUCTION POOLE HANDLE (INSTRUMENTS) ×2
SUT MNCRL AB 4-0 PS2 18 (SUTURE) ×2 IMPLANT
SUT PDS AB 1 TP1 96 (SUTURE) ×2 IMPLANT
SUT PDS AB 3-0 SH 27 (SUTURE) ×2 IMPLANT
SUT VIC AB 2-0 SH 18 (SUTURE) ×2 IMPLANT
SUT VIC AB 3-0 SH 18 (SUTURE) ×2 IMPLANT
SUT VIC AB 3-0 SH 8-18 (SUTURE) ×1 IMPLANT
SUT VICRYL AB 2 0 TIES (SUTURE) ×2 IMPLANT
SUT VICRYL AB 3 0 TIES (SUTURE) ×2 IMPLANT
TOWEL GREEN STERILE (TOWEL DISPOSABLE) ×2 IMPLANT
TOWEL GREEN STERILE FF (TOWEL DISPOSABLE) ×2 IMPLANT
TRAP SPECIMEN MUCUS 40CC (MISCELLANEOUS) ×2 IMPLANT
TRAY FOL W/BAG SLVR 16FR STRL (SET/KITS/TRAYS/PACK) IMPLANT
TRAY FOLEY W/BAG SLVR 16FR LF (SET/KITS/TRAYS/PACK) ×2

## 2021-04-03 NOTE — Interval H&P Note (Signed)
History and Physical Interval Note:  04/03/2021 9:22 AM  Anne Horton  has presented today for surgery, with the diagnosis of ABDOMINAL MASS.  The various methods of treatment have been discussed with the patient and family. After consideration of risks, benefits and other options for treatment, the patient has consented to  Procedure(s): EXPLORATORY LAPAROTOMY (N/A) RESECTION OF ABDOMINAL MASS, POSSIBLE BOWEL RESECTION (N/A) as a surgical intervention.  The patient's history has been reviewed, patient examined, no change in status, stable for surgery.  I have reviewed the patient's chart and labs.  Questions were answered to the patient's satisfaction.     [name]

## 2021-04-03 NOTE — Anesthesia Procedure Notes (Addendum)
Anesthesia Regional Block: TAP block   Pre-Anesthetic Checklist: , timeout performed,  Correct Patient, Correct Site, Correct Laterality,  Correct Procedure, Correct Position, site marked,  Risks and benefits discussed,  Surgical consent,  Pre-op evaluation,  At surgeon's request and post-op pain management  Laterality: Right  Prep: chloraprep       Needles:  Injection technique: Single-shot  Needle Type: Echogenic Needle     Needle Length: 9cm  Needle Gauge: 21     Additional Needles:   Narrative:  Start time: 04/03/2021 9:40 AM End time: 04/03/2021 9:47 AM Injection made incrementally with aspirations every 5 mL.  Performed by: Personally  Anesthesiologist: Albertha Ghee, MD  Additional Notes: Pt tolerated the procedure well.

## 2021-04-03 NOTE — Transfer of Care (Signed)
Immediate Anesthesia Transfer of Care Note  Patient: Anne Horton [facility]  Procedure(s) Performed: EXPLORATORY LAPAROTOMY WITH RESECTION OF ABDOMINAL MASS (Abdomen) SMALL BOWEL RESECTION (Abdomen) OMENTECTOMY (Abdomen)  Patient Location: PACU  Anesthesia Type:General  Level of Consciousness: awake, alert  and patient cooperative  Airway & Oxygen Therapy: Patient Spontanous Breathing and Patient connected to face mask oxygen  Post-op Assessment: Report given to RN, Post -op Vital signs reviewed and stable and Patient moving all extremities X 4  Post vital signs: Reviewed and stable  Last Vitals:  Vitals Value Taken Time  BP    Temp    Pulse    Resp    SpO2      Last Pain:  Vitals:   04/03/21 0834  TempSrc:   PainSc: 0-No pain         Complications: No notable events documented.

## 2021-04-03 NOTE — Op Note (Addendum)
PRE-OPERATIVE DIAGNOSIS: Abdominal mass  POST-OPERATIVE DIAGNOSIS:  Same  PROCEDURE:  Procedure(s): Resection of abdominal mass with small bowel resection and omentectomy.  SURGEON:  Surgeon(s): [name], MD  Assistant: Romana Juniper, MD  ANESTHESIA:   general and TAP block  DRAINS: None  LOCAL MEDICATIONS USED:  OTHER Exparel was placed in the TAP block  SPECIMEN: Pelvic fluid for cytology, small bowel section with multiple abdominal masses, omentum  DISPOSITION OF SPECIMEN:  PATHOLOGY  COUNTS:  YES  DICTATION: .Dragon Dictation  PLAN OF CARE: Admit to inpatient   PATIENT DISPOSITION:  PACU - hemodynamically stable.  FINDINGS:  numerous masses of varying sizes on the jejunum.  Probable GIST.   EBL: 200 mL  PROCEDURE:    Patient was identified in the holding area and then was taken to the operating room where she was placed supine on the operating room table.  Pneumatic compression devices were placed on her calves.  General anesthesia was induced.  A Foley catheter was placed and her right arm was tucked.  Her abdomen was then prepped and draped in sterile fashion.  A timeout was performed according to the surgical safety checklist.  All was correct, we continued.  A midline incision was made around 6 cm above and around 6 cm below the umbilicus.  The subcutaneous tissues were divided with the cautery.  The fascia was also entered with the cautery.  The fascial incision was carried out the length of the skin incision.  The peritoneum was elevated and entered sharply.  This also was opened the length of the incision.  The tumor was immediately visible.  There was omentum that was adherent to the top of it.  There was also free fluid in the pelvis.  The free fluid was suctioned into a specimen trap and sent for cytology.  This was taken down with the harmonic scalpel as well as clamps and ties.  The omentum was stuck laterally to several of the epiploic appendages of the  descending colon.  This came off easily as well with the harmonic scalpel.  The tumor was quite large despite very gentle retraction there was spillage of fluid with removal of the mass from the abdominal cavity.  Once this was elevated it became apparent that it was to the small intestine consistent with a GI stromal tumor.  There were several other tumors immediately visible.  The small bowel was run and there were approximately 28 tumors of varying sizes.  These all appeared to be confined to the jejunum.  A long segment of small bowel incorporating all almost all of the masses was removed.  There was at least 250 cm of small bowel remaining.  Right at the ligament of Treitz there were 2 small tumors approximately 3 mm in size.  These were not able to be resected.  The small bowel was divided with the GIA stapler approximately 10 cm beyond the ligament of Treitz.  The small bowel was then divided after the majority of the small bowel tumors.  There was only one 2 to 3 mm mass beyond the distal resection site.  This was also divided with a GIA 75 stapler.  The mesentery was divided with the harmonic scalpel and clamps and ties.  The tumor was passed off to the back table.  The abdomen was then irrigated with water and saline.  The small bowel was then examined to evaluate for any additional residual tumor.  There was none seen.  The ovaries and  uterus appeared normal for age.  The stomach and liver as well as the spleen also appeared normal.  There is no other gross pathology other than what is mentioned above.  The small bowel was then put back together in a side-to-side, functional end-to-end fashion and peristaltic direction.  The proximal small bowel past the ligament of Treitz was attached to the distal portion of the bowel.  A small opening was made with the cautery.  The 75 mm GIA stapler was used to create the anastomosis.  The defect was then closed with 2 running 3-0 PDS sutures in Wallace fashion.   Anti-tension sutures were placed proximally and distally.  The mesenteric defect was closed on both sides with interrupted 3-0 Vicryl sutures.  There was a portion of omentum that appeared devascularized.  This was taken with the harmonic incorporated into the specimen.  The fascial incision was then closed using running 1 looped PDS suture.  The preliminary lap count was correct prior to doing this.  The skin was then irrigated and closed with 3-0 Vicryl deep sutures and 4-0 Monocryl running subcuticular suture.  The wounds were then cleaned, dried, and dressed with Dermabond and a honeycomb dressing.  The patient was allowed to emerge from anesthesia and was taken to the PACU in stable condition.

## 2021-04-03 NOTE — Anesthesia Postprocedure Evaluation (Signed)
Anesthesia Post Note  Patient: Anne Horton  Procedure(s) Performed: EXPLORATORY LAPAROTOMY WITH RESECTION OF ABDOMINAL MASS (Abdomen) SMALL BOWEL RESECTION (Abdomen) OMENTECTOMY (Abdomen)     Patient location during evaluation: PACU Anesthesia Type: General Level of consciousness: awake and alert Pain management: pain level controlled Vital Signs Assessment: post-procedure vital signs reviewed and stable Respiratory status: spontaneous breathing, nonlabored ventilation, respiratory function stable and patient connected to nasal cannula oxygen Cardiovascular status: blood pressure returned to baseline and stable Postop Assessment: no apparent nausea or vomiting Anesthetic complications: no   No notable events documented.  Last Vitals:  Vitals:   04/03/21 1413 04/03/21 1631  BP: 123/69 123/63  Pulse: 89 86  Resp: 18 (!) 21  Temp: (!) 36.4 C (!) 36.3 C  SpO2: 98% 100%    Last Pain:  Vitals:   04/03/21 1631  TempSrc: Oral  PainSc:                  [place] S

## 2021-04-03 NOTE — Anesthesia Procedure Notes (Addendum)
Anesthesia Regional Block: TAP block   Pre-Anesthetic Checklist: , timeout performed,  Correct Patient, Correct Site, Correct Laterality,  Correct Procedure, Correct Position, site marked,  Risks and benefits discussed,  Surgical consent,  Pre-op evaluation,  At surgeon's request and post-op pain management  Laterality: Left  Prep: chloraprep       Needles:  Injection technique: Single-shot  Needle Type: Echogenic Needle     Needle Length: 9cm  Needle Gauge: 21     Additional Needles:   Narrative:  Start time: 04/03/2021 9:47 AM End time: 04/03/2021 9:54 AM Injection made incrementally with aspirations every 5 mL.  Performed by: Personally  Anesthesiologist: Albertha Ghee, MD  Additional Notes: Pt tolerated the procedure well.

## 2021-04-03 NOTE — Anesthesia Procedure Notes (Signed)
Procedure Name: Intubation Date/Time: 04/03/2021 10:00 AM Performed by: Albertha Ghee, MD Pre-anesthesia Checklist: Patient identified, Emergency Drugs available, Suction available and Patient being monitored Patient Re-evaluated:Patient Re-evaluated prior to induction Oxygen Delivery Method: Circle system utilized Preoxygenation: Pre-oxygenation with 100% oxygen Induction Type: IV induction Ventilation: Mask ventilation without difficulty Laryngoscope Size: Miller and 2 Grade View: Grade I Tube type: Oral Tube size: 7.0 mm Number of attempts: 1 Airway Equipment and Method: Stylet Placement Confirmation: ETT inserted through vocal cords under direct vision, positive ETCO2 and breath sounds checked- equal and bilateral Tube secured with: Tape Dental Injury: Teeth and Oropharynx as per pre-operative assessment

## 2021-04-04 ENCOUNTER — Encounter (HOSPITAL_COMMUNITY): Payer: Self-pay | Admitting: General Surgery

## 2021-04-04 LAB — CBC
HCT: 24.9 % — ABNORMAL LOW (ref 36.0–46.0)
Hemoglobin: 7.6 g/dL — ABNORMAL LOW (ref 12.0–15.0)
MCH: 25.8 pg — ABNORMAL LOW (ref 26.0–34.0)
MCHC: 30.5 g/dL (ref 30.0–36.0)
MCV: 84.4 fL (ref 80.0–100.0)
Platelets: 489 10*3/uL — ABNORMAL HIGH (ref 150–400)
RBC: 2.95 MIL/uL — ABNORMAL LOW (ref 3.87–5.11)
RDW: 13.5 % (ref 11.5–15.5)
WBC: 11.9 10*3/uL — ABNORMAL HIGH (ref 4.0–10.5)
nRBC: 0 % (ref 0.0–0.2)

## 2021-04-04 LAB — CYTOLOGY - NON PAP

## 2021-04-04 LAB — BASIC METABOLIC PANEL
Anion gap: 6 (ref 5–15)
BUN: 6 mg/dL (ref 6–20)
CO2: 26 mmol/L (ref 22–32)
Calcium: 8 mg/dL — ABNORMAL LOW (ref 8.9–10.3)
Chloride: 105 mmol/L (ref 98–111)
Creatinine, Ser: 0.7 mg/dL (ref 0.44–1.00)
GFR, Estimated: >60 mL/min (ref >60)
Glucose, Bld: 145 mg/dL — ABNORMAL HIGH (ref 70–99)
Potassium: 3.8 mmol/L (ref 3.5–5.1)
Sodium: 137 mmol/L (ref 135–145)

## 2021-04-04 LAB — PHOSPHORUS: Phosphorus: 2.6 mg/dL (ref 2.5–4.6)

## 2021-04-04 LAB — MAGNESIUM: Magnesium: 1.6 mg/dL — ABNORMAL LOW (ref 1.7–2.4)

## 2021-04-04 MED ORDER — LACTATED RINGERS IV BOLUS
1000.0000 mL | Freq: Once | INTRAVENOUS | Status: AC
  Administered 2021-04-04: 1000 mL via INTRAVENOUS

## 2021-04-04 MED ORDER — KCL IN DEXTROSE-NACL 20-5-0.45 MEQ/L-%-% IV SOLN
INTRAVENOUS | Status: DC
  Filled 2021-04-04 (×7): qty 1000

## 2021-04-04 MED ORDER — MAGNESIUM SULFATE 2 GM/50ML IV SOLN
2.0000 g | Freq: Once | INTRAVENOUS | Status: AC
  Administered 2021-04-04: 2 g via INTRAVENOUS
  Filled 2021-04-04: qty 50

## 2021-04-04 MED ORDER — ENOXAPARIN SODIUM 40 MG/0.4ML IJ SOSY
40.0000 mg | PREFILLED_SYRINGE | INTRAMUSCULAR | Status: DC
  Administered 2021-04-05 – 2021-04-10 (×6): 40 mg via SUBCUTANEOUS
  Filled 2021-04-04 (×6): qty 0.4

## 2021-04-04 NOTE — Progress Notes (Signed)
1 Day Post-Op   Subjective/Chief Complaint: No nausea this AM.  Pain is tolerable.  No flatus yet.     Objective: Vital signs in last 24 hours: Temp:  [97 F (36.1 C)-98.3 F (36.8 C)] 98.1 F (36.7 C) (07/20 0820) Pulse Rate:  [85-93] 87 (07/20 0820) Resp:  [15-21] 17 (07/20 0820) BP: (93-124)/(49-69) 93/51 (07/20 0820) SpO2:  [96 %-100 %] 98 % (07/20 0820) FiO2 (%):  [0 %] 0 % (07/20 0803)    Intake/Output from previous day: 07/19 0701 - 07/20 0700 In: 2755.2 [I.V.:2404.3; IV Piggyback:350.9] Out: 1350 [Urine:1150; Blood:200] Intake/Output this shift: No intake/output data recorded.  General appearance: alert, cooperative, and no distress Resp: breathing comfortably Cardio: regular rate and rhythm GI: soft, non distended, approp tender, dressing c/d/i Extremities: extremities normal, atraumatic, no cyanosis or edema  Lab Results:  Recent Labs    04/02/21 0950 04/04/21 0353  WBC 10.1 11.9*  HGB 10.6* 7.6*  HCT 34.3* 24.9*  PLT 602* 489*   BMET Recent Labs    04/02/21 0950 04/04/21 0353  NA 137 137  K 4.0 3.8  CL 105 105  CO2 25 26  GLUCOSE 99 145*  BUN 14 6  CREATININE 0.67 0.70  CALCIUM 8.8* 8.0*   PT/INR Recent Labs    04/02/21 0950  LABPROT 13.5  INR 1.0   ABG No results for input(s): PHART, HCO3 in the last 72 hours.  Invalid input(s): PCO2, PO2  Studies/Results: No results found.  Anti-infectives: Anti-infectives (From admission, onward)    Start     Dose/Rate Route Frequency Ordered Stop   04/03/21 1515  ceFAZolin (ANCEF) IVPB 2g/100 mL premix        2 g 200 mL/hr over 30 Minutes Intravenous Every 8 hours 04/03/21 1429 04/03/21 1627   04/03/21 0730  ceFAZolin (ANCEF) IVPB 2g/100 mL premix        2 g 200 mL/hr over 30 Minutes Intravenous On call to O.R. 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   Change to d5 1/2 NS. ABL anemia - hold lovenox today Pulmonary toilet Hypomagnesemia - replete   LOS: 1 day   Milus Height, MD FACS Surgical Oncology, General Surgery, Trauma and [place] [facility], [place] for weekday/non holidays Check [email or web address] for coverage night/weekend/holidays  Do not use SecureChat as it is not reliable for timely patient care.

## 2021-04-04 NOTE — Consult Note (Signed)
   [facility] Inpatient Consult   04/04/2021  Camden Mazzaferro [facility] 31-Jan-1976 [id number]   [place] [facility] [ACO] Patient: [place] plan  Patient is currently assigned with [place] [facility] RN Care Coordinator for post hospital focused on disease management and community resource support with the health plan.     Plan: Patient will receive a post hospital call and will be evaluated for support needs and disease process assistance/education with a [facility] RN Care Coordinator.      For additional questions or referrals please contact:   Natividad Brood, RN BSN [facility] Liaison  [phone] business mobile phone Toll free office [phone]  Fax number: [phone] [email or web address]

## 2021-04-04 NOTE — Progress Notes (Signed)
Patient BP 71/58 made [name] MD aware. New orders for 1L of LR to be given per Barry Dienes, MD

## 2021-04-05 ENCOUNTER — Ambulatory Visit: Payer: Self-pay | Admitting: Radiation Oncology

## 2021-04-05 LAB — BASIC METABOLIC PANEL
Anion gap: 3 — ABNORMAL LOW (ref 5–15)
BUN: <5 mg/dL — ABNORMAL LOW (ref 6–20)
CO2: 25 mmol/L (ref 22–32)
Calcium: 8.1 mg/dL — ABNORMAL LOW (ref 8.9–10.3)
Chloride: 110 mmol/L (ref 98–111)
Creatinine, Ser: 0.56 mg/dL (ref 0.44–1.00)
GFR, Estimated: >60 mL/min (ref >60)
Glucose, Bld: 114 mg/dL — ABNORMAL HIGH (ref 70–99)
Potassium: 4 mmol/L (ref 3.5–5.1)
Sodium: 138 mmol/L (ref 135–145)

## 2021-04-05 LAB — CBC
HCT: 25.1 % — ABNORMAL LOW (ref 36.0–46.0)
Hemoglobin: 7.6 g/dL — ABNORMAL LOW (ref 12.0–15.0)
MCH: 25.9 pg — ABNORMAL LOW (ref 26.0–34.0)
MCHC: 30.3 g/dL (ref 30.0–36.0)
MCV: 85.4 fL (ref 80.0–100.0)
Platelets: 514 10*3/uL — ABNORMAL HIGH (ref 150–400)
RBC: 2.94 MIL/uL — ABNORMAL LOW (ref 3.87–5.11)
RDW: 13.7 % (ref 11.5–15.5)
WBC: 10.4 10*3/uL (ref 4.0–10.5)
nRBC: 0 % (ref 0.0–0.2)

## 2021-04-05 NOTE — Progress Notes (Signed)
2 Days Post-Op   Subjective/Chief Complaint: No flatus yet.  Feels a bit bloated.  Using PCA.  Ambulating.  Not hungry.  Occasional belching.  Required a bolus yesterday for hypotension.     Objective: Vital signs in last 24 hours: Temp:  [98.1 F (36.7 C)-98.9 F (37.2 C)] 98.1 F (36.7 C) (07/21 1202) Pulse Rate:  [86-112] 105 (07/21 1202) Resp:  [14-20] 17 (07/21 1202) BP: (94-136)/(43-68) 136/65 (07/21 1202) SpO2:  [94 %-100 %] 94 % (07/21 0929) FiO2 (%):  [0 %] 0 % (07/21 0929)    Intake/Output from previous day: 07/20 0701 - 07/21 0700 In: 1540.9 [P.O.:360; I.V.:178.3; IV Piggyback:1002.6] Out: 1800 [Urine:1550] Intake/Output this shift: Total I/O In: 240 [P.O.:240] Out: 750 [Urine:750]  General appearance: alert, cooperative, and no distress Resp: breathing comfortably Cardio: regular rate and rhythm GI: soft, sl distended, approp tender, dressing c/d/i Extremities: extremities normal, atraumatic, no cyanosis or edema  Lab Results:  Recent Labs    04/04/21 0353 04/05/21 0210  WBC 11.9* 10.4  HGB 7.6* 7.6*  HCT 24.9* 25.1*  PLT 489* 514*   BMET Recent Labs    04/04/21 0353 04/05/21 0210  NA 137 138  K 3.8 4.0  CL 105 110  CO2 26 25  GLUCOSE 145* 114*  BUN 6 <5*  CREATININE 0.70 0.56  CALCIUM 8.0* 8.1*   PT/INR No results for input(s): LABPROT, INR in the last 72 hours.  ABG No results for input(s): PHART, HCO3 in the last 72 hours.  Invalid input(s): PCO2, PO2  Studies/Results: No results found.  Anti-infectives: Anti-infectives (From admission, onward)    Start     Dose/Rate Route Frequency Ordered Stop   04/03/21 1515  ceFAZolin (ANCEF) IVPB 2g/100 mL premix        2 g 200 mL/hr over 30 Minutes Intravenous Every 8 hours 04/03/21 1429 04/03/21 1627   04/03/21 0730  ceFAZolin (ANCEF) IVPB 2g/100 mL premix        2 g 200 mL/hr over 30 Minutes Intravenous On call to O.R. 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 Change to d5 1/2 NS. ABL anemia - stable. VTE ppx- starting lovenox today.   Pulmonary toilet Hypomagnesemia - repleted yesterday   LOS: 2 days   Milus Height, MD FACS Surgical Oncology, General Surgery, Trauma and [place] [facility], [place] for weekday/non holidays Check [email or web address] for coverage night/weekend/holidays  Do not use SecureChat as it is not reliable for timely patient care.

## 2021-04-06 NOTE — Progress Notes (Signed)
3 Days Post-Op   Subjective/Chief Complaint: Still no flatus. Continues not to have n/v.  Is ambulating a lot.  Still sore.     Objective: Vital signs in last 24 hours: Temp:  [98 F (36.7 C)-98.3 F (36.8 C)] 98 F (36.7 C) (07/22 0825) Pulse Rate:  [73-105] 94 (07/22 0825) Resp:  [14-22] 17 (07/22 0825) BP: (117-138)/(59-71) 138/71 (07/22 0825) SpO2:  [94 %-100 %] 99 % (07/22 0825) FiO2 (%):  [0 %] 0 % (07/21 1744)    Intake/Output from previous day: 07/21 0701 - 07/22 0700 In: 950 [P.O.:700; I.V.:250] Out: 2770 [Urine:2770] Intake/Output this shift: No intake/output data recorded.  General appearance: alert, cooperative, and no distress Resp: breathing comfortably GI: soft, sl distended, no sig change from yesterday's exam, dressing c/d/i Extremities: extremities normal, atraumatic, no cyanosis or edema  Lab Results:  Recent Labs    04/04/21 0353 04/05/21 0210  WBC 11.9* 10.4  HGB 7.6* 7.6*  HCT 24.9* 25.1*  PLT 489* 514*   BMET Recent Labs    04/04/21 0353 04/05/21 0210  NA 137 138  K 3.8 4.0  CL 105 110  CO2 26 25  GLUCOSE 145* 114*  BUN 6 <5*  CREATININE 0.70 0.56  CALCIUM 8.0* 8.1*   PT/INR No results for input(s): LABPROT, INR in the last 72 hours.  ABG No results for input(s): PHART, HCO3 in the last 72 hours.  Invalid input(s): PCO2, PO2  Studies/Results: No results found.  Anti-infectives: Anti-infectives (From admission, onward)    Start     Dose/Rate Route Frequency Ordered Stop   04/03/21 1515  ceFAZolin (ANCEF) IVPB 2g/100 mL premix        2 g 200 mL/hr over 30 Minutes Intravenous Every 8 hours 04/03/21 1429 04/03/21 1627   04/03/21 0730  ceFAZolin (ANCEF) IVPB 2g/100 mL premix        2 g 200 mL/hr over 30 Minutes Intravenous On call to O.R. 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  Continue PCA since not really tolerating much in the way of clears.   Continue d5 1/2 NS. ABL anemia - stable. VTE ppx- Lovenox  Pulmonary toilet Recheck labs tomorrow.     LOS: 3 days   Milus Height, MD FACS Surgical Oncology, General Surgery, Trauma and [place] [facility], [place] for weekday/non holidays Check [email or web address] for coverage night/weekend/holidays  Do not use SecureChat as it is not reliable for timely patient care.

## 2021-04-07 LAB — CBC
HCT: 26.3 % — ABNORMAL LOW (ref 36.0–46.0)
Hemoglobin: 8.4 g/dL — ABNORMAL LOW (ref 12.0–15.0)
MCH: 26.3 pg (ref 26.0–34.0)
MCHC: 31.9 g/dL (ref 30.0–36.0)
MCV: 82.4 fL (ref 80.0–100.0)
Platelets: 629 10*3/uL — ABNORMAL HIGH (ref 150–400)
RBC: 3.19 MIL/uL — ABNORMAL LOW (ref 3.87–5.11)
RDW: 13.7 % (ref 11.5–15.5)
WBC: 9.5 10*3/uL (ref 4.0–10.5)
nRBC: 0 % (ref 0.0–0.2)

## 2021-04-07 LAB — BASIC METABOLIC PANEL
Anion gap: 10 (ref 5–15)
BUN: <5 mg/dL — ABNORMAL LOW (ref 6–20)
CO2: 24 mmol/L (ref 22–32)
Calcium: 8.7 mg/dL — ABNORMAL LOW (ref 8.9–10.3)
Chloride: 105 mmol/L (ref 98–111)
Creatinine, Ser: 0.57 mg/dL (ref 0.44–1.00)
GFR, Estimated: >60 mL/min (ref >60)
Glucose, Bld: 125 mg/dL — ABNORMAL HIGH (ref 70–99)
Potassium: 4 mmol/L (ref 3.5–5.1)
Sodium: 139 mmol/L (ref 135–145)

## 2021-04-07 LAB — MAGNESIUM: Magnesium: 1.9 mg/dL (ref 1.7–2.4)

## 2021-04-07 MED ORDER — ACETAMINOPHEN 10 MG/ML IV SOLN
1000.0000 mg | Freq: Four times a day (QID) | INTRAVENOUS | Status: DC
  Administered 2021-04-07 – 2021-04-08 (×3): 1000 mg via INTRAVENOUS
  Filled 2021-04-07 (×4): qty 100

## 2021-04-07 NOTE — Progress Notes (Signed)
Pulse is 115, making pt yellow MEWS, pt receiving Toprol so will administer and recheck pulse.

## 2021-04-07 NOTE — Progress Notes (Signed)
4 Days Post-Op    CC: Abdominal mass  Subjective: Patient continues have ongoing abdominal discomfort.  No flatus, she is walking and making laps around the floor.  Midline incision looks good.  Objective: Vital signs in last 24 hours: Temp:  [97.5 F (36.4 C)-98.9 F (37.2 C)] 98 F (36.7 C) (07/23 0512) Pulse Rate:  [92-112] 100 (07/23 0512) Resp:  [16-18] 17 (07/23 0512) BP: (100-138)/(49-71) 100/69 (07/23 0512) SpO2:  [98 %-100 %] 98 % (07/23 0512) FiO2 (%):  [0 %] 0 % (07/22 1125) Last BM Date: 04/02/21 640 p.o. 1200 urine No other intake output recorded Afebrile, vital signs are stable. Potassium is 4, glucose 125, WBC 9.4 Hbg: 10.6>> 7.6>> 7.6>> 8.4 No imaging Intake/Output from previous day: 07/22 0701 - 07/23 0700 In: 640 [P.O.:640] Out: 1200 [Urine:1200] Intake/Output this shift: No intake/output data recorded.  General appearance: alert, cooperative, and no distress Resp: clear to auscultation bilaterally; moving up to 1200 on I-S GI: Soft, minimal distention rare bowel sounds, no flatus.  Midline incision is clean dressing is dry and intact.  Lab Results:  Recent Labs    04/05/21 0210 04/07/21 0207  WBC 10.4 9.5  HGB 7.6* 8.4*  HCT 25.1* 26.3*  PLT 514* 629*    BMET Recent Labs    04/05/21 0210 04/07/21 0207  NA 138 139  K 4.0 4.0  CL 110 105  CO2 25 24  GLUCOSE 114* 125*  BUN <5* <5*  CREATININE 0.56 0.57  CALCIUM 8.1* 8.7*   PT/INR No results for input(s): LABPROT, INR in the last 72 hours.  Recent Labs  Lab 04/02/21 0950  AST 19  ALT 13  ALKPHOS 42  BILITOT 0.4  PROT 6.3*  ALBUMIN 3.4*     Lipase     Component Value Date/Time   LIPASE 19 02/17/2021 1052     Medications:  enoxaparin (LOVENOX) injection  40 mg Subcutaneous Q24H   HYDROmorphone   Intravenous Q4H   loratadine  10 mg Oral Daily   metoprolol succinate  50 mg Oral Daily   venlafaxine XR  75 mg Oral Daily    dextrose 5 % and 0.45 % NaCl with KCl 20  mEq/L 50 mL/hr at 04/07/21 [id number]    Anti-infectives (From admission, onward)    Start     Dose/Rate Route Frequency Ordered Stop   04/03/21 1515  ceFAZolin (ANCEF) IVPB 2g/100 mL premix        2 g 200 mL/hr over 30 Minutes Intravenous Every 8 hours 04/03/21 1429 04/03/21 1627   04/03/21 0730  ceFAZolin (ANCEF) IVPB 2g/100 mL premix        2 g 200 mL/hr over 30 Minutes Intravenous On call to O.R. 04/03/21 0725 04/03/21 0959        Assessment/Plan  Abdominal mass Laparotomy, resection of abdominal mass with small bowel resection and omentectomy 04/03/2021 Dr. [name] POD #4  -Plan: Continue to await return of bowel function.  We will start her on some IV Tylenol and see if this helps reduce her use of the PCA.  FEN IV fluids/listed for full liquids but she is just sipping some currently. ID: Cefazolin preop DVT: Lovenox  Hx breast cancer Hx neurofibromatosis       LOS: 4 days    Anne Horton 04/07/2021 Please see Amion

## 2021-04-08 LAB — CBC
HCT: 28.4 % — ABNORMAL LOW (ref 36.0–46.0)
Hemoglobin: 8.8 g/dL — ABNORMAL LOW (ref 12.0–15.0)
MCH: 25.7 pg — ABNORMAL LOW (ref 26.0–34.0)
MCHC: 31 g/dL (ref 30.0–36.0)
MCV: 83 fL (ref 80.0–100.0)
Platelets: 687 10*3/uL — ABNORMAL HIGH (ref 150–400)
RBC: 3.42 MIL/uL — ABNORMAL LOW (ref 3.87–5.11)
RDW: 13.8 % (ref 11.5–15.5)
WBC: 8.8 10*3/uL (ref 4.0–10.5)
nRBC: 0 % (ref 0.0–0.2)

## 2021-04-08 LAB — BASIC METABOLIC PANEL
Anion gap: 8 (ref 5–15)
BUN: <5 mg/dL — ABNORMAL LOW (ref 6–20)
CO2: 22 mmol/L (ref 22–32)
Calcium: 8.5 mg/dL — ABNORMAL LOW (ref 8.9–10.3)
Chloride: 110 mmol/L (ref 98–111)
Creatinine, Ser: 0.65 mg/dL (ref 0.44–1.00)
GFR, Estimated: >60 mL/min (ref >60)
Glucose, Bld: 104 mg/dL — ABNORMAL HIGH (ref 70–99)
Potassium: 4.2 mmol/L (ref 3.5–5.1)
Sodium: 140 mmol/L (ref 135–145)

## 2021-04-08 LAB — MAGNESIUM: Magnesium: 1.9 mg/dL (ref 1.7–2.4)

## 2021-04-08 LAB — AEROBIC/ANAEROBIC CULTURE W GRAM STAIN (SURGICAL/DEEP WOUND): Culture: NO GROWTH

## 2021-04-08 MED ORDER — ACETAMINOPHEN 500 MG PO TABS
1000.0000 mg | ORAL_TABLET | Freq: Three times a day (TID) | ORAL | Status: DC
  Administered 2021-04-08 – 2021-04-10 (×6): 1000 mg via ORAL
  Filled 2021-04-08 (×7): qty 2

## 2021-04-08 MED ORDER — OXYCODONE HCL 5 MG PO TABS: 5.0000 mg | ORAL_TABLET | ORAL | Status: DC | PRN

## 2021-04-08 NOTE — Progress Notes (Signed)
5 Days Post-Op    CC: Abdominal mass  Subjective: She is doing a little better this a.m.  She had 1 small bowel movement.  She felt the IV Tylenol was helpful.  Her midline incision still has a waffle dressing in place but looks okay.  She has a few bowel sounds.  Objective: Vital signs in last 24 hours: Temp:  [97.8 F (36.6 C)-98.8 F (37.1 C)] 98.3 F (36.8 C) (07/24 0349) Pulse Rate:  [82-109] 89 (07/24 0349) Resp:  [15-20] 16 (07/24 0349) BP: (113-144)/(55-78) 131/55 (07/24 0349) SpO2:  [98 %-100 %] 100 % (07/24 0349) FiO2 (%):  [0 %] 0 % (07/23 1855) Last BM Date: 04/07/21 (small) 360 p.o. 1800 IV BM x1 recorded Afebrile vital signs are stable Labs are stable  Intake/Output from previous day: 07/23 0701 - 07/24 0700 In: 1850.4 [P.O.:360; I.V.:1190.4; IV Piggyback:300] Out: 1 [Stool:1] Intake/Output this shift: No intake/output data recorded.  General appearance: alert, cooperative, and no distress Resp: Clear GI: Soft, not really distended.  Still pretty tender.  She has a few bowel sounds and had a small bowel movement.  Her incisions under the waffle dressing but looks okay probably remove the dressing tomorrow.  Lab Results:  Recent Labs    04/07/21 0207 04/08/21 0110  WBC 9.5 8.8  HGB 8.4* 8.8*  HCT 26.3* 28.4*  PLT 629* 687*    BMET Recent Labs    04/07/21 0207 04/08/21 0110  NA 139 140  K 4.0 4.2  CL 105 110  CO2 24 22  GLUCOSE 125* 104*  BUN <5* <5*  CREATININE 0.57 0.65  CALCIUM 8.7* 8.5*   PT/INR No results for input(s): LABPROT, INR in the last 72 hours.  Recent Labs  Lab 04/02/21 0950  AST 19  ALT 13  ALKPHOS 42  BILITOT 0.4  PROT 6.3*  ALBUMIN 3.4*     Lipase     Component Value Date/Time   LIPASE 19 02/17/2021 1052     Medications:  enoxaparin (LOVENOX) injection  40 mg Subcutaneous Q24H   HYDROmorphone   Intravenous Q4H   loratadine  10 mg Oral Daily   metoprolol succinate  50 mg Oral Daily   venlafaxine XR  75  mg Oral Daily    Assessment/Plan Abdominal mass Laparotomy, resection of abdominal mass with small bowel resection and omentectomy 04/03/2021 Dr. [name] POD #5  -Plan: Bowel function is returning.  I told her to take more of her liquids today.  Going to start her on some oral pain medications, but leave the PCA in place.   FEN IV fluids/listed for full liquids she has been taking sips I told her to go ahead and advance the amount of p.o. intake today.   ID: Cefazolin preop DVT: Lovenox   Hx breast cancer Hx neurofibromatosis Anemia -postop  -Hemoglobin 10.1>> 7.6>>7.6>> 8.4>> 8.8         LOS: 5 days    Anne Horton 04/08/2021 Please see Amion

## 2021-04-08 NOTE — Plan of Care (Signed)

## 2021-04-09 LAB — BPAM RBC
Blood Product Expiration Date: 202208102359
Blood Product Expiration Date: 202208112359
Unit Type and Rh: 6200
Unit Type and Rh: 6200

## 2021-04-09 LAB — TYPE AND SCREEN
ABO/RH(D): A POS
Antibody Screen: NEGATIVE
Unit division: 0
Unit division: 0

## 2021-04-09 LAB — SURGICAL PATHOLOGY

## 2021-04-09 MED ORDER — SENNOSIDES-DOCUSATE SODIUM 8.6-50 MG PO TABS
1.0000 | ORAL_TABLET | Freq: Every day | ORAL | Status: DC
  Administered 2021-04-09: 1 via ORAL
  Filled 2021-04-09 (×2): qty 1

## 2021-04-09 MED ORDER — POLYETHYLENE GLYCOL 3350 17 G PO PACK
17.0000 g | PACK | Freq: Every day | ORAL | Status: DC
  Administered 2021-04-09: 17 g via ORAL
  Filled 2021-04-09 (×2): qty 1

## 2021-04-09 NOTE — Progress Notes (Signed)
6 Days Post-Op    CC: Abdominal mass  Subjective: Pt now passing a lot of gas.  Only the one small bm so far.  Tolerated soft diet from yesterday.    Objective: Vital signs in last 24 hours: Temp:  [98 F (36.7 C)-98.7 F (37.1 C)] 98 F (36.7 C) (07/25 0840) Pulse Rate:  [80-96] 96 (07/25 0840) Resp:  [17-20] 20 (07/25 0840) BP: (119-126)/(50-70) 119/70 (07/25 0840) SpO2:  [98 %-100 %] 100 % (07/25 0840) FiO2 (%):  [0 %] 0 % (07/25 0837) Last BM Date: 04/07/21   Intake/Output from previous day: 07/24 0701 - 07/25 0700 In: 1835.8 [P.O.:660; I.V.:1175.8] Out: 1500 [Urine:1500] Intake/Output this shift: No intake/output data recorded.  General appearance: alert, cooperative, and no distress Resp: Clear GI: Soft, nondistended.  Only tender at incision.  Dressing removed.  Some irritation from glue, but no evidence of infection.  Lab Results:  Recent Labs    04/07/21 0207 04/08/21 0110  WBC 9.5 8.8  HGB 8.4* 8.8*  HCT 26.3* 28.4*  PLT 629* 687*    BMET Recent Labs    04/07/21 0207 04/08/21 0110  NA 139 140  K 4.0 4.2  CL 105 110  CO2 24 22  GLUCOSE 125* 104*  BUN <5* <5*  CREATININE 0.57 0.65  CALCIUM 8.7* 8.5*   PT/INR No results for input(s): LABPROT, INR in the last 72 hours.  Recent Labs  Lab 04/02/21 0950  AST 19  ALT 13  ALKPHOS 42  BILITOT 0.4  PROT 6.3*  ALBUMIN 3.4*     Lipase     Component Value Date/Time   LIPASE 19 02/17/2021 1052     Medications:  acetaminophen  1,000 mg Oral Q8H   enoxaparin (LOVENOX) injection  40 mg Subcutaneous Q24H   loratadine  10 mg Oral Daily   metoprolol succinate  50 mg Oral Daily   polyethylene glycol  17 g Oral Daily   senna-docusate  1 tablet Oral Daily   venlafaxine XR  75 mg Oral Daily    Assessment/Plan Abdominal mass Laparotomy, resection of abdominal mass with small bowel resection and omentectomy 04/03/2021 Dr. [name] POD #5  Saline lock iv fluids D/c pca Standing bowel  regimen.   Anticipate d/c tomorrow barring unforseen issues.  ID: Cefazolin preop DVT: Lovenox   Hx breast cancer Hx neurofibromatosis ABL anemia - has been stable.           LOS: 6 days    [name] 04/09/2021

## 2021-04-10 ENCOUNTER — Other Ambulatory Visit (HOSPITAL_COMMUNITY): Payer: Self-pay

## 2021-04-10 MED ORDER — TRAMADOL HCL 50 MG PO TABS
50.0000 mg | ORAL_TABLET | Freq: Four times a day (QID) | ORAL | 1 refills | Status: DC | PRN
  Filled 2021-04-10: qty 30, 8d supply, fill #0

## 2021-04-10 MED ORDER — OXYCODONE HCL 5 MG PO TABS
5.0000 mg | ORAL_TABLET | ORAL | 0 refills | Status: DC | PRN
  Filled 2021-04-10: qty 30, 5d supply, fill #0

## 2021-04-10 NOTE — Progress Notes (Signed)
Patient given discharge teaching. Patient verbalized understanding of the teaching. patient discharged to home with husband and daughter with all belongings via wheelchair.

## 2021-04-10 NOTE — Discharge Instructions (Signed)
[facility]      [facility] ?[phone] ? ?ABDOMINAL SURGERY: POST OP INSTRUCTIONS ? ?Always review your discharge instruction sheet given to you by the facility where your surgery was performed. ? ?IF YOU HAVE DISABILITY OR FAMILY LEAVE FORMS, YOU MUST BRING THEM TO THE OFFICE FOR PROCESSING.  PLEASE DO NOT GIVE THEM TO YOUR DOCTOR. ? ?A prescription for pain medication may be given to you upon discharge.  Take your pain medication as prescribed, if needed.  If narcotic pain medicine is not needed, then you may take acetaminophen (Tylenol) or ibuprofen (Advil) as needed. ?Take your usually prescribed medications unless otherwise directed. ?If you need a refill on your pain medication, please contact your pharmacy. They will contact our office to request authorization.  Prescriptions will not be filled after 5pm or on week-ends. ?You should follow a light diet the first few days after arrival home, such as soup and crackers, pudding, etc.unless your doctor has advised otherwise. A high-fiber, low fat diet can be resumed as tolerated.   Be sure to include lots of fluids daily. Most patients will experience some swelling and bruising on the chest and neck area.  Ice packs will help.  Swelling and bruising can take several days to resolve ?Most patients will experience some swelling and bruising in the area of the incision. Ice pack will help. Swelling and bruising can take several days to resolve..  ?It is common to experience some constipation if taking pain medication after surgery.  Increasing fluid intake and taking a stool softener will usually help or prevent this problem from occurring.  A mild laxative (Milk of Magnesia or Miralax) should be taken according to package directions if there are no bowel movements after 48 hours. ? You may have steri-strips (small skin tapes) in place directly over the incision.  These strips should be left on the skin for 10-14 days.  If your surgeon used skin glue on  the incision, you may shower in 48 hours.  The glue will flake off over the next 2-3 weeks.  Any sutures or staples will be removed at the office during your follow-up visit. You may find that a light gauze bandage over your incision may keep your staples from being rubbed or pulled. You may shower and replace the bandage daily. ?ACTIVITIES:  You may resume regular (light) daily activities beginning the next day--such as daily self-care, walking, climbing stairs--gradually increasing activities as tolerated.  You may have sexual intercourse when it is comfortable.  Refrain from any heavy lifting or straining until approved by your doctor. ?You may drive when you no longer are taking prescription pain medication, you can comfortably wear a seatbelt, and you can safely maneuver your car and apply brakes ?Return to Work: __________8 weeks if applicable_________________________ ?You should see your doctor in the office for a follow-up appointment approximately two weeks after your surgery.  Make sure that you call for this appointment within a day or two after you arrive home to insure a convenient appointment time. ?OTHER INSTRUCTIONS:  ?_____________________________________________________________ ?_____________________________________________________________ ? ?WHEN TO CALL YOUR DOCTOR: ?Fever over 101.0 ?Inability to urinate ?Nausea and/or vomiting ?Extreme swelling or bruising ?Continued bleeding from incision. ?Increased pain, redness, or drainage from the incision. ?Difficulty swallowing or breathing ?Muscle cramping or spasms. ?Numbness or tingling in hands or feet or around lips. ? ?The clinic staff is available to answer your questions during regular business hours.  Please don?t hesitate to call and ask to speak to   one of the nurses if you have concerns. ? ?For further questions, please visit [email or web address] ? ? ?

## 2021-04-10 NOTE — Discharge Summary (Signed)
Physician Discharge Summary  Patient ID: Anne Horton MRN: [id number] DOB/AGE: [birth year] 45 y.o.  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   ABL anemia  Discharged Condition: stable  Hospital Course: Pt was admitted to the floor following small bowel resection 04/01/21 with findings of multiple intraabdominal tumors in the jejunum. She did well with PCA for pain control.  She was ambulatory and able to void spontaneously on POD 1.  She denied n/v.  She did have a bit of an expected post op ileus initially.  This eventually resolved and she started passing gas.  She then was transitioned off IV fluids and off the PCA.  She did have some LUQ pain that was present since the surgery.  That was essentially unchanged over the week.  She was able to eat and drink and desired discharge.  She was tolerating oral pain medications.    Consults: None  Significant Diagnostic Studies: labs: HCT prior to d/c 28.4.  Cr 0.65, WBCs 8.8k  Treatments: surgery: see above  Discharge Exam: Blood pressure (!) 118/47, pulse 91, temperature 98.9 F (37.2 C), temperature source Oral, resp. rate 18, height '5\' 2"'$  (1.575 m), weight 63 kg, SpO2 100 %. General appearance: alert, cooperative, and no distress Resp: breathing comfortably GI: soft, non distended, approp tender LUQ and near incision.   Extremities: extremities normal, atraumatic, no cyanosis or edema  Disposition: Discharge disposition: 01-Home or Self Care      Discharge Instructions     Call MD for:  difficulty breathing, headache or visual disturbances   Complete by: As directed    Call MD for:  hives   Complete by: As directed    Call MD for:  persistant nausea and vomiting   Complete by: As directed    Call MD for:  redness, tenderness, or signs of infection (pain, swelling,  redness, odor or green/yellow discharge around incision site)   Complete by: As directed    Call MD for:  severe uncontrolled pain   Complete by: As directed    Call MD for:  temperature >100.4   Complete by: As directed    Diet - low sodium heart healthy   Complete by: As directed    Increase activity slowly   Complete by: As directed       Allergies as of 04/10/2021       Reactions   Latex Rash        Medication List     STOP taking these medications    metroNIDAZOLE 500 MG tablet Commonly known as: FLAGYL   neomycin 500 MG tablet Commonly known as: MYCIFRADIN   ondansetron 4 MG disintegrating tablet Commonly known as: ZOFRAN-ODT   oxyCODONE-acetaminophen 5-325 MG tablet Commonly known as: Percocet       TAKE these medications    cetirizine 10 MG tablet Commonly known as: ZYRTEC Take 10 mg by mouth daily.   ibuprofen 200 MG tablet Commonly known as: ADVIL Take 600-800 mg by mouth every 6 (six) hours as needed for moderate pain.   multivitamin tablet Take 1 tablet by mouth daily.   oxyCODONE 5 MG immediate release tablet Commonly known as: Oxy IR/ROXICODONE Take 1 tablet (5 mg total) by mouth every 4 (four) hours as needed for severe pain. To use at nighttime, do not take and drive   senna-docusate 8.6-50 MG tablet Commonly  known as: Senokot-S Take 1 tablet by mouth daily as needed for mild constipation.   traMADol 50 MG tablet Commonly known as: ULTRAM Take 1 tablet (50 mg total) by mouth every 6 (six) hours as needed for moderate pain.   venlafaxine XR 75 MG 24 hr capsule Commonly known as: EFFEXOR-XR Take 1 capsule (75 mg total) by mouth daily.       ASK your doctor about these medications    ALPRAZolam 0.5 MG tablet Commonly known as: XANAX Take 1/2 to 1 tablet by mouth 2 times a day as needed   gabapentin 100 MG capsule Commonly known as: NEURONTIN Take 1 capsule (100 mg total) by mouth at bedtime.   metoprolol succinate 50 MG 24  hr tablet Commonly known as: TOPROL-XL TAKE 1 TABLET BY MOUTH DAILY. TAKE WITH OR IMMEDIATELY FOLLOWING A MEAL.   ondansetron 4 MG disintegrating tablet Commonly known as: Zofran ODT Take 1 tablet (4 mg total) by mouth every 8 (eight) hours as needed for nausea or vomiting.   propranolol 10 MG tablet Commonly known as: INDERAL TAKE 1 TABLET BY MOUTH FOUR TIMES DAILY AS NEEDED FOR PALPITATIONS   tamoxifen 20 MG tablet Commonly known as: NOLVADEX TAKE 1 TABLET BY MOUTH ONCE A DAY        Follow-up Information     [name], MD Follow up in 2 week(s).   Specialty: General Surgery Contact information: [address] [place] [address] [phone]                 Signed: [name] 04/10/2021, 3:39 PM

## 2021-04-11 ENCOUNTER — Other Ambulatory Visit: Payer: Self-pay | Admitting: *Deleted

## 2021-04-11 NOTE — Patient Outreach (Signed)
[facility]) Care Management  04/11/2021  Anne Horton [facility] 1975-10-13 [id number]  Transition of care telephone call  Referral received:04/02/21 Initial outreach:04/11/21 Insurance: [facility]   Initial unsuccessful telephone call to patient's preferred number in order to complete transition of care assessment; no answer, left HIPAA compliant voicemail message requesting return call.   Objective: Per the electronic medical record, Mrs. Anne Horton  was hospitalized at [facility] for [place], Exploratory Laparatomy resection of abdominal mass  Comorbidities include: Hypertension , Hyperlipidemia , Anxiety, Left Breast Cancer  She was discharged to home on 04/10/21  without the need for home health services or DME. Plan: This RNCM will route unsuccessful outreach letter with [place] Management pamphlet and 24 hour Nurse Advice Line Magnet to [place] Management clinical pool to be mailed to patient's home address. This RNCM will attempt another outreach within 4 business days.   [name], RN, BSN  [place] Management Coordinator  [phone]- Mobile [phone]- Toll Free Main Office

## 2021-04-16 ENCOUNTER — Encounter: Payer: Self-pay | Admitting: *Deleted

## 2021-04-16 ENCOUNTER — Other Ambulatory Visit (HOSPITAL_COMMUNITY): Payer: Self-pay

## 2021-04-16 ENCOUNTER — Other Ambulatory Visit: Payer: Self-pay | Admitting: *Deleted

## 2021-04-16 ENCOUNTER — Telehealth: Payer: Self-pay | Admitting: Genetic Counselor

## 2021-04-16 NOTE — Patient Outreach (Signed)
Anne Horton [facility]) Care Management  04/16/2021  Anne Horton [facility] December 17, 1975 [id number]   Transition of care call/case closure   Referral received:04/02/21 Initial outreach:04/11/21 Insurance: [place]    #2 Outreach call attempt  Subjective: 2nd attempt successful telephone call to patient's preferred number in order to complete transition of care assessment; 2 HIPAA identifiers verified. Explained purpose of call and completed transition of care assessment.  States that she is doing okay,  denies post-operative problems, says surgical incisions are unremarkable, states surgical pain well managed with prescribed medications. She tolerating diet but appetite is still a little slow on some days, she denies nausea. She denies bowel or bladder problems, having bowel movements. Spouse/family are assisting with her recovery.   Reviewed accessing the following [place] Benefits : She discussed participation in [place] chronic disease management education programs. She does not have the hospital indemnity. She is accessing FMLA benefits.  She  uses a [facility] outpatient pharmacy at [facility].      Objective:   Anne Horton) [place]  was hospitalized at [facility] for [facility], Exploratory Laparotomy resection of abdominal mass  Comorbidities include: Hypertension , Hyperlipidemia , Anxiety, Left Breast Cancer She was discharged to home on 04/10/21  without the need for home health services or DME. Assessment:  Patient voices good understanding of all discharge instructions.  See transition of care flowsheet for assessment details.   Plan:  Reviewed hospital discharge diagnosis of  Exploratory Laparotomy with resection of abdominal mass discharge treatment plan using hospital discharge instructions, assessing medication adherence, reviewing problems requiring provider notification, and discussing the importance of follow  up with surgeon, primary care provider and/or specialists as directed.  Reviewed [place] healthy lifestyle program information to receive discounted premium for  2023   Step 1: Get  your annual physical  Step 2: Complete your health assessment  Step 3:Identify your current health status and complete the corresponding action step between September 16, 2020 and May 17, 2021.     No ongoing care management needs identified so will close case to [place] Management services .Thanked patient for their services to [facility].   Anne Draft, RN, BSN  [place] Management Coordinator  [phone]- Mobile [phone]- Toll Free Main Office

## 2021-04-16 NOTE — Progress Notes (Signed)
I left vm requesting a return call to see if Ms Anne Horton wants an appt with Dr Burr Medico or Dr Benay Spice. 1340 I spoke with Ms Anne Horton. She will see Dr Burr Medico on 04/24/2021 at 1120.

## 2021-04-16 NOTE — Telephone Encounter (Signed)
LM on VM to CB and set up blood draw appointment.

## 2021-04-17 ENCOUNTER — Other Ambulatory Visit (HOSPITAL_COMMUNITY): Payer: Self-pay

## 2021-04-19 ENCOUNTER — Encounter: Payer: Self-pay | Admitting: Hematology and Oncology

## 2021-04-23 DIAGNOSIS — C49A3 Gastrointestinal stromal tumor of small intestine: Secondary | ICD-10-CM | POA: Insufficient documentation

## 2021-04-23 NOTE — Progress Notes (Signed)
[place]   [phone] Fax:([phone]) [place] Note   Patient Care Team: Daphene Calamity as PCP - General (Family Medicine) [name], Wonda Cheng, MD as PCP - Cardiology (Cardiology) [name], MD as Consulting Physician (Radiation Oncology) Nicholas Lose, MD as Consulting Physician (Hematology and Oncology) Rolm Bookbinder, MD as Consulting Physician (General Surgery) 04/24/2021  CHIEF COMPLAINTS/PURPOSE OF CONSULTATION:  GIST of small bowel   REFERRAL PHYSICIAN: Dr. Barry Dienes   HISTORY OF PRESENTING ILLNESS:  Anne Horton 45 y.o. female is here because of her recently diagnosed GIST of small bowel.  She was referred by her surgeon Dr. Barry Dienes.  She presents to the clinic with her husband.  She has noticed intermittent constipation and occasional abdominal cramping for the past few years.  Her symptom was mild overall.  In late May, she developed worsening abdominal pain, along with some mild nausea.  She presented to the emergency room on February 17, 2021 due to severe pain.  CT of abdomen pelvis showed a 15.7 cm mass in the left mid to lower abdomen.  This was further evaluated by abdominal MRI on February 26, 2021, the mass was felt to be separated from the left ovary, and GIST was felt to be likely.  She was referred to Dr. Barry Dienes, and underwent small bowel resection and omentectomy on April 03, 2021.  Multiple tumor in the small bowel was found, and unfortunately there were 3 small tumor in the small bowel were not able to be resected.  Surgical pathology confirmed GIST, high-grade, margins and lymph nodes were negative.  She has recovered well from surgery, midline incision has healed very well.  She is off prescription pain medication now, energy and appetite has recovered fairly well.  No other complaints.  Past medical history is significant for neurofibromatosis, and history of stage I breast cancer in 2019, currently on  tamoxifen.  MEDICAL HISTORY:  Past Medical History:  Diagnosis Date   Allergy    Anemia    Anxiety    Breast cancer ([place]) 05/07/2018   left invasive ductal    Cancer (HCC)    Chest pain    Depression    Family history of breast cancer    Fibromyalgia    neurofibromatosis   H/O: depression    Hx of migraines    Mitral regurgitation    mild per echo EF 55-60%   Neurofibromatosis (HCC)    Palpitations    Personal history of radiation therapy    Tachycardia    Hx. of   Tachycardia    Wears contact lenses     SURGICAL HISTORY: Past Surgical History:  Procedure Laterality Date   BOWEL RESECTION N/A 04/03/2021   Procedure: SMALL BOWEL RESECTION;  Surgeon: [name], MD;  Location: [facility];  Service: General;  Laterality: N/A;   BREAST BIOPSY     BREAST LUMPECTOMY Left 05/07/2018   Invasive ductal    BREAST LUMPECTOMY WITH RADIOACTIVE SEED AND SENTINEL LYMPH NODE BIOPSY Left 05/07/2018   Procedure: BREAST LUMPECTOMY WITH RADIOACTIVE SEED AND SENTINEL LYMPH NODE BIOPSY;  Surgeon: Rolm Bookbinder, MD;  Location: [place];  Service: General;  Laterality: Left;   CESAREAN SECTION     CHOLECYSTECTOMY N/A 01/04/2015   Procedure: LAPAROSCOPIC CHOLECYSTECTOMY WITH INTRAOPERATIVE CHOLANGIOGRAM;  Surgeon: Erroll Luna, MD;  Location: [facility];  Service: General;  Laterality: N/A;   COLONOSCOPY     ESOPHAGOGASTRODUODENOSCOPY     LAPAROTOMY  N/A 04/03/2021   Procedure: EXPLORATORY LAPAROTOMY WITH RESECTION OF ABDOMINAL MASS;  Surgeon: [name], MD;  Location: [facility];  Service: General;  Laterality: N/A;   MASTOPEXY Bilateral 09/05/2020   Procedure: BILATERAL BREAST MASTOPEXY;  Surgeon: Irene Limbo, MD;  Location: [facility];  Service: Plastics;  Laterality: Bilateral;   OMENTECTOMY N/A 04/03/2021   Procedure: OMENTECTOMY;  Surgeon: [name], MD;  [facility];  Service: General;  Laterality: N/A;   WISDOM TOOTH  EXTRACTION      SOCIAL HISTORY: Social History   Socioeconomic History   Marital status: Married    Spouse name: Not on file   Number of children: 1   Years of education: Not on file   Highest education level: Not on file  Occupational History   Occupation: [employer]  Tobacco Use   Smoking status: Never   Smokeless tobacco: Never  Vaping Use   Vaping Use: Never used  Substance and Sexual Activity   Alcohol use: Yes    Alcohol/week: 0.0 standard drinks    Comment: occasional but rarely   Drug use: No   Sexual activity: Yes    Birth control/protection: None  Other Topics Concern   Not on file  Social History Narrative   Pt lives with husband and daughter   Social Determinants of Health   Financial Resource Strain: Not on file  Food Insecurity: Not on file  Transportation Needs: Not on file  Physical Activity: Not on file  Stress: Not on file  Social Connections: Not on file  Intimate Partner Violence: Not on file    FAMILY HISTORY: Family History  Problem Relation Age of Onset   Breast cancer Mother 94       estrogen negative, recurred in 2012   Diabetes Mother    Neurofibromatosis Father    Atrial fibrillation Maternal Grandfather    Cancer Paternal Grandmother        type unk, died at 1   Neurofibromatosis Paternal Grandmother    Stroke Paternal Grandfather    Heart disease Paternal Uncle    Diabetes Paternal Uncle    Heart attack Neg Hx    Hypertension Neg Hx     ALLERGIES:  is allergic to latex.  MEDICATIONS:  Current Outpatient Medications  Medication Sig Dispense Refill   ALPRAZolam (XANAX) 0.5 MG tablet Take 1/2 to 1 tablet by mouth 2 times a day as needed (Patient taking differently: Take 0.25-0.5 mg by mouth 2 (two) times daily as needed for anxiety.) 180 tablet 1   cetirizine (ZYRTEC) 10 MG tablet Take 10 mg by mouth daily.     gabapentin (NEURONTIN) 100 MG capsule Take 1 capsule (100 mg total) by mouth at bedtime. (Patient taking differently:  Take 100 mg by mouth at bedtime as needed (pain).) 30 capsule 3   ibuprofen (ADVIL) 200 MG tablet Take 600-800 mg by mouth every 6 (six) hours as needed for moderate pain.     metoprolol succinate (TOPROL-XL) 50 MG 24 hr tablet TAKE 1 TABLET BY MOUTH DAILY. TAKE WITH OR IMMEDIATELY FOLLOWING A MEAL. (Patient taking differently: Take 50 mg by mouth daily.) 90 tablet 3   Multiple Vitamin (MULTIVITAMIN) tablet Take 1 tablet by mouth daily.     propranolol (INDERAL) 10 MG tablet TAKE 1 TABLET BY MOUTH FOUR TIMES DAILY AS NEEDED FOR PALPITATIONS (Patient taking differently: Take 10 mg by mouth 4 (four) times daily as needed (palpitations).) 120 tablet 3   senna-docusate (SENOKOT-S) 8.6-50 MG tablet  Take 1 tablet by mouth daily as needed for mild constipation.     tamoxifen (NOLVADEX) 20 MG tablet TAKE 1 TABLET BY MOUTH ONCE A DAY (Patient not taking: No sig reported) 90 tablet 3   venlafaxine XR (EFFEXOR-XR) 75 MG 24 hr capsule Take 1 capsule (75 mg total) by mouth daily. 90 capsule 1   No current facility-administered medications for this visit.    REVIEW OF SYSTEMS:   Constitutional: Denies fevers, chills or abnormal night sweats, no recent weight loss  Eyes: Denies blurriness of vision, double vision or watery eyes Ears, nose, mouth, throat, and face: Denies mucositis or sore throat Respiratory: Denies cough, dyspnea or wheezes Cardiovascular: Denies palpitation, chest discomfort or lower extremity swelling Gastrointestinal:  Denies nausea, heartburn or change in bowel habits Skin: Denies abnormal skin rashes Lymphatics: Denies new lymphadenopathy or easy bruising Neurological:Denies numbness, tingling or new weaknesses Behavioral/Psych: Mood is stable, no new changes  All other systems were reviewed with the patient and are negative.  PHYSICAL EXAMINATION: ECOG PERFORMANCE STATUS: 1 - Symptomatic but completely ambulatory  Vitals:   04/24/21 1135  BP: (!) 131/50  Pulse: 94  Resp: 20   Temp: 98.2 F (36.8 C)  SpO2: 100%   Filed Weights   04/24/21 1135  Weight: 127 lb 3.2 oz (57.7 kg)    GENERAL:alert, no distress and comfortable SKIN: skin color, texture, turgor are normal, no rashes or significant lesions EYES: normal, conjunctiva are pink and non-injected, sclera clear OROPHARYNX:no exudate, no erythema and lips, buccal mucosa, and tongue normal  NECK: supple, thyroid normal size, non-tender, without nodularity LYMPH:  no palpable lymphadenopathy in the cervical, axillary or inguinal LUNGS: clear to auscultation and percussion with normal breathing effort HEART: regular rate & rhythm and no murmurs and no lower extremity edema ABDOMEN:abdomen soft, non-tender and normal bowel sounds Musculoskeletal:no cyanosis of digits and no clubbing  PSYCH: alert & oriented x 3 with fluent speech NEURO: no focal motor/sensory deficits  LABORATORY DATA:  I have reviewed the data as listed CBC Latest Ref Rng & Units 04/24/2021 04/08/2021 04/07/2021  WBC 4.0 - 10.5 K/uL 8.0 8.8 9.5  Hemoglobin 12.0 - 15.0 g/dL 10.0(L) 8.8(L) 8.4(L)  Hematocrit 36.0 - 46.0 % 32.1(L) 28.4(L) 26.3(L)  Platelets 150 - 400 K/uL 499(H) 687(H) 629(H)   CMP Latest Ref Rng & Units 04/24/2021 04/08/2021 04/07/2021  Glucose 70 - 99 mg/dL 101(H) 104(H) 125(H)  BUN 6 - 20 mg/dL 10 <5(L) <5(L)  Creatinine 0.44 - 1.00 mg/dL 0.65 0.65 0.57  Sodium 135 - 145 mmol/L 142 140 139  Potassium 3.5 - 5.1 mmol/L 3.7 4.2 4.0  Chloride 98 - 111 mmol/L 109 110 105  CO2 22 - 32 mmol/L '24 22 24  ' Calcium 8.9 - 10.3 mg/dL 9.2 8.5(L) 8.7(L)  Total Protein 6.5 - 8.1 g/dL 7.0 - -  Total Bilirubin 0.3 - 1.2 mg/dL 0.3 - -  Alkaline Phos 38 - 126 U/L 40 - -  AST 15 - 41 U/L 15 - -  ALT 0 - 44 U/L 11 - -     RADIOGRAPHIC STUDIES: I have personally reviewed the radiological images as listed and agreed with the findings in the report. No results found.  ASSESSMENT & PLAN:  45 yo female  Multifocal GIST of small bowel,  pT4N0Mo, stage IIIB, high grade, mitotic rate 9/HPF -I reviewed her surgical pathology findings, and discussed his results with patient and her husband in detail.  Unfortunately there are multiple high risk features, including  high-grade, high mitotic rate, large tumor in the location of the tumor -Her CT and MRI of abdomen and pelvis was negative for metastasis.  I recommend a CT chest without contrast to complete staging. -Based on the AFIP prognostic model, her progression free survival is 10% during the long term f/u.  -She also has 3 residual small tumors which were not resected.  -We discussed that her neurofibromatosis is high risk for GIST.  -Due to the high risk of recurrence, I recommend adjuvant imatinib 400 mg daily for 3 years, or longer is her residual tumor does not disappear after adjuvant therapy. -I discussed the benefit and side effects of imatinib, which includes but not limited to, fatigue, gastric discomfort, diarrhea, nausea, cytopenias, fluid retention, pleural effusion, abnormal liver function, etc.  Patient voiced understanding, agrees to proceed. -I will call into [facility], plan to start on 8/22  2.NF-1 -She will see genetics today -We discussed that neurofibromatosis is high risk for GIST   3.  Iron deficient anemia -Likely secondary to tumor bleeding from GIST -She previously received IV iron -We will monitor iron level  4.  Malignant neoplasm of upper-outer quadrant of left breast, ER+/PR+/HER2-, stage I (pT1cN0) -Status postlumpectomy in August 2019 and adjuvant radiation -Oncotype DX score 11 -She is on adjuvant tamoxifen, tolerating well.  Plan -I recommend adjuvant imatinib 400 mg daily, will call into pharmacy today, and plan to start on August 22 -CT chest without contrast to complete staging -Lab and follow-up in 5 weeks  Orders Placed This Encounter  Procedures   CBC with Differential ([facility] Only)    Standing Status:   Standing     Number of Occurrences:   10    Standing Expiration Date:   04/24/2022   Iron and TIBC    Standing Status:   Standing    Number of Occurrences:   10    Standing Expiration Date:   04/24/2022   Ferritin    Standing Status:   Standing    Number of Occurrences:   10    Standing Expiration Date:   04/24/2022   CMP ([facility] only)    Standing Status:   Standing    Number of Occurrences:   10    Standing Expiration Date:   04/24/2022   Vitamin B12    Standing Status:   Standing    Number of Occurrences:   10    Standing Expiration Date:   04/24/2022    All questions were answered. The patient knows to call the clinic with any problems, questions or concerns. I spent 50 minutes counseling the patient face to face. The total time spent in the appointment was 60 minutes and more than 50% was on counseling.     Truitt Merle, MD 04/24/2021

## 2021-04-24 ENCOUNTER — Inpatient Hospital Stay: Payer: 59 | Attending: Radiation Oncology | Admitting: Hematology

## 2021-04-24 ENCOUNTER — Other Ambulatory Visit: Payer: Self-pay

## 2021-04-24 ENCOUNTER — Inpatient Hospital Stay: Payer: 59

## 2021-04-24 ENCOUNTER — Encounter: Payer: Self-pay | Admitting: Hematology

## 2021-04-24 VITALS — BP 131/50 | HR 94 | Temp 98.2°F | Resp 20 | Ht 62.0 in | Wt 127.2 lb

## 2021-04-24 DIAGNOSIS — C49A3 Gastrointestinal stromal tumor of small intestine: Secondary | ICD-10-CM

## 2021-04-24 DIAGNOSIS — Q8500 Neurofibromatosis, unspecified: Secondary | ICD-10-CM | POA: Diagnosis not present

## 2021-04-24 DIAGNOSIS — Z170 Estrogen receptor positive status [ER+]: Secondary | ICD-10-CM | POA: Diagnosis not present

## 2021-04-24 DIAGNOSIS — D500 Iron deficiency anemia secondary to blood loss (chronic): Secondary | ICD-10-CM

## 2021-04-24 DIAGNOSIS — C50412 Malignant neoplasm of upper-outer quadrant of left female breast: Secondary | ICD-10-CM | POA: Insufficient documentation

## 2021-04-24 DIAGNOSIS — D509 Iron deficiency anemia, unspecified: Secondary | ICD-10-CM | POA: Insufficient documentation

## 2021-04-24 DIAGNOSIS — Z79810 Long term (current) use of selective estrogen receptor modulators (SERMs): Secondary | ICD-10-CM | POA: Diagnosis not present

## 2021-04-24 LAB — CBC WITH DIFFERENTIAL (CANCER CENTER ONLY)
Abs Immature Granulocytes: 0.03 10*3/uL (ref 0.00–0.07)
Basophils Absolute: 0.1 10*3/uL (ref 0.0–0.1)
Basophils Relative: 1 %
Eosinophils Absolute: 0.2 10*3/uL (ref 0.0–0.5)
Eosinophils Relative: 2 %
HCT: 32.1 % — ABNORMAL LOW (ref 36.0–46.0)
Hemoglobin: 10 g/dL — ABNORMAL LOW (ref 12.0–15.0)
Immature Granulocytes: 0 %
Lymphocytes Relative: 20 %
Lymphs Abs: 1.6 10*3/uL (ref 0.7–4.0)
MCH: 24.2 pg — ABNORMAL LOW (ref 26.0–34.0)
MCHC: 31.2 g/dL (ref 30.0–36.0)
MCV: 77.7 fL — ABNORMAL LOW (ref 80.0–100.0)
Monocytes Absolute: 0.7 10*3/uL (ref 0.1–1.0)
Monocytes Relative: 8 %
Neutro Abs: 5.4 10*3/uL (ref 1.7–7.7)
Neutrophils Relative %: 69 %
Platelet Count: 499 10*3/uL — ABNORMAL HIGH (ref 150–400)
RBC: 4.13 MIL/uL (ref 3.87–5.11)
RDW: 14 % (ref 11.5–15.5)
WBC Count: 8 10*3/uL (ref 4.0–10.5)
nRBC: 0 % (ref 0.0–0.2)

## 2021-04-24 LAB — CMP (CANCER CENTER ONLY)
ALT: 11 U/L (ref 0–44)
AST: 15 U/L (ref 15–41)
Albumin: 3.9 g/dL (ref 3.5–5.0)
Alkaline Phosphatase: 40 U/L (ref 38–126)
Anion gap: 9 (ref 5–15)
BUN: 10 mg/dL (ref 6–20)
CO2: 24 mmol/L (ref 22–32)
Calcium: 9.2 mg/dL (ref 8.9–10.3)
Chloride: 109 mmol/L (ref 98–111)
Creatinine: 0.65 mg/dL (ref 0.44–1.00)
GFR, Estimated: >60 mL/min (ref >60)
Glucose, Bld: 101 mg/dL — ABNORMAL HIGH (ref 70–99)
Potassium: 3.7 mmol/L (ref 3.5–5.1)
Sodium: 142 mmol/L (ref 135–145)
Total Bilirubin: 0.3 mg/dL (ref 0.3–1.2)
Total Protein: 7 g/dL (ref 6.5–8.1)

## 2021-04-24 LAB — IRON AND TIBC
Iron: 17 ug/dL — ABNORMAL LOW (ref 41–142)
Saturation Ratios: 4 % — ABNORMAL LOW (ref 21–57)
TIBC: 405 ug/dL (ref 236–444)
UIBC: 388 ug/dL — ABNORMAL HIGH (ref 120–384)

## 2021-04-24 LAB — FERRITIN: Ferritin: 7 ng/mL — ABNORMAL LOW (ref 11–307)

## 2021-04-24 LAB — VITAMIN B12: Vitamin B-12: 345 pg/mL (ref 180–914)

## 2021-04-24 MED ORDER — IMATINIB MESYLATE 400 MG PO TABS
400.0000 mg | ORAL_TABLET | Freq: Every day | ORAL | 3 refills | Status: DC
  Filled 2021-04-24 – 2021-04-30 (×3): qty 30, 30d supply, fill #0
  Filled 2021-05-25: qty 30, 30d supply, fill #1
  Filled 2021-06-25: qty 30, 30d supply, fill #2
  Filled 2021-08-06: qty 30, 30d supply, fill #3

## 2021-04-25 ENCOUNTER — Encounter: Payer: Self-pay | Admitting: Hematology

## 2021-04-25 ENCOUNTER — Telehealth: Payer: Self-pay | Admitting: Hematology

## 2021-04-25 ENCOUNTER — Telehealth: Payer: Self-pay | Admitting: Pharmacist

## 2021-04-25 ENCOUNTER — Other Ambulatory Visit (HOSPITAL_COMMUNITY): Payer: Self-pay

## 2021-04-25 NOTE — Telephone Encounter (Signed)
Scheduled follow-up appointment per 8/9 los. Patient is aware. 

## 2021-04-25 NOTE — Telephone Encounter (Signed)
Oral Oncology Pharmacist Encounter  Received new prescription for Gleevec (imatinib) for the adjuvant treatment of GIST, planned duration of 3 years.  Prescription dose and frequency assessed for appropriateness. Appropriate for therapy initiation.   CBC w/ Diff and CMP from 04/24/21 assessed, no relevant lab abnormalities noted.  Current medication list in Epic reviewed, DDIs with Gleevec identified: Category D DDI between [place] and ibuprofen - limited data has shown ibuprofen may decrease serum concentrations of Gleevec - would recommend patient avoid use of ibuprofen, if able, while on Gleevec Category D DDI between [place] and alprazolam - Gleevec, a CYP3A4 inhibitor may increase serum concentrations of alprazolam. Noted patient only taking PRN - recommend monitoring for increased sedation/somnolence while on combination.   Evaluated chart and no patient barriers to medication adherence noted.   Patient agreement for treatment documented in MD note on 04/24/21.  Prescription has been e-scribed to the [facility] for benefits analysis and approval.  Oral Oncology Clinic will continue to follow for insurance authorization, copayment issues, initial counseling and start date.  Leron Croak, PharmD, BCPS Hematology/Oncology Clinical Pharmacist [facility] [phone] 04/25/2021 8:39 AM

## 2021-04-25 NOTE — Telephone Encounter (Signed)
Oral Oncology Pharmacist Encounter   Received notification from [place] that prior authorization for Sun (imatinib) is required.   PA submitted on CoverMyMeds Key: [id number] Status is pending   Oral Oncology Clinic will continue to follow.   Leron Croak, PharmD, BCPS Hematology/Oncology Clinical Pharmacist [facility] [phone] 04/25/2021 8:23 AM

## 2021-04-26 ENCOUNTER — Telehealth: Payer: Self-pay | Admitting: Nurse Practitioner

## 2021-04-26 ENCOUNTER — Other Ambulatory Visit (HOSPITAL_COMMUNITY): Payer: Self-pay

## 2021-04-26 NOTE — Telephone Encounter (Signed)
Scheduled appts per 8/11 sch msg. Pt aware.

## 2021-04-26 NOTE — Addendum Note (Signed)
Addended by: Alla Feeling on: 04/26/2021 01:30 PM   Modules accepted: Orders

## 2021-04-26 NOTE — Telephone Encounter (Signed)
Oral Oncology Patient Advocate Encounter  Prior Authorization for [place] has been approved.    PA# [id number] Effective dates: 04/25/21 through 04/24/22  Patients co-pay is $15  Oral Oncology Clinic will continue to follow.   [facility] Patient [place] Phone [phone] Fax [phone] 04/26/2021 8:24 AM

## 2021-04-29 ENCOUNTER — Other Ambulatory Visit: Payer: Self-pay | Admitting: Hematology

## 2021-04-29 DIAGNOSIS — C49A3 Gastrointestinal stromal tumor of small intestine: Secondary | ICD-10-CM

## 2021-04-30 ENCOUNTER — Other Ambulatory Visit (HOSPITAL_COMMUNITY): Payer: Self-pay

## 2021-04-30 NOTE — Telephone Encounter (Signed)
Oral Chemotherapy Pharmacist Encounter  I spoke with patient for overview of Gleevec (imatinib) for the adjuvant treatment of GIST, planned duration of 3 years.   Counseled patient on administration, dosing, side effects, monitoring, drug-food interactions, safe handling, storage, and disposal.  Patient will take Gleevec '400mg'$  tablets, 1 tablet ('400mg'$ ) by mouth once daily with a meal and a large glass of water.  Patient knows food may decrease stomach irritation and to maintain hydration while on treatment with Gleevec.  Patient counseled to avoid grapefruit and grapefruit juice.  Gleevec start date: 05/07/21  Adverse effects include but are not limited to: nausea, vomiting, diarrhea, fatigue, muscle cramps, lower extremity edema, rash, decreased blood counts, GI bleeding, and cardiac dysfunction.  Patient will obtain anti diarrheal and alert the office of 4 or more loose stools above baseline.  Reviewed with patient importance of keeping a medication schedule and plan for any missed doses. No barriers to medication adherence identified.  Medication reconciliation performed and medication/allergy list updated. Discussed with patient drug-drug interaction between [place] and alprazolam and risk for increased side effects from alprazolam. Patient aware to monitor for increased sedation/somnolence and will alert prescribing MD if dose needs to be reduced. Discussed limited data between ibuprofen possibly decreasing serum concentrations of Gleevec. Patient only takes PRN, but will utilize Aleve PRN which is not noted to have this interaction.   Insurance authorization for [employer] has been obtained. Test claim at the pharmacy revealed copayment $15 for 1st fill of Gleevec. Patient will pick this up from the [facility] on 05/01/21.  Patient informed the pharmacy will reach out 5-7 days prior to needing next fill of Gleevec to coordinate continued medication acquisition to  prevent break in therapy.  All questions answered.  Ms. Kamaka voiced understanding and appreciation.   Medication education handout placed in mail for patient. Patient knows to call the office with questions or concerns. Oral Chemotherapy Clinic phone number provided to patient.   Leron Croak, PharmD, BCPS Hematology/Oncology Clinical Pharmacist [facility] [phone] 04/30/2021 11:14 AM

## 2021-05-01 ENCOUNTER — Other Ambulatory Visit: Payer: Self-pay

## 2021-05-01 ENCOUNTER — Inpatient Hospital Stay: Payer: 59

## 2021-05-01 VITALS — BP 135/74 | HR 74 | Temp 98.2°F | Resp 18

## 2021-05-01 DIAGNOSIS — C50412 Malignant neoplasm of upper-outer quadrant of left female breast: Secondary | ICD-10-CM | POA: Diagnosis not present

## 2021-05-01 DIAGNOSIS — Z170 Estrogen receptor positive status [ER+]: Secondary | ICD-10-CM | POA: Diagnosis not present

## 2021-05-01 DIAGNOSIS — Z79810 Long term (current) use of selective estrogen receptor modulators (SERMs): Secondary | ICD-10-CM | POA: Diagnosis not present

## 2021-05-01 DIAGNOSIS — D509 Iron deficiency anemia, unspecified: Secondary | ICD-10-CM | POA: Diagnosis not present

## 2021-05-01 DIAGNOSIS — C49A3 Gastrointestinal stromal tumor of small intestine: Secondary | ICD-10-CM | POA: Diagnosis not present

## 2021-05-01 DIAGNOSIS — Q8500 Neurofibromatosis, unspecified: Secondary | ICD-10-CM | POA: Diagnosis not present

## 2021-05-01 DIAGNOSIS — D508 Other iron deficiency anemias: Secondary | ICD-10-CM

## 2021-05-01 MED ORDER — SODIUM CHLORIDE 0.9 % IV SOLN
10.0000 mg | Freq: Once | INTRAVENOUS | Status: AC
  Administered 2021-05-01: 10 mg via INTRAVENOUS
  Filled 2021-05-01: qty 10

## 2021-05-01 MED ORDER — SODIUM CHLORIDE 0.9 % IV SOLN
300.0000 mg | Freq: Once | INTRAVENOUS | Status: AC
  Administered 2021-05-01: 300 mg via INTRAVENOUS
  Filled 2021-05-01: qty 300

## 2021-05-01 MED ORDER — ACETAMINOPHEN 500 MG PO TABS
1000.0000 mg | ORAL_TABLET | Freq: Once | ORAL | Status: AC
  Administered 2021-05-01: 1000 mg via ORAL
  Filled 2021-05-01: qty 2

## 2021-05-01 MED ORDER — FAMOTIDINE 20 MG IN NS 100 ML IVPB
20.0000 mg | Freq: Once | INTRAVENOUS | Status: AC
  Administered 2021-05-01: 20 mg via INTRAVENOUS
  Filled 2021-05-01: qty 100

## 2021-05-01 MED ORDER — SODIUM CHLORIDE 0.9 % IV SOLN: Freq: Once | INTRAVENOUS | Status: AC

## 2021-05-01 NOTE — Patient Instructions (Signed)

## 2021-05-01 NOTE — Progress Notes (Signed)
Patient notified RN of pain at IV site.  RN noticed redness and swelling.  Infusion stopped by RN and vitals obtained.  VSS.

## 2021-05-02 ENCOUNTER — Other Ambulatory Visit: Payer: Self-pay

## 2021-05-02 NOTE — Progress Notes (Signed)
Asked by patient's primary RN to evaluate IV site after noting swelling/redness. On assessment, IV site appeared to be infiltrated with surrounding tenderness and reddish-brown discoloration extending proximal and lateral to insertion site. During assessment of the IV site, patient began c/o feeling hot and "not feeling very good." She became pale and diaphoretic. While preparing to obtain another IV site, patient experienced brief syncopal episode with mild seizure-like activity. IV site obtained and IV NS initiated at wide open rate. Initial BP during event revealed SBP [id number] systolic. Patient quickly became responsive to verbal stimuli which quickly improved to being awake and alert. During IV bolus, patient reported she was feeling better. Dr. Burr Medico came to infusion room to evaluate. At that time, care turned over to [facility], Therapist, sports.

## 2021-05-02 NOTE — Progress Notes (Signed)
The proposed treatment discussed in conference is for discussion purpose only and is not a binding recommendation.  The patients have not been physically examined, or presented with their treatment options.  Therefore, final treatment plans cannot be decided.  

## 2021-05-07 MED FILL — Dexamethasone Sodium Phosphate Inj 100 MG/10ML: INTRAMUSCULAR | Qty: 1 | Status: AC

## 2021-05-08 ENCOUNTER — Inpatient Hospital Stay: Payer: 59

## 2021-05-08 ENCOUNTER — Other Ambulatory Visit: Payer: Self-pay

## 2021-05-08 VITALS — BP 116/57 | HR 100 | Temp 98.7°F | Resp 18

## 2021-05-08 DIAGNOSIS — C49A3 Gastrointestinal stromal tumor of small intestine: Secondary | ICD-10-CM

## 2021-05-08 DIAGNOSIS — D508 Other iron deficiency anemias: Secondary | ICD-10-CM

## 2021-05-08 DIAGNOSIS — Z170 Estrogen receptor positive status [ER+]: Secondary | ICD-10-CM | POA: Diagnosis not present

## 2021-05-08 DIAGNOSIS — D509 Iron deficiency anemia, unspecified: Secondary | ICD-10-CM | POA: Diagnosis not present

## 2021-05-08 DIAGNOSIS — Q8500 Neurofibromatosis, unspecified: Secondary | ICD-10-CM | POA: Diagnosis not present

## 2021-05-08 DIAGNOSIS — C50412 Malignant neoplasm of upper-outer quadrant of left female breast: Secondary | ICD-10-CM | POA: Diagnosis not present

## 2021-05-08 DIAGNOSIS — Z79810 Long term (current) use of selective estrogen receptor modulators (SERMs): Secondary | ICD-10-CM | POA: Diagnosis not present

## 2021-05-08 MED ORDER — DIPHENHYDRAMINE HCL 50 MG/ML IJ SOLN: 50.0000 mg | Freq: Once | INTRAMUSCULAR | Status: AC | PRN

## 2021-05-08 MED ORDER — DEXAMETHASONE SODIUM PHOSPHATE 10 MG/ML IJ SOLN: 4.0000 mg | Freq: Once | INTRAMUSCULAR | Status: DC

## 2021-05-08 MED ORDER — SODIUM CHLORIDE 0.9 % IV SOLN: Freq: Once | INTRAVENOUS | Status: AC

## 2021-05-08 MED ORDER — SODIUM CHLORIDE 0.9 % IV SOLN
4.0000 mg | Freq: Once | INTRAVENOUS | Status: DC
  Administered 2021-05-08: 4 mg via INTRAVENOUS

## 2021-05-08 MED ORDER — DIPHENHYDRAMINE HCL 50 MG/ML IJ SOLN
25.0000 mg | INTRAMUSCULAR | Status: AC
  Administered 2021-05-08: 25 mg via INTRAVENOUS

## 2021-05-08 MED ORDER — ACETAMINOPHEN 500 MG PO TABS
1000.0000 mg | ORAL_TABLET | Freq: Once | ORAL | Status: AC
  Administered 2021-05-08: 1000 mg via ORAL
  Filled 2021-05-08: qty 2

## 2021-05-08 MED ORDER — SODIUM CHLORIDE 0.9 % IV SOLN
300.0000 mg | Freq: Once | INTRAVENOUS | Status: AC
  Administered 2021-05-08: 300 mg via INTRAVENOUS
  Filled 2021-05-08: qty 300

## 2021-05-08 MED ORDER — DEXAMETHASONE SODIUM PHOSPHATE 10 MG/ML IJ SOLN
INTRAMUSCULAR | Status: AC
  Filled 2021-05-08: qty 1

## 2021-05-08 MED ORDER — DEXAMETHASONE 4 MG PO TABS
ORAL_TABLET | ORAL | Status: AC
  Filled 2021-05-08: qty 1

## 2021-05-08 MED ORDER — DIPHENHYDRAMINE HCL 50 MG/ML IJ SOLN
INTRAMUSCULAR | Status: AC
  Administered 2021-05-08: 25 mg via INTRAVENOUS
  Filled 2021-05-08: qty 1

## 2021-05-08 MED ORDER — SODIUM CHLORIDE 0.9 % IV SOLN
10.0000 mg | Freq: Once | INTRAVENOUS | Status: AC
  Administered 2021-05-08: 10 mg via INTRAVENOUS
  Filled 2021-05-08: qty 10

## 2021-05-08 MED ORDER — FAMOTIDINE 20 MG IN NS 100 ML IVPB
20.0000 mg | Freq: Once | INTRAVENOUS | Status: AC
  Administered 2021-05-08: 20 mg via INTRAVENOUS
  Filled 2021-05-08: qty 100

## 2021-05-08 NOTE — Addendum Note (Signed)
Addended by: Marcell Anger D on: 05/08/2021 12:53 PM   Modules accepted: Orders

## 2021-05-08 NOTE — Progress Notes (Signed)
Pt discharged after fluids, pt reported feeling better and a decrease in urticaria. Pt discharged in stable condition, ambulatory to lobby.

## 2021-05-08 NOTE — Progress Notes (Signed)
After patient was discharged, she returned to the infusion room with urticaria to RUE. Patient denied itching or dyspnea. IV site obtained and Dr. Lindi Adie contacted for further orders. Dr. Lindi Adie gave additional medication orders and patient was medicated as documented in [facility]. Patient's urticaria continued with additional sites presenting on bilateral lower legs and left arm. Patient also began c/o swelling to left hand. Additional diphenhydramine administered per Dr. Geralyn Flash orders. Patient exhibited rapid improvement with 2nd dose of diphenhydramine.

## 2021-05-08 NOTE — Addendum Note (Signed)
Addended by: Sinda Du on: 05/08/2021 01:06 PM   Modules accepted: Orders

## 2021-05-08 NOTE — Addendum Note (Signed)
Addended by: Carolynne Edouard B on: 05/08/2021 01:17 PM   Modules accepted: Orders

## 2021-05-08 NOTE — Patient Instructions (Signed)

## 2021-05-09 ENCOUNTER — Ambulatory Visit: Payer: 59

## 2021-05-11 ENCOUNTER — Telehealth: Payer: Self-pay | Admitting: Hematology

## 2021-05-11 ENCOUNTER — Encounter: Payer: Self-pay | Admitting: Radiology

## 2021-05-11 NOTE — Telephone Encounter (Signed)
Anne Horton had severe reaction to iv Venofer last time (second dose). I will cancel her next two infusions, and check her CBC and iron level next week to see if she still needs iv iron, if yes, will change to other iv iron products. I called her and discussed the above, she is in agreement.   Truitt Merle  05/11/2021

## 2021-05-12 NOTE — Progress Notes (Signed)
Radiation Oncology         [phone] ________________________________  Name: Anne Horton [facility] MRN: [id number]  Date: 05/14/2021  DOB: [birth year]  Follow-Up Visit Note  CC: Osie Cheeks, PA-C  Osie Cheeks, [place]*    ICD-10-CM   1. Malignant neoplasm of upper-outer quadrant of left breast in female, estrogen receptor positive ([place])  C50.412    Z17.0       Diagnosis: Stage I invasive ductal carcinoma of the left breast (pT1c, pN0)  New gastrointestinal stromal tumors of the small bowel; malignant   Interval Since Last Radiation:  2 years, 9 months, and 4 days    Radiation treatment dates: 06/24/2018 to 08/10/2018   Site/dose:    1. The Left breast was treated to 50.4 Gy in 28 fractions of 1.8 Gy. 2. The Left breast was boosted to 10 Gy in 5 fractions of 2 Gy.   Narrative:  The patient returns today for routine follow-up, she was last seen by me on 10/05/20. Since her last visit, the patient presented to the [facility] ED on 02/17/21 with 1 week of left lower abdominal pain and ongoing chronic constipation. ROS performed at this time revealed the patient to be positive for abdominal distention and abdominal pain. Physical exam performed indicated abdominal tenderness in her suprapubic area and LLQ as well as guarding. CT of the abdomen and pelvis performed during ED course demonstrated a large cystic mass in the left mid to lower abdomen measuring 14.6 x 15.7 x 11.1 cm; noted as possibly arising eccentrically from the left ovary. Fluid was noted to surround the left ovary tracking from the mass; with the fluid tracking along the left abdomen and pelvis into the cul-de-sac. (Fluid noted to be suspected leakage from the large left-sided cystic mass). Differential considerations included cystic ovarian neoplasm or predominantly cystic neoplasm arising from the mesentery. Additional cystic mass noted anterior to the inferior uterus impressing upon the urinary bladder measuring  5.7 x 2.4 x 2.8 cm. Etiology of the mass is uncertain; noted to possibly arise from the right ovary.    Accordingly, the patient consulted with Dr. Denman George on 02/19/21 for further evaluation. Transvaginal pelvic US performed for further evaluation of abdominal mass again revealed the 14.8 cm complex cystic mass in the left mid abdomen as visibly separate/distinct from the left ovary. Mass again noted as suspicious for intra-abdominal cystic/necrotic neoplasm.   Abdominal MRI performed on 02/24/21 demonstrated the large complex enhancing mass in the left abdomen as consistent with malignancy. Mass was noted to associate with peripheral nodular enhancement with adjacent soft tissue component superomedially. Differential considerations included malignant GIST (high risk for patient's with NF-1) and primary peritoneal serous carcinoma.  Subsequently, the patient was referred to Dr. Barry Dienes and underwent small bowel resectioning and omentectomy on 04/03/21. Pathology from the procedure revealed multiple gastrointestinal stromal tumors; with the largest measuring 17.5 cm (high grade). 3 small tumors in the small bowel were not able to be resected.  The patient was then referred to Dr. Burr Medico on 04/24/21 for consultation. During this visit, the patient was noted to be recovering well from surgery and regained her appetite. Due to the high risk of recurrence, Dr. Burr Medico recommend adjuvant imatinib 400 mg daily for 3 years or longer if her residual tumor does not disappear after adjuvant therapy.   Of note: bilateral mammogram performed on 03/21/21 demonstrated no mammographic evidence of malignancy involving either breast.  She denies any pain within either breast.  Denies  any nipple discharge or bleeding.    Allergies:  is allergic to latex.  Meds: Current Outpatient Medications  Medication Sig Dispense Refill   ALPRAZolam (XANAX) 0.5 MG tablet Take 1/2 to 1 tablet by mouth 2 times a day as needed (Patient  taking differently: Take 0.25-0.5 mg by mouth 2 (two) times daily as needed for anxiety.) 180 tablet 1   cetirizine (ZYRTEC) 10 MG tablet Take 10 mg by mouth daily.     gabapentin (NEURONTIN) 100 MG capsule Take 1 capsule (100 mg total) by mouth at bedtime. (Patient taking differently: Take 100 mg by mouth at bedtime as needed (pain).) 30 capsule 3   imatinib (GLEEVEC) 400 MG tablet Take 1 tablet (400 mg total) by mouth daily. Take with meals and large glass of water.Caution:Chemotherapy. 30 tablet 3   metoprolol succinate (TOPROL-XL) 50 MG 24 hr tablet TAKE 1 TABLET BY MOUTH DAILY. TAKE WITH OR IMMEDIATELY FOLLOWING A MEAL. (Patient taking differently: Take 50 mg by mouth daily.) 90 tablet 3   Multiple Vitamin (MULTIVITAMIN) tablet Take 1 tablet by mouth daily.     propranolol (INDERAL) 10 MG tablet TAKE 1 TABLET BY MOUTH FOUR TIMES DAILY AS NEEDED FOR PALPITATIONS (Patient taking differently: Take 10 mg by mouth 4 (four) times daily as needed (palpitations).) 120 tablet 3   senna-docusate (SENOKOT-S) 8.6-50 MG tablet Take 1 tablet by mouth daily as needed for mild constipation.     tamoxifen (NOLVADEX) 20 MG tablet TAKE 1 TABLET BY MOUTH ONCE A DAY (Patient not taking: No sig reported) 90 tablet 3   venlafaxine XR (EFFEXOR-XR) 75 MG 24 hr capsule Take 1 capsule (75 mg total) by mouth daily. 90 capsule 1   No current facility-administered medications for this encounter.    Physical Findings: The patient is in no acute distress. Patient is alert and oriented.  height is '5\' 2"'$  (1.575 m) and weight is 130 lb 6 oz (59.1 kg). Her temporal temperature is 97.4 F (36.3 C) (abnormal). Her blood pressure is 122/39 (abnormal) and her pulse is 93. Her respiration is 18 and oxygen saturation is 100%. .  No significant changes. Lungs are clear to auscultation bilaterally. Heart has regular rate and rhythm. No palpable cervical, supraclavicular, or axillary adenopathy. Abdomen soft, non-tender, normal bowel  sounds.  The right breast area shows no palpable mass nipple discharge or bleeding.  Scars along the anterior aspect of the breast from prior reduction mammoplasty.  The left breast area shows scars along the inferior aspect from her prior reduction mammoplasty.  Lumpectomy scar noted.  No palpable mass within the breast nipple discharge or bleeding.     Lab Findings: Lab Results  Component Value Date   WBC 8.0 04/24/2021   HGB 10.0 (L) 04/24/2021   HCT 32.1 (L) 04/24/2021   MCV 77.7 (L) 04/24/2021   PLT 499 (H) 04/24/2021    Radiographic Findings: No results found.  Impression:  Stage I invasive ductal carcinoma of the left breast (pT1c, pN0)  New gastrointestinal stromal tumors of the small bowel; malignant, incomplete resection.  No evidence of recurrence on clinical exam today of the patient's breast cancer.  She is not bothered with any further lymphedema of the breast since her breast reduction.  She denies any pain within the breast at this time.  Plan: As needed follow-up in radiation oncology.  She will continue close follow-up with Dr. Lindi Adie  concerning her breast cancer and Dr. Burr Medico concerning her GIST.    ____________________________________  Jeneen Rinks  Jabier Gauss, PhD, MD   This document serves as a record of services personally performed by [name], MD. It was created on his behalf by Roney Mans, a trained medical scribe. The creation of this record is based on the scribe's personal observations and the provider's statements to them. This document has been checked and approved by the attending provider.

## 2021-05-14 ENCOUNTER — Ambulatory Visit
Admission: RE | Admit: 2021-05-14 | Discharge: 2021-05-14 | Disposition: A | Discharge status: Home / Self Care | Payer: 59 | Source: Ambulatory Visit | Attending: Radiation Oncology | Admitting: Radiation Oncology

## 2021-05-14 ENCOUNTER — Other Ambulatory Visit: Payer: Self-pay

## 2021-05-14 ENCOUNTER — Other Ambulatory Visit: Payer: Self-pay | Admitting: Genetic Counselor

## 2021-05-14 ENCOUNTER — Encounter: Payer: Self-pay | Admitting: Radiation Oncology

## 2021-05-14 ENCOUNTER — Telehealth: Payer: Self-pay | Admitting: Hematology

## 2021-05-14 DIAGNOSIS — Z08 Encounter for follow-up examination after completed treatment for malignant neoplasm: Secondary | ICD-10-CM | POA: Diagnosis not present

## 2021-05-14 DIAGNOSIS — Z170 Estrogen receptor positive status [ER+]: Secondary | ICD-10-CM | POA: Diagnosis not present

## 2021-05-14 DIAGNOSIS — C49A3 Gastrointestinal stromal tumor of small intestine: Secondary | ICD-10-CM

## 2021-05-14 DIAGNOSIS — Z923 Personal history of irradiation: Secondary | ICD-10-CM | POA: Diagnosis not present

## 2021-05-14 DIAGNOSIS — C50412 Malignant neoplasm of upper-outer quadrant of left female breast: Secondary | ICD-10-CM | POA: Insufficient documentation

## 2021-05-14 DIAGNOSIS — Z79899 Other long term (current) drug therapy: Secondary | ICD-10-CM | POA: Insufficient documentation

## 2021-05-14 DIAGNOSIS — R109 Unspecified abdominal pain: Secondary | ICD-10-CM | POA: Insufficient documentation

## 2021-05-14 NOTE — Telephone Encounter (Signed)
Scheduled appointment per 08/27 sch msg. Patient is aware.

## 2021-05-14 NOTE — Progress Notes (Signed)
Anne Horton is here today for follow up post radiation to the breast.   Breast Side: Left   They completed their radiation on: 08/10/2018  Does the patient complain of any of the following: Post radiation skin issues: Skin intact Breast Tenderness: no Breast Swelling: no Lymphadema: Patient reports improvement.  Range of Motion limitations: Full Fatigue post radiation: Patient reports energy level is improving Appetite good/fair/poor: Good  Additional comments if applicable:   Vitals:   05/14/21 1000  BP: (!) 122/39  Pulse: 93  Resp: 18  Temp: (!) 97.4 F (36.3 C)  TempSrc: Temporal  SpO2: 100%  Weight: 130 lb 6 oz (59.1 kg)  Height: '5\' 2"'$  (1.575 m)

## 2021-05-15 ENCOUNTER — Inpatient Hospital Stay: Payer: 59

## 2021-05-15 ENCOUNTER — Ambulatory Visit (HOSPITAL_COMMUNITY)
Admission: RE | Admit: 2021-05-15 | Discharge: 2021-05-15 | Disposition: A | Discharge status: Home / Self Care | Payer: 59 | Source: Ambulatory Visit | Attending: Hematology | Admitting: Hematology

## 2021-05-15 DIAGNOSIS — R918 Other nonspecific abnormal finding of lung field: Secondary | ICD-10-CM | POA: Diagnosis not present

## 2021-05-15 DIAGNOSIS — C49A0 Gastrointestinal stromal tumor, unspecified site: Secondary | ICD-10-CM | POA: Diagnosis not present

## 2021-05-15 DIAGNOSIS — J984 Other disorders of lung: Secondary | ICD-10-CM | POA: Diagnosis not present

## 2021-05-15 DIAGNOSIS — C49A3 Gastrointestinal stromal tumor of small intestine: Secondary | ICD-10-CM

## 2021-05-15 DIAGNOSIS — D500 Iron deficiency anemia secondary to blood loss (chronic): Secondary | ICD-10-CM

## 2021-05-15 LAB — IRON AND TIBC
Iron: 76 ug/dL (ref 41–142)
Saturation Ratios: 24 % (ref 21–57)
TIBC: 315 ug/dL (ref 236–444)
UIBC: 239 ug/dL (ref 120–384)

## 2021-05-15 LAB — GENETIC SCREENING ORDER

## 2021-05-15 LAB — FERRITIN: Ferritin: 328 ng/mL — ABNORMAL HIGH (ref 11–307)

## 2021-05-16 ENCOUNTER — Ambulatory Visit: Payer: 59

## 2021-05-19 ENCOUNTER — Encounter: Payer: Self-pay | Admitting: Hematology

## 2021-05-23 ENCOUNTER — Ambulatory Visit: Payer: 59

## 2021-05-25 ENCOUNTER — Other Ambulatory Visit (HOSPITAL_COMMUNITY): Payer: Self-pay

## 2021-05-25 MED FILL — Metoprolol Succinate Tab ER 24HR 50 MG (Tartrate Equiv): ORAL | 90 days supply | Qty: 90 | Fill #1 | Status: AC

## 2021-05-28 ENCOUNTER — Other Ambulatory Visit (HOSPITAL_COMMUNITY): Payer: Self-pay

## 2021-05-29 ENCOUNTER — Other Ambulatory Visit: Payer: Self-pay

## 2021-05-29 ENCOUNTER — Inpatient Hospital Stay: Payer: 59

## 2021-05-29 ENCOUNTER — Inpatient Hospital Stay: Payer: 59 | Attending: Radiation Oncology | Admitting: Hematology

## 2021-05-29 ENCOUNTER — Encounter: Payer: Self-pay | Admitting: Hematology

## 2021-05-29 ENCOUNTER — Telehealth: Payer: Self-pay | Admitting: Hematology

## 2021-05-29 VITALS — BP 133/47 | HR 73 | Temp 97.8°F | Resp 18 | Ht 62.0 in | Wt 133.4 lb

## 2021-05-29 DIAGNOSIS — C49A3 Gastrointestinal stromal tumor of small intestine: Secondary | ICD-10-CM | POA: Insufficient documentation

## 2021-05-29 DIAGNOSIS — C50412 Malignant neoplasm of upper-outer quadrant of left female breast: Secondary | ICD-10-CM | POA: Insufficient documentation

## 2021-05-29 DIAGNOSIS — R918 Other nonspecific abnormal finding of lung field: Secondary | ICD-10-CM | POA: Diagnosis not present

## 2021-05-29 DIAGNOSIS — D509 Iron deficiency anemia, unspecified: Secondary | ICD-10-CM | POA: Diagnosis not present

## 2021-05-29 DIAGNOSIS — Z170 Estrogen receptor positive status [ER+]: Secondary | ICD-10-CM | POA: Diagnosis not present

## 2021-05-29 DIAGNOSIS — Q8500 Neurofibromatosis, unspecified: Secondary | ICD-10-CM | POA: Diagnosis not present

## 2021-05-29 DIAGNOSIS — Z923 Personal history of irradiation: Secondary | ICD-10-CM | POA: Insufficient documentation

## 2021-05-29 DIAGNOSIS — D500 Iron deficiency anemia secondary to blood loss (chronic): Secondary | ICD-10-CM

## 2021-05-29 DIAGNOSIS — Z79899 Other long term (current) drug therapy: Secondary | ICD-10-CM | POA: Diagnosis not present

## 2021-05-29 DIAGNOSIS — Z9049 Acquired absence of other specified parts of digestive tract: Secondary | ICD-10-CM | POA: Diagnosis not present

## 2021-05-29 LAB — CMP (CANCER CENTER ONLY)
ALT: 18 U/L (ref 0–44)
AST: 19 U/L (ref 15–41)
Albumin: 3.6 g/dL (ref 3.5–5.0)
Alkaline Phosphatase: 46 U/L (ref 38–126)
Anion gap: 9 (ref 5–15)
BUN: 8 mg/dL (ref 6–20)
CO2: 23 mmol/L (ref 22–32)
Calcium: 8.6 mg/dL — ABNORMAL LOW (ref 8.9–10.3)
Chloride: 110 mmol/L (ref 98–111)
Creatinine: 0.74 mg/dL (ref 0.44–1.00)
GFR, Estimated: >60 mL/min (ref >60)
Glucose, Bld: 87 mg/dL (ref 70–99)
Potassium: 3.7 mmol/L (ref 3.5–5.1)
Sodium: 142 mmol/L (ref 135–145)
Total Bilirubin: 0.3 mg/dL (ref 0.3–1.2)
Total Protein: 6.1 g/dL — ABNORMAL LOW (ref 6.5–8.1)

## 2021-05-29 LAB — CBC WITH DIFFERENTIAL (CANCER CENTER ONLY)
Abs Immature Granulocytes: 0.01 10*3/uL (ref 0.00–0.07)
Basophils Absolute: 0.1 10*3/uL (ref 0.0–0.1)
Basophils Relative: 1 %
Eosinophils Absolute: 0.1 10*3/uL (ref 0.0–0.5)
Eosinophils Relative: 2 %
HCT: 35.5 % — ABNORMAL LOW (ref 36.0–46.0)
Hemoglobin: 10.9 g/dL — ABNORMAL LOW (ref 12.0–15.0)
Immature Granulocytes: 0 %
Lymphocytes Relative: 29 %
Lymphs Abs: 1.5 10*3/uL (ref 0.7–4.0)
MCH: 24.5 pg — ABNORMAL LOW (ref 26.0–34.0)
MCHC: 30.7 g/dL (ref 30.0–36.0)
MCV: 80 fL (ref 80.0–100.0)
Monocytes Absolute: 0.4 10*3/uL (ref 0.1–1.0)
Monocytes Relative: 8 %
Neutro Abs: 3 10*3/uL (ref 1.7–7.7)
Neutrophils Relative %: 60 %
Platelet Count: 325 10*3/uL (ref 150–400)
RBC: 4.44 MIL/uL (ref 3.87–5.11)
RDW: 20.5 % — ABNORMAL HIGH (ref 11.5–15.5)
WBC Count: 5.1 10*3/uL (ref 4.0–10.5)
nRBC: 0 % (ref 0.0–0.2)

## 2021-05-29 LAB — IRON AND TIBC
Iron: 71 ug/dL (ref 28–170)
Saturation Ratios: 21 % (ref 10.4–31.8)
TIBC: 338 ug/dL (ref 250–450)
UIBC: 267 ug/dL

## 2021-05-29 LAB — FERRITIN: Ferritin: 112 ng/mL (ref 11–307)

## 2021-05-29 NOTE — Telephone Encounter (Signed)
Scheduled follow-up appointment per 9/13 los. Patient is aware. 

## 2021-05-29 NOTE — Progress Notes (Signed)
[place]   Telephone:(336) [phone] Fax:(336) [phone]   Clinic Follow up Note   Patient Care Team: Daphene Calamity as PCP - General (Family Medicine) [name], Wonda Cheng, MD as PCP - Cardiology (Cardiology) [name], MD as Consulting Physician (Radiation Oncology) Nicholas Lose, MD as Consulting Physician (Hematology and Oncology) Rolm Bookbinder, MD as Consulting Physician (General Surgery)  Date of Service:  05/29/2021  CHIEF COMPLAINT: f/u of GIST of small bowel  CURRENT THERAPY:  Imatinib, 400 mg daily, starting 05/07/21  ASSESSMENT & PLAN:  Anne Horton is a 45 y.o. female with   1. Multifocal GIST of small bowel, pT4N0Mo, stage IIIB, high grade, mitotic rate 9/HPF -initially presented to ED on 02/17/21 with worsening abdominal pain and nausea. CT showed 15.7 cm mass in left mid to lower abdomen, no metastatic disease. MRI showed separation of the mass and the left ovary, and GIST felt to be likely.  -resection on 02/26/21 under Dr. Barry Dienes showed multiple tumor in small bowel, with three unable to be resected. Path confirmed GIST, high grade, margins and lymph nodes were negative. -staging chest CT 05/15/21 was negative. -she began imatinib 400 mg daily on 05/07/21. She is tolerating well without issue.   2. NF-1 -She will see genetics today -We discussed that neurofibromatosis is high risk for GIST    3. Iron deficient anemia -Likely secondary to tumor bleeding from GIST -She previously received IV iron venofer and had vaso-vagal like reaction at the end of second dose -We will monitor iron level   4.  Malignant neoplasm of upper-outer quadrant of left breast, ER+/PR+/HER2-, stage I (pT1cN0) -Oncotype DX score 11 -Status post lumpectomy in August 2019 and adjuvant radiation -She is on adjuvant tamoxifen, tolerating well. -most recent mammogram 03/21/21 was negative.    Plan -continue imatinib 400 mg daily -Lab and follow-up in 2  months   No problem-specific Assessment & Plan notes found for this encounter.   SUMMARY OF ONCOLOGIC HISTORY: Oncology History Overview Note  Cancer Staging GIST (gastrointestinal stromal tumor) of small bowel, malignant (HCC) Staging form: Gastrointestinal Stromal Tumor - Small Intestinal, Esophageal, Colorectal, Mesenteric, and Peritoneal GIST, AJCC 8th Edition - Pathologic stage from 04/03/2021: Stage IIIB (pT4, pN0, cM0, Mitotic Rate: High) - Signed by Truitt Merle, MD on 04/23/2021  Malignant neoplasm of upper-outer quadrant of left breast in female, estrogen receptor positive ([place]) Staging form: Breast, AJCC 8th Edition - Clinical stage from 03/20/2018: Stage IA (cT1c, cN0, cM0, G2, ER+, PR+, HER2-) - Signed by Gardenia Phlegm, NP on 05/27/2018 - Pathologic stage from 05/07/2018: Stage IA (pT1c, pN0, cM0, G1, ER+, PR+, HER2-, Oncotype DX score: 11) - Signed by Gardenia Phlegm, NP on 05/27/2018    Malignant neoplasm of upper-outer quadrant of left breast in female, estrogen receptor positive ([place])  03/20/2018 Initial Diagnosis   Screening detected left breast spiculated mass by ultrasound measured 1.2 cm.  Biopsy revealed invasive ductal carcinoma grade 1-2 with high-grade DCIS, ER 95%, PR 95%, Ki-67 1%, HER-2 negative ratio 1.47, T1CN0 stage I a clinical stage   03/20/2018 Cancer Staging   Staging form: Breast, AJCC 8th Edition - Clinical stage from 03/20/2018: Stage IA (cT1c, cN0, cM0, G2, ER+, PR+, HER2-) - Signed by Gardenia Phlegm, NP on 05/27/2018   04/08/2018 Genetic Testing   VUS in NF1, otherwise negative.  Genes tested: APC, ATM, AXIN2, BARD1, BMPR1A, BRCA1, BRCA2, BRIP1, CDH1, CDKN2A (p14ARF), CDKN2A (p16INK4a), CKD4, CHEK2, CTNNA1, DICER1, EPCAM (Deletion/duplication testing only),  GREM1 (promoter region deletion/duplication testing only), KIT, MEN1, MLH1, MSH2, MSH3, MSH6, MUTYH, NBN, NF1, NHTL1, PALB2, PDGFRA, PMS2, POLD1, POLE, PTEN, RAD50, RAD51C, RAD51D,  SDHB, SDHC, SDHD, SMAD4, SMARCA4. STK11, TP53, TSC1, TSC2, and VHL.  The following genes were evaluated for sequence changes only: SDHA and HOXB13 c.251G>A variant only.   05/07/2018 Surgery   Left lumpectomy: IDC grade 1, 1.2 cm, intermediate grade DCIS, margins negative,PASH, 0/7 sentinel lymph nodes negative, ER 95%, PR 95%, Ki-67 1%, HER-2 negative ratio 1.47, T1CN0 stage Ia   05/07/2018 Cancer Staging   Staging form: Breast, AJCC 8th Edition - Pathologic stage from 05/07/2018: Stage IA (pT1c, pN0, cM0, G1, ER+, PR+, HER2-, Oncotype DX score: 11) - Signed by Gardenia Phlegm, NP on 05/27/2018   05/25/2018 Oncotype testing   Oncotype DX score 11: 3% risk of distant recurrence of 9 years, low risk   06/24/2018 - 08/03/2018 Radiation Therapy   Adjuvant radiation therapy   09/2018 -  Anti-estrogen oral therapy   Tamoxifen daily   GIST (gastrointestinal stromal tumor) of small bowel, malignant ([place])  04/03/2021 Cancer Staging   Staging form: Gastrointestinal Stromal Tumor - Small Intestinal, Esophageal, Colorectal, Mesenteric, and Peritoneal GIST, AJCC 8th Edition - Pathologic stage from 04/03/2021: Stage IIIB (pT4, pN0, cM0, Mitotic Rate: High) - Signed by Truitt Merle, MD on 04/23/2021 Stage prefix: Initial diagnosis Histologic grade (G): High grade Histologic grading system: 2 grade system Residual tumor (R): R0 - None   04/23/2021 Initial Diagnosis   GIST (gastrointestinal stromal tumor) of small bowel, malignant ([place])   05/15/2021 Imaging   CT Chest w/o contrast  IMPRESSION: 1. 6 mm nodular opacity in the posterior right lower lobe with a similar size focus of architectural distortion/scarring in this region on the remote study from 17 years ago, suggesting that this is simply scar although the finding is more confluent on today's study. While almost assuredly benign, consider follow-up CT chest in 6 months to ensure stability. 2. No evidence for metastatic disease in the chest.       INTERVAL HISTORY:  Anne Horton is here for a follow up of GIST. She was last seen by me on 04/24/21 in consultation. She presents to the clinic alone. She reports she is doing well, and she is back to work full time.   All other systems were reviewed with the patient and are negative.  MEDICAL HISTORY:  Past Medical History:  Diagnosis Date   Allergy    Anemia    Anxiety    Breast cancer ([place]) 05/07/2018   left invasive ductal    Cancer (HCC)    Chest pain    Depression    Family history of breast cancer    Fibromyalgia    neurofibromatosis   H/O: depression    Hx of migraines    Mitral regurgitation    mild per echo EF 55-60%   Neurofibromatosis ([place])    Palpitations    Personal history of radiation therapy 08/10/2018   left breast 06/24/2018-08/10/2018  Dr [name]   Tachycardia    Hx. of   Tachycardia    Wears contact lenses     SURGICAL HISTORY: Past Surgical History:  Procedure Laterality Date   BOWEL RESECTION N/A 04/03/2021   Procedure: SMALL BOWEL RESECTION;  Surgeon: [name], MD;  Location: [place];  Service: General;  Laterality: N/A;   BREAST BIOPSY     BREAST LUMPECTOMY Left 05/07/2018   Invasive ductal    BREAST LUMPECTOMY WITH  RADIOACTIVE SEED AND SENTINEL LYMPH NODE BIOPSY Left 05/07/2018   Procedure: BREAST LUMPECTOMY WITH RADIOACTIVE SEED AND SENTINEL LYMPH NODE BIOPSY;  Surgeon: Rolm Bookbinder, MD;  Location: [place];  Service: General;  Laterality: Left;   CESAREAN SECTION     CHOLECYSTECTOMY N/A 01/04/2015   Procedure: LAPAROSCOPIC CHOLECYSTECTOMY WITH INTRAOPERATIVE CHOLANGIOGRAM;  Surgeon: Erroll Luna, MD;  Location: [place];  Service: General;  Laterality: N/A;   COLONOSCOPY     ESOPHAGOGASTRODUODENOSCOPY     LAPAROTOMY N/A 04/03/2021   Procedure: EXPLORATORY LAPAROTOMY WITH RESECTION OF ABDOMINAL MASS;  Surgeon: [name], MD;  Location: [place];  Service: General;  Laterality: N/A;    MASTOPEXY Bilateral 09/05/2020   Procedure: BILATERAL BREAST MASTOPEXY;  Surgeon: Irene Limbo, MD;  Location: [place];  Service: Plastics;  Laterality: Bilateral;   OMENTECTOMY N/A 04/03/2021   Procedure: OMENTECTOMY;  Surgeon: [name], MD;  Location: [place];  Service: General;  Laterality: N/A;   WISDOM TOOTH EXTRACTION      I have reviewed the social history and family history with the patient and they are unchanged from previous note.  ALLERGIES:  is allergic to latex.  MEDICATIONS:  Current Outpatient Medications  Medication Sig Dispense Refill   ALPRAZolam (XANAX) 0.5 MG tablet Take 1/2 to 1 tablet by mouth 2 times a day as needed (Patient taking differently: Take 0.25-0.5 mg by mouth 2 (two) times daily as needed for anxiety.) 180 tablet 1   cetirizine (ZYRTEC) 10 MG tablet Take 10 mg by mouth daily.     gabapentin (NEURONTIN) 100 MG capsule Take 1 capsule (100 mg total) by mouth at bedtime. (Patient taking differently: Take 100 mg by mouth at bedtime as needed (pain).) 30 capsule 3   imatinib (GLEEVEC) 400 MG tablet Take 1 tablet (400 mg total) by mouth daily. Take with meals and large glass of water.Caution:Chemotherapy. 30 tablet 3   metoprolol succinate (TOPROL-XL) 50 MG 24 hr tablet TAKE 1 TABLET BY MOUTH DAILY. TAKE WITH OR IMMEDIATELY FOLLOWING A MEAL. (Patient taking differently: Take 50 mg by mouth daily.) 90 tablet 3   Multiple Vitamin (MULTIVITAMIN) tablet Take 1 tablet by mouth daily.     propranolol (INDERAL) 10 MG tablet TAKE 1 TABLET BY MOUTH FOUR TIMES DAILY AS NEEDED FOR PALPITATIONS (Patient taking differently: Take 10 mg by mouth 4 (four) times daily as needed (palpitations).) 120 tablet 3   senna-docusate (SENOKOT-S) 8.6-50 MG tablet Take 1 tablet by mouth daily as needed for mild constipation.     tamoxifen (NOLVADEX) 20 MG tablet TAKE 1 TABLET BY MOUTH ONCE A DAY (Patient not taking: No sig reported) 90 tablet 3   venlafaxine XR  (EFFEXOR-XR) 75 MG 24 hr capsule Take 1 capsule (75 mg total) by mouth daily. 90 capsule 1   No current facility-administered medications for this visit.    PHYSICAL EXAMINATION: ECOG PERFORMANCE STATUS: 1 - Symptomatic but completely ambulatory  Vitals:   05/29/21 0900  BP: (!) 133/47  Pulse: 73  Resp: 18  Temp: 97.8 F (36.6 C)  SpO2: 100%   Wt Readings from Last 3 Encounters:  05/29/21 133 lb 6.4 oz (60.5 kg)  05/14/21 130 lb 6 oz (59.1 kg)  04/24/21 127 lb 3.2 oz (57.7 kg)     GENERAL:alert, no distress and comfortable SKIN: skin color, texture, turgor are normal, no rashes or significant lesions EYES: normal, Conjunctiva are pink and non-injected, sclera clear  NECK: supple, thyroid normal size, non-tender, without  nodularity LYMPH:  no palpable lymphadenopathy in the cervical, axillary  LUNGS: clear to auscultation and percussion with normal breathing effort HEART: regular rate & rhythm and no murmurs and no lower extremity edema ABDOMEN:abdomen soft, non-tender and normal bowel sounds Musculoskeletal:no cyanosis of digits and no clubbing  NEURO: alert & oriented x 3 with fluent speech, no focal motor/sensory deficits  LABORATORY DATA:  I have reviewed the data as listed CBC Latest Ref Rng & Units 05/29/2021 04/24/2021 04/08/2021  WBC 4.0 - 10.5 K/uL 5.1 8.0 8.8  Hemoglobin 12.0 - 15.0 g/dL 10.9(L) 10.0(L) 8.8(L)  Hematocrit 36.0 - 46.0 % 35.5(L) 32.1(L) 28.4(L)  Platelets 150 - 400 K/uL 325 499(H) 687(H)     CMP Latest Ref Rng & Units 05/29/2021 04/24/2021 04/08/2021  Glucose 70 - 99 mg/dL 87 101(H) 104(H)  BUN 6 - 20 mg/dL 8 10 <5(L)  Creatinine 0.44 - 1.00 mg/dL 0.74 0.65 0.65  Sodium 135 - 145 mmol/L 142 142 140  Potassium 3.5 - 5.1 mmol/L 3.7 3.7 4.2  Chloride 98 - 111 mmol/L 110 109 110  CO2 22 - 32 mmol/L _0 Calcium 8.9 - 10.3 mg/dL 8.6(L) 9.2 8.5(L)  Total Protein 6.5 - 8.1 g/dL 6.1(L) 7.0 -  Total Bilirubin 0.3 - 1.2 mg/dL 0.3 0.3 -  Alkaline Phos  38 - 126 U/L 46 40 -  AST 15 - 41 U/L 19 15 -  ALT 0 - 44 U/L 18 11 -      RADIOGRAPHIC STUDIES: I have personally reviewed the radiological images as listed and agreed with the findings in the report. No results found.    No orders of the defined types were placed in this encounter.  All questions were answered. The patient knows to call the clinic with any problems, questions or concerns. No barriers to learning was detected.      Truitt Merle, MD 05/29/2021   I, Wilburn Mylar, am acting as scribe for Truitt Merle, MD.   I have reviewed the above documentation for accuracy and completeness, and I agree with the above.

## 2021-05-30 ENCOUNTER — Encounter: Payer: Self-pay | Admitting: Hematology

## 2021-06-21 ENCOUNTER — Ambulatory Visit: Payer: Self-pay | Admitting: Genetic Counselor

## 2021-06-21 ENCOUNTER — Telehealth: Payer: Self-pay | Admitting: Genetic Counselor

## 2021-06-21 DIAGNOSIS — Z1379 Encounter for other screening for genetic and chromosomal anomalies: Secondary | ICD-10-CM

## 2021-06-21 DIAGNOSIS — C49A3 Gastrointestinal stromal tumor of small intestine: Secondary | ICD-10-CM

## 2021-06-21 DIAGNOSIS — C50412 Malignant neoplasm of upper-outer quadrant of left female breast: Secondary | ICD-10-CM

## 2021-06-21 DIAGNOSIS — Z170 Estrogen receptor positive status [ER+]: Secondary | ICD-10-CM

## 2021-06-21 DIAGNOSIS — Q8500 Neurofibromatosis, unspecified: Secondary | ICD-10-CM

## 2021-06-21 NOTE — Telephone Encounter (Signed)
Revealed that the VUS classification of her NF1 variant has been upgraded to pathogenic.  This confirms that this is the cause of her diagnosis of NF1.

## 2021-06-21 NOTE — Progress Notes (Signed)
GENETIC TEST RESULTS   Patient Name: Anne Horton [facility] Patient Age: 45 y.o. Encounter Date: 06/21/2021  Referring Provider: Truitt Merle, MD    Anne Horton was seen in the [place] clinic on March 08, 2021 due to a personal and family history of Neurofibromatosis and a personal history of breast cancer and GIST tumors.  She was concerned whether she needed additional genetic testing regarding a hereditary predisposition to cancer in the family. At that time, we did not feel that she needed additional testing, but based on her personal and family history of Neurofibromatosis, Type I, and her personal history of two NF1 cancers, we felt that further delineation of the NF1 VUS may be warranted.  Please refer to the prior Genetics clinic note for more information regarding Anne Horton's medical and family histories and our assessment at the time.   FAMILY HISTORY:  We obtained a detailed, 4-generation family history.  Significant diagnoses are listed below: Family History  Problem Relation Age of Onset   Breast cancer Mother 80       estrogen negative, recurred in 2012   Diabetes Mother    Neurofibromatosis Father    Atrial fibrillation Maternal Grandfather    Cancer Paternal Grandmother        type unk, died at 31   Neurofibromatosis Paternal Grandmother    Stroke Paternal Grandfather    Heart disease Paternal Uncle    Diabetes Paternal Uncle    Heart attack Neg Hx    Hypertension Neg Hx     Anne Horton has a 66 year-old daughter.  Anne Horton is an only child.    Anne Horton's father: 63, has dx of NF1, no history of cancer.  Paternal aunts/Uncles: 1 paternal uncle died due to heart disease/ diabetes. Paternal cousins: 2 paternal cousins, 1 is deceased, no history of cancer.  Paternal grandfather: deceased due to stroke Paternal grandmother:died at 43 die to metastatic cancer, unk primary.  She also had a hx of NF1   Anne Horton's mother: dx with breast cancer at 13.  In 2012 breast  cancer came back in same breast and same location.  Her breast cancer was estrogen negative.  She is unsure if she ever had genetic testing.  She also had tongue cancer and a hysterectomy at the age of 92.  Maternal Aunts/Uncles: 2 maternal aunts and 1 maternal uncle in their 19's/60's with no history of cancer.  Maternal cousins: no history of cancer.  Maternal grandfather: died >50 due to infection.  He had a brother who died of cancer.  Maternal grandmother:died at 44 due to a motor vehicle accident.  No reported hx of cancer on her side of the family .   Patient's maternal ancestors are of Scottish/Irish/Native American descent, and paternal ancestors are of German/Polish descent. There is no reported Ashkenazi Jewish ancestry. There is no known consanguinity.  GENETIC TESTING: At the time of Anne Horton's visit, we recommended she pursue RNA genetic testing for the specific NF1 VUS that was identified in the family. The genetic testing was reported out on June 21, 2021 through the NF1 single site test offered by [facility] identified a single, heterozygous pathogenic gene mutation called NF1, c.2325G>A (Silent).     CLINICAL CONDITION: Neurofibromatosis type 1 is a condition that affects many systems in the body and is associated with an increased risk to develop several types of cancers and tumors. Individuals may have patches of coffee-colored skin (caf-au-lait spots), freckles in the underarms and groin  areas, harmless bumps in the eyes (Lisch nodules) and noncancerous tumors located on or just under the skin (neurofibromas).   There is also an increased chance for certain cancers including a type of eye cancer (optic nerve glioma), cancer of the tissues that cover the nerves (malignant peripheral nerve sheath tumors or MPNST), brain tumors, breast cancer, digestive tract tumors (gastrointestinal stromal tumors or GIST) and adrenal gland tumors (pheochromocytoma or [facility]). They may also have  conditions affecting the heart and blood vessels.   Variations of this condition include neurofibromatosis-Noonan syndrome (NFNS) and Watson syndrome, which have similar features as neurofibromatosis type 1 as well as short stature, heart defects, and other symptoms. Variants in NF1 can be mosaic, meaning they are present in some of an individual's cells but not others. Individuals with mosaic NF1 may have more mild symptoms, or even features of the condition that are limited to certain areas of the body. Sometimes an NF1 variant is acquired later in life within the blood, which is known as clonal hematopoiesis. The likelihood of clonal hematopoiesis increases with an individual's age, exposure to chemotherapy or radiation treatment. Management will differ depending on whether the variant was present at birth or acquired in the blood later on.  INHERITANCE: Hereditary predisposition to cancer due to pathogenic variants in the NF1 gene has autosomal dominant inheritance. This means that an individual with a pathogenic variant has a 50% chance of passing the condition on to his/her offspring. Most cases are inherited from a parent, but some cases may occur spontaneously (i.e., an individual with a pathogenic variant has parents who do not have it). Identification of a pathogenic variant allows for the recognition of at-risk relatives who can pursue testing for the familial variant.  MANAGEMENT Management Guidelines for individuals with pathogenic NF1 variants have been developed in combination from specialty expert opinion and NCCN (R) :    An individual's cancer risk and medical management are not determined by genetic test results alone. Overall cancer risk assessment incorporates additional factors including personal medical history, family history, as well as available genetic information that may result in a personalized plan for cancer prevention and surveillance.  FAMILY MEMBERS: It is important  that all of Anne Horton's relatives (both men and women) know of the presence of this gene mutation. Site-specific genetic testing can sort out who in the family is at risk and who is not. It is advantageous to know if a NF1 pathogenic variant is present as medical management recommendations can be implemented. At-risk relatives can be identified, allowing pursuit of a diagnostic evaluation. In addition, the available information regarding hereditary cancer susceptibility genes is constantly evolving and more clinically relevant NF1 data is likely to become available in the near future. Awareness of this cancer predisposition allows patients and their providers to be vigilant in maintaining close and regular contact with their local genetics clinic in anticipation of new information, inform at-risk family members, and diligently follow condition-specific screening protocols.  Ms. Sobieski daughter has a 50% chance to have inherited this mutation. We recommend she have genetic testing for this same mutation, as identifying the presence of this mutation would allow them to also take advantage of risk-reducing measures.   SUPPORT AND RESOURCES: If Ms. Mccollum is interested in NF1-specific information and support, there are is a group, Neurofibromatosis Network (www.nfnetwork.org) which some people have found useful. They provide opportunities to speak with other individuals from affected families. To locate genetic counselors in other cities, visit the website of  the [employer] (ArtistMovie.se) and search for a counselor by zip code.  We encouraged Ms. Olmsted to remain in contact with [place] on an annual basis so we can update her personal and family histories, and let her know of advances in cancer genetics that may benefit the family. Our contact number was provided. Ms. Jupiter's questions were answered to her satisfaction today, and she knows she is welcome to call anytime with additional  questions.   Safari Cinque P. Florene Glen, [place], [facility] Licensed, Insurance risk surveyor [email or web address] phone: [phone]

## 2021-06-25 ENCOUNTER — Other Ambulatory Visit (HOSPITAL_COMMUNITY): Payer: Self-pay

## 2021-07-03 ENCOUNTER — Other Ambulatory Visit (HOSPITAL_COMMUNITY): Payer: Self-pay

## 2021-07-06 ENCOUNTER — Other Ambulatory Visit (HOSPITAL_COMMUNITY): Payer: Self-pay

## 2021-07-06 MED FILL — Tamoxifen Citrate Tab 20 MG (Base Equivalent): ORAL | 90 days supply | Qty: 90 | Fill #2 | Status: AC

## 2021-07-26 ENCOUNTER — Other Ambulatory Visit (HOSPITAL_COMMUNITY): Payer: Self-pay

## 2021-07-27 ENCOUNTER — Inpatient Hospital Stay: Payer: 59 | Attending: Radiation Oncology | Admitting: Hematology

## 2021-07-27 ENCOUNTER — Encounter: Payer: Self-pay | Admitting: Hematology

## 2021-07-27 ENCOUNTER — Inpatient Hospital Stay: Payer: 59

## 2021-07-27 ENCOUNTER — Other Ambulatory Visit: Payer: Self-pay

## 2021-07-27 VITALS — BP 138/50 | HR 89 | Temp 98.4°F | Resp 17 | Ht 62.0 in | Wt 133.8 lb

## 2021-07-27 DIAGNOSIS — Z923 Personal history of irradiation: Secondary | ICD-10-CM | POA: Insufficient documentation

## 2021-07-27 DIAGNOSIS — Q8500 Neurofibromatosis, unspecified: Secondary | ICD-10-CM | POA: Diagnosis not present

## 2021-07-27 DIAGNOSIS — D509 Iron deficiency anemia, unspecified: Secondary | ICD-10-CM | POA: Insufficient documentation

## 2021-07-27 DIAGNOSIS — D500 Iron deficiency anemia secondary to blood loss (chronic): Secondary | ICD-10-CM

## 2021-07-27 DIAGNOSIS — C50412 Malignant neoplasm of upper-outer quadrant of left female breast: Secondary | ICD-10-CM | POA: Diagnosis not present

## 2021-07-27 DIAGNOSIS — Z9049 Acquired absence of other specified parts of digestive tract: Secondary | ICD-10-CM | POA: Insufficient documentation

## 2021-07-27 DIAGNOSIS — C49A3 Gastrointestinal stromal tumor of small intestine: Secondary | ICD-10-CM | POA: Diagnosis not present

## 2021-07-27 DIAGNOSIS — R110 Nausea: Secondary | ICD-10-CM | POA: Insufficient documentation

## 2021-07-27 DIAGNOSIS — Z79899 Other long term (current) drug therapy: Secondary | ICD-10-CM | POA: Insufficient documentation

## 2021-07-27 DIAGNOSIS — R5383 Other fatigue: Secondary | ICD-10-CM | POA: Insufficient documentation

## 2021-07-27 DIAGNOSIS — Z170 Estrogen receptor positive status [ER+]: Secondary | ICD-10-CM | POA: Diagnosis not present

## 2021-07-27 LAB — CMP (CANCER CENTER ONLY)
ALT: 18 U/L (ref 0–44)
AST: 17 U/L (ref 15–41)
Albumin: 3.9 g/dL (ref 3.5–5.0)
Alkaline Phosphatase: 46 U/L (ref 38–126)
Anion gap: 10 (ref 5–15)
BUN: 10 mg/dL (ref 6–20)
CO2: 21 mmol/L — ABNORMAL LOW (ref 22–32)
Calcium: 8.3 mg/dL — ABNORMAL LOW (ref 8.9–10.3)
Chloride: 108 mmol/L (ref 98–111)
Creatinine: 0.74 mg/dL (ref 0.44–1.00)
GFR, Estimated: >60 mL/min (ref >60)
Glucose, Bld: 89 mg/dL (ref 70–99)
Potassium: 3.9 mmol/L (ref 3.5–5.1)
Sodium: 139 mmol/L (ref 135–145)
Total Bilirubin: 0.5 mg/dL (ref 0.3–1.2)
Total Protein: 6.5 g/dL (ref 6.5–8.1)

## 2021-07-27 LAB — CBC WITH DIFFERENTIAL (CANCER CENTER ONLY)
Abs Immature Granulocytes: 0.04 10*3/uL (ref 0.00–0.07)
Basophils Absolute: 0.1 10*3/uL (ref 0.0–0.1)
Basophils Relative: 1 %
Eosinophils Absolute: 0.2 10*3/uL (ref 0.0–0.5)
Eosinophils Relative: 3 %
HCT: 34.6 % — ABNORMAL LOW (ref 36.0–46.0)
Hemoglobin: 11.4 g/dL — ABNORMAL LOW (ref 12.0–15.0)
Immature Granulocytes: 1 %
Lymphocytes Relative: 24 %
Lymphs Abs: 1.7 10*3/uL (ref 0.7–4.0)
MCH: 27.3 pg (ref 26.0–34.0)
MCHC: 32.9 g/dL (ref 30.0–36.0)
MCV: 82.8 fL (ref 80.0–100.0)
Monocytes Absolute: 0.6 10*3/uL (ref 0.1–1.0)
Monocytes Relative: 8 %
Neutro Abs: 4.5 10*3/uL (ref 1.7–7.7)
Neutrophils Relative %: 63 %
Platelet Count: 356 10*3/uL (ref 150–400)
RBC: 4.18 MIL/uL (ref 3.87–5.11)
RDW: 22.8 % — ABNORMAL HIGH (ref 11.5–15.5)
WBC Count: 7.1 10*3/uL (ref 4.0–10.5)
nRBC: 0 % (ref 0.0–0.2)

## 2021-07-27 LAB — IRON AND TIBC
Iron: 127 ug/dL (ref 41–142)
Saturation Ratios: 43 % (ref 21–57)
TIBC: 298 ug/dL (ref 236–444)
UIBC: 170 ug/dL (ref 120–384)

## 2021-07-27 LAB — FERRITIN: Ferritin: 77 ng/mL (ref 11–307)

## 2021-07-27 NOTE — Progress Notes (Signed)
[place]   [phone] [phone]   Clinic Follow up Note   Patient Care Team: Daphene Calamity as PCP - General (Family Medicine) [name], Wonda Cheng, MD as PCP - Cardiology (Cardiology) [name], MD as Consulting Physician (Radiation Oncology) Nicholas Lose, MD as Consulting Physician (Hematology and Oncology) Rolm Bookbinder, MD as Consulting Physician (General Surgery)  Date of Service:  07/27/2021  CHIEF COMPLAINT: f/u of GIST of small bowel  CURRENT THERAPY:  Imatinib, 400 mg daily, starting 05/07/21  ASSESSMENT & PLAN:  Anne Horton is a 45 y.o. female with   1. Multifocal GIST of small bowel, pT4N0Mo, stage IIIB, high grade, mitotic rate 9/HPF -initially presented to ED on 02/17/21 with worsening abdominal pain and nausea. CT showed 15.7 cm mass in left mid to lower abdomen, no metastatic disease. MRI showed separation of the mass and the left ovary, and GIST felt to be likely.  -resection on 02/26/21 under Dr. Barry Dienes showed multiple tumor in small bowel, with three small lesions which were unable to be resected. Path confirmed GIST, high grade, margins and lymph nodes were negative. -staging chest CT 05/15/21 was negative. -she began imatinib 400 mg daily on 05/07/21. She is tolerating well without issue. -will repeat CT scan next month, and continue surveillance CT every 4 months due to high risk of recurrence and known residual disease  -f/u in 2 months    2. NF-1 -We discussed that neurofibromatosis is high risk for GIST  -genetic testing 05/15/21 confirmed pathogenic variant in NF1.   3. Iron deficient anemia -Likely secondary to tumor bleeding from GIST -She previously received IV iron venofer and had vaso-vagal like reaction at the end of second dose -improved to 11.4 today (07/27/21), iron study pending -She had infusion reaction to benefit from last infusion, I will switch her to [facility] which she tolerated  before    4.  Malignant neoplasm of upper-outer quadrant of left breast, ER+/PR+/HER2-, stage I (pT1cN0) -Oncotype DX score 11 -Status post lumpectomy in August 2019 and adjuvant radiation -She is on adjuvant tamoxifen, tolerating well. Plan for 10 years  -most recent mammogram 03/21/21 was negative.     Plan -continue imatinib 400 mg daily -Lab and follow-up in 2 months -CT AP w contrast scan in next 3-4 weeks  -iv ferahenme if needed    No problem-specific Assessment & Plan notes found for this encounter.   SUMMARY OF ONCOLOGIC HISTORY: Oncology History Overview Note  Cancer Staging GIST (gastrointestinal stromal tumor) of small bowel, malignant (HCC) Staging form: Gastrointestinal Stromal Tumor - Small Intestinal, Esophageal, Colorectal, Mesenteric, and Peritoneal GIST, AJCC 8th Edition - Pathologic stage from 04/03/2021: Stage IIIB (pT4, pN0, cM0, Mitotic Rate: High) - Signed by Truitt Merle, MD on 04/23/2021  Malignant neoplasm of upper-outer quadrant of left breast in female, estrogen receptor positive ([place]) Staging form: Breast, AJCC 8th Edition - Clinical stage from 03/20/2018: Stage IA (cT1c, cN0, cM0, G2, ER+, PR+, HER2-) - Signed by Gardenia Phlegm, NP on 05/27/2018 - Pathologic stage from 05/07/2018: Stage IA (pT1c, pN0, cM0, G1, ER+, PR+, HER2-, Oncotype DX score: 11) - Signed by Gardenia Phlegm, NP on 05/27/2018    Malignant neoplasm of upper-outer quadrant of left breast in female, estrogen receptor positive ([place])  03/20/2018 Initial Diagnosis   Screening detected left breast spiculated mass by ultrasound measured 1.2 cm.  Biopsy revealed invasive ductal carcinoma grade 1-2 with high-grade DCIS, ER 95%, PR 95%, Ki-67 1%, HER-2 negative  ratio 1.47, T1CN0 stage I a clinical stage   03/20/2018 Cancer Staging   Staging form: Breast, AJCC 8th Edition - Clinical stage from 03/20/2018: Stage IA (cT1c, cN0, cM0, G2, ER+, PR+, HER2-) - Signed by Gardenia Phlegm, NP  on 05/27/2018    04/08/2018 Genetic Testing   NF1  c.2325G>A (Silent) pathogenic variant was identified.  This variant was reclassified from a VUS to pathogenic on June 20, 2021.  The Common Hereditary Cancer Panel offered by [facility] includes sequencing and/or deletion duplication testing of the following 47 genes: APC, ATM, AXIN2, BARD1, BMPR1A, BRCA1, BRCA2, BRIP1, CDH1, CDKN2A (p14ARF), CDKN2A (p16INK4a), CKD4, CHEK2, CTNNA1, DICER1, EPCAM (Deletion/duplication testing only), GREM1 (promoter region deletion/duplication testing only), KIT, MEN1, MLH1, MSH2, MSH3, MSH6, MUTYH, NBN, NF1, NHTL1, PALB2, PDGFRA, PMS2, POLD1, POLE, PTEN, RAD50, RAD51C, RAD51D, SDHB, SDHC, SDHD, SMAD4, SMARCA4. STK11, TP53, TSC1, TSC2, and VHL.  The following genes were evaluated for sequence changes only: SDHA and HOXB13 c.251G>A variant only.  Results: No pathogenic variants identified.  A Variant of uncertain significance in the gene NF1 was detected c.2325G>A (Silent). The date of this test report is 04/03/2018.    05/07/2018 Surgery   Left lumpectomy: IDC grade 1, 1.2 cm, intermediate grade DCIS, margins negative,PASH, 0/7 sentinel lymph nodes negative, ER 95%, PR 95%, Ki-67 1%, HER-2 negative ratio 1.47, T1CN0 stage Ia   05/07/2018 Cancer Staging   Staging form: Breast, AJCC 8th Edition - Pathologic stage from 05/07/2018: Stage IA (pT1c, pN0, cM0, G1, ER+, PR+, HER2-, Oncotype DX score: 11) - Signed by Gardenia Phlegm, NP on 05/27/2018    05/25/2018 Oncotype testing   Oncotype DX score 11: 3% risk of distant recurrence of 9 years, low risk   06/24/2018 - 08/03/2018 Radiation Therapy   Adjuvant radiation therapy   09/2018 -  Anti-estrogen oral therapy   Tamoxifen daily   GIST (gastrointestinal stromal tumor) of small bowel, malignant ([place])  04/03/2021 Cancer Staging   Staging form: Gastrointestinal Stromal Tumor - Small Intestinal, Esophageal, Colorectal, Mesenteric, and Peritoneal GIST, AJCC 8th Edition -  Pathologic stage from 04/03/2021: Stage IIIB (pT4, pN0, cM0, Mitotic Rate: High) - Signed by Truitt Merle, MD on 04/23/2021 Stage prefix: Initial diagnosis Histologic grade (G): High grade Histologic grading system: 2 grade system Residual tumor (R): R0 - None    04/23/2021 Initial Diagnosis   GIST (gastrointestinal stromal tumor) of small bowel, malignant ([place])   05/15/2021 Imaging   CT Chest w/o contrast  IMPRESSION: 1. 6 mm nodular opacity in the posterior right lower lobe with a similar size focus of architectural distortion/scarring in this region on the remote study from 17 years ago, suggesting that this is simply scar although the finding is more confluent on today's study. While almost assuredly benign, consider follow-up CT chest in 6 months to ensure stability. 2. No evidence for metastatic disease in the chest.      INTERVAL HISTORY:  Anne Horton is here for a follow up of GIST. She was last seen by me on 05/29/21. She presents to the clinic alone. She is overall doing well, mild fatigue lately, but she is able to function fully.  She has occasional nausea, no vomiting or diarrhea.  She reports intermittent which last a few minutes, and resolve spontaneously.  No vision change or other neurological symptoms.  She is tolerating Gleevec well, no other complains.   All other systems were reviewed with the patient and are negative.  MEDICAL HISTORY:  Past Medical History:  Diagnosis Date   Allergy    Anemia    Anxiety    Breast cancer ([place]) 05/07/2018   left invasive ductal    Cancer (HCC)    Chest pain    Depression    Family history of breast cancer    Fibromyalgia    neurofibromatosis   H/O: depression    Hx of migraines    Mitral regurgitation    mild per echo EF 55-60%   Neurofibromatosis ([place])    Palpitations    Personal history of radiation therapy 08/10/2018   left breast 06/24/2018-08/10/2018  Dr [name]   Tachycardia    Hx. of   Tachycardia     Wears contact lenses     SURGICAL HISTORY: Past Surgical History:  Procedure Laterality Date   BOWEL RESECTION N/A 04/03/2021   Procedure: SMALL BOWEL RESECTION;  Surgeon: [name], MD;  Location: [place];  Service: General;  Laterality: N/A;   BREAST BIOPSY     BREAST LUMPECTOMY Left 05/07/2018   Invasive ductal    BREAST LUMPECTOMY WITH RADIOACTIVE SEED AND SENTINEL LYMPH NODE BIOPSY Left 05/07/2018   Procedure: BREAST LUMPECTOMY WITH RADIOACTIVE SEED AND SENTINEL LYMPH NODE BIOPSY;  Surgeon: Rolm Bookbinder, MD;  Location: [place];  Service: General;  Laterality: Left;   CESAREAN SECTION     CHOLECYSTECTOMY N/A 01/04/2015   Procedure: LAPAROSCOPIC CHOLECYSTECTOMY WITH INTRAOPERATIVE CHOLANGIOGRAM;  Surgeon: Erroll Luna, MD;  Location: [place];  Service: General;  Laterality: N/A;   COLONOSCOPY     ESOPHAGOGASTRODUODENOSCOPY     LAPAROTOMY N/A 04/03/2021   Procedure: EXPLORATORY LAPAROTOMY WITH RESECTION OF ABDOMINAL MASS;  Surgeon: [name], MD;  Location: [place];  Service: General;  Laterality: N/A;   MASTOPEXY Bilateral 09/05/2020   Procedure: BILATERAL BREAST MASTOPEXY;  Surgeon: Irene Limbo, MD;  Location: [place];  Service: Plastics;  Laterality: Bilateral;   OMENTECTOMY N/A 04/03/2021   Procedure: OMENTECTOMY;  Surgeon: [name], MD;  Location: [place];  Service: General;  Laterality: N/A;   WISDOM TOOTH EXTRACTION      I have reviewed the social history and family history with the patient and they are unchanged from previous note.  ALLERGIES:  is allergic to latex.  MEDICATIONS:  Current Outpatient Medications  Medication Sig Dispense Refill   ALPRAZolam (XANAX) 0.5 MG tablet Take 1/2 to 1 tablet by mouth 2 times a day as needed (Patient taking differently: Take 0.25-0.5 mg by mouth 2 (two) times daily as needed for anxiety.) 180 tablet 1   cetirizine (ZYRTEC) 10 MG tablet Take 10 mg by mouth daily.      gabapentin (NEURONTIN) 100 MG capsule Take 1 capsule (100 mg total) by mouth at bedtime. (Patient taking differently: Take 100 mg by mouth at bedtime as needed (pain).) 30 capsule 3   imatinib (GLEEVEC) 400 MG tablet Take 1 tablet (400 mg total) by mouth daily. Take with meals and large glass of water.Caution:Chemotherapy. 30 tablet 3   metoprolol succinate (TOPROL-XL) 50 MG 24 hr tablet TAKE 1 TABLET BY MOUTH DAILY. TAKE WITH OR IMMEDIATELY FOLLOWING A MEAL. (Patient taking differently: Take 50 mg by mouth daily.) 90 tablet 3   Multiple Vitamin (MULTIVITAMIN) tablet Take 1 tablet by mouth daily.     propranolol (INDERAL) 10 MG tablet TAKE 1 TABLET BY MOUTH FOUR TIMES DAILY AS NEEDED FOR PALPITATIONS (Patient taking differently: Take 10 mg by mouth 4 (four) times daily as needed (palpitations).) 120 tablet 3  senna-docusate (SENOKOT-S) 8.6-50 MG tablet Take 1 tablet by mouth daily as needed for mild constipation.     tamoxifen (NOLVADEX) 20 MG tablet TAKE 1 TABLET BY MOUTH ONCE A DAY (Patient not taking: No sig reported) 90 tablet 3   venlafaxine XR (EFFEXOR-XR) 75 MG 24 hr capsule Take 1 capsule (75 mg total) by mouth daily. 90 capsule 1   No current facility-administered medications for this visit.    PHYSICAL EXAMINATION: ECOG PERFORMANCE STATUS: 1 - Symptomatic but completely ambulatory  Vitals:   07/27/21 0843  BP: (!) 138/50  Pulse: 89  Resp: 17  Temp: 98.4 F (36.9 C)  SpO2: 98%   Wt Readings from Last 3 Encounters:  07/27/21 133 lb 12.8 oz (60.7 kg)  05/29/21 133 lb 6.4 oz (60.5 kg)  05/14/21 130 lb 6 oz (59.1 kg)     GENERAL:alert, no distress and comfortable SKIN: skin color, texture, turgor are normal, no rashes or significant lesions EYES: normal, Conjunctiva are pink and non-injected, sclera clear NECK: supple, thyroid normal size, non-tender, without nodularity LYMPH:  no palpable lymphadenopathy in the cervical, axillary  LUNGS: clear to auscultation and  percussion with normal breathing effort HEART: regular rate & rhythm and no murmurs and no lower extremity edema ABDOMEN:abdomen soft, non-tender and normal bowel sounds Musculoskeletal:no cyanosis of digits and no clubbing  NEURO: alert & oriented x 3 with fluent speech, no focal motor/sensory deficits  LABORATORY DATA:  I have reviewed the data as listed CBC Latest Ref Rng & Units 07/27/2021 05/29/2021 04/24/2021  WBC 4.0 - 10.5 K/uL 7.1 5.1 8.0  Hemoglobin 12.0 - 15.0 g/dL 11.4(L) 10.9(L) 10.0(L)  Hematocrit 36.0 - 46.0 % 34.6(L) 35.5(L) 32.1(L)  Platelets 150 - 400 K/uL 356 325 499(H)     CMP Latest Ref Rng & Units 07/27/2021 05/29/2021 04/24/2021  Glucose 70 - 99 mg/dL 89 87 101(H)  BUN 6 - 20 mg/dL _0 Creatinine 0.44 - 1.00 mg/dL 0.74 0.74 0.65  Sodium 135 - 145 mmol/L 139 142 142  Potassium 3.5 - 5.1 mmol/L 3.9 3.7 3.7  Chloride 98 - 111 mmol/L 108 110 109  CO2 22 - 32 mmol/L 21(L) 23 24  Calcium 8.9 - 10.3 mg/dL 8.3(L) 8.6(L) 9.2  Total Protein 6.5 - 8.1 g/dL 6.5 6.1(L) 7.0  Total Bilirubin 0.3 - 1.2 mg/dL 0.5 0.3 0.3  Alkaline Phos 38 - 126 U/L 46 46 40  AST 15 - 41 U/L _1 ALT 0 - 44 U/L _2 RADIOGRAPHIC STUDIES: I have personally reviewed the radiological images as listed and agreed with the findings in the report. No results found.    Orders Placed This Encounter  Procedures   CT ABDOMEN PELVIS W CONTRAST    Standing Status:   Future    Standing Expiration Date:   07/27/2022    Order Specific Question:   If indicated for the ordered procedure, I authorize the administration of contrast media per Radiology protocol    Answer:   Yes    Order Specific Question:   Preferred imaging location?    Answer:   [facility]    Order Specific Question:   Release to patient    Answer:   Manual release only    Order Specific Question:   Reason for preventing immediate release    Answer:   Patient request [1]    Order Specific Question:    Additional details for preventing immediate  release    Answer:   no    Order Specific Question:   Is Oral Contrast requested for this exam?    Answer:   Yes, Per Radiology protocol    Order Specific Question:   Is patient pregnant?    Answer:   No   All questions were answered. The patient knows to call the clinic with any problems, questions or concerns. No barriers to learning was detected. The total time spent in the appointment was 30 minutes.     Truitt Merle, MD 07/27/2021   I, Wilburn Mylar, am acting as scribe for Truitt Merle, MD.   I have reviewed the above documentation for accuracy and completeness, and I agree with the above.

## 2021-07-29 ENCOUNTER — Encounter: Payer: Self-pay | Admitting: Hematology

## 2021-07-30 ENCOUNTER — Other Ambulatory Visit (HOSPITAL_COMMUNITY): Payer: Self-pay

## 2021-07-30 ENCOUNTER — Telehealth: Payer: Self-pay | Admitting: Hematology

## 2021-07-30 NOTE — Telephone Encounter (Signed)
Left message with follow-up appointment per 11/11 los.

## 2021-07-31 ENCOUNTER — Other Ambulatory Visit (HOSPITAL_COMMUNITY): Payer: Self-pay

## 2021-07-31 DIAGNOSIS — F411 Generalized anxiety disorder: Secondary | ICD-10-CM | POA: Diagnosis not present

## 2021-07-31 DIAGNOSIS — Z79899 Other long term (current) drug therapy: Secondary | ICD-10-CM | POA: Diagnosis not present

## 2021-07-31 DIAGNOSIS — F330 Major depressive disorder, recurrent, mild: Secondary | ICD-10-CM | POA: Diagnosis not present

## 2021-07-31 LAB — METHYLMALONIC ACID, SERUM: Methylmalonic Acid, Quantitative: 202 nmol/L (ref 0–378)

## 2021-07-31 MED ORDER — ALPRAZOLAM 0.5 MG PO TABS
ORAL_TABLET | ORAL | 1 refills | Status: DC
  Filled 2021-07-31: qty 180, 90d supply, fill #0

## 2021-07-31 MED ORDER — VENLAFAXINE HCL ER 75 MG PO CP24
ORAL_CAPSULE | ORAL | 1 refills | Status: DC
  Filled 2021-07-31 – 2021-09-28 (×2): qty 90, 90d supply, fill #0
  Filled 2022-01-09: qty 90, 90d supply, fill #1

## 2021-08-02 ENCOUNTER — Other Ambulatory Visit (HOSPITAL_COMMUNITY): Payer: Self-pay

## 2021-08-06 ENCOUNTER — Other Ambulatory Visit (HOSPITAL_COMMUNITY): Payer: Self-pay

## 2021-08-08 ENCOUNTER — Other Ambulatory Visit (HOSPITAL_COMMUNITY): Payer: Self-pay

## 2021-08-21 ENCOUNTER — Encounter: Payer: Self-pay | Admitting: Hematology

## 2021-08-21 ENCOUNTER — Ambulatory Visit (HOSPITAL_COMMUNITY)
Admission: RE | Admit: 2021-08-21 | Discharge: 2021-08-21 | Disposition: A | Discharge status: Home / Self Care | Payer: 59 | Source: Ambulatory Visit | Attending: Hematology | Admitting: Hematology

## 2021-08-21 ENCOUNTER — Other Ambulatory Visit: Payer: Self-pay

## 2021-08-21 DIAGNOSIS — C49A3 Gastrointestinal stromal tumor of small intestine: Secondary | ICD-10-CM | POA: Diagnosis not present

## 2021-08-21 DIAGNOSIS — Z9049 Acquired absence of other specified parts of digestive tract: Secondary | ICD-10-CM | POA: Diagnosis not present

## 2021-08-21 DIAGNOSIS — D259 Leiomyoma of uterus, unspecified: Secondary | ICD-10-CM | POA: Diagnosis not present

## 2021-08-21 DIAGNOSIS — R1900 Intra-abdominal and pelvic swelling, mass and lump, unspecified site: Secondary | ICD-10-CM | POA: Diagnosis not present

## 2021-08-21 MED ORDER — SODIUM CHLORIDE (PF) 0.9 % IJ SOLN
INTRAMUSCULAR | Status: AC
  Filled 2021-08-21: qty 50

## 2021-08-21 MED ORDER — IOHEXOL 350 MG/ML SOLN
75.0000 mL | Freq: Once | INTRAVENOUS | Status: AC | PRN
  Administered 2021-08-21: 75 mL via INTRAVENOUS

## 2021-08-22 ENCOUNTER — Other Ambulatory Visit: Payer: Self-pay | Admitting: Physician Assistant

## 2021-08-22 ENCOUNTER — Other Ambulatory Visit: Payer: Self-pay | Admitting: Hematology

## 2021-08-22 ENCOUNTER — Telehealth: Payer: Self-pay | Admitting: Hematology

## 2021-08-22 ENCOUNTER — Other Ambulatory Visit (HOSPITAL_COMMUNITY): Payer: Self-pay

## 2021-08-22 DIAGNOSIS — C49A3 Gastrointestinal stromal tumor of small intestine: Secondary | ICD-10-CM

## 2021-08-22 MED ORDER — METOPROLOL SUCCINATE ER 50 MG PO TB24
50.0000 mg | ORAL_TABLET | Freq: Every day | ORAL | 0 refills | Status: DC
Start: 2021-08-22 — End: 2021-09-28
  Filled 2021-08-22: qty 30, 30d supply, fill #0

## 2021-08-22 NOTE — Telephone Encounter (Signed)
I spoke with Anne Horton about her CT scan findings, which was negative for NET recurrence, but it showed a new small lesion in pancrease head, MRI is recommended for further evaluation. She is agreeable. I ordered and will get it done in next few weeks.  Truitt Merle  08/22/2021

## 2021-08-23 ENCOUNTER — Other Ambulatory Visit: Payer: Self-pay

## 2021-08-23 ENCOUNTER — Other Ambulatory Visit: Payer: Self-pay | Admitting: Hematology

## 2021-08-23 ENCOUNTER — Ambulatory Visit (HOSPITAL_COMMUNITY)
Admission: RE | Admit: 2021-08-23 | Discharge: 2021-08-23 | Disposition: A | Discharge status: Home / Self Care | Payer: 59 | Source: Ambulatory Visit | Attending: Hematology | Admitting: Hematology

## 2021-08-23 ENCOUNTER — Encounter: Payer: Self-pay | Admitting: Hematology

## 2021-08-23 DIAGNOSIS — C49A3 Gastrointestinal stromal tumor of small intestine: Secondary | ICD-10-CM | POA: Insufficient documentation

## 2021-08-23 DIAGNOSIS — I700 Atherosclerosis of aorta: Secondary | ICD-10-CM | POA: Diagnosis not present

## 2021-08-23 DIAGNOSIS — Z9049 Acquired absence of other specified parts of digestive tract: Secondary | ICD-10-CM | POA: Diagnosis not present

## 2021-08-23 MED ORDER — GADOBUTROL 1 MMOL/ML IV SOLN
6.0000 mL | Freq: Once | INTRAVENOUS | Status: AC | PRN
  Administered 2021-08-23: 6 mL via INTRAVENOUS

## 2021-08-24 ENCOUNTER — Other Ambulatory Visit (HOSPITAL_COMMUNITY): Payer: Self-pay

## 2021-08-30 ENCOUNTER — Other Ambulatory Visit: Payer: Self-pay | Admitting: Hematology

## 2021-08-30 ENCOUNTER — Other Ambulatory Visit (HOSPITAL_COMMUNITY): Payer: Self-pay

## 2021-08-30 MED ORDER — IMATINIB MESYLATE 400 MG PO TABS
400.0000 mg | ORAL_TABLET | Freq: Every day | ORAL | 3 refills | Status: DC
  Filled 2021-08-30: qty 30, 30d supply, fill #0
  Filled 2021-09-28: qty 30, 30d supply, fill #1
  Filled 2021-11-13: qty 30, 30d supply, fill #2
  Filled 2021-12-12: qty 30, 30d supply, fill #3

## 2021-08-31 ENCOUNTER — Other Ambulatory Visit (HOSPITAL_COMMUNITY): Payer: Self-pay

## 2021-09-04 ENCOUNTER — Other Ambulatory Visit (HOSPITAL_COMMUNITY): Payer: Self-pay

## 2021-09-06 ENCOUNTER — Other Ambulatory Visit (HOSPITAL_COMMUNITY): Payer: Self-pay

## 2021-09-13 DIAGNOSIS — C49A4 Gastrointestinal stromal tumor of large intestine: Secondary | ICD-10-CM | POA: Diagnosis not present

## 2021-09-13 DIAGNOSIS — Z01411 Encounter for gynecological examination (general) (routine) with abnormal findings: Secondary | ICD-10-CM | POA: Diagnosis not present

## 2021-09-13 DIAGNOSIS — N926 Irregular menstruation, unspecified: Secondary | ICD-10-CM | POA: Diagnosis not present

## 2021-09-13 DIAGNOSIS — Z01419 Encounter for gynecological examination (general) (routine) without abnormal findings: Secondary | ICD-10-CM | POA: Diagnosis not present

## 2021-09-13 DIAGNOSIS — Z1151 Encounter for screening for human papillomavirus (HPV): Secondary | ICD-10-CM | POA: Diagnosis not present

## 2021-09-13 DIAGNOSIS — Z853 Personal history of malignant neoplasm of breast: Secondary | ICD-10-CM | POA: Diagnosis not present

## 2021-09-13 DIAGNOSIS — Z79810 Long term (current) use of selective estrogen receptor modulators (SERMs): Secondary | ICD-10-CM | POA: Diagnosis not present

## 2021-09-25 ENCOUNTER — Other Ambulatory Visit: Payer: Self-pay

## 2021-09-25 DIAGNOSIS — D508 Other iron deficiency anemias: Secondary | ICD-10-CM

## 2021-09-26 ENCOUNTER — Inpatient Hospital Stay: Payer: 59

## 2021-09-26 ENCOUNTER — Encounter: Payer: Self-pay | Admitting: Hematology

## 2021-09-26 ENCOUNTER — Other Ambulatory Visit: Payer: Self-pay

## 2021-09-26 ENCOUNTER — Inpatient Hospital Stay: Payer: 59 | Attending: Radiation Oncology | Admitting: Hematology

## 2021-09-26 ENCOUNTER — Encounter: Payer: Self-pay | Admitting: Cardiovascular Disease

## 2021-09-26 VITALS — BP 118/48 | HR 83 | Temp 98.5°F | Resp 17 | Ht 62.0 in | Wt 132.5 lb

## 2021-09-26 DIAGNOSIS — D500 Iron deficiency anemia secondary to blood loss (chronic): Secondary | ICD-10-CM

## 2021-09-26 DIAGNOSIS — Z803 Family history of malignant neoplasm of breast: Secondary | ICD-10-CM | POA: Insufficient documentation

## 2021-09-26 DIAGNOSIS — C49A3 Gastrointestinal stromal tumor of small intestine: Secondary | ICD-10-CM

## 2021-09-26 DIAGNOSIS — R197 Diarrhea, unspecified: Secondary | ICD-10-CM | POA: Insufficient documentation

## 2021-09-26 DIAGNOSIS — Z170 Estrogen receptor positive status [ER+]: Secondary | ICD-10-CM | POA: Diagnosis not present

## 2021-09-26 DIAGNOSIS — Z9049 Acquired absence of other specified parts of digestive tract: Secondary | ICD-10-CM | POA: Insufficient documentation

## 2021-09-26 DIAGNOSIS — R110 Nausea: Secondary | ICD-10-CM | POA: Insufficient documentation

## 2021-09-26 DIAGNOSIS — C50412 Malignant neoplasm of upper-outer quadrant of left female breast: Secondary | ICD-10-CM | POA: Insufficient documentation

## 2021-09-26 DIAGNOSIS — Z923 Personal history of irradiation: Secondary | ICD-10-CM | POA: Insufficient documentation

## 2021-09-26 DIAGNOSIS — Q8500 Neurofibromatosis, unspecified: Secondary | ICD-10-CM | POA: Insufficient documentation

## 2021-09-26 DIAGNOSIS — D259 Leiomyoma of uterus, unspecified: Secondary | ICD-10-CM | POA: Diagnosis not present

## 2021-09-26 DIAGNOSIS — Z79899 Other long term (current) drug therapy: Secondary | ICD-10-CM | POA: Diagnosis not present

## 2021-09-26 DIAGNOSIS — I700 Atherosclerosis of aorta: Secondary | ICD-10-CM | POA: Insufficient documentation

## 2021-09-26 DIAGNOSIS — D509 Iron deficiency anemia, unspecified: Secondary | ICD-10-CM | POA: Insufficient documentation

## 2021-09-26 DIAGNOSIS — D508 Other iron deficiency anemias: Secondary | ICD-10-CM

## 2021-09-26 LAB — CBC WITH DIFFERENTIAL (CANCER CENTER ONLY)
Abs Immature Granulocytes: 0.03 10*3/uL (ref 0.00–0.07)
Basophils Absolute: 0.1 10*3/uL (ref 0.0–0.1)
Basophils Relative: 1 %
Eosinophils Absolute: 0.3 10*3/uL (ref 0.0–0.5)
Eosinophils Relative: 4 %
HCT: 35.8 % — ABNORMAL LOW (ref 36.0–46.0)
Hemoglobin: 12.3 g/dL (ref 12.0–15.0)
Immature Granulocytes: 0 %
Lymphocytes Relative: 22 %
Lymphs Abs: 1.6 10*3/uL (ref 0.7–4.0)
MCH: 31.6 pg (ref 26.0–34.0)
MCHC: 34.4 g/dL (ref 30.0–36.0)
MCV: 92 fL (ref 80.0–100.0)
Monocytes Absolute: 0.7 10*3/uL (ref 0.1–1.0)
Monocytes Relative: 9 %
Neutro Abs: 4.6 10*3/uL (ref 1.7–7.7)
Neutrophils Relative %: 64 %
Platelet Count: 381 10*3/uL (ref 150–400)
RBC: 3.89 MIL/uL (ref 3.87–5.11)
RDW: 12.7 % (ref 11.5–15.5)
WBC Count: 7.2 10*3/uL (ref 4.0–10.5)
nRBC: 0 % (ref 0.0–0.2)

## 2021-09-26 LAB — CMP (CANCER CENTER ONLY)
ALT: 20 U/L (ref 0–44)
AST: 19 U/L (ref 15–41)
Albumin: 4.1 g/dL (ref 3.5–5.0)
Alkaline Phosphatase: 49 U/L (ref 38–126)
Anion gap: 7 (ref 5–15)
BUN: 13 mg/dL (ref 6–20)
CO2: 24 mmol/L (ref 22–32)
Calcium: 8.6 mg/dL — ABNORMAL LOW (ref 8.9–10.3)
Chloride: 108 mmol/L (ref 98–111)
Creatinine: 0.73 mg/dL (ref 0.44–1.00)
GFR, Estimated: >60 mL/min (ref >60)
Glucose, Bld: 103 mg/dL — ABNORMAL HIGH (ref 70–99)
Potassium: 4.1 mmol/L (ref 3.5–5.1)
Sodium: 139 mmol/L (ref 135–145)
Total Bilirubin: 0.4 mg/dL (ref 0.3–1.2)
Total Protein: 6.7 g/dL (ref 6.5–8.1)

## 2021-09-26 LAB — IRON AND IRON BINDING CAPACITY (CC-WL,HP ONLY)
Iron: 76 ug/dL (ref 28–170)
Saturation Ratios: 21 % (ref 10.4–31.8)
TIBC: 358 ug/dL (ref 250–450)
UIBC: 282 ug/dL (ref 148–442)

## 2021-09-26 LAB — FERRITIN: Ferritin: 60 ng/mL (ref 11–307)

## 2021-09-26 NOTE — Progress Notes (Signed)
[place]   [phone] [phone]   Clinic Follow up Note   Patient Care Team: Daphene Calamity as PCP - General (Family Medicine) [name], Wonda Cheng, MD as PCP - Cardiology (Cardiology) [name], MD as Consulting Physician (Radiation Oncology) Nicholas Lose, MD as Consulting Physician (Hematology and Oncology) Rolm Bookbinder, MD as Consulting Physician (General Surgery)  Date of Service:  09/26/2021  CHIEF COMPLAINT: f/u of GIST of small bowel  CURRENT THERAPY:  Imatinib, 400 mg daily, starting 05/07/21  ASSESSMENT & PLAN:  Anne Horton is a 46 y.o. female with   1. Multifocal GIST of small bowel, pT4N0Mo, stage IIIB, high grade, mitotic rate 9/HPF -initially presented to ED on 02/17/21 with worsening abdominal pain and nausea. CT showed 15.7 cm mass in left mid to lower abdomen, no metastatic disease. MRI showed separation of the mass and the left ovary, and GIST felt to be likely.  -resection on 02/26/21 under Dr. Barry Dienes showed multiple tumor in small bowel, with three small lesions which were unable to be resected. Path confirmed GIST, high grade, margins and lymph nodes were negative. -staging chest CT 05/15/21 was negative. -she began imatinib 400 mg daily on 05/07/21. She is tolerating well without issue. -CT AP on 08/21/21 and MRI abdomen on 08/23/21 showed NED. -she is clinically doing well. Labs reviewed, no concern -continue surveillance CT every 6 months due to high risk of recurrence and known residual disease  -f/u in 2.5 months    2. NF-1 -We discussed that neurofibromatosis is high risk for GIST  -genetic testing 05/15/21 confirmed pathogenic variant in NF1.   3. Iron deficient anemia -Likely secondary to tumor bleeding from GIST -She previously received IV iron venofer and had vaso-vagal like reaction at the end of second dose -hgb WNL at 12.3 today (09/26/21), iron study pending -She had infusion reaction to  venofer with last infusion, I will switch her to [facility] which she tolerated before    4.  Malignant neoplasm of upper-outer quadrant of left breast, ER+/PR+/HER2-, stage I (pT1cN0) -Oncotype DX score 11 -Status post lumpectomy in August 2019 and adjuvant radiation -She is on adjuvant tamoxifen, tolerating well. Plan for 10 years  -most recent mammogram 03/21/21 was negative. -[place] in 08/2021 showed she is still premenopausal      Plan -continue imatinib 400 mg daily -follow-up in 2.5 months, will order  CT CAP on next visit    No problem-specific Assessment & Plan notes found for this encounter.   SUMMARY OF ONCOLOGIC HISTORY: Oncology History Overview Note  Cancer Staging GIST (gastrointestinal stromal tumor) of small bowel, malignant (HCC) Staging form: Gastrointestinal Stromal Tumor - Small Intestinal, Esophageal, Colorectal, Mesenteric, and Peritoneal GIST, AJCC 8th Edition - Pathologic stage from 04/03/2021: Stage IIIB (pT4, pN0, cM0, Mitotic Rate: High) - Signed by Truitt Merle, MD on 04/23/2021  Malignant neoplasm of upper-outer quadrant of left breast in female, estrogen receptor positive ([place]) Staging form: Breast, AJCC 8th Edition - Clinical stage from 03/20/2018: Stage IA (cT1c, cN0, cM0, G2, ER+, PR+, HER2-) - Signed by Gardenia Phlegm, NP on 05/27/2018 - Pathologic stage from 05/07/2018: Stage IA (pT1c, pN0, cM0, G1, ER+, PR+, HER2-, Oncotype DX score: 11) - Signed by Gardenia Phlegm, NP on 05/27/2018    Malignant neoplasm of upper-outer quadrant of left breast in female, estrogen receptor positive ([place])  03/20/2018 Initial Diagnosis   Screening detected left breast spiculated mass by ultrasound measured 1.2 cm.  Biopsy revealed invasive  ductal carcinoma grade 1-2 with high-grade DCIS, ER 95%, PR 95%, Ki-67 1%, HER-2 negative ratio 1.47, T1CN0 stage I a clinical stage   03/20/2018 Cancer Staging   Staging form: Breast, AJCC 8th Edition - Clinical stage from  03/20/2018: Stage IA (cT1c, cN0, cM0, G2, ER+, PR+, HER2-) - Signed by Gardenia Phlegm, NP on 05/27/2018    04/08/2018 Genetic Testing   NF1  c.2325G>A (Silent) pathogenic variant was identified.  This variant was reclassified from a VUS to pathogenic on June 20, 2021.  The Common Hereditary Cancer Panel offered by [facility] includes sequencing and/or deletion duplication testing of the following 47 genes: APC, ATM, AXIN2, BARD1, BMPR1A, BRCA1, BRCA2, BRIP1, CDH1, CDKN2A (p14ARF), CDKN2A (p16INK4a), CKD4, CHEK2, CTNNA1, DICER1, EPCAM (Deletion/duplication testing only), GREM1 (promoter region deletion/duplication testing only), KIT, MEN1, MLH1, MSH2, MSH3, MSH6, MUTYH, NBN, NF1, NHTL1, PALB2, PDGFRA, PMS2, POLD1, POLE, PTEN, RAD50, RAD51C, RAD51D, SDHB, SDHC, SDHD, SMAD4, SMARCA4. STK11, TP53, TSC1, TSC2, and VHL.  The following genes were evaluated for sequence changes only: SDHA and HOXB13 c.251G>A variant only.  Results: No pathogenic variants identified.  A Variant of uncertain significance in the gene NF1 was detected c.2325G>A (Silent). The date of this test report is 04/03/2018.    05/07/2018 Surgery   Left lumpectomy: IDC grade 1, 1.2 cm, intermediate grade DCIS, margins negative,PASH, 0/7 sentinel lymph nodes negative, ER 95%, PR 95%, Ki-67 1%, HER-2 negative ratio 1.47, T1CN0 stage Ia   05/07/2018 Cancer Staging   Staging form: Breast, AJCC 8th Edition - Pathologic stage from 05/07/2018: Stage IA (pT1c, pN0, cM0, G1, ER+, PR+, HER2-, Oncotype DX score: 11) - Signed by Gardenia Phlegm, NP on 05/27/2018    05/25/2018 Oncotype testing   Oncotype DX score 11: 3% risk of distant recurrence of 9 years, low risk   06/24/2018 - 08/03/2018 Radiation Therapy   Adjuvant radiation therapy   09/2018 -  Anti-estrogen oral therapy   Tamoxifen daily   GIST (gastrointestinal stromal tumor) of small bowel, malignant ([place])  04/03/2021 Cancer Staging   Staging form: Gastrointestinal Stromal  Tumor - Small Intestinal, Esophageal, Colorectal, Mesenteric, and Peritoneal GIST, AJCC 8th Edition - Pathologic stage from 04/03/2021: Stage IIIB (pT4, pN0, cM0, Mitotic Rate: High) - Signed by Truitt Merle, MD on 04/23/2021 Stage prefix: Initial diagnosis Histologic grade (G): High grade Histologic grading system: 2 grade system Residual tumor (R): R0 - None    04/23/2021 Initial Diagnosis   GIST (gastrointestinal stromal tumor) of small bowel, malignant ([place])   05/15/2021 Imaging   CT Chest w/o contrast  IMPRESSION: 1. 6 mm nodular opacity in the posterior right lower lobe with a similar size focus of architectural distortion/scarring in this region on the remote study from 17 years ago, suggesting that this is simply scar although the finding is more confluent on today's study. While almost assuredly benign, consider follow-up CT chest in 6 months to ensure stability. 2. No evidence for metastatic disease in the chest.   08/21/2021 Imaging   EXAM: CT ABDOMEN AND PELVIS WITH CONTRAST  IMPRESSION: Interval surgical resection of large left abdominal cystic mass since prior exam. No evidence of tumor recurrence or metastatic disease within the abdomen or pelvis.   8 mm low-attenuation lesion in pancreatic head, not definitely seen on prior exams. Consider further characterization with MRI, or continued surveillance on follow-up CT.   Small posterior uterine fibroid.   08/23/2021 Imaging   EXAM: MRI ABDOMEN WITHOUT AND WITH CONTRAST  IMPRESSION: 1. No evidence of pancreatic  mass or suspicious correlate for the CT abnormality, which may have been artifactual or possibly related to heterogeneous fatty replacement. 2. No acute process or evidence of metastatic disease in the abdomen. 3.  Aortic Atherosclerosis (ICD10-I70.0).      INTERVAL HISTORY:  Anne Horton is here for a follow up of GIST. She was last seen by me on 07/27/21. She presents to the clinic alone. She  reports nausea in the mornings a couple days a week; she endorses taking the Gleevec at night. She also reports occasional diarrhea. She denies bloody or black stool. She notes she has not had a period in over a year, but [facility] lab work with her GYN showed she is still premenopausal. She notes she is going to be scheduled for [place].   All other systems were reviewed with the patient and are negative.  MEDICAL HISTORY:  Past Medical History:  Diagnosis Date   Allergy    Anemia    Anxiety    Breast cancer ([place]) 05/07/2018   left invasive ductal    Cancer (HCC)    Chest pain    Depression    Family history of breast cancer    Fibromyalgia    neurofibromatosis   H/O: depression    Hx of migraines    Mitral regurgitation    mild per echo EF 55-60%   Neurofibromatosis ([place])    Palpitations    Personal history of radiation therapy 08/10/2018   left breast 06/24/2018-08/10/2018  Dr [name]   Tachycardia    Hx. of   Tachycardia    Wears contact lenses     SURGICAL HISTORY: Past Surgical History:  Procedure Laterality Date   BOWEL RESECTION N/A 04/03/2021   Procedure: SMALL BOWEL RESECTION;  Surgeon: [name], MD;  Location: [facility];  Service: General;  Laterality: N/A;   BREAST BIOPSY     BREAST LUMPECTOMY Left 05/07/2018   Invasive ductal    BREAST LUMPECTOMY WITH RADIOACTIVE SEED AND SENTINEL LYMPH NODE BIOPSY Left 05/07/2018   Procedure: BREAST LUMPECTOMY WITH RADIOACTIVE SEED AND SENTINEL LYMPH NODE BIOPSY;  Surgeon: Rolm Bookbinder, MD;  Location: [facility];  Service: General;  Laterality: Left;   CESAREAN SECTION     CHOLECYSTECTOMY N/A 01/04/2015   Procedure: LAPAROSCOPIC CHOLECYSTECTOMY WITH INTRAOPERATIVE CHOLANGIOGRAM;  Surgeon: Erroll Luna, MD;  Location: [facility];  Service: General;  Laterality: N/A;   COLONOSCOPY     ESOPHAGOGASTRODUODENOSCOPY     LAPAROTOMY N/A 04/03/2021   Procedure: EXPLORATORY LAPAROTOMY WITH RESECTION OF  ABDOMINAL MASS;  Surgeon: [name], MD;  Location: [facility];  Service: General;  Laterality: N/A;   MASTOPEXY Bilateral 09/05/2020   Procedure: BILATERAL BREAST MASTOPEXY;  Surgeon: Irene Limbo, MD;  Location: [facility];  Service: Plastics;  Laterality: Bilateral;   OMENTECTOMY N/A 04/03/2021   Procedure: OMENTECTOMY;  Surgeon: [name], MD;  Location: [facility];  Service: General;  Laterality: N/A;   WISDOM TOOTH EXTRACTION      I have reviewed the social history and family history with the patient and they are unchanged from previous note.  ALLERGIES:  is allergic to latex.  MEDICATIONS:  Current Outpatient Medications  Medication Sig Dispense Refill   ALPRAZolam (XANAX) 0.5 MG tablet Take 1/2 to 1 tablet by mouth 2 times a day as needed (Patient taking differently: Take 0.25-0.5 mg by mouth 2 (two) times daily as needed for anxiety.) 180 tablet 1   ALPRAZolam (XANAX) 0.5 MG tablet Take  1/2 to 1 tab 2 times a day as needed 180 tablet 1   cetirizine (ZYRTEC) 10 MG tablet Take 10 mg by mouth daily.     gabapentin (NEURONTIN) 100 MG capsule Take 1 capsule (100 mg total) by mouth at bedtime. (Patient taking differently: Take 100 mg by mouth at bedtime as needed (pain).) 30 capsule 3   imatinib (GLEEVEC) 400 MG tablet Take 1 tablet (400 mg total) by mouth daily. Take with meals and large glass of water.Caution:Chemotherapy. 30 tablet 3   metoprolol succinate (TOPROL-XL) 50 MG 24 hr tablet Take 1 tablet by mouth daily. With or immediately following a meal. Please make overdue appointment with Dr. Acie Fredrickson before anymore refills. Thank you 1st attempt 30 tablet 0   Multiple Vitamin (MULTIVITAMIN) tablet Take 1 tablet by mouth daily.     propranolol (INDERAL) 10 MG tablet TAKE 1 TABLET BY MOUTH FOUR TIMES DAILY AS NEEDED FOR PALPITATIONS (Patient taking differently: Take 10 mg by mouth 4 (four) times daily as needed (palpitations).) 120 tablet 3   senna-docusate (SENOKOT-S)  8.6-50 MG tablet Take 1 tablet by mouth daily as needed for mild constipation.     tamoxifen (NOLVADEX) 20 MG tablet TAKE 1 TABLET BY MOUTH ONCE A DAY (Patient not taking: No sig reported) 90 tablet 3   venlafaxine XR (EFFEXOR-XR) 75 MG 24 hr capsule Take 1 capsule by mouth once a day. 90 capsule 1   No current facility-administered medications for this visit.    PHYSICAL EXAMINATION: ECOG PERFORMANCE STATUS: 0 - Asymptomatic  Vitals:   09/26/21 0843  BP: (!) 118/48  Pulse: 83  Resp: 17  Temp: 98.5 F (36.9 C)  SpO2: 100%   Wt Readings from Last 3 Encounters:  09/26/21 132 lb 8.6 oz (60.1 kg)  07/27/21 133 lb 12.8 oz (60.7 kg)  05/29/21 133 lb 6.4 oz (60.5 kg)     GENERAL:alert, no distress and comfortable SKIN: skin color, texture, turgor are normal, no rashes or significant lesions EYES: normal, Conjunctiva are pink and non-injected, sclera clear  NECK: supple, thyroid normal size, non-tender, without nodularity LYMPH:  no palpable lymphadenopathy in the cervical, axillary  LUNGS: clear to auscultation and percussion with normal breathing effort HEART: regular rate & rhythm and no murmurs and no lower extremity edema ABDOMEN:abdomen soft, non-tender and normal bowel sounds Musculoskeletal:no cyanosis of digits and no clubbing  NEURO: alert & oriented x 3 with fluent speech, no focal motor/sensory deficits  LABORATORY DATA:  I have reviewed the data as listed CBC Latest Ref Rng & Units 09/26/2021 07/27/2021 05/29/2021  WBC 4.0 - 10.5 K/uL 7.2 7.1 5.1  Hemoglobin 12.0 - 15.0 g/dL 12.3 11.4(L) 10.9(L)  Hematocrit 36.0 - 46.0 % 35.8(L) 34.6(L) 35.5(L)  Platelets 150 - 400 K/uL 381 356 325     CMP Latest Ref Rng & Units 09/26/2021 07/27/2021 05/29/2021  Glucose 70 - 99 mg/dL 103(H) 89 87  BUN 6 - 20 mg/dL '13 10 8  ' Creatinine 0.44 - 1.00 mg/dL 0.73 0.74 0.74  Sodium 135 - 145 mmol/L 139 139 142  Potassium 3.5 - 5.1 mmol/L 4.1 3.9 3.7  Chloride 98 - 111 mmol/L 108 108 110   CO2 22 - 32 mmol/L 24 21(L) 23  Calcium 8.9 - 10.3 mg/dL 8.6(L) 8.3(L) 8.6(L)  Total Protein 6.5 - 8.1 g/dL 6.7 6.5 6.1(L)  Total Bilirubin 0.3 - 1.2 mg/dL 0.4 0.5 0.3  Alkaline Phos 38 - 126 U/L 49 46 46  AST 15 - 41 U/L 19  17 19  ALT 0 - 44 U/L '20 18 18      ' RADIOGRAPHIC STUDIES: I have personally reviewed the radiological images as listed and agreed with the findings in the report. No results found.    No orders of the defined types were placed in this encounter.  All questions were answered. The patient knows to call the clinic with any problems, questions or concerns. No barriers to learning was detected. The total time spent in the appointment was 30 minutes.     Truitt Merle, MD 09/26/2021   I, Wilburn Mylar, am acting as scribe for Truitt Merle, MD.   I have reviewed the above documentation for accuracy and completeness, and I agree with the above.
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
## 2021-09-28 ENCOUNTER — Other Ambulatory Visit: Payer: Self-pay | Admitting: Hematology and Oncology

## 2021-09-28 ENCOUNTER — Other Ambulatory Visit: Payer: Self-pay

## 2021-09-28 ENCOUNTER — Encounter: Payer: Self-pay | Admitting: Cardiovascular Disease

## 2021-09-28 ENCOUNTER — Ambulatory Visit: Payer: 59 | Admitting: Cardiovascular Disease

## 2021-09-28 ENCOUNTER — Other Ambulatory Visit (HOSPITAL_COMMUNITY): Payer: Self-pay

## 2021-09-28 VITALS — BP 118/78 | HR 90 | Ht 62.0 in | Wt 132.0 lb

## 2021-09-28 DIAGNOSIS — R000 Tachycardia, unspecified: Secondary | ICD-10-CM

## 2021-09-28 MED ORDER — PROPRANOLOL HCL 10 MG PO TABS
ORAL_TABLET | ORAL | 3 refills | Status: AC
  Filled 2021-09-28: qty 120, 30d supply, fill #0

## 2021-09-28 MED ORDER — TAMOXIFEN CITRATE 20 MG PO TABS
ORAL_TABLET | Freq: Every day | ORAL | 3 refills | Status: DC
  Filled 2021-09-28: qty 90, 90d supply, fill #0
  Filled 2022-01-09: qty 90, 90d supply, fill #1
  Filled 2022-04-15: qty 90, 90d supply, fill #2
  Filled 2022-07-03: qty 90, 90d supply, fill #3

## 2021-09-28 MED ORDER — METOPROLOL SUCCINATE ER 50 MG PO TB24
50.0000 mg | ORAL_TABLET | Freq: Every day | ORAL | 0 refills | Status: DC
  Filled 2021-09-28: qty 30, 30d supply, fill #0

## 2021-09-28 NOTE — Patient Instructions (Signed)
Medication Instructions:  Your physician recommends that you continue on your current medications as directed. Please refer to the Current Medication list given to you today.  *If you need a refill on your cardiac medications before your next appointment, please call your pharmacy*  Lab Work: If you have labs (blood work) drawn today and your tests are completely normal, you will receive your results only by: [place] (if you have MyChart) OR A paper copy in the mail If you have any lab test that is abnormal or we need to change your treatment, we will call you to review the results.  Testing/Procedures: None ordered today.  Follow-Up: At [facility], you and your health needs are our priority.  As part of our continuing mission to provide you with exceptional heart care, we have created designated Provider Care Teams.  These Care Teams include your primary Cardiologist (physician) and Advanced Practice Providers (APPs -  Physician Assistants and Nurse Practitioners) who all work together to provide you with the care you need, when you need it.  We recommend signing up for the patient portal called "MyChart".  Sign up information is provided on this After Visit Summary.  MyChart is used to connect with patients for Virtual Visits (Telemedicine).  Patients are able to view lab/test results, encounter notes, upcoming appointments, etc.  Non-urgent messages can be sent to your provider as well.   To learn more about what you can do with MyChart, go to [email or web address].    Your next appointment:   1 year(s)  The format for your next appointment:   In Person  Provider:   Mertie Moores, MD {

## 2021-10-02 ENCOUNTER — Other Ambulatory Visit (HOSPITAL_COMMUNITY): Payer: Self-pay

## 2021-10-04 ENCOUNTER — Other Ambulatory Visit (HOSPITAL_COMMUNITY): Payer: Self-pay

## 2021-10-17 ENCOUNTER — Encounter: Payer: Self-pay | Admitting: Hematology

## 2021-10-19 ENCOUNTER — Other Ambulatory Visit: Payer: Self-pay

## 2021-10-19 MED ORDER — ONDANSETRON HCL 8 MG PO TABS
8.0000 mg | ORAL_TABLET | Freq: Three times a day (TID) | ORAL | 0 refills | Status: DC | PRN
Start: 2021-10-19 — End: 2021-10-26

## 2021-10-22 ENCOUNTER — Other Ambulatory Visit (HOSPITAL_COMMUNITY): Payer: Self-pay

## 2021-10-22 DIAGNOSIS — N926 Irregular menstruation, unspecified: Secondary | ICD-10-CM | POA: Diagnosis not present

## 2021-10-22 DIAGNOSIS — B3731 Acute candidiasis of vulva and vagina: Secondary | ICD-10-CM | POA: Diagnosis not present

## 2021-10-22 DIAGNOSIS — N898 Other specified noninflammatory disorders of vagina: Secondary | ICD-10-CM | POA: Diagnosis not present

## 2021-10-22 MED ORDER — FLUCONAZOLE 200 MG PO TABS
ORAL_TABLET | ORAL | 0 refills | Status: DC
  Filled 2021-10-22: qty 3, 7d supply, fill #0

## 2021-10-24 ENCOUNTER — Other Ambulatory Visit (HOSPITAL_COMMUNITY): Payer: Self-pay

## 2021-10-25 ENCOUNTER — Other Ambulatory Visit: Payer: Self-pay | Admitting: Cardiovascular Disease

## 2021-10-25 ENCOUNTER — Other Ambulatory Visit (HOSPITAL_COMMUNITY): Payer: Self-pay

## 2021-10-25 MED ORDER — METOPROLOL SUCCINATE ER 50 MG PO TB24
50.0000 mg | ORAL_TABLET | Freq: Every day | ORAL | 3 refills | Status: DC
  Filled 2021-10-25: qty 90, 90d supply, fill #0
  Filled 2022-01-09: qty 90, 90d supply, fill #1
  Filled 2022-04-15: qty 90, 90d supply, fill #2
  Filled 2022-07-03: qty 90, 90d supply, fill #3

## 2021-10-26 ENCOUNTER — Other Ambulatory Visit: Payer: Self-pay | Admitting: *Deleted

## 2021-10-26 ENCOUNTER — Other Ambulatory Visit: Payer: Self-pay

## 2021-10-26 ENCOUNTER — Other Ambulatory Visit (HOSPITAL_COMMUNITY): Payer: Self-pay

## 2021-10-26 MED ORDER — ONDANSETRON HCL 8 MG PO TABS
8.0000 mg | ORAL_TABLET | Freq: Three times a day (TID) | ORAL | 0 refills | Status: DC | PRN
  Filled 2021-10-26: qty 20, 7d supply, fill #0

## 2021-10-29 ENCOUNTER — Other Ambulatory Visit (HOSPITAL_COMMUNITY): Payer: Self-pay

## 2021-11-08 ENCOUNTER — Other Ambulatory Visit (HOSPITAL_COMMUNITY): Payer: Self-pay

## 2021-11-12 ENCOUNTER — Other Ambulatory Visit (HOSPITAL_COMMUNITY): Payer: Self-pay

## 2021-11-13 ENCOUNTER — Other Ambulatory Visit (HOSPITAL_COMMUNITY): Payer: Self-pay

## 2021-11-14 DIAGNOSIS — N926 Irregular menstruation, unspecified: Secondary | ICD-10-CM | POA: Diagnosis not present

## 2021-11-16 ENCOUNTER — Other Ambulatory Visit (HOSPITAL_COMMUNITY): Payer: Self-pay

## 2021-11-21 DIAGNOSIS — Z85038 Personal history of other malignant neoplasm of large intestine: Secondary | ICD-10-CM | POA: Diagnosis not present

## 2021-11-21 DIAGNOSIS — Z853 Personal history of malignant neoplasm of breast: Secondary | ICD-10-CM | POA: Diagnosis not present

## 2021-11-21 DIAGNOSIS — Z79899 Other long term (current) drug therapy: Secondary | ICD-10-CM | POA: Diagnosis not present

## 2021-11-21 DIAGNOSIS — N939 Abnormal uterine and vaginal bleeding, unspecified: Secondary | ICD-10-CM | POA: Diagnosis not present

## 2021-11-21 DIAGNOSIS — N912 Amenorrhea, unspecified: Secondary | ICD-10-CM | POA: Diagnosis not present

## 2021-11-21 DIAGNOSIS — N950 Postmenopausal bleeding: Secondary | ICD-10-CM | POA: Diagnosis not present

## 2021-11-21 DIAGNOSIS — Z85068 Personal history of other malignant neoplasm of small intestine: Secondary | ICD-10-CM | POA: Diagnosis not present

## 2021-12-10 ENCOUNTER — Encounter: Payer: Self-pay | Admitting: Hematology

## 2021-12-10 ENCOUNTER — Inpatient Hospital Stay: Payer: 59 | Attending: Radiation Oncology | Admitting: Hematology

## 2021-12-10 ENCOUNTER — Inpatient Hospital Stay: Payer: 59

## 2021-12-10 ENCOUNTER — Other Ambulatory Visit: Payer: Self-pay

## 2021-12-10 VITALS — BP 139/50 | HR 100 | Temp 98.4°F | Resp 17 | Wt 131.6 lb

## 2021-12-10 DIAGNOSIS — R112 Nausea with vomiting, unspecified: Secondary | ICD-10-CM | POA: Diagnosis not present

## 2021-12-10 DIAGNOSIS — C49A3 Gastrointestinal stromal tumor of small intestine: Secondary | ICD-10-CM | POA: Diagnosis not present

## 2021-12-10 DIAGNOSIS — Z170 Estrogen receptor positive status [ER+]: Secondary | ICD-10-CM | POA: Diagnosis not present

## 2021-12-10 DIAGNOSIS — D259 Leiomyoma of uterus, unspecified: Secondary | ICD-10-CM | POA: Diagnosis not present

## 2021-12-10 DIAGNOSIS — C50412 Malignant neoplasm of upper-outer quadrant of left female breast: Secondary | ICD-10-CM | POA: Insufficient documentation

## 2021-12-10 DIAGNOSIS — I700 Atherosclerosis of aorta: Secondary | ICD-10-CM | POA: Diagnosis not present

## 2021-12-10 DIAGNOSIS — Z79899 Other long term (current) drug therapy: Secondary | ICD-10-CM | POA: Diagnosis not present

## 2021-12-10 DIAGNOSIS — D509 Iron deficiency anemia, unspecified: Secondary | ICD-10-CM | POA: Insufficient documentation

## 2021-12-10 DIAGNOSIS — Z9049 Acquired absence of other specified parts of digestive tract: Secondary | ICD-10-CM | POA: Insufficient documentation

## 2021-12-10 DIAGNOSIS — Z923 Personal history of irradiation: Secondary | ICD-10-CM | POA: Insufficient documentation

## 2021-12-10 DIAGNOSIS — Q8500 Neurofibromatosis, unspecified: Secondary | ICD-10-CM | POA: Insufficient documentation

## 2021-12-10 DIAGNOSIS — D500 Iron deficiency anemia secondary to blood loss (chronic): Secondary | ICD-10-CM

## 2021-12-10 LAB — CMP (CANCER CENTER ONLY)
ALT: 17 U/L (ref 0–44)
AST: 18 U/L (ref 15–41)
Albumin: 4.1 g/dL (ref 3.5–5.0)
Alkaline Phosphatase: 47 U/L (ref 38–126)
Anion gap: 7 (ref 5–15)
BUN: 11 mg/dL (ref 6–20)
CO2: 25 mmol/L (ref 22–32)
Calcium: 8.6 mg/dL — ABNORMAL LOW (ref 8.9–10.3)
Chloride: 110 mmol/L (ref 98–111)
Creatinine: 0.8 mg/dL (ref 0.44–1.00)
GFR, Estimated: >60 mL/min (ref >60)
Glucose, Bld: 101 mg/dL — ABNORMAL HIGH (ref 70–99)
Potassium: 3.6 mmol/L (ref 3.5–5.1)
Sodium: 142 mmol/L (ref 135–145)
Total Bilirubin: 0.5 mg/dL (ref 0.3–1.2)
Total Protein: 6.7 g/dL (ref 6.5–8.1)

## 2021-12-10 LAB — CBC WITH DIFFERENTIAL (CANCER CENTER ONLY)
Abs Immature Granulocytes: 0.02 10*3/uL (ref 0.00–0.07)
Basophils Absolute: 0.1 10*3/uL (ref 0.0–0.1)
Basophils Relative: 1 %
Eosinophils Absolute: 0.2 10*3/uL (ref 0.0–0.5)
Eosinophils Relative: 4 %
HCT: 35.8 % — ABNORMAL LOW (ref 36.0–46.0)
Hemoglobin: 12.2 g/dL (ref 12.0–15.0)
Immature Granulocytes: 0 %
Lymphocytes Relative: 23 %
Lymphs Abs: 1.6 10*3/uL (ref 0.7–4.0)
MCH: 31.1 pg (ref 26.0–34.0)
MCHC: 34.1 g/dL (ref 30.0–36.0)
MCV: 91.3 fL (ref 80.0–100.0)
Monocytes Absolute: 0.5 10*3/uL (ref 0.1–1.0)
Monocytes Relative: 8 %
Neutro Abs: 4.6 10*3/uL (ref 1.7–7.7)
Neutrophils Relative %: 64 %
Platelet Count: 339 10*3/uL (ref 150–400)
RBC: 3.92 MIL/uL (ref 3.87–5.11)
RDW: 13.1 % (ref 11.5–15.5)
WBC Count: 6.9 10*3/uL (ref 4.0–10.5)
nRBC: 0 % (ref 0.0–0.2)

## 2021-12-10 LAB — FERRITIN: Ferritin: 91 ng/mL (ref 11–307)

## 2021-12-10 LAB — VITAMIN B12: Vitamin B-12: 297 pg/mL (ref 180–914)

## 2021-12-10 NOTE — Progress Notes (Signed)
?[place]   ?[phone] [phone]   ?Clinic Follow up Note  ? ?Patient Care Team: ?Daphene Calamity as PCP - General (Family Medicine) ?[name], Wonda Cheng, MD as PCP - Cardiology (Cardiology) ?[name], MD as Consulting Physician (Radiation Oncology) ?Nicholas Lose, MD as Consulting Physician (Hematology and Oncology) ?Rolm Bookbinder, MD as Consulting Physician (General Surgery) ? ?Date of Service:  12/10/2021 ? ?CHIEF COMPLAINT: f/u of GIST of small bowel, h/o breast cancer ? ?CURRENT THERAPY:  ?-Imatinib, 400 mg daily, starting 05/07/21 ?-tamoxifen, starting 08/2018 ? ?ASSESSMENT & PLAN:  ?VENESA SEMIDEY is a 46 y.o. female with  ? ?1. Multifocal GIST of small bowel, pT4N0Mo, stage IIIB, high grade, mitotic rate 9/HPF ?-initially presented to ED on 02/17/21 with worsening abdominal pain and nausea. CT showed 15.7 cm mass in left mid to lower abdomen, no metastatic disease. MRI showed separation of the mass and the left ovary, and GIST felt to be likely.  ?-resection on 02/26/21 under Dr. Barry Dienes showed multiple tumor in small bowel, with three small lesions which were unable to be resected. Path confirmed GIST, high grade, margins and lymph nodes were negative. ?-staging chest CT 05/15/21 was negative. ?-she began imatinib 400 mg daily on 05/07/21. She is tolerating well without issue. ?-CT AP on 08/21/21 and MRI abdomen on 08/23/21 showed NED. ?-she has been having intermittent abdominal discomfort and nausea with vomiting for the last 3-4 weeks. We will repeat a scan sooner, plan for in the next 2-3 weeks. ?-otherwise, I will see her back in 2 months ?  ?2. NF-1 ?-We discussed that neurofibromatosis is high risk for GIST  ?-genetic testing 05/15/21 confirmed pathogenic variant in NF1. ?  ?3. Iron deficient anemia ?-Likely secondary to tumor bleeding from GIST ?-She previously received IV iron venofer and had vaso-vagal like reaction at the end of second  dose ?-hgb WNL at 12.2 today (12/10/21), ferritin pending ?-She had infusion reaction to venofer with last infusion, I will switch her to [facility] which she tolerated before  ?  ?4.  Malignant neoplasm of upper-outer quadrant of left breast, ER+/PR+/HER2-, stage I (pT1cN0) ?-Oncotype DX score 11 ?-s/p lumpectomy in 04/2018 and adjuvant radiation ?-She is on adjuvant tamoxifen, tolerating well. Plan for 10 years  ?-most recent mammogram 03/21/21 was negative. ?-[place] in 08/2021 showed she is still premenopausal, we discussed option of switching to AI if she has BSO.  I again reviewed the small risk of endometrial cancer from tamoxifen, and suggested her to watch vaginal bleeding and follow-up with GYN closely. ?  ?  ?Plan ?-continue imatinib 400 mg daily ?-plan for restaging CT in next 2-3 weeks, oral contrast given today ?-lab and f/u in 2 months, or sooner if needed  ? ? ?No problem-specific Assessment & Plan notes found for this encounter. ? ? ?SUMMARY OF ONCOLOGIC HISTORY: ?Oncology History Overview Note  ?Cancer Staging ?GIST (gastrointestinal stromal tumor) of small bowel, malignant ([place]) ?Staging form: Gastrointestinal Stromal Tumor - Small Intestinal, Esophageal, Colorectal, Mesenteric, and Peritoneal GIST, AJCC 8th Edition ?- Pathologic stage from 04/03/2021: Stage IIIB (pT4, pN0, cM0, Mitotic Rate: High) - Signed by Truitt Merle, MD on 04/23/2021 ? ?Malignant neoplasm of upper-outer quadrant of left breast in female, estrogen receptor positive ([place]) ?Staging form: Breast, AJCC 8th Edition ?- Clinical stage from 03/20/2018: Stage IA (cT1c, cN0, cM0, G2, ER+, PR+, HER2-) - Signed by Gardenia Phlegm, NP on 05/27/2018 ?- Pathologic stage from 05/07/2018: Stage IA (pT1c, pN0, cM0, G1,  ER+, PR+, HER2-, Oncotype DX score: 11) - Signed by Gardenia Phlegm, NP on 05/27/2018 ? ?  ?Malignant neoplasm of upper-outer quadrant of left breast in female, estrogen receptor positive ([place])  ?03/20/2018 Initial Diagnosis  ?  Screening detected left breast spiculated mass by ultrasound measured 1.2 cm.  Biopsy revealed invasive ductal carcinoma grade 1-2 with high-grade DCIS, ER 95%, PR 95%, Ki-67 1%, HER-2 negative ratio 1.47, T1CN0 stage I a clinical stage ?  ?03/20/2018 Cancer Staging  ? Staging form: Breast, AJCC 8th Edition ?- Clinical stage from 03/20/2018: Stage IA (cT1c, cN0, cM0, G2, ER+, PR+, HER2-) - Signed by Gardenia Phlegm, NP on 05/27/2018 ? ?  ?04/08/2018 Genetic Testing  ? NF1  c.2325G>A (Silent) pathogenic variant was identified.  This variant was reclassified from a VUS to pathogenic on June 20, 2021.  The Common Hereditary Cancer Panel offered by [facility] includes sequencing and/or deletion duplication testing of the following 47 genes: APC, ATM, AXIN2, BARD1, BMPR1A, BRCA1, BRCA2, BRIP1, CDH1, CDKN2A (p14ARF), CDKN2A (p16INK4a), CKD4, CHEK2, CTNNA1, DICER1, EPCAM (Deletion/duplication testing only), GREM1 (promoter region deletion/duplication testing only), KIT, MEN1, MLH1, MSH2, MSH3, MSH6, MUTYH, NBN, NF1, NHTL1, PALB2, PDGFRA, PMS2, POLD1, POLE, PTEN, RAD50, RAD51C, RAD51D, SDHB, SDHC, SDHD, SMAD4, SMARCA4. STK11, TP53, TSC1, TSC2, and VHL.  The following genes were evaluated for sequence changes only: SDHA and HOXB13 c.251G>A variant only. ? ?Results: No pathogenic variants identified.  A Variant of uncertain significance in the gene NF1 was detected c.2325G>A (Silent). The date of this test report is 04/03/2018.  ?  ?05/07/2018 Surgery  ? Left lumpectomy: IDC grade 1, 1.2 cm, intermediate grade DCIS, margins negative,PASH, 0/7 sentinel lymph nodes negative, ER 95%, PR 95%, Ki-67 1%, HER-2 negative ratio 1.47, T1CN0 stage Ia ?  ?05/07/2018 Cancer Staging  ? Staging form: Breast, AJCC 8th Edition ?- Pathologic stage from 05/07/2018: Stage IA (pT1c, pN0, cM0, G1, ER+, PR+, HER2-, Oncotype DX score: 11) - Signed by Gardenia Phlegm, NP on 05/27/2018 ? ?  ?05/25/2018 Oncotype testing  ? Oncotype DX score 11: 3%  risk of distant recurrence of 9 years, low risk ?  ?06/24/2018 - 08/03/2018 Radiation Therapy  ? Adjuvant radiation therapy ?  ?09/2018 -  Anti-estrogen oral therapy  ? Tamoxifen daily ?  ?GIST (gastrointestinal stromal tumor) of small bowel, malignant ([place])  ?04/03/2021 Cancer Staging  ? Staging form: Gastrointestinal Stromal Tumor - Small Intestinal, Esophageal, Colorectal, Mesenteric, and Peritoneal GIST, AJCC 8th Edition ?- Pathologic stage from 04/03/2021: Stage IIIB (pT4, pN0, cM0, Mitotic Rate: High) - Signed by Truitt Merle, MD on 04/23/2021 ?Stage prefix: Initial diagnosis ?Histologic grade (G): High grade ?Histologic grading system: 2 grade system ?Residual tumor (R): R0 - None ? ?  ?04/23/2021 Initial Diagnosis  ? GIST (gastrointestinal stromal tumor) of small bowel, malignant ([place]) ?  ?05/15/2021 Imaging  ? CT Chest w/o contrast ? ?IMPRESSION: ?1. 6 mm nodular opacity in the posterior right lower lobe with a ?similar size focus of architectural distortion/scarring in this ?region on the remote study from 17 years ago, suggesting that this ?is simply scar although the finding is more confluent on today's ?study. While almost assuredly benign, consider follow-up CT chest in 6 months to ensure stability. ?2. No evidence for metastatic disease in the chest. ?  ?08/21/2021 Imaging  ? EXAM: ?CT ABDOMEN AND PELVIS WITH CONTRAST ? ?IMPRESSION: ?Interval surgical resection of large left abdominal cystic mass ?since prior exam. No evidence of tumor recurrence or metastatic ?disease within the abdomen  or pelvis. ?  ?8 mm low-attenuation lesion in pancreatic head, not definitely seen ?on prior exams. Consider further characterization with MRI, or ?continued surveillance on follow-up CT. ?  ?Small posterior uterine fibroid. ?  ?08/23/2021 Imaging  ? EXAM: ?MRI ABDOMEN WITHOUT AND WITH CONTRAST ? ?IMPRESSION: ?1. No evidence of pancreatic mass or suspicious correlate for the CT abnormality, which may have been artifactual or  possibly related to heterogeneous fatty replacement. ?2. No acute process or evidence of metastatic disease in the ?abdomen. ?3.  Aortic Atherosclerosis (ICD10-I70.0). ?  ? ? ? ?INTERVAL HISTORY:  ?RICHELL CORKER is

## 2021-12-12 ENCOUNTER — Telehealth: Payer: Self-pay | Admitting: Hematology

## 2021-12-12 ENCOUNTER — Other Ambulatory Visit (HOSPITAL_COMMUNITY): Payer: Self-pay

## 2021-12-12 NOTE — Telephone Encounter (Signed)
Left message with follow-up appointment per 3/27 los. ?

## 2021-12-14 ENCOUNTER — Other Ambulatory Visit (HOSPITAL_COMMUNITY): Payer: Self-pay

## 2021-12-16 ENCOUNTER — Ambulatory Visit (HOSPITAL_COMMUNITY)
Admission: RE | Admit: 2021-12-16 | Discharge: 2021-12-16 | Disposition: A | Discharge status: Home / Self Care | Payer: 59 | Source: Ambulatory Visit | Attending: Hematology | Admitting: Hematology

## 2021-12-16 DIAGNOSIS — K6389 Other specified diseases of intestine: Secondary | ICD-10-CM | POA: Diagnosis not present

## 2021-12-16 DIAGNOSIS — C49A3 Gastrointestinal stromal tumor of small intestine: Secondary | ICD-10-CM | POA: Diagnosis not present

## 2021-12-16 DIAGNOSIS — K561 Intussusception: Secondary | ICD-10-CM | POA: Diagnosis not present

## 2021-12-16 DIAGNOSIS — K7689 Other specified diseases of liver: Secondary | ICD-10-CM | POA: Diagnosis not present

## 2021-12-16 MED ORDER — IOHEXOL 300 MG/ML  SOLN
100.0000 mL | Freq: Once | INTRAMUSCULAR | Status: AC | PRN
  Administered 2021-12-16: 100 mL via INTRAVENOUS

## 2021-12-18 ENCOUNTER — Encounter: Payer: Self-pay | Admitting: Hematology

## 2021-12-18 ENCOUNTER — Other Ambulatory Visit (HOSPITAL_COMMUNITY): Payer: Self-pay

## 2021-12-18 ENCOUNTER — Other Ambulatory Visit: Payer: Self-pay | Admitting: Hematology

## 2021-12-18 ENCOUNTER — Telehealth: Payer: Self-pay

## 2021-12-18 ENCOUNTER — Telehealth: Payer: Self-pay | Admitting: Hematology

## 2021-12-18 MED ORDER — IMATINIB MESYLATE 100 MG PO TABS
200.0000 mg | ORAL_TABLET | Freq: Every day | ORAL | 0 refills | Status: DC
  Filled 2021-12-18 – 2021-12-21 (×4): qty 60, 30d supply, fill #0

## 2021-12-18 NOTE — Telephone Encounter (Signed)
Spoke with pt via telephone regarding appt today with Dr. Michaelle Birks at 2pm.  Pt confirmed appt and notified Dr. Zenia Resides and Dr Burr Medico that pt will be at Dr. Ayesha Rumpf office today by 2pm. ?

## 2021-12-18 NOTE — Telephone Encounter (Signed)
I reviewed pt's recent CT scan and called her this morning. She still has intermittent nausea, usually in the morning, BM is regular every 2-3 days which is common for her, occasional abdominal cramps. Her symptoms are slightly better with laxatives on board recently. I discussed the CT scan findings, which unfortunately showed GIST recurrence at the  jejunal anastomosis and a short segment small bowel intussusception (I reviewed with Dr. Johney Maine last night and we feel this is not impressive and OK to monitor depends on her symptoms now). I will make an urgent referral back to her surgeon. Dr. Barry Dienes is out of office this month, will check if other surgeons can see her. I recommend her to have low fiber diet, and take laxatives daily, and go to ED if she has worsening symptoms of SBO. I will increase her Gleevec dose to '600mg'$  if she can tolerate, will call in '100mg'$  tabs for her (she is on '400mg'$  daily now). We also discussed second line therapy. All questions were answered.  ? ?Truitt Merle ?12/18/2021 8:37 AM  ? ?

## 2021-12-19 ENCOUNTER — Telehealth: Payer: Self-pay

## 2021-12-19 ENCOUNTER — Other Ambulatory Visit (HOSPITAL_COMMUNITY): Payer: Self-pay

## 2021-12-19 DIAGNOSIS — R1033 Periumbilical pain: Secondary | ICD-10-CM | POA: Diagnosis not present

## 2021-12-19 DIAGNOSIS — Q8500 Neurofibromatosis, unspecified: Secondary | ICD-10-CM | POA: Diagnosis not present

## 2021-12-19 DIAGNOSIS — C49A3 Gastrointestinal stromal tumor of small intestine: Secondary | ICD-10-CM | POA: Diagnosis not present

## 2021-12-19 NOTE — Telephone Encounter (Signed)
Oral Oncology Patient Advocate Encounter ?  ?Received notification from [place] that prior authorization for Gleevec ('100mg'$  tab) is required. ?  ?PA submitted on CoverMyMeds ?Key [id number] ?Status is pending ?  ?Oral Oncology Clinic will continue to follow. ? ?Wynn Maudlin CPHT ?Specialty Pharmacy Patient Advocate ?[place] ?Phone [phone] ?Fax [phone] ?12/19/2021 9:09 AM ? ? ?

## 2021-12-20 ENCOUNTER — Other Ambulatory Visit (HOSPITAL_COMMUNITY): Payer: Self-pay

## 2021-12-20 ENCOUNTER — Telehealth: Payer: Self-pay

## 2021-12-20 ENCOUNTER — Encounter: Payer: Self-pay | Admitting: Hematology

## 2021-12-20 DIAGNOSIS — C49A3 Gastrointestinal stromal tumor of small intestine: Secondary | ICD-10-CM

## 2021-12-20 NOTE — Telephone Encounter (Signed)
Patient has been scheduled for Enteroscopy with Dr. Fuller Plan on 01/24/22. PV telephone visit scheduled for 01/10/22 at 8:00 am. Patient is aware & has been placed on waiting list if a sooner appt becomes available. Amb ref placed. ?

## 2021-12-20 NOTE — Telephone Encounter (Signed)
Oral Oncology Pharmacist Encounter ? ?Prior Authorization for Imatinib 100 mg tablets has been approved.   ? ?PA# [id number]  ?Effective dates: 12/19/21 through 12/03/24 ? ?PA has approved 2 tablets per day of the 100 mg tablets.  ? ?Leron Croak, PharmD, BCPS ?Hematology/Oncology Clinical Pharmacist ?[facility] ?[phone] ?12/20/2021 8:24 AM ? ? ? ?

## 2021-12-20 NOTE — Telephone Encounter (Signed)
-----   Message from Ladene Artist, MD sent at 12/19/2021  5:17 PM EDT ----- ?Regarding: RE: Recurrent GIST ?[place],  ? ?Yes a standard push enteroscopy should be able to reach this area. We will contact her to schedule. ? ?Norberto Sorenson  ? ?----- Message ----- ?From: Dwan Bolt, MD ?Sent: 12/19/2021   4:46 PM EDT ?To: Ladene Artist, MD ?Subject: Recurrent GIST                                ? ?Dr. Fuller Plan, ?This is a patient who has seen you in the past for EGD and colonoscopy. She had a large GIST resected last July from the small bowel, and has been having abdominal pain over the last few weeks. CT scan shows some thickening and concern for recurrent disease at her small bowel anastomosis, which is about 10cm distal to the ligament of treitz. Do you think this area could be reached with a scope to further evaluate this? She is not currently obstructed so I would like to work this up a little further if possible before rushing in to surgery. Thank you for considering. ?-[place]  ?[phone] ? ? ?

## 2021-12-21 ENCOUNTER — Other Ambulatory Visit (HOSPITAL_COMMUNITY): Payer: Self-pay

## 2021-12-21 ENCOUNTER — Telehealth: Payer: Self-pay

## 2021-12-21 ENCOUNTER — Other Ambulatory Visit: Payer: Self-pay

## 2021-12-21 NOTE — Telephone Encounter (Signed)
Spoke with Chasity at [place] regarding change in pt's Gleevec dose from '400mg'$  to '600mg'$ .  Chasity processed order for '100mg'$  BID to be sent to pt on Monday 12/24/2021.  Spoke with pt via telephone regarding change in dose.  Instructed pt to when she gets the '100mg'$  tabs on Monday 12/24/2021 start taking '500mg'$  for a few days to see if she tolerates the new dose and if so, start taking the full '600mg'$ .  Notify Dr. Ernestina Penna office should she experience complications.  Pt stated that she's already taking '600mg'$  and she's taking 1ea '400mg'$  pill and 1/2ea of '400mg'$  pill for a total of '600mg'$ .  Pt verbalized understanding of instructions and has no further questions or concerns at this time. ?

## 2021-12-24 ENCOUNTER — Other Ambulatory Visit (HOSPITAL_COMMUNITY): Payer: Self-pay

## 2021-12-31 ENCOUNTER — Other Ambulatory Visit (HOSPITAL_COMMUNITY): Payer: Self-pay

## 2022-01-03 ENCOUNTER — Encounter: Payer: Self-pay | Admitting: Hematology

## 2022-01-03 ENCOUNTER — Other Ambulatory Visit: Payer: Self-pay

## 2022-01-03 ENCOUNTER — Other Ambulatory Visit (HOSPITAL_COMMUNITY): Payer: Self-pay

## 2022-01-03 MED ORDER — ONDANSETRON HCL 8 MG PO TABS
8.0000 mg | ORAL_TABLET | Freq: Three times a day (TID) | ORAL | 1 refills | Status: DC | PRN
  Filled 2022-01-03: qty 20, 7d supply, fill #0
  Filled 2022-02-08: qty 20, 7d supply, fill #1

## 2022-01-09 ENCOUNTER — Other Ambulatory Visit (HOSPITAL_COMMUNITY): Payer: Self-pay

## 2022-01-10 ENCOUNTER — Ambulatory Visit (AMBULATORY_SURGERY_CENTER): Payer: 59

## 2022-01-10 VITALS — Ht 62.0 in | Wt 130.0 lb

## 2022-01-10 DIAGNOSIS — C49A3 Gastrointestinal stromal tumor of small intestine: Secondary | ICD-10-CM

## 2022-01-10 NOTE — Progress Notes (Signed)
No egg or soy allergy known to patient  ?No issues known to pt with past sedation with any surgeries or procedures ?Patient denies ever being told they had issues or difficulty with intubation  ?No FH of Malignant Hyperthermia ?Pt is not on diet pills ?Pt is not on home 02  ?Pt is not on blood thinners  ?Pt reports issues with constipation -- taking a stool softener daily to prevent recurrent bowel obstruction- freq of q2-3 days ?No A fib or A flutter ? ?PV completed over the phone. Pt verified name, DOB, address and insurance during PV today.  ? ?Pt encouraged to call with questions or issues.  ? ?Procedure instructions sent via My Chart  ?Insurance confirmed with pt at [facility] today  ? ?

## 2022-01-14 ENCOUNTER — Other Ambulatory Visit (HOSPITAL_COMMUNITY): Payer: Self-pay

## 2022-01-15 ENCOUNTER — Encounter: Payer: Self-pay | Admitting: Gastroenterology

## 2022-01-15 ENCOUNTER — Other Ambulatory Visit: Payer: Self-pay | Admitting: Hematology

## 2022-01-15 ENCOUNTER — Other Ambulatory Visit (HOSPITAL_COMMUNITY): Payer: Self-pay

## 2022-01-15 MED ORDER — IMATINIB MESYLATE 100 MG PO TABS
200.0000 mg | ORAL_TABLET | Freq: Every day | ORAL | 0 refills | Status: DC
  Filled 2022-01-17: qty 60, 30d supply, fill #0

## 2022-01-15 MED ORDER — IMATINIB MESYLATE 400 MG PO TABS
400.0000 mg | ORAL_TABLET | Freq: Every day | ORAL | 3 refills | Status: DC
  Filled 2022-01-17: qty 30, 30d supply, fill #0
  Filled 2022-02-12: qty 30, 30d supply, fill #1
  Filled 2022-03-12: qty 30, 30d supply, fill #2
  Filled 2022-04-12: qty 30, 30d supply, fill #3

## 2022-01-16 ENCOUNTER — Other Ambulatory Visit (HOSPITAL_COMMUNITY): Payer: Self-pay

## 2022-01-17 ENCOUNTER — Other Ambulatory Visit (HOSPITAL_COMMUNITY): Payer: Self-pay

## 2022-01-22 ENCOUNTER — Other Ambulatory Visit (HOSPITAL_COMMUNITY): Payer: Self-pay

## 2022-01-22 DIAGNOSIS — Z79899 Other long term (current) drug therapy: Secondary | ICD-10-CM | POA: Diagnosis not present

## 2022-01-22 DIAGNOSIS — F330 Major depressive disorder, recurrent, mild: Secondary | ICD-10-CM | POA: Diagnosis not present

## 2022-01-22 DIAGNOSIS — F411 Generalized anxiety disorder: Secondary | ICD-10-CM | POA: Diagnosis not present

## 2022-01-22 MED ORDER — ALPRAZOLAM 0.5 MG PO TABS
ORAL_TABLET | ORAL | 1 refills | Status: DC
  Filled 2022-01-22: qty 180, 90d supply, fill #0

## 2022-01-22 MED ORDER — VENLAFAXINE HCL ER 75 MG PO CP24
ORAL_CAPSULE | ORAL | 1 refills | Status: DC
  Filled 2022-01-22 – 2022-04-15 (×2): qty 90, 90d supply, fill #0
  Filled 2022-07-03: qty 90, 90d supply, fill #1

## 2022-01-24 ENCOUNTER — Encounter: Payer: Self-pay | Admitting: Gastroenterology

## 2022-01-24 ENCOUNTER — Ambulatory Visit (AMBULATORY_SURGERY_CENTER): Payer: 59 | Admitting: Gastroenterology

## 2022-01-24 VITALS — BP 136/61 | HR 82 | Temp 98.6°F | Resp 13 | Ht 62.0 in | Wt 130.0 lb

## 2022-01-24 DIAGNOSIS — R1033 Periumbilical pain: Secondary | ICD-10-CM

## 2022-01-24 DIAGNOSIS — C49A3 Gastrointestinal stromal tumor of small intestine: Secondary | ICD-10-CM | POA: Diagnosis not present

## 2022-01-24 DIAGNOSIS — I10 Essential (primary) hypertension: Secondary | ICD-10-CM | POA: Diagnosis not present

## 2022-01-24 DIAGNOSIS — F32A Depression, unspecified: Secondary | ICD-10-CM | POA: Diagnosis not present

## 2022-01-24 DIAGNOSIS — M797 Fibromyalgia: Secondary | ICD-10-CM | POA: Diagnosis not present

## 2022-01-24 DIAGNOSIS — F419 Anxiety disorder, unspecified: Secondary | ICD-10-CM | POA: Diagnosis not present

## 2022-01-24 DIAGNOSIS — R933 Abnormal findings on diagnostic imaging of other parts of digestive tract: Secondary | ICD-10-CM | POA: Diagnosis not present

## 2022-01-24 DIAGNOSIS — K319 Disease of stomach and duodenum, unspecified: Secondary | ICD-10-CM | POA: Diagnosis not present

## 2022-01-24 DIAGNOSIS — K9189 Other postprocedural complications and disorders of digestive system: Secondary | ICD-10-CM | POA: Diagnosis not present

## 2022-01-24 MED ORDER — SODIUM CHLORIDE 0.9 % IV SOLN: 500.0000 mL | INTRAVENOUS | Status: DC

## 2022-01-24 NOTE — Progress Notes (Signed)
Report to PACU, RN, vss, BBS= Clear.  

## 2022-01-24 NOTE — Op Note (Addendum)
[facility] ?Patient Name: Anne Horton ?Procedure Date: 01/24/2022 2:57 PM ?MRN: [id number] ?Endoscopist: Ladene Artist , MD ?Age: 46 ?Referring MD:  ?Date of Birth: 02/15/1976 ?Gender: Female ?Account #: [id number] ?Procedure:                Small bowel enteroscopy ?Indications:              Periumbilical abdominal distress/pain, Abnormal  ?                          abdominal CT (jejunum) ?Medicines:                Monitored Anesthesia Care ?Procedure:                After obtaining informed consent, the endoscope was  ?                          passed under direct vision. Throughout the  ?                          procedure, the patient's blood pressure, pulse, and  ?                          oxygen saturations were monitored continuously. The  ?                          [id number] [id number]-[id number] Slim SB Colonoscope was introduced  ?                          through the mouth, and advanced to the proximal  ?                          jejunum. After obtaining informed consent, the  ?                          endoscope was passed under direct vision.  ?                          Throughout the procedure, the patient's blood  ?                          pressure, pulse, and oxygen saturations were  ?                          monitored continuously. ?Scope In: ?Scope Out: ?Findings:                 There was evidence of a patent previous surgical  ?                          anastomosis in the proximal jejunum. This was  ?                          characterized by focal soft nodular mucosa. The  ?                          anastomosis was patent.  No luminal mass noted.  ?                          Biopsies were taken with a cold forceps for  ?                          histology. Unable to advance beyond the anastomosis  ?                          due to scope looping. ?                          There was no evidence of significant pathology in  ?                          the entire examined duodenum. ?                           Patchy mildly erythematous mucosa without bleeding  ?                          was found in the gastric body and in the gastric  ?                          antrum. Biopsies were taken with a cold forceps for  ?                          histology. ?                          The exam of the stomach was otherwise normal. ?                          The examined esophagus was normal. ?Complications:            No immediate complications. ?Estimated Blood Loss:     Estimated blood loss was minimal. ?Impression:               - Patent previous surgical anastomosis in the  ?                          proximal jejunum, characterized by focal soft  ?                          nodular mucosa. Biopsied. ?                          - Normal examined duodenum. ?                          - Erythematous mucosa in the gastric body and  ?                          antrum. Biopsied. ?                          -  Normal esophagus. ?Recommendation:           - The patient will be observed post-procedure,  ?                          until all discharge criteria are met. ?                          - Discharge patient to home (ambulatory). ?                          - Resume previous diet today. ?                          - Await pathology results. ?                          - Return to [place] office at appointment to be  ?                          scheduled. ?Ladene Artist, MD ?01/24/2022 3:42:48 PM ?This report has been signed electronically. ?

## 2022-01-24 NOTE — Patient Instructions (Signed)
?Resume previous diet and medications. Awaiting pathology results. Return to [place] office at appointment to be scheduled. ? ?YOU HAD AN ENDOSCOPIC PROCEDURE TODAY AT [facility]:   Refer to the procedure report that was given to you for any specific questions about what was found during the examination.  If the procedure report does not answer your questions, please call your gastroenterologist to clarify.  If you requested that your care partner not be given the details of your procedure findings, then the procedure report has been included in a sealed envelope for you to review at your convenience later. ? ?YOU SHOULD EXPECT: Some feelings of bloating in the abdomen. Passage of more gas than usual.  Walking can help get rid of the air that was put into your GI tract during the procedure and reduce the bloating. If you had a lower endoscopy (such as a colonoscopy or flexible sigmoidoscopy) you may notice spotting of blood in your stool or on the toilet paper. If you underwent a bowel prep for your procedure, you may not have a normal bowel movement for a few days. ? ?Please Note:  You might notice some irritation and congestion in your nose or some drainage.  This is from the oxygen used during your procedure.  There is no need for concern and it should clear up in a day or so. ? ?SYMPTOMS TO REPORT IMMEDIATELY: ? ?Following lower endoscopy (colonoscopy or flexible sigmoidoscopy): ? Excessive amounts of blood in the stool ? Significant tenderness or worsening of abdominal pains ? Swelling of the abdomen that is new, acute ? Fever of 100?F or higher ? ?Following upper endoscopy (EGD) ? Vomiting of blood or coffee ground material ? New chest pain or pain under the shoulder blades ? Painful or persistently difficult swallowing ? New shortness of breath ? Fever of 100?F or higher ? Black, tarry-looking stools ? ?For urgent or emergent issues, a gastroenterologist can be reached at any hour by calling  [phone]. ?Do not use MyChart messaging for urgent concerns.  ? ? ?DIET:  We do recommend a small meal at first, but then you may proceed to your regular diet.  Drink plenty of fluids but you should avoid alcoholic beverages for 24 hours. ? ?ACTIVITY:  You should plan to take it easy for the rest of today and you should NOT DRIVE or use heavy machinery until tomorrow (because of the sedation medicines used during the test).   ? ?FOLLOW UP: ?Our staff will call the number listed on your records 48-72 hours following your procedure to check on you and address any questions or concerns that you may have regarding the information given to you following your procedure. If we do not reach you, we will leave a message.  We will attempt to reach you two times.  During this call, we will ask if you have developed any symptoms of COVID 19. If you develop any symptoms (ie: fever, flu-like symptoms, shortness of breath, cough etc.) before then, please call [phone].  If you test positive for Covid 19 in the 2 weeks post procedure, please call and report this information to [place].   ? ?If any biopsies were taken you will be contacted by phone or by letter within the next 1-3 weeks.  Please call us at [phone] if you have not heard about the biopsies in 3 weeks.  ? ? ?SIGNATURES/CONFIDENTIALITY: ?You and/or your care partner have signed paperwork which will be entered into your  electronic medical record.  These signatures attest to the fact that that the information above on your After Visit Summary has been reviewed and is understood.  Full responsibility of the confidentiality of this discharge information lies with you and/or your care-partner.  ?

## 2022-01-24 NOTE — Progress Notes (Signed)
Called to room to assist during endoscopic procedure.  Patient ID and intended procedure confirmed with present staff. Received instructions for my participation in the procedure from the performing physician.  

## 2022-01-24 NOTE — Progress Notes (Signed)
? ?History & Physical ? ?Primary Care Physician:  Osie Cheeks, PA-C ?Primary Gastroenterologist: Lucio Edward, MD ? ?CHIEF COMPLAINT: Periumbilical abdominal pain, abnormal CT of the proximal jejunum ? ?HPI: Anne Horton is a 46 y.o. female with a history of a jejunal GIST tumor status postresection in 2022.  She presents with periumbilical abdominal pain and a CT AP showing a masslike soft tissue thickening at the jejunal anastomosis.  She is scheduled for push enteroscopy. ? ? ?Past Medical History:  ?Diagnosis Date  ? Allergy   ? Anemia   ? Anxiety   ? Breast cancer ([place]) 05/07/2018  ? left invasive ductal   ? Cancer ([place])   ? Chest pain   ? Depression   ? Family history of breast cancer   ? Fibromyalgia   ? neurofibromatosis  ? H/O: depression   ? Hx of migraines   ? Mitral regurgitation   ? mild per echo EF 55-60%  ? Neurofibromatosis ([place])   ? Palpitations   ? Personal history of radiation therapy 08/10/2018  ? left breast 06/24/2018-08/10/2018  Dr [name]  ? Tachycardia   ? Hx. of  ? Tachycardia   ? Wears contact lenses   ? ? ?Past Surgical History:  ?Procedure Laterality Date  ? BOWEL RESECTION N/A 04/03/2021  ? Procedure: SMALL BOWEL RESECTION;  Surgeon: Nan Maya Klein, MD;  Location: [facility];  Service: General;  Laterality: N/A;  ? BREAST BIOPSY    ? BREAST LUMPECTOMY Left 05/07/2018  ? Invasive ductal   ? BREAST LUMPECTOMY WITH RADIOACTIVE SEED AND SENTINEL LYMPH NODE BIOPSY Left 05/07/2018  ? Procedure: BREAST LUMPECTOMY WITH RADIOACTIVE SEED AND SENTINEL LYMPH NODE BIOPSY;  Surgeon: Rolm Bookbinder, MD;  Location: [facility];  Service: General;  Laterality: Left;  ? CESAREAN SECTION    ? CHOLECYSTECTOMY N/A 01/04/2015  ? Procedure: LAPAROSCOPIC CHOLECYSTECTOMY WITH INTRAOPERATIVE CHOLANGIOGRAM;  Surgeon: Erroll Luna, MD;  Location: [facility];  Service: General;  Laterality: N/A;  ? COLONOSCOPY    ? ESOPHAGOGASTRODUODENOSCOPY    ? LAPAROTOMY N/A  04/03/2021  ? Procedure: EXPLORATORY LAPAROTOMY WITH RESECTION OF ABDOMINAL MASS;  Surgeon: Jasenia Weilbacher Klein, MD;  Location: [facility];  Service: General;  Laterality: N/A;  ? MASTOPEXY Bilateral 09/05/2020  ? Procedure: BILATERAL BREAST MASTOPEXY;  Surgeon: Irene Limbo, MD;  Location: [facility];  Service: Plastics;  Laterality: Bilateral;  ? OMENTECTOMY N/A 04/03/2021  ? Procedure: OMENTECTOMY;  Surgeon: Semaj Kham Klein, MD;  Location: [facility];  Service: General;  Laterality: N/A;  ? WISDOM TOOTH EXTRACTION    ? ? ?Prior to Admission medications   ?Medication Sig Start Date End Date Taking? Authorizing Provider  ?ALPRAZolam (XANAX) 0.5 MG tablet Take 1/2 to 1 tablet by mouth 2 times a day as needed 01/26/21  Yes   ?ALPRAZolam (XANAX) 0.5 MG tablet Take 1/2 to 1 tablet by mouth 2 times a day as needed 01/22/22  Yes   ?CALCIUM PO Take 2 tablets by mouth daily at 6 (six) AM. GUMMY   Yes [provider]  ?cetirizine (ZYRTEC) 10 MG tablet Take 10 mg by mouth daily.   Yes [provider]  ?imatinib (GLEEVEC) 100 MG tablet Take 2 tablets (200 mg total) by mouth daily. Take in addition to '400mg'$  daily. Take with meals and large glass of water.Caution:Chemotherapy 01/15/22  Yes Truitt Merle, MD  ?imatinib (GLEEVEC) 400 MG tablet Take 1 tablet (400 mg total) by mouth daily. Take with meals and large glass  of water.Caution:Chemotherapy. 01/15/22  Yes Truitt Merle, MD  ?metoprolol succinate (TOPROL-XL) 50 MG 24 hr tablet Take 1 tablet (50 mg total) by mouth daily. 10/25/21  Yes [name], Wonda Cheng, MD  ?Multiple Vitamin (MULTIVITAMIN) tablet Take 1 tablet by mouth daily.   Yes [provider]  ?ondansetron (ZOFRAN) 8 MG tablet Take 1 tablet (8 mg total) by mouth every 8 (eight) hours as needed for nausea or vomiting. 01/03/22  Yes Truitt Merle, MD  ?Orlie Dakin Sodium (SENNA S PO) Take 1 capsule by mouth daily at 6 (six) AM.   Yes [provider]  ?tamoxifen (NOLVADEX) 20 MG tablet TAKE 1 TABLET  BY MOUTH ONCE A DAY 09/28/21 09/28/22 Yes Nicholas Lose, MD  ?venlafaxine XR (EFFEXOR-XR) 75 MG 24 hr capsule Take 1 capsule by mouth daily. 01/22/22  Yes   ?propranolol (INDERAL) 10 MG tablet TAKE 1 TABLET BY MOUTH FOUR TIMES DAILY AS NEEDED FOR PALPITATIONS 09/28/21 09/28/22  [name], Wonda Cheng, MD  ? ? ?Current Outpatient Medications  ?Medication Sig Dispense Refill  ? ALPRAZolam (XANAX) 0.5 MG tablet Take 1/2 to 1 tablet by mouth 2 times a day as needed 180 tablet 1  ? ALPRAZolam (XANAX) 0.5 MG tablet Take 1/2 to 1 tablet by mouth 2 times a day as needed 180 tablet 1  ? CALCIUM PO Take 2 tablets by mouth daily at 6 (six) AM. GUMMY    ? cetirizine (ZYRTEC) 10 MG tablet Take 10 mg by mouth daily.    ? imatinib (GLEEVEC) 100 MG tablet Take 2 tablets (200 mg total) by mouth daily. Take in addition to '400mg'$  daily. Take with meals and large glass of water.Caution:Chemotherapy 60 tablet 0  ? imatinib (GLEEVEC) 400 MG tablet Take 1 tablet (400 mg total) by mouth daily. Take with meals and large glass of water.Caution:Chemotherapy. 30 tablet 3  ? metoprolol succinate (TOPROL-XL) 50 MG 24 hr tablet Take 1 tablet (50 mg total) by mouth daily. 90 tablet 3  ? Multiple Vitamin (MULTIVITAMIN) tablet Take 1 tablet by mouth daily.    ? ondansetron (ZOFRAN) 8 MG tablet Take 1 tablet (8 mg total) by mouth every 8 (eight) hours as needed for nausea or vomiting. 20 tablet 1  ? Sennosides-Docusate Sodium (SENNA S PO) Take 1 capsule by mouth daily at 6 (six) AM.    ? tamoxifen (NOLVADEX) 20 MG tablet TAKE 1 TABLET BY MOUTH ONCE A DAY 90 tablet 3  ? venlafaxine XR (EFFEXOR-XR) 75 MG 24 hr capsule Take 1 capsule by mouth daily. 90 capsule 1  ? propranolol (INDERAL) 10 MG tablet TAKE 1 TABLET BY MOUTH FOUR TIMES DAILY AS NEEDED FOR PALPITATIONS 120 tablet 3  ? ?Current Facility-Administered Medications  ?Medication Dose Route Frequency Provider Last Rate Last Admin  ? 0.9 %  sodium chloride infusion  500 mL Intravenous Continuous Ladene Artist, MD      ? ? ?Allergies as of 01/24/2022 - Review Complete 01/24/2022  ?Allergen Reaction Noted  ? Iron sucrose Hives, Rash, and Other (See Comments) 09/13/2021  ? Latex Rash   ? ? ?Family History  ?Problem Relation Age of Onset  ? Breast cancer Mother 34  ?     estrogen negative, recurred in 2012  ? Diabetes Mother   ? Neurofibromatosis Father   ? Heart disease Paternal Uncle   ? Diabetes Paternal Uncle   ? Atrial fibrillation Maternal Grandfather   ? Cancer Paternal Grandmother   ?     type unk, died at  22  ? Neurofibromatosis Paternal Grandmother   ? Stroke Paternal Grandfather   ? Heart attack Neg Hx   ? Hypertension Neg Hx   ? Colon cancer Neg Hx   ? Colon polyps Neg Hx   ? Esophageal cancer Neg Hx   ? Rectal cancer Neg Hx   ? Stomach cancer Neg Hx   ? ? ?Social History  ? ?Socioeconomic History  ? Marital status: Married  ?  Spouse name: Not on file  ? Number of children: 1  ? Years of education: Not on file  ? Highest education level: Not on file  ?Occupational History  ? Occupation: [employer]  ?Tobacco Use  ? Smoking status: Never  ? Smokeless tobacco: Never  ?Vaping Use  ? Vaping Use: Never used  ?Substance and Sexual Activity  ? Alcohol use: Not Currently  ?  Alcohol/week: 0.0 - 1.0 standard drinks  ?  Comment: occasional but rarely  ? Drug use: No  ? Sexual activity: Yes  ?  Birth control/protection: None  ?Other Topics Concern  ? Not on file  ?Social History Narrative  ? Pt lives with husband and daughter  ? ?Social Determinants of Health  ? ?Financial Resource Strain: Not on file  ?Food Insecurity: Not on file  ?Transportation Needs: Not on file  ?Physical Activity: Not on file  ?Stress: Not on file  ?Social Connections: Not on file  ?Intimate Partner Violence: Not on file  ? ? ?Review of Systems: ? ?All systems reviewed an negative except where noted in HPI. ? ?Gen: Denies any fever, chills, sweats, anorexia, fatigue, weakness, malaise, weight loss, and sleep disorder ?CV: Denies chest pain, angina,  palpitations, syncope, orthopnea, PND, peripheral edema, and claudication. ?Resp: Denies dyspnea at rest, dyspnea with exercise, cough, sputum, wheezing, coughing up blood, and pleurisy. ?GI: Denies vomiting bloo

## 2022-01-24 NOTE — Progress Notes (Signed)
Pt's states no medical or surgical changes since previsit or office visit. 

## 2022-01-25 ENCOUNTER — Encounter: Payer: Self-pay | Admitting: Hematology

## 2022-01-28 ENCOUNTER — Telehealth: Payer: Self-pay | Admitting: *Deleted

## 2022-01-28 NOTE — Telephone Encounter (Signed)
?  Follow up Call- ? ? ?  01/24/2022  ?  2:09 PM  ?Call back number  ?Post procedure Call Back phone  # [phone]  ?Permission to leave phone message Yes  ?  ? ?Patient questions: ? ?Do you have a fever, pain , or abdominal swelling? No. ?Pain Score  0 * ? ?Have you tolerated food without any problems? Yes.   ? ?Have you been able to return to your normal activities? Yes.   ? ?Do you have any questions about your discharge instructions: ?Diet   No. ?Medications  No. ?Follow up visit  No. ? ?Do you have questions or concerns about your Care? No. ? ?Actions: ?* If pain score is 4 or above: ?No action needed, pain <4. ? ? ?

## 2022-01-30 ENCOUNTER — Other Ambulatory Visit (HOSPITAL_COMMUNITY): Payer: Self-pay

## 2022-01-31 DIAGNOSIS — H5213 Myopia, bilateral: Secondary | ICD-10-CM | POA: Diagnosis not present

## 2022-02-01 DIAGNOSIS — C49A3 Gastrointestinal stromal tumor of small intestine: Secondary | ICD-10-CM | POA: Diagnosis not present

## 2022-02-07 ENCOUNTER — Encounter: Payer: Self-pay | Admitting: Gastroenterology

## 2022-02-07 ENCOUNTER — Encounter: Payer: Self-pay | Admitting: Hematology

## 2022-02-07 ENCOUNTER — Other Ambulatory Visit: Payer: Self-pay

## 2022-02-07 ENCOUNTER — Inpatient Hospital Stay: Payer: 59

## 2022-02-07 ENCOUNTER — Inpatient Hospital Stay: Payer: 59 | Attending: Radiation Oncology | Admitting: Hematology

## 2022-02-07 VITALS — BP 121/51 | HR 97 | Temp 99.0°F | Resp 18 | Ht 62.0 in | Wt 131.5 lb

## 2022-02-07 DIAGNOSIS — Z79899 Other long term (current) drug therapy: Secondary | ICD-10-CM | POA: Diagnosis not present

## 2022-02-07 DIAGNOSIS — Z170 Estrogen receptor positive status [ER+]: Secondary | ICD-10-CM | POA: Insufficient documentation

## 2022-02-07 DIAGNOSIS — C50412 Malignant neoplasm of upper-outer quadrant of left female breast: Secondary | ICD-10-CM | POA: Insufficient documentation

## 2022-02-07 DIAGNOSIS — I700 Atherosclerosis of aorta: Secondary | ICD-10-CM | POA: Diagnosis not present

## 2022-02-07 DIAGNOSIS — D509 Iron deficiency anemia, unspecified: Secondary | ICD-10-CM | POA: Insufficient documentation

## 2022-02-07 DIAGNOSIS — Z9049 Acquired absence of other specified parts of digestive tract: Secondary | ICD-10-CM | POA: Diagnosis not present

## 2022-02-07 DIAGNOSIS — D259 Leiomyoma of uterus, unspecified: Secondary | ICD-10-CM | POA: Insufficient documentation

## 2022-02-07 DIAGNOSIS — Z923 Personal history of irradiation: Secondary | ICD-10-CM | POA: Insufficient documentation

## 2022-02-07 DIAGNOSIS — C49A3 Gastrointestinal stromal tumor of small intestine: Secondary | ICD-10-CM

## 2022-02-07 DIAGNOSIS — Q8500 Neurofibromatosis, unspecified: Secondary | ICD-10-CM | POA: Diagnosis not present

## 2022-02-07 DIAGNOSIS — K561 Intussusception: Secondary | ICD-10-CM | POA: Diagnosis not present

## 2022-02-07 DIAGNOSIS — D500 Iron deficiency anemia secondary to blood loss (chronic): Secondary | ICD-10-CM

## 2022-02-07 DIAGNOSIS — R918 Other nonspecific abnormal finding of lung field: Secondary | ICD-10-CM | POA: Insufficient documentation

## 2022-02-07 DIAGNOSIS — Z8659 Personal history of other mental and behavioral disorders: Secondary | ICD-10-CM | POA: Insufficient documentation

## 2022-02-07 LAB — CBC WITH DIFFERENTIAL (CANCER CENTER ONLY)
Abs Immature Granulocytes: 0.01 10*3/uL (ref 0.00–0.07)
Basophils Absolute: 0.1 10*3/uL (ref 0.0–0.1)
Basophils Relative: 1 %
Eosinophils Absolute: 0.2 10*3/uL (ref 0.0–0.5)
Eosinophils Relative: 3 %
HCT: 35.4 % — ABNORMAL LOW (ref 36.0–46.0)
Hemoglobin: 12.1 g/dL (ref 12.0–15.0)
Immature Granulocytes: 0 %
Lymphocytes Relative: 20 %
Lymphs Abs: 1.1 10*3/uL (ref 0.7–4.0)
MCH: 32.2 pg (ref 26.0–34.0)
MCHC: 34.2 g/dL (ref 30.0–36.0)
MCV: 94.1 fL (ref 80.0–100.0)
Monocytes Absolute: 0.4 10*3/uL (ref 0.1–1.0)
Monocytes Relative: 7 %
Neutro Abs: 3.7 10*3/uL (ref 1.7–7.7)
Neutrophils Relative %: 69 %
Platelet Count: 351 10*3/uL (ref 150–400)
RBC: 3.76 MIL/uL — ABNORMAL LOW (ref 3.87–5.11)
RDW: 13.6 % (ref 11.5–15.5)
WBC Count: 5.4 10*3/uL (ref 4.0–10.5)
nRBC: 0 % (ref 0.0–0.2)

## 2022-02-07 LAB — CMP (CANCER CENTER ONLY)
ALT: 24 U/L (ref 0–44)
AST: 26 U/L (ref 15–41)
Albumin: 4.2 g/dL (ref 3.5–5.0)
Alkaline Phosphatase: 46 U/L (ref 38–126)
Anion gap: 7 (ref 5–15)
BUN: 9 mg/dL (ref 6–20)
CO2: 25 mmol/L (ref 22–32)
Calcium: 8.6 mg/dL — ABNORMAL LOW (ref 8.9–10.3)
Chloride: 107 mmol/L (ref 98–111)
Creatinine: 0.79 mg/dL (ref 0.44–1.00)
GFR, Estimated: >60 mL/min (ref >60)
Glucose, Bld: 94 mg/dL (ref 70–99)
Potassium: 3.6 mmol/L (ref 3.5–5.1)
Sodium: 139 mmol/L (ref 135–145)
Total Bilirubin: 0.5 mg/dL (ref 0.3–1.2)
Total Protein: 6.6 g/dL (ref 6.5–8.1)

## 2022-02-07 LAB — FERRITIN: Ferritin: 69 ng/mL (ref 11–307)

## 2022-02-07 NOTE — Progress Notes (Signed)
[place]   Telephone:([phone]) [phone] Fax:(336) [phone]   Clinic Follow up Note   Patient Care Team: Daphene Calamity as PCP - General (Family Medicine) [name], Wonda Cheng, MD as PCP - Cardiology (Cardiology) [name], MD as Consulting Physician (Radiation Oncology) Nicholas Lose, MD as Consulting Physician (Hematology and Oncology) Rolm Bookbinder, MD as Consulting Physician (General Surgery)  Date of Service:  02/07/2022  CHIEF COMPLAINT: f/u of GIST of small bowel, h/o breast cancer  CURRENT THERAPY:  -Imatinib, 400 mg daily, starting 05/07/21 -tamoxifen, starting 08/2018  ASSESSMENT & PLAN:  Anne Horton is a 46 y.o. female with   1. Multifocal GIST of small bowel, pT4N0Mo, stage IIIB, high grade, mitotic rate 9/HPF -initially presented to ED on 02/17/21 with worsening abdominal pain and nausea. CT showed 15.7 cm mass in left mid to lower abdomen, no metastatic disease. MRI showed separation of the mass and the left ovary, and GIST felt to be likely.  -resection on 02/26/21 under Dr. Barry Dienes showed multiple tumor in small bowel, with three small lesions which were unable to be resected. Path confirmed GIST, high grade, margins and lymph nodes were negative. -staging chest CT 05/15/21 was negative. -she began imatinib 400 mg daily on 05/07/21. She is tolerating well without issue. -CT AP on 12/16/21 showed: masslike soft tissue thickening at jejunal anastomosis; short segment small bowel intussusception in left lower quadrant; stable tiny soft tissue nodules in posterolateral left lower quadrant. -she proceeded to small bowel endoscopy on 01/24/22 by Dr. Fuller Plan. The soft tissue thickening was felt to be post-surgical, and biopsies were benign. -she saw Dr. Zenia Resides (Dr. Barry Dienes was out of the office) on 02/01/22 to discuss surgical options. -I reviewed her options with them today.  She is tolerating increased Gleevec dose to 600 mg daily well, will continue.  We  also reviewed option of second line therapy such as Sutent if she has showed disease progression on scan.  Since her recent upper endoscopy was negative, her symptom of bowel obstruction is overall mild, we will watch it for now.  We will plan for a repeat CT scan in the near future to f/u closely. -If she truly developed bowel obstruction, or worsening symptoms, she will need exploratory laparoscopy.   2. NF-1 -We discussed that neurofibromatosis is high risk for GIST  -genetic testing 05/15/21 confirmed pathogenic variant in NF1.   3. Iron deficient anemia -Likely secondary to tumor bleeding from GIST -She previously received IV iron venofer and had vaso-vagal like reaction at the end of second dose -hgb WNL at 12.1 today (02/07/22), ferritin pending -given prior infusion reaction to venofer, I will switch her to [facility], which she tolerated before, if needed   4.  Malignant neoplasm of upper-outer quadrant of left breast, ER+/PR+/HER2-, stage I (pT1cN0) -Oncotype DX score 11 -s/p lumpectomy in 04/2018 and adjuvant radiation -She is on adjuvant tamoxifen, tolerating well. Plan for 10 years  -most recent mammogram 03/21/21 was negative. -[place] in 08/2021 showed she is still premenopausal, we discussed option of switching to AI if she has BSO.  I again reviewed the small risk of endometrial cancer from tamoxifen, and suggested her to watch vaginal bleeding and follow-up with GYN closely.     Plan -continue imatinib 600 mg daily -plan for restaging CT in next 3 weeks, oral contrast given today -lab and f/u in 3-4 weeks -She knows to watch symptom of bowel obstruction, and go to ED if needed.   No problem-specific  Assessment & Plan notes found for this encounter.   SUMMARY OF ONCOLOGIC HISTORY: Oncology History Overview Note  Cancer Staging GIST (gastrointestinal stromal tumor) of small bowel, malignant (HCC) Staging form: Gastrointestinal Stromal Tumor - Small Intestinal, Esophageal,  Colorectal, Mesenteric, and Peritoneal GIST, AJCC 8th Edition - Pathologic stage from 04/03/2021: Stage IIIB (pT4, pN0, cM0, Mitotic Rate: High) - Signed by Truitt Merle, MD on 04/23/2021  Malignant neoplasm of upper-outer quadrant of left breast in female, estrogen receptor positive ([place]) Staging form: Breast, AJCC 8th Edition - Clinical stage from 03/20/2018: Stage IA (cT1c, cN0, cM0, G2, ER+, PR+, HER2-) - Signed by Gardenia Phlegm, NP on 05/27/2018 - Pathologic stage from 05/07/2018: Stage IA (pT1c, pN0, cM0, G1, ER+, PR+, HER2-, Oncotype DX score: 11) - Signed by Gardenia Phlegm, NP on 05/27/2018    Malignant neoplasm of upper-outer quadrant of left breast in female, estrogen receptor positive ([place])  03/20/2018 Initial Diagnosis   Screening detected left breast spiculated mass by ultrasound measured 1.2 cm.  Biopsy revealed invasive ductal carcinoma grade 1-2 with high-grade DCIS, ER 95%, PR 95%, Ki-67 1%, HER-2 negative ratio 1.47, T1CN0 stage I a clinical stage    03/20/2018 Cancer Staging   Staging form: Breast, AJCC 8th Edition - Clinical stage from 03/20/2018: Stage IA (cT1c, cN0, cM0, G2, ER+, PR+, HER2-) - Signed by Gardenia Phlegm, NP on 05/27/2018    04/08/2018 Genetic Testing   NF1  c.2325G>A (Silent) pathogenic variant was identified.  This variant was reclassified from a VUS to pathogenic on June 20, 2021.  The Common Hereditary Cancer Panel offered by [facility] includes sequencing and/or deletion duplication testing of the following 47 genes: APC, ATM, AXIN2, BARD1, BMPR1A, BRCA1, BRCA2, BRIP1, CDH1, CDKN2A (p14ARF), CDKN2A (p16INK4a), CKD4, CHEK2, CTNNA1, DICER1, EPCAM (Deletion/duplication testing only), GREM1 (promoter region deletion/duplication testing only), KIT, MEN1, MLH1, MSH2, MSH3, MSH6, MUTYH, NBN, NF1, NHTL1, PALB2, PDGFRA, PMS2, POLD1, POLE, PTEN, RAD50, RAD51C, RAD51D, SDHB, SDHC, SDHD, SMAD4, SMARCA4. STK11, TP53, TSC1, TSC2, and VHL.  The following genes  were evaluated for sequence changes only: SDHA and HOXB13 c.251G>A variant only.  Results: No pathogenic variants identified.  A Variant of uncertain significance in the gene NF1 was detected c.2325G>A (Silent). The date of this test report is 04/03/2018.    05/07/2018 Surgery   Left lumpectomy: IDC grade 1, 1.2 cm, intermediate grade DCIS, margins negative,PASH, 0/7 sentinel lymph nodes negative, ER 95%, PR 95%, Ki-67 1%, HER-2 negative ratio 1.47, T1CN0 stage Ia    05/07/2018 Cancer Staging   Staging form: Breast, AJCC 8th Edition - Pathologic stage from 05/07/2018: Stage IA (pT1c, pN0, cM0, G1, ER+, PR+, HER2-, Oncotype DX score: 11) - Signed by Gardenia Phlegm, NP on 05/27/2018    05/25/2018 Oncotype testing   Oncotype DX score 11: 3% risk of distant recurrence of 9 years, low risk    06/24/2018 - 08/03/2018 Radiation Therapy   Adjuvant radiation therapy    09/2018 -  Anti-estrogen oral therapy   Tamoxifen daily    GIST (gastrointestinal stromal tumor) of small bowel, malignant ([place])  04/03/2021 Cancer Staging   Staging form: Gastrointestinal Stromal Tumor - Small Intestinal, Esophageal, Colorectal, Mesenteric, and Peritoneal GIST, AJCC 8th Edition - Pathologic stage from 04/03/2021: Stage IIIB (pT4, pN0, cM0, Mitotic Rate: High) - Signed by Truitt Merle, MD on 04/23/2021 Stage prefix: Initial diagnosis Histologic grade (G): High grade Histologic grading system: 2 grade system Residual tumor (R): R0 - None    04/23/2021 Initial Diagnosis  GIST (gastrointestinal stromal tumor) of small bowel, malignant ([place])    05/15/2021 Imaging   CT Chest w/o contrast  IMPRESSION: 1. 6 mm nodular opacity in the posterior right lower lobe with a similar size focus of architectural distortion/scarring in this region on the remote study from 17 years ago, suggesting that this is simply scar although the finding is more confluent on today's study. While almost assuredly benign, consider  follow-up CT chest in 6 months to ensure stability. 2. No evidence for metastatic disease in the chest.   08/21/2021 Imaging   EXAM: CT ABDOMEN AND PELVIS WITH CONTRAST  IMPRESSION: Interval surgical resection of large left abdominal cystic mass since prior exam. No evidence of tumor recurrence or metastatic disease within the abdomen or pelvis.   8 mm low-attenuation lesion in pancreatic head, not definitely seen on prior exams. Consider further characterization with MRI, or continued surveillance on follow-up CT.   Small posterior uterine fibroid.   08/23/2021 Imaging   EXAM: MRI ABDOMEN WITHOUT AND WITH CONTRAST  IMPRESSION: 1. No evidence of pancreatic mass or suspicious correlate for the CT abnormality, which may have been artifactual or possibly related to heterogeneous fatty replacement. 2. No acute process or evidence of metastatic disease in the abdomen. 3.  Aortic Atherosclerosis (ICD10-I70.0).      INTERVAL HISTORY:  Rayya Yagi Sanjose is here for a follow up of GIST. She was last seen by me on 12/10/21. She presents to the clinic accompanied by her daughter. She reports she is still having nausea but no vomiting. She also reports her bowel movements "come and go," and she is using senekot 1-2 times a day.   All other systems were reviewed with the patient and are negative.  MEDICAL HISTORY:  Past Medical History:  Diagnosis Date   Allergy    Anemia    Anxiety    Breast cancer ([place]) 05/07/2018   left invasive ductal    Cancer (HCC)    Chest pain    Depression    Family history of breast cancer    Fibromyalgia    neurofibromatosis   H/O: depression    Hx of migraines    Mitral regurgitation    mild per echo EF 55-60%   Neurofibromatosis ([place])    Palpitations    Personal history of radiation therapy 08/10/2018   left breast 06/24/2018-08/10/2018  Dr [name]   Tachycardia    Hx. of   Tachycardia    Wears contact lenses     SURGICAL  HISTORY: Past Surgical History:  Procedure Laterality Date   BOWEL RESECTION N/A 04/03/2021   Procedure: SMALL BOWEL RESECTION;  Surgeon: [name], MD;  Location: [place];  Service: General;  Laterality: N/A;   BREAST BIOPSY     BREAST LUMPECTOMY Left 05/07/2018   Invasive ductal    BREAST LUMPECTOMY WITH RADIOACTIVE SEED AND SENTINEL LYMPH NODE BIOPSY Left 05/07/2018   Procedure: BREAST LUMPECTOMY WITH RADIOACTIVE SEED AND SENTINEL LYMPH NODE BIOPSY;  Surgeon: Rolm Bookbinder, MD;  Location: [place];  Service: General;  Laterality: Left;   CESAREAN SECTION     CHOLECYSTECTOMY N/A 01/04/2015   Procedure: LAPAROSCOPIC CHOLECYSTECTOMY WITH INTRAOPERATIVE CHOLANGIOGRAM;  Surgeon: Erroll Luna, MD;  Location: [place];  Service: General;  Laterality: N/A;   COLONOSCOPY     ESOPHAGOGASTRODUODENOSCOPY     LAPAROTOMY N/A 04/03/2021   Procedure: EXPLORATORY LAPAROTOMY WITH RESECTION OF ABDOMINAL MASS;  Surgeon: [name], MD;  Location: [place];  Service:  General;  Laterality: N/A;   MASTOPEXY Bilateral 09/05/2020   Procedure: BILATERAL BREAST MASTOPEXY;  Surgeon: Irene Limbo, MD;  Location: [place];  Service: Plastics;  Laterality: Bilateral;   OMENTECTOMY N/A 04/03/2021   Procedure: OMENTECTOMY;  Surgeon: [name], MD;  Location: [place];  Service: General;  Laterality: N/A;   WISDOM TOOTH EXTRACTION      I have reviewed the social history and family history with the patient and they are unchanged from previous note.  ALLERGIES:  is allergic to iron sucrose and latex.  MEDICATIONS:  Current Outpatient Medications  Medication Sig Dispense Refill   ALPRAZolam (XANAX) 0.5 MG tablet Take 1/2 to 1 tablet by mouth 2 times a day as needed 180 tablet 1   ALPRAZolam (XANAX) 0.5 MG tablet Take 1/2 to 1 tablet by mouth 2 times a day as needed 180 tablet 1   CALCIUM PO Take 2 tablets by mouth daily at 6 (six) AM. GUMMY     cetirizine  (ZYRTEC) 10 MG tablet Take 10 mg by mouth daily.     imatinib (GLEEVEC) 100 MG tablet Take 2 tablets (200 mg total) by mouth daily. Take in addition to 429m daily. Take with meals and large glass of water.Caution:Chemotherapy 60 tablet 0   imatinib (GLEEVEC) 400 MG tablet Take 1 tablet (400 mg total) by mouth daily. Take with meals and large glass of water.Caution:Chemotherapy. 30 tablet 3   metoprolol succinate (TOPROL-XL) 50 MG 24 hr tablet Take 1 tablet (50 mg total) by mouth daily. 90 tablet 3   Multiple Vitamin (MULTIVITAMIN) tablet Take 1 tablet by mouth daily.     ondansetron (ZOFRAN) 8 MG tablet Take 1 tablet (8 mg total) by mouth every 8 (eight) hours as needed for nausea or vomiting. 20 tablet 1   propranolol (INDERAL) 10 MG tablet TAKE 1 TABLET BY MOUTH FOUR TIMES DAILY AS NEEDED FOR PALPITATIONS 120 tablet 3   Sennosides-Docusate Sodium (SENNA S PO) Take 1 capsule by mouth daily at 6 (six) AM.     tamoxifen (NOLVADEX) 20 MG tablet TAKE 1 TABLET BY MOUTH ONCE A DAY 90 tablet 3   venlafaxine XR (EFFEXOR-XR) 75 MG 24 hr capsule Take 1 capsule by mouth daily. 90 capsule 1   No current facility-administered medications for this visit.    PHYSICAL EXAMINATION: ECOG PERFORMANCE STATUS: 1 - Symptomatic but completely ambulatory  Vitals:   02/07/22 0900  BP: (!) 121/51  Pulse: 97  Resp: 18  Temp: 99 F (37.2 C)  SpO2: 100%   Wt Readings from Last 3 Encounters:  02/07/22 131 lb 8 oz (59.6 kg)  01/24/22 130 lb (59 kg)  01/10/22 130 lb (59 kg)     GENERAL:alert, no distress and comfortable SKIN: skin color, texture, turgor are normal, no rashes or significant lesions EYES: normal, Conjunctiva are pink and non-injected, sclera clear  NECK: supple, thyroid normal size, non-tender, without nodularity LYMPH:  no palpable lymphadenopathy in the cervical, axillary LUNGS: clear to auscultation and percussion with normal breathing effort HEART: regular rate & rhythm and no murmurs and  no lower extremity edema ABDOMEN:abdomen soft and normal bowel sounds, (+) mild tenderness Musculoskeletal:no cyanosis of digits and no clubbing  NEURO: alert & oriented x 3 with fluent speech, no focal motor/sensory deficits  LABORATORY DATA:  I have reviewed the data as listed    Latest Ref Rng & Units 02/07/2022    8:44 AM 12/10/2021    8:12 AM 09/26/2021  8:14 AM  CBC  WBC 4.0 - 10.5 K/uL 5.4   6.9   7.2    Hemoglobin 12.0 - 15.0 g/dL 12.1   12.2   12.3    Hematocrit 36.0 - 46.0 % 35.4   35.8   35.8    Platelets 150 - 400 K/uL 351   339   381          Latest Ref Rng & Units 02/07/2022    8:44 AM 12/10/2021    8:12 AM 09/26/2021    8:14 AM  CMP  Glucose 70 - 99 mg/dL 94   101   103    BUN 6 - 20 mg/dL _0 Creatinine 0.44 - 1.00 mg/dL 0.79   0.80   0.73    Sodium 135 - 145 mmol/L 139   142   139    Potassium 3.5 - 5.1 mmol/L 3.6   3.6   4.1    Chloride 98 - 111 mmol/L 107   110   108    CO2 22 - 32 mmol/L _1 Calcium 8.9 - 10.3 mg/dL 8.6   8.6   8.6    Total Protein 6.5 - 8.1 g/dL 6.6   6.7   6.7    Total Bilirubin 0.3 - 1.2 mg/dL 0.5   0.5   0.4    Alkaline Phos 38 - 126 U/L 46   47   49    AST 15 - 41 U/L _2 ALT 0 - 44 U/L _3 RADIOGRAPHIC STUDIES: I have personally reviewed the radiological images as listed and agreed with the findings in the report. No results found.    No orders of the defined types were placed in this encounter.  All questions were answered. The patient knows to call the clinic with any problems, questions or concerns. No barriers to learning was detected. The total time spent in the appointment was 30 minutes.     Truitt Merle, MD 02/07/2022   I, Wilburn Mylar, am acting as scribe for Truitt Merle, MD.   I have reviewed the above documentation for accuracy and completeness, and I agree with the above.

## 2022-02-08 ENCOUNTER — Other Ambulatory Visit (HOSPITAL_COMMUNITY): Payer: Self-pay

## 2022-02-12 ENCOUNTER — Other Ambulatory Visit (HOSPITAL_COMMUNITY): Payer: Self-pay

## 2022-02-12 ENCOUNTER — Other Ambulatory Visit: Payer: Self-pay | Admitting: Hematology

## 2022-02-12 MED ORDER — IMATINIB MESYLATE 100 MG PO TABS
200.0000 mg | ORAL_TABLET | Freq: Every day | ORAL | 0 refills | Status: DC
  Filled 2022-02-14: qty 60, 30d supply, fill #0

## 2022-02-14 ENCOUNTER — Other Ambulatory Visit (HOSPITAL_COMMUNITY): Payer: Self-pay

## 2022-02-19 DIAGNOSIS — C49A3 Gastrointestinal stromal tumor of small intestine: Secondary | ICD-10-CM

## 2022-02-20 ENCOUNTER — Other Ambulatory Visit (HOSPITAL_COMMUNITY): Payer: Self-pay

## 2022-03-04 DIAGNOSIS — C49A3 Gastrointestinal stromal tumor of small intestine: Secondary | ICD-10-CM

## 2022-03-05 ENCOUNTER — Encounter (HOSPITAL_COMMUNITY): Payer: Self-pay

## 2022-03-05 ENCOUNTER — Ambulatory Visit (HOSPITAL_COMMUNITY)
Admission: RE | Admit: 2022-03-05 | Discharge: 2022-03-05 | Disposition: A | Discharge status: Home / Self Care | Payer: 59 | Source: Ambulatory Visit | Attending: Hematology | Admitting: Hematology

## 2022-03-05 DIAGNOSIS — C49A3 Gastrointestinal stromal tumor of small intestine: Secondary | ICD-10-CM | POA: Insufficient documentation

## 2022-03-05 DIAGNOSIS — N83201 Unspecified ovarian cyst, right side: Secondary | ICD-10-CM | POA: Diagnosis not present

## 2022-03-05 MED ORDER — IOHEXOL 300 MG/ML  SOLN
100.0000 mL | Freq: Once | INTRAMUSCULAR | Status: AC | PRN
  Administered 2022-03-05: 100 mL via INTRAVENOUS

## 2022-03-08 ENCOUNTER — Telehealth: Payer: Self-pay

## 2022-03-08 ENCOUNTER — Inpatient Hospital Stay: Payer: 59

## 2022-03-08 ENCOUNTER — Inpatient Hospital Stay: Payer: 59 | Admitting: Hematology

## 2022-03-08 NOTE — Telephone Encounter (Signed)
This nurse received a call from Receptionist stating that called and stated she would need to cancel her appointments today because she is not feeling well.  Appointments cancelled and MD made aware.

## 2022-03-12 ENCOUNTER — Other Ambulatory Visit (HOSPITAL_COMMUNITY): Payer: Self-pay

## 2022-03-18 ENCOUNTER — Other Ambulatory Visit (HOSPITAL_COMMUNITY): Payer: Self-pay

## 2022-04-04 ENCOUNTER — Other Ambulatory Visit: Payer: Self-pay | Admitting: Family Medicine

## 2022-04-04 DIAGNOSIS — Z9889 Other specified postprocedural states: Secondary | ICD-10-CM

## 2022-04-09 ENCOUNTER — Other Ambulatory Visit (HOSPITAL_COMMUNITY): Payer: Self-pay

## 2022-04-09 ENCOUNTER — Other Ambulatory Visit: Payer: Self-pay

## 2022-04-09 ENCOUNTER — Inpatient Hospital Stay: Payer: 59 | Attending: Radiation Oncology | Admitting: Hematology

## 2022-04-09 ENCOUNTER — Inpatient Hospital Stay: Payer: 59

## 2022-04-09 VITALS — BP 112/51 | HR 78 | Temp 98.9°F | Resp 17 | Ht 62.0 in | Wt 130.1 lb

## 2022-04-09 DIAGNOSIS — D509 Iron deficiency anemia, unspecified: Secondary | ICD-10-CM | POA: Insufficient documentation

## 2022-04-09 DIAGNOSIS — Z79810 Long term (current) use of selective estrogen receptor modulators (SERMs): Secondary | ICD-10-CM | POA: Insufficient documentation

## 2022-04-09 DIAGNOSIS — Z923 Personal history of irradiation: Secondary | ICD-10-CM | POA: Insufficient documentation

## 2022-04-09 DIAGNOSIS — C49A3 Gastrointestinal stromal tumor of small intestine: Secondary | ICD-10-CM | POA: Insufficient documentation

## 2022-04-09 DIAGNOSIS — C50412 Malignant neoplasm of upper-outer quadrant of left female breast: Secondary | ICD-10-CM | POA: Insufficient documentation

## 2022-04-09 DIAGNOSIS — Z9049 Acquired absence of other specified parts of digestive tract: Secondary | ICD-10-CM | POA: Diagnosis not present

## 2022-04-09 DIAGNOSIS — F32A Depression, unspecified: Secondary | ICD-10-CM | POA: Insufficient documentation

## 2022-04-09 DIAGNOSIS — I700 Atherosclerosis of aorta: Secondary | ICD-10-CM | POA: Insufficient documentation

## 2022-04-09 DIAGNOSIS — K561 Intussusception: Secondary | ICD-10-CM | POA: Insufficient documentation

## 2022-04-09 DIAGNOSIS — Z170 Estrogen receptor positive status [ER+]: Secondary | ICD-10-CM | POA: Insufficient documentation

## 2022-04-09 DIAGNOSIS — R918 Other nonspecific abnormal finding of lung field: Secondary | ICD-10-CM | POA: Insufficient documentation

## 2022-04-09 DIAGNOSIS — G4700 Insomnia, unspecified: Secondary | ICD-10-CM | POA: Insufficient documentation

## 2022-04-09 DIAGNOSIS — F419 Anxiety disorder, unspecified: Secondary | ICD-10-CM | POA: Insufficient documentation

## 2022-04-09 DIAGNOSIS — R232 Flushing: Secondary | ICD-10-CM | POA: Insufficient documentation

## 2022-04-09 DIAGNOSIS — Z803 Family history of malignant neoplasm of breast: Secondary | ICD-10-CM | POA: Diagnosis not present

## 2022-04-09 DIAGNOSIS — D500 Iron deficiency anemia secondary to blood loss (chronic): Secondary | ICD-10-CM

## 2022-04-09 DIAGNOSIS — Z79899 Other long term (current) drug therapy: Secondary | ICD-10-CM | POA: Insufficient documentation

## 2022-04-09 LAB — CBC WITH DIFFERENTIAL (CANCER CENTER ONLY)
Abs Immature Granulocytes: 0.03 10*3/uL (ref 0.00–0.07)
Basophils Absolute: 0.1 10*3/uL (ref 0.0–0.1)
Basophils Relative: 2 %
Eosinophils Absolute: 0.4 10*3/uL (ref 0.0–0.5)
Eosinophils Relative: 6 %
HCT: 34.6 % — ABNORMAL LOW (ref 36.0–46.0)
Hemoglobin: 11.9 g/dL — ABNORMAL LOW (ref 12.0–15.0)
Immature Granulocytes: 1 %
Lymphocytes Relative: 26 %
Lymphs Abs: 1.5 10*3/uL (ref 0.7–4.0)
MCH: 32.9 pg (ref 26.0–34.0)
MCHC: 34.4 g/dL (ref 30.0–36.0)
MCV: 95.6 fL (ref 80.0–100.0)
Monocytes Absolute: 0.5 10*3/uL (ref 0.1–1.0)
Monocytes Relative: 9 %
Neutro Abs: 3.4 10*3/uL (ref 1.7–7.7)
Neutrophils Relative %: 56 %
Platelet Count: 328 10*3/uL (ref 150–400)
RBC: 3.62 MIL/uL — ABNORMAL LOW (ref 3.87–5.11)
RDW: 12.4 % (ref 11.5–15.5)
WBC Count: 5.9 10*3/uL (ref 4.0–10.5)
nRBC: 0 % (ref 0.0–0.2)

## 2022-04-09 LAB — CMP (CANCER CENTER ONLY)
ALT: 20 U/L (ref 0–44)
AST: 21 U/L (ref 15–41)
Albumin: 4.2 g/dL (ref 3.5–5.0)
Alkaline Phosphatase: 48 U/L (ref 38–126)
Anion gap: 5 (ref 5–15)
BUN: 13 mg/dL (ref 6–20)
CO2: 26 mmol/L (ref 22–32)
Calcium: 8.4 mg/dL — ABNORMAL LOW (ref 8.9–10.3)
Chloride: 109 mmol/L (ref 98–111)
Creatinine: 0.71 mg/dL (ref 0.44–1.00)
GFR, Estimated: >60 mL/min (ref >60)
Glucose, Bld: 91 mg/dL (ref 70–99)
Potassium: 3.6 mmol/L (ref 3.5–5.1)
Sodium: 140 mmol/L (ref 135–145)
Total Bilirubin: 0.5 mg/dL (ref 0.3–1.2)
Total Protein: 6.3 g/dL — ABNORMAL LOW (ref 6.5–8.1)

## 2022-04-09 LAB — FERRITIN: Ferritin: 56 ng/mL (ref 11–307)

## 2022-04-09 NOTE — Progress Notes (Signed)
[name]   [phone] Fax:([phone]) [phone]   Clinic Follow up Note   Patient Care Team: Daphene Calamity as PCP - General (Family Medicine) [name], Wonda Cheng, MD as PCP - Cardiology (Cardiology) [name], MD as Consulting Physician (Radiation Oncology) Nicholas Lose, MD as Consulting Physician (Hematology and Oncology) Rolm Bookbinder, MD as Consulting Physician (General Surgery) Truitt Merle, MD as Consulting Physician (Oncology)  Date of Service:  04/09/2022  CHIEF COMPLAINT: f/u of GIST of small bowel, h/o breast cancer  CURRENT THERAPY:  -Imatinib, 400 mg daily, starting 05/07/21 -tamoxifen, starting 08/2018  ASSESSMENT & PLAN:  Anne Horton is a 46 y.o. female with   1. Multifocal GIST of small bowel, pT4N0Mo, stage IIIB, high grade, mitotic rate 9/HPF -initially presented to ED on 02/17/21 with worsening abdominal pain and nausea. CT showed 15.7 cm mass in left mid to lower abdomen, no metastatic disease. MRI showed separation of the mass and the left ovary, and GIST felt to be likely.  -resection on 02/26/21 under Dr. Barry Dienes showed multiple tumor in small bowel, with three small lesions which were unable to be resected. Path confirmed GIST, high grade, margins and lymph nodes were negative. -staging chest CT 05/15/21 was negative. -she began imatinib 400 mg daily on 05/07/21. She is tolerating well without issue. -CT AP on 12/16/21 showed: masslike soft tissue thickening at jejunal anastomosis; short segment small bowel intussusception in left lower quadrant; stable tiny soft tissue nodules in posterolateral left lower quadrant. -she proceeded to small bowel endoscopy on 01/24/22 by Dr. Fuller Plan. The soft tissue thickening was felt to be post-surgical, and biopsies were benign. -she saw Dr. Zenia Resides (Dr. Barry Dienes was out of the office) on 02/01/22 to discuss surgical options. She opted to hold surgery and her abdominal pain subsequently  resolved. -Her restaging CT scan from June 2023 showed no evidence of recurrence, resolved small bowel intussusception, new enlarged right ovary cyst, measuring 3.7 cm, likely benign HTN and premenopausal status.  I reviewed with her. -She is clinically doing well, tolerating Gleevec well, will continue, for 3 months. -Lab and follow-up in 4 months, plan to repeat CT scan in December  2. NF-1 -We discussed that neurofibromatosis is high risk for GIST  -genetic testing 05/15/21 confirmed pathogenic variant in NF1.   3. Iron deficient anemia -Likely secondary to tumor bleeding from GIST -She previously received IV iron venofer and had vaso-vagal like reaction at the end of second dose -hgb WNL at 12.1 today (02/07/22), ferritin pending -given prior infusion reaction to venofer, I will switch her to [facility], which she tolerated before, if needed   4.  Malignant neoplasm of upper-outer quadrant of left breast, ER+/PR+/HER2-, stage I (pT1cN0) -Oncotype DX score 11 -s/p lumpectomy in 04/2018 and adjuvant radiation -She is on adjuvant tamoxifen, tolerating well. Plan for 10 years  -most recent mammogram 03/21/21 was negative. -[place] in 08/2021 showed she is still premenopausal, we discussed option of switching to AI if she has BSO.  I again reviewed the small risk of endometrial cancer from tamoxifen, and suggested her to watch vaginal bleeding and follow-up with GYN closely. -She has moderate hot flashes, she is on Effexor, which she would like to reduce the dose.  She still has gabapentin at home, I encouraged her to try that for hot flashes and related insomnia.     Plan -continue imatinib 400 mg daily -She will see Dr. Barry Dienes in a month -Lab and follow-up in 4 months.  We will repeat CT scan in December 2023.   No problem-specific Assessment & Plan notes found for this encounter.   SUMMARY OF ONCOLOGIC HISTORY: Oncology History Overview Note  Cancer Staging GIST (gastrointestinal stromal  tumor) of small bowel, malignant (HCC) Staging form: Gastrointestinal Stromal Tumor - Small Intestinal, Esophageal, Colorectal, Mesenteric, and Peritoneal GIST, AJCC 8th Edition - Pathologic stage from 04/03/2021: Stage IIIB (pT4, pN0, cM0, Mitotic Rate: High) - Signed by Truitt Merle, MD on 04/23/2021  Malignant neoplasm of upper-outer quadrant of left breast in female, estrogen receptor positive ([place]) Staging form: Breast, AJCC 8th Edition - Clinical stage from 03/20/2018: Stage IA (cT1c, cN0, cM0, G2, ER+, PR+, HER2-) - Signed by Gardenia Phlegm, NP on 05/27/2018 - Pathologic stage from 05/07/2018: Stage IA (pT1c, pN0, cM0, G1, ER+, PR+, HER2-, Oncotype DX score: 11) - Signed by Gardenia Phlegm, NP on 05/27/2018    Malignant neoplasm of upper-outer quadrant of left breast in female, estrogen receptor positive ([place])  03/20/2018 Initial Diagnosis   Screening detected left breast spiculated mass by ultrasound measured 1.2 cm.  Biopsy revealed invasive ductal carcinoma grade 1-2 with high-grade DCIS, ER 95%, PR 95%, Ki-67 1%, HER-2 negative ratio 1.47, T1CN0 stage I a clinical stage   03/20/2018 Cancer Staging   Staging form: Breast, AJCC 8th Edition - Clinical stage from 03/20/2018: Stage IA (cT1c, cN0, cM0, G2, ER+, PR+, HER2-) - Signed by Gardenia Phlegm, NP on 05/27/2018   04/08/2018 Genetic Testing   NF1  c.2325G>A (Silent) pathogenic variant was identified.  This variant was reclassified from a VUS to pathogenic on June 20, 2021.  The Common Hereditary Cancer Panel offered by [facility] includes sequencing and/or deletion duplication testing of the following 47 genes: APC, ATM, AXIN2, BARD1, BMPR1A, BRCA1, BRCA2, BRIP1, CDH1, CDKN2A (p14ARF), CDKN2A (p16INK4a), CKD4, CHEK2, CTNNA1, DICER1, EPCAM (Deletion/duplication testing only), GREM1 (promoter region deletion/duplication testing only), KIT, MEN1, MLH1, MSH2, MSH3, MSH6, MUTYH, NBN, NF1, NHTL1, PALB2, PDGFRA, PMS2, POLD1, POLE,  PTEN, RAD50, RAD51C, RAD51D, SDHB, SDHC, SDHD, SMAD4, SMARCA4. STK11, TP53, TSC1, TSC2, and VHL.  The following genes were evaluated for sequence changes only: SDHA and HOXB13 c.251G>A variant only.  Results: No pathogenic variants identified.  A Variant of uncertain significance in the gene NF1 was detected c.2325G>A (Silent). The date of this test report is 04/03/2018.    05/07/2018 Surgery   Left lumpectomy: IDC grade 1, 1.2 cm, intermediate grade DCIS, margins negative,PASH, 0/7 sentinel lymph nodes negative, ER 95%, PR 95%, Ki-67 1%, HER-2 negative ratio 1.47, T1CN0 stage Ia   05/07/2018 Cancer Staging   Staging form: Breast, AJCC 8th Edition - Pathologic stage from 05/07/2018: Stage IA (pT1c, pN0, cM0, G1, ER+, PR+, HER2-, Oncotype DX score: 11) - Signed by Gardenia Phlegm, NP on 05/27/2018   05/25/2018 Oncotype testing   Oncotype DX score 11: 3% risk of distant recurrence of 9 years, low risk   06/24/2018 - 08/03/2018 Radiation Therapy   Adjuvant radiation therapy   09/2018 -  Anti-estrogen oral therapy   Tamoxifen daily   GIST (gastrointestinal stromal tumor) of small bowel, malignant ([place])  04/03/2021 Cancer Staging   Staging form: Gastrointestinal Stromal Tumor - Small Intestinal, Esophageal, Colorectal, Mesenteric, and Peritoneal GIST, AJCC 8th Edition - Pathologic stage from 04/03/2021: Stage IIIB (pT4, pN0, cM0, Mitotic Rate: High) - Signed by Truitt Merle, MD on 04/23/2021 Stage prefix: Initial diagnosis Histologic grade (G): High grade Histologic grading system: 2 grade system Residual tumor (R): R0 - None  04/23/2021 Initial Diagnosis   GIST (gastrointestinal stromal tumor) of small bowel, malignant ([place])   05/15/2021 Imaging   CT Chest w/o contrast  IMPRESSION: 1. 6 mm nodular opacity in the posterior right lower lobe with a similar size focus of architectural distortion/scarring in this region on the remote study from 17 years ago, suggesting that this is simply scar  although the finding is more confluent on today's study. While almost assuredly benign, consider follow-up CT chest in 6 months to ensure stability. 2. No evidence for metastatic disease in the chest.   08/21/2021 Imaging   EXAM: CT ABDOMEN AND PELVIS WITH CONTRAST  IMPRESSION: Interval surgical resection of large left abdominal cystic mass since prior exam. No evidence of tumor recurrence or metastatic disease within the abdomen or pelvis.   8 mm low-attenuation lesion in pancreatic head, not definitely seen on prior exams. Consider further characterization with MRI, or continued surveillance on follow-up CT.   Small posterior uterine fibroid.   08/23/2021 Imaging   EXAM: MRI ABDOMEN WITHOUT AND WITH CONTRAST  IMPRESSION: 1. No evidence of pancreatic mass or suspicious correlate for the CT abnormality, which may have been artifactual or possibly related to heterogeneous fatty replacement. 2. No acute process or evidence of metastatic disease in the abdomen. 3.  Aortic Atherosclerosis (ICD10-I70.0).      INTERVAL HISTORY:  Anne Horton is here for a follow up of GIST. She was last seen by me on 12/10/21. She presents to the clinic alone.  She is clinically doing well, denies any episodes of abdominal pain and headache.  Her bowel movements normal, no other noticeable side effects from [place].  She is also tolerating tamoxifen well, with moderate hot flashes, and mild insomnia.  She has not had menstrual period for 2 years, she follows her gynecologist regularly.  No other new complaints.   All other systems were reviewed with the patient and are negative.  MEDICAL HISTORY:  Past Medical History:  Diagnosis Date   Allergy    Anemia    Anxiety    Breast cancer ([place]) 05/07/2018   left invasive ductal    Cancer (HCC)    Chest pain    Depression    Family history of breast cancer    Fibromyalgia    neurofibromatosis   H/O: depression    Hx of migraines    Mitral  regurgitation    mild per echo EF 55-60%   Neurofibromatosis ([place])    Palpitations    Personal history of radiation therapy 08/10/2018   left breast 06/24/2018-08/10/2018  Dr [name]   Tachycardia    Hx. of   Tachycardia    Wears contact lenses     SURGICAL HISTORY: Past Surgical History:  Procedure Laterality Date   BOWEL RESECTION N/A 04/03/2021   Procedure: SMALL BOWEL RESECTION;  Surgeon: [name], MD;  Location: [facility];  Service: General;  Laterality: N/A;   BREAST BIOPSY     BREAST LUMPECTOMY Left 05/07/2018   Invasive ductal    BREAST LUMPECTOMY WITH RADIOACTIVE SEED AND SENTINEL LYMPH NODE BIOPSY Left 05/07/2018   Procedure: BREAST LUMPECTOMY WITH RADIOACTIVE SEED AND SENTINEL LYMPH NODE BIOPSY;  Surgeon: Rolm Bookbinder, MD;  Location: [facility];  Service: General;  Laterality: Left;   CESAREAN SECTION     CHOLECYSTECTOMY N/A 01/04/2015   Procedure: LAPAROSCOPIC CHOLECYSTECTOMY WITH INTRAOPERATIVE CHOLANGIOGRAM;  Surgeon: Erroll Luna, MD;  Location: [facility];  Service: General;  Laterality: N/A;   COLONOSCOPY  ESOPHAGOGASTRODUODENOSCOPY     LAPAROTOMY N/A 04/03/2021   Procedure: EXPLORATORY LAPAROTOMY WITH RESECTION OF ABDOMINAL MASS;  Surgeon: [name], MD;  Location: [facility];  Service: General;  Laterality: N/A;   MASTOPEXY Bilateral 09/05/2020   Procedure: BILATERAL BREAST MASTOPEXY;  Surgeon: Irene Limbo, MD;  Location: [facility];  Service: Plastics;  Laterality: Bilateral;   OMENTECTOMY N/A 04/03/2021   Procedure: OMENTECTOMY;  Surgeon: [name], MD;  Location: [facility];  Service: General;  Laterality: N/A;   WISDOM TOOTH EXTRACTION      I have reviewed the social history and family history with the patient and they are unchanged from previous note.  ALLERGIES:  is allergic to iron sucrose and latex.  MEDICATIONS:  Current Outpatient Medications  Medication Sig Dispense Refill   ALPRAZolam  (XANAX) 0.5 MG tablet Take 1/2 to 1 tablet by mouth 2 times a day as needed 180 tablet 1   ALPRAZolam (XANAX) 0.5 MG tablet Take 1/2 to 1 tablet by mouth 2 times a day as needed 180 tablet 1   CALCIUM PO Take 2 tablets by mouth daily at 6 (six) AM. GUMMY     cetirizine (ZYRTEC) 10 MG tablet Take 10 mg by mouth daily.     imatinib (GLEEVEC) 400 MG tablet Take 1 tablet (400 mg total) by mouth daily. Take with meals and large glass of water.Caution:Chemotherapy. 30 tablet 3   metoprolol succinate (TOPROL-XL) 50 MG 24 hr tablet Take 1 tablet (50 mg total) by mouth daily. 90 tablet 3   Multiple Vitamin (MULTIVITAMIN) tablet Take 1 tablet by mouth daily.     ondansetron (ZOFRAN) 8 MG tablet Take 1 tablet (8 mg total) by mouth every 8 (eight) hours as needed for nausea or vomiting. 20 tablet 1   propranolol (INDERAL) 10 MG tablet TAKE 1 TABLET BY MOUTH FOUR TIMES DAILY AS NEEDED FOR PALPITATIONS 120 tablet 3   Sennosides-Docusate Sodium (SENNA S PO) Take 1 capsule by mouth daily at 6 (six) AM.     tamoxifen (NOLVADEX) 20 MG tablet TAKE 1 TABLET BY MOUTH ONCE A DAY 90 tablet 3   venlafaxine XR (EFFEXOR-XR) 75 MG 24 hr capsule Take 1 capsule by mouth daily. 90 capsule 1   No current facility-administered medications for this visit.    PHYSICAL EXAMINATION: ECOG PERFORMANCE STATUS: 1 - Symptomatic but completely ambulatory  Vitals:   04/09/22 0819  BP: (!) 112/51  Pulse: 78  Resp: 17  Temp: 98.9 F (37.2 C)  SpO2: 100%   Wt Readings from Last 3 Encounters:  04/09/22 130 lb 1.6 oz (59 kg)  02/07/22 131 lb 8 oz (59.6 kg)  01/24/22 130 lb (59 kg)     GENERAL:alert, no distress and comfortable SKIN: skin color, texture, turgor are normal, no rashes or significant lesions EYES: normal, Conjunctiva are pink and non-injected, sclera clear  NECK: supple, thyroid normal size, non-tender, without nodularity LYMPH:  no palpable lymphadenopathy in the cervical, axillary LUNGS: clear to auscultation  and percussion with normal breathing effort HEART: regular rate & rhythm and no murmurs and no lower extremity edema ABDOMEN:abdomen soft and normal bowel sounds, (+) mild tenderness Musculoskeletal:no cyanosis of digits and no clubbing  NEURO: alert & oriented x 3 with fluent speech, no focal motor/sensory deficits  LABORATORY DATA:  I have reviewed the data as listed    Latest Ref Rng & Units 04/09/2022    8:05 AM 02/07/2022    8:44 AM 12/10/2021    8:12 AM  CBC  WBC 4.0 - 10.5 K/uL 5.9  5.4  6.9   Hemoglobin 12.0 - 15.0 g/dL 11.9  12.1  12.2   Hematocrit 36.0 - 46.0 % 34.6  35.4  35.8   Platelets 150 - 400 K/uL 328  351  339         Latest Ref Rng & Units 04/09/2022    8:05 AM 02/07/2022    8:44 AM 12/10/2021    8:12 AM  CMP  Glucose 70 - 99 mg/dL 91  94  101   BUN 6 - 20 mg/dL _0 Creatinine 0.44 - 1.00 mg/dL 0.71  0.79  0.80   Sodium 135 - 145 mmol/L 140  139  142   Potassium 3.5 - 5.1 mmol/L 3.6  3.6  3.6   Chloride 98 - 111 mmol/L 109  107  110   CO2 22 - 32 mmol/L _1 Calcium 8.9 - 10.3 mg/dL 8.4  8.6  8.6   Total Protein 6.5 - 8.1 g/dL 6.3  6.6  6.7   Total Bilirubin 0.3 - 1.2 mg/dL 0.5  0.5  0.5   Alkaline Phos 38 - 126 U/L 48  46  47   AST 15 - 41 U/L _2 ALT 0 - 44 U/L _3 RADIOGRAPHIC STUDIES: I have personally reviewed the radiological images as listed and agreed with the findings in the report. No results found.    No orders of the defined types were placed in this encounter.  All questions were answered. The patient knows to call the clinic with any problems, questions or concerns. No barriers to learning was detected. The total time spent in the appointment was 30 minutes.     Truitt Merle, MD 04/09/2022

## 2022-04-11 ENCOUNTER — Telehealth: Payer: Self-pay | Admitting: Hematology

## 2022-04-11 NOTE — Telephone Encounter (Signed)
Scheduled follow-up appointment per 7/25 los. Patient is aware.

## 2022-04-12 ENCOUNTER — Other Ambulatory Visit (HOSPITAL_COMMUNITY): Payer: Self-pay

## 2022-04-15 ENCOUNTER — Other Ambulatory Visit (HOSPITAL_COMMUNITY): Payer: Self-pay

## 2022-04-16 ENCOUNTER — Other Ambulatory Visit (HOSPITAL_COMMUNITY): Payer: Self-pay

## 2022-04-17 ENCOUNTER — Ambulatory Visit
Admission: RE | Admit: 2022-04-17 | Discharge: 2022-04-17 | Disposition: A | Discharge status: Home / Self Care | Payer: 59 | Source: Ambulatory Visit | Attending: Family Medicine | Admitting: Family Medicine

## 2022-04-17 DIAGNOSIS — R922 Inconclusive mammogram: Secondary | ICD-10-CM | POA: Diagnosis not present

## 2022-04-17 DIAGNOSIS — Z9889 Other specified postprocedural states: Secondary | ICD-10-CM

## 2022-04-17 DIAGNOSIS — Z853 Personal history of malignant neoplasm of breast: Secondary | ICD-10-CM | POA: Diagnosis not present

## 2022-05-03 ENCOUNTER — Other Ambulatory Visit (HOSPITAL_COMMUNITY): Payer: Self-pay

## 2022-05-06 ENCOUNTER — Other Ambulatory Visit (HOSPITAL_COMMUNITY): Payer: Self-pay

## 2022-05-08 DIAGNOSIS — Z170 Estrogen receptor positive status [ER+]: Secondary | ICD-10-CM | POA: Diagnosis not present

## 2022-05-08 DIAGNOSIS — C50412 Malignant neoplasm of upper-outer quadrant of left female breast: Secondary | ICD-10-CM | POA: Diagnosis not present

## 2022-05-08 DIAGNOSIS — C49A3 Gastrointestinal stromal tumor of small intestine: Secondary | ICD-10-CM | POA: Diagnosis not present

## 2022-05-13 ENCOUNTER — Other Ambulatory Visit (HOSPITAL_COMMUNITY): Payer: Self-pay

## 2022-05-22 ENCOUNTER — Other Ambulatory Visit (HOSPITAL_COMMUNITY): Payer: Self-pay

## 2022-05-22 ENCOUNTER — Other Ambulatory Visit: Payer: Self-pay | Admitting: Hematology

## 2022-05-22 MED ORDER — IMATINIB MESYLATE 400 MG PO TABS
400.0000 mg | ORAL_TABLET | Freq: Every day | ORAL | 3 refills | Status: DC
  Filled 2022-05-22: qty 30, 30d supply, fill #0
  Filled 2022-06-18: qty 30, 30d supply, fill #1
  Filled 2022-07-19: qty 30, 30d supply, fill #2
  Filled 2022-08-23 – 2022-08-30 (×2): qty 30, 30d supply, fill #3

## 2022-05-24 ENCOUNTER — Other Ambulatory Visit (HOSPITAL_COMMUNITY): Payer: Self-pay

## 2022-06-18 ENCOUNTER — Other Ambulatory Visit (HOSPITAL_COMMUNITY): Payer: Self-pay

## 2022-06-21 ENCOUNTER — Other Ambulatory Visit (HOSPITAL_COMMUNITY): Payer: Self-pay

## 2022-07-03 ENCOUNTER — Other Ambulatory Visit (HOSPITAL_COMMUNITY): Payer: Self-pay

## 2022-07-04 ENCOUNTER — Other Ambulatory Visit (HOSPITAL_COMMUNITY): Payer: Self-pay

## 2022-07-16 ENCOUNTER — Other Ambulatory Visit (HOSPITAL_COMMUNITY): Payer: Self-pay

## 2022-07-19 ENCOUNTER — Other Ambulatory Visit (HOSPITAL_COMMUNITY): Payer: Self-pay

## 2022-07-26 ENCOUNTER — Encounter: Payer: Self-pay | Admitting: Hematology

## 2022-07-29 ENCOUNTER — Other Ambulatory Visit (HOSPITAL_COMMUNITY): Payer: Self-pay

## 2022-08-07 ENCOUNTER — Other Ambulatory Visit (HOSPITAL_COMMUNITY): Payer: Self-pay

## 2022-08-07 DIAGNOSIS — F330 Major depressive disorder, recurrent, mild: Secondary | ICD-10-CM | POA: Diagnosis not present

## 2022-08-07 DIAGNOSIS — F411 Generalized anxiety disorder: Secondary | ICD-10-CM | POA: Diagnosis not present

## 2022-08-07 MED ORDER — VENLAFAXINE HCL ER 37.5 MG PO CP24
37.5000 mg | ORAL_CAPSULE | Freq: Every day | ORAL | 1 refills | Status: DC
  Filled 2022-08-07: qty 90, 90d supply, fill #0

## 2022-08-14 ENCOUNTER — Other Ambulatory Visit: Payer: Self-pay | Admitting: Medical Oncology

## 2022-08-14 DIAGNOSIS — C49A3 Gastrointestinal stromal tumor of small intestine: Secondary | ICD-10-CM

## 2022-08-14 DIAGNOSIS — C50412 Malignant neoplasm of upper-outer quadrant of left female breast: Secondary | ICD-10-CM

## 2022-08-14 NOTE — Progress Notes (Unsigned)
[facility]   [phone] [phone]   Clinic Follow up Note   Patient Care Team: Daphene Calamity as PCP - General (Family Medicine) [name], Wonda Cheng, MD as PCP - Cardiology (Cardiology) [name], MD as Consulting Physician (Radiation Oncology) Nicholas Lose, MD as Consulting Physician (Hematology and Oncology) Rolm Bookbinder, MD as Consulting Physician (General Surgery) Truitt Merle, MD as Consulting Physician (Oncology)  Date of Service:  08/15/2022  CHIEF COMPLAINT: f/u of GIST of small bowel, h/o breast cancer    CURRENT THERAPY:  -Imatinib, 400 mg daily, starting 05/07/21 -tamoxifen, starting 08/2018    ASSESSMENT:  Anne Horton is a 46 y.o. female with    1. Multifocal GIST of small bowel, pT4N0Mo, stage IIIB, high grade, mitotic rate 9/HPF -Diagnosed in 02/2022 -resection on 02/26/21 under Dr. Barry Dienes showed multiple tumor in small bowel, with three small lesions which were unable to be resected. Path confirmed GIST, high grade, margins and lymph nodes were negative. -she began imatinib 400 mg daily on 05/07/21. She is tolerating well without issue. -repeated CT have not sure evidence of recurrence  -She is clinically doing well, tolerating Gleevec well, will continue, for 3 years  -Lab and follow-up in  3 months  -repeat CT next month    2. NF-1 -We discussed that neurofibromatosis is high risk for GIST  -genetic testing 05/15/21 confirmed pathogenic variant in NF1.   3. Iron deficient anemia -Likely secondary to tumor bleeding from GIST -She previously received IV iron venofer and had vaso-vagal like reaction at the end of second dose -hgb WNL at 12.1 today (02/07/22), ferritin pending -given prior infusion reaction to venofer, I will switch her to [facility], which she tolerated before, if needed   4.  Malignant neoplasm of upper-outer quadrant of left breast, ER+/PR+/HER2-, stage I (pT1cN0) -Oncotype DX score  11 -s/p lumpectomy in 04/2018 and adjuvant radiation -She is on adjuvant tamoxifen, tolerating well. Plan for 10 years  -most recent mammogram 03/21/21 was negative. -[place] in 08/2021 showed she is still premenopausal, we discussed option of switching to AI if she has BSO.  I again reviewed the small risk of endometrial cancer from tamoxifen, and suggested her to watch vaginal bleeding and follow-up with GYN closely. -She has moderate hot flashes, she is on Effexor which she has reduced dose.  She still has gabapentin at home, I encouraged her to try that for hot flashes and related insomnia.    No problem-specific Assessment & Plan notes found for this encounter.     PLAN: -doing well, continue Imatinib  -Order CT Scan for December -Lab reviewed normal ,B12 was low normal, recommend OTC B12  - continue Tamoxifen -Lab and f/u in 3 months -CT CAP w contrast in 3 weeks at [place]: Oncology History Overview Note  Cancer Staging GIST (gastrointestinal stromal tumor) of small bowel, malignant (HCC) Staging form: Gastrointestinal Stromal Tumor - Small Intestinal, Esophageal, Colorectal, Mesenteric, and Peritoneal GIST, AJCC 8th Edition - Pathologic stage from 04/03/2021: Stage IIIB (pT4, pN0, cM0, Mitotic Rate: High) - Signed by Truitt Merle, MD on 04/23/2021  Malignant neoplasm of upper-outer quadrant of left breast in female, estrogen receptor positive ([place]) Staging form: Breast, AJCC 8th Edition - Clinical stage from 03/20/2018: Stage IA (cT1c, cN0, cM0, G2, ER+, PR+, HER2-) - Signed by Gardenia Phlegm, NP on 05/27/2018 - Pathologic stage from 05/07/2018: Stage IA (pT1c, pN0, cM0, G1, ER+, PR+, HER2-, Oncotype  DX score: 11) - Signed by Gardenia Phlegm, NP on 05/27/2018    Malignant neoplasm of upper-outer quadrant of left breast in female, estrogen receptor positive ([place])  03/20/2018 Initial Diagnosis   Screening detected left breast spiculated mass by  ultrasound measured 1.2 cm.  Biopsy revealed invasive ductal carcinoma grade 1-2 with high-grade DCIS, ER 95%, PR 95%, Ki-67 1%, HER-2 negative ratio 1.47, T1CN0 stage I a clinical stage   03/20/2018 Cancer Staging   Staging form: Breast, AJCC 8th Edition - Clinical stage from 03/20/2018: Stage IA (cT1c, cN0, cM0, G2, ER+, PR+, HER2-) - Signed by Gardenia Phlegm, NP on 05/27/2018   04/08/2018 Genetic Testing   NF1  c.2325G>A (Silent) pathogenic variant was identified.  This variant was reclassified from a VUS to pathogenic on June 20, 2021.  The Common Hereditary Cancer Panel offered by [facility] includes sequencing and/or deletion duplication testing of the following 47 genes: APC, ATM, AXIN2, BARD1, BMPR1A, BRCA1, BRCA2, BRIP1, CDH1, CDKN2A (p14ARF), CDKN2A (p16INK4a), CKD4, CHEK2, CTNNA1, DICER1, EPCAM (Deletion/duplication testing only), GREM1 (promoter region deletion/duplication testing only), KIT, MEN1, MLH1, MSH2, MSH3, MSH6, MUTYH, NBN, NF1, NHTL1, PALB2, PDGFRA, PMS2, POLD1, POLE, PTEN, RAD50, RAD51C, RAD51D, SDHB, SDHC, SDHD, SMAD4, SMARCA4. STK11, TP53, TSC1, TSC2, and VHL.  The following genes were evaluated for sequence changes only: SDHA and HOXB13 c.251G>A variant only.  Results: No pathogenic variants identified.  A Variant of uncertain significance in the gene NF1 was detected c.2325G>A (Silent). The date of this test report is 04/03/2018.    05/07/2018 Surgery   Left lumpectomy: IDC grade 1, 1.2 cm, intermediate grade DCIS, margins negative,PASH, 0/7 sentinel lymph nodes negative, ER 95%, PR 95%, Ki-67 1%, HER-2 negative ratio 1.47, T1CN0 stage Ia   05/07/2018 Cancer Staging   Staging form: Breast, AJCC 8th Edition - Pathologic stage from 05/07/2018: Stage IA (pT1c, pN0, cM0, G1, ER+, PR+, HER2-, Oncotype DX score: 11) - Signed by Gardenia Phlegm, NP on 05/27/2018   05/25/2018 Oncotype testing   Oncotype DX score 11: 3% risk of distant recurrence of 9 years, low risk    06/24/2018 - 08/03/2018 Radiation Therapy   Adjuvant radiation therapy   09/2018 -  Anti-estrogen oral therapy   Tamoxifen daily   GIST (gastrointestinal stromal tumor) of small bowel, malignant ([place])  04/03/2021 Cancer Staging   Staging form: Gastrointestinal Stromal Tumor - Small Intestinal, Esophageal, Colorectal, Mesenteric, and Peritoneal GIST, AJCC 8th Edition - Pathologic stage from 04/03/2021: Stage IIIB (pT4, pN0, cM0, Mitotic Rate: High) - Signed by Truitt Merle, MD on 04/23/2021 Stage prefix: Initial diagnosis Histologic grade (G): High grade Histologic grading system: 2 grade system Residual tumor (R): R0 - None   04/23/2021 Initial Diagnosis   GIST (gastrointestinal stromal tumor) of small bowel, malignant ([place])   05/15/2021 Imaging   CT Chest w/o contrast  IMPRESSION: 1. 6 mm nodular opacity in the posterior right lower lobe with a similar size focus of architectural distortion/scarring in this region on the remote study from 17 years ago, suggesting that this is simply scar although the finding is more confluent on today's study. While almost assuredly benign, consider follow-up CT chest in 6 months to ensure stability. 2. No evidence for metastatic disease in the chest.   08/21/2021 Imaging   EXAM: CT ABDOMEN AND PELVIS WITH CONTRAST  IMPRESSION: Interval surgical resection of large left abdominal cystic mass since prior exam. No evidence of tumor recurrence or metastatic disease within the abdomen or pelvis.   8 mm low-attenuation  lesion in pancreatic head, not definitely seen on prior exams. Consider further characterization with MRI, or continued surveillance on follow-up CT.   Small posterior uterine fibroid.   08/23/2021 Imaging   EXAM: MRI ABDOMEN WITHOUT AND WITH CONTRAST  IMPRESSION: 1. No evidence of pancreatic mass or suspicious correlate for the CT abnormality, which may have been artifactual or possibly related to heterogeneous fatty replacement. 2.  No acute process or evidence of metastatic disease in the abdomen. 3.  Aortic Atherosclerosis (ICD10-I70.0).      INTERVAL HISTORY:  Anne Horton is here for a follow up of  GIST of small bowel, h/o breast cancer She was last seen by me on 04/09/2022 She presents to the clinic alone. Pt reports she is doing well. PT has no other concerns.     All other systems were reviewed with the patient and are negative.  MEDICAL HISTORY:  Past Medical History:  Diagnosis Date   Allergy    Anemia    Anxiety    Breast cancer ([place]) 05/07/2018   left invasive ductal    Cancer (HCC)    Chest pain    Depression    Family history of breast cancer    Fibromyalgia    neurofibromatosis   H/O: depression    Hx of migraines    Mitral regurgitation    mild per echo EF 55-60%   Neurofibromatosis ([place])    Palpitations    Personal history of radiation therapy 08/10/2018   left breast 06/24/2018-08/10/2018  Dr [name]   Tachycardia    Hx. of   Tachycardia    Wears contact lenses     SURGICAL HISTORY: Past Surgical History:  Procedure Laterality Date   BOWEL RESECTION N/A 04/03/2021   Procedure: SMALL BOWEL RESECTION;  Surgeon: [name], MD;  Location: [facility];  Service: General;  Laterality: N/A;   BREAST BIOPSY     BREAST LUMPECTOMY Left 05/07/2018   Invasive ductal    BREAST LUMPECTOMY WITH RADIOACTIVE SEED AND SENTINEL LYMPH NODE BIOPSY Left 05/07/2018   Procedure: BREAST LUMPECTOMY WITH RADIOACTIVE SEED AND SENTINEL LYMPH NODE BIOPSY;  Surgeon: Rolm Bookbinder, MD;  Location: [facility];  Service: General;  Laterality: Left;   CESAREAN SECTION     CHOLECYSTECTOMY N/A 01/04/2015   Procedure: LAPAROSCOPIC CHOLECYSTECTOMY WITH INTRAOPERATIVE CHOLANGIOGRAM;  Surgeon: Erroll Luna, MD;  Location: [facility];  Service: General;  Laterality: N/A;   COLONOSCOPY     ESOPHAGOGASTRODUODENOSCOPY     LAPAROTOMY N/A 04/03/2021   Procedure: EXPLORATORY  LAPAROTOMY WITH RESECTION OF ABDOMINAL MASS;  Surgeon: [name], MD;  Location: [facility];  Service: General;  Laterality: N/A;   MASTOPEXY Bilateral 09/05/2020   Procedure: BILATERAL BREAST MASTOPEXY;  Surgeon: Irene Limbo, MD;  Location: [facility];  Service: Plastics;  Laterality: Bilateral;   OMENTECTOMY N/A 04/03/2021   Procedure: OMENTECTOMY;  Surgeon: [name], MD;  Location: [facility];  Service: General;  Laterality: N/A;   WISDOM TOOTH EXTRACTION      I have reviewed the social history and family history with the patient and they are unchanged from previous note.  ALLERGIES:  is allergic to iron sucrose and latex.  MEDICATIONS:  Current Outpatient Medications  Medication Sig Dispense Refill   ALPRAZolam (XANAX) 0.5 MG tablet Take 1/2 to 1 tablet by mouth 2 times a day as needed 180 tablet 1   ALPRAZolam (XANAX) 0.5 MG tablet Take 1/2 to 1 tablet by mouth 2 times a  day as needed 180 tablet 1   CALCIUM PO Take 2 tablets by mouth daily at 6 (six) AM. GUMMY     cetirizine (ZYRTEC) 10 MG tablet Take 10 mg by mouth daily.     imatinib (GLEEVEC) 400 MG tablet Take 1 tablet (400 mg total) by mouth daily. Take with meals and large glass of water.Caution:Chemotherapy. 30 tablet 3   metoprolol succinate (TOPROL-XL) 50 MG 24 hr tablet Take 1 tablet (50 mg total) by mouth daily. 90 tablet 3   Multiple Vitamin (MULTIVITAMIN) tablet Take 1 tablet by mouth daily.     ondansetron (ZOFRAN) 8 MG tablet Take 1 tablet (8 mg total) by mouth every 8 (eight) hours as needed for nausea or vomiting. 20 tablet 1   propranolol (INDERAL) 10 MG tablet TAKE 1 TABLET BY MOUTH FOUR TIMES DAILY AS NEEDED FOR PALPITATIONS 120 tablet 3   Sennosides-Docusate Sodium (SENNA S PO) Take 1 capsule by mouth daily at 6 (six) AM.     tamoxifen (NOLVADEX) 20 MG tablet TAKE 1 TABLET BY MOUTH ONCE A DAY 90 tablet 3   venlafaxine XR (EFFEXOR-XR) 37.5 MG 24 hr capsule Take 1 capsule (37.5 mg total) by mouth  daily. 90 capsule 1   venlafaxine XR (EFFEXOR-XR) 75 MG 24 hr capsule Take 1 capsule by mouth daily. 90 capsule 1   No current facility-administered medications for this visit.    PHYSICAL EXAMINATION: ECOG PERFORMANCE STATUS: 0 - Asymptomatic  Vitals:   08/15/22 1031  BP: (!) 117/55  Pulse: 83  Temp: 99.2 F (37.3 C)  SpO2: 100%   Wt Readings from Last 3 Encounters:  08/15/22 135 lb (61.2 kg)  04/09/22 130 lb 1.6 oz (59 kg)  02/07/22 131 lb 8 oz (59.6 kg)     GENERAL:alert, no distress and comfortable SKIN: skin color, texture, turgor are normal, no rashes or significant lesions EYES: normal, Conjunctiva are pink and non-injected, sclera clear NECK: supple, thyroid normal size, non-tender, without nodularity LYMPH:  no palpable lymphadenopathy in the cervical, axillary  LUNGS: clear to auscultation and percussion with normal breathing effort HEART: regular rate & rhythm and no murmurs and no lower extremity edema ABDOMEN:abdomen soft, non-tender and normal bowel sounds(-) Musculoskeletal:no cyanosis of digits and no clubbing  NEURO: alert & oriented x 3 with fluent speech, no focal motor/sensory deficits  LABORATORY DATA:  I have reviewed the data as listed    Latest Ref Rng & Units 08/15/2022   10:11 AM 04/09/2022    8:05 AM 02/07/2022    8:44 AM  CBC  WBC 4.0 - 10.5 K/uL 8.0  5.9  5.4   Hemoglobin 12.0 - 15.0 g/dL 12.1  11.9  12.1   Hematocrit 36.0 - 46.0 % 35.5  34.6  35.4   Platelets 150 - 400 K/uL 328  328  351         Latest Ref Rng & Units 08/15/2022   10:11 AM 04/09/2022    8:05 AM 02/07/2022    8:44 AM  CMP  Glucose 70 - 99 mg/dL 110  91  94   BUN 6 - 20 mg/dL _0 Creatinine 0.44 - 1.00 mg/dL 0.66  0.71  0.79   Sodium 135 - 145 mmol/L 139  140  139   Potassium 3.5 - 5.1 mmol/L 4.0  3.6  3.6   Chloride 98 - 111 mmol/L 108  109  107   CO2 22 - 32 mmol/L 23  26  25   Calcium 8.9 - 10.3 mg/dL 8.7  8.4  8.6   Total Protein 6.5 - 8.1 g/dL 6.5   6.3  6.6   Total Bilirubin 0.3 - 1.2 mg/dL 0.4  0.5  0.5   Alkaline Phos 38 - 126 U/L 37  48  46   AST 15 - 41 U/L _0 ALT 0 - 44 U/L _1 RADIOGRAPHIC STUDIES: I have personally reviewed the radiological images as listed and agreed with the findings in the report. No results found.    Orders Placed This Encounter  Procedures   CT CHEST ABDOMEN PELVIS W CONTRAST    Standing Status:   Future    Standing Expiration Date:   08/16/2023    Order Specific Question:   Is patient pregnant?    Answer:   No    Order Specific Question:   Preferred imaging location?    Answer:   [facility]    Order Specific Question:   Release to patient    Answer:   Immediate    Order Specific Question:   Is Oral Contrast requested for this exam?    Answer:   Yes, Per Radiology protocol   All questions were answered. The patient knows to call the clinic with any problems, questions or concerns. No barriers to learning was detected. The total time spent in the appointment was 30 minutes.     Truitt Merle, MD 08/15/2022   I, Audry Riles, CMA, am acting as scribe for Truitt Merle, MD.   I have reviewed the above documentation for accuracy and completeness, and I agree with the above.

## 2022-08-15 ENCOUNTER — Inpatient Hospital Stay: Payer: 59

## 2022-08-15 ENCOUNTER — Other Ambulatory Visit: Payer: Self-pay

## 2022-08-15 ENCOUNTER — Encounter: Payer: Self-pay | Admitting: Hematology

## 2022-08-15 ENCOUNTER — Inpatient Hospital Stay: Payer: 59 | Attending: Hematology | Admitting: Hematology

## 2022-08-15 VITALS — BP 117/55 | HR 83 | Temp 99.2°F | Ht 62.0 in | Wt 135.0 lb

## 2022-08-15 DIAGNOSIS — R918 Other nonspecific abnormal finding of lung field: Secondary | ICD-10-CM | POA: Diagnosis not present

## 2022-08-15 DIAGNOSIS — C50412 Malignant neoplasm of upper-outer quadrant of left female breast: Secondary | ICD-10-CM | POA: Insufficient documentation

## 2022-08-15 DIAGNOSIS — R232 Flushing: Secondary | ICD-10-CM | POA: Diagnosis not present

## 2022-08-15 DIAGNOSIS — Z170 Estrogen receptor positive status [ER+]: Secondary | ICD-10-CM | POA: Diagnosis not present

## 2022-08-15 DIAGNOSIS — G4700 Insomnia, unspecified: Secondary | ICD-10-CM | POA: Insufficient documentation

## 2022-08-15 DIAGNOSIS — Z9049 Acquired absence of other specified parts of digestive tract: Secondary | ICD-10-CM | POA: Insufficient documentation

## 2022-08-15 DIAGNOSIS — Z79810 Long term (current) use of selective estrogen receptor modulators (SERMs): Secondary | ICD-10-CM | POA: Insufficient documentation

## 2022-08-15 DIAGNOSIS — Z923 Personal history of irradiation: Secondary | ICD-10-CM | POA: Diagnosis not present

## 2022-08-15 DIAGNOSIS — F419 Anxiety disorder, unspecified: Secondary | ICD-10-CM | POA: Diagnosis not present

## 2022-08-15 DIAGNOSIS — D259 Leiomyoma of uterus, unspecified: Secondary | ICD-10-CM | POA: Diagnosis not present

## 2022-08-15 DIAGNOSIS — F32A Depression, unspecified: Secondary | ICD-10-CM | POA: Diagnosis not present

## 2022-08-15 DIAGNOSIS — Z803 Family history of malignant neoplasm of breast: Secondary | ICD-10-CM | POA: Insufficient documentation

## 2022-08-15 DIAGNOSIS — Q8500 Neurofibromatosis, unspecified: Secondary | ICD-10-CM | POA: Insufficient documentation

## 2022-08-15 DIAGNOSIS — D509 Iron deficiency anemia, unspecified: Secondary | ICD-10-CM | POA: Insufficient documentation

## 2022-08-15 DIAGNOSIS — I700 Atherosclerosis of aorta: Secondary | ICD-10-CM | POA: Diagnosis not present

## 2022-08-15 DIAGNOSIS — C49A3 Gastrointestinal stromal tumor of small intestine: Secondary | ICD-10-CM | POA: Insufficient documentation

## 2022-08-15 DIAGNOSIS — Z79899 Other long term (current) drug therapy: Secondary | ICD-10-CM | POA: Diagnosis not present

## 2022-08-15 LAB — CMP (CANCER CENTER ONLY)
ALT: 18 U/L (ref 0–44)
AST: 17 U/L (ref 15–41)
Albumin: 4.2 g/dL (ref 3.5–5.0)
Alkaline Phosphatase: 37 U/L — ABNORMAL LOW (ref 38–126)
Anion gap: 8 (ref 5–15)
BUN: 12 mg/dL (ref 6–20)
CO2: 23 mmol/L (ref 22–32)
Calcium: 8.7 mg/dL — ABNORMAL LOW (ref 8.9–10.3)
Chloride: 108 mmol/L (ref 98–111)
Creatinine: 0.66 mg/dL (ref 0.44–1.00)
GFR, Estimated: >60 mL/min (ref >60)
Glucose, Bld: 110 mg/dL — ABNORMAL HIGH (ref 70–99)
Potassium: 4 mmol/L (ref 3.5–5.1)
Sodium: 139 mmol/L (ref 135–145)
Total Bilirubin: 0.4 mg/dL (ref 0.3–1.2)
Total Protein: 6.5 g/dL (ref 6.5–8.1)

## 2022-08-15 LAB — CBC WITH DIFFERENTIAL (CANCER CENTER ONLY)
Abs Immature Granulocytes: 0.02 10*3/uL (ref 0.00–0.07)
Basophils Absolute: 0.1 10*3/uL (ref 0.0–0.1)
Basophils Relative: 1 %
Eosinophils Absolute: 0.2 10*3/uL (ref 0.0–0.5)
Eosinophils Relative: 2 %
HCT: 35.5 % — ABNORMAL LOW (ref 36.0–46.0)
Hemoglobin: 12.1 g/dL (ref 12.0–15.0)
Immature Granulocytes: 0 %
Lymphocytes Relative: 21 %
Lymphs Abs: 1.7 10*3/uL (ref 0.7–4.0)
MCH: 31.9 pg (ref 26.0–34.0)
MCHC: 34.1 g/dL (ref 30.0–36.0)
MCV: 93.7 fL (ref 80.0–100.0)
Monocytes Absolute: 0.5 10*3/uL (ref 0.1–1.0)
Monocytes Relative: 6 %
Neutro Abs: 5.5 10*3/uL (ref 1.7–7.7)
Neutrophils Relative %: 70 %
Platelet Count: 328 10*3/uL (ref 150–400)
RBC: 3.79 MIL/uL — ABNORMAL LOW (ref 3.87–5.11)
RDW: 12.4 % (ref 11.5–15.5)
WBC Count: 8 10*3/uL (ref 4.0–10.5)
nRBC: 0 % (ref 0.0–0.2)

## 2022-08-15 LAB — FERRITIN: Ferritin: 64 ng/mL (ref 11–307)

## 2022-08-16 ENCOUNTER — Telehealth: Payer: Self-pay | Admitting: Hematology

## 2022-08-16 NOTE — Telephone Encounter (Signed)
Called patient to notify of new appointment. Left voicemail with appointment details.

## 2022-08-19 ENCOUNTER — Other Ambulatory Visit (HOSPITAL_COMMUNITY): Payer: Self-pay

## 2022-08-21 ENCOUNTER — Other Ambulatory Visit (HOSPITAL_COMMUNITY): Payer: Self-pay

## 2022-08-23 ENCOUNTER — Other Ambulatory Visit (HOSPITAL_COMMUNITY): Payer: Self-pay

## 2022-08-29 ENCOUNTER — Encounter: Payer: Self-pay | Admitting: Hematology and Oncology

## 2022-08-30 ENCOUNTER — Other Ambulatory Visit: Payer: Self-pay

## 2022-08-30 ENCOUNTER — Other Ambulatory Visit (HOSPITAL_COMMUNITY): Payer: Self-pay

## 2022-08-31 ENCOUNTER — Encounter: Payer: Self-pay | Admitting: Hematology and Oncology

## 2022-09-02 ENCOUNTER — Other Ambulatory Visit: Payer: Self-pay

## 2022-09-04 ENCOUNTER — Ambulatory Visit (HOSPITAL_COMMUNITY)
Admission: RE | Admit: 2022-09-04 | Discharge: 2022-09-04 | Disposition: A | Discharge status: Home / Self Care | Payer: 59 | Source: Ambulatory Visit | Attending: Hematology | Admitting: Hematology

## 2022-09-04 DIAGNOSIS — C49A3 Gastrointestinal stromal tumor of small intestine: Secondary | ICD-10-CM | POA: Diagnosis not present

## 2022-09-04 DIAGNOSIS — K3189 Other diseases of stomach and duodenum: Secondary | ICD-10-CM | POA: Diagnosis not present

## 2022-09-04 DIAGNOSIS — J8410 Pulmonary fibrosis, unspecified: Secondary | ICD-10-CM | POA: Diagnosis not present

## 2022-09-04 DIAGNOSIS — K6389 Other specified diseases of intestine: Secondary | ICD-10-CM | POA: Diagnosis not present

## 2022-09-04 DIAGNOSIS — J984 Other disorders of lung: Secondary | ICD-10-CM | POA: Diagnosis not present

## 2022-09-04 DIAGNOSIS — D259 Leiomyoma of uterus, unspecified: Secondary | ICD-10-CM | POA: Diagnosis not present

## 2022-09-04 MED ORDER — IOHEXOL 9 MG/ML PO SOLN
1000.0000 mL | Freq: Once | ORAL | Status: AC
  Administered 2022-09-04: 1000 mL via ORAL

## 2022-09-04 MED ORDER — IOHEXOL 300 MG/ML  SOLN
100.0000 mL | Freq: Once | INTRAMUSCULAR | Status: AC | PRN
Start: 2022-09-04 — End: 2022-09-04
  Administered 2022-09-04: 100 mL via INTRAVENOUS

## 2022-09-05 ENCOUNTER — Inpatient Hospital Stay: Payer: 59 | Attending: Hematology | Admitting: Hematology

## 2022-09-05 ENCOUNTER — Other Ambulatory Visit: Payer: Self-pay | Admitting: Hematology

## 2022-09-05 ENCOUNTER — Other Ambulatory Visit (HOSPITAL_COMMUNITY): Payer: Self-pay

## 2022-09-05 DIAGNOSIS — C49A3 Gastrointestinal stromal tumor of small intestine: Secondary | ICD-10-CM

## 2022-09-05 MED ORDER — ONDANSETRON HCL 8 MG PO TABS
8.0000 mg | ORAL_TABLET | Freq: Three times a day (TID) | ORAL | 1 refills | Status: DC | PRN
Start: 2022-09-05 — End: 2023-03-13
  Filled 2022-09-05: qty 20, 7d supply, fill #0

## 2022-09-05 NOTE — Progress Notes (Signed)
Phone note   I personally reviewed her restaging CT scan images, and discussed the findings with [name].  There is a nonspecific increased wall thickening involve the small bowel at the level of anastomosis, slightly worse from last scan in June 2023.  She underwent push enteroscopy in May 2023 which was negative for recurrence.  Patient is not having any symptoms of bowel, I recommend continue monitoring.  She knows what to watch and will call if needed.   Truitt Merle  09/05/2022

## 2022-09-12 ENCOUNTER — Other Ambulatory Visit (HOSPITAL_COMMUNITY): Payer: Self-pay

## 2022-09-24 ENCOUNTER — Other Ambulatory Visit (HOSPITAL_COMMUNITY): Payer: Self-pay

## 2022-09-25 ENCOUNTER — Encounter: Payer: Self-pay | Admitting: Hematology

## 2022-09-26 ENCOUNTER — Other Ambulatory Visit (HOSPITAL_COMMUNITY): Payer: Self-pay

## 2022-10-01 ENCOUNTER — Other Ambulatory Visit (HOSPITAL_COMMUNITY): Payer: Self-pay

## 2022-10-01 ENCOUNTER — Encounter: Payer: Self-pay | Admitting: Hematology and Oncology

## 2022-10-07 ENCOUNTER — Encounter: Payer: Self-pay | Admitting: Cardiovascular Disease
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
## 2022-10-08 ENCOUNTER — Encounter: Payer: Self-pay | Admitting: Cardiovascular Disease

## 2022-10-08 ENCOUNTER — Ambulatory Visit: Payer: Commercial Managed Care - PPO | Attending: Cardiovascular Disease | Admitting: Cardiovascular Disease

## 2022-10-08 ENCOUNTER — Other Ambulatory Visit (HOSPITAL_COMMUNITY): Payer: Self-pay

## 2022-10-08 VITALS — BP 120/72 | HR 89 | Ht 62.0 in | Wt 137.8 lb

## 2022-10-08 DIAGNOSIS — R000 Tachycardia, unspecified: Secondary | ICD-10-CM | POA: Diagnosis not present

## 2022-10-08 MED ORDER — METOPROLOL SUCCINATE ER 50 MG PO TB24
50.0000 mg | ORAL_TABLET | Freq: Every day | ORAL | 3 refills | Status: DC
  Filled 2022-10-08: qty 90, 90d supply, fill #0
  Filled 2023-01-14: qty 90, 90d supply, fill #1
  Filled 2023-04-18 – 2023-04-28 (×2): qty 90, 90d supply, fill #2
  Filled 2023-07-24: qty 90, 90d supply, fill #3

## 2022-10-08 NOTE — Patient Instructions (Signed)
Medication Instructions:  Your physician recommends that you continue on your current medications as directed. Please refer to the Current Medication list given to you today.   *If you need a refill on your cardiac medications before your next appointment, please call your pharmacy*   Lab Work: none If you have labs (blood work) drawn today and your tests are completely normal, you will receive your results only by: [place] (if you have MyChart) OR A paper copy in the mail If you have any lab test that is abnormal or we need to change your treatment, we will call you to review the results.   Testing/Procedures: none   Follow-Up: At [facility], you and your health needs are our priority.  As part of our continuing mission to provide you with exceptional heart care, we have created designated Provider Care Teams.  These Care Teams include your primary Cardiologist (physician) and Advanced Practice Providers (APPs -  Physician Assistants and Nurse Practitioners) who all work together to provide you with the care you need, when you need it.  We recommend signing up for the patient portal called "MyChart".  Sign up information is provided on this After Visit Summary.  MyChart is used to connect with patients for Virtual Visits (Telemedicine).  Patients are able to view lab/test results, encounter notes, upcoming appointments, etc.  Non-urgent messages can be sent to your provider as well.   To learn more about what you can do with MyChart, go to [email or web address].    Your next appointment:   12 month(s)  Provider:   Mertie Moores, MD     Other Instructions

## 2022-10-09 ENCOUNTER — Other Ambulatory Visit (HOSPITAL_COMMUNITY): Payer: Self-pay

## 2022-10-09 ENCOUNTER — Other Ambulatory Visit: Payer: Self-pay | Admitting: Hematology

## 2022-10-09 ENCOUNTER — Telehealth: Payer: Self-pay

## 2022-10-09 MED ORDER — IMATINIB MESYLATE 400 MG PO TABS
400.0000 mg | ORAL_TABLET | Freq: Every day | ORAL | 3 refills | Status: DC
  Filled 2022-10-09: qty 30, 30d supply, fill #0
  Filled 2022-11-12: qty 30, 30d supply, fill #1
  Filled 2022-12-10: qty 30, 30d supply, fill #2
  Filled 2023-01-17: qty 30, 30d supply, fill #3

## 2022-10-09 NOTE — Telephone Encounter (Signed)
Pt called stating that she and her husband tested positive for Covid today 10/09/2022.  Pt stated their PCP does not want to prescribe antivirals d/t pt taking Gleevec and Tamoxifen.  Pt wanted to know what Dr. Burr Medico recommends and is "OK" with pt not taking antivirals.  Pt stated she's having congestion but denied fevers or any other symptoms.  Instructed pt to take Mucinex D and Robitussin DM for cough and congestion.  Informed pt that Dr. Burr Medico would like the pt to stop taking the [place] for 2 to 3 days if her symptoms get worse.  Pt verbalized understanding and had no further questions or concerns at this time.

## 2022-10-17 ENCOUNTER — Other Ambulatory Visit: Payer: Self-pay | Admitting: Hematology and Oncology

## 2022-10-17 ENCOUNTER — Other Ambulatory Visit (HOSPITAL_COMMUNITY): Payer: Self-pay

## 2022-10-17 MED ORDER — TAMOXIFEN CITRATE 20 MG PO TABS
ORAL_TABLET | Freq: Every day | ORAL | 3 refills | Status: DC
  Filled 2022-10-17: qty 90, 90d supply, fill #0
  Filled 2023-01-14: qty 90, 90d supply, fill #1
  Filled 2023-05-26: qty 90, 90d supply, fill #2
  Filled 2023-07-24 – 2023-08-27 (×2): qty 90, 90d supply, fill #3

## 2022-10-17 NOTE — Telephone Encounter (Signed)
Anne Horton, looks like she is a Dr. Burr Medico pt now.

## 2022-10-22 ENCOUNTER — Other Ambulatory Visit (HOSPITAL_COMMUNITY): Payer: Self-pay

## 2022-10-28 ENCOUNTER — Other Ambulatory Visit: Payer: Self-pay

## 2022-10-28 ENCOUNTER — Other Ambulatory Visit (HOSPITAL_COMMUNITY): Payer: Self-pay

## 2022-10-28 MED ORDER — VENLAFAXINE HCL ER 75 MG PO CP24
75.0000 mg | ORAL_CAPSULE | Freq: Every day | ORAL | 1 refills | Status: DC
  Filled 2022-10-28: qty 90, 90d supply, fill #0

## 2022-11-01 ENCOUNTER — Other Ambulatory Visit (HOSPITAL_COMMUNITY): Payer: Self-pay

## 2022-11-06 ENCOUNTER — Other Ambulatory Visit (HOSPITAL_COMMUNITY): Payer: Self-pay

## 2022-11-07 ENCOUNTER — Other Ambulatory Visit (HOSPITAL_COMMUNITY): Payer: Self-pay

## 2022-11-07 ENCOUNTER — Encounter: Payer: Self-pay | Admitting: Hematology

## 2022-11-11 ENCOUNTER — Other Ambulatory Visit: Payer: Self-pay

## 2022-11-12 ENCOUNTER — Other Ambulatory Visit (HOSPITAL_COMMUNITY): Payer: Self-pay

## 2022-11-14 ENCOUNTER — Other Ambulatory Visit: Payer: Self-pay

## 2022-11-14 DIAGNOSIS — C49A3 Gastrointestinal stromal tumor of small intestine: Secondary | ICD-10-CM

## 2022-11-14 DIAGNOSIS — Z170 Estrogen receptor positive status [ER+]: Secondary | ICD-10-CM

## 2022-11-15 ENCOUNTER — Inpatient Hospital Stay: Payer: Commercial Managed Care - PPO | Attending: Hematology

## 2022-11-15 ENCOUNTER — Encounter: Payer: Self-pay | Admitting: Hematology

## 2022-11-15 ENCOUNTER — Inpatient Hospital Stay (HOSPITAL_BASED_OUTPATIENT_CLINIC_OR_DEPARTMENT_OTHER): Payer: Commercial Managed Care - PPO | Admitting: Hematology

## 2022-11-15 ENCOUNTER — Other Ambulatory Visit (HOSPITAL_COMMUNITY): Payer: Self-pay

## 2022-11-15 ENCOUNTER — Other Ambulatory Visit: Payer: Self-pay | Admitting: Hematology

## 2022-11-15 VITALS — BP 115/59 | HR 80 | Temp 97.8°F | Resp 16 | Ht 62.0 in | Wt 138.0 lb

## 2022-11-15 DIAGNOSIS — R232 Flushing: Secondary | ICD-10-CM | POA: Diagnosis not present

## 2022-11-15 DIAGNOSIS — D259 Leiomyoma of uterus, unspecified: Secondary | ICD-10-CM | POA: Diagnosis not present

## 2022-11-15 DIAGNOSIS — Z79899 Other long term (current) drug therapy: Secondary | ICD-10-CM | POA: Diagnosis not present

## 2022-11-15 DIAGNOSIS — Z170 Estrogen receptor positive status [ER+]: Secondary | ICD-10-CM

## 2022-11-15 DIAGNOSIS — Z79810 Long term (current) use of selective estrogen receptor modulators (SERMs): Secondary | ICD-10-CM | POA: Diagnosis not present

## 2022-11-15 DIAGNOSIS — C49A3 Gastrointestinal stromal tumor of small intestine: Secondary | ICD-10-CM | POA: Diagnosis not present

## 2022-11-15 DIAGNOSIS — Z923 Personal history of irradiation: Secondary | ICD-10-CM | POA: Diagnosis not present

## 2022-11-15 DIAGNOSIS — I700 Atherosclerosis of aorta: Secondary | ICD-10-CM | POA: Diagnosis not present

## 2022-11-15 DIAGNOSIS — R110 Nausea: Secondary | ICD-10-CM | POA: Insufficient documentation

## 2022-11-15 DIAGNOSIS — Z1231 Encounter for screening mammogram for malignant neoplasm of breast: Secondary | ICD-10-CM | POA: Diagnosis not present

## 2022-11-15 DIAGNOSIS — G4700 Insomnia, unspecified: Secondary | ICD-10-CM | POA: Diagnosis not present

## 2022-11-15 DIAGNOSIS — Z9049 Acquired absence of other specified parts of digestive tract: Secondary | ICD-10-CM | POA: Diagnosis not present

## 2022-11-15 DIAGNOSIS — Q8500 Neurofibromatosis, unspecified: Secondary | ICD-10-CM | POA: Diagnosis not present

## 2022-11-15 DIAGNOSIS — C50412 Malignant neoplasm of upper-outer quadrant of left female breast: Secondary | ICD-10-CM | POA: Insufficient documentation

## 2022-11-15 DIAGNOSIS — N951 Menopausal and female climacteric states: Secondary | ICD-10-CM | POA: Diagnosis not present

## 2022-11-15 LAB — CMP (CANCER CENTER ONLY)
ALT: 20 U/L (ref 0–44)
AST: 23 U/L (ref 15–41)
Albumin: 4.1 g/dL (ref 3.5–5.0)
Alkaline Phosphatase: 38 U/L (ref 38–126)
Anion gap: 6 (ref 5–15)
BUN: 11 mg/dL (ref 6–20)
CO2: 23 mmol/L (ref 22–32)
Calcium: 8.6 mg/dL — ABNORMAL LOW (ref 8.9–10.3)
Chloride: 107 mmol/L (ref 98–111)
Creatinine: 0.8 mg/dL (ref 0.44–1.00)
GFR, Estimated: >60 mL/min (ref >60)
Glucose, Bld: 88 mg/dL (ref 70–99)
Potassium: 3.3 mmol/L — ABNORMAL LOW (ref 3.5–5.1)
Sodium: 136 mmol/L (ref 135–145)
Total Bilirubin: 0.6 mg/dL (ref 0.3–1.2)
Total Protein: 6.8 g/dL (ref 6.5–8.1)

## 2022-11-15 LAB — CBC WITH DIFFERENTIAL (CANCER CENTER ONLY)
Abs Immature Granulocytes: 0.03 10*3/uL (ref 0.00–0.07)
Basophils Absolute: 0.1 10*3/uL (ref 0.0–0.1)
Basophils Relative: 1 %
Eosinophils Absolute: 0.3 10*3/uL (ref 0.0–0.5)
Eosinophils Relative: 4 %
HCT: 35.1 % — ABNORMAL LOW (ref 36.0–46.0)
Hemoglobin: 11.9 g/dL — ABNORMAL LOW (ref 12.0–15.0)
Immature Granulocytes: 0 %
Lymphocytes Relative: 24 %
Lymphs Abs: 2 10*3/uL (ref 0.7–4.0)
MCH: 31.4 pg (ref 26.0–34.0)
MCHC: 33.9 g/dL (ref 30.0–36.0)
MCV: 92.6 fL (ref 80.0–100.0)
Monocytes Absolute: 0.7 10*3/uL (ref 0.1–1.0)
Monocytes Relative: 8 %
Neutro Abs: 5.1 10*3/uL (ref 1.7–7.7)
Neutrophils Relative %: 63 %
Platelet Count: 295 10*3/uL (ref 150–400)
RBC: 3.79 MIL/uL — ABNORMAL LOW (ref 3.87–5.11)
RDW: 13.3 % (ref 11.5–15.5)
WBC Count: 8.1 10*3/uL (ref 4.0–10.5)
nRBC: 0 % (ref 0.0–0.2)

## 2022-11-15 LAB — FERRITIN: Ferritin: 65 ng/mL (ref 11–307)

## 2022-11-15 MED ORDER — POTASSIUM CHLORIDE CRYS ER 10 MEQ PO TBCR
10.0000 meq | EXTENDED_RELEASE_TABLET | Freq: Every day | ORAL | 0 refills | Status: DC
  Filled 2022-11-15: qty 7, 7d supply, fill #0

## 2022-11-15 NOTE — Assessment & Plan Note (Addendum)
-  genetic testing 05/15/21 confirmed pathogenic variant in NF1. -There is also an increased chance for certain cancers including a type of eye cancer (optic nerve glioma), cancer of the tissues that cover the nerves (malignant peripheral nerve sheath tumors or MPNST), brain tumors, breast cancer, digestive tract tumors (gastrointestinal stromal tumors or GIST) and adrenal gland tumors (pheochromocytoma or [facility]).

## 2022-11-15 NOTE — Assessment & Plan Note (Signed)
Multifocal GIST of small bowel, pT4N0Mo, stage IIIB, high grade, mitotic rate 9/HPF -Diagnosed in 02/2022 -resection on 02/26/21 under Dr. Barry Dienes showed multiple tumor in small bowel, with three small lesions which were unable to be resected. Path confirmed GIST, high grade, margins and lymph nodes were negative. -she began imatinib 400 mg daily on 05/07/21. She is tolerating well without issue. -repeated CT have not sure evidence of recurrence  -She is clinically doing well, tolerating Gleevec well, will continue, for 3 years

## 2022-11-15 NOTE — Progress Notes (Signed)
[place]   [phone] Fax:([phone]) [phone]   Clinic Follow up Note   Patient Care Team: Daphene Calamity as PCP - General (Family Medicine) [name], Wonda Cheng, MD as PCP - Cardiology (Cardiology) [name], MD as Consulting Physician (Radiation Oncology) Nicholas Lose, MD as Consulting Physician (Hematology and Oncology) Rolm Bookbinder, MD as Consulting Physician (General Surgery) Truitt Merle, MD as Consulting Physician (Oncology)  Date of Service:  11/15/2022  CHIEF COMPLAINT: f/u of small bowel, h/o breast cancer   CURRENT THERAPY:  -Imatinib, 400 mg daily, starting 05/07/21 -tamoxifen, starting 08/2018  ASSESSMENT:  Anne Horton is a 47 y.o. female with   GIST (gastrointestinal stromal tumor) of small bowel, malignant ([place]) Multifocal GIST of small bowel, pT4N0Mo, stage IIIB, high grade, mitotic rate 9/HPF -Diagnosed in 02/2022 -resection on 02/26/21 under Dr. Barry Dienes showed multiple tumor in small bowel, with three small lesions which were unable to be resected. Path confirmed GIST, high grade, margins and lymph nodes were negative. -she began imatinib 400 mg daily on 05/07/21. She is tolerating well without issue. -repeated CT have not sure evidence of recurrence  -she recently had an episode of abdominal pain started last week, overall mild, and resolving, probably recurrent intussusception, no sign of bowel obstruction, she will continue monitoring herself   -She is clinically doing well, tolerating Gleevec well, will continue, for 3 years   Malignant neoplasm of upper-outer quadrant of left breast in female, estrogen receptor positive ([place]) ER+/PR+/HER2-, stage I (pT1cN0) -Oncotype DX score 11 -s/p lumpectomy in 04/2018 and adjuvant radiation -She is on adjuvant tamoxifen, tolerating well. Plan for 10 years  -most recent mammogram 03/21/21 was negative. -[place] in 08/2021 showed she is still premenopausal, we discussed option of  switching to AI if she has BSO.  I again reviewed the small risk of endometrial cancer from tamoxifen, and suggested her to watch vaginal bleeding and follow-up with GYN closely. -She has moderate hot flashes, she is on Effexor which she has reduced dose.  She still has gabapentin at home, I encouraged her to try that for hot flashes and related insomnia.  Neurofibromatosis -genetic testing 05/15/21 confirmed pathogenic variant in NF1. -There is also an increased chance for certain cancers including a type of eye cancer (optic nerve glioma), cancer of the tissues that cover the nerves (malignant peripheral nerve sheath tumors or MPNST), brain tumors, breast cancer, digestive tract tumors (gastrointestinal stromal tumors or GIST) and adrenal gland tumors (pheochromocytoma or [facility]).      PLAN: -lab reviewed. -Continue on Gleevec -Continue Tamoxifen -order Mammogram to be done in 04/2023 -lab and f/u 02/2023 after CT   SUMMARY OF ONCOLOGIC HISTORY: Oncology History Overview Note  Cancer Staging GIST (gastrointestinal stromal tumor) of small bowel, malignant ([place]) Staging form: Gastrointestinal Stromal Tumor - Small Intestinal, Esophageal, Colorectal, Mesenteric, and Peritoneal GIST, AJCC 8th Edition - Pathologic stage from 04/03/2021: Stage IIIB (pT4, pN0, cM0, Mitotic Rate: High) - Signed by Truitt Merle, MD on 04/23/2021  Malignant neoplasm of upper-outer quadrant of left breast in female, estrogen receptor positive ([place]) Staging form: Breast, AJCC 8th Edition - Clinical stage from 03/20/2018: Stage IA (cT1c, cN0, cM0, G2, ER+, PR+, HER2-) - Signed by Gardenia Phlegm, NP on 05/27/2018 - Pathologic stage from 05/07/2018: Stage IA (pT1c, pN0, cM0, G1, ER+, PR+, HER2-, Oncotype DX score: 11) - Signed by Gardenia Phlegm, NP on 05/27/2018    Malignant neoplasm of upper-outer quadrant of left breast in female, estrogen receptor  positive ([place])  03/20/2018 Initial Diagnosis   Screening  detected left breast spiculated mass by ultrasound measured 1.2 cm.  Biopsy revealed invasive ductal carcinoma grade 1-2 with high-grade DCIS, ER 95%, PR 95%, Ki-67 1%, HER-2 negative ratio 1.47, T1CN0 stage I a clinical stage   03/20/2018 Cancer Staging   Staging form: Breast, AJCC 8th Edition - Clinical stage from 03/20/2018: Stage IA (cT1c, cN0, cM0, G2, ER+, PR+, HER2-) - Signed by Gardenia Phlegm, NP on 05/27/2018   04/08/2018 Genetic Testing   NF1  c.2325G>A (Silent) pathogenic variant was identified.  This variant was reclassified from a VUS to pathogenic on June 20, 2021.  The Common Hereditary Cancer Panel offered by [facility] includes sequencing and/or deletion duplication testing of the following 47 genes: APC, ATM, AXIN2, BARD1, BMPR1A, BRCA1, BRCA2, BRIP1, CDH1, CDKN2A (p14ARF), CDKN2A (p16INK4a), CKD4, CHEK2, CTNNA1, DICER1, EPCAM (Deletion/duplication testing only), GREM1 (promoter region deletion/duplication testing only), KIT, MEN1, MLH1, MSH2, MSH3, MSH6, MUTYH, NBN, NF1, NHTL1, PALB2, PDGFRA, PMS2, POLD1, POLE, PTEN, RAD50, RAD51C, RAD51D, SDHB, SDHC, SDHD, SMAD4, SMARCA4. STK11, TP53, TSC1, TSC2, and VHL.  The following genes were evaluated for sequence changes only: SDHA and HOXB13 c.251G>A variant only.  Results: No pathogenic variants identified.  A Variant of uncertain significance in the gene NF1 was detected c.2325G>A (Silent). The date of this test report is 04/03/2018.    05/07/2018 Surgery   Left lumpectomy: IDC grade 1, 1.2 cm, intermediate grade DCIS, margins negative,PASH, 0/7 sentinel lymph nodes negative, ER 95%, PR 95%, Ki-67 1%, HER-2 negative ratio 1.47, T1CN0 stage Ia   05/07/2018 Cancer Staging   Staging form: Breast, AJCC 8th Edition - Pathologic stage from 05/07/2018: Stage IA (pT1c, pN0, cM0, G1, ER+, PR+, HER2-, Oncotype DX score: 11) - Signed by Gardenia Phlegm, NP on 05/27/2018   05/25/2018 Oncotype testing   Oncotype DX score 11: 3% risk of  distant recurrence of 9 years, low risk   06/24/2018 - 08/03/2018 Radiation Therapy   Adjuvant radiation therapy   09/2018 -  Anti-estrogen oral therapy   Tamoxifen daily   GIST (gastrointestinal stromal tumor) of small bowel, malignant ([place])  04/03/2021 Cancer Staging   Staging form: Gastrointestinal Stromal Tumor - Small Intestinal, Esophageal, Colorectal, Mesenteric, and Peritoneal GIST, AJCC 8th Edition - Pathologic stage from 04/03/2021: Stage IIIB (pT4, pN0, cM0, Mitotic Rate: High) - Signed by Truitt Merle, MD on 04/23/2021 Stage prefix: Initial diagnosis Histologic grade (G): High grade Histologic grading system: 2 grade system Residual tumor (R): R0 - None   04/23/2021 Initial Diagnosis   GIST (gastrointestinal stromal tumor) of small bowel, malignant ([place])   05/15/2021 Imaging   CT Chest w/o contrast  IMPRESSION: 1. 6 mm nodular opacity in the posterior right lower lobe with a similar size focus of architectural distortion/scarring in this region on the remote study from 17 years ago, suggesting that this is simply scar although the finding is more confluent on today's study. While almost assuredly benign, consider follow-up CT chest in 6 months to ensure stability. 2. No evidence for metastatic disease in the chest.   08/21/2021 Imaging   EXAM: CT ABDOMEN AND PELVIS WITH CONTRAST  IMPRESSION: Interval surgical resection of large left abdominal cystic mass since prior exam. No evidence of tumor recurrence or metastatic disease within the abdomen or pelvis.   8 mm low-attenuation lesion in pancreatic head, not definitely seen on prior exams. Consider further characterization with MRI, or continued surveillance on follow-up CT.   Small posterior uterine fibroid.  08/23/2021 Imaging   EXAM: MRI ABDOMEN WITHOUT AND WITH CONTRAST  IMPRESSION: 1. No evidence of pancreatic mass or suspicious correlate for the CT abnormality, which may have been artifactual or possibly related  to heterogeneous fatty replacement. 2. No acute process or evidence of metastatic disease in the abdomen. 3.  Aortic Atherosclerosis (ICD10-I70.0).      INTERVAL HISTORY:  Anne Horton is here for a follow up of  small bowel, h/o breast cancer She was last seen by me on 08/15/2022 She presents to the clinic alone. Pt state the pain comes intermittent. Pt also reports of some nausea. Pt denied having any bleeding and and BM issues. Pt had questions about memory issues. Pt states hot flashes are tolerable.    All other systems were reviewed with the patient and are negative.  MEDICAL HISTORY:  Past Medical History:  Diagnosis Date   Allergy    Anemia    Anxiety    Breast cancer ([place]) 05/07/2018   left invasive ductal    Cancer (H[facility])    Chest pain    Depression    Family history of breast cancer    Fibromyalgia    neurofibromatosis   H/O: depression    Hx of migraines    Mitral regurgitation    mild per echo EF 55-60%   Neurofibromatosis ([place])    Palpitations    Personal history of radiation therapy 08/10/2018   left breast 06/24/2018-08/10/2018  Dr [name]   Tachycardia    Hx. of   Tachycardia    Wears contact lenses     SURGICAL HISTORY: Past Surgical History:  Procedure Laterality Date   BOWEL RESECTION N/A 04/03/2021   Procedure: SMALL BOWEL RESECTION;  Surgeon: [name], MD;  Location: [place];  Service: General;  Laterality: N/A;   BREAST BIOPSY     BREAST LUMPECTOMY Left 05/07/2018   Invasive ductal    BREAST LUMPECTOMY WITH RADIOACTIVE SEED AND SENTINEL LYMPH NODE BIOPSY Left 05/07/2018   Procedure: BREAST LUMPECTOMY WITH RADIOACTIVE SEED AND SENTINEL LYMPH NODE BIOPSY;  Surgeon: Rolm Bookbinder, MD;  Location: [place];  Service: General;  Laterality: Left;   CESAREAN SECTION     CHOLECYSTECTOMY N/A 01/04/2015   Procedure: LAPAROSCOPIC CHOLECYSTECTOMY WITH INTRAOPERATIVE CHOLANGIOGRAM;  Surgeon: Erroll Luna, MD;  Location:  [place];  Service: General;  Laterality: N/A;   COLONOSCOPY     ESOPHAGOGASTRODUODENOSCOPY     LAPAROTOMY N/A 04/03/2021   Procedure: EXPLORATORY LAPAROTOMY WITH RESECTION OF ABDOMINAL MASS;  Surgeon: [name], MD;  Location: [place];  Service: General;  Laterality: N/A;   MASTOPEXY Bilateral 09/05/2020   Procedure: BILATERAL BREAST MASTOPEXY;  Surgeon: Irene Limbo, MD;  Location: [place];  Service: Plastics;  Laterality: Bilateral;   OMENTECTOMY N/A 04/03/2021   Procedure: OMENTECTOMY;  Surgeon: [name], MD;  Location: [place];  Service: General;  Laterality: N/A;   WISDOM TOOTH EXTRACTION      I have reviewed the social history and family history with the patient and they are unchanged from previous note.  ALLERGIES:  is allergic to iron sucrose and latex.  MEDICATIONS:  Current Outpatient Medications  Medication Sig Dispense Refill   ALPRAZolam (XANAX) 0.5 MG tablet Take 1/2 to 1 tablet by mouth 2 times a day as needed 180 tablet 1   CALCIUM PO Take 2 tablets by mouth daily at 6 (six) AM. GUMMY     cetirizine (ZYRTEC) 10 MG tablet Take 10 mg  by mouth daily.     imatinib (GLEEVEC) 400 MG tablet Take 1 tablet (400 mg total) by mouth daily. Take with meals and large glass of water.Caution:Chemotherapy. 30 tablet 3   metoprolol succinate (TOPROL-XL) 50 MG 24 hr tablet Take 1 tablet (50 mg total) by mouth daily. 90 tablet 3   Multiple Vitamin (MULTIVITAMIN) tablet Take 1 tablet by mouth daily.     ondansetron (ZOFRAN) 8 MG tablet Take 1 tablet (8 mg total) by mouth every 8 (eight) hours as needed for nausea or vomiting. 20 tablet 1   propranolol (INDERAL) 10 MG tablet TAKE 1 TABLET BY MOUTH FOUR TIMES DAILY AS NEEDED FOR PALPITATIONS 120 tablet 3   tamoxifen (NOLVADEX) 20 MG tablet TAKE 1 TABLET BY MOUTH ONCE A DAY 90 tablet 3   venlafaxine XR (EFFEXOR-XR) 75 MG 24 hr capsule Take 1 capsule by mouth daily. 90 capsule 1   venlafaxine XR  (EFFEXOR-XR) 75 MG 24 hr capsule Take 1 capsule (75 mg total) by mouth daily. 90 capsule 1   No current facility-administered medications for this visit.    PHYSICAL EXAMINATION: ECOG PERFORMANCE STATUS: 1 - Symptomatic but completely ambulatory  Vitals:   11/15/22 1230  BP: (!) 115/59  Pulse: 80  Resp: 16  Temp: 97.8 F (36.6 C)  SpO2: 100%   Wt Readings from Last 3 Encounters:  11/15/22 138 lb (62.6 kg)  10/08/22 137 lb 12.8 oz (62.5 kg)  08/15/22 135 lb (61.2 kg)     ABDOMEN:(-) abdomen soft, non-tender and (-) normal bowel sounds Musculoskeletal:no cyanosis of digits and no clubbing  NEURO: alert & oriented x 3 with fluent speech, no focal motor/sensory deficits BREAST: RT breast no palpable mass, LT breast lumpectomy a little firm and scar tissue from radiation.No palpable mass, breast exam benign.     LABORATORY DATA:  I have reviewed the data as listed    Latest Ref Rng & Units 11/15/2022   12:03 PM 08/15/2022   10:11 AM 04/09/2022    8:05 AM  CBC  WBC 4.0 - 10.5 K/uL 8.1  8.0  5.9   Hemoglobin 12.0 - 15.0 g/dL 11.9  12.1  11.9   Hematocrit 36.0 - 46.0 % 35.1  35.5  34.6   Platelets 150 - 400 K/uL 295  328  328         Latest Ref Rng & Units 11/15/2022   12:03 PM 08/15/2022   10:11 AM 04/09/2022    8:05 AM  CMP  Glucose 70 - 99 mg/dL 88  110  91   BUN 6 - 20 mg/dL '11  12  13   '$ Creatinine 0.44 - 1.00 mg/dL 0.80  0.66  0.71   Sodium 135 - 145 mmol/L 136  139  140   Potassium 3.5 - 5.1 mmol/L 3.3  4.0  3.6   Chloride 98 - 111 mmol/L 107  108  109   CO2 22 - 32 mmol/L '23  23  26   '$ Calcium 8.9 - 10.3 mg/dL 8.6  8.7  8.4   Total Protein 6.5 - 8.1 g/dL 6.8  6.5  6.3   Total Bilirubin 0.3 - 1.2 mg/dL 0.6  0.4  0.5   Alkaline Phos 38 - 126 U/L 38  37  48   AST 15 - 41 U/L '23  17  21   '$ ALT 0 - 44 U/L '20  18  20       '$ RADIOGRAPHIC STUDIES: I have personally reviewed the radiological  images as listed and agreed with the findings in the report. No results  found.    Orders Placed This Encounter  Procedures   MM Digital Screening    Standing Status:   Future    Standing Expiration Date:   11/15/2023    Order Specific Question:   Reason for Exam (SYMPTOM  OR DIAGNOSIS REQUIRED)    Answer:   screening    Order Specific Question:   Is the patient pregnant?    Answer:   No    Order Specific Question:   Preferred imaging location?    Answer:   [facility]   All questions were answered. The patient knows to call the clinic with any problems, questions or concerns. No barriers to learning was detected. The total time spent in the appointment was 30 minutes.     Truitt Merle, MD 11/15/2022   Felicity Coyer, CMA, am acting as scribe for Truitt Merle, MD.   I have reviewed the above documentation for accuracy and completeness, and I agree with the above.

## 2022-11-15 NOTE — Assessment & Plan Note (Signed)
ER+/PR+/HER2-, stage I (pT1cN0) -Oncotype DX score 11 -s/p lumpectomy in 04/2018 and adjuvant radiation -She is on adjuvant tamoxifen, tolerating well. Plan for 10 years  -most recent mammogram 03/21/21 was negative. -[place] [place] in 08/2021 showed she is still premenopausal, we discussed option of switching to AI if she has BSO.  I again reviewed the small risk of endometrial cancer from tamoxifen, and suggested her to watch vaginal bleeding and follow-up with GYN closely. -She has moderate hot flashes, she is on Effexor which she has reduced dose.  She still has gabapentin at home, I encouraged her to try that for hot flashes and related insomnia.

## 2022-11-18 ENCOUNTER — Telehealth: Payer: Self-pay | Admitting: Family Medicine

## 2022-11-18 ENCOUNTER — Other Ambulatory Visit: Payer: Self-pay

## 2022-11-18 NOTE — Telephone Encounter (Signed)
Per 3/1 LOS called to setup appointment and labs, left voicemail.

## 2022-11-27 ENCOUNTER — Encounter: Payer: Self-pay | Admitting: Hematology and Oncology

## 2022-12-10 ENCOUNTER — Other Ambulatory Visit (HOSPITAL_COMMUNITY): Payer: Self-pay

## 2022-12-17 ENCOUNTER — Other Ambulatory Visit: Payer: Self-pay

## 2023-01-09 ENCOUNTER — Other Ambulatory Visit (HOSPITAL_COMMUNITY): Payer: Self-pay

## 2023-01-09 DIAGNOSIS — F330 Major depressive disorder, recurrent, mild: Secondary | ICD-10-CM | POA: Diagnosis not present

## 2023-01-09 DIAGNOSIS — F411 Generalized anxiety disorder: Secondary | ICD-10-CM | POA: Diagnosis not present

## 2023-01-09 MED ORDER — VENLAFAXINE HCL ER 75 MG PO CP24
75.0000 mg | ORAL_CAPSULE | Freq: Every day | ORAL | 1 refills | Status: DC
  Filled 2023-01-09: qty 90, 90d supply, fill #0

## 2023-01-09 MED ORDER — ALPRAZOLAM 0.5 MG PO TABS
0.2500 mg | ORAL_TABLET | Freq: Two times a day (BID) | ORAL | 1 refills | Status: DC | PRN
  Filled 2023-01-09: qty 180, 90d supply, fill #0

## 2023-01-14 ENCOUNTER — Other Ambulatory Visit (HOSPITAL_COMMUNITY): Payer: Self-pay

## 2023-01-17 ENCOUNTER — Other Ambulatory Visit (HOSPITAL_COMMUNITY): Payer: Self-pay

## 2023-01-20 ENCOUNTER — Other Ambulatory Visit: Payer: Self-pay

## 2023-02-04 ENCOUNTER — Other Ambulatory Visit: Payer: Self-pay | Admitting: Obstetrics and Gynecology

## 2023-02-04 DIAGNOSIS — N6324 Unspecified lump in the left breast, lower inner quadrant: Secondary | ICD-10-CM | POA: Diagnosis not present

## 2023-02-04 DIAGNOSIS — Z01419 Encounter for gynecological examination (general) (routine) without abnormal findings: Secondary | ICD-10-CM | POA: Diagnosis not present

## 2023-02-04 DIAGNOSIS — Z1151 Encounter for screening for human papillomavirus (HPV): Secondary | ICD-10-CM | POA: Diagnosis not present

## 2023-02-12 ENCOUNTER — Telehealth: Payer: Self-pay | Admitting: Hematology

## 2023-02-12 NOTE — Telephone Encounter (Signed)
Patient is aware of the appointment being rescheduled.

## 2023-02-17 ENCOUNTER — Other Ambulatory Visit: Payer: Self-pay

## 2023-02-18 ENCOUNTER — Other Ambulatory Visit: Payer: Self-pay | Admitting: Obstetrics and Gynecology

## 2023-02-18 ENCOUNTER — Ambulatory Visit
Admission: RE | Admit: 2023-02-18 | Discharge: 2023-02-18 | Disposition: A | Discharge status: Home / Self Care | Payer: Commercial Managed Care - PPO | Source: Ambulatory Visit | Attending: Obstetrics and Gynecology | Admitting: Obstetrics and Gynecology

## 2023-02-18 DIAGNOSIS — N6324 Unspecified lump in the left breast, lower inner quadrant: Secondary | ICD-10-CM

## 2023-02-18 DIAGNOSIS — Z853 Personal history of malignant neoplasm of breast: Secondary | ICD-10-CM | POA: Diagnosis not present

## 2023-02-18 DIAGNOSIS — N641 Fat necrosis of breast: Secondary | ICD-10-CM | POA: Diagnosis not present

## 2023-02-19 ENCOUNTER — Other Ambulatory Visit (HOSPITAL_COMMUNITY): Payer: Self-pay

## 2023-02-19 ENCOUNTER — Other Ambulatory Visit: Payer: Self-pay

## 2023-02-19 ENCOUNTER — Other Ambulatory Visit: Payer: Self-pay | Admitting: Hematology

## 2023-02-19 MED ORDER — IMATINIB MESYLATE 400 MG PO TABS
400.0000 mg | ORAL_TABLET | Freq: Every day | ORAL | 3 refills | Status: DC
  Filled 2023-02-19: qty 30, 30d supply, fill #0

## 2023-02-20 ENCOUNTER — Other Ambulatory Visit (HOSPITAL_COMMUNITY): Payer: Self-pay

## 2023-03-04 ENCOUNTER — Ambulatory Visit (HOSPITAL_COMMUNITY)
Admission: RE | Admit: 2023-03-04 | Discharge: 2023-03-04 | Disposition: A | Discharge status: Home / Self Care | Payer: Commercial Managed Care - PPO | Source: Ambulatory Visit | Attending: Hematology | Admitting: Hematology

## 2023-03-04 DIAGNOSIS — C49A3 Gastrointestinal stromal tumor of small intestine: Secondary | ICD-10-CM | POA: Diagnosis not present

## 2023-03-04 DIAGNOSIS — E278 Other specified disorders of adrenal gland: Secondary | ICD-10-CM | POA: Diagnosis not present

## 2023-03-04 MED ORDER — IOHEXOL 300 MG/ML  SOLN
100.0000 mL | Freq: Once | INTRAMUSCULAR | Status: AC | PRN
  Administered 2023-03-04: 100 mL via INTRAVENOUS

## 2023-03-06 ENCOUNTER — Other Ambulatory Visit: Payer: Self-pay

## 2023-03-06 ENCOUNTER — Inpatient Hospital Stay: Payer: Commercial Managed Care - PPO | Attending: Hematology

## 2023-03-06 DIAGNOSIS — Z833 Family history of diabetes mellitus: Secondary | ICD-10-CM | POA: Diagnosis not present

## 2023-03-06 DIAGNOSIS — C50412 Malignant neoplasm of upper-outer quadrant of left female breast: Secondary | ICD-10-CM | POA: Diagnosis not present

## 2023-03-06 DIAGNOSIS — Z83719 Family history of colon polyps, unspecified: Secondary | ICD-10-CM | POA: Insufficient documentation

## 2023-03-06 DIAGNOSIS — Z8249 Family history of ischemic heart disease and other diseases of the circulatory system: Secondary | ICD-10-CM | POA: Insufficient documentation

## 2023-03-06 DIAGNOSIS — I700 Atherosclerosis of aorta: Secondary | ICD-10-CM | POA: Diagnosis not present

## 2023-03-06 DIAGNOSIS — M25551 Pain in right hip: Secondary | ICD-10-CM | POA: Diagnosis not present

## 2023-03-06 DIAGNOSIS — R102 Pelvic and perineal pain: Secondary | ICD-10-CM | POA: Insufficient documentation

## 2023-03-06 DIAGNOSIS — Z803 Family history of malignant neoplasm of breast: Secondary | ICD-10-CM | POA: Insufficient documentation

## 2023-03-06 DIAGNOSIS — C49A3 Gastrointestinal stromal tumor of small intestine: Secondary | ICD-10-CM | POA: Diagnosis not present

## 2023-03-06 DIAGNOSIS — Z79899 Other long term (current) drug therapy: Secondary | ICD-10-CM | POA: Insufficient documentation

## 2023-03-06 DIAGNOSIS — Z823 Family history of stroke: Secondary | ICD-10-CM | POA: Insufficient documentation

## 2023-03-06 DIAGNOSIS — D259 Leiomyoma of uterus, unspecified: Secondary | ICD-10-CM | POA: Diagnosis not present

## 2023-03-06 DIAGNOSIS — Z9049 Acquired absence of other specified parts of digestive tract: Secondary | ICD-10-CM | POA: Diagnosis not present

## 2023-03-06 DIAGNOSIS — Z170 Estrogen receptor positive status [ER+]: Secondary | ICD-10-CM | POA: Diagnosis not present

## 2023-03-06 LAB — CBC WITH DIFFERENTIAL (CANCER CENTER ONLY)
Abs Immature Granulocytes: 0.03 10*3/uL (ref 0.00–0.07)
Basophils Absolute: 0.1 10*3/uL (ref 0.0–0.1)
Basophils Relative: 1 %
Eosinophils Absolute: 0.2 10*3/uL (ref 0.0–0.5)
Eosinophils Relative: 3 %
HCT: 36.3 % (ref 36.0–46.0)
Hemoglobin: 12.2 g/dL (ref 12.0–15.0)
Immature Granulocytes: 0 %
Lymphocytes Relative: 24 %
Lymphs Abs: 1.9 10*3/uL (ref 0.7–4.0)
MCH: 32.3 pg (ref 26.0–34.0)
MCHC: 33.6 g/dL (ref 30.0–36.0)
MCV: 96 fL (ref 80.0–100.0)
Monocytes Absolute: 0.9 10*3/uL (ref 0.1–1.0)
Monocytes Relative: 12 %
Neutro Abs: 4.6 10*3/uL (ref 1.7–7.7)
Neutrophils Relative %: 60 %
Platelet Count: 390 10*3/uL (ref 150–400)
RBC: 3.78 MIL/uL — ABNORMAL LOW (ref 3.87–5.11)
RDW: 12.6 % (ref 11.5–15.5)
WBC Count: 7.7 10*3/uL (ref 4.0–10.5)
nRBC: 0 % (ref 0.0–0.2)

## 2023-03-06 LAB — CMP (CANCER CENTER ONLY)
ALT: 20 U/L (ref 0–44)
AST: 20 U/L (ref 15–41)
Albumin: 3.8 g/dL (ref 3.5–5.0)
Alkaline Phosphatase: 41 U/L (ref 38–126)
Anion gap: 7 (ref 5–15)
BUN: 12 mg/dL (ref 6–20)
CO2: 26 mmol/L (ref 22–32)
Calcium: 8.7 mg/dL — ABNORMAL LOW (ref 8.9–10.3)
Chloride: 108 mmol/L (ref 98–111)
Creatinine: 0.7 mg/dL (ref 0.44–1.00)
GFR, Estimated: >60 mL/min (ref >60)
Glucose, Bld: 97 mg/dL (ref 70–99)
Potassium: 3.6 mmol/L (ref 3.5–5.1)
Sodium: 141 mmol/L (ref 135–145)
Total Bilirubin: 0.5 mg/dL (ref 0.3–1.2)
Total Protein: 6.3 g/dL — ABNORMAL LOW (ref 6.5–8.1)

## 2023-03-06 LAB — FERRITIN: Ferritin: 77 ng/mL (ref 11–307)

## 2023-03-10 ENCOUNTER — Other Ambulatory Visit: Payer: Self-pay | Admitting: Gynecologic Oncology

## 2023-03-10 DIAGNOSIS — Q8500 Neurofibromatosis, unspecified: Secondary | ICD-10-CM

## 2023-03-10 DIAGNOSIS — R1900 Intra-abdominal and pelvic swelling, mass and lump, unspecified site: Secondary | ICD-10-CM

## 2023-03-10 DIAGNOSIS — C49A3 Gastrointestinal stromal tumor of small intestine: Secondary | ICD-10-CM

## 2023-03-11 ENCOUNTER — Ambulatory Visit (HOSPITAL_COMMUNITY)
Admission: RE | Admit: 2023-03-11 | Discharge: 2023-03-11 | Disposition: A | Discharge status: Home / Self Care | Payer: Commercial Managed Care - PPO | Source: Ambulatory Visit | Attending: Gynecologic Oncology | Admitting: Gynecologic Oncology

## 2023-03-11 ENCOUNTER — Encounter: Payer: Self-pay | Admitting: Hematology

## 2023-03-11 DIAGNOSIS — Q8500 Neurofibromatosis, unspecified: Secondary | ICD-10-CM | POA: Diagnosis not present

## 2023-03-11 DIAGNOSIS — C49A3 Gastrointestinal stromal tumor of small intestine: Secondary | ICD-10-CM | POA: Insufficient documentation

## 2023-03-11 DIAGNOSIS — R1900 Intra-abdominal and pelvic swelling, mass and lump, unspecified site: Secondary | ICD-10-CM | POA: Insufficient documentation

## 2023-03-11 DIAGNOSIS — D259 Leiomyoma of uterus, unspecified: Secondary | ICD-10-CM | POA: Diagnosis not present

## 2023-03-11 MED ORDER — GADOBUTROL 1 MMOL/ML IV SOLN
7.0000 mL | Freq: Once | INTRAVENOUS | Status: AC | PRN
  Administered 2023-03-11: 7 mL via INTRAVENOUS

## 2023-03-12 ENCOUNTER — Inpatient Hospital Stay: Payer: Commercial Managed Care - PPO

## 2023-03-12 ENCOUNTER — Other Ambulatory Visit: Payer: Self-pay | Admitting: Nurse Practitioner

## 2023-03-12 ENCOUNTER — Other Ambulatory Visit: Payer: Self-pay

## 2023-03-12 ENCOUNTER — Other Ambulatory Visit (HOSPITAL_COMMUNITY): Payer: Self-pay

## 2023-03-12 ENCOUNTER — Other Ambulatory Visit: Payer: Self-pay | Admitting: Gynecologic Oncology

## 2023-03-12 DIAGNOSIS — C50412 Malignant neoplasm of upper-outer quadrant of left female breast: Secondary | ICD-10-CM | POA: Diagnosis not present

## 2023-03-12 DIAGNOSIS — Z170 Estrogen receptor positive status [ER+]: Secondary | ICD-10-CM | POA: Diagnosis not present

## 2023-03-12 DIAGNOSIS — N9489 Other specified conditions associated with female genital organs and menstrual cycle: Secondary | ICD-10-CM

## 2023-03-12 DIAGNOSIS — D259 Leiomyoma of uterus, unspecified: Secondary | ICD-10-CM | POA: Diagnosis not present

## 2023-03-12 DIAGNOSIS — Z9049 Acquired absence of other specified parts of digestive tract: Secondary | ICD-10-CM | POA: Diagnosis not present

## 2023-03-12 DIAGNOSIS — Z803 Family history of malignant neoplasm of breast: Secondary | ICD-10-CM | POA: Diagnosis not present

## 2023-03-12 DIAGNOSIS — R102 Pelvic and perineal pain: Secondary | ICD-10-CM | POA: Diagnosis not present

## 2023-03-12 DIAGNOSIS — M25551 Pain in right hip: Secondary | ICD-10-CM | POA: Diagnosis not present

## 2023-03-12 DIAGNOSIS — Z79899 Other long term (current) drug therapy: Secondary | ICD-10-CM | POA: Diagnosis not present

## 2023-03-12 DIAGNOSIS — I700 Atherosclerosis of aorta: Secondary | ICD-10-CM | POA: Diagnosis not present

## 2023-03-12 DIAGNOSIS — C49A3 Gastrointestinal stromal tumor of small intestine: Secondary | ICD-10-CM | POA: Diagnosis not present

## 2023-03-12 LAB — CEA (ACCESS): CEA (CHCC): 1.73 ng/mL (ref 0.00–5.00)

## 2023-03-12 MED ORDER — TRAMADOL HCL 50 MG PO TABS
50.0000 mg | ORAL_TABLET | Freq: Four times a day (QID) | ORAL | 0 refills | Status: DC | PRN
  Filled 2023-03-12: qty 30, 8d supply, fill #0

## 2023-03-12 NOTE — Progress Notes (Signed)
error 

## 2023-03-12 NOTE — Progress Notes (Signed)
Pt reporting intermittent lower abdominal cramping and pain. She has taken this before and helps. Requesting refill which is being sent now.   Santiago Glad, NP

## 2023-03-13 ENCOUNTER — Encounter: Payer: Self-pay | Admitting: Gynecologic Oncology

## 2023-03-13 ENCOUNTER — Encounter: Payer: Self-pay | Admitting: Nurse Practitioner

## 2023-03-13 ENCOUNTER — Other Ambulatory Visit (HOSPITAL_COMMUNITY): Payer: Self-pay

## 2023-03-13 ENCOUNTER — Other Ambulatory Visit: Payer: Self-pay | Admitting: Nurse Practitioner

## 2023-03-13 ENCOUNTER — Ambulatory Visit: Payer: Commercial Managed Care - PPO | Admitting: Hematology

## 2023-03-13 LAB — CA 125: Cancer Antigen (CA) 125: 62.9 U/mL — ABNORMAL HIGH (ref 0.0–38.1)

## 2023-03-13 MED ORDER — ONDANSETRON HCL 8 MG PO TABS
8.0000 mg | ORAL_TABLET | Freq: Three times a day (TID) | ORAL | 1 refills | Status: DC | PRN
  Filled 2023-03-13: qty 20, 7d supply, fill #0
  Filled 2023-07-24: qty 20, 7d supply, fill #1

## 2023-03-13 NOTE — H&P (View-Only) (Signed)
Gynecologic Oncology Return Clinic Visit  03/14/23  Reason for Visit: treatment planning, pelvic mass  Treatment History: Oncology History Overview Note  Cancer Staging GIST (gastrointestinal stromal tumor) of small bowel, malignant (HCC) Staging form: Gastrointestinal Stromal Tumor - Small Intestinal, Esophageal, Colorectal, Mesenteric, and Peritoneal GIST, AJCC 8th Edition - Pathologic stage from 04/03/2021: Stage IIIB (pT4, pN0, cM0, Mitotic Rate: High) - Signed by Malachy Mood, MD on 04/23/2021  Malignant neoplasm of upper-outer quadrant of left breast in female, estrogen receptor positive (HCC) Staging form: Breast, AJCC 8th Edition - Clinical stage from 03/20/2018: Stage IA (cT1c, cN0, cM0, G2, ER+, PR+, HER2-) - Signed by Loa Socks, NP on 05/27/2018 - Pathologic stage from 05/07/2018: Stage IA (pT1c, pN0, cM0, G1, ER+, PR+, HER2-, Oncotype DX score: 11) - Signed by Loa Socks, NP on 05/27/2018    Malignant neoplasm of upper-outer quadrant of left breast in female, estrogen receptor positive (HCC)  03/20/2018 Initial Diagnosis   Screening detected left breast spiculated mass by ultrasound measured 1.2 cm.  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  This variant was reclassified from a VUS to pathogenic on June 20, 2021.  The Common Hereditary Cancer Panel offered by [facility] includes sequencing and/or deletion duplication testing of the following 47 genes: APC, ATM, AXIN2, BARD1, BMPR1A, BRCA1, BRCA2, BRIP1, CDH1, CDKN2A (p14ARF), CDKN2A (p16INK4a), CKD4, CHEK2,  CTNNA1, DICER1, EPCAM (Deletion/duplication testing only), GREM1 (promoter region deletion/duplication testing only), KIT, MEN1, MLH1, MSH2, MSH3, MSH6, MUTYH, NBN, NF1, NHTL1, PALB2, PDGFRA, PMS2, POLD1, POLE, PTEN, RAD50, RAD51C, RAD51D, SDHB, SDHC, SDHD, SMAD4, SMARCA4. STK11, TP53, TSC1, TSC2, and VHL.  The following genes were evaluated for sequence changes only: SDHA and HOXB13 c.251G>A variant only.  Results: No pathogenic variants identified.  A Variant of uncertain significance in the gene NF1 was detected c.2325G>A (Silent). The date of this test report is 04/03/2018.    05/07/2018 Surgery   Left lumpectomy: IDC grade 1, 1.2 cm, intermediate grade DCIS, margins negative,PASH, 0/7 sentinel lymph nodes negative, ER 95%, PR 95%, Ki-67 1%, HER-2 negative ratio 1.47, T1CN0 stage Ia   05/07/2018 Cancer Staging   Staging form: Breast, AJCC 8th Edition - Pathologic stage from 05/07/2018: Stage IA (pT1c, pN0, cM0, G1, ER+, PR+, HER2-, Oncotype DX score: 11) - Signed by Loa Socks, NP on 05/27/2018   05/25/2018 Oncotype testing   Oncotype DX score 11: 3% risk of distant recurrence of 9 years, low risk   06/24/2018 - 08/03/2018 Radiation Therapy   Adjuvant radiation therapy   09/2018 -  Anti-estrogen oral therapy   Tamoxifen daily   GIST (gastrointestinal stromal tumor) of small bowel, malignant (HCC)  04/03/2021 Cancer Staging   Staging form: Gastrointestinal Stromal Tumor - Small Intestinal, Esophageal, Colorectal, Mesenteric, and Peritoneal GIST, AJCC 8th Edition - Pathologic stage from 04/03/2021: Stage IIIB (pT4, pN0, cM0, Mitotic Rate: High) - Signed by Malachy Mood, MD on 04/23/2021 Stage prefix: Initial diagnosis Histologic grade (G): High grade Histologic grading system: 2 grade system Residual tumor (R): R0 - None   04/23/2021 Initial Diagnosis   GIST (gastrointestinal stromal tumor)  of small bowel, malignant (HCC)   05/15/2021 Imaging   CT Chest w/o contrast  IMPRESSION: 1. 6  mm nodular opacity in the posterior right lower lobe with a similar size focus of architectural distortion/scarring in this region on the remote study from 17 years ago, suggesting that this is simply scar although the finding is more confluent on today's study. While almost assuredly benign, consider follow-up CT chest in 6 months to ensure stability. 2. No evidence for metastatic disease in the chest.   08/21/2021 Imaging   EXAM: CT ABDOMEN AND PELVIS WITH CONTRAST  IMPRESSION: Interval surgical resection of large left abdominal cystic mass since prior exam. No evidence of tumor recurrence or metastatic disease within the abdomen or pelvis.   8 mm low-attenuation lesion in pancreatic head, not definitely seen on prior exams. Consider further characterization with MRI, or continued surveillance on follow-up CT.   Small posterior uterine fibroid.   08/23/2021 Imaging   EXAM: MRI ABDOMEN WITHOUT AND WITH CONTRAST  IMPRESSION: 1. No evidence of pancreatic mass or suspicious correlate for the CT abnormality, which may have been artifactual or possibly related to heterogeneous fatty replacement. 2. No acute process or evidence of metastatic disease in the abdomen. 3.  Aortic Atherosclerosis (ICD10-I70.0).    Recent CT for surveillance in the setting of GIST on treatment showed a new 8 cm adnexal mass.  Interval History: Developed some right-sided pelvic pain and hip pain starting on Saturday after doing some offloading.  Pain has been intermittent since then, worse at night when she is not at work.  Is using tramadol as needed at night, over-the-counter medications for pain during the day if needed.  Describes the pain as dull although has periods of sharp pain and feels sometimes as if something is twisting.  Reports bowel function is at baseline, uses Senokot-S if needed.  Denies any urinary symptoms.  Denies any bleeding or discharge.  Past Medical/Surgical History: Past Medical  History:  Diagnosis Date   Allergy    Anemia    Anxiety    Breast cancer (HCC) 05/07/2018   left invasive ductal    Cancer (HCC)    Chest pain    Depression    Family history of breast cancer    Fibromyalgia    neurofibromatosis   H/O: depression    Hx of migraines    Mitral regurgitation    mild per echo EF 55-60%   Neurofibromatosis (HCC)    Palpitations    Personal history of radiation therapy 08/10/2018   left breast 06/24/2018-08/10/2018  Dr Antony Blackbird   Tachycardia    Hx. of   Tachycardia    Wears contact lenses     Past Surgical History:  Procedure Laterality Date   BOWEL RESECTION N/A 04/03/2021   Procedure: SMALL BOWEL RESECTION;  Surgeon: [name], MD;  Location: [facility];  Service: General;  Laterality: N/A;   BREAST BIOPSY     BREAST LUMPECTOMY Left 05/07/2018   Invasive ductal    BREAST LUMPECTOMY WITH RADIOACTIVE SEED AND SENTINEL LYMPH NODE BIOPSY Left 05/07/2018   Procedure: BREAST LUMPECTOMY WITH RADIOACTIVE SEED AND SENTINEL LYMPH NODE BIOPSY;  Surgeon: Emelia Loron, MD;  Location: [facility];  Service: General;  Laterality: Left;   CESAREAN SECTION     CHOLECYSTECTOMY N/A 01/04/2015   Procedure: LAPAROSCOPIC CHOLECYSTECTOMY WITH INTRAOPERATIVE CHOLANGIOGRAM;  Surgeon: Harriette Bouillon, MD;  Location: [facility];  Service: General;  Laterality: N/A;   COLONOSCOPY  ESOPHAGOGASTRODUODENOSCOPY     LAPAROTOMY N/A 04/03/2021   Procedure: EXPLORATORY LAPAROTOMY WITH RESECTION OF ABDOMINAL MASS;  Surgeon: [name], MD;  Location: [facility];  Service: General;  Laterality: N/A;   MASTOPEXY Bilateral 09/05/2020   Procedure: BILATERAL BREAST MASTOPEXY;  Surgeon: Glenna Fellows, MD;  Location: [facility];  Service: Plastics;  Laterality: Bilateral;   OMENTECTOMY N/A 04/03/2021   Procedure: OMENTECTOMY;  Surgeon: [name], MD;  Location: [facility];  Service: General;  Laterality: N/A;   WISDOM TOOTH EXTRACTION       Family History  Problem Relation Age of Onset   Breast cancer Mother 57       estrogen negative, recurred in 2012   Diabetes Mother    Neurofibromatosis Father    Atrial fibrillation Father    Heart disease Paternal Uncle    Diabetes Paternal Uncle    Atrial fibrillation Maternal Grandfather    Cancer Paternal Grandmother        type unk, died at 54   Neurofibromatosis Paternal Grandmother    Stroke Paternal Grandfather    Heart attack Neg Hx    Hypertension Neg Hx    Colon cancer Neg Hx    Colon polyps Neg Hx    Esophageal cancer Neg Hx    Rectal cancer Neg Hx    Stomach cancer Neg Hx     Social History   Socioeconomic History   Marital status: Married    Spouse name: Not on file   Number of children: 1   Years of education: Not on file   Highest education level: Not on file  Occupational History   Occupation: [employer]  Tobacco Use   Smoking status: Never   Smokeless tobacco: Never  Vaping Use   Vaping Use: Never used  Substance and Sexual Activity   Alcohol use: Not Currently    Alcohol/week: 0.0 - 1.0 standard drinks of alcohol    Comment: occasional but rarely   Drug use: No   Sexual activity: Yes    Birth control/protection: None  Other Topics Concern   Not on file  Social History Narrative   Pt lives with husband and daughter   Social Determinants of Health   Financial Resource Strain: Not on file  Food Insecurity: Not on file  Transportation Needs: Not on file  Physical Activity: Not on file  Stress: Not on file  Social Connections: Not on file    Current Medications:  Current Outpatient Medications:    ALPRAZolam (XANAX) 0.5 MG tablet, Take 1/2 to 1 tablet by mouth 2 times a day as needed, Disp: 180 tablet, Rfl: 1   ALPRAZolam (XANAX) 0.5 MG tablet, Take 0.5-1 tablets (0.25-0.5 mg total) by mouth 2 (two) times daily as needed., Disp: 180 tablet, Rfl: 1   CALCIUM PO, Take 2 tablets by mouth daily at 6 (six) AM. GUMMY, Disp: , Rfl:     cetirizine (ZYRTEC) 10 MG tablet, Take 10 mg by mouth daily., Disp: , Rfl:    imatinib (GLEEVEC) 400 MG tablet, Take 1 tablet (400 mg total) by mouth daily. Take with meals and large glass of water.Caution:Chemotherapy., Disp: 30 tablet, Rfl: 3   metoprolol succinate (TOPROL-XL) 50 MG 24 hr tablet, Take 1 tablet (50 mg total) by mouth daily., Disp: 90 tablet, Rfl: 3   Multiple Vitamin (MULTIVITAMIN) tablet, Take 1 tablet by mouth daily., Disp: , Rfl:    ondansetron (ZOFRAN) 8 MG tablet, Take 1 tablet (8 mg) by mouth every 8  hours as needed for nausea or vomiting., Disp: 20 tablet, Rfl: 1   propranolol (INDERAL) 10 MG tablet, TAKE 1 TABLET BY MOUTH FOUR TIMES DAILY AS NEEDED FOR PALPITATIONS, Disp: 120 tablet, Rfl: 3   tamoxifen (NOLVADEX) 20 MG tablet, TAKE 1 TABLET BY MOUTH ONCE A DAY, Disp: 90 tablet, Rfl: 3   traMADol (ULTRAM) 50 MG tablet, Take 1 tablet (50 mg total) by mouth every 6 (six) hours as needed., Disp: 30 tablet, Rfl: 0   venlafaxine XR (EFFEXOR-XR) 75 MG 24 hr capsule, Take 1 capsule (75 mg total) by mouth daily., Disp: 90 capsule, Rfl: 1  Review of Systems: Denies appetite changes, fevers, chills, fatigue, unexplained weight changes. Denies hearing loss, neck lumps or masses, mouth sores, ringing in ears or voice changes. Denies cough or wheezing.  Denies shortness of breath. Denies chest pain or palpitations. Denies leg swelling. Denies abdominal distention, pain, blood in stools, constipation, diarrhea, nausea, vomiting, or early satiety. Denies pain with intercourse, dysuria, frequency, hematuria or incontinence. Denies hot flashes, pelvic pain, vaginal bleeding or vaginal discharge.   Denies joint pain, back pain or muscle pain/cramps. Denies itching, rash, or wounds. Denies dizziness, headaches, numbness or seizures. Denies swollen lymph nodes or glands, denies easy bruising or bleeding. Denies anxiety, depression, confusion, or decreased concentration.  Physical  Exam: BP (!) 106/52 (BP Location: Right Arm, Patient Position: Sitting, Cuff Size: Normal)   Pulse 77   Temp 98.9 F (37.2 C) (Oral)   Resp 18   Ht 5\' 2"  (1.575 m)   Wt 137 lb (62.1 kg)   SpO2 100%   BMI 25.06 kg/m  General: Alert, oriented, no acute distress. HEENT: Atraumatic, normocephalic, sclera anicteric. Chest: Clear to auscultation bilaterally.  No wheezes or rhonchi. Cardiovascular: Regular rate and rhythm, no murmurs. Abdomen: soft, some tenderness with deep palpation in the right lower quadrant.  Normoactive bowel sounds.  No hepatosplenomegaly appreciated.  Fullness appreciated in the right lower quadrant.  Well-healed scar. Extremities: Grossly normal range of motion.  Warm, well perfused.  No edema bilaterally. Skin: No rashes or lesions noted. Lymphatics: No cervical, supraclavicular, or inguinal adenopathy. GU: Normal appearing external genitalia without erythema, excoriation, or lesions.  Speculum exam reveals mildly atrophic vaginal mucosa, normal-appearing cervix.  Bimanual exam reveals mildly enlarged uterus, mobile.  There is a mass that seems to move separate from the uterus measuring approximately 8 cm within the right pelvis, smooth, no nodularity appreciated.  Water at tenderness to palpation mass.  Rectovaginal exam deferred.  Laboratory & Radiologic Studies: CT A/P on 6/18:  New heterogeneously enhancing right adnexal mass measures 8.1 x 5.9 cm and closely abuts the sigmoid colon. Differential in this patient with history of neurofibromatosis includes serous ovarian carcinoma, leiomyosarcoma, or GIST. Stable peritoneal nodules and L1 vertebral body sclerotic focus.  MRI pelvis 6/25:  8.2 cm right adnexal mass, which is contiguous with the right ovary and lateral wall of the uterus, and new since CT on 09/04/2022. This is highly suspicious for malignancy, with differential diagnosis including metastatic disease, malignant peripheral nerve sheath tumor,  leiomyosarcoma, and less likely ovarian carcinoma.   Small amount of pelvic ascites with peritoneal enhancement, raising suspicion for peritoneal carcinomatosis. 2 cm posterior uterine fibroid.  6/26: CEA - 1.73 CA-125 - 62.9  Assessment & Plan: Anne Horton is a 47 y.o. woman with a history of neurofibromatosis, early stage ER+ breast cancer (treated with surgery/RT/tamoxifen), and GIST of small bowel (stage IIIB, high grade, s/p surgery in 02/2021  on Imatinib since) with new complex pelvic mass on imaging and abdominal pain.  I discussed recent imaging findings with the patient and her husband.  We looked at CT and MRI images together.  Unfortunately, imaging does not definitively differentiate whether this is a mass arising from her ovary or from the small bowel within the pelvis.  Discussed differential diagnosis including recurrent GIST tumor/metastatic GIST, primary ovarian tumor (borderline or malignant).  I have reviewed the patient's imaging with Dr. Donell Beers and we agree that best course of action is to proceed with planned joint surgery.  This has already been scheduled for 7/24.  We will attempt diagnostic laparoscopy and if feasible plan for robotic resection of the mass and other indicated procedures.  Otherwise, surgery will be completed via laparotomy.  Discussed recent tumor markers which showed a normal CEA, mildly elevated CA125.  CA125 is similar to what it was back at the time of her GIST diagnosis.  In the event that this is a recurrent GIST tumor and does not involve her right adnexa, we discussed plan related to her pelvic organs.  I think it would be very reasonable to proceed with bilateral salpingo-oophorectomy given her age as well as her estrogen receptor positive breast cancer.  This would also allow her to transition from tamoxifen to an aromatase inhibitor.  If ovarian borderline tumor or malignancy encountered, we discussed additional procedures of total  hysterectomy, removal of any mating omentum, peritoneal biopsies, and possible lymphadenectomy.  We reviewed the plan for a diagnostic laparoscopy, open vs robotic mass excision, bilateral salpingo-oophorectomy, possible total hysterectomy, possible staging including lymph node dissection. The risks of surgery were discussed in detail and she understands these to include infection; wound separation; hernia; vaginal cuff separation, injury to adjacent organs such as bowel, bladder, blood vessels, ureters and nerves; bleeding which may require blood transfusion; anesthesia risk; thromboembolic events; possible death; unforeseen complications; possible need for re-exploration; medical complications such as heart attack, stroke, pleural effusion and pneumonia; and, if full lymphadenectomy is performed the risk of lymphedema and lymphocyst. The patient will receive DVT and antibiotic prophylaxis as indicated. She voiced a clear understanding. She had the opportunity to ask questions.   Plan for chest xray given chronic cough.  Patient will return for a preoperative visit with Warner Mccreedy or Renaldo Reel after her visit with Dr. Donell Beers and closer to the date of surgery.  45 minutes of total time was spent for this patient encounter, including preparation, face-to-face counseling with the patient and coordination of care, and documentation of the encounter.  Eugene Garnet, MD  Division of Gynecologic Oncology  Department of Obstetrics and Gynecology  [facility]

## 2023-03-13 NOTE — Progress Notes (Signed)
Gynecologic Oncology Return Clinic Visit  03/14/23  Reason for Visit: treatment planning, pelvic mass  Treatment History: Oncology History Overview Note  Cancer Staging GIST (gastrointestinal stromal tumor) of small bowel, malignant (HCC) Staging form: Gastrointestinal Stromal Tumor - Small Intestinal, Esophageal, Colorectal, Mesenteric, and Peritoneal GIST, AJCC 8th Edition - Pathologic stage from 04/03/2021: Stage IIIB (pT4, pN0, cM0, Mitotic Rate: High) - Signed by Malachy Mood, MD on 04/23/2021  Malignant neoplasm of upper-outer quadrant of left breast in female, estrogen receptor positive (HCC) Staging form: Breast, AJCC 8th Edition - Clinical stage from 03/20/2018: Stage IA (cT1c, cN0, cM0, G2, ER+, PR+, HER2-) - Signed by Loa Socks, NP on 05/27/2018 - Pathologic stage from 05/07/2018: Stage IA (pT1c, pN0, cM0, G1, ER+, PR+, HER2-, Oncotype DX score: 11) - Signed by Loa Socks, NP on 05/27/2018    Malignant neoplasm of upper-outer quadrant of left breast in female, estrogen receptor positive (HCC)  03/20/2018 Initial Diagnosis   Screening detected left breast spiculated mass by ultrasound measured 1.2 cm.  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  This variant was reclassified from a VUS to pathogenic on June 20, 2021.  The Common Hereditary Cancer Panel offered by [facility] includes sequencing and/or deletion duplication testing of the following 47 genes: APC, ATM, AXIN2, BARD1, BMPR1A, BRCA1, BRCA2, BRIP1, CDH1, CDKN2A (p14ARF), CDKN2A (p16INK4a), CKD4, CHEK2,  CTNNA1, DICER1, EPCAM (Deletion/duplication testing only), GREM1 (promoter region deletion/duplication testing only), KIT, MEN1, MLH1, MSH2, MSH3, MSH6, MUTYH, NBN, NF1, NHTL1, PALB2, PDGFRA, PMS2, POLD1, POLE, PTEN, RAD50, RAD51C, RAD51D, SDHB, SDHC, SDHD, SMAD4, SMARCA4. STK11, TP53, TSC1, TSC2, and VHL.  The following genes were evaluated for sequence changes only: SDHA and HOXB13 c.251G>A variant only.  Results: No pathogenic variants identified.  A Variant of uncertain significance in the gene NF1 was detected c.2325G>A (Silent). The date of this test report is 04/03/2018.    05/07/2018 Surgery   Left lumpectomy: IDC grade 1, 1.2 cm, intermediate grade DCIS, margins negative,PASH, 0/7 sentinel lymph nodes negative, ER 95%, PR 95%, Ki-67 1%, HER-2 negative ratio 1.47, T1CN0 stage Ia   05/07/2018 Cancer Staging   Staging form: Breast, AJCC 8th Edition - Pathologic stage from 05/07/2018: Stage IA (pT1c, pN0, cM0, G1, ER+, PR+, HER2-, Oncotype DX score: 11) - Signed by Loa Socks, NP on 05/27/2018   05/25/2018 Oncotype testing   Oncotype DX score 11: 3% risk of distant recurrence of 9 years, low risk   06/24/2018 - 08/03/2018 Radiation Therapy   Adjuvant radiation therapy   09/2018 -  Anti-estrogen oral therapy   Tamoxifen daily   GIST (gastrointestinal stromal tumor) of small bowel, malignant (HCC)  04/03/2021 Cancer Staging   Staging form: Gastrointestinal Stromal Tumor - Small Intestinal, Esophageal, Colorectal, Mesenteric, and Peritoneal GIST, AJCC 8th Edition - Pathologic stage from 04/03/2021: Stage IIIB (pT4, pN0, cM0, Mitotic Rate: High) - Signed by Malachy Mood, MD on 04/23/2021 Stage prefix: Initial diagnosis Histologic grade (G): High grade Histologic grading system: 2 grade system Residual tumor (R): R0 - None   04/23/2021 Initial Diagnosis   GIST (gastrointestinal stromal tumor)  of small bowel, malignant (HCC)   05/15/2021 Imaging   CT Chest w/o contrast  IMPRESSION: 1. 6  mm nodular opacity in the posterior right lower lobe with a similar size focus of architectural distortion/scarring in this region on the remote study from 17 years ago, suggesting that this is simply scar although the finding is more confluent on today's study. While almost assuredly benign, consider follow-up CT chest in 6 months to ensure stability. 2. No evidence for metastatic disease in the chest.   08/21/2021 Imaging   EXAM: CT ABDOMEN AND PELVIS WITH CONTRAST  IMPRESSION: Interval surgical resection of large left abdominal cystic mass since prior exam. No evidence of tumor recurrence or metastatic disease within the abdomen or pelvis.   8 mm low-attenuation lesion in pancreatic head, not definitely seen on prior exams. Consider further characterization with MRI, or continued surveillance on follow-up CT.   Small posterior uterine fibroid.   08/23/2021 Imaging   EXAM: MRI ABDOMEN WITHOUT AND WITH CONTRAST  IMPRESSION: 1. No evidence of pancreatic mass or suspicious correlate for the CT abnormality, which may have been artifactual or possibly related to heterogeneous fatty replacement. 2. No acute process or evidence of metastatic disease in the abdomen. 3.  Aortic Atherosclerosis (ICD10-I70.0).    Recent CT for surveillance in the setting of GIST on treatment showed a new 8 cm adnexal mass.  Interval History: Developed some right-sided pelvic pain and hip pain starting on Saturday after doing some offloading.  Pain has been intermittent since then, worse at night when she is not at work.  Is using tramadol as needed at night, over-the-counter medications for pain during the day if needed.  Describes the pain as dull although has periods of sharp pain and feels sometimes as if something is twisting.  Reports bowel function is at baseline, uses Senokot-S if needed.  Denies any urinary symptoms.  Denies any bleeding or discharge.  Past Medical/Surgical History: Past Medical  History:  Diagnosis Date   Allergy    Anemia    Anxiety    Breast cancer (HCC) 05/07/2018   left invasive ductal    Cancer (HCC)    Chest pain    Depression    Family history of breast cancer    Fibromyalgia    neurofibromatosis   H/O: depression    Hx of migraines    Mitral regurgitation    mild per echo EF 55-60%   Neurofibromatosis (HCC)    Palpitations    Personal history of radiation therapy 08/10/2018   left breast 06/24/2018-08/10/2018  Dr Antony Blackbird   Tachycardia    Hx. of   Tachycardia    Wears contact lenses     Past Surgical History:  Procedure Laterality Date   BOWEL RESECTION N/A 04/03/2021   Procedure: SMALL BOWEL RESECTION;  Surgeon: [name], MD;  Location: [facility];  Service: General;  Laterality: N/A;   BREAST BIOPSY     BREAST LUMPECTOMY Left 05/07/2018   Invasive ductal    BREAST LUMPECTOMY WITH RADIOACTIVE SEED AND SENTINEL LYMPH NODE BIOPSY Left 05/07/2018   Procedure: BREAST LUMPECTOMY WITH RADIOACTIVE SEED AND SENTINEL LYMPH NODE BIOPSY;  Surgeon: Emelia Loron, MD;  Location: [facility];  Service: General;  Laterality: Left;   CESAREAN SECTION     CHOLECYSTECTOMY N/A 01/04/2015   Procedure: LAPAROSCOPIC CHOLECYSTECTOMY WITH INTRAOPERATIVE CHOLANGIOGRAM;  Surgeon: Harriette Bouillon, MD;  Location: [facility];  Service: General;  Laterality: N/A;   COLONOSCOPY  ESOPHAGOGASTRODUODENOSCOPY     LAPAROTOMY N/A 04/03/2021   Procedure: EXPLORATORY LAPAROTOMY WITH RESECTION OF ABDOMINAL MASS;  Surgeon: [name], MD;  Location: [facility];  Service: General;  Laterality: N/A;   MASTOPEXY Bilateral 09/05/2020   Procedure: BILATERAL BREAST MASTOPEXY;  Surgeon: Glenna Fellows, MD;  Location: [facility];  Service: Plastics;  Laterality: Bilateral;   OMENTECTOMY N/A 04/03/2021   Procedure: OMENTECTOMY;  Surgeon: [name], MD;  Location: [facility];  Service: General;  Laterality: N/A;   WISDOM TOOTH EXTRACTION       Family History  Problem Relation Age of Onset   Breast cancer Mother 57       estrogen negative, recurred in 2012   Diabetes Mother    Neurofibromatosis Father    Atrial fibrillation Father    Heart disease Paternal Uncle    Diabetes Paternal Uncle    Atrial fibrillation Maternal Grandfather    Cancer Paternal Grandmother        type unk, died at 54   Neurofibromatosis Paternal Grandmother    Stroke Paternal Grandfather    Heart attack Neg Hx    Hypertension Neg Hx    Colon cancer Neg Hx    Colon polyps Neg Hx    Esophageal cancer Neg Hx    Rectal cancer Neg Hx    Stomach cancer Neg Hx     Social History   Socioeconomic History   Marital status: Married    Spouse name: Not on file   Number of children: 1   Years of education: Not on file   Highest education level: Not on file  Occupational History   Occupation: [employer]  Tobacco Use   Smoking status: Never   Smokeless tobacco: Never  Vaping Use   Vaping Use: Never used  Substance and Sexual Activity   Alcohol use: Not Currently    Alcohol/week: 0.0 - 1.0 standard drinks of alcohol    Comment: occasional but rarely   Drug use: No   Sexual activity: Yes    Birth control/protection: None  Other Topics Concern   Not on file  Social History Narrative   Pt lives with husband and daughter   Social Determinants of Health   Financial Resource Strain: Not on file  Food Insecurity: Not on file  Transportation Needs: Not on file  Physical Activity: Not on file  Stress: Not on file  Social Connections: Not on file    Current Medications:  Current Outpatient Medications:    ALPRAZolam (XANAX) 0.5 MG tablet, Take 1/2 to 1 tablet by mouth 2 times a day as needed, Disp: 180 tablet, Rfl: 1   ALPRAZolam (XANAX) 0.5 MG tablet, Take 0.5-1 tablets (0.25-0.5 mg total) by mouth 2 (two) times daily as needed., Disp: 180 tablet, Rfl: 1   CALCIUM PO, Take 2 tablets by mouth daily at 6 (six) AM. GUMMY, Disp: , Rfl:     cetirizine (ZYRTEC) 10 MG tablet, Take 10 mg by mouth daily., Disp: , Rfl:    imatinib (GLEEVEC) 400 MG tablet, Take 1 tablet (400 mg total) by mouth daily. Take with meals and large glass of water.Caution:Chemotherapy., Disp: 30 tablet, Rfl: 3   metoprolol succinate (TOPROL-XL) 50 MG 24 hr tablet, Take 1 tablet (50 mg total) by mouth daily., Disp: 90 tablet, Rfl: 3   Multiple Vitamin (MULTIVITAMIN) tablet, Take 1 tablet by mouth daily., Disp: , Rfl:    ondansetron (ZOFRAN) 8 MG tablet, Take 1 tablet (8 mg) by mouth every 8  hours as needed for nausea or vomiting., Disp: 20 tablet, Rfl: 1   propranolol (INDERAL) 10 MG tablet, TAKE 1 TABLET BY MOUTH FOUR TIMES DAILY AS NEEDED FOR PALPITATIONS, Disp: 120 tablet, Rfl: 3   tamoxifen (NOLVADEX) 20 MG tablet, TAKE 1 TABLET BY MOUTH ONCE A DAY, Disp: 90 tablet, Rfl: 3   traMADol (ULTRAM) 50 MG tablet, Take 1 tablet (50 mg total) by mouth every 6 (six) hours as needed., Disp: 30 tablet, Rfl: 0   venlafaxine XR (EFFEXOR-XR) 75 MG 24 hr capsule, Take 1 capsule (75 mg total) by mouth daily., Disp: 90 capsule, Rfl: 1  Review of Systems: Denies appetite changes, fevers, chills, fatigue, unexplained weight changes. Denies hearing loss, neck lumps or masses, mouth sores, ringing in ears or voice changes. Denies cough or wheezing.  Denies shortness of breath. Denies chest pain or palpitations. Denies leg swelling. Denies abdominal distention, pain, blood in stools, constipation, diarrhea, nausea, vomiting, or early satiety. Denies pain with intercourse, dysuria, frequency, hematuria or incontinence. Denies hot flashes, pelvic pain, vaginal bleeding or vaginal discharge.   Denies joint pain, back pain or muscle pain/cramps. Denies itching, rash, or wounds. Denies dizziness, headaches, numbness or seizures. Denies swollen lymph nodes or glands, denies easy bruising or bleeding. Denies anxiety, depression, confusion, or decreased concentration.  Physical  Exam: BP (!) 106/52 (BP Location: Right Arm, Patient Position: Sitting, Cuff Size: Normal)   Pulse 77   Temp 98.9 F (37.2 C) (Oral)   Resp 18   Ht 5\' 2"  (1.575 m)   Wt 137 lb (62.1 kg)   SpO2 100%   BMI 25.06 kg/m  General: Alert, oriented, no acute distress. HEENT: Atraumatic, normocephalic, sclera anicteric. Chest: Clear to auscultation bilaterally.  No wheezes or rhonchi. Cardiovascular: Regular rate and rhythm, no murmurs. Abdomen: soft, some tenderness with deep palpation in the right lower quadrant.  Normoactive bowel sounds.  No hepatosplenomegaly appreciated.  Fullness appreciated in the right lower quadrant.  Well-healed scar. Extremities: Grossly normal range of motion.  Warm, well perfused.  No edema bilaterally. Skin: No rashes or lesions noted. Lymphatics: No cervical, supraclavicular, or inguinal adenopathy. GU: Normal appearing external genitalia without erythema, excoriation, or lesions.  Speculum exam reveals mildly atrophic vaginal mucosa, normal-appearing cervix.  Bimanual exam reveals mildly enlarged uterus, mobile.  There is a mass that seems to move separate from the uterus measuring approximately 8 cm within the right pelvis, smooth, no nodularity appreciated.  Water at tenderness to palpation mass.  Rectovaginal exam deferred.  Laboratory & Radiologic Studies: CT A/P on 6/18:  New heterogeneously enhancing right adnexal mass measures 8.1 x 5.9 cm and closely abuts the sigmoid colon. Differential in this patient with history of neurofibromatosis includes serous ovarian carcinoma, leiomyosarcoma, or GIST. Stable peritoneal nodules and L1 vertebral body sclerotic focus.  MRI pelvis 6/25:  8.2 cm right adnexal mass, which is contiguous with the right ovary and lateral wall of the uterus, and new since CT on 09/04/2022. This is highly suspicious for malignancy, with differential diagnosis including metastatic disease, malignant peripheral nerve sheath tumor,  leiomyosarcoma, and less likely ovarian carcinoma.   Small amount of pelvic ascites with peritoneal enhancement, raising suspicion for peritoneal carcinomatosis. 2 cm posterior uterine fibroid.  6/26: CEA - 1.73 CA-125 - 62.9  Assessment & Plan: Anne Horton is a 47 y.o. woman with a history of neurofibromatosis, early stage ER+ breast cancer (treated with surgery/RT/tamoxifen), and GIST of small bowel (stage IIIB, high grade, s/p surgery in 02/2021  on Imatinib since) with new complex pelvic mass on imaging and abdominal pain.  I discussed recent imaging findings with the patient and her husband.  We looked at CT and MRI images together.  Unfortunately, imaging does not definitively differentiate whether this is a mass arising from her ovary or from the small bowel within the pelvis.  Discussed differential diagnosis including recurrent GIST tumor/metastatic GIST, primary ovarian tumor (borderline or malignant).  I have reviewed the patient's imaging with Dr. Donell Beers and we agree that best course of action is to proceed with planned joint surgery.  This has already been scheduled for 7/24.  We will attempt diagnostic laparoscopy and if feasible plan for robotic resection of the mass and other indicated procedures.  Otherwise, surgery will be completed via laparotomy.  Discussed recent tumor markers which showed a normal CEA, mildly elevated CA125.  CA125 is similar to what it was back at the time of her GIST diagnosis.  In the event that this is a recurrent GIST tumor and does not involve her right adnexa, we discussed plan related to her pelvic organs.  I think it would be very reasonable to proceed with bilateral salpingo-oophorectomy given her age as well as her estrogen receptor positive breast cancer.  This would also allow her to transition from tamoxifen to an aromatase inhibitor.  If ovarian borderline tumor or malignancy encountered, we discussed additional procedures of total  hysterectomy, removal of any mating omentum, peritoneal biopsies, and possible lymphadenectomy.  We reviewed the plan for a diagnostic laparoscopy, open vs robotic mass excision, bilateral salpingo-oophorectomy, possible total hysterectomy, possible staging including lymph node dissection. The risks of surgery were discussed in detail and she understands these to include infection; wound separation; hernia; vaginal cuff separation, injury to adjacent organs such as bowel, bladder, blood vessels, ureters and nerves; bleeding which may require blood transfusion; anesthesia risk; thromboembolic events; possible death; unforeseen complications; possible need for re-exploration; medical complications such as heart attack, stroke, pleural effusion and pneumonia; and, if full lymphadenectomy is performed the risk of lymphedema and lymphocyst. The patient will receive DVT and antibiotic prophylaxis as indicated. She voiced a clear understanding. She had the opportunity to ask questions.   Plan for chest xray given chronic cough.  Patient will return for a preoperative visit with Warner Mccreedy or Renaldo Reel after her visit with Dr. Donell Beers and closer to the date of surgery.  45 minutes of total time was spent for this patient encounter, including preparation, face-to-face counseling with the patient and coordination of care, and documentation of the encounter.  Eugene Garnet, MD  Division of Gynecologic Oncology  Department of Obstetrics and Gynecology  [facility]

## 2023-03-14 ENCOUNTER — Encounter: Payer: Self-pay | Admitting: Gynecologic Oncology

## 2023-03-14 ENCOUNTER — Inpatient Hospital Stay (HOSPITAL_BASED_OUTPATIENT_CLINIC_OR_DEPARTMENT_OTHER): Payer: Commercial Managed Care - PPO | Admitting: Gynecologic Oncology

## 2023-03-14 VITALS — BP 106/52 | HR 77 | Temp 98.9°F | Resp 18 | Ht 62.0 in | Wt 137.0 lb

## 2023-03-14 DIAGNOSIS — R1900 Intra-abdominal and pelvic swelling, mass and lump, unspecified site: Secondary | ICD-10-CM | POA: Diagnosis not present

## 2023-03-14 DIAGNOSIS — I700 Atherosclerosis of aorta: Secondary | ICD-10-CM | POA: Diagnosis not present

## 2023-03-14 DIAGNOSIS — D259 Leiomyoma of uterus, unspecified: Secondary | ICD-10-CM | POA: Diagnosis not present

## 2023-03-14 DIAGNOSIS — Z803 Family history of malignant neoplasm of breast: Secondary | ICD-10-CM | POA: Diagnosis not present

## 2023-03-14 DIAGNOSIS — R052 Subacute cough: Secondary | ICD-10-CM

## 2023-03-14 DIAGNOSIS — Z79899 Other long term (current) drug therapy: Secondary | ICD-10-CM | POA: Diagnosis not present

## 2023-03-14 DIAGNOSIS — C49A3 Gastrointestinal stromal tumor of small intestine: Secondary | ICD-10-CM

## 2023-03-14 DIAGNOSIS — Z9049 Acquired absence of other specified parts of digestive tract: Secondary | ICD-10-CM | POA: Diagnosis not present

## 2023-03-14 DIAGNOSIS — N9489 Other specified conditions associated with female genital organs and menstrual cycle: Secondary | ICD-10-CM

## 2023-03-14 DIAGNOSIS — C50412 Malignant neoplasm of upper-outer quadrant of left female breast: Secondary | ICD-10-CM

## 2023-03-14 DIAGNOSIS — Q8500 Neurofibromatosis, unspecified: Secondary | ICD-10-CM

## 2023-03-14 DIAGNOSIS — M25551 Pain in right hip: Secondary | ICD-10-CM | POA: Diagnosis not present

## 2023-03-14 DIAGNOSIS — R102 Pelvic and perineal pain: Secondary | ICD-10-CM | POA: Diagnosis not present

## 2023-03-14 DIAGNOSIS — Z170 Estrogen receptor positive status [ER+]: Secondary | ICD-10-CM | POA: Diagnosis not present

## 2023-03-14 NOTE — Patient Instructions (Signed)
We will get you set up for preop visit with either Clydie Braun or Efraim Kaufmann once you have seen Dr. Donell Beers.

## 2023-03-15 ENCOUNTER — Other Ambulatory Visit: Payer: Self-pay | Admitting: General Surgery

## 2023-03-17 ENCOUNTER — Encounter: Payer: Self-pay | Admitting: Oncology

## 2023-03-17 NOTE — Progress Notes (Signed)
FMLA paperwork faxed successfully to [employer].

## 2023-03-18 ENCOUNTER — Encounter: Payer: Self-pay | Admitting: Gynecologic Oncology

## 2023-03-18 ENCOUNTER — Other Ambulatory Visit (HOSPITAL_COMMUNITY): Payer: Self-pay

## 2023-03-18 DIAGNOSIS — C49A3 Gastrointestinal stromal tumor of small intestine: Secondary | ICD-10-CM | POA: Diagnosis not present

## 2023-03-18 DIAGNOSIS — N9489 Other specified conditions associated with female genital organs and menstrual cycle: Secondary | ICD-10-CM | POA: Insufficient documentation

## 2023-03-18 DIAGNOSIS — C50412 Malignant neoplasm of upper-outer quadrant of left female breast: Secondary | ICD-10-CM | POA: Diagnosis not present

## 2023-03-18 DIAGNOSIS — Q8500 Neurofibromatosis, unspecified: Secondary | ICD-10-CM | POA: Diagnosis not present

## 2023-03-18 DIAGNOSIS — N83201 Unspecified ovarian cyst, right side: Secondary | ICD-10-CM | POA: Diagnosis not present

## 2023-03-18 DIAGNOSIS — Z170 Estrogen receptor positive status [ER+]: Secondary | ICD-10-CM | POA: Diagnosis not present

## 2023-03-18 MED ORDER — NEOMYCIN SULFATE 500 MG PO TABS
ORAL_TABLET | ORAL | 0 refills | Status: DC
  Filled 2023-03-18: qty 6, 3d supply, fill #0

## 2023-03-18 MED ORDER — POLYETHYLENE GLYCOL 3350 17 GM/SCOOP PO POWD
ORAL | 0 refills | Status: DC
  Filled 2023-03-18: qty 238, 1d supply, fill #0

## 2023-03-18 MED ORDER — BISACODYL 5 MG PO TBEC: DELAYED_RELEASE_TABLET | ORAL | 0 refills | Status: DC

## 2023-03-18 MED ORDER — METRONIDAZOLE 500 MG PO TABS
ORAL_TABLET | ORAL | 0 refills | Status: DC
  Filled 2023-03-18: qty 6, 3d supply, fill #0

## 2023-03-19 ENCOUNTER — Other Ambulatory Visit (HOSPITAL_COMMUNITY): Payer: Self-pay

## 2023-03-19 ENCOUNTER — Encounter: Payer: Self-pay | Admitting: Hematology and Oncology

## 2023-03-21 ENCOUNTER — Other Ambulatory Visit: Payer: Self-pay

## 2023-03-21 ENCOUNTER — Other Ambulatory Visit: Payer: Self-pay | Admitting: Gynecologic Oncology

## 2023-03-21 ENCOUNTER — Telehealth: Payer: Self-pay

## 2023-03-21 ENCOUNTER — Other Ambulatory Visit (HOSPITAL_COMMUNITY): Payer: Self-pay

## 2023-03-21 DIAGNOSIS — N9489 Other specified conditions associated with female genital organs and menstrual cycle: Secondary | ICD-10-CM

## 2023-03-21 MED ORDER — OXYCODONE HCL 5 MG PO TABS
5.0000 mg | ORAL_TABLET | ORAL | 0 refills | Status: DC | PRN
  Filled 2023-03-21: qty 20, 4d supply, fill #0

## 2023-03-21 NOTE — Telephone Encounter (Signed)
Faxed patients FMLA

## 2023-03-21 NOTE — Progress Notes (Signed)
Post-op pain med sent in preop. Pt has sennakot at home. Does not take ibup due to GIST hx

## 2023-03-26 ENCOUNTER — Inpatient Hospital Stay: Payer: Commercial Managed Care - PPO | Attending: Hematology | Admitting: Nurse Practitioner

## 2023-03-26 ENCOUNTER — Encounter: Payer: Self-pay | Admitting: Nurse Practitioner

## 2023-03-26 ENCOUNTER — Other Ambulatory Visit: Payer: Self-pay

## 2023-03-26 VITALS — BP 120/59 | HR 86 | Temp 98.8°F | Resp 17 | Wt 134.8 lb

## 2023-03-26 DIAGNOSIS — R110 Nausea: Secondary | ICD-10-CM | POA: Insufficient documentation

## 2023-03-26 DIAGNOSIS — Z9049 Acquired absence of other specified parts of digestive tract: Secondary | ICD-10-CM | POA: Diagnosis not present

## 2023-03-26 DIAGNOSIS — Z79899 Other long term (current) drug therapy: Secondary | ICD-10-CM | POA: Diagnosis not present

## 2023-03-26 DIAGNOSIS — Z170 Estrogen receptor positive status [ER+]: Secondary | ICD-10-CM | POA: Insufficient documentation

## 2023-03-26 DIAGNOSIS — I700 Atherosclerosis of aorta: Secondary | ICD-10-CM | POA: Insufficient documentation

## 2023-03-26 DIAGNOSIS — Z923 Personal history of irradiation: Secondary | ICD-10-CM | POA: Diagnosis not present

## 2023-03-26 DIAGNOSIS — C49A3 Gastrointestinal stromal tumor of small intestine: Secondary | ICD-10-CM | POA: Insufficient documentation

## 2023-03-26 DIAGNOSIS — Z79810 Long term (current) use of selective estrogen receptor modulators (SERMs): Secondary | ICD-10-CM | POA: Diagnosis not present

## 2023-03-26 DIAGNOSIS — C50412 Malignant neoplasm of upper-outer quadrant of left female breast: Secondary | ICD-10-CM | POA: Diagnosis not present

## 2023-03-26 DIAGNOSIS — R188 Other ascites: Secondary | ICD-10-CM | POA: Insufficient documentation

## 2023-03-26 DIAGNOSIS — Q8500 Neurofibromatosis, unspecified: Secondary | ICD-10-CM | POA: Diagnosis not present

## 2023-03-26 NOTE — Progress Notes (Signed)
Patient Care Team: Eden Lathe as PCP - General (Family Medicine) [name], Deloris Ping, MD as PCP - Cardiology (Cardiology) Antony Blackbird, MD as Consulting Physician (Radiation Oncology) Serena Croissant, MD as Consulting Physician (Hematology and Oncology) Emelia Loron, MD as Consulting Physician (General Surgery) Malachy Mood, MD as Consulting Physician (Oncology)   CHIEF COMPLAINT: Follow up GIST and h/o breast cancer   Oncology History Overview Note  Cancer Staging GIST (gastrointestinal stromal tumor) of small bowel, malignant [facility]) Staging form: Gastrointestinal Stromal Tumor - Small Intestinal, Esophageal, Colorectal, Mesenteric, and Peritoneal GIST, AJCC 8th Edition - Pathologic stage from 04/03/2021: Stage IIIB (pT4, pN0, cM0, Mitotic Rate: High) - Signed by Malachy Mood, MD on 04/23/2021  Malignant neoplasm of upper-outer quadrant of left breast in female, estrogen receptor positive (HCC) Staging form: Breast, AJCC 8th Edition - Clinical stage from 03/20/2018: Stage IA (cT1c, cN0, cM0, G2, ER+, PR+, HER2-) - Signed by Loa Socks, NP on 05/27/2018 - Pathologic stage from 05/07/2018: Stage IA (pT1c, pN0, cM0, G1, ER+, PR+, HER2-, Oncotype DX score: 11) - Signed by Loa Socks, NP on 05/27/2018    Malignant neoplasm of upper-outer quadrant of left breast in female, estrogen receptor positive (HCC)  03/20/2018 Initial Diagnosis   Screening detected left breast spiculated mass by ultrasound measured 1.2 cm.  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   This variant was reclassified from a VUS to pathogenic on June 20, 2021.  The Common Hereditary Cancer Panel offered by [facility] includes sequencing and/or deletion duplication testing of the following 47 genes: APC, ATM, AXIN2, BARD1, BMPR1A, BRCA1, BRCA2, BRIP1, CDH1, CDKN2A (p14ARF), CDKN2A (p16INK4a), CKD4, CHEK2, CTNNA1, DICER1, EPCAM (Deletion/duplication testing only), GREM1 (promoter region deletion/duplication testing only), KIT, MEN1, MLH1, MSH2, MSH3, MSH6, MUTYH, NBN, NF1, NHTL1, PALB2, PDGFRA, PMS2, POLD1, POLE, PTEN, RAD50, RAD51C, RAD51D, SDHB, SDHC, SDHD, SMAD4, SMARCA4. STK11, TP53, TSC1, TSC2, and VHL.  The following genes were evaluated for sequence changes only: SDHA and HOXB13 c.251G>A variant only.  Results: No pathogenic variants identified.  A Variant of uncertain significance in the gene NF1 was detected c.2325G>A (Silent). The date of this test report is 04/03/2018.    05/07/2018 Surgery   Left lumpectomy: IDC grade 1, 1.2 cm, intermediate grade DCIS, margins negative,PASH, 0/7 sentinel lymph nodes negative, ER 95%, PR 95%, Ki-67 1%, HER-2 negative ratio 1.47, T1CN0 stage Ia   05/07/2018 Cancer Staging   Staging form: Breast, AJCC 8th Edition - Pathologic stage from 05/07/2018: Stage IA (pT1c, pN0, cM0, G1, ER+, PR+, HER2-, Oncotype DX score: 11) - Signed by Loa Socks, NP on 05/27/2018   05/25/2018 Oncotype testing   Oncotype DX score 11: 3% risk of distant recurrence of 9 years, low risk   06/24/2018 - 08/03/2018 Radiation Therapy   Adjuvant radiation therapy   09/2018 -  Anti-estrogen oral therapy   Tamoxifen daily   GIST (gastrointestinal stromal tumor) of small bowel, malignant (HCC)  04/03/2021 Cancer Staging   Staging form: Gastrointestinal Stromal Tumor - Small Intestinal, Esophageal, Colorectal, Mesenteric, and Peritoneal GIST, AJCC 8th Edition - Pathologic  stage from 04/03/2021: Stage IIIB (pT4, pN0, cM0, Mitotic Rate: High) - Signed by Malachy Mood, MD on  04/23/2021 Stage prefix: Initial diagnosis Histologic grade (G): High grade Histologic grading system: 2 grade system Residual tumor (R): R0 - None   04/23/2021 Initial Diagnosis   GIST (gastrointestinal stromal tumor) of small bowel, malignant (HCC)   05/15/2021 Imaging   CT Chest w/o contrast  IMPRESSION: 1. 6 mm nodular opacity in the posterior right lower lobe with a similar size focus of architectural distortion/scarring in this region on the remote study from 17 years ago, suggesting that this is simply scar although the finding is more confluent on today's study. While almost assuredly benign, consider follow-up CT chest in 6 months to ensure stability. 2. No evidence for metastatic disease in the chest.   08/21/2021 Imaging   EXAM: CT ABDOMEN AND PELVIS WITH CONTRAST  IMPRESSION: Interval surgical resection of large left abdominal cystic mass since prior exam. No evidence of tumor recurrence or metastatic disease within the abdomen or pelvis.   8 mm low-attenuation lesion in pancreatic head, not definitely seen on prior exams. Consider further characterization with MRI, or continued surveillance on follow-up CT.   Small posterior uterine fibroid.   08/23/2021 Imaging   EXAM: MRI ABDOMEN WITHOUT AND WITH CONTRAST  IMPRESSION: 1. No evidence of pancreatic mass or suspicious correlate for the CT abnormality, which may have been artifactual or possibly related to heterogeneous fatty replacement. 2. No acute process or evidence of metastatic disease in the abdomen. 3.  Aortic Atherosclerosis (ICD10-I70.0).      CURRENT THERAPY:  -Imatinib, 400 mg daily, starting 05/07/21 -tamoxifen, starting 08/2018  INTERVAL HISTORY Ms. Bordenave returns for follow up as scheduled, last seen by Dr. Mosetta Putt 11/15/22. She had been doing well. She had surveillance CT AP 6/18 which showed a new enhancing 8.1 x 5.9 cm adnexal mass closely abutting the sigmoid colon. A follow up MRI again showed the  8.2 cm right adnexal mass which was contiguous with the right ovary and lateral wall of the uterus. There was also a small amount of pelvic ascites with peritoneal enhancement, raising concern for peritoneal carcinomatosis. CEA is normal, CA 125 elevated to 62.9 which is similar to 2 years ago. She has seen Dr. Donell Beers and Dr. Pricilla Holm who are planning a joint surgery to resect what is likely recurrent GIST, as well as BSO and possible total hyst.  Today, she feels nervous but doing OK. Nausea and pain are managed with zofran and ibuprofen, respectively. Not taking tramadol much. Still working. Taking Gleevec but stopped tamoxifen last week after talking with Dr. Mosetta Putt.    ROS  All other systems reviewed and negative  Past Medical History:  Diagnosis Date   Allergy    Anemia    Anxiety    Breast cancer (HCC) 05/07/2018   left invasive ductal    Cancer (HCC)    Chest pain    Depression    Family history of breast cancer    Fibromyalgia    neurofibromatosis   H/O: depression    Hx of migraines    Mitral regurgitation    mild per echo EF 55-60%   Neurofibromatosis (HCC)    Palpitations    Personal history of radiation therapy 08/10/2018   left breast 06/24/2018-08/10/2018  Dr Antony Blackbird   Tachycardia    Hx. of   Tachycardia    Wears contact lenses      Past Surgical History:  Procedure Laterality Date  BOWEL RESECTION N/A 04/03/2021   Procedure: SMALL BOWEL RESECTION;  Surgeon: [name], MD;  Location: [facility] OR;  Service: General;  Laterality: N/A;   BREAST BIOPSY     BREAST LUMPECTOMY Left 05/07/2018   Invasive ductal    BREAST LUMPECTOMY WITH RADIOACTIVE SEED AND SENTINEL LYMPH NODE BIOPSY Left 05/07/2018   Procedure: BREAST LUMPECTOMY WITH RADIOACTIVE SEED AND SENTINEL LYMPH NODE BIOPSY;  Surgeon: Emelia Loron, MD;  Location: [facility];  Service: General;  Laterality: Left;   CESAREAN SECTION     CHOLECYSTECTOMY N/A 01/04/2015   Procedure: LAPAROSCOPIC  CHOLECYSTECTOMY WITH INTRAOPERATIVE CHOLANGIOGRAM;  Surgeon: Harriette Bouillon, MD;  Location: [facility];  Service: General;  Laterality: N/A;   COLONOSCOPY     ESOPHAGOGASTRODUODENOSCOPY     LAPAROTOMY N/A 04/03/2021   Procedure: EXPLORATORY LAPAROTOMY WITH RESECTION OF ABDOMINAL MASS;  Surgeon: [name], MD;  Location: [facility];  Service: General;  Laterality: N/A;   MASTOPEXY Bilateral 09/05/2020   Procedure: BILATERAL BREAST MASTOPEXY;  Surgeon: Glenna Fellows, MD;  Location: [facility];  Service: Plastics;  Laterality: Bilateral;   OMENTECTOMY N/A 04/03/2021   Procedure: OMENTECTOMY;  Surgeon: [name], MD;  Location: [facility];  Service: General;  Laterality: N/A;   WISDOM TOOTH EXTRACTION       Outpatient Encounter Medications as of 03/26/2023  Medication Sig   ALPRAZolam (XANAX) 0.5 MG tablet Take 1/2 to 1 tablet by mouth 2 times a day as needed   bisacodyl 5 MG EC tablet Take 4 tablets (20 mg total) by mouth once daily as needed for Constipation for up to 1 dose   CALCIUM PO Take 2 tablets by mouth daily at 6 (six) AM. GUMMY   cetirizine (ZYRTEC) 10 MG tablet Take 10 mg by mouth daily.   imatinib (GLEEVEC) 400 MG tablet Take 1 tablet (400 mg total) by mouth daily. Take with meals and large glass of water.Caution:Chemotherapy.   metoprolol succinate (TOPROL-XL) 50 MG 24 hr tablet Take 1 tablet (50 mg total) by mouth daily.   metroNIDAZOLE (FLAGYL) 500 MG tablet Take 2 tablets by mouth 3 times daily for 3 doses. Take according to your procedure colon prep instructions   Multiple Vitamin (MULTIVITAMIN) tablet Take 1 tablet by mouth daily.   neomycin (MYCIFRADIN) 500 MG tablet Take 2 tablets by mouth 3 times daily for 3 doses. Take according to your procedure colon prep instructions   ondansetron (ZOFRAN) 8 MG tablet Take 1 tablet (8 mg) by mouth every 8 hours as needed for nausea or vomiting.   oxyCODONE (OXY IR/ROXICODONE) 5 MG immediate release tablet  Take 1 tablet (5 mg) by mouth every 4 hours as needed for severe pain. For AFTER surgery, do not take and drive   polyethylene glycol powder (GLYCOLAX/MIRALAX) 17 GM/SCOOP powder Take 238 g by mouth once for 1 dose. Take according to your procedure prep instructions.   tamoxifen (NOLVADEX) 20 MG tablet TAKE 1 TABLET BY MOUTH ONCE A DAY   traMADol (ULTRAM) 50 MG tablet Take 1 tablet (50 mg total) by mouth every 6 (six) hours as needed.   propranolol (INDERAL) 10 MG tablet TAKE 1 TABLET BY MOUTH FOUR TIMES DAILY AS NEEDED FOR PALPITATIONS   No facility-administered encounter medications on file as of 03/26/2023.     Today's Vitals   03/26/23 1053 03/26/23 1054  BP: (!) 120/59   Pulse: 86   Resp: 17   Temp: 98.8 F (37.1 C)   TempSrc: Oral  SpO2: 100%   Weight: 134 lb 12.8 oz (61.1 kg)   PainSc:  3    Body mass index is 24.66 kg/m.   PHYSICAL EXAM GENERAL:alert, no distress and comfortable SKIN: no rash  EYES: sclera clear NECK: without mass LYMPH:  no palpable cervical or supraclavicular lymphadenopathy  LUNGS: clear with normal breathing effort HEART: regular rate & rhythm, no lower extremity edema ABDOMEN: abdomen soft, non-tender and normal bowel sounds.  No palpable mass NEURO: alert & oriented x 3 with fluent speech, no focal motor/sensory deficits Breast exam: Deferred   CBC    Component Value Date/Time   WBC 7.7 03/06/2023 1124   WBC 8.8 04/08/2021 0110   RBC 3.78 (L) 03/06/2023 1124   HGB 12.2 03/06/2023 1124   HGB 13.7 08/08/2017 1522   HCT 36.3 03/06/2023 1124   HCT 40.6 08/08/2017 1522   PLT 390 03/06/2023 1124   PLT 443 (H) 08/08/2017 1522   MCV 96.0 03/06/2023 1124   MCV 84.9 08/08/2017 1522   MCH 32.3 03/06/2023 1124   MCHC 33.6 03/06/2023 1124   RDW 12.6 03/06/2023 1124   RDW 13.3 08/08/2017 1522   LYMPHSABS 1.9 03/06/2023 1124   LYMPHSABS 2.0 08/08/2017 1522   MONOABS 0.9 03/06/2023 1124   MONOABS 1.0 (H) 08/08/2017 1522   EOSABS 0.2 03/06/2023  1124   EOSABS 0.2 08/08/2017 1522   BASOSABS 0.1 03/06/2023 1124   BASOSABS 0.1 08/08/2017 1522     CMP     Component Value Date/Time   NA 141 03/06/2023 1124   K 3.6 03/06/2023 1124   CL 108 03/06/2023 1124   CO2 26 03/06/2023 1124   GLUCOSE 97 03/06/2023 1124   BUN 12 03/06/2023 1124   CREATININE 0.70 03/06/2023 1124   CALCIUM 8.7 (L) 03/06/2023 1124   PROT 6.3 (L) 03/06/2023 1124   ALBUMIN 3.8 03/06/2023 1124   AST 20 03/06/2023 1124   ALT 20 03/06/2023 1124   ALKPHOS 41 03/06/2023 1124   BILITOT 0.5 03/06/2023 1124   GFRNONAA >60 03/06/2023 1124   GFRAA >60 08/03/2018 0801     ASSESSMENT & PLAN:Candise M Sicard is a 47 y.o. female with    GIST (gastrointestinal stromal tumor) of small bowel, malignant; Multifocal GIST of small bowel, pT4N0Mo, stage IIIB, high grade, mitotic rate 9/HPF -Diagnosed in 02/2021, s/p resection on 02/26/21 by Dr. Donell Beers showed multiple tumor in small bowel, with three small lesions which were unable to be resected. Path confirmed GIST, high grade, margins and lymph nodes were negative. -she began imatinib 400 mg daily on 05/07/21. She is tolerating well without issue. Plan for 3 years -Surveillance CT showed a new enhancing 8.1 x 5.9 cm adnexal mass; a follow up MRI showed the 8.2 cm right adnexal mass which was contiguous with the right ovary and lateral wall of the uterus and small amount of pelvic ascites with peritoneal enhancement, raising concern for peritoneal carcinomatosis.  -CEA is normal, CA 125 elevated to 62.9 which is similar to 2 years ago.  -Seen Dr. Donell Beers and Dr. Pricilla Holm who are planning a joint surgery to resect what is likely recurrent GIST, as well as BSO and possible total hyst. -Ms. Russ appears stable, pain and nausea are well managed with supportive care at home. She can continue gleevec through surgery, given that we don't know for sure this is recurrent GIST, and she has stopped tamoxifen.  -Dr. Pricilla Holm has ordered chest  xray for chronic cough, will do in next few days -Labs  reviewed. Surgery on 7/24 -Has strong family support -F/up in 4 weeks, to review final path and discuss the plan   Malignant neoplasm of upper-outer quadrant of left breast in female, estrogen receptor positive; ER+/PR+/HER2-, stage I (pT1cN0) -s/p lumpectomy in 04/2018 and adjuvant radiation. Oncotype DX score 11, adjuvant chemo not indicated -She is on adjuvant tamoxifen, tolerating well. Plan for 10 years  -b/l mammo and [place] in 04/2023 as scheduled.  -We discussed after BSO, she will be a candidate for AI. Will discuss after surgery   Neurofibromatosis -genetic testing 05/15/21 confirmed pathogenic variant in NF1. -There is also an increased chance for certain cancers including a type of eye cancer (optic nerve glioma), cancer of the tissues that cover the nerves (malignant peripheral nerve sheath tumors or MPNST), brain tumors, breast cancer, digestive tract tumors (gastrointestinal stromal tumors or GIST) and adrenal gland tumors (pheochromocytoma or [facility]).     PLAN: -Recent imaging and labs reviewed -Surgery 7/24 with Dr. Pricilla Holm and Dr. Donell Beers -Can continue Gleevec, hold Tamoxifen -F/up in 4 weeks to review surgical path and discuss plan    All questions were answered. The patient knows to call the clinic with any problems, questions or concerns. No barriers to learning were detected. I spent 20 minutes counseling the patient face to face. The total time spent in the appointment was 30 minutes and more than 50% was on counseling, review of test results, and coordination of care.   Santiago Glad, NP-C 03/26/2023

## 2023-03-28 NOTE — Patient Instructions (Addendum)
SURGICAL WAITING ROOM VISITATION  Patients having surgery or a procedure may have no more than 2 support people in the waiting area - these visitors may rotate.    Children under the age of 75 must have an adult with them who is not the patient.  Due to an increase in RSV and influenza rates and associated hospitalizations, children ages 44 and under may not visit patients in [facility] hospitals.  If the patient needs to stay at the hospital during part of their recovery, the visitor guidelines for inpatient rooms apply. Pre-op nurse will coordinate an appropriate time for 1 support person to accompany patient in pre-op.  This support person may not rotate.    Please refer to the [facility] website for the visitor guidelines for Inpatients (after your surgery is over and you are in a regular room).       Your procedure is scheduled on:  04/09/23    Report to [facility] Main Entrance    Report to admitting at 0515 AM   Call this number if you have problems the morning of surgery [phone]   Do not eat food :After Midnight.   After Midnight you may have the following liquids until __ 0430____ AM DAY OF SURGERY  Water Non-Citrus Juices (without pulp, NO RED-Apple, White grape, White cranberry) Black Coffee (NO MILK/CREAM OR CREAMERS, sugar ok)  Clear Tea (NO MILK/CREAM OR CREAMERS, sugar ok) regular and decaf                             Plain Jell-O (NO RED)                                           Fruit ices (not with fruit pulp, NO RED)                                     Popsicles (NO RED)                                                               Sports drinks like Gatorade (NO RED)                              If you have questions, please contact your surgeon's office.       Oral Hygiene is also important to reduce your risk of infection.                                    Remember - BRUSH YOUR TEETH THE MORNING OF SURGERY WITH YOUR REGULAR  TOOTHPASTE  DENTURES WILL BE REMOVED PRIOR TO SURGERY PLEASE DO NOT APPLY "Poly grip" OR ADHESIVES!!!   Do NOT smoke after Midnight   Take these medicines the morning of surgery with A SIP OF WATER:  zyrtrec, toprol,   DO NOT TAKE ANY ORAL DIABETIC MEDICATIONS DAY OF YOUR SURGERY  Bring CPAP mask and  tubing day of surgery.                              You may not have any metal on your body including hair pins, jewelry, and body piercing             Do not wear make-up, lotions, powders, perfumes/cologne, or deodorant  Do not wear nail polish including gel and S&S, artificial/acrylic nails, or any other type of covering on natural nails including finger and toenails. If you have artificial nails, gel coating, etc. that needs to be removed by a nail salon please have this removed prior to surgery or surgery may need to be canceled/ delayed if the surgeon/ anesthesia feels like they are unable to be safely monitored.   Do not shave  48 hours prior to surgery.               Men may shave face and neck.   Do not bring valuables to the hospital. [facility] IS NOT             RESPONSIBLE   FOR VALUABLES.   Contacts, glasses, dentures or bridgework may not be worn into surgery.   Bring small overnight bag day of surgery.   DO NOT BRING YOUR HOME MEDICATIONS TO THE HOSPITAL. PHARMACY WILL DISPENSE MEDICATIONS LISTED ON YOUR MEDICATION LIST TO YOU DURING YOUR ADMISSION IN THE HOSPITAL!    Patients discharged on the day of surgery will not be allowed to drive home.  Someone NEEDS to stay with you for the first 24 hours after anesthesia.   Special Instructions: Bring a copy of your healthcare power of attorney and living will documents the day of surgery if you haven't scanned them before.              Please read over the following fact sheets you were given: IF YOU HAVE QUESTIONS ABOUT YOUR PRE-OP INSTRUCTIONS PLEASE CALL [phone]   If you received a COVID test during your pre-op  visit  it is requested that you wear a mask when out in public, stay away from anyone that may not be feeling well and notify your surgeon if you develop symptoms. If you test positive for Covid or have been in contact with anyone that has tested positive in the last 10 days please notify you surgeon.    [facility] - Preparing for Surgery Before surgery, you can play an important role.  Because skin is not sterile, your skin needs to be as free of germs as possible.  You can reduce the number of germs on your skin by washing with CHG (chlorahexidine gluconate) soap before surgery.  CHG is an antiseptic cleaner which kills germs and bonds with the skin to continue killing germs even after washing. Please DO NOT use if you have an allergy to CHG or antibacterial soaps.  If your skin becomes reddened/irritated stop using the CHG and inform your nurse when you arrive at Short Stay. Do not shave (including legs and underarms) for at least 48 hours prior to the first CHG shower.  You may shave your face/neck. Please follow these instructions carefully:  1.  Shower with CHG Soap the night before surgery and the  morning of Surgery.  2.  If you choose to wash your hair, wash your hair first as usual with your  normal  shampoo.  3.  After you shampoo, rinse your hair and  body thoroughly to remove the  shampoo.                           4.  Use CHG as you would any other liquid soap.  You can apply chg directly  to the skin and wash                       Gently with a scrungie or clean washcloth.  5.  Apply the CHG Soap to your body ONLY FROM THE NECK DOWN.   Do not use on face/ open                           Wound or open sores. Avoid contact with eyes, ears mouth and genitals (private parts).                       Wash face,  Genitals (private parts) with your normal soap.             6.  Wash thoroughly, paying special attention to the area where your surgery  will be performed.  7.  Thoroughly rinse your  body with warm water from the neck down.  8.  DO NOT shower/wash with your normal soap after using and rinsing off  the CHG Soap.                9.  Pat yourself dry with a clean towel.            10.  Wear clean pajamas.            11.  Place clean sheets on your bed the night of your first shower and do not  sleep with pets. Day of Surgery : Do not apply any lotions/deodorants the morning of surgery.  Please wear clean clothes to the hospital/surgery center.  FAILURE TO FOLLOW THESE INSTRUCTIONS MAY RESULT IN THE CANCELLATION OF YOUR SURGERY PATIENT SIGNATURE_________________________________  NURSE SIGNATURE__________________________________  ________________________________________________________________________

## 2023-03-28 NOTE — Progress Notes (Addendum)
Anesthesia Review:  PCP: Delford Field  Cardiologist :  DR  [name]- LOV 10/08/22 Chest x-ray : CT chest- 09/05/22  EKG : 10/08/22  Echo : Stress test: Cardiac Cath :  Activity level: can do a flgiht of stairs without difficulty  Sleep Study/ CPAP : none  Fasting Blood Sugar :      / Checks Blood Sugar -- times a day:   Blood Thinner/ Instructions /Last Dose: ASA / Instructions/ Last Dose    Works at [employer] and spoke with Triage at [facility] and asked for pt since last surgery she had 3 drinks.  DR Donell Beers has ordered for one ERAS drink am of surgery.  Took to pt the ERAS drink in her dept and made her aware to have completed by 0430am morning of surgeyr.  PT voiced understanding and informed her that I called [facility] and was informed by Triage that only one drink was ordered.

## 2023-03-31 ENCOUNTER — Telehealth: Payer: Self-pay | Admitting: Hematology

## 2023-03-31 NOTE — Telephone Encounter (Signed)
Scheduled per 07/10 los, patient has been called and notified. 

## 2023-04-01 ENCOUNTER — Ambulatory Visit (HOSPITAL_COMMUNITY)
Admission: RE | Admit: 2023-04-01 | Discharge: 2023-04-01 | Disposition: A | Discharge status: Home / Self Care | Payer: Commercial Managed Care - PPO | Source: Ambulatory Visit | Attending: Gynecologic Oncology | Admitting: Gynecologic Oncology

## 2023-04-01 DIAGNOSIS — Q8500 Neurofibromatosis, unspecified: Secondary | ICD-10-CM | POA: Diagnosis not present

## 2023-04-01 DIAGNOSIS — J40 Bronchitis, not specified as acute or chronic: Secondary | ICD-10-CM | POA: Diagnosis not present

## 2023-04-01 DIAGNOSIS — R059 Cough, unspecified: Secondary | ICD-10-CM | POA: Diagnosis not present

## 2023-04-01 DIAGNOSIS — R052 Subacute cough: Secondary | ICD-10-CM | POA: Diagnosis not present

## 2023-04-02 ENCOUNTER — Encounter (HOSPITAL_COMMUNITY): Payer: Self-pay

## 2023-04-02 ENCOUNTER — Encounter (HOSPITAL_COMMUNITY)
Admission: RE | Admit: 2023-04-02 | Discharge: 2023-04-02 | Disposition: A | Discharge status: Home / Self Care | Payer: Commercial Managed Care - PPO | Source: Ambulatory Visit | Attending: Gynecologic Oncology | Admitting: Gynecologic Oncology

## 2023-04-02 ENCOUNTER — Other Ambulatory Visit: Payer: Self-pay

## 2023-04-02 DIAGNOSIS — Z9049 Acquired absence of other specified parts of digestive tract: Secondary | ICD-10-CM | POA: Diagnosis not present

## 2023-04-02 DIAGNOSIS — Z853 Personal history of malignant neoplasm of breast: Secondary | ICD-10-CM | POA: Insufficient documentation

## 2023-04-02 DIAGNOSIS — F419 Anxiety disorder, unspecified: Secondary | ICD-10-CM | POA: Insufficient documentation

## 2023-04-02 DIAGNOSIS — M797 Fibromyalgia: Secondary | ICD-10-CM | POA: Diagnosis not present

## 2023-04-02 DIAGNOSIS — I340 Nonrheumatic mitral (valve) insufficiency: Secondary | ICD-10-CM | POA: Diagnosis not present

## 2023-04-02 DIAGNOSIS — D259 Leiomyoma of uterus, unspecified: Secondary | ICD-10-CM | POA: Insufficient documentation

## 2023-04-02 DIAGNOSIS — Z01818 Encounter for other preprocedural examination: Secondary | ICD-10-CM | POA: Insufficient documentation

## 2023-04-02 DIAGNOSIS — F32A Depression, unspecified: Secondary | ICD-10-CM | POA: Insufficient documentation

## 2023-04-02 DIAGNOSIS — N9489 Other specified conditions associated with female genital organs and menstrual cycle: Secondary | ICD-10-CM

## 2023-04-02 DIAGNOSIS — R1900 Intra-abdominal and pelvic swelling, mass and lump, unspecified site: Secondary | ICD-10-CM | POA: Diagnosis not present

## 2023-04-02 HISTORY — DX: Angina pectoris, unspecified: I20.9

## 2023-04-02 LAB — COMPREHENSIVE METABOLIC PANEL WITH GFR
ALT: 17 U/L (ref 0–44)
AST: 20 U/L (ref 15–41)
Albumin: 3.8 g/dL (ref 3.5–5.0)
Alkaline Phosphatase: 38 U/L (ref 38–126)
Anion gap: 7 (ref 5–15)
BUN: 10 mg/dL (ref 6–20)
CO2: 21 mmol/L — ABNORMAL LOW (ref 22–32)
Calcium: 8.4 mg/dL — ABNORMAL LOW (ref 8.9–10.3)
Chloride: 111 mmol/L (ref 98–111)
Creatinine, Ser: 0.74 mg/dL (ref 0.44–1.00)
GFR, Estimated: >60 mL/min
Glucose, Bld: 94 mg/dL (ref 70–99)
Potassium: 3.8 mmol/L (ref 3.5–5.1)
Sodium: 139 mmol/L (ref 135–145)
Total Bilirubin: 0.5 mg/dL (ref 0.3–1.2)
Total Protein: 6.7 g/dL (ref 6.5–8.1)

## 2023-04-02 LAB — CBC
HCT: 38.2 % (ref 36.0–46.0)
Hemoglobin: 12.5 g/dL (ref 12.0–15.0)
MCH: 30.9 pg (ref 26.0–34.0)
MCHC: 32.7 g/dL (ref 30.0–36.0)
MCV: 94.6 fL (ref 80.0–100.0)
Platelets: 388 K/uL (ref 150–400)
RBC: 4.04 MIL/uL (ref 3.87–5.11)
RDW: 12.8 % (ref 11.5–15.5)
WBC: 7.9 K/uL (ref 4.0–10.5)
nRBC: 0 % (ref 0.0–0.2)

## 2023-04-03 ENCOUNTER — Encounter (HOSPITAL_COMMUNITY): Payer: Self-pay

## 2023-04-03 NOTE — Anesthesia Preprocedure Evaluation (Signed)
Anesthesia Evaluation    Airway        Dental   Pulmonary           Cardiovascular      Neuro/Psych    GI/Hepatic   Endo/Other    Renal/GU      Musculoskeletal   Abdominal   Peds  Hematology   Anesthesia Other Findings   Reproductive/Obstetrics                              Anesthesia Physical Anesthesia Plan  ASA:   Anesthesia Plan:    Post-op Pain Management:    Induction:   PONV Risk Score and Plan:   Airway Management Planned:   Additional Equipment:   Intra-op Plan:   Post-operative Plan:   Informed Consent:   Plan Discussed with:   Anesthesia Plan Comments: (See PAT note from 7/17 by Sherlie Ban PA-C )         Anesthesia Quick Evaluation

## 2023-04-03 NOTE — Progress Notes (Signed)
Case: [id number] Date/Time: 04/09/23 0715   Procedures:      XI ROBOT ASSISTED DIAGNOSTIC LAPAROSCOPY, ROBOTIC VERSES OPEN MASS EXCISION, POSSIBLE TOTAL ROBOTIC HYSTERECTOMY WITH BILATERAL SALPINGO OOPHORECTOMY, POSSIBLE STAGING     XI ROBOTIC ASSISTED REMOVAL OF PELVIC MASS     XI ROBOTIC ASSISTED; POSSIBLE OPEN BOWEL RESECTION   Anesthesia type: General   Pre-op diagnosis: ADNEXAL MASS   Location: [facility] ROOM 05 / [facility]   Surgeons: Carver Fila, MD; [name], MD       DISCUSSION: Anne Horton is a 47 yo female who presents to PAT prior to surgery above. PMH significant for hx of breast cancer s/p left lumpectomy (2019), bilateral mastopexy (2021), and radiation, GIST of small bowel s/p ex-lap, SBR, and omentectomy (2022), palpitations/tachycardia, trace MR/TR, neurofibromatosis, fibromyalgia.   Patient underwent surveillance imaging due to GIST on 03/04/23 which showed a new right adnexal mass measuring 8x5cm. Now scheduled for surgery above.   Patient follows with Cardiology for hx of palpitations and tachycardia. Last saw Dr. Elease Hashimoto on 10/08/22. BP and tachycardia noted to be well controlled on current meds. No hx of cardiac disease. Has mild valvular abnormalities on echo from 2011 (trace MR/TR). Vitals normal at PAT visit.   VS: BP (!) 102/53   Pulse 73   Temp 37.2 C (Oral)   Resp 16   Ht 5\' 2"  (1.575 m)   Wt 61.1 kg   SpO2 100%   BMI 24.64 kg/m   PROVIDERS: PCP: Isabella Bowens, PA-C Cardiology: Kristeen Miss, MD Oncology: Malachy Mood, MD  LABS: Labs reviewed: Acceptable for surgery. (all labs ordered are listed, but only abnormal results are displayed)  Labs Reviewed  COMPREHENSIVE METABOLIC PANEL - Abnormal; Notable for the following components:      Result Value   CO2 21 (*)    Calcium 8.4 (*)    All other components within normal limits  CBC  TYPE AND SCREEN     IMAGES:  MRI Pelvis 03/11/23:  IMPRESSION: 8.2 cm right adnexal mass, which is  contiguous with the right ovary and lateral wall of the uterus, and new since CT on 09/04/2022. This is highly suspicious for malignancy, with differential diagnosis including metastatic disease, malignant peripheral nerve sheath tumor, leiomyosarcoma, and less likely ovarian carcinoma.   Small amount of pelvic ascites with peritoneal enhancement, raising suspicion for peritoneal carcinomatosis.   2 cm posterior uterine fibroid.   EKG: n/a   CV:  Echo 04/30/2010;  Normal LV function Trace mitral regurgitation Trace tricuspid regurgitation Stress echo. Patient walked for 6:50 of an advanced Bruce protocol GXT. Stopped due to generalized fatigue and denied any chest pain. No ST/T abnormalities. Resting echo imagies: Normal LV function. Stress echo imagies. Normal increase in LV contractility and thickening in all areas of the LV. Normal stress echo.  Past Medical History:  Diagnosis Date   Allergy    Anemia    Anginal pain (HCC)    Anxiety    Breast cancer (HCC) 05/07/2018   left invasive ductal    Cancer (HCC)    Chest pain    Depression    Family history of breast cancer    Fibromyalgia    neurofibromatosis   H/O: depression    Hx of migraines    Mitral regurgitation    mild per echo EF 55-60%   Neurofibromatosis (HCC)    Palpitations    Personal history of radiation therapy 08/10/2018   left breast 06/24/2018-08/10/2018  Dr Antony Blackbird  Tachycardia    Hx. of   Wears contact lenses     Past Surgical History:  Procedure Laterality Date   BOWEL RESECTION N/A 04/03/2021   Procedure: SMALL BOWEL RESECTION;  Surgeon: [name], MD;  Location: [facility];  Service: General;  Laterality: N/A;   BREAST BIOPSY     BREAST LUMPECTOMY Left 05/07/2018   Invasive ductal    BREAST LUMPECTOMY WITH RADIOACTIVE SEED AND SENTINEL LYMPH NODE BIOPSY Left 05/07/2018   Procedure: BREAST LUMPECTOMY WITH RADIOACTIVE SEED AND SENTINEL LYMPH NODE BIOPSY;  Surgeon: Emelia Loron, MD;   Location: [facility];  Service: General;  Laterality: Left;   CESAREAN SECTION     CHOLECYSTECTOMY N/A 01/04/2015   Procedure: LAPAROSCOPIC CHOLECYSTECTOMY WITH INTRAOPERATIVE CHOLANGIOGRAM;  Surgeon: Harriette Bouillon, MD;  Location: [facility];  Service: General;  Laterality: N/A;   COLONOSCOPY     ESOPHAGOGASTRODUODENOSCOPY     LAPAROTOMY N/A 04/03/2021   Procedure: EXPLORATORY LAPAROTOMY WITH RESECTION OF ABDOMINAL MASS;  Surgeon: [name], MD;  Location[facility];  Service: General;  Laterality: N/A;   MASTOPEXY Bilateral 09/05/2020   Procedure: BILATERAL BREAST MASTOPEXY;  Surgeon: Glenna Fellows, MD;  Location: [facility];  Service: Plastics;  Laterality: Bilateral;   OMENTECTOMY N/A 04/03/2021   Procedure: OMENTECTOMY;  Surgeon: [name], MD;  Location[facility];  Service: General;  Laterality: N/A;   WISDOM TOOTH EXTRACTION      MEDICATIONS:  acetaminophen (TYLENOL) 500 MG tablet   ALPRAZolam (XANAX) 0.5 MG tablet   bisacodyl 5 MG EC tablet   CALCIUM PO   cetirizine (ZYRTEC) 10 MG tablet   ibuprofen (ADVIL) 200 MG tablet   imatinib (GLEEVEC) 400 MG tablet   metoprolol succinate (TOPROL-XL) 50 MG 24 hr tablet   metroNIDAZOLE (FLAGYL) 500 MG tablet   Multiple Vitamin (MULTIVITAMIN) tablet   Multiple Vitamins-Minerals (HAIR SKIN & NAILS) TABS   neomycin (MYCIFRADIN) 500 MG tablet   ondansetron (ZOFRAN) 8 MG tablet   oxyCODONE (OXY IR/ROXICODONE) 5 MG immediate release tablet   polyethylene glycol powder (GLYCOLAX/MIRALAX) 17 GM/SCOOP powder   propranolol (INDERAL) 10 MG tablet   sennosides-docusate sodium (SENOKOT-S) 8.6-50 MG tablet   tamoxifen (NOLVADEX) 20 MG tablet   traMADol (ULTRAM) 50 MG tablet   venlafaxine XR (EFFEXOR-XR) 75 MG 24 hr capsule   No current facility-administered medications for this encounter.   [name] [facility] Short Stay/Anesthesiology [facility] Phone [phone] 04/03/2023 12:59  PM

## 2023-04-07 ENCOUNTER — Encounter: Payer: Self-pay | Admitting: Gynecologic Oncology

## 2023-04-08 ENCOUNTER — Telehealth: Payer: Self-pay | Admitting: *Deleted

## 2023-04-08 NOTE — Telephone Encounter (Signed)
Telephone call to check on pre-operative status.  Patient compliant with pre-operative instructions.  Reinforced nothing to eat after midnight. Clear liquids until 0615. Patient to arrive at 0515.  No questions or concerns voiced.  Instructed to call for any needs.

## 2023-04-09 ENCOUNTER — Ambulatory Visit (HOSPITAL_COMMUNITY): Payer: Commercial Managed Care - PPO | Admitting: Medical

## 2023-04-09 ENCOUNTER — Encounter (HOSPITAL_COMMUNITY)
Admission: RE | Disposition: A | Discharge status: Home / Self Care | Payer: Self-pay | Source: Home / Self Care | Attending: Gynecologic Oncology

## 2023-04-09 ENCOUNTER — Encounter (HOSPITAL_COMMUNITY): Payer: Self-pay | Admitting: Gynecologic Oncology

## 2023-04-09 ENCOUNTER — Other Ambulatory Visit: Payer: Self-pay

## 2023-04-09 ENCOUNTER — Ambulatory Visit (HOSPITAL_COMMUNITY): Payer: Commercial Managed Care - PPO | Admitting: Anesthesiology

## 2023-04-09 ENCOUNTER — Inpatient Hospital Stay (HOSPITAL_COMMUNITY)
Admission: RE | Admit: 2023-04-09 | Discharge: 2023-04-12 | DRG: 337 | Disposition: A | Discharge status: Home / Self Care | Payer: Commercial Managed Care - PPO | Attending: Obstetrics & Gynecology | Admitting: Obstetrics & Gynecology

## 2023-04-09 DIAGNOSIS — N83202 Unspecified ovarian cyst, left side: Secondary | ICD-10-CM | POA: Diagnosis not present

## 2023-04-09 DIAGNOSIS — Z98891 History of uterine scar from previous surgery: Secondary | ICD-10-CM

## 2023-04-09 DIAGNOSIS — C49A3 Gastrointestinal stromal tumor of small intestine: Principal | ICD-10-CM

## 2023-04-09 DIAGNOSIS — R053 Chronic cough: Secondary | ICD-10-CM | POA: Diagnosis present

## 2023-04-09 DIAGNOSIS — G8918 Other acute postprocedural pain: Secondary | ICD-10-CM | POA: Diagnosis not present

## 2023-04-09 DIAGNOSIS — Z8279 Family history of other congenital malformations, deformations and chromosomal abnormalities: Secondary | ICD-10-CM

## 2023-04-09 DIAGNOSIS — Z888 Allergy status to other drugs, medicaments and biological substances status: Secondary | ICD-10-CM

## 2023-04-09 DIAGNOSIS — N83201 Unspecified ovarian cyst, right side: Secondary | ICD-10-CM | POA: Diagnosis present

## 2023-04-09 DIAGNOSIS — Z5331 Laparoscopic surgical procedure converted to open procedure: Secondary | ICD-10-CM

## 2023-04-09 DIAGNOSIS — G43909 Migraine, unspecified, not intractable, without status migrainosus: Secondary | ICD-10-CM | POA: Diagnosis present

## 2023-04-09 DIAGNOSIS — D3911 Neoplasm of uncertain behavior of right ovary: Secondary | ICD-10-CM | POA: Diagnosis not present

## 2023-04-09 DIAGNOSIS — M797 Fibromyalgia: Secondary | ICD-10-CM | POA: Diagnosis present

## 2023-04-09 DIAGNOSIS — Z803 Family history of malignant neoplasm of breast: Secondary | ICD-10-CM | POA: Diagnosis not present

## 2023-04-09 DIAGNOSIS — Z853 Personal history of malignant neoplasm of breast: Secondary | ICD-10-CM

## 2023-04-09 DIAGNOSIS — C49A9 Gastrointestinal stromal tumor of other sites: Secondary | ICD-10-CM | POA: Diagnosis not present

## 2023-04-09 DIAGNOSIS — Z79818 Long term (current) use of other agents affecting estrogen receptors and estrogen levels: Secondary | ICD-10-CM | POA: Diagnosis not present

## 2023-04-09 DIAGNOSIS — Z8249 Family history of ischemic heart disease and other diseases of the circulatory system: Secondary | ICD-10-CM

## 2023-04-09 DIAGNOSIS — Z91040 Latex allergy status: Secondary | ICD-10-CM | POA: Diagnosis not present

## 2023-04-09 DIAGNOSIS — Z923 Personal history of irradiation: Secondary | ICD-10-CM | POA: Diagnosis not present

## 2023-04-09 DIAGNOSIS — Z833 Family history of diabetes mellitus: Secondary | ICD-10-CM

## 2023-04-09 DIAGNOSIS — R1909 Other intra-abdominal and pelvic swelling, mass and lump: Secondary | ICD-10-CM | POA: Diagnosis not present

## 2023-04-09 DIAGNOSIS — Q8501 Neurofibromatosis, type 1: Secondary | ICD-10-CM

## 2023-04-09 DIAGNOSIS — N9489 Other specified conditions associated with female genital organs and menstrual cycle: Principal | ICD-10-CM

## 2023-04-09 DIAGNOSIS — I340 Nonrheumatic mitral (valve) insufficiency: Secondary | ICD-10-CM | POA: Diagnosis present

## 2023-04-09 DIAGNOSIS — N736 Female pelvic peritoneal adhesions (postinfective): Secondary | ICD-10-CM | POA: Diagnosis present

## 2023-04-09 DIAGNOSIS — Z79899 Other long term (current) drug therapy: Secondary | ICD-10-CM

## 2023-04-09 DIAGNOSIS — R1900 Intra-abdominal and pelvic swelling, mass and lump, unspecified site: Secondary | ICD-10-CM

## 2023-04-09 DIAGNOSIS — D509 Iron deficiency anemia, unspecified: Secondary | ICD-10-CM | POA: Diagnosis present

## 2023-04-09 DIAGNOSIS — Z5982 Transportation insecurity: Secondary | ICD-10-CM | POA: Diagnosis not present

## 2023-04-09 DIAGNOSIS — Z170 Estrogen receptor positive status [ER+]: Secondary | ICD-10-CM

## 2023-04-09 DIAGNOSIS — Z823 Family history of stroke: Secondary | ICD-10-CM | POA: Diagnosis not present

## 2023-04-09 DIAGNOSIS — N8302 Follicular cyst of left ovary: Secondary | ICD-10-CM | POA: Diagnosis not present

## 2023-04-09 DIAGNOSIS — N838 Other noninflammatory disorders of ovary, fallopian tube and broad ligament: Secondary | ICD-10-CM | POA: Diagnosis not present

## 2023-04-09 DIAGNOSIS — R9230 Dense breasts, unspecified: Secondary | ICD-10-CM | POA: Diagnosis present

## 2023-04-09 DIAGNOSIS — Z9049 Acquired absence of other specified parts of digestive tract: Secondary | ICD-10-CM

## 2023-04-09 HISTORY — PX: ROBOTIC ASSISTED LAPAROSCOPIC LYSIS OF ADHESION: SHX6080

## 2023-04-09 HISTORY — PX: LAPAROSCOPY: SHX197

## 2023-04-09 LAB — BASIC METABOLIC PANEL
Anion gap: 9 (ref 5–15)
BUN: 7 mg/dL (ref 6–20)
CO2: 20 mmol/L — ABNORMAL LOW (ref 22–32)
Calcium: 7.3 mg/dL — ABNORMAL LOW (ref 8.9–10.3)
Chloride: 109 mmol/L (ref 98–111)
Creatinine, Ser: 0.68 mg/dL (ref 0.44–1.00)
GFR, Estimated: >60 mL/min (ref >60)
Glucose, Bld: 179 mg/dL — ABNORMAL HIGH (ref 70–99)
Potassium: 4 mmol/L (ref 3.5–5.1)
Sodium: 138 mmol/L (ref 135–145)

## 2023-04-09 LAB — TYPE AND SCREEN
ABO/RH(D): A POS
Antibody Screen: NEGATIVE

## 2023-04-09 LAB — CBC
HCT: 25.6 % — ABNORMAL LOW (ref 36.0–46.0)
Hemoglobin: 8.3 g/dL — ABNORMAL LOW (ref 12.0–15.0)
MCH: 31.4 pg (ref 26.0–34.0)
MCHC: 32.4 g/dL (ref 30.0–36.0)
MCV: 97 fL (ref 80.0–100.0)
Platelets: 249 10*3/uL (ref 150–400)
RBC: 2.64 MIL/uL — ABNORMAL LOW (ref 3.87–5.11)
RDW: 12.5 % (ref 11.5–15.5)
WBC: 14.6 10*3/uL — ABNORMAL HIGH (ref 4.0–10.5)
nRBC: 0 % (ref 0.0–0.2)

## 2023-04-09 LAB — POCT PREGNANCY, URINE: Preg Test, Ur: NEGATIVE

## 2023-04-09 SURGERY — LAPAROSCOPY, DIAGNOSTIC
Anesthesia: General

## 2023-04-09 MED ORDER — METOPROLOL SUCCINATE ER 50 MG PO TB24
50.0000 mg | ORAL_TABLET | Freq: Every day | ORAL | Status: DC
  Administered 2023-04-10 – 2023-04-12 (×3): 50 mg via ORAL
  Filled 2023-04-09 (×3): qty 1

## 2023-04-09 MED ORDER — OXYCODONE HCL 5 MG PO TABS
ORAL_TABLET | ORAL | Status: AC
  Filled 2023-04-09: qty 1

## 2023-04-09 MED ORDER — POVIDONE-IODINE 10 % EX SWAB: 2.0000 | Freq: Once | CUTANEOUS | Status: DC

## 2023-04-09 MED ORDER — OXYCODONE HCL 5 MG/5ML PO SOLN: 5.0000 mg | Freq: Once | ORAL | Status: AC | PRN

## 2023-04-09 MED ORDER — ROCURONIUM BROMIDE 100 MG/10ML IV SOLN
INTRAVENOUS | Status: DC | PRN
  Administered 2023-04-09: 20 mg via INTRAVENOUS
  Administered 2023-04-09: 50 mg via INTRAVENOUS
  Administered 2023-04-09 (×2): 20 mg via INTRAVENOUS
  Administered 2023-04-09: 10 mg via INTRAVENOUS

## 2023-04-09 MED ORDER — PROPOFOL 500 MG/50ML IV EMUL
INTRAVENOUS | Status: AC
  Filled 2023-04-09: qty 50

## 2023-04-09 MED ORDER — KETAMINE HCL 50 MG/5ML IJ SOSY
PREFILLED_SYRINGE | INTRAMUSCULAR | Status: AC
  Filled 2023-04-09: qty 5

## 2023-04-09 MED ORDER — LORATADINE 10 MG PO TABS
10.0000 mg | ORAL_TABLET | Freq: Every day | ORAL | Status: DC
  Administered 2023-04-10 – 2023-04-12 (×3): 10 mg via ORAL
  Filled 2023-04-09 (×3): qty 1

## 2023-04-09 MED ORDER — OXYCODONE HCL 5 MG PO TABS
5.0000 mg | ORAL_TABLET | Freq: Once | ORAL | Status: AC | PRN
  Administered 2023-04-09: 5 mg via ORAL

## 2023-04-09 MED ORDER — ESMOLOL HCL 100 MG/10ML IV SOLN
INTRAVENOUS | Status: DC | PRN
  Administered 2023-04-09: 30 mg via INTRAVENOUS

## 2023-04-09 MED ORDER — SURGIFLO WITH THROMBIN (HEMOSTATIC MATRIX KIT) OPTIME
TOPICAL | Status: DC | PRN
  Administered 2023-04-09: 1 via TOPICAL

## 2023-04-09 MED ORDER — ALBUMIN HUMAN 5 % IV SOLN: INTRAVENOUS | Status: DC | PRN

## 2023-04-09 MED ORDER — FENTANYL CITRATE (PF) 250 MCG/5ML IJ SOLN
INTRAMUSCULAR | Status: AC
  Filled 2023-04-09: qty 5

## 2023-04-09 MED ORDER — BUPIVACAINE HCL 0.25 % IJ SOLN
INTRAMUSCULAR | Status: AC
  Filled 2023-04-09: qty 1

## 2023-04-09 MED ORDER — SENNOSIDES-DOCUSATE SODIUM 8.6-50 MG PO TABS
2.0000 | ORAL_TABLET | Freq: Every day | ORAL | Status: DC
  Administered 2023-04-09 – 2023-04-11 (×3): 2 via ORAL
  Filled 2023-04-09 (×3): qty 2

## 2023-04-09 MED ORDER — ESMOLOL HCL 100 MG/10ML IV SOLN
INTRAVENOUS | Status: AC
  Filled 2023-04-09: qty 10

## 2023-04-09 MED ORDER — FENTANYL CITRATE PF 50 MCG/ML IJ SOSY
PREFILLED_SYRINGE | INTRAMUSCULAR | Status: AC
  Administered 2023-04-09: 50 ug via INTRAVENOUS
  Filled 2023-04-09: qty 2

## 2023-04-09 MED ORDER — METRONIDAZOLE 500 MG/100ML IV SOLN
500.0000 mg | Freq: Once | INTRAVENOUS | Status: AC
  Administered 2023-04-09: 500 mg via INTRAVENOUS
  Filled 2023-04-09: qty 100

## 2023-04-09 MED ORDER — HYDROMORPHONE HCL 1 MG/ML IJ SOLN
INTRAMUSCULAR | Status: AC
  Administered 2023-04-09: 0.5 mg via INTRAVENOUS
  Filled 2023-04-09: qty 2

## 2023-04-09 MED ORDER — HYDROMORPHONE HCL 1 MG/ML IJ SOLN
0.5000 mg | INTRAMUSCULAR | Status: DC | PRN
  Administered 2023-04-09 – 2023-04-10 (×2): 1 mg via INTRAVENOUS
  Filled 2023-04-09 (×2): qty 1

## 2023-04-09 MED ORDER — CEFAZOLIN SODIUM-DEXTROSE 2-4 GM/100ML-% IV SOLN
2.0000 g | INTRAVENOUS | Status: AC
  Administered 2023-04-09 (×2): 2 g via INTRAVENOUS
  Filled 2023-04-09: qty 100

## 2023-04-09 MED ORDER — PHENYLEPHRINE HCL-NACL 20-0.9 MG/250ML-% IV SOLN
INTRAVENOUS | Status: DC | PRN
  Administered 2023-04-09: 25 ug/min via INTRAVENOUS

## 2023-04-09 MED ORDER — ORAL CARE MOUTH RINSE: 15.0000 mL | Freq: Once | OROMUCOSAL | Status: AC

## 2023-04-09 MED ORDER — BUPIVACAINE HCL (PF) 0.25 % IJ SOLN
INTRAMUSCULAR | Status: DC | PRN
  Administered 2023-04-09: 26 mL

## 2023-04-09 MED ORDER — LIDOCAINE HCL (CARDIAC) PF 100 MG/5ML IV SOSY
PREFILLED_SYRINGE | INTRAVENOUS | Status: DC | PRN
  Administered 2023-04-09: 80 mg via INTRAVENOUS

## 2023-04-09 MED ORDER — CHLORHEXIDINE GLUCONATE CLOTH 2 % EX PADS: 6.0000 | MEDICATED_PAD | Freq: Once | CUTANEOUS | Status: DC

## 2023-04-09 MED ORDER — ENOXAPARIN SODIUM 40 MG/0.4ML IJ SOSY
40.0000 mg | PREFILLED_SYRINGE | INTRAMUSCULAR | Status: DC
  Administered 2023-04-10 – 2023-04-12 (×3): 40 mg via SUBCUTANEOUS
  Filled 2023-04-09 (×3): qty 0.4

## 2023-04-09 MED ORDER — LACTATED RINGERS IR SOLN
Status: DC | PRN
  Administered 2023-04-09: 1000 mL

## 2023-04-09 MED ORDER — SUGAMMADEX SODIUM 200 MG/2ML IV SOLN
INTRAVENOUS | Status: DC | PRN
  Administered 2023-04-09: 260 mg via INTRAVENOUS

## 2023-04-09 MED ORDER — VENLAFAXINE HCL ER 37.5 MG PO CP24
75.0000 mg | ORAL_CAPSULE | Freq: Every day | ORAL | Status: DC
  Administered 2023-04-10 – 2023-04-12 (×3): 75 mg via ORAL
  Filled 2023-04-09 (×4): qty 2

## 2023-04-09 MED ORDER — PHENAZOPYRIDINE HCL 100 MG PO TABS: 100.0000 mg | ORAL_TABLET | Freq: Three times a day (TID) | ORAL | Status: DC | PRN

## 2023-04-09 MED ORDER — PHENYLEPHRINE HCL-NACL 20-0.9 MG/250ML-% IV SOLN
INTRAVENOUS | Status: AC
  Filled 2023-04-09: qty 250

## 2023-04-09 MED ORDER — LIDOCAINE HCL URETHRAL/MUCOSAL 2 % EX GEL
1.0000 | Freq: Once | CUTANEOUS | Status: AC
  Administered 2023-04-09: 1 via URETHRAL
  Filled 2023-04-09: qty 5

## 2023-04-09 MED ORDER — ALPRAZOLAM 0.25 MG PO TABS
0.2500 mg | ORAL_TABLET | Freq: Two times a day (BID) | ORAL | Status: DC | PRN
  Administered 2023-04-09: 0.5 mg via ORAL
  Filled 2023-04-09: qty 2

## 2023-04-09 MED ORDER — BUPIVACAINE-EPINEPHRINE (PF) 0.25% -1:200000 IJ SOLN
INTRAMUSCULAR | Status: DC | PRN
  Administered 2023-04-09: 30 mL
  Administered 2023-04-09: 20 mL

## 2023-04-09 MED ORDER — KCL IN DEXTROSE-NACL 20-5-0.45 MEQ/L-%-% IV SOLN
INTRAVENOUS | Status: DC
  Filled 2023-04-09 (×2): qty 1000

## 2023-04-09 MED ORDER — ONDANSETRON HCL 4 MG/2ML IJ SOLN
INTRAMUSCULAR | Status: AC
  Filled 2023-04-09: qty 2

## 2023-04-09 MED ORDER — CHLORHEXIDINE GLUCONATE 0.12 % MT SOLN
15.0000 mL | Freq: Once | OROMUCOSAL | Status: AC
  Administered 2023-04-09: 15 mL via OROMUCOSAL

## 2023-04-09 MED ORDER — ONDANSETRON HCL 4 MG/2ML IJ SOLN
4.0000 mg | Freq: Four times a day (QID) | INTRAMUSCULAR | Status: DC | PRN
  Administered 2023-04-09: 4 mg via INTRAVENOUS
  Filled 2023-04-09: qty 2

## 2023-04-09 MED ORDER — MIDAZOLAM HCL 2 MG/2ML IJ SOLN
INTRAMUSCULAR | Status: AC
  Filled 2023-04-09: qty 2

## 2023-04-09 MED ORDER — BUPIVACAINE LIPOSOME 1.3 % IJ SUSP
INTRAMUSCULAR | Status: DC | PRN
  Administered 2023-04-09: 10 mL

## 2023-04-09 MED ORDER — FENTANYL CITRATE (PF) 100 MCG/2ML IJ SOLN
INTRAMUSCULAR | Status: DC | PRN
  Administered 2023-04-09: 50 ug via INTRAVENOUS
  Administered 2023-04-09: 100 ug via INTRAVENOUS
  Administered 2023-04-09 (×2): 50 ug via INTRAVENOUS

## 2023-04-09 MED ORDER — OXYCODONE HCL 5 MG PO TABS
5.0000 mg | ORAL_TABLET | ORAL | Status: DC | PRN
  Administered 2023-04-09: 5 mg via ORAL
  Administered 2023-04-10 (×2): 10 mg via ORAL
  Administered 2023-04-11: 5 mg via ORAL
  Filled 2023-04-09 (×2): qty 2
  Filled 2023-04-09 (×2): qty 1
  Filled 2023-04-09: qty 2

## 2023-04-09 MED ORDER — MIDAZOLAM HCL 5 MG/5ML IJ SOLN
INTRAMUSCULAR | Status: DC | PRN
  Administered 2023-04-09: 2 mg via INTRAVENOUS

## 2023-04-09 MED ORDER — DEXMEDETOMIDINE HCL IN NACL 80 MCG/20ML IV SOLN
INTRAVENOUS | Status: DC | PRN
  Administered 2023-04-09: 8 ug via INTRAVENOUS

## 2023-04-09 MED ORDER — LACTATED RINGERS IV SOLN: INTRAVENOUS | Status: DC

## 2023-04-09 MED ORDER — HEPARIN SODIUM (PORCINE) 5000 UNIT/ML IJ SOLN
5000.0000 [IU] | INTRAMUSCULAR | Status: AC
  Administered 2023-04-09: 5000 [IU] via SUBCUTANEOUS
  Filled 2023-04-09: qty 1

## 2023-04-09 MED ORDER — ACETAMINOPHEN 500 MG PO TABS
1000.0000 mg | ORAL_TABLET | ORAL | Status: AC
  Administered 2023-04-09: 1000 mg via ORAL
  Filled 2023-04-09: qty 2

## 2023-04-09 MED ORDER — ONDANSETRON HCL 4 MG PO TABS: 4.0000 mg | ORAL_TABLET | Freq: Four times a day (QID) | ORAL | Status: DC | PRN

## 2023-04-09 MED ORDER — LACTATED RINGERS IV SOLN: INTRAVENOUS | Status: DC | PRN

## 2023-04-09 MED ORDER — ONDANSETRON HCL 4 MG/2ML IJ SOLN: 4.0000 mg | Freq: Once | INTRAMUSCULAR | Status: DC | PRN

## 2023-04-09 MED ORDER — TRAMADOL HCL 50 MG PO TABS
50.0000 mg | ORAL_TABLET | Freq: Four times a day (QID) | ORAL | Status: DC | PRN
  Administered 2023-04-10 – 2023-04-12 (×7): 50 mg via ORAL
  Filled 2023-04-09 (×7): qty 1

## 2023-04-09 MED ORDER — 0.9 % SODIUM CHLORIDE (POUR BTL) OPTIME
TOPICAL | Status: DC | PRN
  Administered 2023-04-09: 1000 mL

## 2023-04-09 MED ORDER — ONDANSETRON HCL 4 MG/2ML IJ SOLN
INTRAMUSCULAR | Status: DC | PRN
Start: 2023-04-09 — End: 2023-04-09
  Administered 2023-04-09: 4 mg via INTRAVENOUS

## 2023-04-09 MED ORDER — PROPOFOL 10 MG/ML IV BOLUS
INTRAVENOUS | Status: AC
  Filled 2023-04-09: qty 20

## 2023-04-09 MED ORDER — SODIUM CHLORIDE (PF) 0.9 % IJ SOLN
INTRAMUSCULAR | Status: DC | PRN
  Administered 2023-04-09: 10 mL

## 2023-04-09 MED ORDER — PROPOFOL 10 MG/ML IV BOLUS
INTRAVENOUS | Status: DC | PRN
Start: 2023-04-09 — End: 2023-04-09
  Administered 2023-04-09: 150 mg via INTRAVENOUS

## 2023-04-09 MED ORDER — HYDROMORPHONE HCL 1 MG/ML IJ SOLN
0.2500 mg | INTRAMUSCULAR | Status: DC | PRN
  Administered 2023-04-09 (×3): 0.5 mg via INTRAVENOUS

## 2023-04-09 MED ORDER — CEFAZOLIN SODIUM 1 G IJ SOLR
INTRAMUSCULAR | Status: AC
  Filled 2023-04-09: qty 20

## 2023-04-09 MED ORDER — KETAMINE HCL 10 MG/ML IJ SOLN
INTRAMUSCULAR | Status: DC | PRN
  Administered 2023-04-09 (×2): 20 mg via INTRAVENOUS

## 2023-04-09 MED ORDER — FENTANYL CITRATE PF 50 MCG/ML IJ SOSY
25.0000 ug | PREFILLED_SYRINGE | INTRAMUSCULAR | Status: DC | PRN
  Administered 2023-04-09: 50 ug via INTRAVENOUS

## 2023-04-09 MED ORDER — DEXAMETHASONE SODIUM PHOSPHATE 4 MG/ML IJ SOLN
4.0000 mg | INTRAMUSCULAR | Status: AC
  Administered 2023-04-09: 8 mg via INTRAVENOUS

## 2023-04-09 MED ORDER — STERILE WATER FOR IRRIGATION IR SOLN
Status: DC | PRN
  Administered 2023-04-09: 1000 mL

## 2023-04-09 MED ORDER — DEXAMETHASONE SODIUM PHOSPHATE 10 MG/ML IJ SOLN
INTRAMUSCULAR | Status: AC
  Filled 2023-04-09: qty 1

## 2023-04-09 MED ORDER — EPHEDRINE SULFATE (PRESSORS) 50 MG/ML IJ SOLN
INTRAMUSCULAR | Status: DC | PRN
  Administered 2023-04-09: 20 mg via INTRAVENOUS

## 2023-04-09 MED ORDER — BUPIVACAINE LIPOSOME 1.3 % IJ SUSP
INTRAMUSCULAR | Status: AC
  Filled 2023-04-09: qty 10

## 2023-04-09 MED ORDER — PHENYLEPHRINE HCL (PRESSORS) 10 MG/ML IV SOLN
INTRAVENOUS | Status: DC | PRN
  Administered 2023-04-09 (×2): 80 ug via INTRAVENOUS
  Administered 2023-04-09: 160 ug via INTRAVENOUS
  Administered 2023-04-09 (×3): 80 ug via INTRAVENOUS

## 2023-04-09 SURGICAL SUPPLY — 69 items
ADH SKN CLS APL DERMABOND .7 (GAUZE/BANDAGES/DRESSINGS) ×2
AGENT HMST KT MTR STRL THRMB (HEMOSTASIS) ×1
APL ESCP 34 STRL LF DISP (HEMOSTASIS) ×1
APL PRP STRL LF DISP 70% ISPRP (MISCELLANEOUS) ×1
APL SRG 38 LTWT LNG FL B (MISCELLANEOUS) ×1
APPLICATOR ARISTA FLEXITIP XL (MISCELLANEOUS) IMPLANT
APPLICATOR SURGIFLO ENDO (HEMOSTASIS) IMPLANT
BAG COUNTER SPONGE SURGICOUNT (BAG) IMPLANT
BAG LAPAROSCOPIC 12 15 PORT 16 (BASKET) IMPLANT
BAG RETRIEVAL 12/15 (BASKET) ×1
BAG SPNG CNTER NS LX DISP (BAG)
BLADE EXTENDED COATED 6.5IN (ELECTRODE) IMPLANT
CABLE HIGH FREQUENCY MONO STRZ (ELECTRODE) IMPLANT
CHLORAPREP W/TINT 26 (MISCELLANEOUS) ×1 IMPLANT
CNTNR URN SCR LID CUP LEK RST (MISCELLANEOUS) IMPLANT
CONT SPEC 4OZ STRL OR WHT (MISCELLANEOUS)
COVER SURGICAL LIGHT HANDLE (MISCELLANEOUS) ×1 IMPLANT
COVER TIP SHEARS 8 DVNC (MISCELLANEOUS) IMPLANT
CUTTER FLEX LINEAR 45M (STAPLE) IMPLANT
DERMABOND ADVANCED .7 DNX12 (GAUZE/BANDAGES/DRESSINGS) ×1 IMPLANT
DRAIN CHANNEL 10M FLAT 3/4 FLT (DRAIN) IMPLANT
DRSG OPSITE POSTOP 4X6 (GAUZE/BANDAGES/DRESSINGS) IMPLANT
DRSG TELFA 3X8 NADH STRL (GAUZE/BANDAGES/DRESSINGS) IMPLANT
ELECT REM PT RETURN 15FT ADLT (MISCELLANEOUS) ×1 IMPLANT
EVACUATOR DRAINAGE 10X20 100CC (DRAIN) IMPLANT
EVACUATOR SILICONE 100CC (DRAIN) ×1
GLOVE BIO SURGEON STRL SZ 6 (GLOVE) ×2 IMPLANT
GLOVE BIO SURGEON STRL SZ 6.5 (GLOVE) ×2 IMPLANT
GOWN STRL REUS W/ TWL LRG LVL3 (GOWN DISPOSABLE) ×2 IMPLANT
GOWN STRL REUS W/TWL LRG LVL3 (GOWN DISPOSABLE) ×2
HEMOSTAT ARISTA ABSORB 3G PWDR (HEMOSTASIS) IMPLANT
HOLDER FOLEY CATH W/STRAP (MISCELLANEOUS) IMPLANT
IRRIG SUCT STRYKERFLOW 2 WTIP (MISCELLANEOUS)
IRRIGATION SUCT STRKRFLW 2 WTP (MISCELLANEOUS) IMPLANT
KIT BASIN OR (CUSTOM PROCEDURE TRAY) ×1 IMPLANT
KIT TURNOVER KIT A (KITS) IMPLANT
MANIPULATOR UTERINE 4.5 ZUMI (MISCELLANEOUS) IMPLANT
OBTURATOR OPTICAL STND 8 DVNC (TROCAR) ×1
OBTURATOR OPTICALSTD 8 DVNC (TROCAR) IMPLANT
PAD POSITIONING PINK XL (MISCELLANEOUS) ×1 IMPLANT
RELOAD STAPLE 45 3.5 BLU ETS (ENDOMECHANICALS) IMPLANT
RELOAD STAPLE TA45 3.5 REG BLU (ENDOMECHANICALS) ×1 IMPLANT
SCISSORS LAP 5X35 DISP (ENDOMECHANICALS) IMPLANT
SEAL UNIV 5-12 XI (MISCELLANEOUS) IMPLANT
SEALER TISSUE G2 CVD JAW 45CM (ENDOMECHANICALS) IMPLANT
SEALER VESSEL EXT DVNC XI (MISCELLANEOUS) IMPLANT
SHEET LAVH (DRAPES) ×1 IMPLANT
SLEEVE Z-THREAD 5X100MM (TROCAR) ×1 IMPLANT
SPIKE FLUID TRANSFER (MISCELLANEOUS) IMPLANT
SPONGE DRAIN TRACH 4X4 STRL 2S (GAUZE/BANDAGES/DRESSINGS) IMPLANT
SURGIFLO W/THROMBIN 8M KIT (HEMOSTASIS) IMPLANT
SUT ETHILON 2 0 PS N (SUTURE) IMPLANT
SUT MNCRL AB 4-0 PS2 18 (SUTURE) ×2 IMPLANT
SUT PDS AB 1 TP1 96 (SUTURE) IMPLANT
SUT VIC AB 2-0 CT1 27 (SUTURE) ×1
SUT VIC AB 2-0 CT1 TAPERPNT 27 (SUTURE) IMPLANT
SYS BAG RETRIEVAL 10MM (BASKET) ×1
SYS RETRIEVAL 5MM INZII UNIV (BASKET)
SYSTEM BAG RETRIEVAL 10MM (BASKET) IMPLANT
SYSTEM RETRIEVL 5MM INZII UNIV (BASKET) IMPLANT
TOWEL OR 17X26 10 PK STRL BLUE (TOWEL DISPOSABLE) ×1 IMPLANT
TOWEL OR NON WOVEN STRL DISP B (DISPOSABLE) ×1 IMPLANT
TRAY FOLEY MTR SLVR 16FR STAT (SET/KITS/TRAYS/PACK) ×1 IMPLANT
TRAY LAPAROSCOPIC (CUSTOM PROCEDURE TRAY) ×1 IMPLANT
TROCAR ADV FIXATION 12X100MM (TROCAR) IMPLANT
TROCAR BALLN 12MMX100 BLUNT (TROCAR) IMPLANT
TROCAR PORT AIRSEAL 5X120 (TROCAR) IMPLANT
TROCAR Z-THREAD FIOS 5X100MM (TROCAR) IMPLANT
TROCAR Z-THREAD OPTICAL 5X100M (TROCAR) ×1 IMPLANT

## 2023-04-09 NOTE — Addendum Note (Signed)
Addendum  created 04/09/23 1457 by Deri Fuelling, CRNA   Intraprocedure Meds edited

## 2023-04-09 NOTE — Anesthesia Procedure Notes (Signed)
Anesthesia Regional Block: TAP block   Pre-Anesthetic Checklist: , timeout performed,  Correct Patient, Correct Site, Correct Laterality,  Correct Procedure, Correct Position, site marked,  Risks and benefits discussed,  Surgical consent,  Pre-op evaluation,  At surgeon's request and post-op pain management  Laterality: Left and Right  Prep: chloraprep       Needles:  Injection technique: Single-shot  Needle Type: Echogenic Needle     Needle Length: 9cm      Additional Needles:   Procedures:,,,, ultrasound used (permanent image in chart),,    Narrative:  Start time: 04/09/2023 8:00 AM End time: 04/09/2023 8:10 AM Injection made incrementally with aspirations every 5 mL.  Performed by: Personally  Anesthesiologist: Eilene Ghazi, MD  Additional Notes: Patient tolerated the procedure well without complications

## 2023-04-09 NOTE — H&P (Signed)
PROVIDER: Matthias Hughs, MD Anne Horton Care Team: Lorene Dy, NP as PCP - General Malachy Mood, MD (Hematology and Oncology) Matthias Hughs, MD as Consulting Provider (Surgical Oncology)  MRN: [id number] DOB: Apr 21, 1976 DATE OF ENCOUNTER: 03/18/2023  Chief Complaint: RE-CHECK (Recurrent GIST, new CT)  History of Present Illness: Anne Horton is a 47 y.o. female who is seen today for follow up GIST.  Initial history:   Anne Horton presented with stage I left breast cancer in 2019. She had breast conserving therapy with Dr. Dwain Sarna followed by low Oncotype score, radiation, and tamoxifen therapy which she is taking for a total of 10 years. I met her June 2022. She was seen to have a 16 cm mass in her abdomen that was felt to likely be a GIST. I explored her February 26, 2021 she was found to have the primary large GIST as well as multiple others. This was not surprising given that she has neurofibromatosis 1 which makes her high risk for multifocal GIST tumors.   She had a relatively long small bowel resection with 28 tumors of varying sizes. These all appeared to be in the jejunum. She had 250 cm of remaining bowel. There were several just that were not able to be resected due to the proximity to the ligament of Treitz. The anastomosis was 10 cm beyond the ligament of Treitz. Beyond the distal resection site there was another small 2 to 3 mm mass. Final path was [id number], stage IIIB, high grade, mitotic rate 9/hpf.   She started Gleevec which is planned to be continued for 3 years. In April 2023 she had an episode of severe abdominal pain with nausea and vomiting that brought her to the emergency department. She ended up seeing Dr. Freida Busman as I was out for medical leave. She had some thickening at the jejunojejunostomy anastomosis as well as a small bowel short segment intussusception and a few small soft tissue nodules. There was consideration for exploring her, but because her symptoms  were improving she had a upper endoscopy with extension just past the ligament of Treitz. She was seen she did not have any evidence of recurrence at the jejunojejunostomy. Her symptoms resolved.  Follow-up scan performed in June 2023 which showed no evidence of recurrence and resolved intussusception. She did have a large right ovarian cyst.  Interval history:   Pt continued on gleevec and tamoxifen and had restaging studies 02/2023. Unfortunately this did show a recurrent mass that is 8 cm. She had MR to try to help distinguish this as bowel vs adnexa and it wasn't clear from that which it was.   She has had no real symptoms. She said maybe occasional cramping, but nothing that felt too out of the ordinary. She hasn't had any n/v.    CT 03/08/23 IMPRESSION: 1. New heterogeneously enhancing right adnexal mass measures 8.1 x 5.9 cm and closely abuts the sigmoid colon. Differential in this Anne Horton with history of neurofibromatosis includes serous ovarian carcinoma, leiomyosarcoma, or GIST. 2. Stable peritoneal nodules and L1 vertebral body sclerotic focus.  MR pelvis 03/12/23 IMPRESSION: 8.2 cm right adnexal mass, which is contiguous with the right ovary and lateral wall of the uterus, and new since CT on 09/04/2022. This is highly suspicious for malignancy, with differential diagnosis including metastatic disease, malignant peripheral nerve sheath tumor, leiomyosarcoma, and less likely ovarian carcinoma.  Small amount of pelvic ascites with peritoneal enhancement, raising suspicion for peritoneal carcinomatosis.  2 cm posterior uterine fibroid.  Left  dx mammogram 02/2023  ACR Breast Density Category c: The breasts are heterogeneously dense, which may obscure small masses.  FINDINGS: Mammogram:  Left breast: A skin BB marks the palpable site of concern in the lower inner left breast. A spot tangential view of this area was performed in addition to standard views. There is no new  abnormality mammographically at the palpable site or elsewhere in the left breast suggest presence of malignancy. There are stable postsurgical changes.  On physical exam at the site of concern in the lower inner left breast I feel a discrete rubbery mass.  Ultrasound:  Targeted ultrasound is performed at the palpable site of concern in the left breast at 7 o'clock 5 cm from the nipple demonstrating no suspicious cystic or solid mass. There is a subtle mixed echogenicity area with fat and bands of echogenic tissue favored to represent fat necrosis which likely accounts for the palpable finding.  IMPRESSION: At the palpable site of concern in the lower inner left breast there is a subtle mixed echogenicity area favored to represent fat necrosis. No suspicious solid mass.  RECOMMENDATION: Diagnostic bilateral mammogram and left breast ultrasound in August 2024.  I have discussed the findings and recommendations with the Anne Horton. If applicable, a reminder letter will be sent to the Anne Horton regarding the next appointment.  BI-RADS CATEGORY 3: Probably benign.   Review of Systems: A complete review of systems was obtained from the Anne Horton. I have reviewed this information and discussed as appropriate with the Anne Horton. See HPI as well for other ROS.  Review of Systems  All other systems reviewed and are negative.   Medical History: Past Medical History:  Diagnosis Date  Anxiety  Arthritis  Breast cancer (CMS/HHS-HCC)  Depression  Neurofibromatosis (CMS/HHS-HCC)   Anne Horton Active Problem List  Diagnosis  Anemia, iron deficiency  GIST (gastrointestinal stromal tumor) of small bowel, malignant (CMS/HHS-HCC)  Malignant neoplasm of upper-outer quadrant of left breast in female, estrogen receptor positive (CMS/HHS-HCC)  Migraine headache  Neurofibromatosis (CMS/HHS-HCC)  Nonrheumatic mitral (valve) insufficiency  Sinus tachycardia  Pelvic mass  Right ovarian cyst    Past Surgical History:  Procedure Laterality Date  Left breast lumpectomy 05/07/2018  Dr. Dwain Sarna  Exploratory Laparotomy with Resection of Abdominal Mass 04/03/2021  Dr. Donell Beers  Small Bowel Resection & Omentectomy 04/03/2021  Dr. Donell Beers  CHOLECYSTECTOMY    Allergies  Allergen Reactions  Iron Sucrose Hives, Rash and Other (See Comments)  Other reaction(s): Respiratory Distress (ALLERGY/intolerance)  Latex, Natural Rubber Itching and Rash   Current Outpatient Medications on File Prior to Visit  Medication Sig Dispense Refill  ALPRAZolam (XANAX) 0.5 MG tablet Take 1/2 to 1 tablet by mouth 2 times a day as needed  cetirizine (ZYRTEC) 10 MG tablet Take 10 mg by mouth once daily  gabapentin (NEURONTIN) 100 MG capsule Take by mouth  multivitamin (MULTIVITAMIN) tablet Take 1 tablet by mouth once daily  ondansetron (ZOFRAN-ODT) 4 MG disintegrating tablet Dissolve 1 tablet by mouth as needed for nausea 20 tablet 0  venlafaxine (EFFEXOR-XR) 75 MG XR capsule Take by mouth  metoprolol succinate (TOPROL-XL) 50 MG XL tablet Take by mouth  propranoloL (INDERAL) 10 MG tablet TAKE 1 TABLET BY MOUTH FOUR TIMES DAILY AS NEEDED FOR PALPITATIONS   No current facility-administered medications on file prior to visit.   Family History  Problem Relation Age of Onset  High blood pressure (Hypertension) Mother  Hyperlipidemia (Elevated cholesterol) Mother  Diabetes Mother  Breast cancer Mother    Social History  Tobacco Use  Smoking Status Never  Smokeless Tobacco Never    Social History   Socioeconomic History  Marital status: Unknown  Tobacco Use  Smoking status: Never  Smokeless tobacco: Never  Substance and Sexual Activity  Alcohol use: Not Currently  Drug use: Not Currently   Social Determinants of Health   Financial Resource Strain: Low Risk (11/17/2020)  Received from [facility] visits prior to 11/16/2022.  Overall Financial Resource Strain (CARDIA)   Difficulty of Paying Living Expenses: Not hard at all  Food Insecurity: No Food Insecurity (11/17/2020)  Received from [facility] visits prior to 11/16/2022.  Hunger Vital Sign  Worried About Running Out of Food in the Last Year: Never true  Ran Out of Food in the Last Year: Never true  Transportation Needs: Unmet Transportation Needs (11/17/2020)  Received from [facility] visits prior to 11/16/2022.  PRAPARE - Risk analyst (Medical): Yes  Lack of Transportation (Non-Medical): Yes  Physical Activity: Inactive (11/17/2020)  Received from [facility] visits prior to 11/16/2022.  Exercise Vital Sign  Days of Exercise per Week: 0 days  Minutes of Exercise per Session: 0 min  Stress: Stress Concern Present (11/17/2020)  Received from [facility] visits prior to 11/16/2022.  [employer] of Occupational Health - Occupational Stress Questionnaire  Feeling of Stress : To some extent  Social Connections: Moderately Isolated (11/17/2020)  Received from [facility] visits prior to 11/16/2022.  Social Advertising account executive [NHANES]  Frequency of Communication with Friends and Family: More than three times a week  Frequency of Social Gatherings with Friends and Family: Anne Horton refused  Attends Religious Services: Never  Database administrator or Organizations: No  Attends Banker Meetings: Anne Horton refused  Marital Status: Married   Objective:   Vitals:  03/18/23 1453  PainSc: 3  PainLoc: Abdomen   There is no height or weight on file to calculate BMI.  Head: Normocephalic and atraumatic.  Eyes: Conjunctivae are normal. Pupils are equal, round, and reactive to light. No scleral icterus.  Neck: Normal range of motion. Neck supple. No tracheal deviation present. No thyromegaly present.  Resp: No respiratory distress, normal effort. Abd: Abdomen  is soft, non distended and non tender. Mild RLQ tenderness and possible fullness. There is no rebound and no guarding.  Neurological: Alert and oriented to person, place, and time. Coordination normal.  Skin: Skin is warm and dry. No rash noted. No diaphoretic. No erythema. No pallor. Numerous neurofibromas.  Psychiatric: Normal mood and affect. Normal behavior. Judgment and thought content normal.   Labs, Imaging and Diagnostic Testing:  Labs 04/09/2022 Mild anemia HCT 34.6 CMET mild hypocalcemia 8.4  Assessment and Plan:   Diagnoses and all orders for this visit:  GIST (gastrointestinal stromal tumor) of small bowel, malignant (CMS/HHS-HCC)  Malignant neoplasm of upper-outer quadrant of left breast in female, estrogen receptor positive (CMS/HHS-HCC)  Neurofibromatosis (CMS/HHS-HCC)  Right ovarian cyst  Pt does appear to have recurrent GIST vs adnexal mass. There was small volume residual disease at the time of surgery and Anne Horton had intussusception at one point last year which may indicate mass on small intestine.   Plan combined surgery with Dr. Pricilla Holm. Dr. Pricilla Holm also plans hysterectomy and BSO.   I suspect she will need bowel resection and so shall have bowel prep.   Discussed risks of surgery (resection of pelvic mass  with possible small or large bowel resection) including bleeding, infection, damage to adjacent structures, leak of bowel connection, recurrent cancer, possible need for additional surgeries or procedures, heart/lung issues, blood clot.  We will attempt minimally invasive surgery this time as the mass is much smaller. If there is too much scarring, will convert to open via prior midline incision.     Matthias Hughs, MD

## 2023-04-09 NOTE — Anesthesia Procedure Notes (Signed)
Anesthesia Procedure Image    

## 2023-04-09 NOTE — Interval H&P Note (Signed)
History and Physical Interval Note:  04/09/2023 7:48 AM  Anne Horton  has presented today for surgery, with the diagnosis of ADNEXAL MASS.  The various methods of treatment have been discussed with the patient and family. After consideration of risks, benefits and other options for treatment, the patient has consented to  Procedure(s): XI ROBOT ASSISTED DIAGNOSTIC LAPAROSCOPY, ROBOTIC VERSES OPEN MASS EXCISION, POSSIBLE TOTAL ROBOTIC HYSTERECTOMY WITH BILATERAL SALPINGO OOPHORECTOMY, POSSIBLE STAGING (N/A) XI ROBOTIC ASSISTED REMOVAL OF PELVIC MASS (N/A) XI ROBOTIC ASSISTED; POSSIBLE OPEN BOWEL RESECTION (N/A) as a surgical intervention.  The patient's history has been reviewed, patient examined, no change in status, stable for surgery.  I have reviewed the patient's chart and labs.  Questions were answered to the patient's satisfaction.     [name]

## 2023-04-09 NOTE — Anesthesia Procedure Notes (Signed)
Procedure Name: Intubation Date/Time: 04/09/2023 9:03 AM  Performed by: Deri Fuelling, CRNAPre-anesthesia Checklist: Patient identified, Emergency Drugs available, Suction available and Patient being monitored Patient Re-evaluated:Patient Re-evaluated prior to induction Oxygen Delivery Method: Circle system utilized Preoxygenation: Pre-oxygenation with 100% oxygen Induction Type: IV induction Ventilation: Mask ventilation without difficulty Laryngoscope Size: Mac and 3 Grade View: Grade I Tube type: Oral Tube size: 7.0 mm Number of attempts: 1 Airway Equipment and Method: Stylet and Oral airway Placement Confirmation: ETT inserted through vocal cords under direct vision, positive ETCO2 and breath sounds checked- equal and bilateral Secured at: 21 cm Tube secured with: Tape Dental Injury: Teeth and Oropharynx as per pre-operative assessment

## 2023-04-09 NOTE — Interval H&P Note (Signed)
History and Physical Interval Note:  04/09/2023 6:47 AM  Anne Horton  has presented today for surgery, with the diagnosis of ADNEXAL MASS.  The various methods of treatment have been discussed with the patient and family. After consideration of risks, benefits and other options for treatment, the patient has consented to  Procedure(s): XI ROBOT ASSISTED DIAGNOSTIC LAPAROSCOPY, ROBOTIC VERSES OPEN MASS EXCISION, POSSIBLE TOTAL ROBOTIC HYSTERECTOMY WITH BILATERAL SALPINGO OOPHORECTOMY, POSSIBLE STAGING (N/A) XI ROBOTIC ASSISTED REMOVAL OF PELVIC MASS (N/A) XI ROBOTIC ASSISTED; POSSIBLE OPEN BOWEL RESECTION (N/A) as a surgical intervention.  The patient's history has been reviewed, patient examined, no change in status, stable for surgery.  I have reviewed the patient's chart and labs.  Questions were answered to the patient's satisfaction.     Carver Fila

## 2023-04-09 NOTE — Transfer of Care (Signed)
Immediate Anesthesia Transfer of Care Note  Patient: Anne Horton [facility]  Procedure(s) Performed: XI ROBOT ASSISTED DIAGNOSTIC LAPAROSCOPY, ROBOTIC assisted OPEN MASS EXCISION, TOTAL ROBOTIC HYSTERECTOMY WITH BILATERAL SALPINGO OOPHORECTOMY, CYSTOSCOPY XI ROBOTIC ASSISTED REMOVAL OF PELVIC MASS; INCIDENTAL LAPAROSCOPIC APPENDECTOMY  Patient Location: PACU  Anesthesia Type:GA combined with regional for post-op pain  Level of Consciousness: awake, alert , and oriented  Airway & Oxygen Therapy: Patient Spontanous Breathing and Patient connected to face mask oxygen  Post-op Assessment: Report given to RN and Post -op Vital signs reviewed and stable  Post vital signs: Reviewed and stable  Last Vitals:  Vitals Value Taken Time  BP 111/47 04/09/23 1347  Temp    Pulse 97 04/09/23 1353  Resp 21 04/09/23 1353  SpO2 100 % 04/09/23 1353  Vitals shown include unfiled device data.  Last Pain:  Vitals:   04/09/23 0552  TempSrc:   PainSc: 3       Patients Stated Pain Goal: 4 (04/09/23 0552)  Complications: No notable events documented.

## 2023-04-09 NOTE — Plan of Care (Signed)

## 2023-04-09 NOTE — Op Note (Signed)
PRE-OPERATIVE DIAGNOSIS: malignant pelvic mass, personal history of GIST  POST-OPERATIVE DIAGNOSIS:  Same  PROCEDURE:  Procedure(s): Robotic resection of malignant pelvic mass (8-10 cm), incidental appendectomy  SURGEON:  Surgeon(s): Anne Lint, MD  ASSIST:  Eugene Garnet, MD  ANESTHESIA:   local and general  DRAINS: none   SPECIMEN:  Source of Specimen:  right pelvic mass en bloc with right ovary Left tumor capsule  Left pelvic peritoneal nodules Right pelvic side wall lesions)  Left ovary (per Dr. Pricilla Holm) Appendix.  DISPOSITION OF SPECIMEN:  PATHOLOGY  COUNTS:  not yet complete as additional procedure ongoing  DICTATION: .Dragon Dictation  PLAN OF CARE: Admit to inpatient   PATIENT DISPOSITION:  PACU - hemodynamically stable.  FINDINGS:  very friable mass, bloody, fluid filled  EBL: 200 mL  PROCEDURE:   Patient was identified in the holding area and was taken to the operating room where she was placed supine on the operating room table.  General anesthesia was induced.  She was then placed in the low lithotomy position.  Timeout was performed according to the surgical safety checklist.  When all was correct, Dr. Pricilla Holm continued.  Dr. Pricilla Holm gained access to the abdomen and had 3 ports placed.  She was lysing adhesions.  I came in to enter and assist at that point.  Repeat timeout was performed for my portion of the case.  There were numerous adhesions to the abdominal wall that were taken down sharply and with the Enseal once we got to a mental section.  Once all of the adhesions were taken down off of the anterior abdominal wall in the lower abdomen and pelvis the robotic ports were placed and the robot was docked.  One of the 3 ports that had previously been placed and were converted to a robotic port.  The camera port was converted to a air seal port.  The mass was identified and this was gently dissected away from the surrounding tissues.  It appeared to be  arising from a tumor implant to the ovary or the peritoneum.  The retroperitoneum was dissected away from the tumor.  The sigmoid colon mesentery was dissected away from the tumor.  Dr. Pricilla Holm dissected away the ovary from the surrounding tissues and left that en bloc with the tumor.  Dr. Pricilla Holm also robotically checked the left tube and ovary from the uterus.  The mass was extraordinarily friable and even gentle pressure disrupted the tumor capsule.  Once the mass was nearly completely dissected away from the surrounding tissues, the inferior aspect of her wound was opened and a wound protector placed.  The mass and ovary were removed after dissecting away 1 small posterior band with the cautery.  The ovaries and the mass were sent for frozen section to evaluate the ovarian pathology.  There were a few smaller portions of tumor that were also removed that had broken off of the primary tumor.  Gloves were changed.  The pelvis was irrigated with water.    The cap was placed on the wound protector and insufflation was reachieved.  We reexamined the pelvis and found several additional small nodules on the peritoneum.  These were dissected sharply with scissors away from the peritoneum and sent for permanent section.  Because of the proximity of the appendix to the tumor, decision was made to perform an appendectomy.  It was felt that recurrent tumor could make potential therapeutic appendectomy in the future much more difficult.  The mesoappendix was dissected with the  Enseal and a Endo GIA stapler was used to divide the base of the appendix at the cecum.  There was no evidence of bleeding at the staple line.  The frozen section returned back as indeterminate between GIST and granulosa tumor.  Because of the indeterminant pathology, Dr. Pricilla Holm elected to perform a robotic hysterectomy.  The robot was read docked and I exited the procedure at this point.

## 2023-04-09 NOTE — Op Note (Signed)
OPERATIVE NOTE  Pre-operative Diagnosis: Adnexal mass  Post-operative Diagnosis: same, right adnexal mass (densely adherent to vs involving the right ovary(, simple appearing left ovarian cyst, intra-abdominal adhesions  Operation: Laparoscopic lysis of adhesions, robotic-assisted laparoscopic total hysterectomy with bilateral salpingo-oophorectomy, excision of pelvic mass requiring mini-lap (Dr. Donell Beers), appendectomy (Dr. Donell Beers)  Surgeon: Eugene Garnet MD  Assistant Surgeon: Warner Mccreedy, NP (an NP assistant was necessary for tissue manipulation, management of robotic instrumentation, retraction and positioning due to the complexity of the case and hospital policies).   Anesthesia: GET  Urine Output: 200 cc  Operative Findings: On EUA, small mobile uterus.  On intra-abdominal entry, significant adhesions of small bowel loops and omentum noted to the anterior abdominal wall at the site of prior midline laparotomy incision.  Normal-appearing omentum.  Once lysis of adhesions had been performed, left ovary noted to have a 3-4 cm simple appearing cyst.  Normal-appearing bilateral tubes.  Approximately 2 cm area of normal ovarian tissue with an 8-10 cm mass that was either arising from the ovary or densely adherent to it.  Mass with adhesions to the sigmoid and rectal mesentery, posterior uterus, and right pelvic sidewall.  Uterus itself was approximately 8 cm and normal in appearance with several areas of endosalpingosis versus small tumor implants on the left fundus.  Dense adhesions between the bladder and lower uterine segment/cervix consistent with prior C-section history.  Several small, less than 1 cm implants noted on the right and left pelvic peritoneum (excised).  No ascites. Present section consistent with a stromal tumor, unable to differentiate recurrent GIST versus GYN stromal tumor such as granulosa cell tumor.  Estimated Blood Loss:  400 cc      Total IV Fluids: see I&O  flowsheet         Specimens: uterus, cervix, bilateral tubes and ovaries with right removed en bloc with pelvic mass, pelvic washings, peritoneal implants, appendix         Complications:  None apparent; patient tolerated the procedure well.         Disposition: PACU - hemodynamically stable.  Procedure Details  The patient was seen in the Holding Room. The risks, benefits, complications, treatment options, and expected outcomes were discussed with the patient.  The patient concurred with the proposed plan, giving informed consent.  The site of surgery properly noted/marked. The patient was identified as Hector Shade and the procedure verified as a Robotic-assisted resection of pelvic mass, BSO, any other indicated procedures.   After induction of anesthesia, the patient was draped and prepped in the usual sterile manner. Patient was placed in supine position after anesthesia and draped and prepped in the usual sterile manner as follows: Her arms were tucked to her side with all appropriate precautions.  The patient was secured to the bed using padding and tape across her chest.  The patient was placed in the semi-lithotomy position in [place] stirrups.  The perineum and vagina were prepped with Betadine. The patient's abdomen was prepped with ChloraPrep and then she was draped after the prep had been allowed to dry for 3 minutes.  A Time Out was held and the above information confirmed.  The urethra was prepped with Betadine. Foley catheter was placed.  A sterile speculum was placed in the vagina.  The cervix was grasped with a single-tooth tenaculum. The cervix was dilated with Shawnie Pons dilators.  The ZUMI uterine manipulator with a medium colpotomizer ring was placed without difficulty.  A pneum occluder balloon was placed over the  manipulator.  OG tube placement was confirmed and to suction.   Next, a 5 mm skin incision was made 1 cm below the subcostal margin in the midclavicular line.  The 5 mm  Optiview port and scope was used for direct entry.  Opening pressure was under 10 mm CO2.  The abdomen was insufflated and the findings were noted as above.   At this point and all points during the procedure, the patient's intra-abdominal pressure did not exceed 15 mmHg.  Two 5 mm skin incisions were made in the left mid abdomen given adhesions of the small bowel to the anterior abdominal wall along the midline.  Using sharp dissection and rare monopolar electrocautery went away from bowel wall, began mobilizing small bowel adhesions.  Dr. Donell Beers joined me at this time and continued this dissection until all of the small bowel had been freed from the mid abdomen.  The left lateral 5 mm skin incision was extended to an 8 mm skin incision and 3 additional incisions were made, 1 in the left abdomen, 2 in the right abdomen.  8 mm robotic ports were placed in the left lateral incision and 2 right-sided incisions.  The left more medial incision was made to accommodate a 12 mm robotic trocar. All ports were placed under direct visualization.  The patient was placed in steep Trendelenburg.  The robot was docked in the normal manner.  Dr. Donell Beers began by mobilizing the superior aspect of the mass.  At this time, it became apparent that either the mass was part of the ovary or densely adherent to it.  I then opened the right retroperitoneum parallel to the IP ligament to open the retroperitoneal space.  The round ligament was preserved initially.  The ureter was noted to be on the medial leaf of the broad ligament.  Ultimately the mass was mobilized sufficiently to skeletonize, cauterized, and cut the infundibulopelvic ligament.  The utero-ovarian ligament and fallopian tube were then isolated, cauterized, and transected lateral to the fundus.  With major blood supply taken, Dr. Donell Beers turned her attention again to mobilization of the mass.  It was free of the sigmoid but quite adherent to the sigmoid mesentery and  right pelvic sidewall peritoneum.  Combination of blunt dissection and monopolar electrocautery were used to transect adhesions to the lower uterine segment.  Because the tumor began to fracture given its somewhat gelatinous and fragile nature, we ultimately decided to proceed with a mini lap for excision.    First, I proceeded with removal of the left tube and ovary.  The left peritoneum was opened parallel to the IP ligament to open the retroperitoneal space.  The round ligament was spared. The ureter was noted to be on the medial leaf of the broad ligament.  The peritoneum above the ureter was incised and stretched and the infundibulopelvic ligament was skeletonized, cauterized and cut.  Utero-ovarian and fallopian tube were isolated, cauterized, and transected just adjacent to the uterine fundus, freeing the left adnexa.  This was placed in an Endo Catch bag and handed out after the laparotomy was made.  All instruments were removed and the robot was undocked.  A small infraumbilical mini lap incision was made with a scalpel and carried down to and through the fascia with monopolar electrocautery.  Peritoneal incision was then extended.  Small Alexis retractor was placed.  See Dr. Arita Miss notes for removal of the mass.  Laparoscopic cap was placed and the abdomen was reinsufflated.  Examined the pelvis  again and noted several residual areas of tumor along the sigmoid mesentery as well as several small tumor implants along the pelvic peritoneum.  These were all excised and sent for permanent section.  Dr. Donell Beers then performed an appendectomy given tumor's proximity to the appendix.  The robot was redocked.  At this point, the frozen section returned showing a stromal tumor, possibly recurrent GIST versus granulosa cell or other GYN stromal tumor.  Decision was made to proceed with total hysterectomy.  Bilateral round ligaments were transected.  The posterior peritoneum was taken down to the level of  the KOH ring.  The anterior peritoneum was also taken down.  The bladder flap was created to the level of the KOH ring.  This was performed with some difficulty given dense adhesions of the bladder to the lower uterine segment and cervix.  The uterine artery on the right side was skeletonized, cauterized and cut in the normal manner.  A similar procedure was performed on the left.  The colpotomy was made and the uterus, cervix, bilateral ovaries and tubes were amputated and delivered through the vagina.  Pedicles were inspected and excellent hemostasis was achieved.    The colpotomy at the vaginal cuff was closed with 0 Vicryl using a figure-of-eight at each apex and 0 V-Loc to close the midportion of the cuff in a running manner.  Irrigation was used and excellent hemostasis was achieved.  Given raw surfaces of the pelvic peritoneum after mass excision, Floseal and Arista was placed on the surgical beds.  At this point in the procedure was completed.  A 90F JP drain was brought in through the right trocar incision, and a drain stitch was used to secure it to the skin after the trocar was removed.  Robotic instruments were removed under direct visulaization.  The robot was undocked. The fascia at the 12 mm port was closed with 0 Vicryl for direct visualization using a PMI fascial closure device.    The incision was then closed with running #1 looped PDS tied in the midline. The subcutaneous tissue was irrigated and hemostasis achieved. Exparel was injected for local anesthesia. The subcutaneous tissue was closed with 2-0 Vicyrl in running fashion.   The fascia at the 10-12 mm port was closed with 0 Vicryl on a UR-5 needle.  The subcuticular tissue of all incisions was closed with 4-0 Vicryl and the skin was closed with 4-0 Monocryl in a subcuticular manner.  Dermabond was applied.    All sponge, lap and needle counts were correct x  3.   The patient was transferred to the recovery room in stable  condition.  Eugene Garnet, MD

## 2023-04-09 NOTE — Anesthesia Postprocedure Evaluation (Signed)
Anesthesia Post Note  Patient: Anne Horton  Procedure(s) Performed: XI ROBOT ASSISTED DIAGNOSTIC LAPAROSCOPY, ROBOTIC assisted OPEN MASS EXCISION, TOTAL ROBOTIC HYSTERECTOMY WITH BILATERAL SALPINGO OOPHORECTOMY, CYSTOSCOPY XI ROBOTIC ASSISTED REMOVAL OF PELVIC MASS; INCIDENTAL LAPAROSCOPIC APPENDECTOMY     Patient location during evaluation: PACU Anesthesia Type: General Level of consciousness: awake and alert Pain management: pain level controlled Vital Signs Assessment: post-procedure vital signs reviewed and stable Respiratory status: spontaneous breathing, nonlabored ventilation, respiratory function stable and patient connected to nasal cannula oxygen Cardiovascular status: blood pressure returned to baseline and stable Postop Assessment: no apparent nausea or vomiting Anesthetic complications: no  No notable events documented.  Last Vitals:  Vitals:   04/09/23 1430 04/09/23 1445  BP: 114/61 (!) 101/55  Pulse: 95 97  Resp: 16 15  Temp:    SpO2: 99% 98%    Last Pain:  Vitals:   04/09/23 1445  TempSrc:   PainSc: 8                  Zoran Yankee S

## 2023-04-09 NOTE — Brief Op Note (Signed)
04/09/2023  1:30 PM  PATIENT:  Anne Horton  47 y.o. female  PRE-OPERATIVE DIAGNOSIS:  ADNEXAL MASS  POST-OPERATIVE DIAGNOSIS:  ADNEXAL MASS  PROCEDURE:  Procedure(s): XI ROBOT ASSISTED DIAGNOSTIC LAPAROSCOPY, ROBOTIC assisted OPEN MASS EXCISION, TOTAL ROBOTIC HYSTERECTOMY WITH BILATERAL SALPINGO OOPHORECTOMY, CYSTOSCOPY (N/A) XI ROBOTIC ASSISTED REMOVAL OF PELVIC MASS; INCIDENTAL LAPAROSCOPIC APPENDECTOMY (N/A)  SURGEON:  Surgeons and Role: Panel 1:    Carver Fila, MD - Primary Panel 2:    * [name], MD - Primary  ASSISTANTS: Warner Mccreedy NP   ANESTHESIA:   general  EBL:  400 mL   BLOOD ADMINISTERED:none  DRAINS: 25F JP   LOCAL MEDICATIONS USED:  MARCAINE     SPECIMEN:  pelvic washings, uterus, cervix, bilateral tubes and ovaries  DISPOSITION OF SPECIMEN:  PATHOLOGY  COUNTS:  YES  TOURNIQUET:  * No tourniquets in log *  DICTATION: .Note written in EPIC  PLAN OF CARE: Admit to inpatient   PATIENT DISPOSITION:  PACU - hemodynamically stable.   Delay start of Pharmacological VTE agent (>24hrs) due to surgical blood loss or risk of bleeding: no

## 2023-04-10 ENCOUNTER — Encounter (HOSPITAL_COMMUNITY): Payer: Self-pay | Admitting: Gynecologic Oncology

## 2023-04-10 LAB — CBC
HCT: 27.5 % — ABNORMAL LOW (ref 36.0–46.0)
Hemoglobin: 8.8 g/dL — ABNORMAL LOW (ref 12.0–15.0)
MCHC: 32 g/dL (ref 30.0–36.0)
MCV: 98.6 fL (ref 80.0–100.0)
Platelets: 258 10*3/uL (ref 150–400)
RBC: 2.79 MIL/uL — ABNORMAL LOW (ref 3.87–5.11)
RDW: 12.5 % (ref 11.5–15.5)
WBC: 19 10*3/uL — ABNORMAL HIGH (ref 4.0–10.5)
nRBC: 0 % (ref 0.0–0.2)

## 2023-04-10 LAB — BASIC METABOLIC PANEL
Anion gap: 10 (ref 5–15)
BUN: <5 mg/dL — ABNORMAL LOW (ref 6–20)
CO2: 20 mmol/L — ABNORMAL LOW (ref 22–32)
Calcium: 7.7 mg/dL — ABNORMAL LOW (ref 8.9–10.3)
Chloride: 109 mmol/L (ref 98–111)
Creatinine, Ser: 0.62 mg/dL (ref 0.44–1.00)
GFR, Estimated: >60 mL/min (ref >60)
Glucose, Bld: 168 mg/dL — ABNORMAL HIGH (ref 70–99)
Sodium: 139 mmol/L (ref 135–145)

## 2023-04-10 MED ORDER — CHLORHEXIDINE GLUCONATE CLOTH 2 % EX PADS
6.0000 | MEDICATED_PAD | Freq: Every day | CUTANEOUS | Status: DC
  Administered 2023-04-11: 6 via TOPICAL

## 2023-04-10 MED ORDER — CYCLOBENZAPRINE HCL 5 MG PO TABS
5.0000 mg | ORAL_TABLET | Freq: Three times a day (TID) | ORAL | Status: DC | PRN
  Administered 2023-04-10 – 2023-04-12 (×6): 5 mg via ORAL
  Filled 2023-04-10 (×6): qty 1

## 2023-04-10 NOTE — Progress Notes (Signed)
GYN Oncology Progress Note  Patient alert, oriented, resting in the chair in no acute distress. States the flexeril is helping with the abdominal discomfort. Still having moderate incision pain with movement and discomfort from the JP and foley. Had a very small BM. No flatus and reports belching intermittently. Ambulating in the halls without dizziness. No needs or concerns voiced at this time.

## 2023-04-10 NOTE — Progress Notes (Signed)
1 Day Post-Op Procedure(s) (LRB): XI ROBOT ASSISTED DIAGNOSTIC LAPAROSCOPY, ROBOTIC assisted OPEN MASS EXCISION, TOTAL ROBOTIC HYSTERECTOMY WITH BILATERAL SALPINGO OOPHORECTOMY, CYSTOSCOPY (N/A) XI ROBOTIC ASSISTED REMOVAL OF PELVIC MASS; INCIDENTAL LAPAROSCOPIC APPENDECTOMY (N/A)  Subjective: Patient reports moderate incisional pain. Pt wondering if muscle related. No dizziness when out of bed. No nausea or emesis. Had some light spotting when attempting to use the bathroom overnight. Husband at the bedside. No concerns voiced.  Objective: Vital signs in last 24 hours: Temp:  [97.6 F (36.4 C)-98.2 F (36.8 C)] 97.6 F (36.4 C) (07/25 0552) Pulse Rate:  [83-103] 92 (07/25 0552) Resp:  [12-22] 15 (07/25 0552) BP: (95-122)/(47-62) 118/54 (07/25 0552) SpO2:  [92 %-100 %] 98 % (07/25 0552) Last BM Date : 04/09/23  Intake/Output from previous day: 07/24 0701 - 07/25 0700 In: 5249.9 [P.O.:790; I.V.:3659.9; IV Piggyback:800] Out: 4690 [Urine:3825; Drains:465; Blood:400]  Physical Examination: General: alert, cooperative, and no distress Resp: clear to auscultation bilaterally Cardio: regular rate and rhythm, S1, S2 normal, no murmur, click, rub or gallop GI: incision: abdominal lap site incisions intact with dermabond present, midline abdominal incision with op site dressing in place with no active drainage noted underneath, JP drain with serosanguinous drainage, stripped by Dr. Pricilla Holm and abdomen soft, hypoactive bowel sounds, non distended  Extremities: extremities normal, atraumatic, no cyanosis or edema SCDs on Foley in place with clear, yellow urine  Labs: WBC/Hgb/Hct/Plts:  19.0/8.8/27.5/258 (07/25 0418) BUN/Cr/glu/ALT/AST/amyl/lip:  <5/0.62/--/--/--/--/-- (07/25 0418)  Assessment: 47 y.o. s/p Procedure(s): XI ROBOT ASSISTED DIAGNOSTIC LAPAROSCOPY, ROBOTIC assisted OPEN MASS EXCISION, TOTAL ROBOTIC HYSTERECTOMY WITH BILATERAL SALPINGO OOPHORECTOMY, CYSTOSCOPY XI ROBOTIC  ASSISTED REMOVAL OF PELVIC MASS; INCIDENTAL LAPAROSCOPIC APPENDECTOMY: stable Pain:  Pain is well-controlled on PRN medications.  Heme: Hgb 8.8 and Hct 27.5 this am. Appropriate given preop values and surgical losses. Continue to monitor.   ID: WBC 19.0 this am. Given decadron and intraoperative antibiotics. Felt to be reactive. Continue to monitor.  CV: BP and HR stable. Continue to monitor with ordered vital signs.  GI:  Tolerating po: Yes. Antiemetics ordered if needed.  GU: Foley in place. Creatinine at 0.62. Plan to keep foley in for another day given dissection near the bladder during surgery.    FEN: No critical values on am labs.  Prophylaxis: SCDs and lovenox ordered.  Plan: IV to [name] Diet as tolerated AM labs Encourage IS use, increasing mobility Continue monitoring intake/output Continue plan of care per Dr. Pricilla Holm   LOS: 1 day    Anne Horton D Anne Horton 04/10/2023, 7:19 AM

## 2023-04-10 NOTE — Progress Notes (Signed)
1 Day Post-Op   Subjective/Chief Complaint: Pt was sore last night.  Abdominal binder was helpful.  She is trying muscle relaxant this AM.  She isn't having flatus or BM yet, but also isn't having n/v or belching.     Objective: Vital signs in last 24 hours: Temp:  [97.6 F (36.4 C)-98.4 F (36.9 C)] 98.4 F (36.9 C) (07/25 0941) Pulse Rate:  [83-103] 91 (07/25 0941) Resp:  [12-22] 18 (07/25 0941) BP: (95-122)/(47-62) 102/54 (07/25 0941) SpO2:  [92 %-100 %] 100 % (07/25 0941) Last BM Date : 04/09/23  Intake/Output from previous day: 07/24 0701 - 07/25 0700 In: 5249.9 [P.O.:790; I.V.:3659.9; IV Piggyback:800] Out: 4690 [Urine:3825; Drains:465; Blood:400] Intake/Output this shift: Total I/O In: 240 [P.O.:240] Out: 430 [Urine:400; Drains:30]  General appearance: alert, cooperative, and no distress Resp: breathing comfortably GI: soft, non distended, wounds c/d/I.   Lab Results:  Recent Labs    04/09/23 1755 04/10/23 0418  WBC 14.6* 19.0*  HGB 8.3* 8.8*  HCT 25.6* 27.5*  PLT 249 258   BMET Recent Labs    04/09/23 1755 04/10/23 0418  NA 138 139  K 4.0 3.9  CL 109 109  CO2 20* 20*  GLUCOSE 179* 168*  BUN 7 <5*  CREATININE 0.68 0.62  CALCIUM 7.3* 7.7*   PT/INR No results for input(s): "LABPROT", "INR" in the last 72 hours. ABG No results for input(s): "PHART", "HCO3" in the last 72 hours.  Invalid input(s): "PCO2", "PO2"  Studies/Results: No results found.  Anti-infectives: Anti-infectives (From admission, onward)    Start     Dose/Rate Route Frequency Ordered Stop   04/09/23 1145  metroNIDAZOLE (FLAGYL) IVPB 500 mg        500 mg 100 mL/hr over 60 Minutes Intravenous  Once 04/09/23 1122 04/09/23 1137   04/09/23 0600  ceFAZolin (ANCEF) IVPB 2g/100 mL premix        2 g 200 mL/hr over 30 Minutes Intravenous On call to O.R. 04/09/23 0514 04/09/23 1243       Assessment/Plan: s/p Procedure(s): XI ROBOT ASSISTED DIAGNOSTIC LAPAROSCOPY, ROBOTIC  assisted OPEN MASS EXCISION, TOTAL ROBOTIC HYSTERECTOMY WITH BILATERAL SALPINGO OOPHORECTOMY, CYSTOSCOPY (N/A) XI ROBOTIC ASSISTED REMOVAL OF PELVIC MASS; INCIDENTAL LAPAROSCOPIC APPENDECTOMY (N/A) Foley to continue to do issues with getting the uterus off the bladder Diet advancing Pain control Incentive spirometry Hopefully home tomorrow depending on diet and pain control   LOS: 1 day    [name] 04/10/2023

## 2023-04-10 NOTE — Plan of Care (Signed)

## 2023-04-10 NOTE — Plan of Care (Signed)
  Problem: Nutrition: Goal: Adequate nutrition will be maintained Outcome: Progressing   Problem: Clinical Measurements: Goal: Ability to maintain clinical measurements within normal limits will improve Outcome: Progressing   Problem: Activity: Goal: Risk for activity intolerance will decrease Outcome: Progressing   Problem: Nutrition: Goal: Adequate nutrition will be maintained Outcome: Progressing

## 2023-04-10 NOTE — Progress Notes (Signed)
Transition of Care [facility]) - Inpatient Brief Assessment   Patient Details  Name: Anne Horton MRN: [id number] Date of Birth: [birth year]  [facility] CM/SW Contact:    Coralyn Helling, LCSW Phone Number: 04/10/2023, 9:36 AM   Clinical Narrative: No needs identified at the time of screening.  TOC consulted for med assist. Patient denies needing assistance with meds.  Patient has [employer] and does not qualify for med assistance.    Transition of Care Asessment: Insurance and Status: (P) Insurance coverage has been reviewed Patient has primary care physician: (P) Yes Home environment has been reviewed: (P) Home with spouse Prior level of function:: (P) Independent Prior/Current Home Services: (P) No current home services Social Determinants of Health Reivew: (P) SDOH reviewed no interventions necessary Readmission risk has been reviewed: (P) Yes Transition of care needs: (P) no transition of care needs at this time

## 2023-04-11 ENCOUNTER — Encounter: Payer: Self-pay | Admitting: Hematology and Oncology

## 2023-04-11 ENCOUNTER — Other Ambulatory Visit (HOSPITAL_COMMUNITY): Payer: Self-pay

## 2023-04-11 LAB — CBC: MCH: 31 pg (ref 26.0–34.0)

## 2023-04-11 LAB — BASIC METABOLIC PANEL: Chloride: 107 mmol/L (ref 98–111)

## 2023-04-11 MED ORDER — APIXABAN 2.5 MG PO TABS
2.5000 mg | ORAL_TABLET | Freq: Two times a day (BID) | ORAL | 0 refills | Status: DC
  Filled 2023-04-11: qty 52, 26d supply, fill #0

## 2023-04-11 MED ORDER — DIPHENHYDRAMINE HCL 25 MG PO CAPS
25.0000 mg | ORAL_CAPSULE | Freq: Four times a day (QID) | ORAL | Status: DC | PRN
  Administered 2023-04-11 – 2023-04-12 (×2): 25 mg via ORAL
  Filled 2023-04-11 (×2): qty 1

## 2023-04-11 MED ORDER — CHEWING GUM (ORBIT) SUGAR FREE
1.0000 | CHEWING_GUM | Freq: Three times a day (TID) | ORAL | Status: DC
  Administered 2023-04-11 – 2023-04-12 (×3): 1 via ORAL
  Filled 2023-04-11: qty 1

## 2023-04-11 MED ORDER — CYCLOBENZAPRINE HCL 5 MG PO TABS
5.0000 mg | ORAL_TABLET | Freq: Three times a day (TID) | ORAL | 0 refills | Status: DC | PRN
  Filled 2023-04-11: qty 30, 10d supply, fill #0

## 2023-04-11 NOTE — Plan of Care (Signed)
  Problem: Elimination: Goal: Will not experience complications related to bowel motility Outcome: Progressing   Problem: Elimination: Goal: Will not experience complications related to bowel motility Outcome: Progressing   Problem: Elimination: Goal: Will not experience complications related to bowel motility Outcome: Progressing   Problem: Education: Goal: Knowledge of General Education information will improve Description: Including pain rating scale, medication(s)/side effects and non-pharmacologic comfort measures Outcome: Adequate for Discharge   Problem: Health Behavior/Discharge Planning: Goal: Ability to manage health-related needs will improve Outcome: Adequate for Discharge   Problem: Clinical Measurements: Goal: Ability to maintain clinical measurements within normal limits will improve Outcome: Adequate for Discharge   Problem: Activity: Goal: Risk for activity intolerance will decrease Outcome: Adequate for Discharge   Problem: Elimination: Goal: Will not experience complications related to urinary retention Outcome: Adequate for Discharge

## 2023-04-11 NOTE — Discharge Instructions (Addendum)
AFTER SURGERY INSTRUCTIONS   Return to work: 4-6 weeks if applicable   PLAN ON STARTING ELIQUIS (BLOOD THINNER) AFTER SURGERY. If you received the blood thinner injection lovenox before leaving the hospital, then plan on starting the Eliquis the next day.  YOU WILL TAKE ELIQUIS TWICE DAILY for a total of 4 weeks after surgery. THIS IS A BLOOD THINNER TO PREVENT BLOOD CLOTS SO YOU WILL NEED TO MONITOR FOR BLEEDING AND CALL THE OFFICE.  AVOID USE OF NSAIDS (IBUPROFEN, NAPROXEN) WHILE TAKING THE BLOOD THINNER.   Do not take tramadol and oxycodone together. Use one or the other.   Activity: 1. Be up and out of the bed during the day.  Take a nap if needed.  You may walk up steps but be careful and use the hand rail.  Stair climbing will tire you more than you think, you may need to stop part way and rest.    2. No lifting or straining for 6 weeks over 10 pounds. No pushing, pulling, straining for 6 weeks.   3. No driving for around 1-61 days.  Do not drive if you are taking narcotic pain medicine and make sure that your reaction time has returned.    4. You can shower as soon as the next day after surgery. Shower daily.  Use your regular soap and water (not directly on the incision) and pat your incision(s) dry afterwards; don't rub.  No tub baths or submerging your body in water until cleared by your surgeon. If you have the soap that was given to you by pre-surgical testing that was used before surgery, you do not need to use it afterwards because this can irritate your incisions.    5. No sexual activity and nothing in the vagina for 10-12 weeks since you had a hysterectomy.   6. You may experience a small amount of clear drainage from your incisions, which is normal.  If the drainage persists, increases, or changes color please call the office.   7. Do not use creams, lotions, or ointments such as neosporin on your incisions after surgery until advised by your surgeon because they can cause  removal of the dermabond glue on your incisions.     8. You may experience vaginal spotting after surgery or when the stitches at the top of the vagina begin to dissolve (if you have a hysterectomy).  The spotting is normal but if you experience heavy bleeding, call our office.   9. Take Tylenol first for pain if you are able to take these medication and only use narcotic pain medication for severe pain not relieved by the Tylenol.  Monitor your Tylenol intake to a max of 4,000 mg in a 24 hour period.    Diet: 1. Low sodium Heart Healthy Diet is recommended but you are cleared to resume your normal (before surgery) diet after your procedure.   2. It is safe to use a laxative, such as Miralax or Colace, if you have difficulty moving your bowels. You can take Sennakot-S to take at bedtime every evening after surgery to keep bowel movements regular and to prevent constipation. IF YOU HAVE BOWEL SURGERY, THESE RECOMMENDATIONS MAY CHANGE.    Wound Care: 1. Keep clean and dry.  Shower daily.   Reasons to call the Doctor: Fever - Oral temperature greater than 100.4 degrees Fahrenheit Foul-smelling vaginal discharge Difficulty urinating Nausea and vomiting Increased pain at the site of the incision that is unrelieved with pain medicine. Difficulty breathing  with or without chest pain New calf pain especially if only on one side Sudden, continuing increased vaginal bleeding with or without clots.   Contacts: For questions or concerns you should contact:   Dr. Eugene Garnet at [phone]   Warner Mccreedy, NP at [phone]   After Hours: call [phone] and have the GYN Oncologist paged/contacted (after 5 pm or on the weekends). You will speak with an after hours RN and let he or she know you have had surgery.   Messages sent via mychart are for non-urgent matters and are not responded to after hours so for urgent needs, please call the after hours number.

## 2023-04-11 NOTE — Progress Notes (Signed)
2 Days Post-Op Procedure(s) (LRB): XI ROBOT ASSISTED DIAGNOSTIC LAPAROSCOPY, ROBOTIC assisted OPEN MASS EXCISION, TOTAL ROBOTIC HYSTERECTOMY WITH BILATERAL SALPINGO OOPHORECTOMY, CYSTOSCOPY (N/A) XI ROBOTIC ASSISTED REMOVAL OF PELVIC MASS; INCIDENTAL LAPAROSCOPIC APPENDECTOMY (N/A)  Subjective: Patient reports having a rough night with abdominal pain. She has been tolerating her diet. No nausea or emesis. Ambulating. Has not had flatus. Husband at the bedside. No concerns voiced.  Objective: Vital signs in last 24 hours: Temp:  [98.2 F (36.8 C)-99 F (37.2 C)] 98.2 F (36.8 C) (07/26 0603) Pulse Rate:  [85-91] 85 (07/26 0603) Resp:  [18] 18 (07/26 0603) BP: (102-124)/(54-63) 118/54 (07/26 0603) SpO2:  [98 %-100 %] 98 % (07/26 0603) Last BM Date : 04/10/23  Intake/Output from previous day: 07/25 0701 - 07/26 0700 In: 1043.9 [P.O.:480; I.V.:563.9] Out: 2440 [Urine:2300; Drains:140]  Physical Examination: General: alert, cooperative, and no distress Resp: clear to auscultation bilaterally Cardio: regular rate and rhythm, S1, S2 normal, no murmur, click, rub or gallop GI: incision: abdominal lap site incisions intact with dermabond present, midline abdominal incision with op site dressing in place with no active drainage noted underneath, JP drain with minimal serosanguinous drainage, stripped by Dr. Pricilla Holm and abdomen soft, hypoactive bowel sounds, non distended  Extremities: extremities normal, atraumatic, no cyanosis or edema SCDs on Foley in place with clear, yellow urine  Labs: WBC/Hgb/Hct/Plts:  13.4/9.6/29.6/287 (07/26 0422) BUN/Cr/glu/ALT/AST/amyl/lip:  <5/0.65/--/--/--/--/-- (07/26 0422)  Assessment: 47 y.o. s/p Procedure(s): XI ROBOT ASSISTED DIAGNOSTIC LAPAROSCOPY, ROBOTIC assisted OPEN MASS EXCISION, TOTAL ROBOTIC HYSTERECTOMY WITH BILATERAL SALPINGO OOPHORECTOMY, CYSTOSCOPY XI ROBOTIC ASSISTED REMOVAL OF PELVIC MASS; INCIDENTAL LAPAROSCOPIC APPENDECTOMY: stable Pain:   Pain is well-controlled on PRN medications.  Heme: Hgb 9.6 from 8.8 and Hct 29.6 from 27.5. Appropriate given preop values and surgical losses. Continue to monitor.   ID: WBC 13.4 from 19.0 yesterday am. Given decadron and intraoperative antibiotics. Felt to be reactive. Continue to monitor.  CV: BP and HR stable. Continue to monitor with ordered vital signs.  GI:  Tolerating po: Yes. Antiemetics ordered if needed.  GU: Foley in place. Creatinine at 0.65. Plan for foley removal today.    FEN: No critical values on am labs.  Prophylaxis: SCDs and lovenox ordered.  Plan: IV to saline lock Discontinue foley Plan for JP drain removal later today Diet as tolerated If pt passes flatus, plan for possible discharge later today Continue plan of care per Dr. Pricilla Holm   LOS: 2 days    Anne Horton 04/11/2023, 7:11 AM

## 2023-04-11 NOTE — Progress Notes (Signed)
GYN Oncology Progress Note  Patient alert, oriented, in no acute distress, resting in bed with husband at the bedside. Pain medication administered prior to visit by RN. On abdominal exam, mild erythema noted around each incision. Patient states she is having itching in these areas. Op site removed from the midline incision with mild erythema present there as well. No evidence of infection, appears reactive. Patient has had dermabond in the past on incisions without issues.   Right abdominal JP drain removed without difficulty. Steri strips applied and dry dressing applied. Plan for closing this with dermabond deferred given mild reaction on incisions where dermabond is present currently. Benadryl ordered. No flatus reported. Having abdominal soreness. Had nausea after eating pizza earlier today. Voiding since foley removal. Continue plan of care. When meeting milestones with evidence of bowel function return, patient will be discharged home.

## 2023-04-12 NOTE — Progress Notes (Signed)
3 Days Post-Op Procedure(s) (LRB): XI ROBOT ASSISTED DIAGNOSTIC LAPAROSCOPY, ROBOTIC assisted OPEN MASS EXCISION, TOTAL ROBOTIC HYSTERECTOMY WITH BILATERAL SALPINGO OOPHORECTOMY, CYSTOSCOPY (N/A) XI ROBOTIC ASSISTED REMOVAL OF PELVIC MASS; INCIDENTAL LAPAROSCOPIC APPENDECTOMY (N/A)  Subjective: -N/V.  +flatus/small BM.  C/O mild abdominal pain  Objective: Vital signs in last 24 hours: Temp:  [98.1 F (36.7 C)-98.5 F (36.9 C)] 98.5 F (36.9 C) (07/27 0553) Pulse Rate:  [82-92] 92 (07/26 2120) Resp:  [18] 18 (07/27 0553) BP: (93-138)/(36-60) 138/60 (07/27 0553) SpO2:  [97 %-100 %] 99 % (07/27 0553) Last BM Date : 04/10/23  Intake/Output from previous day: 07/26 0701 - 07/27 0700 In: 600 [P.O.:600] Out: 630 [Urine:600; Drains:30]  Physical Examination: General: alert, cooperative, and no distress Resp: clear to auscultation bilaterally Cardio: regular rate and rhythm, S1, S2 normal, no murmur, click, rub or gallop GI: incision: abdominal lap site incisions intact with dermabond present--mild erythema surrounding incision,  abdomen soft, hypoactive bowel sounds, non distended  Extremities: extremities normal, atraumatic, no cyanosis or edema SCDs on   Labs: WBC/Hgb/Hct/Plts:  11.3/10.3/31.8/335 (07/27 0602) BUN/Cr/glu/ALT/AST/amyl/lip:  6/0.47/--/--/--/--/-- (07/27 0602)  Assessment: 47 y.o. s/p Procedure(s): XI ROBOT ASSISTED DIAGNOSTIC LAPAROSCOPY, ROBOTIC assisted OPEN MASS EXCISION, TOTAL ROBOTIC HYSTERECTOMY WITH BILATERAL SALPINGO OOPHORECTOMY, CYSTOSCOPY XI ROBOTIC ASSISTED REMOVAL OF PELVIC MASS; INCIDENTAL LAPAROSCOPIC APPENDECTOMY: stable Pain:  Pain is well-controlled on PRN medications.  Heme: Anemia stable  ID: WBC downward trending following periop steroid dosing  GI:  Bowel function returning. Tolerating clear liquid diet.   FEN: No significant electrolyte disturbances  Prophylaxis: SCDs and lovenox ordered.  Plan:  Diet as tolerated If pt passes  tolerates advancing diet, plan for discharge later today    LOS: 3 days    Antionette Char 04/12/2023, 9:04 AM

## 2023-04-12 NOTE — Plan of Care (Signed)
  Problem: Education: Goal: Knowledge of General Education information will improve Description: Including pain rating scale, medication(s)/side effects and non-pharmacologic comfort measures Outcome: Adequate for Discharge   Problem: Health Behavior/Discharge Planning: Goal: Ability to manage health-related needs will improve Outcome: Adequate for Discharge   Problem: Clinical Measurements: Goal: Ability to maintain clinical measurements within normal limits will improve Outcome: Adequate for Discharge Goal: Will remain free from infection Outcome: Adequate for Discharge Goal: Diagnostic test results will improve Outcome: Adequate for Discharge Goal: Respiratory complications will improve Outcome: Adequate for Discharge Goal: Cardiovascular complication will be avoided Outcome: Adequate for Discharge   Problem: Activity: Goal: Risk for activity intolerance will decrease Outcome: Adequate for Discharge   Problem: Nutrition: Goal: Adequate nutrition will be maintained Outcome: Adequate for Discharge   Problem: Coping: Goal: Level of anxiety will decrease Outcome: Adequate for Discharge   Problem: Elimination: Goal: Will not experience complications related to bowel motility Outcome: Adequate for Discharge Goal: Will not experience complications related to urinary retention Outcome: Adequate for Discharge   Problem: Pain Managment: Goal: General experience of comfort will improve Outcome: Adequate for Discharge   Problem: Safety: Goal: Ability to remain free from injury will improve Outcome: Adequate for Discharge   Problem: Skin Integrity: Goal: Risk for impaired skin integrity will decrease Outcome: Adequate for Discharge   Problem: Education: Goal: Knowledge of the prescribed therapeutic regimen will improve Outcome: Adequate for Discharge Goal: Understanding of sexual limitations or changes related to disease process or condition will improve Outcome: Adequate  for Discharge Goal: Individualized Educational Video(s) Outcome: Adequate for Discharge   Problem: Self-Concept: Goal: Communication of feelings regarding changes in body function or appearance will improve Outcome: Adequate for Discharge   Problem: Skin Integrity: Goal: Demonstration of wound healing without infection will improve Outcome: Adequate for Discharge   Problem: Education: Goal: Knowledge of General Education information will improve Description: Including pain rating scale, medication(s)/side effects and non-pharmacologic comfort measures Outcome: Adequate for Discharge   Problem: Health Behavior/Discharge Planning: Goal: Ability to manage health-related needs will improve Outcome: Adequate for Discharge   Problem: Clinical Measurements: Goal: Ability to maintain clinical measurements within normal limits will improve Outcome: Adequate for Discharge Goal: Will remain free from infection Outcome: Adequate for Discharge Goal: Diagnostic test results will improve Outcome: Adequate for Discharge Goal: Respiratory complications will improve Outcome: Adequate for Discharge Goal: Cardiovascular complication will be avoided Outcome: Adequate for Discharge   Problem: Activity: Goal: Risk for activity intolerance will decrease Outcome: Adequate for Discharge   Problem: Nutrition: Goal: Adequate nutrition will be maintained Outcome: Adequate for Discharge   Problem: Coping: Goal: Level of anxiety will decrease Outcome: Adequate for Discharge   Problem: Elimination: Goal: Will not experience complications related to bowel motility Outcome: Adequate for Discharge Goal: Will not experience complications related to urinary retention Outcome: Adequate for Discharge   Problem: Pain Managment: Goal: General experience of comfort will improve Outcome: Adequate for Discharge   Problem: Safety: Goal: Ability to remain free from injury will improve Outcome: Adequate  for Discharge   Problem: Skin Integrity: Goal: Risk for impaired skin integrity will decrease Outcome: Adequate for Discharge

## 2023-04-12 NOTE — Discharge Summary (Signed)
Physician Discharge Summary  Patient ID: Anne Horton MRN: [id number] DOB/AGE: [birth year] 49 y.o.  Admit date: 04/09/2023 Discharge date: 04/12/2023  Admission Diagnoses: Adnexal mass  Discharge Diagnoses:  Principal Problem:   Adnexal mass Active Problems:   Pelvic mass   Discharged Condition: good  Hospital Course: On 04/09/2023, the patient underwent the following: Procedure(s): XI ROBOT ASSISTED DIAGNOSTIC LAPAROSCOPY, ROBOTIC assisted OPEN MASS EXCISION, TOTAL ROBOTIC HYSTERECTOMY WITH BILATERAL SALPINGO OOPHORECTOMY, CYSTOSCOPY XI ROBOTIC ASSISTED REMOVAL OF PELVIC MASS; INCIDENTAL LAPAROSCOPIC APPENDECTOMY.   The postoperative course was uneventful.  She was discharged to home on postoperative day 3 tolerating a regular diet.   Consults: None  Significant Diagnostic Studies: None  Treatments: surgery: see above  Discharge Exam: Blood pressure (!) 105/51, pulse (!) 110, temperature 98.4 F (36.9 C), temperature source Oral, resp. rate 17, height 5\' 2"  (1.575 m), weight 61.1 kg, SpO2 100%. General appearance: alert GI: soft, non-tender; bowel sounds normal; no masses,  no organomegaly Extremities: extremities normal, atraumatic, no cyanosis or edema Incision/Wound: C/D/I  Disposition: Discharge disposition: 01-Home or Self Care       Discharge Instructions     Call MD for:  extreme fatigue   Complete by: As directed    Call MD for:  persistant dizziness or light-headedness   Complete by: As directed    Call MD for:  persistant nausea and vomiting   Complete by: As directed    Call MD for:  redness, tenderness, or signs of infection (pain, swelling, redness, odor or green/yellow discharge around incision site)   Complete by: As directed    Call MD for:  severe uncontrolled pain   Complete by: As directed    Call MD for:  temperature >100.4   Complete by: As directed    Diet - low sodium heart healthy   Complete by: As directed       Allergies as of  04/12/2023       Reactions   Iron Sucrose Hives, Rash, Other (See Comments)   Respiratory Distress (ALLERGY/intolerance) seizure like activity   Latex Rash        Medication List     STOP taking these medications    bisacodyl 5 MG EC tablet Commonly known as: bisacodyl   metroNIDAZOLE 500 MG tablet Commonly known as: FLAGYL   neomycin 500 MG tablet Commonly known as: MYCIFRADIN   polyethylene glycol powder 17 GM/SCOOP powder Commonly known as: GLYCOLAX/MIRALAX       TAKE these medications    acetaminophen 500 MG tablet Commonly known as: TYLENOL Take 1,000 mg by mouth every 6 (six) hours as needed for moderate pain.   ALPRAZolam 0.5 MG tablet Commonly known as: XANAX Take 1/2 to 1 tablet by mouth 2 times a day as needed   apixaban 2.5 MG Tabs tablet Commonly known as: Eliquis Take 1 tablet (2.5 mg) by mouth 2 times daily.   CALCIUM PO Take 2 tablets by mouth daily at 6 (six) AM. GUMMY   cetirizine 10 MG tablet Commonly known as: ZYRTEC Take 10 mg by mouth daily.   cyclobenzaprine 5 MG tablet Commonly known as: FLEXERIL Take 1 tablet (5 mg total) by mouth 3 (three) times daily as needed for muscle spasms. Do not take and drive   Hair Skin & Nails Tabs Take 2 tablets by mouth daily.   ibuprofen 200 MG tablet Commonly known as: ADVIL Take 800 mg by mouth every 6 (six) hours as needed for moderate pain.   imatinib 400  MG tablet Commonly known as: GLEEVEC Take 1 tablet (400 mg total) by mouth daily. Take with meals and large glass of water.Caution:Chemotherapy. What changed: when to take this   metoprolol succinate 50 MG 24 hr tablet Commonly known as: TOPROL-XL Take 1 tablet (50 mg total) by mouth daily.   multivitamin tablet Take 1 tablet by mouth daily.   ondansetron 8 MG tablet Commonly known as: ZOFRAN Take 1 tablet (8 mg) by mouth every 8 hours as needed for nausea or vomiting.   oxyCODONE 5 MG immediate release tablet Commonly known as:  Oxy IR/ROXICODONE Take 1 tablet (5 mg) by mouth every 4 hours as needed for severe pain. For AFTER surgery, do not take and drive   propranolol 10 MG tablet Commonly known as: INDERAL TAKE 1 TABLET BY MOUTH FOUR TIMES DAILY AS NEEDED FOR PALPITATIONS   sennosides-docusate sodium 8.6-50 MG tablet Commonly known as: SENOKOT-S Take 1-2 tablets by mouth daily as needed for constipation.   tamoxifen 20 MG tablet Commonly known as: NOLVADEX TAKE 1 TABLET BY MOUTH ONCE A DAY   traMADol 50 MG tablet Commonly known as: ULTRAM Take 1 tablet (50 mg total) by mouth every 6 (six) hours as needed.   venlafaxine XR 75 MG 24 hr capsule Commonly known as: EFFEXOR-XR Take 75 mg by mouth daily with breakfast.        Follow-up Information     Carver Fila, MD Follow up on 04/17/2023.   Specialty: Gynecologic Oncology Why: around 4:30 pm will be a PHONE visit with Dr. Pricilla Holm to check in and discuss path. If this comes back sooner, she will reach out sooner. IN PERSON visit will be on 05/15/23 at 2pm at the [facility]. Contact information: [address] [place] [address] [phone]                 Signed: Ilene Qua-, MD 04/12/2023, 1:18 PM

## 2023-04-12 NOTE — Progress Notes (Signed)
Assessment unchanged. Ate a good lunch and Dr Tamela Oddi made aware via phone. MD put discharge in EPIC. Pt verbalized understanding of dc instructions including  medications, incisional care and follow up care. Discharged via wc to front entrance accompanied by NT and husband.

## 2023-04-14 ENCOUNTER — Encounter: Payer: Self-pay | Admitting: Gynecologic Oncology

## 2023-04-14 ENCOUNTER — Other Ambulatory Visit: Payer: Self-pay | Admitting: Gynecologic Oncology

## 2023-04-14 ENCOUNTER — Other Ambulatory Visit: Payer: Self-pay

## 2023-04-14 ENCOUNTER — Telehealth: Payer: Self-pay | Admitting: Surgery

## 2023-04-14 NOTE — Telephone Encounter (Signed)
Could you reach out to med onc to ask that?

## 2023-04-14 NOTE — Telephone Encounter (Signed)
I think we probably need to get the glue off - could you please have her come in and take the glue off and put steri strips on the incisions today? Thank you

## 2023-04-14 NOTE — Telephone Encounter (Signed)
Per Dr Pricilla Holm, called patient back to let her know that she can continue to take glue off at home and use OTC steroid cream for itching. Patient verbalized understanding. Advised patient to call our office if she feels she needs help getting the rest of the glue off and we can schedule for her to come in. Also advised pt that per Dr Mosetta Putt, she should continue to hold her Gleevec until she sees Dr Mosetta Putt on 8/5. Patient verbalized understanding and had no further concerns at this time.

## 2023-04-14 NOTE — Telephone Encounter (Signed)
Spoke with Anne Horton this morning. She states she is eating, drinking and urinating well. She is having regular bowel movements that are a little hard but she denies having to strain. She is taking senokot as prescribed and encouraged her to drink plenty of water. She denies fever or chills. Incisions are read, warm, and swollen. She is having clear yellow drainage from her incisions and is using the non-telfa dressings. She states dressing are saturated after having them on all night. Denies any purulent drainage or bleeding from incisions. Patient sent 2 images of incisions while on post op call. She rates her pain 3/10. Her pain is controlled with Tramadol, Flexeril, and Tylenol.     Instructed to call office with any fever, chills, purulent drainage, uncontrolled pain or any other questions or concerns. Patient verbalizes understanding.   Pt aware of post op appointments as well as the office number [phone] and after hours number [phone] to call if she has any questions or concerns

## 2023-04-15 NOTE — Progress Notes (Signed)
I called the patient to check in after surgery. She is overall doing well. Has had good return of bowel function. Notes improvement of skin rash/blisters around incisions after removing dermabond and starting low-dose steroid cream. Discussed pathology which shows recurrent GIST, normal bilateral adnexa.  Eugene Garnet MD

## 2023-04-17 ENCOUNTER — Telehealth: Payer: Commercial Managed Care - PPO | Admitting: Gynecologic Oncology

## 2023-04-18 ENCOUNTER — Other Ambulatory Visit (HOSPITAL_COMMUNITY): Payer: Self-pay

## 2023-04-20 NOTE — Assessment & Plan Note (Signed)
Multifocal GIST of small bowel, pT4N0Mo, stage IIIB, high grade, mitotic rate 9/HPF, pertoneal metastasis in 03/2023, KIT, PDGFRA mutation (-) -Diagnosed in 02/2021 -resection on 02/26/21 under Dr. Donell Beers showed multiple tumor in small bowel, with three small lesions which were unable to be resected. Path confirmed GIST, high grade, margins and lymph nodes were negative. -she began imatinib 400 mg daily on 05/07/21. She has been tolerating well without issue. -repeated CT have not sure evidence of recurrence until 03/08/2023, and showed a large right ovarian mass. -She underwent exploratory laparoscope by Dr. Donell Beers and GYN Dr. Pricilla Holm on 04/09/2023, she underwent resection of her right ovarian mass (path confirmed GIST), BSO and hysterectomy.  Unfortunately she was found to have multiple peritoneal metastasis, biopsy confirmed metastatic GIST. -Due to the significant disease progression to peritoneum, I recommend changing her treatment to Sunitinib, I discussed the benefit and potential side effects, especially hypertension, thyroid dysfunction, thrombosis, hepatic toxicity, pancreatitis, GI perforation, hemorrhage, diarrhea, nausea, hand-and-foot syndrome, etc.  She agrees to proceed.  Will let her try 50 mg daily on day 1-28 of 42 days, if she does not tolerate well, will change to 37.5 mg daily

## 2023-04-21 ENCOUNTER — Other Ambulatory Visit: Payer: Self-pay

## 2023-04-21 ENCOUNTER — Ambulatory Visit: Payer: Commercial Managed Care - PPO | Admitting: Hematology

## 2023-04-21 ENCOUNTER — Encounter: Payer: Self-pay | Admitting: Hematology

## 2023-04-21 ENCOUNTER — Other Ambulatory Visit (HOSPITAL_COMMUNITY): Payer: Self-pay

## 2023-04-21 ENCOUNTER — Inpatient Hospital Stay: Payer: Commercial Managed Care - PPO | Attending: Hematology | Admitting: Hematology

## 2023-04-21 VITALS — BP 128/67 | HR 88 | Temp 98.2°F | Resp 15 | Ht 62.0 in | Wt 129.8 lb

## 2023-04-21 DIAGNOSIS — Z90710 Acquired absence of both cervix and uterus: Secondary | ICD-10-CM | POA: Diagnosis not present

## 2023-04-21 DIAGNOSIS — N839 Noninflammatory disorder of ovary, fallopian tube and broad ligament, unspecified: Secondary | ICD-10-CM | POA: Diagnosis not present

## 2023-04-21 DIAGNOSIS — Z7901 Long term (current) use of anticoagulants: Secondary | ICD-10-CM | POA: Insufficient documentation

## 2023-04-21 DIAGNOSIS — K5900 Constipation, unspecified: Secondary | ICD-10-CM | POA: Insufficient documentation

## 2023-04-21 DIAGNOSIS — C50412 Malignant neoplasm of upper-outer quadrant of left female breast: Secondary | ICD-10-CM | POA: Insufficient documentation

## 2023-04-21 DIAGNOSIS — Z170 Estrogen receptor positive status [ER+]: Secondary | ICD-10-CM | POA: Insufficient documentation

## 2023-04-21 DIAGNOSIS — R252 Cramp and spasm: Secondary | ICD-10-CM | POA: Insufficient documentation

## 2023-04-21 DIAGNOSIS — Z79899 Other long term (current) drug therapy: Secondary | ICD-10-CM | POA: Diagnosis not present

## 2023-04-21 DIAGNOSIS — Z9049 Acquired absence of other specified parts of digestive tract: Secondary | ICD-10-CM | POA: Diagnosis not present

## 2023-04-21 DIAGNOSIS — C786 Secondary malignant neoplasm of retroperitoneum and peritoneum: Secondary | ICD-10-CM | POA: Insufficient documentation

## 2023-04-21 DIAGNOSIS — D259 Leiomyoma of uterus, unspecified: Secondary | ICD-10-CM | POA: Diagnosis not present

## 2023-04-21 DIAGNOSIS — Z803 Family history of malignant neoplasm of breast: Secondary | ICD-10-CM | POA: Insufficient documentation

## 2023-04-21 DIAGNOSIS — Z833 Family history of diabetes mellitus: Secondary | ICD-10-CM | POA: Insufficient documentation

## 2023-04-21 DIAGNOSIS — I700 Atherosclerosis of aorta: Secondary | ICD-10-CM | POA: Diagnosis not present

## 2023-04-21 DIAGNOSIS — Z90722 Acquired absence of ovaries, bilateral: Secondary | ICD-10-CM | POA: Insufficient documentation

## 2023-04-21 DIAGNOSIS — R110 Nausea: Secondary | ICD-10-CM | POA: Diagnosis not present

## 2023-04-21 DIAGNOSIS — C49A3 Gastrointestinal stromal tumor of small intestine: Secondary | ICD-10-CM | POA: Diagnosis not present

## 2023-04-21 DIAGNOSIS — Q8500 Neurofibromatosis, unspecified: Secondary | ICD-10-CM | POA: Insufficient documentation

## 2023-04-21 DIAGNOSIS — Z8249 Family history of ischemic heart disease and other diseases of the circulatory system: Secondary | ICD-10-CM | POA: Diagnosis not present

## 2023-04-21 DIAGNOSIS — Z823 Family history of stroke: Secondary | ICD-10-CM | POA: Diagnosis not present

## 2023-04-21 DIAGNOSIS — Z87891 Personal history of nicotine dependence: Secondary | ICD-10-CM | POA: Insufficient documentation

## 2023-04-21 MED ORDER — SUNITINIB MALATE 50 MG PO CAPS
50.0000 mg | ORAL_CAPSULE | Freq: Every day | ORAL | 0 refills | Status: DC
Start: 2023-04-21 — End: 2023-06-03
  Filled 2023-04-21 – 2023-04-29 (×2): qty 28, 28d supply, fill #0

## 2023-04-21 NOTE — Progress Notes (Signed)
[facility]   [phone] Fax:([phone]) [phone]   Clinic Follow up Note   Patient Care Team: Eden Lathe as PCP - General (Family Medicine) [name], Deloris Ping, MD as PCP - Cardiology (Cardiology) Antony Blackbird, MD as Consulting Physician (Radiation Oncology) Serena Croissant, MD as Consulting Physician (Hematology and Oncology) Emelia Loron, MD as Consulting Physician (General Surgery) Malachy Mood, MD as Consulting Physician (Oncology)  Date of Service:  04/21/2023  CHIEF COMPLAINT: f/u of  GIST and h/o breast cancer   CURRENT THERAPY:   Sunitinib starting 05/05/2023   ASSESSMENT:  Anne Horton is a 47 y.o. female with   GIST (gastrointestinal stromal tumor) of small bowel, malignant (HCC) Multifocal GIST of small bowel, pT4N0Mo, stage IIIB, high grade, mitotic rate 9/HPF, pertoneal metastasis in 03/2023, KIT, PDGFRA mutation (-) -Diagnosed in 02/2021 -resection on 02/26/21 under Dr. Donell Beers showed multiple tumor in small bowel, with three small lesions which were unable to be resected. Path confirmed GIST, high grade, margins and lymph nodes were negative. -she began imatinib 400 mg daily on 05/07/21. She has been tolerating well without issue. -repeated CT have not sure evidence of recurrence until 03/08/2023, and showed a large right ovarian mass. -She underwent exploratory laparoscope by Dr. Donell Beers and GYN Dr. Pricilla Holm on 04/09/2023, she underwent resection of her right ovarian mass (path confirmed GIST), BSO and hysterectomy.  Unfortunately she was found to have multiple peritoneal metastasis, biopsy confirmed metastatic GIST. -Due to the significant disease progression to peritoneum, I recommend changing her treatment to Sunitinib, I discussed the benefit and potential side effects, especially hypertension, thyroid dysfunction, thrombosis, hepatic toxicity, pancreatitis, GI perforation, hemorrhage, diarrhea, nausea, hand-and-foot syndrome, etc.   She agrees to proceed.  Will let her try 50 mg daily on day 1-28 of 42 days, if she does not tolerate well, will change to 37.5 mg daily -We discussed her disease progression and prognosis today.  Unfortunately due to the peritoneal metastasis, this is not a curable disease, the median overall survival is probably around 2 years.  This is certainly devastating news to the patient, her husband who is with her today, and gave emotional support.  -she will take some time off to recover from surgery, and see how she does with Sutent.   PLAN: -I recommend change imatinib to sunitinib 50 mg daily and its side effects due to significant disease progression. -Start Sunitinib 8/19 -lab and f/u 8/29  SUMMARY OF ONCOLOGIC HISTORY: Oncology History Overview Note  Cancer Staging GIST (gastrointestinal stromal tumor) of small bowel, malignant (HCC) Staging form: Gastrointestinal Stromal Tumor - Small Intestinal, Esophageal, Colorectal, Mesenteric, and Peritoneal GIST, AJCC 8th Edition - Pathologic stage from 04/03/2021: Stage IIIB (pT4, pN0, cM0, Mitotic Rate: High) - Signed by Malachy Mood, MD on 04/23/2021  Malignant neoplasm of upper-outer quadrant of left breast in female, estrogen receptor positive (HCC) Staging form: Breast, AJCC 8th Edition - Clinical stage from 03/20/2018: Stage IA (cT1c, cN0, cM0, G2, ER+, PR+, HER2-) - Signed by Loa Socks, NP on 05/27/2018 - Pathologic stage from 05/07/2018: Stage IA (pT1c, pN0, cM0, G1, ER+, PR+, HER2-, Oncotype DX score: 11) - Signed by Loa Socks, NP on 05/27/2018    Malignant neoplasm of upper-outer quadrant of left breast in female, estrogen receptor positive (HCC)  03/20/2018 Initial Diagnosis   Screening detected left breast spiculated mass by ultrasound measured 1.2 cm.  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  This variant was reclassified from a VUS to pathogenic on June 20, 2021.  The Common Hereditary Cancer Panel offered by [facility] includes sequencing and/or deletion duplication testing of the following 47 genes: APC, ATM, AXIN2, BARD1, BMPR1A, BRCA1, BRCA2, BRIP1, CDH1, CDKN2A (p14ARF), CDKN2A (p16INK4a), CKD4, CHEK2, CTNNA1, DICER1, EPCAM (Deletion/duplication testing only), GREM1 (promoter region deletion/duplication testing only), KIT, MEN1, MLH1, MSH2, MSH3, MSH6, MUTYH, NBN, NF1, NHTL1, PALB2, PDGFRA, PMS2, POLD1, POLE, PTEN, RAD50, RAD51C, RAD51D, SDHB, SDHC, SDHD, SMAD4, SMARCA4. STK11, TP53, TSC1, TSC2, and VHL.  The following genes were evaluated for sequence changes only: SDHA and HOXB13 c.251G>A variant only.  Results: No pathogenic variants identified.  A Variant of uncertain significance in the gene NF1 was detected c.2325G>A (Silent). The date of this test report is 04/03/2018.    05/07/2018 Surgery   Left lumpectomy: IDC grade 1, 1.2 cm, intermediate grade DCIS, margins negative,PASH, 0/7 sentinel lymph nodes negative, ER 95%, PR 95%, Ki-67 1%, HER-2 negative ratio 1.47, T1CN0 stage Ia   05/07/2018 Cancer Staging   Staging form: Breast, AJCC 8th Edition - Pathologic stage from 05/07/2018: Stage IA (pT1c, pN0, cM0, G1, ER+, PR+, HER2-, Oncotype DX score: 11) - Signed by Loa Socks, NP on 05/27/2018   05/25/2018 Oncotype testing   Oncotype DX score 11: 3% risk of distant recurrence of 9 years, low risk   06/24/2018 - 08/03/2018 Radiation Therapy   Adjuvant radiation therapy   09/2018 -  Anti-estrogen oral therapy   Tamoxifen daily   GIST (gastrointestinal stromal tumor) of small bowel, malignant  (HCC)  04/03/2021 Cancer Staging   Staging form: Gastrointestinal Stromal Tumor - Small Intestinal, Esophageal, Colorectal, Mesenteric, and Peritoneal GIST, AJCC 8th Edition - Pathologic stage from 04/03/2021: Stage IIIB (pT4, pN0, cM0, Mitotic Rate: High) - Signed by Malachy Mood, MD on 04/23/2021 Stage prefix: Initial diagnosis Histologic grade (G): High grade Histologic grading system: 2 grade system Residual tumor (R): R0 - None   04/23/2021 Initial Diagnosis   GIST (gastrointestinal stromal tumor) of small bowel, malignant (HCC)   05/15/2021 Imaging   CT Chest w/o contrast  IMPRESSION: 1. 6 mm nodular opacity in the posterior right lower lobe with a similar size focus of architectural distortion/scarring in this region on the remote study from 17 years ago, suggesting that this is simply scar although the finding is more confluent on today's study. While almost assuredly benign, consider follow-up CT chest in 6 months to ensure stability. 2. No evidence for metastatic disease in the chest.   08/21/2021 Imaging   EXAM: CT ABDOMEN AND PELVIS WITH CONTRAST  IMPRESSION: Interval surgical resection of large left abdominal cystic mass since prior exam. No evidence of tumor recurrence or metastatic disease within the abdomen or pelvis.   8 mm low-attenuation lesion in pancreatic head, not definitely seen on prior exams. Consider further characterization with MRI, or continued surveillance on follow-up CT.   Small posterior uterine fibroid.   08/23/2021 Imaging   EXAM: MRI ABDOMEN WITHOUT AND WITH CONTRAST  IMPRESSION: 1. No evidence of pancreatic mass or suspicious correlate for the CT abnormality, which may have been artifactual or possibly related  to heterogeneous fatty replacement. 2. No acute process or evidence of metastatic disease in the abdomen. 3.  Aortic Atherosclerosis (ICD10-I70.0).      INTERVAL HISTORY:  Anne Horton is here for a follow up of GIST and h/o  breast cancer . She was last seen by me on 03/26/2023. She presents to the clinic accompanied by husband. Pt state that she had pain that was intermittent. Pt state that she has recovered well from surgery, just a little sore. Pt state she hasn't been having to take pain medication.    All other systems were reviewed with the patient and are negative.  MEDICAL HISTORY:  Past Medical History:  Diagnosis Date   Allergy    Anemia    Anginal pain (HCC)    Anxiety    Breast cancer (HCC) 05/07/2018   left invasive ductal    Cancer (HCC)    Chest pain    Depression    Family history of breast cancer    Fibromyalgia    neurofibromatosis   H/O: depression    Hx of migraines    Mitral regurgitation    mild per echo EF 55-60%   Neurofibromatosis (HCC)    Palpitations    Personal history of radiation therapy 08/10/2018   left breast 06/24/2018-08/10/2018  Dr Antony Blackbird   Tachycardia    Hx. of   Wears contact lenses     SURGICAL HISTORY: Past Surgical History:  Procedure Laterality Date   BOWEL RESECTION N/A 04/03/2021   Procedure: SMALL BOWEL RESECTION;  Surgeon: [name], MD;  Location: [facility];  Service: General;  Laterality: N/A;   BREAST BIOPSY     BREAST LUMPECTOMY Left 05/07/2018   Invasive ductal    BREAST LUMPECTOMY WITH RADIOACTIVE SEED AND SENTINEL LYMPH NODE BIOPSY Left 05/07/2018   Procedure: BREAST LUMPECTOMY WITH RADIOACTIVE SEED AND SENTINEL LYMPH NODE BIOPSY;  Surgeon: Emelia Loron, MD;  Location: [facility];  Service: General;  Laterality: Left;   CESAREAN SECTION     CHOLECYSTECTOMY N/A 01/04/2015   Procedure: LAPAROSCOPIC CHOLECYSTECTOMY WITH INTRAOPERATIVE CHOLANGIOGRAM;  Surgeon: Harriette Bouillon, MD;  Location: [facility];  Service: General;  Laterality: N/A;   COLONOSCOPY     ESOPHAGOGASTRODUODENOSCOPY     LAPAROSCOPY N/A 04/09/2023   Procedure: XI ROBOT ASSISTED DIAGNOSTIC LAPAROSCOPY, ROBOTIC assisted OPEN MASS EXCISION,  TOTAL ROBOTIC HYSTERECTOMY WITH BILATERAL SALPINGO OOPHORECTOMY, CYSTOSCOPY;  Surgeon: Carver Fila, MD;  Location: [facility];  Service: Gynecology;  Laterality: N/A;   LAPAROTOMY N/A 04/03/2021   Procedure: EXPLORATORY LAPAROTOMY WITH RESECTION OF ABDOMINAL MASS;  Surgeon: [name], MD;  Location: [facility];  Service: General;  Laterality: N/A;   MASTOPEXY Bilateral 09/05/2020   Procedure: BILATERAL BREAST MASTOPEXY;  Surgeon: Glenna Fellows, MD;  Location: [facility];  Service: Plastics;  Laterality: Bilateral;   OMENTECTOMY N/A 04/03/2021   Procedure: OMENTECTOMY;  Surgeon: [name], MD;  Location: [facility];  Service: General;  Laterality: N/A;   ROBOTIC ASSISTED LAPAROSCOPIC LYSIS OF ADHESION N/A 04/09/2023   Procedure: XI ROBOTIC ASSISTED REMOVAL OF PELVIC MASS; INCIDENTAL LAPAROSCOPIC APPENDECTOMY;  Surgeon: [name], MD;  Location: [facility];  Service: General;  Laterality: N/A;   WISDOM TOOTH EXTRACTION      I have reviewed the social history and family history with the patient and they are unchanged from previous note.  ALLERGIES:  is allergic to iron sucrose and latex.  MEDICATIONS:  Current Outpatient Medications  Medication Sig Dispense Refill  acetaminophen (TYLENOL) 500 MG tablet Take 1,000 mg by mouth every 6 (six) hours as needed for moderate pain.     ALPRAZolam (XANAX) 0.5 MG tablet Take 1/2 to 1 tablet by mouth 2 times a day as needed 180 tablet 1   apixaban (ELIQUIS) 2.5 MG TABS tablet Take 1 tablet (2.5 mg) by mouth 2 times daily. 52 tablet 0   CALCIUM PO Take 2 tablets by mouth daily at 6 (six) AM. GUMMY     cetirizine (ZYRTEC) 10 MG tablet Take 10 mg by mouth daily.     cyclobenzaprine (FLEXERIL) 5 MG tablet Take 1 tablet (5 mg total) by mouth 3 (three) times daily as needed for muscle spasms. Do not take and drive 30 tablet 0   ibuprofen (ADVIL) 200 MG tablet Take 800 mg by mouth every 6 (six) hours as needed for moderate pain.     imatinib  (GLEEVEC) 400 MG tablet Take 1 tablet (400 mg total) by mouth daily. Take with meals and large glass of water.Caution:Chemotherapy. (Patient taking differently: Take 400 mg by mouth at bedtime. Take with meals and large glass of water.Caution:Chemotherapy.) 30 tablet 3   metoprolol succinate (TOPROL-XL) 50 MG 24 hr tablet Take 1 tablet (50 mg total) by mouth daily. 90 tablet 3   Multiple Vitamin (MULTIVITAMIN) tablet Take 1 tablet by mouth daily.     Multiple Vitamins-Minerals (HAIR SKIN & NAILS) TABS Take 2 tablets by mouth daily.     ondansetron (ZOFRAN) 8 MG tablet Take 1 tablet (8 mg) by mouth every 8 hours as needed for nausea or vomiting. 20 tablet 1   oxyCODONE (OXY IR/ROXICODONE) 5 MG immediate release tablet Take 1 tablet (5 mg) by mouth every 4 hours as needed for severe pain. For AFTER surgery, do not take and drive 20 tablet 0   propranolol (INDERAL) 10 MG tablet TAKE 1 TABLET BY MOUTH FOUR TIMES DAILY AS NEEDED FOR PALPITATIONS 120 tablet 3   sennosides-docusate sodium (SENOKOT-S) 8.6-50 MG tablet Take 1-2 tablets by mouth daily as needed for constipation.     tamoxifen (NOLVADEX) 20 MG tablet TAKE 1 TABLET BY MOUTH ONCE A DAY 90 tablet 3   traMADol (ULTRAM) 50 MG tablet Take 1 tablet (50 mg total) by mouth every 6 (six) hours as needed. 30 tablet 0   venlafaxine XR (EFFEXOR-XR) 75 MG 24 hr capsule Take 75 mg by mouth daily with breakfast.     No current facility-administered medications for this visit.    PHYSICAL EXAMINATION: ECOG PERFORMANCE STATUS: 1 - Symptomatic but completely ambulatory  Vitals:   04/21/23 1206  BP: 128/67  Pulse: 88  Resp: 15  Temp: 98.2 F (36.8 C)  SpO2: 100%   Wt Readings from Last 3 Encounters:  04/21/23 129 lb 12.8 oz (58.9 kg)  04/09/23 134 lb 11.2 oz (61.1 kg)  04/02/23 134 lb 11.2 oz (61.1 kg)     GENERAL:alert, no distress and comfortable SKIN: skin color normal, no rashes or significant lesions EYES: normal, Conjunctiva are pink and  non-injected, sclera clear  NEURO: alert & oriented x 3 with fluent speech  LABORATORY DATA:  I have reviewed the data as listed    Latest Ref Rng & Units 04/12/2023    6:02 AM 04/11/2023    4:22 AM 04/10/2023    4:18 AM  CBC  WBC 4.0 - 10.5 K/uL 11.3  13.4  19.0   Hemoglobin 12.0 - 15.0 g/dL 62.9  9.6  8.8   Hematocrit 36.0 -  46.0 % 31.8  29.6  27.5   Platelets 150 - 400 K/uL 335  287  258         Latest Ref Rng & Units 04/12/2023    6:02 AM 04/11/2023    4:22 AM 04/10/2023    4:18 AM  CMP  Glucose 70 - 99 mg/dL 109  604  540   BUN 6 - 20 mg/dL 6  <5  <5   Creatinine 0.44 - 1.00 mg/dL 9.81  1.91  4.78   Sodium 135 - 145 mmol/L 139  139  139   Potassium 3.5 - 5.1 mmol/L 3.5  3.6  3.9   Chloride 98 - 111 mmol/L 106  107  109   CO2 22 - 32 mmol/L 23  23  20    Calcium 8.9 - 10.3 mg/dL 8.3  8.0  7.7       RADIOGRAPHIC STUDIES: I have personally reviewed the radiological images as listed and agreed with the findings in the report. No results found.    No orders of the defined types were placed in this encounter.  All questions were answered. The patient knows to call the clinic with any problems, questions or concerns. No barriers to learning was detected. The total time spent in the appointment was 40 minutes.     Malachy Mood, MD 04/21/2023   Carolin Coy, CMA, am acting as scribe for Malachy Mood, MD.   I have reviewed the above documentation for accuracy and completeness, and I agree with the above.

## 2023-04-22 ENCOUNTER — Telehealth: Payer: Self-pay

## 2023-04-22 ENCOUNTER — Telehealth: Payer: Self-pay | Admitting: Pharmacist

## 2023-04-22 ENCOUNTER — Other Ambulatory Visit: Payer: Self-pay

## 2023-04-22 ENCOUNTER — Encounter: Payer: Self-pay | Admitting: Hematology and Oncology

## 2023-04-22 ENCOUNTER — Other Ambulatory Visit (HOSPITAL_COMMUNITY): Payer: Self-pay

## 2023-04-22 NOTE — Telephone Encounter (Addendum)
Oral Oncology Pharmacist Encounter  Received new prescription for Sutent (sunitinib) for the treatment of metastatic GIST, planned duration until disease progression or unacceptable drug toxicity. Planned start date per MD is 05/05/23.  BMP and CBC from 04/12/23 and CMP from 04/02/23 assessed, no relevant lab abnormalities requiring baseline dose adjustment required at this time. BP from 04/21/23 stable at 128/67 mmHg. Prescription dose and frequency assessed for appropriateness.  Current medication list in Epic reviewed, DDIs with Sutent identified: Category C drug-drug interaction between Sutent and Zofran due to risk of Qtc prolongation. Patient only taking ondansetron PRN, no change in therapy warranted at this time.   Evaluated chart and no patient barriers to medication adherence noted.   Patient agreement for treatment documented in MD note on 04/21/23.  Prescription has been e-scribed to the [facility] for benefits analysis and approval.  Oral Oncology Clinic will continue to follow for insurance authorization, copayment issues, initial counseling and start date.  Lenord Carbo, PharmD, BCPS, [facility] Hematology/Oncology Clinical Pharmacist [facility] Oral Chemotherapy Navigation Clinics [phone] 04/22/2023 8:46 AM

## 2023-04-22 NOTE — Telephone Encounter (Signed)
Oral Oncology Patient Advocate Encounter   Was successful in obtaining a copay card for Sunitinib (Teva [employer]).  This copay card will make the patients copay ~$0.00.  The billing information is as follows and has been shared with [facility].   RxBin: [id number] PCN:  Member ID: [id number] Group ID: [id number]   Ardeen Fillers, CPhT Oncology Pharmacy Patient Advocate  [facility]  [phone] (phone) [phone] (fax) 04/22/2023 2:36 PM

## 2023-04-22 NOTE — Telephone Encounter (Signed)
Oral Oncology Patient Advocate Encounter  New authorization   Received notification that prior authorization for Sunitinib is required.   PA submitted on 04/22/23  Key [id number]  Status is pending     Ardeen Fillers, CPhT Oncology Pharmacy Patient Advocate  [facility]  [phone] (phone) [phone] (fax) 04/22/2023 8:20 AM

## 2023-04-22 NOTE — Telephone Encounter (Addendum)
Oral Oncology Patient Advocate Encounter  Prior Authorization for Sunitinib has been approved.    PA# [id number]  Effective dates: 04/22/23 through 04/20/24  Patients co-pay is $250.00.   Obtained co-pay card for Teva Sunitinib ([id number]) to make co-pay $0.00.   Ardeen Fillers, CPhT Oncology Pharmacy Patient Advocate  [facility]  [phone] (phone) [phone] (fax) 04/22/2023 11:00 AM

## 2023-04-23 ENCOUNTER — Other Ambulatory Visit: Payer: Self-pay

## 2023-04-23 ENCOUNTER — Other Ambulatory Visit (HOSPITAL_COMMUNITY): Payer: Self-pay

## 2023-04-23 DIAGNOSIS — C49A3 Gastrointestinal stromal tumor of small intestine: Secondary | ICD-10-CM

## 2023-04-23 DIAGNOSIS — Z170 Estrogen receptor positive status [ER+]: Secondary | ICD-10-CM

## 2023-04-23 NOTE — Progress Notes (Signed)
[facility] ordered on Case# [id number] taken on 04/09/2023.  Lab for [facility] will be drawn on 05/15/2023.  Order placed and blood draw kit given to [facility].

## 2023-04-24 ENCOUNTER — Encounter: Payer: Self-pay | Admitting: Hematology

## 2023-04-26 ENCOUNTER — Other Ambulatory Visit (HOSPITAL_COMMUNITY): Payer: Self-pay

## 2023-04-28 ENCOUNTER — Other Ambulatory Visit (HOSPITAL_COMMUNITY): Payer: Self-pay

## 2023-04-28 MED ORDER — VENLAFAXINE HCL ER 75 MG PO CP24
75.0000 mg | ORAL_CAPSULE | Freq: Every day | ORAL | 1 refills | Status: DC
  Filled 2023-04-28: qty 90, 90d supply, fill #0
  Filled 2023-07-24: qty 90, 90d supply, fill #1

## 2023-04-29 ENCOUNTER — Ambulatory Visit: Payer: Commercial Managed Care - PPO

## 2023-04-29 ENCOUNTER — Other Ambulatory Visit: Payer: Commercial Managed Care - PPO

## 2023-04-29 ENCOUNTER — Other Ambulatory Visit: Payer: Self-pay

## 2023-04-29 ENCOUNTER — Other Ambulatory Visit (HOSPITAL_COMMUNITY): Payer: Self-pay

## 2023-04-29 ENCOUNTER — Telehealth: Payer: Self-pay

## 2023-04-29 NOTE — Telephone Encounter (Signed)
Oral Chemotherapy Pharmacist Encounter  I spoke with patient for overview of: Sutent (sunitinib) for the treatment of metastatic GIST, planned duration until disease progression or unacceptable toxicity.   Counseled patient on administration, dosing, side effects, monitoring, drug-food interactions, safe handling, storage, and disposal.  Patient will take Sutent 50mg  capsules, 1 capsule by mouth once daily with or without food.  Sutent will be administered 4 weeks on, 2 weeks off, repeat every 6 weeks.  Patient knows to aviod grapefruit and grapefruit juice while on Sutent.  Sutent start date: 05/05/23  Adverse effects include but are not limited to: fatigue, diarrhea, nausea, vomiting, taste changes, rash, hand-foot syndrome, bleeding events. Diarrhea: Patient will obtain Imodium (loperamide) to have on hand if they experience diarrhea. Patient knows to alert the office of 4 or more loose stools above baseline. Nausea/Vomiting: Patient has anti-emetic on hand and knows to take it if nausea develops. Bleeding events: Patient informed Sutent should be temporarily interrupted for major surgical procedures and can be restarted upon recovery from surgery.     Reviewed with patient importance of keeping a medication schedule and plan for any missed doses. No barriers to medication adherence identified.  Medication reconciliation performed and medication/allergy list updated.  All questions answered.  Anne Horton voiced understanding and appreciation.   Medication education handout placed in mail for patient. Patient knows to call the office with questions or concerns. Oral Chemotherapy Clinic phone number provided to patient.   Lenord Carbo, PharmD, BCPS, BCOP Hematology/Oncology Clinical Pharmacist [facility] Oral Chemotherapy Navigation Clinics [phone] 04/29/2023 10:18 AM

## 2023-04-29 NOTE — Telephone Encounter (Signed)
Faxed over a referral to Dr. Lin Givens at [facility]. Faxed the last office note, med list, demographics to the office. As per the patient and Dr. Mosetta Putt. Received fax confirmation of receipt.

## 2023-04-30 ENCOUNTER — Other Ambulatory Visit: Payer: Self-pay

## 2023-04-30 ENCOUNTER — Other Ambulatory Visit (HOSPITAL_COMMUNITY): Payer: Self-pay

## 2023-04-30 ENCOUNTER — Encounter: Payer: Self-pay | Admitting: Hematology and Oncology

## 2023-05-03 ENCOUNTER — Other Ambulatory Visit (HOSPITAL_COMMUNITY): Payer: Self-pay

## 2023-05-05 ENCOUNTER — Telehealth: Payer: Self-pay

## 2023-05-05 ENCOUNTER — Other Ambulatory Visit (HOSPITAL_COMMUNITY): Payer: Self-pay

## 2023-05-05 NOTE — Telephone Encounter (Signed)
Pt LVM stating she is scheduled for a routine dental cleaning and asking if she should continue taking Sutent (Sunitinib) prior to and post dental cleaning.  Spoke with pt via telephone to confirm that the dental appt is just for a routine dental cleaning and not a deep dental cleaning.  Pt confirmed that the cleaning is for a routine dental cleaning.  Informed pt that this nurse will check with Dr. Mosetta Putt to confirm it's OK for the pt to get a routine dental cleaning and whether the pt will need to stop the Sunitinib prior to or post dental cleaning.  Pt verbalized understanding and stated she will await my return call or MyChart message response.  Pt also stated she will see Dr. Lin Givens on 05/13/2023.

## 2023-05-06 ENCOUNTER — Encounter (HOSPITAL_COMMUNITY): Payer: Self-pay | Admitting: Hematology

## 2023-05-06 ENCOUNTER — Ambulatory Visit
Admission: RE | Admit: 2023-05-06 | Discharge: 2023-05-06 | Disposition: A | Discharge status: Home / Self Care | Payer: Commercial Managed Care - PPO | Source: Ambulatory Visit | Attending: Obstetrics and Gynecology | Admitting: Obstetrics and Gynecology

## 2023-05-06 ENCOUNTER — Other Ambulatory Visit: Payer: Self-pay

## 2023-05-06 DIAGNOSIS — N6324 Unspecified lump in the left breast, lower inner quadrant: Secondary | ICD-10-CM

## 2023-05-06 DIAGNOSIS — Z9889 Other specified postprocedural states: Secondary | ICD-10-CM | POA: Diagnosis not present

## 2023-05-06 DIAGNOSIS — N6459 Other signs and symptoms in breast: Secondary | ICD-10-CM | POA: Diagnosis not present

## 2023-05-06 NOTE — Progress Notes (Signed)
Faxed Dr. Clover Mealy w/[facility] [phone]  [phone]) pt's recent [facility] Report on surgical tissue sample.  Liquid biopsy test is scheduled to be done on 05/15/2023 for [facility].  Fax confirmation received.

## 2023-05-13 DIAGNOSIS — C49A3 Gastrointestinal stromal tumor of small intestine: Secondary | ICD-10-CM | POA: Diagnosis not present

## 2023-05-13 DIAGNOSIS — Q8500 Neurofibromatosis, unspecified: Secondary | ICD-10-CM | POA: Diagnosis not present

## 2023-05-13 DIAGNOSIS — Z170 Estrogen receptor positive status [ER+]: Secondary | ICD-10-CM | POA: Diagnosis not present

## 2023-05-13 DIAGNOSIS — Z5111 Encounter for antineoplastic chemotherapy: Secondary | ICD-10-CM | POA: Diagnosis not present

## 2023-05-13 DIAGNOSIS — C50412 Malignant neoplasm of upper-outer quadrant of left female breast: Secondary | ICD-10-CM | POA: Diagnosis not present

## 2023-05-14 NOTE — Progress Notes (Unsigned)
Gynecologic Oncology Return Clinic Visit  05/15/23  Reason for Visit: follow-up  Treatment History: Oncology History Overview Note  Cancer Staging GIST (gastrointestinal stromal tumor) of small bowel, malignant (HCC) Staging form: Gastrointestinal Stromal Tumor - Small Intestinal, Esophageal, Colorectal, Mesenteric, and Peritoneal GIST, AJCC 8th Edition - Pathologic stage from 04/03/2021: Stage IIIB (pT4, pN0, cM0, Mitotic Rate: High) - Signed by Malachy Mood, MD on 04/23/2021  Malignant neoplasm of upper-outer quadrant of left breast in female, estrogen receptor positive (HCC) Staging form: Breast, AJCC 8th Edition - Clinical stage from 03/20/2018: Stage IA (cT1c, cN0, cM0, G2, ER+, PR+, HER2-) - Signed by Loa Socks, NP on 05/27/2018 - Pathologic stage from 05/07/2018: Stage IA (pT1c, pN0, cM0, G1, ER+, PR+, HER2-, Oncotype DX score: 11) - Signed by Loa Socks, NP on 05/27/2018    Malignant neoplasm of upper-outer quadrant of left breast in female, estrogen receptor positive (HCC)  03/20/2018 Initial Diagnosis   Screening detected left breast spiculated mass by ultrasound measured 1.2 cm.  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  This variant was reclassified from a VUS to pathogenic on June 20, 2021.  The Common Hereditary Cancer Panel offered by [facility] includes sequencing and/or deletion duplication testing of the following 47 genes: APC, ATM, AXIN2, BARD1, BMPR1A, BRCA1, BRCA2, BRIP1, CDH1, CDKN2A (p14ARF), CDKN2A (p16INK4a), CKD4, CHEK2, CTNNA1, DICER1, EPCAM  (Deletion/duplication testing only), GREM1 (promoter region deletion/duplication testing only), KIT, MEN1, MLH1, MSH2, MSH3, MSH6, MUTYH, NBN, NF1, NHTL1, PALB2, PDGFRA, PMS2, POLD1, POLE, PTEN, RAD50, RAD51C, RAD51D, SDHB, SDHC, SDHD, SMAD4, SMARCA4. STK11, TP53, TSC1, TSC2, and VHL.  The following genes were evaluated for sequence changes only: SDHA and HOXB13 c.251G>A variant only.  Results: No pathogenic variants identified.  A Variant of uncertain significance in the gene NF1 was detected c.2325G>A (Silent). The date of this test report is 04/03/2018.    05/07/2018 Surgery   Left lumpectomy: IDC grade 1, 1.2 cm, intermediate grade DCIS, margins negative,PASH, 0/7 sentinel lymph nodes negative, ER 95%, PR 95%, Ki-67 1%, HER-2 negative ratio 1.47, T1CN0 stage Ia   05/07/2018 Cancer Staging   Staging form: Breast, AJCC 8th Edition - Pathologic stage from 05/07/2018: Stage IA (pT1c, pN0, cM0, G1, ER+, PR+, HER2-, Oncotype DX score: 11) - Signed by Loa Socks, NP on 05/27/2018   05/25/2018 Oncotype testing   Oncotype DX score 11: 3% risk of distant recurrence of 9 years, low risk   06/24/2018 - 08/03/2018 Radiation Therapy   Adjuvant radiation therapy   09/2018 -  Anti-estrogen oral therapy   Tamoxifen daily   GIST (gastrointestinal stromal tumor) of small bowel, malignant (HCC)  04/03/2021 Cancer Staging   Staging form: Gastrointestinal Stromal Tumor - Small Intestinal, Esophageal, Colorectal, Mesenteric, and Peritoneal GIST, AJCC 8th Edition - Pathologic stage from 04/03/2021: Stage IIIB (pT4, pN0, cM0, Mitotic Rate: High) - Signed by Malachy Mood, MD on 04/23/2021 Stage prefix: Initial diagnosis Histologic grade (G): High grade Histologic grading system: 2 grade system Residual tumor (R): R0 - None   04/23/2021 Initial Diagnosis   GIST (gastrointestinal stromal tumor) of small bowel,  malignant (HCC)   05/15/2021 Imaging   CT Chest w/o contrast  IMPRESSION: 1. 6 mm nodular opacity in  the posterior right lower lobe with a similar size focus of architectural distortion/scarring in this region on the remote study from 17 years ago, suggesting that this is simply scar although the finding is more confluent on today's study. While almost assuredly benign, consider follow-up CT chest in 6 months to ensure stability. 2. No evidence for metastatic disease in the chest.   08/21/2021 Imaging   EXAM: CT ABDOMEN AND PELVIS WITH CONTRAST  IMPRESSION: Interval surgical resection of large left abdominal cystic mass since prior exam. No evidence of tumor recurrence or metastatic disease within the abdomen or pelvis.   8 mm low-attenuation lesion in pancreatic head, not definitely seen on prior exams. Consider further characterization with MRI, or continued surveillance on follow-up CT.   Small posterior uterine fibroid.   08/23/2021 Imaging   EXAM: MRI ABDOMEN WITHOUT AND WITH CONTRAST  IMPRESSION: 1. No evidence of pancreatic mass or suspicious correlate for the CT abnormality, which may have been artifactual or possibly related to heterogeneous fatty replacement. 2. No acute process or evidence of metastatic disease in the abdomen. 3.  Aortic Atherosclerosis (ICD10-I70.0).   04/09/2023 Pathology Results     FINAL MICROSCOPIC DIAGNOSIS:  A. FALLOPIAN TUBE, RIGHT, AND ADJACENT MASS, RESECTION: Fragmented recurrent gastrointestinal stromal tumor (GIST), aggregate tumor size 10.7 cm Ovarian tissue with serosal involvement by GIST - Background uninvolved ovary with normal physiologic changes Benign fallopian tube See comment  B. OVARY AND FALLOPIAN TUBE, LEFT, SALPINGO OOPHORECTOMY: Benign ovary with normal physiologic changes and follicular cyst, 3.0 cm No evidence of tumor involvement Benign fallopian tube  C. LEFT SIDED TUMOR CAPSULE, BIOPSY: Positive for gastrointestinal stromal tumor (GIST)  D. LEFT PELVIS PERITONEAL NODULE, BIOPSY: Positive for  gastrointestinal stromal tumor (GIST)  E. RIGHT PELVIC SIDE WALL, BIOPSY: Positive for gastrointestinal stromal tumor (GIST)  F. APPENDIX, INCIDENTAL, APPENDECTOMY: Benign appendix with no significant pathologic change No evidence of tumor involvement  G. UTERUS AND CERVIX, HYSTERECTOMY (121 GRAMS): Inactive pattern endometrium - No evidence of hyperplasia, atypia or malignancy Adenomyosis Leiomyomas Uterine serosal adhesions Cervix with no significant pathologic change No evidence of tumor involvement     Miscellaneous   TEMPUS  GEN OMIC VARIA N TS  Potentially Actionable / Biologically Relevant No reportable pathogenic variants were found.  Tumor / Normal Matched Analysis (Potential Germline) No normal sample was received, therefore tumor/normal matched analysis was not performed.  Pertinent Negatives  No pathogenic single nucleotide variants, indels, or copy number changes found in: IMMUN OTHERAPY MARKERS  Tumor Mutational Burden  1.55m/mb   12th percentile  Microsatellite Instability Status -Stable     Interval History: Patient reports overall doing well.  Continues to have some intermittent abdominal soreness especially if anything is touching her belly.  Denies any vaginal bleeding.  Has noticed small amount of vaginal discharge.  Continues to struggle with constipation, at baseline.  Uses laxatives as needed.  Denies any urinary symptoms.  Met with specialist at [facility] and has a meeting at [facility] coming up with regard to her recurrent GIST.  Past Medical/Surgical History: Past Medical History:  Diagnosis Date   Allergy    Anemia    Anginal pain (HCC)    Anxiety    Breast cancer (HCC) 05/07/2018   left invasive ductal    Cancer (HCC)    Chest pain    Depression    Family history  of breast cancer    Fibromyalgia    neurofibromatosis   H/O: depression    Hx of migraines    Mitral regurgitation    mild per echo EF 55-60%   Neurofibromatosis (HCC)     Palpitations    Personal history of radiation therapy 08/10/2018   left breast 06/24/2018-08/10/2018  Dr Antony Blackbird   Tachycardia    Hx. of   Wears contact lenses     Past Surgical History:  Procedure Laterality Date   BOWEL RESECTION N/A 04/03/2021   Procedure: SMALL BOWEL RESECTION;  Surgeon: [name], MD;  Location: [facility];  Service: General;  Laterality: N/A;   BREAST BIOPSY     BREAST LUMPECTOMY Left 05/07/2018   Invasive ductal    BREAST LUMPECTOMY WITH RADIOACTIVE SEED AND SENTINEL LYMPH NODE BIOPSY Left 05/07/2018   Procedure: BREAST LUMPECTOMY WITH RADIOACTIVE SEED AND SENTINEL LYMPH NODE BIOPSY;  Surgeon: Emelia Loron, MD;  Location: [facility];  Service: General;  Laterality: Left;   CESAREAN SECTION     CHOLECYSTECTOMY N/A 01/04/2015   Procedure: LAPAROSCOPIC CHOLECYSTECTOMY WITH INTRAOPERATIVE CHOLANGIOGRAM;  Surgeon: Harriette Bouillon, MD;  Location: [facility];  Service: General;  Laterality: N/A;   COLONOSCOPY     ESOPHAGOGASTRODUODENOSCOPY     LAPAROSCOPY N/A 04/09/2023   Procedure: XI ROBOT ASSISTED DIAGNOSTIC LAPAROSCOPY, ROBOTIC assisted OPEN MASS EXCISION, TOTAL ROBOTIC HYSTERECTOMY WITH BILATERAL SALPINGO OOPHORECTOMY, CYSTOSCOPY;  Surgeon: Carver Fila, MD;  Location: [facility];  Service: Gynecology;  Laterality: N/A;   LAPAROTOMY N/A 04/03/2021   Procedure: EXPLORATORY LAPAROTOMY WITH RESECTION OF ABDOMINAL MASS;  Surgeon: [name], MD;  Location: [facility];  Service: General;  Laterality: N/A;   MASTOPEXY Bilateral 09/05/2020   Procedure: BILATERAL BREAST MASTOPEXY;  Surgeon: Glenna Fellows, MD;  Location: [facility];  Service: Plastics;  Laterality: Bilateral;   OMENTECTOMY N/A 04/03/2021   Procedure: OMENTECTOMY;  Surgeon: [name], MD;  Location: [facility];  Service: General;  Laterality: N/A;   ROBOTIC ASSISTED LAPAROSCOPIC LYSIS OF ADHESION N/A 04/09/2023   Procedure: XI ROBOTIC ASSISTED REMOVAL OF  PELVIC MASS; INCIDENTAL LAPAROSCOPIC APPENDECTOMY;  Surgeon: [name], MD;  Location: [facility];  Service: General;  Laterality: N/A;   WISDOM TOOTH EXTRACTION      Family History  Problem Relation Age of Onset   Breast cancer Mother 59       estrogen negative, recurred in 2012   Diabetes Mother    Neurofibromatosis Father    Atrial fibrillation Father    Heart disease Paternal Uncle    Diabetes Paternal Uncle    Atrial fibrillation Maternal Grandfather    Cancer Paternal Grandmother        type unk, died at 4   Neurofibromatosis Paternal Grandmother    Stroke Paternal Grandfather    Heart attack Neg Hx    Hypertension Neg Hx    Colon cancer Neg Hx    Colon polyps Neg Hx    Esophageal cancer Neg Hx    Rectal cancer Neg Hx    Stomach cancer Neg Hx     Social History   Socioeconomic History   Marital status: Married    Spouse name: Not on file   Number of children: 1   Years of education: Not on file   Highest education level: Not on file  Occupational History   Occupation: [employer]  Tobacco Use   Smoking status: Never   Smokeless tobacco: Never  Vaping Use  Vaping status: Never Used  Substance and Sexual Activity   Alcohol use: Not Currently    Alcohol/week: 0.0 - 1.0 standard drinks of alcohol    Comment: occasional but rarely   Drug use: No   Sexual activity: Yes    Birth control/protection: None  Other Topics Concern   Not on file  Social History Narrative   Pt lives with husband and daughter   Social Determinants of Health   Financial Resource Strain: Low Risk  (11/17/2020)   Received from [facility] visits prior to 11/16/2022., [facility] visits prior to 11/16/2022.   Overall Financial Resource Strain (CARDIA)    Difficulty of Paying Living Expenses: Not hard at all  Food Insecurity: Patient Declined (04/09/2023)   Hunger Vital Sign    Worried About Running Out of Food in the Last Year: Patient declined    Ran  Out of Food in the Last Year: Patient declined  Transportation Needs: Patient Declined (04/09/2023)   PRAPARE - Administrator, Civil Service (Medical): Patient declined    Lack of Transportation (Non-Medical): Patient declined  Physical Activity: Inactive (11/17/2020)   Received from [facility] visits prior to 11/16/2022., [facility] visits prior to 11/16/2022.   Exercise Vital Sign    Days of Exercise per Week: 0 days    Minutes of Exercise per Session: 0 min  Stress: Stress Concern Present (11/17/2020)   [facility] from [facility] visits prior to 11/16/2022., [facility] visits prior to 11/16/2022.   [employer] of Occupational Health - Occupational Stress Questionnaire    Feeling of Stress : To some extent  Social Connections: Moderately Isolated (11/17/2020)   Received from [facility] visits prior to 11/16/2022., [facility] visits prior to 11/16/2022.   Social Advertising account executive [NHANES]    Frequency of Communication with Friends and Family: More than three times a week    Frequency of Social Gatherings with Friends and Family: Patient refused    Attends Religious Services: Never    Database administrator or Organizations: No    Attends Engineer, structural: Patient refused    Marital Status: Married    Current Medications:  Current Outpatient Medications:    acetaminophen (TYLENOL) 500 MG tablet, Take 1,000 mg by mouth every 6 (six) hours as needed for moderate pain., Disp: , Rfl:    ALPRAZolam (XANAX) 0.5 MG tablet, Take 1/2 to 1 tablet by mouth 2 times a day as needed, Disp: 180 tablet, Rfl: 1   apixaban (ELIQUIS) 2.5 MG TABS tablet, Take 1 tablet (2.5 mg) by mouth 2 times daily., Disp: 52 tablet, Rfl: 0   CALCIUM PO, Take 2 tablets by mouth daily at 6 (six) AM. GUMMY, Disp: , Rfl:    cetirizine (ZYRTEC) 10 MG tablet, Take 10  mg by mouth daily., Disp: , Rfl:    cyclobenzaprine (FLEXERIL) 5 MG tablet, Take 1 tablet (5 mg total) by mouth 3 (three) times daily as needed for muscle spasms. Do not take and drive, Disp: 30 tablet, Rfl: 0   ibuprofen (ADVIL) 200 MG tablet, Take 800 mg by mouth every 6 (six) hours as needed for moderate pain., Disp: , Rfl:    metoprolol succinate (TOPROL-XL) 50 MG 24 hr tablet, Take 1 tablet (50 mg total) by mouth daily., Disp: 90 tablet, Rfl: 3   Multiple  Vitamin (MULTIVITAMIN) tablet, Take 1 tablet by mouth daily., Disp: , Rfl:    Multiple Vitamins-Minerals (HAIR SKIN & NAILS) TABS, Take 2 tablets by mouth daily., Disp: , Rfl:    ondansetron (ZOFRAN) 8 MG tablet, Take 1 tablet (8 mg) by mouth every 8 hours as needed for nausea or vomiting., Disp: 20 tablet, Rfl: 1   oxyCODONE (OXY IR/ROXICODONE) 5 MG immediate release tablet, Take 1 tablet (5 mg) by mouth every 4 hours as needed for severe pain. For AFTER surgery, do not take and drive, Disp: 20 tablet, Rfl: 0   propranolol (INDERAL) 10 MG tablet, TAKE 1 TABLET BY MOUTH FOUR TIMES DAILY AS NEEDED FOR PALPITATIONS, Disp: 120 tablet, Rfl: 3   sennosides-docusate sodium (SENOKOT-S) 8.6-50 MG tablet, Take 1-2 tablets by mouth daily as needed for constipation., Disp: , Rfl:    SUNItinib (SUTENT) 50 MG capsule, Take 1 capsule (50 mg total) by mouth daily. Take 4 weeks on and 2 weeks off, start on 05/05/2023, Disp: 28 capsule, Rfl: 0   tamoxifen (NOLVADEX) 20 MG tablet, TAKE 1 TABLET BY MOUTH ONCE A DAY, Disp: 90 tablet, Rfl: 3   traMADol (ULTRAM) 50 MG tablet, Take 1 tablet (50 mg total) by mouth every 6 (six) hours as needed., Disp: 30 tablet, Rfl: 0   venlafaxine XR (EFFEXOR-XR) 75 MG 24 hr capsule, Take 75 mg by mouth daily with breakfast., Disp: , Rfl:    venlafaxine XR (EFFEXOR-XR) 75 MG 24 hr capsule, Take 1 capsule (75 mg) by mouth daily., Disp: 90 capsule, Rfl: 1  Review of Systems: + fatigue, muscle pain/cramp, bruising/bleeding  easily Denies appetite changes, fevers, chills, unexplained weight changes. Denies hearing loss, neck lumps or masses, mouth sores, ringing in ears or voice changes. Denies cough or wheezing.  Denies shortness of breath. Denies chest pain or palpitations. Denies leg swelling. Denies abdominal distention, pain, blood in stools, constipation, diarrhea, nausea, vomiting, or early satiety. Denies pain with intercourse, dysuria, frequency, hematuria or incontinence. Denies hot flashes, pelvic pain, vaginal bleeding or vaginal discharge.   Denies joint pain, back pain. Denies itching, rash, or wounds. Denies dizziness, headaches, numbness or seizures. Denies swollen lymph nodes or glands. Denies anxiety, depression, confusion, or decreased concentration.  Physical Exam: There were no vitals taken for this visit. Vitals taken at appt with Dr. Mosetta Putt General: Alert, oriented, no acute distress. HEENT: Normocephalic, atraumatic, mild jaundice. Chest: Unlabored breathing on room air. Abdomen: soft, nontender.  Normoactive bowel sounds.  No masses or hepatosplenomegaly appreciated.  Well-healed incisions. Extremities: Grossly normal range of motion.  Warm, well perfused.  No edema bilaterally. GU: Normal appearing external genitalia without erythema, excoriation, or lesions.  Speculum exam reveals minimal physiologic discharge.  Cuff is intact without blood in the vaginal vault.  Suture visible.  Bimanual exam reveals cuff intact, no fluctuance or tenderness to palpation.  Laboratory & Radiologic Studies: A. FALLOPIAN TUBE, RIGHT, AND ADJACENT MASS, RESECTION: Fragmented recurrent gastrointestinal stromal tumor (GIST), aggregate tumor size 10.7 cm Ovarian tissue with serosal involvement by GIST - Background uninvolved ovary with normal physiologic changes Benign fallopian tube See comment  B. OVARY AND FALLOPIAN TUBE, LEFT, SALPINGO OOPHORECTOMY: Benign ovary with normal physiologic changes and  follicular cyst, 3.0 cm No evidence of tumor involvement Benign fallopian tube  C. LEFT SIDED TUMOR CAPSULE, BIOPSY: Positive for gastrointestinal stromal tumor (GIST)  D. LEFT PELVIS PERITONEAL NODULE, BIOPSY: Positive for gastrointestinal stromal tumor (GIST)  E. RIGHT PELVIC SIDE WALL, BIOPSY: Positive for gastrointestinal stromal tumor (  GIST)  F. APPENDIX, INCIDENTAL, APPENDECTOMY: Benign appendix with no significant pathologic change No evidence of tumor involvement  G. UTERUS AND CERVIX, HYSTERECTOMY (121 GRAMS): Inactive pattern endometrium - No evidence of hyperplasia, atypia or malignancy Adenomyosis Leiomyomas Uterine serosal adhesions Cervix with no significant pathologic change No evidence of tumor involvement  COMMENT:  Per clinical history, the patient has a history of high risk gastrointestinal stromal tumor (GIST) of the jejunum ([id number]; collected on 04/03/2021).  The current tumor is consistent with recurrent disease.   Assessment & Plan: NACHOLE HEBRON is a 47 y.o. woman with recurrent GIST s/p TRH/BSO at the time of secondary debulking surgery (ovarian adherent to peritoneal GIST recurrence and at the time of frozen section, pathology could not rule out granulosa cell tumor).   The patient is overall doing well from a postoperative standpoint.  Discussed continued expectations and restrictions.  She is seeing medical oncology in the setting of her recurrent GIST tumor, on new therapy. Will follow-up with me as needed.  18 minutes of total time was spent for this patient encounter, including preparation, face-to-face counseling with the patient and coordination of care, and documentation of the encounter.  Eugene Garnet, MD  Division of Gynecologic Oncology  Department of Obstetrics and Gynecology  [facility]

## 2023-05-15 ENCOUNTER — Encounter: Payer: Self-pay | Admitting: Gynecologic Oncology

## 2023-05-15 ENCOUNTER — Inpatient Hospital Stay: Payer: Commercial Managed Care - PPO | Admitting: Gynecologic Oncology

## 2023-05-15 ENCOUNTER — Inpatient Hospital Stay: Payer: Commercial Managed Care - PPO

## 2023-05-15 ENCOUNTER — Inpatient Hospital Stay (HOSPITAL_BASED_OUTPATIENT_CLINIC_OR_DEPARTMENT_OTHER): Payer: Commercial Managed Care - PPO | Admitting: Hematology

## 2023-05-15 ENCOUNTER — Encounter: Payer: Self-pay | Admitting: Hematology

## 2023-05-15 VITALS — BP 124/75 | HR 88 | Temp 98.4°F | Resp 16 | Ht 62.0 in | Wt 126.3 lb

## 2023-05-15 DIAGNOSIS — C49A3 Gastrointestinal stromal tumor of small intestine: Secondary | ICD-10-CM

## 2023-05-15 DIAGNOSIS — C50412 Malignant neoplasm of upper-outer quadrant of left female breast: Secondary | ICD-10-CM | POA: Diagnosis not present

## 2023-05-15 DIAGNOSIS — N839 Noninflammatory disorder of ovary, fallopian tube and broad ligament, unspecified: Secondary | ICD-10-CM | POA: Diagnosis not present

## 2023-05-15 DIAGNOSIS — Z90710 Acquired absence of both cervix and uterus: Secondary | ICD-10-CM

## 2023-05-15 DIAGNOSIS — Q8500 Neurofibromatosis, unspecified: Secondary | ICD-10-CM | POA: Diagnosis not present

## 2023-05-15 DIAGNOSIS — R252 Cramp and spasm: Secondary | ICD-10-CM | POA: Diagnosis not present

## 2023-05-15 DIAGNOSIS — N9489 Other specified conditions associated with female genital organs and menstrual cycle: Secondary | ICD-10-CM

## 2023-05-15 DIAGNOSIS — I700 Atherosclerosis of aorta: Secondary | ICD-10-CM | POA: Diagnosis not present

## 2023-05-15 DIAGNOSIS — R110 Nausea: Secondary | ICD-10-CM | POA: Diagnosis not present

## 2023-05-15 DIAGNOSIS — Z170 Estrogen receptor positive status [ER+]: Secondary | ICD-10-CM | POA: Diagnosis not present

## 2023-05-15 DIAGNOSIS — Z9079 Acquired absence of other genital organ(s): Secondary | ICD-10-CM

## 2023-05-15 DIAGNOSIS — Z90721 Acquired absence of ovaries, unilateral: Secondary | ICD-10-CM

## 2023-05-15 DIAGNOSIS — C786 Secondary malignant neoplasm of retroperitoneum and peritoneum: Secondary | ICD-10-CM | POA: Diagnosis not present

## 2023-05-15 DIAGNOSIS — K5900 Constipation, unspecified: Secondary | ICD-10-CM | POA: Diagnosis not present

## 2023-05-15 LAB — CMP (CANCER CENTER ONLY)
ALT: 25 U/L (ref 0–44)
AST: 29 U/L (ref 15–41)
Albumin: 4.2 g/dL (ref 3.5–5.0)
Alkaline Phosphatase: 45 U/L (ref 38–126)
Anion gap: 8 (ref 5–15)
BUN: 16 mg/dL (ref 6–20)
CO2: 25 mmol/L (ref 22–32)
Calcium: 8.9 mg/dL (ref 8.9–10.3)
Chloride: 108 mmol/L (ref 98–111)
Creatinine: 0.67 mg/dL (ref 0.44–1.00)
GFR, Estimated: >60 mL/min (ref >60)
Glucose, Bld: 120 mg/dL — ABNORMAL HIGH (ref 70–99)
Potassium: 3.6 mmol/L (ref 3.5–5.1)
Sodium: 141 mmol/L (ref 135–145)
Total Bilirubin: 0.4 mg/dL (ref 0.3–1.2)
Total Protein: 6.8 g/dL (ref 6.5–8.1)

## 2023-05-15 LAB — CBC WITH DIFFERENTIAL (CANCER CENTER ONLY)
Abs Immature Granulocytes: 0.01 10*3/uL (ref 0.00–0.07)
Basophils Absolute: 0.1 10*3/uL (ref 0.0–0.1)
Basophils Relative: 1 %
Eosinophils Absolute: 0.2 10*3/uL (ref 0.0–0.5)
Eosinophils Relative: 3 %
HCT: 38.9 % (ref 36.0–46.0)
Hemoglobin: 13.1 g/dL (ref 12.0–15.0)
Immature Granulocytes: 0 %
Lymphocytes Relative: 39 %
Lymphs Abs: 2.4 10*3/uL (ref 0.7–4.0)
MCH: 29.2 pg (ref 26.0–34.0)
MCHC: 33.7 g/dL (ref 30.0–36.0)
MCV: 86.8 fL (ref 80.0–100.0)
Monocytes Absolute: 0.5 10*3/uL (ref 0.1–1.0)
Monocytes Relative: 8 %
Neutro Abs: 3 10*3/uL (ref 1.7–7.7)
Neutrophils Relative %: 49 %
Platelet Count: 250 10*3/uL (ref 150–400)
RBC: 4.48 MIL/uL (ref 3.87–5.11)
RDW: 12.2 % (ref 11.5–15.5)
WBC Count: 6.1 10*3/uL (ref 4.0–10.5)
nRBC: 0 % (ref 0.0–0.2)

## 2023-05-15 LAB — FERRITIN: Ferritin: 43 ng/mL (ref 11–307)

## 2023-05-15 LAB — TSH: TSH: 3.637 u[IU]/mL (ref 0.350–4.500)

## 2023-05-15 LAB — MISCELLANEOUS TEST

## 2023-05-15 NOTE — Progress Notes (Signed)
[facility]   [phone] [phone]   Clinic Follow up Note   Patient Care Team: Eden Lathe as PCP - General (Family Medicine) [name], Deloris Ping, MD as PCP - Cardiology (Cardiology) Antony Blackbird, MD as Consulting Physician (Radiation Oncology) Serena Croissant, MD as Consulting Physician (Hematology and Oncology) Emelia Loron, MD as Consulting Physician (General Surgery) Malachy Mood, MD as Consulting Physician (Oncology)  Date of Service:  05/15/2023  CHIEF COMPLAINT: f/u of  GIST and h/o breast cancer    CURRENT THERAPY:   Sunitinib starting 05/05/2023  ASSESSMENT:  Anne Horton is a 47 y.o. female with   GIST (gastrointestinal stromal tumor) of small bowel, malignant (HCC) Multifocal GIST of small bowel, pT4N0Mo, stage IIIB, high grade, mitotic rate 9/HPF, pertoneal metastasis in 03/2023, KIT, PDGFRA mutation (-) -Diagnosed in 02/2021 -resection on 02/26/21 under Dr. Donell Beers showed multiple tumor in small bowel, with three small lesions which were unable to be resected. Path confirmed GIST, high grade, margins and lymph nodes were negative. -she began imatinib 400 mg daily on 05/07/21. She has been tolerating well without issue. -repeated CT have not sure evidence of recurrence until 03/08/2023, and showed a large right ovarian mass. -She underwent exploratory laparoscope by Dr. Donell Beers and GYN Dr. Pricilla Holm on 04/09/2023, she underwent resection of her right ovarian mass (path confirmed GIST), BSO and hysterectomy.  Unfortunately she was found to have multiple peritoneal metastasis, biopsy confirmed metastatic GIST. -Due to the significant disease progression to peritoneum, I recommend changing her treatment to Sunitinib, I discussed the benefit and potential side effects, especially hypertension, thyroid dysfunction, thrombosis, hepatic toxicity, pancreatitis, GI perforation, hemorrhage, diarrhea, nausea, hand-and-foot syndrome, etc.   She agrees to proceed.  Will let her try 50 mg daily on day 1-28 of 42 days, if she does not tolerate well, will change to 37.5 mg daily  Neurofibromatosis -genetic testing 05/15/21 confirmed pathogenic variant in NF1. -There is also an increased chance for certain cancers including a type of eye cancer (optic nerve glioma), cancer of the tissues that cover the nerves (malignant peripheral nerve sheath tumors or MPNST), brain tumors, breast cancer, digestive tract tumors (gastrointestinal stromal tumors or GIST) and adrenal gland tumors (pheochromocytoma or [facility]).    PLAN: - reviewed [facility] results - continue Tamoxifen and Sutent, she is overall tolerating well with mild side effects (bruise and leg cramps)  - f/u and lab in 3 weeks - reviewed labs    SUMMARY OF ONCOLOGIC HISTORY: Oncology History Overview Note  Cancer Staging GIST (gastrointestinal stromal tumor) of small bowel, malignant (HCC) Staging form: Gastrointestinal Stromal Tumor - Small Intestinal, Esophageal, Colorectal, Mesenteric, and Peritoneal GIST, AJCC 8th Edition - Pathologic stage from 04/03/2021: Stage IIIB (pT4, pN0, cM0, Mitotic Rate: High) - Signed by Malachy Mood, MD on 04/23/2021  Malignant neoplasm of upper-outer quadrant of left breast in female, estrogen receptor positive (HCC) Staging form: Breast, AJCC 8th Edition - Clinical stage from 03/20/2018: Stage IA (cT1c, cN0, cM0, G2, ER+, PR+, HER2-) - Signed by Loa Socks, NP on 05/27/2018 - Pathologic stage from 05/07/2018: Stage IA (pT1c, pN0, cM0, G1, ER+, PR+, HER2-, Oncotype DX score: 11) - Signed by Loa Socks, NP on 05/27/2018    Malignant neoplasm of upper-outer quadrant of left breast in female, estrogen receptor positive (HCC)  03/20/2018 Initial Diagnosis   Screening detected left breast spiculated mass by ultrasound measured 1.2 cm.  Biopsy revealed invasive ductal carcinoma grade 1-2 with high-grade DCIS, ER  95%, PR 95%, Ki-67 1%, HER-2  negative ratio 1.47, T1CN0 stage I a clinical stage   03/20/2018 Cancer Staging   Staging form: Breast, AJCC 8th Edition - Clinical stage from 03/20/2018: Stage IA (cT1c, cN0, cM0, G2, ER+, PR+, HER2-) - Signed by Loa Socks, NP on 05/27/2018   04/08/2018 Genetic Testing   NF1  c.2325G>A (Silent) pathogenic variant was identified.  This variant was reclassified from a VUS to pathogenic on June 20, 2021.  The Common Hereditary Cancer Panel offered by [facility] includes sequencing and/or deletion duplication testing of the following 47 genes: APC, ATM, AXIN2, BARD1, BMPR1A, BRCA1, BRCA2, BRIP1, CDH1, CDKN2A (p14ARF), CDKN2A (p16INK4a), CKD4, CHEK2, CTNNA1, DICER1, EPCAM (Deletion/duplication testing only), GREM1 (promoter region deletion/duplication testing only), KIT, MEN1, MLH1, MSH2, MSH3, MSH6, MUTYH, NBN, NF1, NHTL1, PALB2, PDGFRA, PMS2, POLD1, POLE, PTEN, RAD50, RAD51C, RAD51D, SDHB, SDHC, SDHD, SMAD4, SMARCA4. STK11, TP53, TSC1, TSC2, and VHL.  The following genes were evaluated for sequence changes only: SDHA and HOXB13 c.251G>A variant only.  Results: No pathogenic variants identified.  A Variant of uncertain significance in the gene NF1 was detected c.2325G>A (Silent). The date of this test report is 04/03/2018.    05/07/2018 Surgery   Left lumpectomy: IDC grade 1, 1.2 cm, intermediate grade DCIS, margins negative,PASH, 0/7 sentinel lymph nodes negative, ER 95%, PR 95%, Ki-67 1%, HER-2 negative ratio 1.47, T1CN0 stage Ia   05/07/2018 Cancer Staging   Staging form: Breast, AJCC 8th Edition - Pathologic stage from 05/07/2018: Stage IA (pT1c, pN0, cM0, G1, ER+, PR+, HER2-, Oncotype DX score: 11) - Signed by Loa Socks, NP on 05/27/2018   05/25/2018 Oncotype testing   Oncotype DX score 11: 3% risk of distant recurrence of 9 years, low risk   06/24/2018 - 08/03/2018 Radiation Therapy   Adjuvant radiation therapy   09/2018 -  Anti-estrogen oral therapy   Tamoxifen daily    GIST (gastrointestinal stromal tumor) of small bowel, malignant (HCC)  04/03/2021 Cancer Staging   Staging form: Gastrointestinal Stromal Tumor - Small Intestinal, Esophageal, Colorectal, Mesenteric, and Peritoneal GIST, AJCC 8th Edition - Pathologic stage from 04/03/2021: Stage IIIB (pT4, pN0, cM0, Mitotic Rate: High) - Signed by Malachy Mood, MD on 04/23/2021 Stage prefix: Initial diagnosis Histologic grade (G): High grade Histologic grading system: 2 grade system Residual tumor (R): R0 - None   04/23/2021 Initial Diagnosis   GIST (gastrointestinal stromal tumor) of small bowel, malignant (HCC)   05/15/2021 Imaging   CT Chest w/o contrast  IMPRESSION: 1. 6 mm nodular opacity in the posterior right lower lobe with a similar size focus of architectural distortion/scarring in this region on the remote study from 17 years ago, suggesting that this is simply scar although the finding is more confluent on today's study. While almost assuredly benign, consider follow-up CT chest in 6 months to ensure stability. 2. No evidence for metastatic disease in the chest.   08/21/2021 Imaging   EXAM: CT ABDOMEN AND PELVIS WITH CONTRAST  IMPRESSION: Interval surgical resection of large left abdominal cystic mass since prior exam. No evidence of tumor recurrence or metastatic disease within the abdomen or pelvis.   8 mm low-attenuation lesion in pancreatic head, not definitely seen on prior exams. Consider further characterization with MRI, or continued surveillance on follow-up CT.   Small posterior uterine fibroid.   08/23/2021 Imaging   EXAM: MRI ABDOMEN WITHOUT AND WITH CONTRAST  IMPRESSION: 1. No evidence of pancreatic mass or suspicious correlate for the CT abnormality, which may have  been artifactual or possibly related to heterogeneous fatty replacement. 2. No acute process or evidence of metastatic disease in the abdomen. 3.  Aortic Atherosclerosis (ICD10-I70.0).   04/09/2023 Pathology  Results     FINAL MICROSCOPIC DIAGNOSIS:  A. FALLOPIAN TUBE, RIGHT, AND ADJACENT MASS, RESECTION: Fragmented recurrent gastrointestinal stromal tumor (GIST), aggregate tumor size 10.7 cm Ovarian tissue with serosal involvement by GIST - Background uninvolved ovary with normal physiologic changes Benign fallopian tube See comment  B. OVARY AND FALLOPIAN TUBE, LEFT, SALPINGO OOPHORECTOMY: Benign ovary with normal physiologic changes and follicular cyst, 3.0 cm No evidence of tumor involvement Benign fallopian tube  C. LEFT SIDED TUMOR CAPSULE, BIOPSY: Positive for gastrointestinal stromal tumor (GIST)  D. LEFT PELVIS PERITONEAL NODULE, BIOPSY: Positive for gastrointestinal stromal tumor (GIST)  E. RIGHT PELVIC SIDE WALL, BIOPSY: Positive for gastrointestinal stromal tumor (GIST)  F. APPENDIX, INCIDENTAL, APPENDECTOMY: Benign appendix with no significant pathologic change No evidence of tumor involvement  G. UTERUS AND CERVIX, HYSTERECTOMY (121 GRAMS): Inactive pattern endometrium - No evidence of hyperplasia, atypia or malignancy Adenomyosis Leiomyomas Uterine serosal adhesions Cervix with no significant pathologic change No evidence of tumor involvement     Miscellaneous   TEMPUS  GEN OMIC VARIA N TS  Potentially Actionable / Biologically Relevant No reportable pathogenic variants were found.  Tumor / Normal Matched Analysis (Potential Germline) No normal sample was received, therefore tumor/normal matched analysis was not performed.  Pertinent Negatives  No pathogenic single nucleotide variants, indels, or copy number changes found in: IMMUN OTHERAPY MARKERS  Tumor Mutational Burden  1.23m/mb   12th percentile  Microsatellite Instability Status -Stable      INTERVAL HISTORY:  Anne Horton is here for a follow up of GIST and h/o breast cancer . She was last seen by me on 04/21/2023. She presents to the clinic with her husband. She is having a lot  of bruising that just come up. Leg cramps are tolerable. Some nausea but tolerable.      All other systems were reviewed with the patient and are negative.  MEDICAL HISTORY:  Past Medical History:  Diagnosis Date   Allergy    Anemia    Anginal pain (HCC)    Anxiety    Breast cancer (HCC) 05/07/2018   left invasive ductal    Cancer (HCC)    Chest pain    Depression    Family history of breast cancer    Fibromyalgia    neurofibromatosis   H/O: depression    Hx of migraines    Mitral regurgitation    mild per echo EF 55-60%   Neurofibromatosis (HCC)    Palpitations    Personal history of radiation therapy 08/10/2018   left breast 06/24/2018-08/10/2018  Dr Antony Blackbird   Tachycardia    Hx. of   Wears contact lenses     SURGICAL HISTORY: Past Surgical History:  Procedure Laterality Date   BOWEL RESECTION N/A 04/03/2021   Procedure: SMALL BOWEL RESECTION;  Surgeon: [name], MD;  Location: [facility];  Service: General;  Laterality: N/A;   BREAST BIOPSY     BREAST LUMPECTOMY Left 05/07/2018   Invasive ductal    BREAST LUMPECTOMY WITH RADIOACTIVE SEED AND SENTINEL LYMPH NODE BIOPSY Left 05/07/2018   Procedure: BREAST LUMPECTOMY WITH RADIOACTIVE SEED AND SENTINEL LYMPH NODE BIOPSY;  Surgeon: Emelia Loron, MD;  Location: [facility];  Service: General;  Laterality: Left;   CESAREAN SECTION     CHOLECYSTECTOMY N/A 01/04/2015   Procedure:  LAPAROSCOPIC CHOLECYSTECTOMY WITH INTRAOPERATIVE CHOLANGIOGRAM;  Surgeon: Harriette Bouillon, MD;  Location: [facility];  Service: General;  Laterality: N/A;   COLONOSCOPY     ESOPHAGOGASTRODUODENOSCOPY     LAPAROSCOPY N/A 04/09/2023   Procedure: XI ROBOT ASSISTED DIAGNOSTIC LAPAROSCOPY, ROBOTIC assisted OPEN MASS EXCISION, TOTAL ROBOTIC HYSTERECTOMY WITH BILATERAL SALPINGO OOPHORECTOMY, CYSTOSCOPY;  Surgeon: Carver Fila, MD;  Location: [facility];  Service: Gynecology;  Laterality: N/A;   LAPAROTOMY N/A  04/03/2021   Procedure: EXPLORATORY LAPAROTOMY WITH RESECTION OF ABDOMINAL MASS;  Surgeon: [name], MD;  Location: [facility];  Service: General;  Laterality: N/A;   MASTOPEXY Bilateral 09/05/2020   Procedure: BILATERAL BREAST MASTOPEXY;  Surgeon: Glenna Fellows, MD;  Location: [facility];  Service: Plastics;  Laterality: Bilateral;   OMENTECTOMY N/A 04/03/2021   Procedure: OMENTECTOMY;  Surgeon: [name], MD;  Location: [facility];  Service: General;  Laterality: N/A;   ROBOTIC ASSISTED LAPAROSCOPIC LYSIS OF ADHESION N/A 04/09/2023   Procedure: XI ROBOTIC ASSISTED REMOVAL OF PELVIC MASS; INCIDENTAL LAPAROSCOPIC APPENDECTOMY;  Surgeon: [name], MD;  Location: [facility];  Service: General;  Laterality: N/A;   WISDOM TOOTH EXTRACTION      I have reviewed the social history and family history with the patient and they are unchanged from previous note.  ALLERGIES:  is allergic to iron sucrose and latex.  MEDICATIONS:  Current Outpatient Medications  Medication Sig Dispense Refill   acetaminophen (TYLENOL) 500 MG tablet Take 1,000 mg by mouth every 6 (six) hours as needed for moderate pain.     ALPRAZolam (XANAX) 0.5 MG tablet Take 1/2 to 1 tablet by mouth 2 times a day as needed 180 tablet 1   apixaban (ELIQUIS) 2.5 MG TABS tablet Take 1 tablet (2.5 mg) by mouth 2 times daily. 52 tablet 0   CALCIUM PO Take 2 tablets by mouth daily at 6 (six) AM. GUMMY     cetirizine (ZYRTEC) 10 MG tablet Take 10 mg by mouth daily.     cyclobenzaprine (FLEXERIL) 5 MG tablet Take 1 tablet (5 mg total) by mouth 3 (three) times daily as needed for muscle spasms. Do not take and drive 30 tablet 0   ibuprofen (ADVIL) 200 MG tablet Take 800 mg by mouth every 6 (six) hours as needed for moderate pain.     metoprolol succinate (TOPROL-XL) 50 MG 24 hr tablet Take 1 tablet (50 mg total) by mouth daily. 90 tablet 3   Multiple Vitamin (MULTIVITAMIN) tablet Take 1 tablet by mouth daily.     Multiple  Vitamins-Minerals (HAIR SKIN & NAILS) TABS Take 2 tablets by mouth daily.     ondansetron (ZOFRAN) 8 MG tablet Take 1 tablet (8 mg) by mouth every 8 hours as needed for nausea or vomiting. 20 tablet 1   oxyCODONE (OXY IR/ROXICODONE) 5 MG immediate release tablet Take 1 tablet (5 mg) by mouth every 4 hours as needed for severe pain. For AFTER surgery, do not take and drive 20 tablet 0   propranolol (INDERAL) 10 MG tablet TAKE 1 TABLET BY MOUTH FOUR TIMES DAILY AS NEEDED FOR PALPITATIONS 120 tablet 3   sennosides-docusate sodium (SENOKOT-S) 8.6-50 MG tablet Take 1-2 tablets by mouth daily as needed for constipation.     SUNItinib (SUTENT) 50 MG capsule Take 1 capsule (50 mg total) by mouth daily. Take 4 weeks on and 2 weeks off, start on 05/05/2023 28 capsule 0   tamoxifen (NOLVADEX) 20 MG tablet TAKE 1 TABLET BY MOUTH ONCE  A DAY 90 tablet 3   traMADol (ULTRAM) 50 MG tablet Take 1 tablet (50 mg total) by mouth every 6 (six) hours as needed. 30 tablet 0   venlafaxine XR (EFFEXOR-XR) 75 MG 24 hr capsule Take 75 mg by mouth daily with breakfast.     venlafaxine XR (EFFEXOR-XR) 75 MG 24 hr capsule Take 1 capsule (75 mg) by mouth daily. 90 capsule 1   No current facility-administered medications for this visit.    PHYSICAL EXAMINATION: ECOG PERFORMANCE STATUS: 1 - Symptomatic but completely ambulatory  Vitals:   05/15/23 1337  BP: 124/75  Pulse: 88  Resp: 16  Temp: 98.4 F (36.9 C)  SpO2: 100%   Wt Readings from Last 3 Encounters:  05/15/23 126 lb 4.8 oz (57.3 kg)  04/21/23 129 lb 12.8 oz (58.9 kg)  04/09/23 134 lb 11.2 oz (61.1 kg)     GENERAL:alert, no distress and comfortable SKIN: skin color, texture, turgor are normal, no rashes or significant lesions EYES: normal, Conjunctiva are pink and non-injected, sclera clear ABDOMEN:abdomen soft, non-tender and normal bowel sounds, incisions healed well  Musculoskeletal:no cyanosis of digits and no clubbing  NEURO: alert & oriented x 3 with  fluent speech, no focal motor/sensory deficits  LABORATORY DATA:  I have reviewed the data as listed    Latest Ref Rng & Units 05/15/2023   12:36 PM 04/12/2023    6:02 AM 04/11/2023    4:22 AM  CBC  WBC 4.0 - 10.5 K/uL 6.1  11.3  13.4   Hemoglobin 12.0 - 15.0 g/dL 47.4  25.9  9.6   Hematocrit 36.0 - 46.0 % 38.9  31.8  29.6   Platelets 150 - 400 K/uL 250  335  287         Latest Ref Rng & Units 05/15/2023   12:36 PM 04/12/2023    6:02 AM 04/11/2023    4:22 AM  CMP  Glucose 70 - 99 mg/dL 563  875  643   BUN 6 - 20 mg/dL 16  6  <5   Creatinine 0.44 - 1.00 mg/dL 3.29  5.18  8.41   Sodium 135 - 145 mmol/L 141  139  139   Potassium 3.5 - 5.1 mmol/L 3.6  3.5  3.6   Chloride 98 - 111 mmol/L 108  106  107   CO2 22 - 32 mmol/L 25  23  23    Calcium 8.9 - 10.3 mg/dL 8.9  8.3  8.0   Total Protein 6.5 - 8.1 g/dL 6.8     Total Bilirubin 0.3 - 1.2 mg/dL 0.4     Alkaline Phos 38 - 126 U/L 45     AST 15 - 41 U/L 29     ALT 0 - 44 U/L 25         RADIOGRAPHIC STUDIES: I have personally reviewed the radiological images as listed and agreed with the findings in the report. No results found.    No orders of the defined types were placed in this encounter.  All questions were answered. The patient knows to call the clinic with any problems, questions or concerns. No barriers to learning was detected. The total time spent in the appointment was 25 minutes.     Malachy Mood, MD 05/15/2023   I, Sharlette Dense, CMA, am acting as scribe for Malachy Mood, MD.   I have reviewed the above documentation for accuracy and completeness, and I agree with the above.

## 2023-05-15 NOTE — Assessment & Plan Note (Signed)
 Multifocal GIST of small bowel, pT4N0Mo, stage IIIB, high grade, mitotic rate 9/HPF, pertoneal metastasis in 03/2023, KIT, PDGFRA mutation (-) -Diagnosed in 02/2021 -resection on 02/26/21 under Dr. Donell Beers showed multiple tumor in small bowel, with three small lesions which were unable to be resected. Path confirmed GIST, high grade, margins and lymph nodes were negative. -she began imatinib 400 mg daily on 05/07/21. She has been tolerating well without issue. -repeated CT have not sure evidence of recurrence until 03/08/2023, and showed a large right ovarian mass. -She underwent exploratory laparoscope by Dr. Donell Beers and GYN Dr. Pricilla Holm on 04/09/2023, she underwent resection of her right ovarian mass (path confirmed GIST), BSO and hysterectomy.  Unfortunately she was found to have multiple peritoneal metastasis, biopsy confirmed metastatic GIST. -Due to the significant disease progression to peritoneum, I recommend changing her treatment to Sunitinib, I discussed the benefit and potential side effects, especially hypertension, thyroid dysfunction, thrombosis, hepatic toxicity, pancreatitis, GI perforation, hemorrhage, diarrhea, nausea, hand-and-foot syndrome, etc.  She agrees to proceed.  Will let her try 50 mg daily on day 1-28 of 42 days, if she does not tolerate well, will change to 37.5 mg daily

## 2023-05-15 NOTE — Assessment & Plan Note (Signed)
-  genetic testing 05/15/21 confirmed pathogenic variant in NF1. -There is also an increased chance for certain cancers including a type of eye cancer (optic nerve glioma), cancer of the tissues that cover the nerves (malignant peripheral nerve sheath tumors or MPNST), brain tumors, breast cancer, digestive tract tumors (gastrointestinal stromal tumors or GIST) and adrenal gland tumors (pheochromocytoma or [facility]).

## 2023-05-15 NOTE — Patient Instructions (Signed)
It was good to see you today.  You are healing well from surgery!  Please remember, no heavy lifting for 6 weeks after surgery and nothing in the vagina for at least 10 weeks.

## 2023-05-20 ENCOUNTER — Other Ambulatory Visit (HOSPITAL_COMMUNITY): Payer: Self-pay

## 2023-05-21 DIAGNOSIS — C49A3 Gastrointestinal stromal tumor of small intestine: Secondary | ICD-10-CM | POA: Diagnosis not present

## 2023-05-21 DIAGNOSIS — C786 Secondary malignant neoplasm of retroperitoneum and peritoneum: Secondary | ICD-10-CM | POA: Diagnosis not present

## 2023-05-22 DIAGNOSIS — N736 Female pelvic peritoneal adhesions (postinfective): Secondary | ICD-10-CM | POA: Diagnosis not present

## 2023-05-22 DIAGNOSIS — C7961 Secondary malignant neoplasm of right ovary: Secondary | ICD-10-CM | POA: Diagnosis not present

## 2023-05-22 DIAGNOSIS — C49A0 Gastrointestinal stromal tumor, unspecified site: Secondary | ICD-10-CM | POA: Diagnosis not present

## 2023-05-22 DIAGNOSIS — N8003 Adenomyosis of the uterus: Secondary | ICD-10-CM | POA: Diagnosis not present

## 2023-05-22 DIAGNOSIS — C7989 Secondary malignant neoplasm of other specified sites: Secondary | ICD-10-CM | POA: Diagnosis not present

## 2023-05-22 DIAGNOSIS — C786 Secondary malignant neoplasm of retroperitoneum and peritoneum: Secondary | ICD-10-CM | POA: Diagnosis not present

## 2023-05-22 DIAGNOSIS — D260 Other benign neoplasm of cervix uteri: Secondary | ICD-10-CM | POA: Diagnosis not present

## 2023-05-22 DIAGNOSIS — C49A3 Gastrointestinal stromal tumor of small intestine: Secondary | ICD-10-CM | POA: Diagnosis not present

## 2023-05-26 ENCOUNTER — Encounter: Payer: Self-pay | Admitting: Hematology

## 2023-05-26 ENCOUNTER — Other Ambulatory Visit (HOSPITAL_COMMUNITY): Payer: Self-pay

## 2023-05-27 ENCOUNTER — Inpatient Hospital Stay: Payer: Commercial Managed Care - PPO | Attending: Hematology

## 2023-05-27 DIAGNOSIS — F419 Anxiety disorder, unspecified: Secondary | ICD-10-CM | POA: Insufficient documentation

## 2023-05-27 DIAGNOSIS — M797 Fibromyalgia: Secondary | ICD-10-CM | POA: Diagnosis not present

## 2023-05-27 DIAGNOSIS — Z170 Estrogen receptor positive status [ER+]: Secondary | ICD-10-CM | POA: Diagnosis not present

## 2023-05-27 DIAGNOSIS — D259 Leiomyoma of uterus, unspecified: Secondary | ICD-10-CM | POA: Diagnosis not present

## 2023-05-27 DIAGNOSIS — I700 Atherosclerosis of aorta: Secondary | ICD-10-CM | POA: Diagnosis not present

## 2023-05-27 DIAGNOSIS — N839 Noninflammatory disorder of ovary, fallopian tube and broad ligament, unspecified: Secondary | ICD-10-CM | POA: Diagnosis not present

## 2023-05-27 DIAGNOSIS — N8003 Adenomyosis of the uterus: Secondary | ICD-10-CM | POA: Diagnosis not present

## 2023-05-27 DIAGNOSIS — Z90710 Acquired absence of both cervix and uterus: Secondary | ICD-10-CM | POA: Diagnosis not present

## 2023-05-27 DIAGNOSIS — Z90722 Acquired absence of ovaries, bilateral: Secondary | ICD-10-CM | POA: Insufficient documentation

## 2023-05-27 DIAGNOSIS — R232 Flushing: Secondary | ICD-10-CM | POA: Diagnosis not present

## 2023-05-27 DIAGNOSIS — Z79810 Long term (current) use of selective estrogen receptor modulators (SERMs): Secondary | ICD-10-CM | POA: Diagnosis not present

## 2023-05-27 DIAGNOSIS — G4700 Insomnia, unspecified: Secondary | ICD-10-CM | POA: Diagnosis not present

## 2023-05-27 DIAGNOSIS — F32A Depression, unspecified: Secondary | ICD-10-CM | POA: Diagnosis not present

## 2023-05-27 DIAGNOSIS — C786 Secondary malignant neoplasm of retroperitoneum and peritoneum: Secondary | ICD-10-CM | POA: Insufficient documentation

## 2023-05-27 DIAGNOSIS — Z79899 Other long term (current) drug therapy: Secondary | ICD-10-CM | POA: Diagnosis not present

## 2023-05-27 DIAGNOSIS — C50412 Malignant neoplasm of upper-outer quadrant of left female breast: Secondary | ICD-10-CM | POA: Insufficient documentation

## 2023-05-27 DIAGNOSIS — Z9049 Acquired absence of other specified parts of digestive tract: Secondary | ICD-10-CM | POA: Diagnosis not present

## 2023-05-27 DIAGNOSIS — Z7901 Long term (current) use of anticoagulants: Secondary | ICD-10-CM | POA: Diagnosis not present

## 2023-05-27 DIAGNOSIS — Z923 Personal history of irradiation: Secondary | ICD-10-CM | POA: Insufficient documentation

## 2023-05-27 DIAGNOSIS — C49A3 Gastrointestinal stromal tumor of small intestine: Secondary | ICD-10-CM | POA: Diagnosis not present

## 2023-05-27 LAB — CBC WITH DIFFERENTIAL (CANCER CENTER ONLY)
Abs Immature Granulocytes: 0.01 10*3/uL (ref 0.00–0.07)
Basophils Absolute: 0 10*3/uL (ref 0.0–0.1)
Basophils Relative: 1 %
Eosinophils Absolute: 0.2 10*3/uL (ref 0.0–0.5)
Eosinophils Relative: 4 %
HCT: 34.3 % — ABNORMAL LOW (ref 36.0–46.0)
Hemoglobin: 11.6 g/dL — ABNORMAL LOW (ref 12.0–15.0)
Immature Granulocytes: 0 %
Lymphocytes Relative: 38 %
Lymphs Abs: 1.5 10*3/uL (ref 0.7–4.0)
MCH: 29.1 pg (ref 26.0–34.0)
MCHC: 33.8 g/dL (ref 30.0–36.0)
MCV: 86 fL (ref 80.0–100.0)
Monocytes Absolute: 0.3 10*3/uL (ref 0.1–1.0)
Monocytes Relative: 8 %
Neutro Abs: 1.9 10*3/uL (ref 1.7–7.7)
Neutrophils Relative %: 49 %
Platelet Count: 163 10*3/uL (ref 150–400)
RBC: 3.99 MIL/uL (ref 3.87–5.11)
RDW: 12.9 % (ref 11.5–15.5)
WBC Count: 3.8 10*3/uL — ABNORMAL LOW (ref 4.0–10.5)
nRBC: 0 % (ref 0.0–0.2)

## 2023-05-27 LAB — FERRITIN: Ferritin: 61 ng/mL (ref 11–307)

## 2023-05-27 LAB — CMP (CANCER CENTER ONLY)
ALT: 23 U/L (ref 0–44)
AST: 25 U/L (ref 15–41)
Albumin: 3.8 g/dL (ref 3.5–5.0)
Alkaline Phosphatase: 48 U/L (ref 38–126)
Anion gap: 7 (ref 5–15)
BUN: 14 mg/dL (ref 6–20)
CO2: 29 mmol/L (ref 22–32)
Calcium: 8.9 mg/dL (ref 8.9–10.3)
Chloride: 105 mmol/L (ref 98–111)
Creatinine: 0.66 mg/dL (ref 0.44–1.00)
GFR, Estimated: >60 mL/min (ref >60)
Glucose, Bld: 111 mg/dL — ABNORMAL HIGH (ref 70–99)
Potassium: 3.3 mmol/L — ABNORMAL LOW (ref 3.5–5.1)
Sodium: 141 mmol/L (ref 135–145)
Total Bilirubin: 0.5 mg/dL (ref 0.3–1.2)
Total Protein: 6.3 g/dL — ABNORMAL LOW (ref 6.5–8.1)

## 2023-05-28 ENCOUNTER — Encounter: Payer: Self-pay | Admitting: Hematology

## 2023-05-28 ENCOUNTER — Other Ambulatory Visit: Payer: Self-pay | Admitting: Hematology

## 2023-05-28 ENCOUNTER — Other Ambulatory Visit (HOSPITAL_COMMUNITY): Payer: Self-pay

## 2023-05-28 MED ORDER — AMOXICILLIN 500 MG PO CAPS
ORAL_CAPSULE | ORAL | 0 refills | Status: DC
  Filled 2023-05-28: qty 22, 7d supply, fill #0

## 2023-05-28 MED ORDER — POTASSIUM CHLORIDE CRYS ER 10 MEQ PO TBCR
10.0000 meq | EXTENDED_RELEASE_TABLET | Freq: Every day | ORAL | 0 refills | Status: DC
  Filled 2023-05-28: qty 7, 7d supply, fill #0

## 2023-06-03 ENCOUNTER — Inpatient Hospital Stay: Payer: Commercial Managed Care - PPO

## 2023-06-03 ENCOUNTER — Inpatient Hospital Stay (HOSPITAL_BASED_OUTPATIENT_CLINIC_OR_DEPARTMENT_OTHER): Payer: Commercial Managed Care - PPO | Admitting: Hematology

## 2023-06-03 ENCOUNTER — Encounter: Payer: Self-pay | Admitting: Hematology

## 2023-06-03 VITALS — BP 130/69 | HR 81 | Temp 98.7°F | Resp 19 | Ht 62.0 in | Wt 132.0 lb

## 2023-06-03 DIAGNOSIS — I700 Atherosclerosis of aorta: Secondary | ICD-10-CM | POA: Diagnosis not present

## 2023-06-03 DIAGNOSIS — C49A3 Gastrointestinal stromal tumor of small intestine: Secondary | ICD-10-CM | POA: Diagnosis not present

## 2023-06-03 DIAGNOSIS — C50412 Malignant neoplasm of upper-outer quadrant of left female breast: Secondary | ICD-10-CM | POA: Diagnosis not present

## 2023-06-03 DIAGNOSIS — Z170 Estrogen receptor positive status [ER+]: Secondary | ICD-10-CM

## 2023-06-03 DIAGNOSIS — F32A Depression, unspecified: Secondary | ICD-10-CM | POA: Diagnosis not present

## 2023-06-03 DIAGNOSIS — R232 Flushing: Secondary | ICD-10-CM | POA: Diagnosis not present

## 2023-06-03 DIAGNOSIS — G4700 Insomnia, unspecified: Secondary | ICD-10-CM | POA: Diagnosis not present

## 2023-06-03 DIAGNOSIS — C786 Secondary malignant neoplasm of retroperitoneum and peritoneum: Secondary | ICD-10-CM | POA: Diagnosis not present

## 2023-06-03 DIAGNOSIS — D259 Leiomyoma of uterus, unspecified: Secondary | ICD-10-CM | POA: Diagnosis not present

## 2023-06-03 DIAGNOSIS — F419 Anxiety disorder, unspecified: Secondary | ICD-10-CM | POA: Diagnosis not present

## 2023-06-03 NOTE — Progress Notes (Signed)
[facility]   [phone] [phone]   Clinic Follow up Note   Patient Care Team: Eden Lathe as PCP - General (Family Medicine) [name], Deloris Ping, MD as PCP - Cardiology (Cardiology) Antony Blackbird, MD as Consulting Physician (Radiation Oncology) Serena Croissant, MD as Consulting Physician (Hematology and Oncology) Emelia Loron, MD as Consulting Physician (General Surgery) Malachy Mood, MD as Consulting Physician (Oncology)  Date of Service:  06/03/2023  CHIEF COMPLAINT: f/u of GIST and h/o breast cancer   CURRENT THERAPY:  Observation for GIST Tamoxifen for breast cancer   ASSESSMENT:  Anne Horton is a 47 y.o. female with   GIST (gastrointestinal stromal tumor) of small bowel, malignant (HCC) Multifocal GIST of small bowel, pT4N0Mo, stage IIIB, high grade, mitotic rate 9/HPF, pertoneal metastasis in 03/2023, KIT and PDGFRA mutation (-), NF1(+) -Diagnosed in 02/2021 -resection on 02/26/21 under Dr. Donell Beers showed multiple tumor in small bowel, with three small lesions which were unable to be resected. Path confirmed GIST, high grade, margins and lymph nodes were negative. -she began imatinib 400 mg daily on 05/07/21, stopped in 03/2023 due to disease progression.  -repeated CT have not sure evidence of recurrence until 03/08/2023, and showed a large right ovarian mass. -She underwent exploratory laparoscope by Dr. Donell Beers and GYN Dr. Pricilla Holm on 04/09/2023, she underwent resection of her right ovarian mass (path confirmed GIST), BSO and hysterectomy.  Unfortunately she was found to have multiple peritoneal metastasis, biopsy confirmed metastatic GIST. -Due to the significant disease progression to peritoneum, I recommend changing her treatment to Sunitinib, she tried standard dose 50mg  daily for 4 weeks on and 2 weeks off, but did not tolerate well with multiple side effects and stopped after about 3 weeks. -Register is likely related to  NF1 mutation, PDGFRA mutation in her tumor.  We discussed that this tpe of GIST is resistant to imatinib, and probably not responding to other TKI's, although there are a few phase 1/2 studies showed partial response to Sunitinib or Regorafenib.  Given her very low tumor burden and poor tolerance to Sunitinib, will stop and proceed with observation. Surgery will be reserved fore resectable metastasis.  -she would like to f/u Dr. Lin Givens at [facility] for her GIST management in future.   Malignant neoplasm of upper-outer quadrant of left breast in female, estrogen receptor positive (HCC) ER+/PR+/HER2-, stage I (pT1cN0) -Oncotype DX score 11 -s/p lumpectomy in 04/2018 and adjuvant radiation -She is on adjuvant tamoxifen, tolerating well. Plan for 10 years  -most recent mammogram 8/2024was negative. --She has moderate hot flashes, she is on Effexor which she has reduced dose.  She still has gabapentin at home, I encouraged her to try that for hot flashes and related insomnia. -she is s/p BSO in 03/2023 for metastatic GIST -continue Tamoxifen.  -she would like to f/u with me for her breast cancer surveillance     PLAN: --Pt is going to continue her care with Dr. Lin Givens for GIST [facility]  -Continue Tamoxifen -Pt is going continue Breast Care with Me -lab and and f/u in 6 months  SUMMARY OF ONCOLOGIC HISTORY: Oncology History Overview Note  Cancer Staging GIST (gastrointestinal stromal tumor) of small bowel, malignant (HCC) Staging form: Gastrointestinal Stromal Tumor - Small Intestinal, Esophageal, Colorectal, Mesenteric, and Peritoneal GIST, AJCC 8th Edition - Pathologic stage from 04/03/2021: Stage IIIB (pT4, pN0, cM0, Mitotic Rate: High) - Signed by Malachy Mood, MD on 04/23/2021  Malignant neoplasm of upper-outer quadrant of left breast in  female, estrogen receptor positive (HCC) Staging form: Breast, AJCC 8th Edition - Clinical stage from 03/20/2018: Stage IA (cT1c, cN0, cM0, G2, ER+, PR+, HER2-) -  Signed by Loa Socks, NP on 05/27/2018 - Pathologic stage from 05/07/2018: Stage IA (pT1c, pN0, cM0, G1, ER+, PR+, HER2-, Oncotype DX score: 11) - Signed by Loa Socks, NP on 05/27/2018    Malignant neoplasm of upper-outer quadrant of left breast in female, estrogen receptor positive (HCC)  03/20/2018 Initial Diagnosis   Screening detected left breast spiculated mass by ultrasound measured 1.2 cm.  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  This variant was reclassified from a VUS to pathogenic on June 20, 2021.  The Common Hereditary Cancer Panel offered by [facility] includes sequencing and/or deletion duplication testing of the following 47 genes: APC, ATM, AXIN2, BARD1, BMPR1A, BRCA1, BRCA2, BRIP1, CDH1, CDKN2A (p14ARF), CDKN2A (p16INK4a), CKD4, CHEK2, CTNNA1, DICER1, EPCAM (Deletion/duplication testing only), GREM1 (promoter region deletion/duplication testing only), KIT, MEN1, MLH1, MSH2, MSH3, MSH6, MUTYH, NBN, NF1, NHTL1, PALB2, PDGFRA, PMS2, POLD1, POLE, PTEN, RAD50, RAD51C, RAD51D, SDHB, SDHC, SDHD, SMAD4, SMARCA4. STK11, TP53, TSC1, TSC2, and VHL.  The following genes were evaluated for sequence changes only: SDHA and HOXB13 c.251G>A variant only.  Results: No pathogenic variants identified.  A Variant of uncertain significance in the gene NF1 was detected c.2325G>A (Silent). The date of this test report is 04/03/2018.    05/07/2018 Surgery   Left lumpectomy: IDC grade 1, 1.2 cm, intermediate grade DCIS, margins negative,PASH, 0/7 sentinel lymph nodes negative, ER 95%,  PR 95%, Ki-67 1%, HER-2 negative ratio 1.47, T1CN0 stage Ia   05/07/2018 Cancer Staging   Staging form: Breast, AJCC 8th Edition - Pathologic stage from 05/07/2018: Stage IA (pT1c, pN0, cM0, G1, ER+, PR+, HER2-, Oncotype DX score: 11) - Signed by Loa Socks, NP on 05/27/2018   05/25/2018 Oncotype testing   Oncotype DX score 11: 3% risk of distant recurrence of 9 years, low risk   06/24/2018 - 08/03/2018 Radiation Therapy   Adjuvant radiation therapy   09/2018 -  Anti-estrogen oral therapy   Tamoxifen daily   GIST (gastrointestinal stromal tumor) of small bowel, malignant (HCC)  04/03/2021 Cancer Staging   Staging form: Gastrointestinal Stromal Tumor - Small Intestinal, Esophageal, Colorectal, Mesenteric, and Peritoneal GIST, AJCC 8th Edition - Pathologic stage from 04/03/2021: Stage IIIB (pT4, pN0, cM0, Mitotic Rate: High) - Signed by Malachy Mood, MD on 04/23/2021 Stage prefix: Initial diagnosis Histologic grade (G): High grade Histologic grading system: 2 grade system Residual tumor (R): R0 - None   04/23/2021 Initial Diagnosis   GIST (gastrointestinal stromal tumor) of small bowel, malignant (HCC)   05/15/2021 Imaging   CT Chest w/o contrast  IMPRESSION: 1. 6 mm nodular opacity in the posterior right lower lobe with a similar size focus of architectural distortion/scarring in this region on the remote study from 17 years ago, suggesting that this is simply scar although the finding is more confluent on today's study. While almost assuredly benign, consider follow-up CT chest in 6 months to ensure stability. 2. No evidence for metastatic  disease in the chest.   08/21/2021 Imaging   EXAM: CT ABDOMEN AND PELVIS WITH CONTRAST  IMPRESSION: Interval surgical resection of large left abdominal cystic mass since prior exam. No evidence of tumor recurrence or metastatic disease within the abdomen or pelvis.   8 mm low-attenuation lesion in pancreatic head, not definitely seen on  prior exams. Consider further characterization with MRI, or continued surveillance on follow-up CT.   Small posterior uterine fibroid.   08/23/2021 Imaging   EXAM: MRI ABDOMEN WITHOUT AND WITH CONTRAST  IMPRESSION: 1. No evidence of pancreatic mass or suspicious correlate for the CT abnormality, which may have been artifactual or possibly related to heterogeneous fatty replacement. 2. No acute process or evidence of metastatic disease in the abdomen. 3.  Aortic Atherosclerosis (ICD10-I70.0).   04/09/2023 Pathology Results     FINAL MICROSCOPIC DIAGNOSIS:  A. FALLOPIAN TUBE, RIGHT, AND ADJACENT MASS, RESECTION: Fragmented recurrent gastrointestinal stromal tumor (GIST), aggregate tumor size 10.7 cm Ovarian tissue with serosal involvement by GIST - Background uninvolved ovary with normal physiologic changes Benign fallopian tube See comment  B. OVARY AND FALLOPIAN TUBE, LEFT, SALPINGO OOPHORECTOMY: Benign ovary with normal physiologic changes and follicular cyst, 3.0 cm No evidence of tumor involvement Benign fallopian tube  C. LEFT SIDED TUMOR CAPSULE, BIOPSY: Positive for gastrointestinal stromal tumor (GIST)  D. LEFT PELVIS PERITONEAL NODULE, BIOPSY: Positive for gastrointestinal stromal tumor (GIST)  E. RIGHT PELVIC SIDE WALL, BIOPSY: Positive for gastrointestinal stromal tumor (GIST)  F. APPENDIX, INCIDENTAL, APPENDECTOMY: Benign appendix with no significant pathologic change No evidence of tumor involvement  G. UTERUS AND CERVIX, HYSTERECTOMY (121 GRAMS): Inactive pattern endometrium - No evidence of hyperplasia, atypia or malignancy Adenomyosis Leiomyomas Uterine serosal adhesions Cervix with no significant pathologic change No evidence of tumor involvement     Miscellaneous   TEMPUS  GEN OMIC VARIA N TS  Potentially Actionable / Biologically Relevant No reportable pathogenic variants were found.  Tumor / Normal Matched Analysis (Potential  Germline) No normal sample was received, therefore tumor/normal matched analysis was not performed.  Pertinent Negatives  No pathogenic single nucleotide variants, indels, or copy number changes found in: IMMUN OTHERAPY MARKERS  Tumor Mutational Burden  1.35m/mb   12th percentile  Microsatellite Instability Status -Stable      INTERVAL HISTORY:  Anne Horton is here for a follow up of GIST and h/o breast cancer. She was last seen by me on 05/15/2023. She presents to the clinic alone. Pt state that she feels a lot better since discontinuing Sutent.      All other systems were reviewed with the patient and are negative.  MEDICAL HISTORY:  Past Medical History:  Diagnosis Date   Allergy    Anemia    Anginal pain (HCC)    Anxiety    Breast cancer (HCC) 05/07/2018   left invasive ductal    Cancer (HCC)    Chest pain    Depression    Family history of breast cancer    Fibromyalgia    neurofibromatosis   H/O: depression    Hx of migraines    Mitral regurgitation    mild per echo EF 55-60%   Neurofibromatosis (HCC)    Palpitations    Personal history of radiation therapy 08/10/2018   left breast 06/24/2018-08/10/2018  Dr Antony Blackbird   Tachycardia    Hx. of   Wears contact lenses     SURGICAL HISTORY: Past Surgical History:  Procedure Laterality Date   BOWEL RESECTION N/A 04/03/2021  Procedure: SMALL BOWEL RESECTION;  Surgeon: [name], MD;  Location: [facility];  Service: General;  Laterality: N/A;   BREAST BIOPSY     BREAST LUMPECTOMY Left 05/07/2018   Invasive ductal    BREAST LUMPECTOMY WITH RADIOACTIVE SEED AND SENTINEL LYMPH NODE BIOPSY Left 05/07/2018   Procedure: BREAST LUMPECTOMY WITH RADIOACTIVE SEED AND SENTINEL LYMPH NODE BIOPSY;  Surgeon: Emelia Loron, MD;  Location: [facility];  Service: General;  Laterality: Left;   CESAREAN SECTION     CHOLECYSTECTOMY N/A 01/04/2015   Procedure: LAPAROSCOPIC CHOLECYSTECTOMY WITH  INTRAOPERATIVE CHOLANGIOGRAM;  Surgeon: Harriette Bouillon, MD;  Location: [facility];  Service: General;  Laterality: N/A;   COLONOSCOPY     ESOPHAGOGASTRODUODENOSCOPY     LAPAROSCOPY N/A 04/09/2023   Procedure: XI ROBOT ASSISTED DIAGNOSTIC LAPAROSCOPY, ROBOTIC assisted OPEN MASS EXCISION, TOTAL ROBOTIC HYSTERECTOMY WITH BILATERAL SALPINGO OOPHORECTOMY, CYSTOSCOPY;  Surgeon: Carver Fila, MD;  Location: [facility];  Service: Gynecology;  Laterality: N/A;   LAPAROTOMY N/A 04/03/2021   Procedure: EXPLORATORY LAPAROTOMY WITH RESECTION OF ABDOMINAL MASS;  Surgeon: [name], MD;  Location: [facility];  Service: General;  Laterality: N/A;   MASTOPEXY Bilateral 09/05/2020   Procedure: BILATERAL BREAST MASTOPEXY;  Surgeon: Glenna Fellows, MD;  Location:  SURGERY CENTER;  Service: Plastics;  Laterality: Bilateral;   OMENTECTOMY N/A 04/03/2021   Procedure: OMENTECTOMY;  Surgeon: [name], MD;  Location: [facility];  Service: General;  Laterality: N/A;   ROBOTIC ASSISTED LAPAROSCOPIC LYSIS OF ADHESION N/A 04/09/2023   Procedure: XI ROBOTIC ASSISTED REMOVAL OF PELVIC MASS; INCIDENTAL LAPAROSCOPIC APPENDECTOMY;  Surgeon: [name], MD;  Location: [facility];  Service: General;  Laterality: N/A;   WISDOM TOOTH EXTRACTION      I have reviewed the social history and family history with the patient and they are unchanged from previous note.  ALLERGIES:  is allergic to iron sucrose and latex.  MEDICATIONS:  Current Outpatient Medications  Medication Sig Dispense Refill   acetaminophen (TYLENOL) 500 MG tablet Take 1,000 mg by mouth every 6 (six) hours as needed for moderate pain.     ALPRAZolam (XANAX) 0.5 MG tablet Take 1/2 to 1 tablet by mouth 2 times a day as needed 180 tablet 1   amoxicillin (AMOXIL) 500 MG capsule Take 2 capsules now, then 1 capsule 3 times daily until gone. 22 capsule 0   apixaban (ELIQUIS) 2.5 MG TABS tablet Take 1 tablet (2.5 mg) by mouth 2 times daily. 52  tablet 0   CALCIUM PO Take 2 tablets by mouth daily at 6 (six) AM. GUMMY     cetirizine (ZYRTEC) 10 MG tablet Take 10 mg by mouth daily.     cyclobenzaprine (FLEXERIL) 5 MG tablet Take 1 tablet (5 mg total) by mouth 3 (three) times daily as needed for muscle spasms. Do not take and drive 30 tablet 0   ibuprofen (ADVIL) 200 MG tablet Take 800 mg by mouth every 6 (six) hours as needed for moderate pain.     metoprolol succinate (TOPROL-XL) 50 MG 24 hr tablet Take 1 tablet (50 mg total) by mouth daily. 90 tablet 3   Multiple Vitamin (MULTIVITAMIN) tablet Take 1 tablet by mouth daily.     Multiple Vitamins-Minerals (HAIR SKIN & NAILS) TABS Take 2 tablets by mouth daily.     ondansetron (ZOFRAN) 8 MG tablet Take 1 tablet (8 mg) by mouth every 8 hours as needed for nausea or vomiting. 20 tablet 1   potassium chloride (KLOR-CON  M) 10 MEQ tablet Take 1 tablet (10 mEq total) by mouth daily. 7 tablet 0   propranolol (INDERAL) 10 MG tablet TAKE 1 TABLET BY MOUTH FOUR TIMES DAILY AS NEEDED FOR PALPITATIONS 120 tablet 3   sennosides-docusate sodium (SENOKOT-S) 8.6-50 MG tablet Take 1-2 tablets by mouth daily as needed for constipation.     tamoxifen (NOLVADEX) 20 MG tablet TAKE 1 TABLET BY MOUTH ONCE A DAY 90 tablet 3   traMADol (ULTRAM) 50 MG tablet Take 1 tablet (50 mg total) by mouth every 6 (six) hours as needed. 30 tablet 0   venlafaxine XR (EFFEXOR-XR) 75 MG 24 hr capsule Take 75 mg by mouth daily with breakfast.     venlafaxine XR (EFFEXOR-XR) 75 MG 24 hr capsule Take 1 capsule (75 mg) by mouth daily. 90 capsule 1   No current facility-administered medications for this visit.    PHYSICAL EXAMINATION: ECOG PERFORMANCE STATUS: 0 - Asymptomatic  Vitals:   06/03/23 1442  BP: 130/69  Pulse: 81  Resp: 19  Temp: 98.7 F (37.1 C)  SpO2: 99%   Wt Readings from Last 3 Encounters:  06/03/23 132 lb (59.9 kg)  05/15/23 126 lb 4.8 oz (57.3 kg)  04/21/23 129 lb 12.8 oz (58.9 kg)     GENERAL:alert,  no distress and comfortable SKIN: skin color normal, no rashes or significant lesions EYES: normal, Conjunctiva are pink and non-injected, sclera clear  NEURO: alert & oriented x 3 with fluent speech  LABORATORY DATA:  I have reviewed the data as listed    Latest Ref Rng & Units 05/27/2023    1:45 PM 05/15/2023   12:36 PM 04/12/2023    6:02 AM  CBC  WBC 4.0 - 10.5 K/uL 3.8  6.1  11.3   Hemoglobin 12.0 - 15.0 g/dL 69.6  29.5  28.4   Hematocrit 36.0 - 46.0 % 34.3  38.9  31.8   Platelets 150 - 400 K/uL 163  250  335         Latest Ref Rng & Units 05/27/2023    1:45 PM 05/15/2023   12:36 PM 04/12/2023    6:02 AM  CMP  Glucose 70 - 99 mg/dL 132  440  102   BUN 6 - 20 mg/dL 14  16  6    Creatinine 0.44 - 1.00 mg/dL 7.25  3.66  4.40   Sodium 135 - 145 mmol/L 141  141  139   Potassium 3.5 - 5.1 mmol/L 3.3  3.6  3.5   Chloride 98 - 111 mmol/L 105  108  106   CO2 22 - 32 mmol/L 29  25  23    Calcium 8.9 - 10.3 mg/dL 8.9  8.9  8.3   Total Protein 6.5 - 8.1 g/dL 6.3  6.8    Total Bilirubin 0.3 - 1.2 mg/dL 0.5  0.4    Alkaline Phos 38 - 126 U/L 48  45    AST 15 - 41 U/L 25  29    ALT 0 - 44 U/L 23  25        RADIOGRAPHIC STUDIES: I have personally reviewed the radiological images as listed and agreed with the findings in the report. No results found.    No orders of the defined types were placed in this encounter.  All questions were answered. The patient knows to call the clinic with any problems, questions or concerns. No barriers to learning was detected. The total time spent in the appointment was 20 minutes.  Malachy Mood, MD 06/03/2023   Carolin Coy, CMA, am acting as scribe for Malachy Mood, MD.   I have reviewed the above documentation for accuracy and completeness, and I agree with the above.

## 2023-06-03 NOTE — Assessment & Plan Note (Signed)
ER+/PR+/HER2-, stage I (pT1cN0) -Oncotype DX score 11 -s/p lumpectomy in 04/2018 and adjuvant radiation -She is on adjuvant tamoxifen, tolerating well. Plan for 10 years  -most recent mammogram 8/2024was negative. --She has moderate hot flashes, she is on Effexor which she has reduced dose.  She still has gabapentin at home, I encouraged her to try that for hot flashes and related insomnia. -she is s/p BSO in 03/2023 for metastatic GIST -continue Tamoxifen.  -she would like to f/u with me for her breast cancer surveillance

## 2023-06-03 NOTE — Assessment & Plan Note (Addendum)
Multifocal GIST of small bowel, pT4N0Mo, stage IIIB, high grade, mitotic rate 9/HPF, pertoneal metastasis in 03/2023, KIT and PDGFRA mutation (-), NF1(+) -Diagnosed in 02/2021 -resection on 02/26/21 under Dr. Donell Beers showed multiple tumor in small bowel, with three small lesions which were unable to be resected. Path confirmed GIST, high grade, margins and lymph nodes were negative. -she began imatinib 400 mg daily on 05/07/21, stopped in 03/2023 due to disease progression.  -repeated CT have not sure evidence of recurrence until 03/08/2023, and showed a large right ovarian mass. -She underwent exploratory laparoscope by Dr. Donell Beers and GYN Dr. Pricilla Holm on 04/09/2023, she underwent resection of her right ovarian mass (path confirmed GIST), BSO and hysterectomy.  Unfortunately she was found to have multiple peritoneal metastasis, biopsy confirmed metastatic GIST. -Due to the significant disease progression to peritoneum, I recommend changing her treatment to Sunitinib, she tried standard dose 50mg  daily for 4 weeks on and 2 weeks off, but did not tolerate well with multiple side effects and stopped after about 3 weeks. -Register is likely related to NF1 mutation, PDGFRA mutation in her tumor.  We discussed that this tpe of GIST is resistant to imatinib, and probably not responding to other TKI's, although there are a few phase 1/2 studies showed partial response to Sunitinib or Regorafenib.  Given her very low tumor burden and poor tolerance to Sunitinib, will stop and proceed with observation. Surgery will be reserved fore resectable metastasis.  -she would like to f/u Dr. Lin Givens at [facility] for her GIST management in future.

## 2023-06-04 ENCOUNTER — Other Ambulatory Visit: Payer: Self-pay

## 2023-06-05 ENCOUNTER — Telehealth: Payer: Self-pay | Admitting: Hematology

## 2023-06-10 DIAGNOSIS — C49A3 Gastrointestinal stromal tumor of small intestine: Secondary | ICD-10-CM | POA: Diagnosis not present

## 2023-06-19 DIAGNOSIS — C49A3 Gastrointestinal stromal tumor of small intestine: Secondary | ICD-10-CM | POA: Diagnosis not present

## 2023-06-19 DIAGNOSIS — Z90710 Acquired absence of both cervix and uterus: Secondary | ICD-10-CM | POA: Diagnosis not present

## 2023-06-19 DIAGNOSIS — Z9049 Acquired absence of other specified parts of digestive tract: Secondary | ICD-10-CM | POA: Diagnosis not present

## 2023-06-19 DIAGNOSIS — K7689 Other specified diseases of liver: Secondary | ICD-10-CM | POA: Diagnosis not present

## 2023-06-19 DIAGNOSIS — D7389 Other diseases of spleen: Secondary | ICD-10-CM | POA: Diagnosis not present

## 2023-06-19 DIAGNOSIS — K769 Liver disease, unspecified: Secondary | ICD-10-CM | POA: Diagnosis not present

## 2023-06-19 DIAGNOSIS — Z90722 Acquired absence of ovaries, bilateral: Secondary | ICD-10-CM | POA: Diagnosis not present

## 2023-06-19 DIAGNOSIS — Q8500 Neurofibromatosis, unspecified: Secondary | ICD-10-CM | POA: Diagnosis not present

## 2023-06-30 ENCOUNTER — Other Ambulatory Visit: Payer: Self-pay | Admitting: General Surgery

## 2023-06-30 DIAGNOSIS — C49A3 Gastrointestinal stromal tumor of small intestine: Secondary | ICD-10-CM

## 2023-06-30 DIAGNOSIS — C786 Secondary malignant neoplasm of retroperitoneum and peritoneum: Secondary | ICD-10-CM

## 2023-07-03 ENCOUNTER — Other Ambulatory Visit: Payer: Self-pay | Admitting: General Surgery

## 2023-07-03 DIAGNOSIS — C49A3 Gastrointestinal stromal tumor of small intestine: Secondary | ICD-10-CM

## 2023-07-03 DIAGNOSIS — C50412 Malignant neoplasm of upper-outer quadrant of left female breast: Secondary | ICD-10-CM

## 2023-07-03 DIAGNOSIS — C786 Secondary malignant neoplasm of retroperitoneum and peritoneum: Secondary | ICD-10-CM

## 2023-07-04 ENCOUNTER — Other Ambulatory Visit (HOSPITAL_COMMUNITY): Payer: Self-pay

## 2023-07-04 ENCOUNTER — Encounter: Payer: Self-pay | Admitting: General Surgery

## 2023-07-04 DIAGNOSIS — F330 Major depressive disorder, recurrent, mild: Secondary | ICD-10-CM | POA: Diagnosis not present

## 2023-07-04 DIAGNOSIS — F411 Generalized anxiety disorder: Secondary | ICD-10-CM | POA: Diagnosis not present

## 2023-07-04 MED ORDER — ALPRAZOLAM 0.5 MG PO TABS
0.2500 mg | ORAL_TABLET | Freq: Two times a day (BID) | ORAL | 1 refills | Status: DC | PRN
  Filled 2023-07-04 – 2023-07-24 (×2): qty 180, 90d supply, fill #0

## 2023-07-04 MED ORDER — VENLAFAXINE HCL ER 75 MG PO CP24
75.0000 mg | ORAL_CAPSULE | Freq: Every day | ORAL | 1 refills | Status: DC
  Filled 2023-07-04 – 2023-10-16 (×2): qty 90, 90d supply, fill #0

## 2023-07-08 DIAGNOSIS — Z79899 Other long term (current) drug therapy: Secondary | ICD-10-CM | POA: Diagnosis not present

## 2023-07-08 DIAGNOSIS — C49A3 Gastrointestinal stromal tumor of small intestine: Secondary | ICD-10-CM | POA: Diagnosis not present

## 2023-07-16 ENCOUNTER — Ambulatory Visit
Admission: RE | Admit: 2023-07-16 | Discharge: 2023-07-16 | Disposition: A | Discharge status: Home / Self Care | Payer: Commercial Managed Care - PPO | Source: Ambulatory Visit | Attending: General Surgery | Admitting: General Surgery

## 2023-07-16 DIAGNOSIS — C49A3 Gastrointestinal stromal tumor of small intestine: Secondary | ICD-10-CM

## 2023-07-16 DIAGNOSIS — Z853 Personal history of malignant neoplasm of breast: Secondary | ICD-10-CM | POA: Diagnosis not present

## 2023-07-16 DIAGNOSIS — R918 Other nonspecific abnormal finding of lung field: Secondary | ICD-10-CM | POA: Diagnosis not present

## 2023-07-16 DIAGNOSIS — C786 Secondary malignant neoplasm of retroperitoneum and peritoneum: Secondary | ICD-10-CM

## 2023-07-16 DIAGNOSIS — C50412 Malignant neoplasm of upper-outer quadrant of left female breast: Secondary | ICD-10-CM

## 2023-07-16 DIAGNOSIS — I700 Atherosclerosis of aorta: Secondary | ICD-10-CM | POA: Diagnosis not present

## 2023-07-16 MED ORDER — IOPAMIDOL (ISOVUE-370) INJECTION 76%
500.0000 mL | Freq: Once | INTRAVENOUS | Status: AC | PRN
  Administered 2023-07-16: 60 mL via INTRAVENOUS

## 2023-07-17 ENCOUNTER — Other Ambulatory Visit (HOSPITAL_COMMUNITY): Payer: Self-pay

## 2023-07-17 DIAGNOSIS — F439 Reaction to severe stress, unspecified: Secondary | ICD-10-CM | POA: Diagnosis not present

## 2023-07-24 ENCOUNTER — Other Ambulatory Visit: Payer: Self-pay

## 2023-07-24 ENCOUNTER — Other Ambulatory Visit (HOSPITAL_COMMUNITY): Payer: Self-pay

## 2023-07-25 ENCOUNTER — Other Ambulatory Visit: Payer: Self-pay | Admitting: Oncology

## 2023-07-25 ENCOUNTER — Inpatient Hospital Stay
Admission: RE | Admit: 2023-07-25 | Discharge: 2023-07-25 | Disposition: A | Discharge status: Home / Self Care | Payer: Self-pay | Source: Ambulatory Visit | Attending: Gynecologic Oncology | Admitting: Gynecologic Oncology

## 2023-07-25 DIAGNOSIS — C49A3 Gastrointestinal stromal tumor of small intestine: Secondary | ICD-10-CM

## 2023-08-01 DIAGNOSIS — Z170 Estrogen receptor positive status [ER+]: Secondary | ICD-10-CM | POA: Diagnosis not present

## 2023-08-01 DIAGNOSIS — C786 Secondary malignant neoplasm of retroperitoneum and peritoneum: Secondary | ICD-10-CM | POA: Diagnosis not present

## 2023-08-01 DIAGNOSIS — C49A3 Gastrointestinal stromal tumor of small intestine: Secondary | ICD-10-CM | POA: Diagnosis not present

## 2023-08-01 DIAGNOSIS — C50412 Malignant neoplasm of upper-outer quadrant of left female breast: Secondary | ICD-10-CM | POA: Diagnosis not present

## 2023-08-12 ENCOUNTER — Other Ambulatory Visit (HOSPITAL_COMMUNITY): Payer: Self-pay | Admitting: Hematology and Oncology

## 2023-08-12 DIAGNOSIS — C49A3 Gastrointestinal stromal tumor of small intestine: Secondary | ICD-10-CM

## 2023-08-28 ENCOUNTER — Other Ambulatory Visit (HOSPITAL_COMMUNITY): Payer: Self-pay

## 2023-09-16 ENCOUNTER — Ambulatory Visit (HOSPITAL_COMMUNITY)
Admission: RE | Admit: 2023-09-16 | Discharge: 2023-09-16 | Disposition: A | Discharge status: Home / Self Care | Payer: Commercial Managed Care - PPO | Source: Ambulatory Visit | Attending: Hematology and Oncology | Admitting: Hematology and Oncology

## 2023-09-16 DIAGNOSIS — N3289 Other specified disorders of bladder: Secondary | ICD-10-CM | POA: Diagnosis not present

## 2023-09-16 DIAGNOSIS — C49A3 Gastrointestinal stromal tumor of small intestine: Secondary | ICD-10-CM | POA: Diagnosis not present

## 2023-09-16 DIAGNOSIS — R918 Other nonspecific abnormal finding of lung field: Secondary | ICD-10-CM | POA: Diagnosis not present

## 2023-09-16 DIAGNOSIS — Z9049 Acquired absence of other specified parts of digestive tract: Secondary | ICD-10-CM | POA: Diagnosis not present

## 2023-09-16 DIAGNOSIS — R1900 Intra-abdominal and pelvic swelling, mass and lump, unspecified site: Secondary | ICD-10-CM | POA: Diagnosis not present

## 2023-09-16 DIAGNOSIS — K769 Liver disease, unspecified: Secondary | ICD-10-CM | POA: Diagnosis not present

## 2023-09-16 MED ORDER — IOHEXOL 300 MG/ML  SOLN
100.0000 mL | Freq: Once | INTRAMUSCULAR | Status: AC | PRN
  Administered 2023-09-16: 100 mL via INTRAVENOUS

## 2023-09-18 ENCOUNTER — Encounter (HOSPITAL_COMMUNITY): Payer: Self-pay

## 2023-09-18 ENCOUNTER — Ambulatory Visit (HOSPITAL_COMMUNITY): Payer: Commercial Managed Care - PPO

## 2023-09-23 ENCOUNTER — Ambulatory Visit (HOSPITAL_COMMUNITY): Payer: Commercial Managed Care - PPO

## 2023-10-07 DIAGNOSIS — Z8509 Personal history of malignant neoplasm of other digestive organs: Secondary | ICD-10-CM | POA: Diagnosis not present

## 2023-10-07 DIAGNOSIS — C49A3 Gastrointestinal stromal tumor of small intestine: Secondary | ICD-10-CM | POA: Diagnosis not present

## 2023-10-07 DIAGNOSIS — Z08 Encounter for follow-up examination after completed treatment for malignant neoplasm: Secondary | ICD-10-CM | POA: Diagnosis not present

## 2023-10-13 ENCOUNTER — Encounter: Payer: Self-pay | Admitting: Cardiovascular Disease
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
## 2023-10-14 ENCOUNTER — Encounter: Payer: Self-pay | Admitting: Cardiovascular Disease

## 2023-10-14 ENCOUNTER — Other Ambulatory Visit: Payer: Self-pay

## 2023-10-14 ENCOUNTER — Ambulatory Visit: Payer: Commercial Managed Care - PPO | Attending: Cardiovascular Disease | Admitting: Cardiovascular Disease

## 2023-10-14 VITALS — BP 118/78 | HR 85 | Ht 62.0 in | Wt 139.6 lb

## 2023-10-14 DIAGNOSIS — E785 Hyperlipidemia, unspecified: Secondary | ICD-10-CM | POA: Diagnosis not present

## 2023-10-14 DIAGNOSIS — R000 Tachycardia, unspecified: Secondary | ICD-10-CM

## 2023-10-14 NOTE — Patient Instructions (Addendum)
Medication Instructions:  Your physician recommends that you continue on your current medications as directed. Please refer to the Current Medication list given to you today.  *If you need a refill on your cardiac medications before your next appointment, please call your pharmacy*  Lab Work: Your physician recommends that you have lab work today at [facility] for lipid panel, ALT, BMP   If you have labs (blood work) drawn today and your tests are completely normal, you will receive your results only by: MyChart Message (if you have MyChart) OR A paper copy in the mail If you have any lab test that is abnormal or we need to change your treatment, we will call you to review the results.  Testing/Procedures: CT scanning for a cardiac calcium score (CAT scanning), is a noninvasive, special x-ray that produces cross-sectional images of the body using x-rays and a computer. CT scans help physicians diagnose and treat medical conditions. For some CT exams, a contrast material is used to enhance visibility in the area of the body being studied. CT scans provide greater clarity and reveal more details than regular x-ray exams.   Follow-Up: At [facility], you and your health needs are our priority.  As part of our continuing mission to provide you with exceptional heart care, we have created designated Provider Care Teams.  These Care Teams include your primary Cardiologist (physician) and Advanced Practice Providers (APPs -  Physician Assistants and Nurse Practitioners) who all work together to provide you with the care you need, when you need it.  We recommend signing up for the patient portal called "MyChart".  Sign up information is provided on this After Visit Summary.  MyChart is used to connect with patients for Virtual Visits (Telemedicine).  Patients are able to view lab/test results, encounter notes, upcoming appointments, etc.  Non-urgent messages can be sent to your provider as well.    To learn more about what you can do with MyChart, go to [email or web address].    Your next appointment:   3 month(s)  Provider:   Kristeen Miss, MD     Other Instructions

## 2023-10-15 LAB — BASIC METABOLIC PANEL
BUN/Creatinine Ratio: 19 (ref 9–23)
BUN: 12 mg/dL (ref 6–24)
CO2: 24 mmol/L (ref 20–29)
Calcium: 9.4 mg/dL (ref 8.7–10.2)
Chloride: 104 mmol/L (ref 96–106)
Creatinine, Ser: 0.63 mg/dL (ref 0.57–1.00)
Glucose: 97 mg/dL (ref 70–99)
Potassium: 4.1 mmol/L (ref 3.5–5.2)
Sodium: 143 mmol/L (ref 134–144)
eGFR: 108 mL/min/{1.73_m2} (ref >59)

## 2023-10-15 LAB — ALT: ALT: 16 [IU]/L (ref 0–32)

## 2023-10-15 LAB — LIPID PANEL
Chol/HDL Ratio: 4.2 {ratio} (ref 0.0–4.4)
Cholesterol, Total: 194 mg/dL (ref 100–199)
HDL: 46 mg/dL (ref >39)
LDL Chol Calc (NIH): 106 mg/dL — ABNORMAL HIGH (ref 0–99)
Triglycerides: 245 mg/dL — ABNORMAL HIGH (ref 0–149)
VLDL Cholesterol Cal: 42 mg/dL — ABNORMAL HIGH (ref 5–40)

## 2023-10-16 ENCOUNTER — Other Ambulatory Visit (HOSPITAL_COMMUNITY): Payer: Self-pay

## 2023-10-16 ENCOUNTER — Other Ambulatory Visit: Payer: Self-pay

## 2023-10-17 ENCOUNTER — Encounter: Payer: Self-pay | Admitting: Cardiovascular Disease

## 2023-10-22 ENCOUNTER — Other Ambulatory Visit (HOSPITAL_COMMUNITY): Payer: Self-pay | Admitting: Physician Assistant

## 2023-10-22 DIAGNOSIS — C49A3 Gastrointestinal stromal tumor of small intestine: Secondary | ICD-10-CM

## 2023-10-28 ENCOUNTER — Other Ambulatory Visit: Payer: Self-pay | Admitting: Cardiovascular Disease

## 2023-10-28 ENCOUNTER — Inpatient Hospital Stay (HOSPITAL_COMMUNITY): Admission: RE | Admit: 2023-10-28 | Payer: Commercial Managed Care - PPO | Source: Ambulatory Visit

## 2023-10-28 ENCOUNTER — Other Ambulatory Visit (HOSPITAL_COMMUNITY): Payer: Self-pay

## 2023-10-28 MED ORDER — METOPROLOL SUCCINATE ER 50 MG PO TB24
50.0000 mg | ORAL_TABLET | Freq: Every day | ORAL | 3 refills | Status: DC
  Filled 2023-10-28: qty 90, 90d supply, fill #0
  Filled 2024-01-21: qty 90, 90d supply, fill #1

## 2023-11-11 ENCOUNTER — Ambulatory Visit (HOSPITAL_COMMUNITY)
Admission: RE | Admit: 2023-11-11 | Discharge: 2023-11-11 | Disposition: A | Discharge status: Home / Self Care | Payer: Commercial Managed Care - PPO | Source: Ambulatory Visit | Attending: Physician Assistant | Admitting: Physician Assistant

## 2023-11-11 ENCOUNTER — Encounter (HOSPITAL_COMMUNITY): Payer: Self-pay

## 2023-11-11 DIAGNOSIS — Z90710 Acquired absence of both cervix and uterus: Secondary | ICD-10-CM | POA: Diagnosis not present

## 2023-11-11 DIAGNOSIS — K769 Liver disease, unspecified: Secondary | ICD-10-CM | POA: Diagnosis not present

## 2023-11-11 DIAGNOSIS — R1904 Left lower quadrant abdominal swelling, mass and lump: Secondary | ICD-10-CM | POA: Diagnosis not present

## 2023-11-11 DIAGNOSIS — Z9049 Acquired absence of other specified parts of digestive tract: Secondary | ICD-10-CM | POA: Diagnosis not present

## 2023-11-11 DIAGNOSIS — C49A3 Gastrointestinal stromal tumor of small intestine: Secondary | ICD-10-CM | POA: Diagnosis not present

## 2023-11-11 MED ORDER — IOHEXOL 300 MG/ML  SOLN
100.0000 mL | Freq: Once | INTRAMUSCULAR | Status: AC | PRN
  Administered 2023-11-11: 100 mL via INTRAVENOUS

## 2023-11-18 ENCOUNTER — Other Ambulatory Visit: Payer: Self-pay | Admitting: Pharmacy Technician

## 2023-11-18 ENCOUNTER — Other Ambulatory Visit: Payer: Self-pay

## 2023-11-18 ENCOUNTER — Encounter: Payer: Self-pay | Admitting: Hematology and Oncology

## 2023-11-18 DIAGNOSIS — C49A3 Gastrointestinal stromal tumor of small intestine: Secondary | ICD-10-CM | POA: Diagnosis not present

## 2023-11-18 MED ORDER — IMATINIB MESYLATE 400 MG PO TABS
ORAL_TABLET | ORAL | 11 refills | Status: DC
  Filled 2023-11-20: qty 60, 30d supply, fill #0
  Filled 2023-12-11: qty 60, 30d supply, fill #1
  Filled 2024-01-06: qty 60, 30d supply, fill #2

## 2023-11-19 ENCOUNTER — Other Ambulatory Visit (HOSPITAL_COMMUNITY): Payer: Self-pay

## 2023-11-19 ENCOUNTER — Other Ambulatory Visit: Payer: Self-pay

## 2023-11-19 MED ORDER — TAMOXIFEN CITRATE 20 MG PO TABS
ORAL_TABLET | Freq: Every day | ORAL | 3 refills | Status: AC
  Filled 2023-11-19: qty 90, 90d supply, fill #0
  Filled 2024-05-10: qty 90, 90d supply, fill #1
  Filled 2024-07-20 – 2024-08-11 (×2): qty 90, 90d supply, fill #2

## 2023-11-20 ENCOUNTER — Other Ambulatory Visit (HOSPITAL_COMMUNITY): Payer: Self-pay

## 2023-11-20 ENCOUNTER — Other Ambulatory Visit (HOSPITAL_COMMUNITY): Payer: Self-pay | Admitting: Hematology and Oncology

## 2023-11-20 ENCOUNTER — Other Ambulatory Visit: Payer: Self-pay

## 2023-11-20 DIAGNOSIS — C49A3 Gastrointestinal stromal tumor of small intestine: Secondary | ICD-10-CM

## 2023-11-20 NOTE — Progress Notes (Signed)
 Specialty Pharmacy Initial Fill Coordination Note  Anne Horton is a 48 y.o. female contacted today regarding initial fill of specialty medication(s) Imatinib Mesylate (GLEEVEC)   Patient requested Pickup at [facility] at [facility] date: 11/20/23   Medication will be filled on 11/20/23.   Patient is aware of $46.59 copayment.

## 2023-11-20 NOTE — Progress Notes (Signed)
 Specialty Pharmacy Initiation Note   Anne Horton is a 48 y.o. female who will be followed by the specialty pharmacy service for RxSp Oncology    Review of administration, indication, effectiveness, safety, potential side effects, storage/disposable, and missed dose instructions occurred today for patient's specialty medication(s) Imatinib Mesylate (GLEEVEC)     Patient/Caregiver did not have any additional questions or concerns.   Patient's therapy is appropriate to: Initiate    Goals Addressed             This Visit's Progress    Maintain optimal adherence to therapy       Patient is initiating therapy. Patient will maintain adherence        Will follow up in 3 months.   Servando Snare Specialty Pharmacist

## 2023-11-21 ENCOUNTER — Other Ambulatory Visit (HOSPITAL_COMMUNITY): Payer: Self-pay

## 2023-11-21 ENCOUNTER — Encounter: Payer: Self-pay | Admitting: Hematology

## 2023-11-21 MED ORDER — ONDANSETRON HCL 8 MG PO TABS
8.0000 mg | ORAL_TABLET | Freq: Three times a day (TID) | ORAL | 2 refills | Status: DC | PRN
  Filled 2023-11-21: qty 60, 20d supply, fill #0
  Filled 2024-01-13: qty 60, 20d supply, fill #1
  Filled 2024-07-20 (×2): qty 60, 20d supply, fill #2

## 2023-11-22 ENCOUNTER — Telehealth: Payer: Self-pay | Admitting: Hematology

## 2023-11-22 NOTE — Telephone Encounter (Signed)
 Patient is aware of cancelled appointment times/dates

## 2023-11-26 DIAGNOSIS — C49A3 Gastrointestinal stromal tumor of small intestine: Secondary | ICD-10-CM | POA: Diagnosis not present

## 2023-11-28 DIAGNOSIS — F439 Reaction to severe stress, unspecified: Secondary | ICD-10-CM | POA: Diagnosis not present

## 2023-12-03 ENCOUNTER — Other Ambulatory Visit: Payer: Commercial Managed Care - PPO

## 2023-12-03 ENCOUNTER — Ambulatory Visit: Payer: Commercial Managed Care - PPO | Admitting: Hematology

## 2023-12-03 DIAGNOSIS — C499 Malignant neoplasm of connective and soft tissue, unspecified: Secondary | ICD-10-CM | POA: Diagnosis not present

## 2023-12-03 DIAGNOSIS — F439 Reaction to severe stress, unspecified: Secondary | ICD-10-CM | POA: Diagnosis not present

## 2023-12-03 DIAGNOSIS — C49A3 Gastrointestinal stromal tumor of small intestine: Secondary | ICD-10-CM | POA: Diagnosis not present

## 2023-12-09 ENCOUNTER — Ambulatory Visit (HOSPITAL_COMMUNITY)
Admission: RE | Admit: 2023-12-09 | Discharge: 2023-12-09 | Disposition: A | Discharge status: Home / Self Care | Source: Ambulatory Visit | Attending: Hematology and Oncology | Admitting: Hematology and Oncology

## 2023-12-09 ENCOUNTER — Other Ambulatory Visit: Payer: Self-pay

## 2023-12-09 ENCOUNTER — Ambulatory Visit (HOSPITAL_COMMUNITY)
Admission: RE | Admit: 2023-12-09 | Discharge: 2023-12-09 | Disposition: A | Discharge status: Home / Self Care | Payer: Self-pay | Source: Ambulatory Visit | Attending: Cardiovascular Disease | Admitting: Cardiovascular Disease

## 2023-12-09 DIAGNOSIS — R000 Tachycardia, unspecified: Secondary | ICD-10-CM

## 2023-12-09 DIAGNOSIS — E785 Hyperlipidemia, unspecified: Secondary | ICD-10-CM

## 2023-12-09 DIAGNOSIS — C49A3 Gastrointestinal stromal tumor of small intestine: Secondary | ICD-10-CM | POA: Insufficient documentation

## 2023-12-10 ENCOUNTER — Encounter: Payer: Self-pay | Admitting: Cardiovascular Disease

## 2023-12-10 ENCOUNTER — Other Ambulatory Visit (HOSPITAL_COMMUNITY)
Admission: RE | Admit: 2023-12-10 | Discharge: 2023-12-10 | Disposition: A | Discharge status: Home / Self Care | Source: Ambulatory Visit | Attending: Internal Medicine | Admitting: Internal Medicine

## 2023-12-10 DIAGNOSIS — C49A3 Gastrointestinal stromal tumor of small intestine: Secondary | ICD-10-CM | POA: Diagnosis not present

## 2023-12-10 DIAGNOSIS — C499 Malignant neoplasm of connective and soft tissue, unspecified: Secondary | ICD-10-CM | POA: Insufficient documentation

## 2023-12-10 LAB — CBC WITH DIFFERENTIAL/PLATELET
Abs Immature Granulocytes: 0.02 10*3/uL (ref 0.00–0.07)
Basophils Absolute: 0.1 10*3/uL (ref 0.0–0.1)
Basophils Relative: 1 %
Eosinophils Absolute: 0.2 10*3/uL (ref 0.0–0.5)
Eosinophils Relative: 3 %
HCT: 35.9 % — ABNORMAL LOW (ref 36.0–46.0)
Hemoglobin: 11.4 g/dL — ABNORMAL LOW (ref 12.0–15.0)
Immature Granulocytes: 0 %
Lymphocytes Relative: 30 %
Lymphs Abs: 2 10*3/uL (ref 0.7–4.0)
MCH: 28.9 pg (ref 26.0–34.0)
MCHC: 31.8 g/dL (ref 30.0–36.0)
MCV: 91.1 fL (ref 80.0–100.0)
Monocytes Absolute: 0.6 10*3/uL (ref 0.1–1.0)
Monocytes Relative: 10 %
Neutro Abs: 3.6 10*3/uL (ref 1.7–7.7)
Neutrophils Relative %: 56 %
Platelets: 383 10*3/uL (ref 150–400)
RBC: 3.94 MIL/uL (ref 3.87–5.11)
RDW: 13.9 % (ref 11.5–15.5)
WBC: 6.5 10*3/uL (ref 4.0–10.5)
nRBC: 0 % (ref 0.0–0.2)

## 2023-12-10 LAB — COMPREHENSIVE METABOLIC PANEL
ALT: 36 U/L (ref 0–44)
AST: 43 U/L — ABNORMAL HIGH (ref 15–41)
Albumin: 3.6 g/dL (ref 3.5–5.0)
Alkaline Phosphatase: 73 U/L (ref 38–126)
Anion gap: 8 (ref 5–15)
BUN: 12 mg/dL (ref 6–20)
CO2: 26 mmol/L (ref 22–32)
Calcium: 8.6 mg/dL — ABNORMAL LOW (ref 8.9–10.3)
Chloride: 105 mmol/L (ref 98–111)
Creatinine, Ser: 0.8 mg/dL (ref 0.44–1.00)
GFR, Estimated: >60 mL/min (ref >60)
Glucose, Bld: 132 mg/dL — ABNORMAL HIGH (ref 70–99)
Potassium: 3.7 mmol/L (ref 3.5–5.1)
Sodium: 139 mmol/L (ref 135–145)
Total Bilirubin: 0.3 mg/dL (ref 0.0–1.2)
Total Protein: 6.7 g/dL (ref 6.5–8.1)

## 2023-12-11 ENCOUNTER — Other Ambulatory Visit: Payer: Self-pay | Admitting: Pharmacy Technician

## 2023-12-11 ENCOUNTER — Other Ambulatory Visit: Payer: Self-pay

## 2023-12-11 NOTE — Progress Notes (Signed)
 Specialty Pharmacy Refill Coordination Note  Anne Horton is a 48 y.o. female contacted today regarding refills of specialty medication(s)  Imatinib Mesylate (GLEEVEC)    Patient requested (Patient-Rptd) Pickup at [facility] at [facility] date: (Patient-Rptd) 12/17/23   Medication will be filled on 12/16/23.

## 2023-12-16 ENCOUNTER — Other Ambulatory Visit: Payer: Self-pay

## 2023-12-16 DIAGNOSIS — C49A3 Gastrointestinal stromal tumor of small intestine: Secondary | ICD-10-CM | POA: Diagnosis not present

## 2023-12-24 DIAGNOSIS — F439 Reaction to severe stress, unspecified: Secondary | ICD-10-CM | POA: Diagnosis not present

## 2024-01-01 ENCOUNTER — Other Ambulatory Visit (HOSPITAL_COMMUNITY): Payer: Self-pay

## 2024-01-01 DIAGNOSIS — F330 Major depressive disorder, recurrent, mild: Secondary | ICD-10-CM | POA: Diagnosis not present

## 2024-01-01 DIAGNOSIS — F411 Generalized anxiety disorder: Secondary | ICD-10-CM | POA: Diagnosis not present

## 2024-01-01 MED ORDER — VENLAFAXINE HCL ER 75 MG PO CP24
75.0000 mg | ORAL_CAPSULE | Freq: Every day | ORAL | 1 refills | Status: DC
  Filled 2024-01-01: qty 90, 90d supply, fill #0
  Filled 2024-05-10: qty 90, 90d supply, fill #1

## 2024-01-01 MED ORDER — ALPRAZOLAM 0.5 MG PO TABS
0.2500 mg | ORAL_TABLET | Freq: Two times a day (BID) | ORAL | 1 refills | Status: DC
  Filled 2024-01-01: qty 180, 90d supply, fill #0

## 2024-01-05 ENCOUNTER — Other Ambulatory Visit: Payer: Self-pay

## 2024-01-05 DIAGNOSIS — Q8500 Neurofibromatosis, unspecified: Secondary | ICD-10-CM | POA: Diagnosis not present

## 2024-01-05 DIAGNOSIS — Z170 Estrogen receptor positive status [ER+]: Secondary | ICD-10-CM | POA: Diagnosis not present

## 2024-01-05 DIAGNOSIS — C49A3 Gastrointestinal stromal tumor of small intestine: Secondary | ICD-10-CM | POA: Diagnosis not present

## 2024-01-05 DIAGNOSIS — C50412 Malignant neoplasm of upper-outer quadrant of left female breast: Secondary | ICD-10-CM | POA: Diagnosis not present

## 2024-01-05 DIAGNOSIS — C786 Secondary malignant neoplasm of retroperitoneum and peritoneum: Secondary | ICD-10-CM | POA: Diagnosis not present

## 2024-01-06 ENCOUNTER — Other Ambulatory Visit: Payer: Self-pay

## 2024-01-06 ENCOUNTER — Other Ambulatory Visit: Payer: Self-pay | Admitting: Pharmacy Technician

## 2024-01-06 NOTE — Progress Notes (Signed)
 Specialty Pharmacy Refill Coordination Note  Anne Horton is a 48 y.o. female contacted today regarding refills of specialty medication(s) Imatinib  Mesylate (GLEEVEC )   Patient requested (Patient-Rptd) Delivery   Delivery date: (Patient-Rptd) 01/16/24   Verified address: (Patient-Rptd) [address] [place] [address]   Medication will be filled on 01/15/24.

## 2024-01-07 ENCOUNTER — Encounter: Payer: Self-pay | Admitting: General Surgery

## 2024-01-07 ENCOUNTER — Other Ambulatory Visit: Payer: Self-pay | Admitting: General Surgery

## 2024-01-07 DIAGNOSIS — K769 Liver disease, unspecified: Secondary | ICD-10-CM

## 2024-01-07 DIAGNOSIS — C786 Secondary malignant neoplasm of retroperitoneum and peritoneum: Secondary | ICD-10-CM

## 2024-01-07 DIAGNOSIS — C49A3 Gastrointestinal stromal tumor of small intestine: Secondary | ICD-10-CM

## 2024-01-08 ENCOUNTER — Ambulatory Visit
Admission: RE | Admit: 2024-01-08 | Discharge: 2024-01-08 | Disposition: A | Discharge status: Home / Self Care | Source: Ambulatory Visit | Attending: General Surgery | Admitting: General Surgery

## 2024-01-08 DIAGNOSIS — C49A3 Gastrointestinal stromal tumor of small intestine: Secondary | ICD-10-CM

## 2024-01-08 DIAGNOSIS — R935 Abnormal findings on diagnostic imaging of other abdominal regions, including retroperitoneum: Secondary | ICD-10-CM | POA: Diagnosis not present

## 2024-01-08 DIAGNOSIS — K769 Liver disease, unspecified: Secondary | ICD-10-CM

## 2024-01-08 DIAGNOSIS — C786 Secondary malignant neoplasm of retroperitoneum and peritoneum: Secondary | ICD-10-CM

## 2024-01-08 MED ORDER — IOPAMIDOL (ISOVUE-370) INJECTION 76%
100.0000 mL | Freq: Once | INTRAVENOUS | Status: AC | PRN
  Administered 2024-01-08: 80 mL via INTRAVENOUS

## 2024-01-09 ENCOUNTER — Other Ambulatory Visit: Payer: Self-pay | Admitting: General Surgery

## 2024-01-09 DIAGNOSIS — C49A3 Gastrointestinal stromal tumor of small intestine: Secondary | ICD-10-CM

## 2024-01-12 ENCOUNTER — Encounter: Payer: Self-pay | Admitting: Cardiovascular Disease
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
## 2024-01-13 ENCOUNTER — Encounter: Payer: Self-pay | Admitting: Hematology

## 2024-01-13 ENCOUNTER — Ambulatory Visit: Payer: Commercial Managed Care - PPO | Attending: Cardiovascular Disease | Admitting: Cardiovascular Disease

## 2024-01-13 ENCOUNTER — Encounter: Payer: Self-pay | Admitting: Cardiovascular Disease

## 2024-01-13 ENCOUNTER — Other Ambulatory Visit: Payer: Self-pay

## 2024-01-13 VITALS — BP 122/68 | HR 82 | Ht 62.0 in | Wt 140.2 lb

## 2024-01-13 DIAGNOSIS — R0789 Other chest pain: Secondary | ICD-10-CM | POA: Diagnosis not present

## 2024-01-13 DIAGNOSIS — F439 Reaction to severe stress, unspecified: Secondary | ICD-10-CM | POA: Diagnosis not present

## 2024-01-13 DIAGNOSIS — R000 Tachycardia, unspecified: Secondary | ICD-10-CM | POA: Diagnosis not present

## 2024-01-13 DIAGNOSIS — Z9081 Acquired absence of spleen: Secondary | ICD-10-CM

## 2024-01-13 DIAGNOSIS — Z170 Estrogen receptor positive status [ER+]: Secondary | ICD-10-CM

## 2024-01-13 DIAGNOSIS — C49A3 Gastrointestinal stromal tumor of small intestine: Secondary | ICD-10-CM

## 2024-01-13 NOTE — Patient Instructions (Signed)
 Follow-Up: At [facility], you and your health needs are our priority.  As part of our continuing mission to provide you with exceptional heart care, our providers are all part of one team.  This team includes your primary Cardiologist (physician) and Advanced Practice Providers or APPs (Physician Assistants and Nurse Practitioners) who all work together to provide you with the care you need, when you need it.  Your next appointment:   1 year(s)  Provider:   Ahmad Alert, MD

## 2024-01-14 ENCOUNTER — Inpatient Hospital Stay: Attending: Internal Medicine

## 2024-01-14 DIAGNOSIS — Z1732 Human epidermal growth factor receptor 2 negative status: Secondary | ICD-10-CM | POA: Diagnosis not present

## 2024-01-14 DIAGNOSIS — C50412 Malignant neoplasm of upper-outer quadrant of left female breast: Secondary | ICD-10-CM | POA: Insufficient documentation

## 2024-01-14 DIAGNOSIS — C49A0 Gastrointestinal stromal tumor, unspecified site: Secondary | ICD-10-CM | POA: Insufficient documentation

## 2024-01-14 DIAGNOSIS — Z170 Estrogen receptor positive status [ER+]: Secondary | ICD-10-CM | POA: Diagnosis not present

## 2024-01-14 DIAGNOSIS — Z1721 Progesterone receptor positive status: Secondary | ICD-10-CM | POA: Insufficient documentation

## 2024-01-14 DIAGNOSIS — Z23 Encounter for immunization: Secondary | ICD-10-CM | POA: Insufficient documentation

## 2024-01-14 DIAGNOSIS — C49A3 Gastrointestinal stromal tumor of small intestine: Secondary | ICD-10-CM

## 2024-01-14 DIAGNOSIS — Z9081 Acquired absence of spleen: Secondary | ICD-10-CM

## 2024-01-14 DIAGNOSIS — Z79899 Other long term (current) drug therapy: Secondary | ICD-10-CM | POA: Diagnosis not present

## 2024-01-14 MED ORDER — HAEMOPHILUS B POLYSAC CONJ VAC IM SOLR
0.5000 mL | Freq: Once | INTRAMUSCULAR | Status: AC
  Administered 2024-01-14: 0.5 mL via INTRAMUSCULAR
  Filled 2024-01-14: qty 0.5

## 2024-01-14 MED ORDER — MENINGOCOCCAL VAC B (OMV) IM SUSY
0.5000 mL | PREFILLED_SYRINGE | Freq: Once | INTRAMUSCULAR | Status: AC
  Administered 2024-01-14: 0.5 mL via INTRAMUSCULAR
  Filled 2024-01-14: qty 0.5

## 2024-01-14 MED ORDER — MENINGOCOCCAL A C Y&W-135 OLIG IM SOLR
0.5000 mL | Freq: Once | INTRAMUSCULAR | Status: AC
  Administered 2024-01-14: 0.5 mL via INTRAMUSCULAR
  Filled 2024-01-14: qty 0.5

## 2024-01-14 MED ORDER — MENINGOCOCCAL A C Y&W-135 OLIG IM SOLN
0.5000 mL | Freq: Once | INTRAMUSCULAR | Status: DC
  Filled 2024-01-14: qty 0.5

## 2024-01-14 MED ORDER — PNEUMOCOCCAL 20-VAL CONJ VACC 0.5 ML IM SUSY
0.5000 mL | PREFILLED_SYRINGE | INTRAMUSCULAR | Status: AC
  Administered 2024-01-14: 0.5 mL via INTRAMUSCULAR
  Filled 2024-01-14: qty 0.5

## 2024-01-14 NOTE — Patient Instructions (Addendum)
 Pneumococcal Conjugate Vaccine (PCV20) Injection What is this medication? PNEUMOCOCCAL CONJUGATE VACCINE (NEU mo KOK al kon ju gate vak SEEN) reduces the risk of pneumococcal disease, such as pneumonia. It does not treat pneumococcal disease. It is still possible to get pneumococcal disease after receiving this vaccine, but the symptoms may be less severe or not last as long. It works by helping your immune system learn how to fight off a future infection. This medicine may be used for other purposes; ask your health care provider or pharmacist if you have questions. COMMON BRAND NAME(S): Prevnar 20 What should I tell my care team before I take this medication? They need to know if you have any of these conditions: Bleeding disorder Fever Immune system problems An unusual or allergic reaction to pneumococcal vaccine, diphtheria toxoid, other vaccines, other medications, foods, dyes, or preservatives Pregnant or trying to get pregnant Breastfeeding How should I use this medication? This vaccine is injected into a muscle. It is given by your care team. A copy of Vaccine Information Statements will be given before each vaccination. Be sure to read this information carefully each time. This sheet may change often. Talk to your care team about the use of this medication in children. While it may be given to children as young as 6 weeks for selected conditions, precautions do apply. Overdosage: If you think you have taken too much of this medicine contact a poison control center or emergency room at once. NOTE: This medicine is only for you. Do not share this medicine with others. What if I miss a dose? This does not apply. This medication is not for regular use. What may interact with this medication? Medications for cancer chemotherapy Medications that suppress your immune function Steroid medications, such as prednisone or cortisone This list may not describe all possible interactions. Give  your health care provider a list of all the medicines, herbs, non-prescription drugs, or dietary supplements you use. Also tell them if you smoke, drink alcohol, or use illegal drugs. Some items may interact with your medicine. What should I watch for while using this medication? Visit your care team regularly. Report any side effects to your care team right away. This vaccine, like all vaccines, may not fully protect everyone. What side effects may I notice from receiving this medication? Side effects that you should report to your care team as soon as possible: Allergic reactions--skin rash, itching, hives, swelling of the face, lips, tongue, or throat Side effects that usually do not require medical attention (report these to your care team if they continue or are bothersome): Fatigue Fever Headache Joint pain Muscle pain Pain, redness, or irritation at injection site This list may not describe all possible side effects. Call your doctor for medical advice about side effects. You may report side effects to FDA at 1-800-FDA-1088. Where should I keep my medication? This vaccine is only given by your care team. It will not be stored at home. NOTE: This sheet is a summary. It may not cover all possible information. If you have questions about this medicine, talk to your doctor, pharmacist, or health care provider Haemophilus influenzae type b (Hib) Vaccine Injection What is this medication? HAEMOPHILUS INFLUENZAE TYPE B VACCINE (hem OFF fil us  in floo EN zuh type B vak SEEN) reduces the risk of Haemophilus influenzae type b (Hib). It does not treat Hib. It is still possible to get Hib after receiving this vaccine, but the symptoms may be less severe or not last  as long. It works by helping your immune system learn how to fight off a future infection. This medicine may be used for other purposes; ask your health care provider or pharmacist if you have questions. COMMON BRAND NAME(S): ActHIB,  HIBERIX, HibTITER, PedvaxHIB What should I tell my care team before I take this medication? They need to know if you have any of these conditions: Bleeding disorder Guillain-Barre syndrome Immune system problems Infection, fever Low levels of platelets in the blood Take medications that treat or prevent blood clots An unusual or allergic reaction to vaccines, other medications, foods, dyes, or preservatives Pregnant or trying to get pregnant Breastfeeding How should I use this medication? This vaccine is injected into a muscle. It is given by your care team. A copy of Vaccine Information Statements will be given before each vaccination. Be sure to read this sheet carefully each time. This sheet may change often. Talk to your care team about the use of this medication in children. While it may be prescribed for children as young as 2 months old for selected conditions, precautions do apply. Overdosage: If you think you have taken too much of this medicine contact a poison control center or emergency room at once. NOTE: This medicine is only for you. Do not share this medicine with others. What if I miss a dose? Keep appointments for follow-up doses as directed. It is important not to miss your dose. Call your care team if you are unable to keep an appointment. What may interact with this medication? Adalimumab Anakinra Certain medications that prevent or treat blood clots, such as warfarin, enoxaparin , dalteparin Certain medications that suppress your immune system Certain medications to treat cancer Infliximab This list may not describe all possible interactions. Give your health care provider a list of all the medicines, herbs, non-prescription drugs, or dietary supplements you use. Also tell them if you smoke, drink alcohol, or use illegal drugs. Some items may interact with your medicine. What should I watch for while using this medication? See your care team for all shots of this  vaccine as directed. Tell your care team right away if you have any serious or unusual side effects after getting this vaccine. What side effects may I notice from receiving this medication? Side effects that you should report to your care team as soon as possible: Allergic reactions--skin rash, itching, hives, swelling of the face, lips, tongue, or throat Side effects that usually do not require medical attention (report these to your care team if they continue or are bothersome): Diarrhea Fatigue Fever Irritability Pain, redness, or irritation at injection site Vomiting This list may not describe all possible side effects. Call your doctor for medical advice about side effects. You may report side effects to FDA at 1-800-FDA-1088. Where should I keep my medication? This vaccine is only given by your care team. It will not be stored at home. NOTE: This sheet is a summary. It may not cover all possible information. If you have questions about this medicine, talk to your doctor, pharmacist, or health care provider.  2024 Elsevier/Gold Standard (2022-02-12 00:00:00) Meningococcal ACWY Vaccine Injection What is this medication? MENINGOCOCCAL ACWY VACCINE (muh nin jeh KOK kul ACWY vak SEEN), or MENINGOCOCCAL CONJUGATE VACCINE (muh nin jeh KOK kul KON juh geyt vak SEEN), reduces the risk of meningitis. It does not treat meningitis. It is still possible to get meningitis after receiving this vaccine, but the symptoms may be less severe or not last as long. It  works by helping your immune system learn how to fight off a future infection. This medicine may be used for other purposes; ask your health care provider or pharmacist if you have questions. COMMON BRAND NAME(S): Menactra, MenQuadfi, Menveo What should I tell my care team before I take this medication? They need to know if you have any of these conditions: Bleeding disorder Fever or infection History of Guillain-Barre syndrome Immune  system problems An unusual or allergic reaction to diphtheria toxoid, meningococcal vaccine, latex, other vaccines, other medications, foods, dyes, or preservatives Pregnant or trying to get pregnant Breastfeeding How should I use this medication? This medication is injected into a muscle. It is given by your care team. A copy of Vaccine Information Statements will be given before each vaccination. Be sure to read this information carefully each time. This sheet may change often. Talk to your care team about the use of this medication in children. While it may be prescribed for children as young as 9 months for selected conditions, precautions do apply. Overdosage: If you think you have taken too much of this medicine contact a poison control center or emergency room at once. NOTE: This medicine is only for you. Do not share this medicine with others. What if I miss a dose? This does not apply. What may interact with this medication? Adalimumab Anakinra Certain medications for arthritis Infliximab Medications for organ transplant Medications to treat cancer Medications used during some procedures to diagnose a medical condition Other vaccines Steroid medications, such as prednisone or cortisone This list may not describe all possible interactions. Give your health care provider a list of all the medicines, herbs, non-prescription drugs, or dietary supplements you use. Also tell them if you smoke, drink alcohol, or use illegal drugs. Some items may interact with your medicine. What should I watch for while using this medication? Report any side effects to your care team right away. Call your care team if you have any unusual symptoms within 6 weeks of getting this vaccine. This vaccine may not protect from all meningitis infections. Talk to your care team if you may be pregnant. What side effects may I notice from receiving this medication? Side effects that you should report to your care  team as soon as possible: Allergic reactions--skin rash, itching, hives, swelling of the face, lips, tongue, or throat Feeling faint or lightheaded Side effects that usually do not require medical attention (report these to your care team if they continue or are bothersome): Diarrhea General discomfort and fatigue Headache Irritability Muscle pain Pain, redness, or irritation at injection site This list may not describe all possible side effects. Call your doctor for medical advice about side effects. You may report side effects to FDA at 1-800-FDA-1088. Where should I keep my medication? This vaccine is only given by your care team. It will not be stored at home. NOTE: This sheet is a summary. It may not cover all possible information. If you have questions about this medicine, talk to your doctor, pharmacist, or health care provider.  2024 Elsevier/Gold Standard (2022-02-12 00:00:00) Meningococcal B (MenB-4C) Vaccine Injection What is this medication? MENINGOCOCCAL B VACCINE (muh nin jeh KOK kul B vak SEEN) reduces the risk of meningitis. It does not treat meningitis. It is still possible to get meningitis after receiving this vaccine, but the symptoms may be less severe or not last as long. It works by helping your immune system learn how to fight off a future infection. This medicine may be  used for other purposes; ask your health care provider or pharmacist if you have questions. COMMON BRAND NAME(S): BEXSERO What should I tell my care team before I take this medication? They need to know if you have any of these conditions: Bleeding disorder Fever or infection Immune system problems An unusual or allergic reaction to meningococcal vaccine, other vaccines, other medications, foods, dyes, or preservatives Pregnant or trying to get pregnant Breastfeeding How should I use this medication? This vaccine is injected into a muscle. It is given by your care team. This vaccine requires 2  or 3 doses to get the full benefit. Set a reminder for when your next dose is due. A copy of Vaccine Information Statements will be given before each vaccination. Be sure to read this information carefully each time. This sheet may change often. Talk to your care team to see which vaccines are right for you. Some vaccines should not be used in all age groups. Overdosage: If you think you have taken too much of this medicine contact a poison control center or emergency room at once. NOTE: This medicine is only for you. Do not share this medicine with others. What if I miss a dose? Keep appointments for follow-up doses as directed. It is important not to miss your dose. Call your care team if you are unable to keep an appointment. What may interact with this medication? Medications that lower your chance of fighting an infection Other vaccines This list may not describe all possible interactions. Give your health care provider a list of all the medicines, herbs, non-prescription drugs, or dietary supplements you use. Also tell them if you smoke, drink alcohol, or use illegal drugs. Some items may interact with your medicine. What should I watch for while using this medication? Visit your care team for regular health checks. Before you receive this vaccine, talk to your care team if you have an acute illness. Vaccines can be given to people with mild acute illness, such as the common cold or diarrhea. Discuss with your care team the risks and benefits of receiving this vaccine during a moderate to severe illness. Your care team may choose to wait to give you the vaccine when you feel better. Report any side effects to your care team or to the Vaccine Adverse Event Reporting System (VAERS) website at https://vaers.LAgents.no. This is only for reporting side effects; VAERS staff do not give medical advice. What side effects may I notice from receiving this medication? Side effects that you should report to  your care team as soon as possible: Allergic reactions--skin rash, itching, hives, swelling of the face, lips, tongue, or throat Feeling faint or lightheaded Side effects that usually do not require medical attention (report these to your care team if they continue or are bothersome): Fatigue Headache Joint pain Muscle pain Nausea Pain, redness, or irritation at injection site This list may not describe all possible side effects. Call your doctor for medical advice about side effects. You may report side effects to FDA at 1-800-FDA-1088. Where should I keep my medication? This vaccine is only given by your care team. It will not be stored at home. NOTE: This sheet is a summary. It may not cover all possible information. If you have questions about this medicine, talk to your doctor, pharmacist, or health care provider.  2024 Elsevier/Gold Standard (2023-08-15 00:00:00)

## 2024-01-15 ENCOUNTER — Other Ambulatory Visit (HOSPITAL_COMMUNITY): Payer: Self-pay

## 2024-01-15 ENCOUNTER — Other Ambulatory Visit: Payer: Self-pay

## 2024-01-15 NOTE — Progress Notes (Signed)
 Left voicemail. Need payment information.

## 2024-01-16 ENCOUNTER — Other Ambulatory Visit: Payer: Self-pay

## 2024-01-16 DIAGNOSIS — C49A3 Gastrointestinal stromal tumor of small intestine: Secondary | ICD-10-CM

## 2024-01-16 NOTE — Assessment & Plan Note (Signed)
ER+/PR+/HER2-, stage I (pT1cN0) -Oncotype DX score 11 -s/p lumpectomy in 04/2018 and adjuvant radiation -She is on adjuvant tamoxifen, tolerating well. Plan for 10 years  -most recent mammogram 8/2024was negative. --She has moderate hot flashes, she is on Effexor which she has reduced dose.  She still has gabapentin at home, I encouraged her to try that for hot flashes and related insomnia. -she is s/p BSO in 03/2023 for metastatic GIST -continue Tamoxifen.  -she would like to f/u with me for her breast cancer surveillance

## 2024-01-16 NOTE — Assessment & Plan Note (Signed)
 Multifocal GIST of small bowel, pT4N0Mo, stage IIIB, high grade, mitotic rate 9/HPF, pertoneal metastasis in 03/2023, KIT and PDGFRA mutation (-), NF1(+) -Diagnosed in 02/2021 -resection on 02/26/21 under Dr. Cherlynn Cornfield showed multiple tumor in small bowel, with three small lesions which were unable to be resected. Path confirmed GIST, high grade, margins and lymph nodes were negative. -she began imatinib  400 mg daily on 05/07/21, stopped in 03/2023 due to disease progression.  -repeated CT have not sure evidence of recurrence until 03/08/2023, and showed a large right ovarian mass. -She underwent exploratory laparoscope by Dr. Cherlynn Cornfield and GYN Dr. Orvil Bland on 04/09/2023, she underwent resection of her right ovarian mass (path confirmed GIST), BSO and hysterectomy.  Unfortunately she was found to have multiple peritoneal metastasis, biopsy confirmed metastatic GIST. -Due to the significant disease progression to peritoneum, I recommend changing her treatment to Sunitinib, she tried standard dose 50mg  daily for 4 weeks on and 2 weeks off, but did not tolerate well with multiple side effects and stopped after about 3 weeks. -Register is likely related to NF1 mutation, PDGFRA mutation in her tumor.  We discussed that this tpe of GIST is resistant to imatinib , and probably not responding to other TKI's, although there are a few phase 1/2 studies showed partial response to Sunitinib or Regorafenib.  Given her very low tumor burden and poor tolerance to Sunitinib, will stop and proceed with observation. Surgery will be reserved fore resectable metastasis.  -she has bene under the care of Dr. Francisco Irving at [facility] for her GIST management since 04/2023

## 2024-01-19 ENCOUNTER — Inpatient Hospital Stay: Attending: Internal Medicine

## 2024-01-19 ENCOUNTER — Other Ambulatory Visit: Payer: Self-pay

## 2024-01-19 ENCOUNTER — Inpatient Hospital Stay (HOSPITAL_BASED_OUTPATIENT_CLINIC_OR_DEPARTMENT_OTHER): Admitting: Hematology

## 2024-01-19 VITALS — BP 120/78 | HR 85 | Temp 98.2°F | Resp 17 | Wt 139.8 lb

## 2024-01-19 DIAGNOSIS — Z79899 Other long term (current) drug therapy: Secondary | ICD-10-CM | POA: Insufficient documentation

## 2024-01-19 DIAGNOSIS — N736 Female pelvic peritoneal adhesions (postinfective): Secondary | ICD-10-CM | POA: Insufficient documentation

## 2024-01-19 DIAGNOSIS — Z90710 Acquired absence of both cervix and uterus: Secondary | ICD-10-CM | POA: Insufficient documentation

## 2024-01-19 DIAGNOSIS — D259 Leiomyoma of uterus, unspecified: Secondary | ICD-10-CM | POA: Insufficient documentation

## 2024-01-19 DIAGNOSIS — Z79810 Long term (current) use of selective estrogen receptor modulators (SERMs): Secondary | ICD-10-CM | POA: Diagnosis not present

## 2024-01-19 DIAGNOSIS — C49A3 Gastrointestinal stromal tumor of small intestine: Secondary | ICD-10-CM | POA: Insufficient documentation

## 2024-01-19 DIAGNOSIS — F32A Depression, unspecified: Secondary | ICD-10-CM | POA: Diagnosis not present

## 2024-01-19 DIAGNOSIS — G4700 Insomnia, unspecified: Secondary | ICD-10-CM | POA: Insufficient documentation

## 2024-01-19 DIAGNOSIS — Z90722 Acquired absence of ovaries, bilateral: Secondary | ICD-10-CM | POA: Insufficient documentation

## 2024-01-19 DIAGNOSIS — D649 Anemia, unspecified: Secondary | ICD-10-CM | POA: Insufficient documentation

## 2024-01-19 DIAGNOSIS — Z170 Estrogen receptor positive status [ER+]: Secondary | ICD-10-CM | POA: Insufficient documentation

## 2024-01-19 DIAGNOSIS — C50412 Malignant neoplasm of upper-outer quadrant of left female breast: Secondary | ICD-10-CM

## 2024-01-19 DIAGNOSIS — Z923 Personal history of irradiation: Secondary | ICD-10-CM | POA: Insufficient documentation

## 2024-01-19 DIAGNOSIS — Z9049 Acquired absence of other specified parts of digestive tract: Secondary | ICD-10-CM | POA: Insufficient documentation

## 2024-01-19 DIAGNOSIS — Z1732 Human epidermal growth factor receptor 2 negative status: Secondary | ICD-10-CM | POA: Insufficient documentation

## 2024-01-19 DIAGNOSIS — C786 Secondary malignant neoplasm of retroperitoneum and peritoneum: Secondary | ICD-10-CM | POA: Diagnosis not present

## 2024-01-19 DIAGNOSIS — Z1721 Progesterone receptor positive status: Secondary | ICD-10-CM | POA: Diagnosis not present

## 2024-01-19 DIAGNOSIS — N8003 Adenomyosis of the uterus: Secondary | ICD-10-CM | POA: Diagnosis not present

## 2024-01-19 DIAGNOSIS — Z1231 Encounter for screening mammogram for malignant neoplasm of breast: Secondary | ICD-10-CM

## 2024-01-19 DIAGNOSIS — I700 Atherosclerosis of aorta: Secondary | ICD-10-CM | POA: Insufficient documentation

## 2024-01-19 LAB — CBC WITH DIFFERENTIAL (CANCER CENTER ONLY)
Abs Immature Granulocytes: 0.01 10*3/uL (ref 0.00–0.07)
Basophils Absolute: 0.1 10*3/uL (ref 0.0–0.1)
Basophils Relative: 1 %
Eosinophils Absolute: 0.2 10*3/uL (ref 0.0–0.5)
Eosinophils Relative: 4 %
HCT: 32 % — ABNORMAL LOW (ref 36.0–46.0)
Hemoglobin: 10.6 g/dL — ABNORMAL LOW (ref 12.0–15.0)
Immature Granulocytes: 0 %
Lymphocytes Relative: 21 %
Lymphs Abs: 1.3 10*3/uL (ref 0.7–4.0)
MCH: 29.7 pg (ref 26.0–34.0)
MCHC: 33.1 g/dL (ref 30.0–36.0)
MCV: 89.6 fL (ref 80.0–100.0)
Monocytes Absolute: 0.5 10*3/uL (ref 0.1–1.0)
Monocytes Relative: 8 %
Neutro Abs: 4 10*3/uL (ref 1.7–7.7)
Neutrophils Relative %: 66 %
Platelet Count: 362 10*3/uL (ref 150–400)
RBC: 3.57 MIL/uL — ABNORMAL LOW (ref 3.87–5.11)
RDW: 15.9 % — ABNORMAL HIGH (ref 11.5–15.5)
WBC Count: 6 10*3/uL (ref 4.0–10.5)
nRBC: 0 % (ref 0.0–0.2)

## 2024-01-19 LAB — CMP (CANCER CENTER ONLY)
ALT: 20 U/L (ref 0–44)
AST: 26 U/L (ref 15–41)
Albumin: 3.8 g/dL (ref 3.5–5.0)
Alkaline Phosphatase: 84 U/L (ref 38–126)
Anion gap: 7 (ref 5–15)
BUN: 10 mg/dL (ref 6–20)
CO2: 26 mmol/L (ref 22–32)
Calcium: 8.3 mg/dL — ABNORMAL LOW (ref 8.9–10.3)
Chloride: 109 mmol/L (ref 98–111)
Creatinine: 0.76 mg/dL (ref 0.44–1.00)
GFR, Estimated: >60 mL/min (ref >60)
Glucose, Bld: 119 mg/dL — ABNORMAL HIGH (ref 70–99)
Potassium: 3.9 mmol/L (ref 3.5–5.1)
Sodium: 142 mmol/L (ref 135–145)
Total Bilirubin: 0.4 mg/dL (ref 0.0–1.2)
Total Protein: 6.3 g/dL — ABNORMAL LOW (ref 6.5–8.1)

## 2024-01-19 LAB — FERRITIN: Ferritin: 148 ng/mL (ref 11–307)

## 2024-01-19 LAB — TSH: TSH: 1.14 u[IU]/mL (ref 0.350–4.500)

## 2024-01-19 NOTE — Pre-Procedure Instructions (Signed)
 Surgical Instructions   Your procedure is scheduled on Jan 29, 2024. Report to [facility] Main Entrance "A" at 6:30 A.M., then check in with the Admitting office. Any questions or running late day of surgery: call [phone]  Questions prior to your surgery date: call [phone], Monday-Friday, 8am-4pm. If you experience any cold or flu symptoms such as cough, fever, chills, shortness of breath, etc. between now and your scheduled surgery, please notify us  at the above number.     Remember:  Do not eat after midnight the night before your surgery   You may drink clear liquids until 5:30 AM the morning of your surgery.   Clear liquids allowed are: Water , Non-Citrus Juices (without pulp), Carbonated Beverages, Clear Tea (no milk, honey, etc.), Black Coffee Only (NO MILK, CREAM OR POWDERED CREAMER of any kind), and Gatorade.    Take these medicines the morning of surgery with A SIP OF WATER : cetirizine (ZYRTEC)  metoprolol  succinate (TOPROL -XL)  venlafaxine  XR (EFFEXOR -XR)  tamoxifen  (NOLVADEX )    May take these medicines IF NEEDED: acetaminophen  (TYLENOL )  ALPRAZolam  (XANAX )  ondansetron  (ZOFRAN )  propranolol  (INDERAL )    One week prior to surgery, STOP taking any Aspirin (unless otherwise instructed by your surgeon) Aleve, Naproxen, Ibuprofen , Motrin , Advil , Goody's, BC's, all herbal medications, fish oil, and non-prescription vitamins.                     Do NOT Smoke (Tobacco/Vaping) for 24 hours prior to your procedure.  If you use a CPAP at night, you may bring your mask/headgear for your overnight stay.   You will be asked to remove any contacts, glasses, piercing's, hearing aid's, dentures/partials prior to surgery. Please bring cases for these items if needed.    Patients discharged the day of surgery will not be allowed to drive home, and someone needs to stay with them for 24 hours.  SURGICAL WAITING ROOM VISITATION Patients may have no more than 2 support  people in the waiting area - these visitors may rotate.   Pre-op nurse will coordinate an appropriate time for 1 ADULT support person, who may not rotate, to accompany patient in pre-op.  Children under the age of 77 must have an adult with them who is not the patient and must remain in the main waiting area with an adult.  If the patient needs to stay at the hospital during part of their recovery, the visitor guidelines for inpatient rooms apply.  Please refer to the [facility] website for the visitor guidelines for any additional information.   If you received a COVID test during your pre-op visit  it is requested that you wear a mask when out in public, stay away from anyone that may not be feeling well and notify your surgeon if you develop symptoms. If you have been in contact with anyone that has tested positive in the last 10 days please notify you surgeon.      Pre-operative CHG Bathing Instructions   You can play a key role in reducing the risk of infection after surgery. Your skin needs to be as free of germs as possible. You can reduce the number of germs on your skin by washing with CHG (chlorhexidine  gluconate) soap before surgery. CHG is an antiseptic soap that kills germs and continues to kill germs even after washing.   DO NOT use if you have an allergy to chlorhexidine /CHG or antibacterial soaps. If your skin becomes reddened or irritated, stop using the CHG  and notify one of our RNs at [phone].              TAKE A SHOWER THE NIGHT BEFORE SURGERY AND THE DAY OF SURGERY    Please keep in mind the following:  DO NOT shave, including legs and underarms, 48 hours prior to surgery.   You may shave your face before/day of surgery.  Place clean sheets on your bed the night before surgery Use a clean washcloth (not used since being washed) for each shower. DO NOT sleep with pet's night before surgery.  CHG Shower Instructions:  Wash your face and private area with normal  soap. If you choose to wash your hair, wash first with your normal shampoo.  After you use shampoo/soap, rinse your hair and body thoroughly to remove shampoo/soap residue.  Turn the water  OFF and apply half the bottle of CHG soap to a CLEAN washcloth.  Apply CHG soap ONLY FROM YOUR NECK DOWN TO YOUR TOES (washing for 3-5 minutes)  DO NOT use CHG soap on face, private areas, open wounds, or sores.  Pay special attention to the area where your surgery is being performed.  If you are having back surgery, having someone wash your back for you may be helpful. Wait 2 minutes after CHG soap is applied, then you may rinse off the CHG soap.  Pat dry with a clean towel  Put on clean pajamas    Additional instructions for the day of surgery: DO NOT APPLY any lotions, deodorants, cologne, or perfumes.   Do not wear jewelry or makeup Do not wear nail polish, gel polish, artificial nails, or any other type of covering on natural nails (fingers and toes) Do not bring valuables to the hospital. [facility] is not responsible for valuables/personal belongings. Put on clean/comfortable clothes.  Please brush your teeth.  Ask your nurse before applying any prescription medications to the skin.

## 2024-01-19 NOTE — Progress Notes (Signed)
 [facility]   [phone] [phone]   Clinic Follow up Note   Patient Care Team: [name], OT as PCP - General [name], [name], MD as PCP - Cardiology (Cardiology) [name], MD as Consulting Physician (Radiation Oncology) [name], MD as Consulting Physician (Hematology and Oncology) [name], MD as Consulting Physician (General Surgery) [name], MD as Consulting Physician (Oncology)  Date of Service:  01/19/2024  CHIEF COMPLAINT: f/u of breast cancer   CURRENT THERAPY:  Adjuvant Tamoxifen    Oncology History   Malignant neoplasm of upper-outer quadrant of left breast in female, estrogen receptor positive (HCC) ER+/PR+/HER2-, stage I (pT1cN0) -Oncotype DX score 11 -s/p lumpectomy in 04/2018 and adjuvant radiation -She is on adjuvant tamoxifen , tolerating well. Plan for 10 years  -most recent mammogram 8/2024was negative. --She has moderate hot flashes, she is on Effexor  which she has reduced dose.  She still has gabapentin  at home, I encouraged her to try that for hot flashes and related insomnia. -she is s/p BSO in 03/2023 for metastatic GIST -continue Tamoxifen .  -she would like to f/u with me for her breast cancer surveillance   GIST (gastrointestinal stromal tumor) of small bowel, malignant (HCC) Multifocal GIST of small bowel, pT4N0Mo, stage IIIB, high grade, mitotic rate 9/HPF, pertoneal metastasis in 03/2023, KIT and PDGFRA mutation (-), NF1(+) -Diagnosed in 02/2021 -resection on 02/26/21 under Dr. Cherlynn Cornfield showed multiple tumor in small bowel, with three small lesions which were unable to be resected. Path confirmed GIST, high grade, margins and lymph nodes were negative. -she began imatinib  400 mg daily on 05/07/21, stopped in 03/2023 due to disease progression.  -repeated CT have not sure evidence of recurrence until 03/08/2023, and showed a large right ovarian mass. -She underwent exploratory laparoscope by Dr.  Cherlynn Cornfield and GYN Dr. Orvil Bland on 04/09/2023, she underwent resection of her right ovarian mass (path confirmed GIST), BSO and hysterectomy.  Unfortunately she was found to have multiple peritoneal metastasis, biopsy confirmed metastatic GIST. -Due to the significant disease progression to peritoneum, I recommend changing her treatment to Sunitinib, she tried standard dose 50mg  daily for 4 weeks on and 2 weeks off, but did not tolerate well with multiple side effects and stopped after about 3 weeks. -Register is likely related to NF1 mutation, PDGFRA mutation in her tumor.  We discussed that this tpe of GIST is resistant to imatinib , and probably not responding to other TKI's, although there are a few phase 1/2 studies showed partial response to Sunitinib or Regorafenib.  Given her very low tumor burden and poor tolerance to Sunitinib, will stop and proceed with observation. Surgery will be reserved fore resectable metastasis.  -she has bene under the care of Dr. Francisco Irving at [facility] for her GIST management since 04/2023   Assessment & Plan History of breast cancer - On adjuvant tamoxifen , tolerating well. -Last mammogram was not unremarkable in August 2024.  Due to dense breast tissue, I recommended contrast-enhanced mammogram, next due in August 2025 - Will hold her tamoxifen  now due to upcoming abdominal surgery for metastatic GIST tumor, she knows to restart when she is back to 80% of normal activity.  Anemia  Mild anemia with hemoglobin at 10.6 g/dL, likely secondary to Gleevec .  - Recent B12 and folate were normal, will check iron  level   Plan - Hold tamoxifen  for upcoming abdominal surgery, and restart after surgery when she is back to 80% of normal activity - I ordered a screening contrast-enhanced mammogram to  be scheduled for August 2025 - Lab and follow-up in 6 months     SUMMARY OF ONCOLOGIC HISTORY: Oncology History Overview Note  Cancer Staging GIST (gastrointestinal stromal tumor) of  small bowel, malignant (HCC) Staging form: Gastrointestinal Stromal Tumor - Small Intestinal, Esophageal, Colorectal, Mesenteric, and Peritoneal GIST, AJCC 8th Edition - Pathologic stage from 04/03/2021: Stage IIIB (pT4, pN0, cM0, Mitotic Rate: High) - Signed by Sonja [place], MD on 04/23/2021  Malignant neoplasm of upper-outer quadrant of left breast in female, estrogen receptor positive (HCC) Staging form: Breast, AJCC 8th Edition - Clinical stage from 03/20/2018: Stage IA (cT1c, cN0, cM0, G2, ER+, PR+, HER2-) - Signed by Percival Brace, NP on 05/27/2018 - Pathologic stage from 05/07/2018: Stage IA (pT1c, pN0, cM0, G1, ER+, PR+, HER2-, Oncotype DX score: 11) - Signed by Percival Brace, NP on 05/27/2018    Malignant neoplasm of upper-outer quadrant of left breast in female, estrogen receptor positive (HCC)  03/20/2018 Initial Diagnosis   Screening detected left breast spiculated mass by ultrasound measured 1.2 cm.  Biopsy revealed invasive ductal carcinoma grade 1-2 with high-grade DCIS, ER 95%, PR 95%, Ki-67 1%, HER-2 negative ratio 1.47, T1CN0 stage I a clinical stage   03/20/2018 Cancer Staging   Staging form: Breast, AJCC 8th Edition - Clinical stage from 03/20/2018: Stage IA (cT1c, cN0, cM0, G2, ER+, PR+, HER2-) - Signed by Percival Brace, NP on 05/27/2018   04/08/2018 Genetic Testing   NF1  c.2325G>A (Silent) pathogenic variant was identified.  This variant was reclassified from a VUS to pathogenic on June 20, 2021.  The Common Hereditary Cancer Panel offered by [facility] includes sequencing and/or deletion duplication testing of the following 47 genes: APC, ATM, AXIN2, BARD1, BMPR1A, BRCA1, BRCA2, BRIP1, CDH1, CDKN2A (p14ARF), CDKN2A (p16INK4a), CKD4, CHEK2, CTNNA1, DICER1, EPCAM (Deletion/duplication testing only), GREM1 (promoter region deletion/duplication testing only), KIT, MEN1, MLH1, MSH2, MSH3, MSH6, MUTYH, NBN, NF1, NHTL1, PALB2, PDGFRA, PMS2, POLD1, POLE, PTEN, RAD50,  RAD51C, RAD51D, SDHB, SDHC, SDHD, SMAD4, SMARCA4. STK11, TP53, TSC1, TSC2, and VHL.  The following genes were evaluated for sequence changes only: SDHA and HOXB13 c.251G>A variant only.  Results: No pathogenic variants identified.  A Variant of uncertain significance in the gene NF1 was detected c.2325G>A (Silent). The date of this test report is 04/03/2018.    05/07/2018 Surgery   Left lumpectomy: IDC grade 1, 1.2 cm, intermediate grade DCIS, margins negative,PASH, 0/7 sentinel lymph nodes negative, ER 95%, PR 95%, Ki-67 1%, HER-2 negative ratio 1.47, T1CN0 stage Ia   05/07/2018 Cancer Staging   Staging form: Breast, AJCC 8th Edition - Pathologic stage from 05/07/2018: Stage IA (pT1c, pN0, cM0, G1, ER+, PR+, HER2-, Oncotype DX score: 11) - Signed by Percival Brace, NP on 05/27/2018   05/25/2018 Oncotype testing   Oncotype DX score 11: 3% risk of distant recurrence of 9 years, low risk   06/24/2018 - 08/03/2018 Radiation Therapy   Adjuvant radiation therapy   09/2018 -  Anti-estrogen oral therapy   Tamoxifen  daily   GIST (gastrointestinal stromal tumor) of small bowel, malignant (HCC)  04/03/2021 Cancer Staging   Staging form: Gastrointestinal Stromal Tumor - Small Intestinal, Esophageal, Colorectal, Mesenteric, and Peritoneal GIST, AJCC 8th Edition - Pathologic stage from 04/03/2021: Stage IIIB (pT4, pN0, cM0, Mitotic Rate: High) - Signed by Sonja [place], MD on 04/23/2021 Stage prefix: Initial diagnosis Histologic grade (G): High grade Histologic grading system: 2 grade system Residual tumor (R): R0 - None   04/23/2021 Initial Diagnosis   GIST (  gastrointestinal stromal tumor) of small bowel, malignant (HCC)   05/15/2021 Imaging   CT Chest w/o contrast  IMPRESSION: 1. 6 mm nodular opacity in the posterior right lower lobe with a similar size focus of architectural distortion/scarring in this region on the remote study from 17 years ago, suggesting that this is simply scar although the  finding is more confluent on today's study. While almost assuredly benign, consider follow-up CT chest in 6 months to ensure stability. 2. No evidence for metastatic disease in the chest.   08/21/2021 Imaging   EXAM: CT ABDOMEN AND PELVIS WITH CONTRAST  IMPRESSION: Interval surgical resection of large left abdominal cystic mass since prior exam. No evidence of tumor recurrence or metastatic disease within the abdomen or pelvis.   8 mm low-attenuation lesion in pancreatic head, not definitely seen on prior exams. Consider further characterization with MRI, or continued surveillance on follow-up CT.   Small posterior uterine fibroid.   08/23/2021 Imaging   EXAM: MRI ABDOMEN WITHOUT AND WITH CONTRAST  IMPRESSION: 1. No evidence of pancreatic mass or suspicious correlate for the CT abnormality, which may have been artifactual or possibly related to heterogeneous fatty replacement. 2. No acute process or evidence of metastatic disease in the abdomen. 3.  Aortic Atherosclerosis (ICD10-I70.0).   04/09/2023 Pathology Results     FINAL MICROSCOPIC DIAGNOSIS:  A. FALLOPIAN TUBE, RIGHT, AND ADJACENT MASS, RESECTION: Fragmented recurrent gastrointestinal stromal tumor (GIST), aggregate tumor size 10.7 cm Ovarian tissue with serosal involvement by GIST - Background uninvolved ovary with normal physiologic changes Benign fallopian tube See comment  B. OVARY AND FALLOPIAN TUBE, LEFT, SALPINGO OOPHORECTOMY: Benign ovary with normal physiologic changes and follicular cyst, 3.0 cm No evidence of tumor involvement Benign fallopian tube  C. LEFT SIDED TUMOR CAPSULE, BIOPSY: Positive for gastrointestinal stromal tumor (GIST)  D. LEFT PELVIS PERITONEAL NODULE, BIOPSY: Positive for gastrointestinal stromal tumor (GIST)  E. RIGHT PELVIC SIDE WALL, BIOPSY: Positive for gastrointestinal stromal tumor (GIST)  F. APPENDIX, INCIDENTAL, APPENDECTOMY: Benign appendix with no significant  pathologic change No evidence of tumor involvement  G. UTERUS AND CERVIX, HYSTERECTOMY (121 GRAMS): Inactive pattern endometrium - No evidence of hyperplasia, atypia or malignancy Adenomyosis Leiomyomas Uterine serosal adhesions Cervix with no significant pathologic change No evidence of tumor involvement     Miscellaneous   TEMPUS  GEN OMIC VARIA N TS  Potentially Actionable / Biologically Relevant No reportable pathogenic variants were found.  Tumor / Normal Matched Analysis (Potential Germline) No normal sample was received, therefore tumor/normal matched analysis was not performed.  Pertinent Negatives  No pathogenic single nucleotide variants, indels, or copy number changes found in: IMMUN OTHERAPY MARKERS  Tumor Mutational Burden  1.44m/mb   12th percentile  Microsatellite Instability Status -Stable      Discussed the use of AI scribe software for clinical note transcription with the patient, who gave verbal consent to proceed.  History of Present Illness Anne Horton "Moira Andrews" is a 48 year old female with breast cancer and metastatic chest disease who presents for follow-up.  She is scheduled for surgery on the fifteenth, which may involve the liver, spleen, and possibly the stomach. A CT scan was performed two weeks ago, but the results have not been discussed. She experiences pain under her ribs, possibly related to the liver, which has decreased recently. She is not using pain medication currently.  She stopped taking tamoxifen  and Gleevec  due to the upcoming surgery. She was taking Gleevec  at a dose of 800 mg daily  without issues. She is currently anemic with a hemoglobin of 10.6. Blood counts, kidney, and liver functions are normal.  There are no current concerns about her breast, and her last mammogram was in August. She does not have periods due to a hysterectomy.     All other systems were reviewed with the patient and are negative.  MEDICAL  HISTORY:  Past Medical History:  Diagnosis Date   Allergy    Anemia    Anginal pain (HCC)    Anxiety    Breast cancer (HCC) 05/07/2018   left invasive ductal    Chest pain    Depression    Family history of breast cancer    Fibromyalgia    neurofibromatosis   gist 2022   recurrence 2024   H/O: depression    Hx of migraines    Mitral regurgitation    mild per echo EF 55-60%   Neurofibromatosis (HCC)    Palpitations    Personal history of radiation therapy 08/10/2018   left breast 06/24/2018-08/10/2018  Dr [name]   Tachycardia    Hx. of   Wears contact lenses     SURGICAL HISTORY: Past Surgical History:  Procedure Laterality Date   BOWEL RESECTION N/A 04/03/2021   Procedure: SMALL BOWEL RESECTION;  Surgeon: Lockie Rima, MD;  Location: [facility];  Service: General;  Laterality: N/A;   BREAST BIOPSY     BREAST LUMPECTOMY Left 05/07/2018   Invasive ductal    BREAST LUMPECTOMY WITH RADIOACTIVE SEED AND SENTINEL LYMPH NODE BIOPSY Left 05/07/2018   Procedure: BREAST LUMPECTOMY WITH RADIOACTIVE SEED AND SENTINEL LYMPH NODE BIOPSY;  Surgeon: [name], MD;  Location: [facility];  Service: General;  Laterality: Left;   CESAREAN SECTION     CHOLECYSTECTOMY N/A 01/04/2015   Procedure: LAPAROSCOPIC CHOLECYSTECTOMY WITH INTRAOPERATIVE CHOLANGIOGRAM;  Surgeon: Sim Dryer, MD;  Location: [facility];  Service: General;  Laterality: N/A;   COLONOSCOPY     ESOPHAGOGASTRODUODENOSCOPY     LAPAROSCOPY N/A 04/09/2023   Procedure: XI ROBOT ASSISTED DIAGNOSTIC LAPAROSCOPY, ROBOTIC assisted OPEN MASS EXCISION, TOTAL ROBOTIC HYSTERECTOMY WITH BILATERAL SALPINGO OOPHORECTOMY, CYSTOSCOPY;  Surgeon: Suzi Essex, MD;  Location: [facility];  Service: Gynecology;  Laterality: N/A;   LAPAROTOMY N/A 04/03/2021   Procedure: EXPLORATORY LAPAROTOMY WITH RESECTION OF ABDOMINAL MASS;  Surgeon: Lockie Rima, MD;  Location: [facility];  Service: General;  Laterality: N/A;    MASTOPEXY Bilateral 09/05/2020   Procedure: BILATERAL BREAST MASTOPEXY;  Surgeon: Alger Infield, MD;  Location:  SURGERY C[facility];  Service: Plastics;  Laterality: Bilateral;   OMENTECTOMY N/A 04/03/2021   Procedure: OMENTECTOMY;  Surgeon: Lockie Rima, MD;  Location: [facility];  Service: General;  Laterality: N/A;   ROBOTIC ASSISTED LAPAROSCOPIC LYSIS OF ADHESION N/A 04/09/2023   Procedure: XI ROBOTIC ASSISTED REMOVAL OF PELVIC MASS; INCIDENTAL LAPAROSCOPIC APPENDECTOMY;  Surgeon: Lockie Rima, MD;  Location: [facility];  Service: General;  Laterality: N/A;   WISDOM TOOTH EXTRACTION      I have reviewed the social history and family history with the patient and they are unchanged from previous note.  ALLERGIES:  is allergic to iron  sucrose, other, and latex.  MEDICATIONS:  Current Outpatient Medications  Medication Sig Dispense Refill   acetaminophen  (TYLENOL ) 500 MG tablet Take 1,000 mg by mouth every 6 (six) hours as needed for moderate pain (pain score 4-6).     ALPRAZolam  (XANAX ) 0.5 MG tablet Take 1/2 to 1 tablet by mouth 2 times a day as  needed 180 tablet 1   ALPRAZolam  (XANAX ) 0.5 MG tablet Take 1/2 to 1 tablet by mouth 2 times a day as needed (Patient not taking: Reported on 01/16/2024) 180 tablet 1   CALCIUM PO Take 2 tablets by mouth daily at 6 (six) AM. GUMMY     cetirizine (ZYRTEC) 10 MG tablet Take 10 mg by mouth daily.     ibuprofen  (ADVIL ) 200 MG tablet Take 800 mg by mouth every 6 (six) hours as needed for moderate pain (pain score 4-6).     imatinib  (GLEEVEC ) 400 MG tablet Take 1 tablet (400 mg total) by mouth 2 (two) times a day. Take with a large glass of water . You may dissolve in water  or apple juice to make swallowing easier.  Should be taken with a meal. Do not take on an empty stomach. 60 tablet 11   metoprolol  succinate (TOPROL -XL) 50 MG 24 hr tablet Take 1 tablet (50 mg total) by mouth daily. 90 tablet 3   Multiple Vitamin (MULTIVITAMIN) tablet Take 2 tablets  by mouth daily.     Multiple Vitamins-Minerals (HAIR SKIN & NAILS) TABS Take 2 tablets by mouth daily.     ondansetron  (ZOFRAN ) 8 MG tablet Take 1 tablet (8 mg) by mouth every 8 hours as needed for nausea or vomiting. (Patient not taking: Reported on 01/16/2024) 20 tablet 1   ondansetron  (ZOFRAN ) 8 MG tablet Take 1 tablet (8 mg total) by mouth every 8 (eight) hours as needed for nausea or vomiting. 60 tablet 2   propranolol  (INDERAL ) 10 MG tablet TAKE 1 TABLET BY MOUTH FOUR TIMES DAILY AS NEEDED FOR PALPITATIONS 120 tablet 3   sennosides-docusate sodium  (SENOKOT-S) 8.6-50 MG tablet Take 1 tablet by mouth 2 (two) times daily.     tamoxifen  (NOLVADEX ) 20 MG tablet TAKE 1 TABLET BY MOUTH ONCE A DAY 90 tablet 3   venlafaxine  XR (EFFEXOR -XR) 75 MG 24 hr capsule Take 1 capsule (75 mg total) by mouth daily. (Patient not taking: Reported on 01/16/2024) 90 capsule 1   venlafaxine  XR (EFFEXOR -XR) 75 MG 24 hr capsule Take 1 capsule (75 mg total) by mouth daily. 90 capsule 1   No current facility-administered medications for this visit.    PHYSICAL EXAMINATION: ECOG PERFORMANCE STATUS: 1 - Symptomatic but completely ambulatory  Vitals:   01/19/24 0811  BP: 120/78  Pulse: 85  Resp: 17  Temp: 98.2 F (36.8 C)  SpO2: 99%   Wt Readings from Last 3 Encounters:  01/19/24 139 lb 12.8 oz (63.4 kg)  01/13/24 140 lb 3.2 oz (63.6 kg)  10/14/23 139 lb 9.6 oz (63.3 kg)     GENERAL:alert, no distress and comfortable SKIN: skin color, texture, turgor are normal, no rashes or significant lesions EYES: normal, Conjunctiva are pink and non-injected, sclera clear NECK: supple, thyroid  normal size, non-tender, without nodularity LYMPH:  no palpable lymphadenopathy in the cervical, axillary  LUNGS: clear to auscultation and percussion with normal breathing effort HEART: regular rate & rhythm and no murmurs and no lower extremity edema ABDOMEN:abdomen soft, non-tender and normal bowel sounds Musculoskeletal:no  cyanosis of digits and no clubbing  NEURO: alert & oriented x 3 with fluent speech, no focal motor/sensory deficits BREAST: Right breast normal. Left breast with scar tissue and firmness post-radiation, no abnormal masses or findings besides scar tissue. Physical Exam   LABORATORY DATA:  I have reviewed the data as listed    Latest Ref Rng & Units 01/19/2024    7:48 AM 12/10/2023  3:26 PM 05/27/2023    1:45 PM  CBC  WBC 4.0 - 10.5 K/uL 6.0  6.5  3.8   Hemoglobin 12.0 - 15.0 g/dL 40.9  81.1  91.4   Hematocrit 36.0 - 46.0 % 32.0  35.9  34.3   Platelets 150 - 400 K/uL 362  383  163         Latest Ref Rng & Units 01/19/2024    7:48 AM 12/10/2023    3:26 PM 10/14/2023    2:28 PM  CMP  Glucose 70 - 99 mg/dL 782  956  97   BUN 6 - 20 mg/dL 10  12  12    Creatinine 0.44 - 1.00 mg/dL 2.13  0.86  5.78   Sodium 135 - 145 mmol/L 142  139  143   Potassium 3.5 - 5.1 mmol/L 3.9  3.7  4.1   Chloride 98 - 111 mmol/L 109  105  104   CO2 22 - 32 mmol/L 26  26  24    Calcium 8.9 - 10.3 mg/dL 8.3  8.6  9.4   Total Protein 6.5 - 8.1 g/dL 6.3  6.7    Total Bilirubin 0.0 - 1.2 mg/dL 0.4  0.3    Alkaline Phos 38 - 126 U/L 84  73    AST 15 - 41 U/L 26  43    ALT 0 - 44 U/L 20  36  16       RADIOGRAPHIC STUDIES: I have personally reviewed the radiological images as listed and agreed with the findings in the report. No results found.    Orders Placed This Encounter  Procedures   MM Digital Screening    Contrast enhanced MM    Standing Status:   Future    Expected Date:   05/10/2024    Expiration Date:   01/18/2025    Reason for Exam (SYMPTOM  OR DIAGNOSIS REQUIRED):   screening    Is the patient pregnant?:   No    Preferred [facility]   Ferritin    ADD TO LAB draw today    Standing Status:   Future    Expected Date:   01/19/2024    Expiration Date:   01/18/2025   CBC with Differential/Platelet    Standing Status:   Standing    Number of Occurrences:   50     Expiration Date:   01/18/2025   Comprehensive metabolic panel with GFR    Standing Status:   Standing    Number of Occurrences:   50    Expiration Date:   01/18/2025   All questions were answered. The patient knows to call the clinic with any problems, questions or concerns. No barriers to learning was detected. The total time spent in the appointment was 25 minutes.     Sonja Vienna, MD 01/19/2024

## 2024-01-20 ENCOUNTER — Other Ambulatory Visit: Payer: Self-pay

## 2024-01-20 ENCOUNTER — Encounter (HOSPITAL_COMMUNITY): Payer: Self-pay

## 2024-01-20 ENCOUNTER — Encounter (HOSPITAL_COMMUNITY)
Admission: RE | Admit: 2024-01-20 | Discharge: 2024-01-20 | Disposition: A | Discharge status: Home / Self Care | Source: Ambulatory Visit | Attending: General Surgery | Admitting: General Surgery

## 2024-01-20 DIAGNOSIS — Z01818 Encounter for other preprocedural examination: Secondary | ICD-10-CM | POA: Diagnosis not present

## 2024-01-20 DIAGNOSIS — C49A3 Gastrointestinal stromal tumor of small intestine: Secondary | ICD-10-CM | POA: Diagnosis not present

## 2024-01-20 HISTORY — DX: Cardiac arrhythmia, unspecified: I49.9

## 2024-01-20 NOTE — Progress Notes (Addendum)
 PCP - Jodean Mullet, PA with [facility] Cardiologist - Dr. Ahmad Alert - last office visit 01/13/2024  PPM/ICD - Denies Device Orders - n/a Rep Notified - n/a  Chest x-ray - n/a EKG - 10/14/2023 Stress Test - Denies ECHO - 04/30/2010 Cardiac Cath - Denies  Sleep Study - Denies CPAP - n/a  No DM  Last dose of GLP1 agonist- n/a GLP1 instructions: n/a  Blood Thinner Instructions: n/a Aspirin Instructions: n/a  ERAS Protcol - Clear liquids until 0530 morning of surgery PRE-SURGERY Ensure or G2- n/a  COVID TEST- n/a   Anesthesia review: Yes. Cardiac risk assessment noted in last office visit note. Hx of palpitations, neurofibromatosis, breast CA.  Patient denies shortness of breath, fever, cough and chest pain at PAT appointment. Pt denies any respiratory illness/infection in the last two months.   All instructions explained to the patient, with a verbal understanding of the material. Patient agrees to go over the instructions while at home for a better understanding. Patient also instructed to self quarantine after being tested for COVID-19. The opportunity to ask questions was provided.

## 2024-01-21 ENCOUNTER — Other Ambulatory Visit: Payer: Self-pay

## 2024-01-21 ENCOUNTER — Other Ambulatory Visit (HOSPITAL_COMMUNITY): Payer: Self-pay

## 2024-01-21 NOTE — Progress Notes (Signed)
 Anesthesia Chart Review:  48 year old female follows with cardiology for history of HLD, palpitations, sinus tachycardia.  Last seen by Dr. Alroy Aspen on 01/13/2024, noted to be doing well at that time from cardiac standpoint, sinus tachycardia well-controlled on metoprolol  50 mg daily.  Recent coronary calcium score 12/11/2023 was 0.  Upcoming surgery was also discussed.  Per note, "She needs to have more abdominal surgery for several GIST tumors. She is at low risk for her surgery."  Other pertinent history includes iron  deficiency anemia, left breast cancer s/p lumpectomy 2019 with adjuvant radiation, neurofibromatosis, metastatic GIST s/p small bowel resection 03/2021 and hysterectomy/BSO 03/2023.  Preop labs reviewed, mild anemia Hgb 10.6, otherwise unremarkable.   10/14/2023: NSR.  Rate 80.     Anne Horton [facility] Short Stay Center/Anesthesiology Phone [phone] 01/21/2024 1:42 PM

## 2024-01-21 NOTE — Anesthesia Preprocedure Evaluation (Addendum)
 Anesthesia Evaluation  Patient identified by MRN, date of birth, ID band Patient awake    Reviewed: Allergy & Precautions, NPO status , Patient's Chart, lab work & pertinent test results, reviewed documented beta blocker date and time   History of Anesthesia Complications Negative for: history of anesthetic complications  Airway Mallampati: II  TM Distance: >3 FB Neck ROM: Full    Dental no notable dental hx.    Pulmonary neg pulmonary ROS   Pulmonary exam normal        Cardiovascular Pt. on home beta blockers Normal cardiovascular exam+ dysrhythmias (sinus tachycardia)      Neuro/Psych   Anxiety Depression    Neurofibromatosis    GI/Hepatic Neg liver ROS,,,METASTATIC GI STROMAL TUMOR   Endo/Other  negative endocrine ROS    Renal/GU negative Renal ROS     Musculoskeletal   Abdominal   Peds  Hematology  (+) Blood dyscrasia (Hgb 10.6), anemia   Anesthesia Other Findings Day of surgery medications reviewed with patient.  Reproductive/Obstetrics                              Anesthesia Physical Anesthesia Plan  ASA: 2  Anesthesia Plan: General   Post-op Pain Management: Regional block* and Tylenol  PO (pre-op)*   Induction: Intravenous  PONV Risk Score and Plan: 4 or greater and Treatment may vary due to age or medical condition, Ondansetron , Dexamethasone , Midazolam  and Scopolamine  patch - Pre-op  Airway Management Planned: Oral ETT  Additional Equipment: Arterial line  Intra-op Plan:   Post-operative Plan: Possible Post-op intubation/ventilation  Informed Consent: I have reviewed the patients History and Physical, chart, labs and discussed the procedure including the risks, benefits and alternatives for the proposed anesthesia with the patient or authorized representative who has indicated his/her understanding and acceptance.     Dental advisory given  Plan Discussed with:  CRNA  Anesthesia Plan Comments: (PIV x2 (at least 18g, will need CVC if no adequate peripheral access), thoracic epidural, arterial line   )         Anesthesia Quick Evaluation

## 2024-01-22 ENCOUNTER — Other Ambulatory Visit: Payer: Self-pay

## 2024-01-22 DIAGNOSIS — Z1231 Encounter for screening mammogram for malignant neoplasm of breast: Secondary | ICD-10-CM

## 2024-01-22 NOTE — Progress Notes (Signed)
 Faxed order for Contrast Enhanced Mamm[facility] to [facility] as per Dr. Sonja Southern Ute. Received confirmation of fax received.

## 2024-01-23 ENCOUNTER — Encounter: Payer: Self-pay | Admitting: Hematology and Oncology

## 2024-01-23 NOTE — Progress Notes (Signed)
 The proposed treatment discussed in conference is for discussion purpose only and is not a binding recommendation.  The patients have not been physically examined, or presented with their treatment options.  Therefore, final treatment plans cannot be decided.

## 2024-01-26 ENCOUNTER — Other Ambulatory Visit: Payer: Self-pay | Admitting: General Surgery

## 2024-01-26 DIAGNOSIS — Q8500 Neurofibromatosis, unspecified: Secondary | ICD-10-CM | POA: Diagnosis not present

## 2024-01-26 DIAGNOSIS — C786 Secondary malignant neoplasm of retroperitoneum and peritoneum: Secondary | ICD-10-CM | POA: Diagnosis not present

## 2024-01-26 DIAGNOSIS — C787 Secondary malignant neoplasm of liver and intrahepatic bile duct: Secondary | ICD-10-CM | POA: Diagnosis not present

## 2024-01-26 DIAGNOSIS — C7889 Secondary malignant neoplasm of other digestive organs: Secondary | ICD-10-CM | POA: Diagnosis not present

## 2024-01-26 DIAGNOSIS — C49A3 Gastrointestinal stromal tumor of small intestine: Secondary | ICD-10-CM | POA: Diagnosis not present

## 2024-01-29 ENCOUNTER — Other Ambulatory Visit: Payer: Self-pay

## 2024-01-29 ENCOUNTER — Encounter (HOSPITAL_COMMUNITY): Payer: Self-pay | Admitting: General Surgery

## 2024-01-29 ENCOUNTER — Inpatient Hospital Stay (HOSPITAL_COMMUNITY)

## 2024-01-29 ENCOUNTER — Encounter (HOSPITAL_COMMUNITY)
Admission: RE | Disposition: A | Discharge status: Home Health | Payer: Self-pay | Source: Home / Self Care | Attending: General Surgery

## 2024-01-29 ENCOUNTER — Inpatient Hospital Stay (HOSPITAL_COMMUNITY)
Admission: RE | Admit: 2024-01-29 | Discharge: 2024-02-19 | DRG: 327 | Disposition: A | Discharge status: Home Health | Attending: General Surgery | Admitting: General Surgery

## 2024-01-29 ENCOUNTER — Inpatient Hospital Stay (HOSPITAL_COMMUNITY): Payer: Self-pay | Admitting: Anesthesiology

## 2024-01-29 ENCOUNTER — Inpatient Hospital Stay (HOSPITAL_COMMUNITY): Payer: Self-pay | Admitting: Physician Assistant

## 2024-01-29 DIAGNOSIS — E785 Hyperlipidemia, unspecified: Secondary | ICD-10-CM | POA: Diagnosis present

## 2024-01-29 DIAGNOSIS — C49A3 Gastrointestinal stromal tumor of small intestine: Secondary | ICD-10-CM

## 2024-01-29 DIAGNOSIS — J90 Pleural effusion, not elsewhere classified: Secondary | ICD-10-CM | POA: Diagnosis not present

## 2024-01-29 DIAGNOSIS — N951 Menopausal and female climacteric states: Secondary | ICD-10-CM | POA: Diagnosis present

## 2024-01-29 DIAGNOSIS — D638 Anemia in other chronic diseases classified elsewhere: Secondary | ICD-10-CM | POA: Diagnosis present

## 2024-01-29 DIAGNOSIS — Z4682 Encounter for fitting and adjustment of non-vascular catheter: Secondary | ICD-10-CM | POA: Diagnosis not present

## 2024-01-29 DIAGNOSIS — G43909 Migraine, unspecified, not intractable, without status migrainosus: Secondary | ICD-10-CM | POA: Diagnosis not present

## 2024-01-29 DIAGNOSIS — Z7969 Long term (current) use of other immunomodulators and immunosuppressants: Secondary | ICD-10-CM

## 2024-01-29 DIAGNOSIS — C49A0 Gastrointestinal stromal tumor, unspecified site: Principal | ICD-10-CM

## 2024-01-29 DIAGNOSIS — J95811 Postprocedural pneumothorax: Secondary | ICD-10-CM | POA: Diagnosis not present

## 2024-01-29 DIAGNOSIS — Z8279 Family history of other congenital malformations, deformations and chromosomal abnormalities: Secondary | ICD-10-CM

## 2024-01-29 DIAGNOSIS — I4891 Unspecified atrial fibrillation: Secondary | ICD-10-CM | POA: Diagnosis present

## 2024-01-29 DIAGNOSIS — C787 Secondary malignant neoplasm of liver and intrahepatic bile duct: Secondary | ICD-10-CM | POA: Diagnosis present

## 2024-01-29 DIAGNOSIS — J939 Pneumothorax, unspecified: Secondary | ICD-10-CM | POA: Diagnosis not present

## 2024-01-29 DIAGNOSIS — I10 Essential (primary) hypertension: Secondary | ICD-10-CM | POA: Diagnosis present

## 2024-01-29 DIAGNOSIS — Z8249 Family history of ischemic heart disease and other diseases of the circulatory system: Secondary | ICD-10-CM

## 2024-01-29 DIAGNOSIS — Z79899 Other long term (current) drug therapy: Secondary | ICD-10-CM

## 2024-01-29 DIAGNOSIS — C786 Secondary malignant neoplasm of retroperitoneum and peritoneum: Secondary | ICD-10-CM | POA: Diagnosis not present

## 2024-01-29 DIAGNOSIS — Z833 Family history of diabetes mellitus: Secondary | ICD-10-CM | POA: Diagnosis not present

## 2024-01-29 DIAGNOSIS — Z170 Estrogen receptor positive status [ER+]: Secondary | ICD-10-CM

## 2024-01-29 DIAGNOSIS — R112 Nausea with vomiting, unspecified: Secondary | ICD-10-CM | POA: Diagnosis not present

## 2024-01-29 DIAGNOSIS — K5900 Constipation, unspecified: Secondary | ICD-10-CM | POA: Diagnosis present

## 2024-01-29 DIAGNOSIS — Z9049 Acquired absence of other specified parts of digestive tract: Secondary | ICD-10-CM | POA: Diagnosis not present

## 2024-01-29 DIAGNOSIS — N3289 Other specified disorders of bladder: Secondary | ICD-10-CM | POA: Diagnosis not present

## 2024-01-29 DIAGNOSIS — R0989 Other specified symptoms and signs involving the circulatory and respiratory systems: Secondary | ICD-10-CM | POA: Diagnosis not present

## 2024-01-29 DIAGNOSIS — K30 Functional dyspepsia: Secondary | ICD-10-CM | POA: Diagnosis not present

## 2024-01-29 DIAGNOSIS — M1990 Unspecified osteoarthritis, unspecified site: Secondary | ICD-10-CM | POA: Diagnosis present

## 2024-01-29 DIAGNOSIS — I340 Nonrheumatic mitral (valve) insufficiency: Secondary | ICD-10-CM | POA: Diagnosis present

## 2024-01-29 DIAGNOSIS — Z923 Personal history of irradiation: Secondary | ICD-10-CM

## 2024-01-29 DIAGNOSIS — F419 Anxiety disorder, unspecified: Secondary | ICD-10-CM | POA: Diagnosis present

## 2024-01-29 DIAGNOSIS — D62 Acute posthemorrhagic anemia: Secondary | ICD-10-CM | POA: Diagnosis not present

## 2024-01-29 DIAGNOSIS — R188 Other ascites: Secondary | ICD-10-CM | POA: Diagnosis not present

## 2024-01-29 DIAGNOSIS — D509 Iron deficiency anemia, unspecified: Secondary | ICD-10-CM | POA: Diagnosis present

## 2024-01-29 DIAGNOSIS — E876 Hypokalemia: Secondary | ICD-10-CM | POA: Diagnosis present

## 2024-01-29 DIAGNOSIS — E639 Nutritional deficiency, unspecified: Secondary | ICD-10-CM | POA: Diagnosis not present

## 2024-01-29 DIAGNOSIS — Z91040 Latex allergy status: Secondary | ICD-10-CM | POA: Diagnosis not present

## 2024-01-29 DIAGNOSIS — Q8501 Neurofibromatosis, type 1: Secondary | ICD-10-CM | POA: Diagnosis not present

## 2024-01-29 DIAGNOSIS — Z79810 Long term (current) use of selective estrogen receptor modulators (SERMs): Secondary | ICD-10-CM

## 2024-01-29 DIAGNOSIS — F32A Depression, unspecified: Secondary | ICD-10-CM | POA: Diagnosis present

## 2024-01-29 DIAGNOSIS — R109 Unspecified abdominal pain: Secondary | ICD-10-CM | POA: Diagnosis not present

## 2024-01-29 DIAGNOSIS — C49A9 Gastrointestinal stromal tumor of other sites: Secondary | ICD-10-CM | POA: Diagnosis not present

## 2024-01-29 DIAGNOSIS — I471 Supraventricular tachycardia, unspecified: Secondary | ICD-10-CM | POA: Diagnosis not present

## 2024-01-29 DIAGNOSIS — K219 Gastro-esophageal reflux disease without esophagitis: Secondary | ICD-10-CM | POA: Diagnosis present

## 2024-01-29 DIAGNOSIS — R918 Other nonspecific abnormal finding of lung field: Secondary | ICD-10-CM | POA: Diagnosis not present

## 2024-01-29 DIAGNOSIS — Z91199 Patient's noncompliance with other medical treatment and regimen due to unspecified reason: Secondary | ICD-10-CM

## 2024-01-29 DIAGNOSIS — E43 Unspecified severe protein-calorie malnutrition: Secondary | ICD-10-CM | POA: Insufficient documentation

## 2024-01-29 DIAGNOSIS — J9811 Atelectasis: Secondary | ICD-10-CM | POA: Diagnosis not present

## 2024-01-29 DIAGNOSIS — D75839 Thrombocytosis, unspecified: Secondary | ICD-10-CM | POA: Diagnosis not present

## 2024-01-29 DIAGNOSIS — Z9089 Acquired absence of other organs: Secondary | ICD-10-CM | POA: Diagnosis not present

## 2024-01-29 DIAGNOSIS — M797 Fibromyalgia: Secondary | ICD-10-CM | POA: Diagnosis present

## 2024-01-29 DIAGNOSIS — Z853 Personal history of malignant neoplasm of breast: Secondary | ICD-10-CM

## 2024-01-29 DIAGNOSIS — Z888 Allergy status to other drugs, medicaments and biological substances status: Secondary | ICD-10-CM

## 2024-01-29 DIAGNOSIS — Z823 Family history of stroke: Secondary | ICD-10-CM

## 2024-01-29 DIAGNOSIS — G8918 Other acute postprocedural pain: Secondary | ICD-10-CM | POA: Diagnosis not present

## 2024-01-29 DIAGNOSIS — I517 Cardiomegaly: Secondary | ICD-10-CM | POA: Diagnosis not present

## 2024-01-29 DIAGNOSIS — Z803 Family history of malignant neoplasm of breast: Secondary | ICD-10-CM

## 2024-01-29 DIAGNOSIS — Z90710 Acquired absence of both cervix and uterus: Secondary | ICD-10-CM

## 2024-01-29 DIAGNOSIS — C7889 Secondary malignant neoplasm of other digestive organs: Secondary | ICD-10-CM | POA: Diagnosis not present

## 2024-01-29 DIAGNOSIS — R002 Palpitations: Secondary | ICD-10-CM | POA: Diagnosis not present

## 2024-01-29 HISTORY — PX: RESECTION OF RETROPERITONEAL MASS: SHX6340

## 2024-01-29 HISTORY — PX: CHEST TUBE INSERTION: SHX231

## 2024-01-29 HISTORY — PX: ANTRECTOMY: SHX5722

## 2024-01-29 HISTORY — PX: SPLENECTOMY, TOTAL: SHX788

## 2024-01-29 LAB — BLOOD GAS, ARTERIAL
Acid-base deficit: 2.1 mmol/L — ABNORMAL HIGH (ref 0.0–2.0)
Bicarbonate: 24.7 mmol/L (ref 20.0–28.0)
O2 Saturation: 100 %
Patient temperature: 36
pCO2 arterial: 47 mmHg (ref 32–48)
pH, Arterial: 7.32 — ABNORMAL LOW (ref 7.35–7.45)
pO2, Arterial: 253 mmHg — ABNORMAL HIGH (ref 83–108)

## 2024-01-29 LAB — PREPARE RBC (CROSSMATCH)

## 2024-01-29 LAB — COMPREHENSIVE METABOLIC PANEL WITH GFR
ALT: 62 U/L — ABNORMAL HIGH (ref 0–44)
AST: 195 U/L — ABNORMAL HIGH (ref 15–41)
Albumin: 2.5 g/dL — ABNORMAL LOW (ref 3.5–5.0)
Alkaline Phosphatase: 24 U/L — ABNORMAL LOW (ref 38–126)
Anion gap: 11 (ref 5–15)
BUN: 5 mg/dL — ABNORMAL LOW (ref 6–20)
CO2: 22 mmol/L (ref 22–32)
Calcium: 8.1 mg/dL — ABNORMAL LOW (ref 8.9–10.3)
Chloride: 107 mmol/L (ref 98–111)
Creatinine, Ser: 0.76 mg/dL (ref 0.44–1.00)
GFR, Estimated: >60 mL/min (ref >60)
Glucose, Bld: 241 mg/dL — ABNORMAL HIGH (ref 70–99)
Potassium: 4.1 mmol/L (ref 3.5–5.1)
Sodium: 140 mmol/L (ref 135–145)
Total Bilirubin: 2.2 mg/dL — ABNORMAL HIGH (ref 0.0–1.2)
Total Protein: 3.7 g/dL — ABNORMAL LOW (ref 6.5–8.1)

## 2024-01-29 LAB — CBC
HCT: 31.1 % — ABNORMAL LOW (ref 36.0–46.0)
Hemoglobin: 10.6 g/dL — ABNORMAL LOW (ref 12.0–15.0)
MCH: 29.9 pg (ref 26.0–34.0)
MCHC: 34.1 g/dL (ref 30.0–36.0)
MCV: 87.6 fL (ref 80.0–100.0)
Platelets: 98 K/uL — ABNORMAL LOW (ref 150–400)
RBC: 3.55 MIL/uL — ABNORMAL LOW (ref 3.87–5.11)
RDW: 14 % (ref 11.5–15.5)
WBC: 11 K/uL — ABNORMAL HIGH (ref 4.0–10.5)
nRBC: 0 % (ref 0.0–0.2)

## 2024-01-29 LAB — MRSA NEXT GEN BY PCR, NASAL: MRSA by PCR Next Gen: NOT DETECTED

## 2024-01-29 LAB — PROTIME-INR
INR: 1.6 — ABNORMAL HIGH (ref 0.8–1.2)
Prothrombin Time: 19.2 s — ABNORMAL HIGH (ref 11.4–15.2)

## 2024-01-29 SURGERY — EXCISION, MASS, RETROPERITONEUM
Anesthesia: General

## 2024-01-29 MED ORDER — LACTATED RINGERS IV SOLN: INTRAVENOUS | Status: DC | PRN

## 2024-01-29 MED ORDER — HYDROMORPHONE HCL 1 MG/ML IJ SOLN
INTRAMUSCULAR | Status: DC | PRN
  Administered 2024-01-29 (×2): .5 mg via EPIDURAL

## 2024-01-29 MED ORDER — PHENYLEPHRINE 80 MCG/ML (10ML) SYRINGE FOR IV PUSH (FOR BLOOD PRESSURE SUPPORT)
PREFILLED_SYRINGE | INTRAVENOUS | Status: AC
  Filled 2024-01-29: qty 10

## 2024-01-29 MED ORDER — FENTANYL CITRATE (PF) 250 MCG/5ML IJ SOLN
INTRAMUSCULAR | Status: DC | PRN
  Administered 2024-01-29 (×5): 50 ug via INTRAVENOUS

## 2024-01-29 MED ORDER — ACETAMINOPHEN 10 MG/ML IV SOLN
1000.0000 mg | Freq: Four times a day (QID) | INTRAVENOUS | Status: AC
Start: 2024-01-29 — End: 2024-01-30
  Administered 2024-01-29 – 2024-01-30 (×3): 1000 mg via INTRAVENOUS
  Filled 2024-01-29 (×4): qty 100

## 2024-01-29 MED ORDER — CEFAZOLIN SODIUM-DEXTROSE 2-4 GM/100ML-% IV SOLN
2.0000 g | Freq: Three times a day (TID) | INTRAVENOUS | Status: AC
  Administered 2024-01-29: 2 g via INTRAVENOUS
  Filled 2024-01-29: qty 100

## 2024-01-29 MED ORDER — ACETAMINOPHEN 500 MG PO TABS: 1000.0000 mg | ORAL_TABLET | Freq: Once | ORAL | Status: DC

## 2024-01-29 MED ORDER — CHLORHEXIDINE GLUCONATE CLOTH 2 % EX PADS: 6.0000 | MEDICATED_PAD | Freq: Once | CUTANEOUS | Status: DC

## 2024-01-29 MED ORDER — STERILE WATER FOR IRRIGATION IR SOLN
Status: DC | PRN
  Administered 2024-01-29: 1000 mL

## 2024-01-29 MED ORDER — ACETAMINOPHEN 10 MG/ML IV SOLN
INTRAVENOUS | Status: AC
  Filled 2024-01-29: qty 100

## 2024-01-29 MED ORDER — HEPARIN SODIUM (PORCINE) 5000 UNIT/ML IJ SOLN
5000.0000 [IU] | Freq: Three times a day (TID) | INTRAMUSCULAR | Status: DC
  Administered 2024-01-30 – 2024-02-01 (×7): 5000 [IU] via SUBCUTANEOUS
  Filled 2024-01-29 (×7): qty 1

## 2024-01-29 MED ORDER — SODIUM CHLORIDE (PF) 0.9 % IJ SOLN
INTRAMUSCULAR | Status: AC
  Filled 2024-01-29: qty 10

## 2024-01-29 MED ORDER — SODIUM CHLORIDE 0.9% IV SOLUTION: Freq: Once | INTRAVENOUS | Status: DC

## 2024-01-29 MED ORDER — ROPIVACAINE HCL 2 MG/ML IJ SOLN
8.0000 mL/h | INTRAMUSCULAR | Status: DC
  Administered 2024-01-29: 8 mL/h via EPIDURAL
  Filled 2024-01-29: qty 200
  Filled 2024-01-29: qty 100
  Filled 2024-01-29: qty 200

## 2024-01-29 MED ORDER — HYDROMORPHONE HCL 1 MG/ML IJ SOLN
INTRAMUSCULAR | Status: AC
Start: 2024-01-29 — End: ?
  Filled 2024-01-29: qty 0.5

## 2024-01-29 MED ORDER — PHENYLEPHRINE 80 MCG/ML (10ML) SYRINGE FOR IV PUSH (FOR BLOOD PRESSURE SUPPORT)
PREFILLED_SYRINGE | INTRAVENOUS | Status: DC | PRN
  Administered 2024-01-29: 60 ug via INTRAVENOUS
  Administered 2024-01-29: 80 ug via INTRAVENOUS
  Administered 2024-01-29 (×2): 40 ug via INTRAVENOUS
  Administered 2024-01-29: 80 ug via INTRAVENOUS
  Administered 2024-01-29: 160 ug via INTRAVENOUS
  Administered 2024-01-29: 80 ug via INTRAVENOUS
  Administered 2024-01-29: 160 ug via INTRAVENOUS
  Administered 2024-01-29: 80 ug via INTRAVENOUS
  Administered 2024-01-29: 40 ug via INTRAVENOUS

## 2024-01-29 MED ORDER — PHENYLEPHRINE HCL-NACL 20-0.9 MG/250ML-% IV SOLN
25.0000 ug/min | INTRAVENOUS | Status: DC
  Administered 2024-01-29: 85 ug/min via INTRAVENOUS
  Administered 2024-01-29: 75 ug/min via INTRAVENOUS
  Administered 2024-01-30: 35 ug/min via INTRAVENOUS
  Filled 2024-01-29 (×2): qty 250

## 2024-01-29 MED ORDER — ACETAMINOPHEN 10 MG/ML IV SOLN
INTRAVENOUS | Status: DC | PRN
  Administered 2024-01-29: 1000 mg via INTRAVENOUS

## 2024-01-29 MED ORDER — ONDANSETRON HCL 4 MG/2ML IJ SOLN
INTRAMUSCULAR | Status: AC
  Filled 2024-01-29: qty 2

## 2024-01-29 MED ORDER — CALCIUM GLUCONATE 10 % IV SOLN: INTRAVENOUS | Status: DC | PRN

## 2024-01-29 MED ORDER — CHLORHEXIDINE GLUCONATE 0.12 % MT SOLN
OROMUCOSAL | Status: AC
  Administered 2024-01-29: 15 mL
  Filled 2024-01-29: qty 15

## 2024-01-29 MED ORDER — SODIUM CHLORIDE 0.9 % IV SOLN: INTRAVENOUS | Status: DC | PRN

## 2024-01-29 MED ORDER — ROCURONIUM BROMIDE 10 MG/ML (PF) SYRINGE
PREFILLED_SYRINGE | INTRAVENOUS | Status: DC | PRN
  Administered 2024-01-29: 20 mg via INTRAVENOUS
  Administered 2024-01-29: 30 mg via INTRAVENOUS
  Administered 2024-01-29: 20 mg via INTRAVENOUS
  Administered 2024-01-29: 10 mg via INTRAVENOUS
  Administered 2024-01-29: 60 mg via INTRAVENOUS

## 2024-01-29 MED ORDER — MIDAZOLAM HCL 2 MG/2ML IJ SOLN
INTRAMUSCULAR | Status: DC | PRN
Start: 2024-01-29 — End: 2024-01-29
  Administered 2024-01-29: 2 mg via INTRAVENOUS

## 2024-01-29 MED ORDER — BUPIVACAINE-EPINEPHRINE (PF) 0.25% -1:200000 IJ SOLN
INTRAMUSCULAR | Status: AC
  Filled 2024-01-29: qty 30

## 2024-01-29 MED ORDER — KCL IN DEXTROSE-NACL 20-5-0.45 MEQ/L-%-% IV SOLN
INTRAVENOUS | Status: AC
  Filled 2024-01-29 (×3): qty 1000

## 2024-01-29 MED ORDER — DEXAMETHASONE SODIUM PHOSPHATE 10 MG/ML IJ SOLN
INTRAMUSCULAR | Status: AC
  Filled 2024-01-29: qty 1

## 2024-01-29 MED ORDER — DROPERIDOL 2.5 MG/ML IJ SOLN: 0.6250 mg | Freq: Once | INTRAMUSCULAR | Status: DC | PRN

## 2024-01-29 MED ORDER — CALCIUM GLUCONATE-NACL 2-0.675 GM/100ML-% IV SOLN
2.0000 g | Freq: Once | INTRAVENOUS | Status: AC
  Administered 2024-01-29: 2 g via INTRAVENOUS
  Filled 2024-01-29: qty 100

## 2024-01-29 MED ORDER — ROCURONIUM BROMIDE 10 MG/ML (PF) SYRINGE
PREFILLED_SYRINGE | INTRAVENOUS | Status: AC
  Filled 2024-01-29: qty 10

## 2024-01-29 MED ORDER — CALCIUM CHLORIDE 10 % IV SOLN
INTRAVENOUS | Status: AC
  Filled 2024-01-29: qty 10

## 2024-01-29 MED ORDER — DIPHENHYDRAMINE HCL 50 MG/ML IJ SOLN: 12.5000 mg | Freq: Four times a day (QID) | INTRAMUSCULAR | Status: DC | PRN

## 2024-01-29 MED ORDER — HYDROMORPHONE 1 MG/ML IV SOLN
INTRAVENOUS | Status: DC
  Administered 2024-01-29: 30 mg via INTRAVENOUS
  Administered 2024-01-31: 0.6 mg via INTRAVENOUS
  Administered 2024-01-31 – 2024-02-01 (×2): 0.9 mg via INTRAVENOUS
  Filled 2024-01-29 (×5): qty 30

## 2024-01-29 MED ORDER — SCOPOLAMINE 1 MG/3DAYS TD PT72
1.0000 | MEDICATED_PATCH | Freq: Once | TRANSDERMAL | Status: DC
  Administered 2024-01-29: 1.5 mg via TRANSDERMAL
  Filled 2024-01-29: qty 1

## 2024-01-29 MED ORDER — ONDANSETRON HCL 4 MG/2ML IJ SOLN
4.0000 mg | Freq: Four times a day (QID) | INTRAMUSCULAR | Status: DC | PRN
  Administered 2024-01-30 – 2024-02-01 (×6): 4 mg via INTRAVENOUS
  Filled 2024-01-29 (×6): qty 2

## 2024-01-29 MED ORDER — SUGAMMADEX SODIUM 200 MG/2ML IV SOLN
INTRAVENOUS | Status: DC | PRN
  Administered 2024-01-29: 200 mg via INTRAVENOUS

## 2024-01-29 MED ORDER — ALBUMIN HUMAN 5 % IV SOLN: INTRAVENOUS | Status: DC | PRN

## 2024-01-29 MED ORDER — ROPIVACAINE HCL 2 MG/ML IJ SOLN
8.0000 mL/h | INTRAMUSCULAR | Status: DC
  Administered 2024-01-30 – 2024-02-02 (×3): 8 mL/h via EPIDURAL
  Filled 2024-01-29 (×7): qty 200

## 2024-01-29 MED ORDER — PROCHLORPERAZINE EDISYLATE 10 MG/2ML IJ SOLN
5.0000 mg | Freq: Four times a day (QID) | INTRAMUSCULAR | Status: DC | PRN
  Administered 2024-01-30 – 2024-02-01 (×2): 10 mg via INTRAVENOUS
  Administered 2024-02-02 (×2): 5 mg via INTRAVENOUS
  Administered 2024-02-02 – 2024-02-10 (×14): 10 mg via INTRAVENOUS
  Filled 2024-01-29 (×3): qty 2
  Filled 2024-01-29: qty 10
  Filled 2024-01-29 (×15): qty 2

## 2024-01-29 MED ORDER — FENTANYL CITRATE (PF) 250 MCG/5ML IJ SOLN
INTRAMUSCULAR | Status: AC
  Filled 2024-01-29: qty 5

## 2024-01-29 MED ORDER — METHOCARBAMOL 1000 MG/10ML IJ SOLN
500.0000 mg | Freq: Three times a day (TID) | INTRAMUSCULAR | Status: DC
  Administered 2024-01-29 – 2024-02-03 (×14): 500 mg via INTRAVENOUS
  Filled 2024-01-29 (×13): qty 10

## 2024-01-29 MED ORDER — PROCHLORPERAZINE MALEATE 10 MG PO TABS: 10.0000 mg | ORAL_TABLET | Freq: Four times a day (QID) | ORAL | Status: DC | PRN

## 2024-01-29 MED ORDER — BUPIVACAINE LIPOSOME 1.3 % IJ SUSP
INTRAMUSCULAR | Status: AC
  Filled 2024-01-29: qty 20

## 2024-01-29 MED ORDER — VISTASEAL 10 ML SINGLE DOSE KIT
10.0000 mL | PACK | Freq: Once | CUTANEOUS | Status: AC
  Administered 2024-01-29: 10 mL via TOPICAL
  Filled 2024-01-29: qty 10

## 2024-01-29 MED ORDER — METOPROLOL TARTRATE 5 MG/5ML IV SOLN
5.0000 mg | Freq: Three times a day (TID) | INTRAVENOUS | Status: DC
  Administered 2024-01-30 – 2024-02-01 (×5): 5 mg via INTRAVENOUS
  Filled 2024-01-29 (×5): qty 5

## 2024-01-29 MED ORDER — HYDROMORPHONE 1 MG/ML IV SOLN
INTRAVENOUS | Status: AC
  Filled 2024-01-29: qty 30

## 2024-01-29 MED ORDER — CEFAZOLIN SODIUM 1 G IJ SOLR
INTRAMUSCULAR | Status: AC
  Filled 2024-01-29: qty 20

## 2024-01-29 MED ORDER — ALBUMIN HUMAN 25 % IV SOLN
25.0000 g | Freq: Four times a day (QID) | INTRAVENOUS | Status: AC
  Administered 2024-01-29 – 2024-01-31 (×8): 25 g via INTRAVENOUS
  Filled 2024-01-29 (×8): qty 100

## 2024-01-29 MED ORDER — ONDANSETRON HCL 4 MG/2ML IJ SOLN
INTRAMUSCULAR | Status: DC | PRN
Start: 2024-01-29 — End: 2024-01-29
  Administered 2024-01-29: 4 mg via INTRAVENOUS

## 2024-01-29 MED ORDER — DIPHENHYDRAMINE HCL 12.5 MG/5ML PO ELIX: 12.5000 mg | ORAL_SOLUTION | Freq: Four times a day (QID) | ORAL | Status: DC | PRN

## 2024-01-29 MED ORDER — SODIUM BICARBONATE 8.4 % IV SOLN
INTRAVENOUS | Status: DC | PRN
  Administered 2024-01-29: 50 meq via INTRAVENOUS

## 2024-01-29 MED ORDER — LIDOCAINE 2% (20 MG/ML) 5 ML SYRINGE
INTRAMUSCULAR | Status: DC | PRN
  Administered 2024-01-29: 60 mg via INTRAVENOUS

## 2024-01-29 MED ORDER — CALCIUM CHLORIDE 10 % IV SOLN
INTRAVENOUS | Status: DC | PRN
  Administered 2024-01-29: 1 g via INTRAVENOUS

## 2024-01-29 MED ORDER — NALOXONE HCL 0.4 MG/ML IJ SOLN: 0.4000 mg | INTRAMUSCULAR | Status: DC | PRN

## 2024-01-29 MED ORDER — PHENYLEPHRINE HCL-NACL 20-0.9 MG/250ML-% IV SOLN
INTRAVENOUS | Status: DC | PRN
  Administered 2024-01-29: 50 ug/min via INTRAVENOUS

## 2024-01-29 MED ORDER — SODIUM CHLORIDE 0.9 % IV SOLN
250.0000 mL | INTRAVENOUS | Status: AC
  Administered 2024-01-29: 250 mL via INTRAVENOUS

## 2024-01-29 MED ORDER — LORAZEPAM BOLUS VIA INFUSION: 0.5000 mg | Freq: Three times a day (TID) | INTRAVENOUS | Status: DC | PRN

## 2024-01-29 MED ORDER — HYDROMORPHONE HCL 1 MG/ML IJ SOLN
INTRAMUSCULAR | Status: AC
  Filled 2024-01-29: qty 0.5

## 2024-01-29 MED ORDER — DEXAMETHASONE SODIUM PHOSPHATE 10 MG/ML IJ SOLN
INTRAMUSCULAR | Status: DC | PRN
  Administered 2024-01-29: 10 mg via INTRAVENOUS

## 2024-01-29 MED ORDER — 0.9 % SODIUM CHLORIDE (POUR BTL) OPTIME
TOPICAL | Status: DC | PRN
  Administered 2024-01-29: 1000 mL

## 2024-01-29 MED ORDER — LIDOCAINE 2% (20 MG/ML) 5 ML SYRINGE
INTRAMUSCULAR | Status: AC
  Filled 2024-01-29: qty 5

## 2024-01-29 MED ORDER — SODIUM BICARBONATE 8.4 % IV SOLN
INTRAVENOUS | Status: AC
  Filled 2024-01-29: qty 50

## 2024-01-29 MED ORDER — MIDAZOLAM HCL 2 MG/2ML IJ SOLN
INTRAMUSCULAR | Status: AC
Start: 2024-01-29 — End: ?
  Filled 2024-01-29: qty 2

## 2024-01-29 MED ORDER — CHLORHEXIDINE GLUCONATE CLOTH 2 % EX PADS
6.0000 | MEDICATED_PAD | Freq: Every day | CUTANEOUS | Status: DC
  Administered 2024-01-30 – 2024-02-08 (×10): 6 via TOPICAL

## 2024-01-29 MED ORDER — PROPOFOL 10 MG/ML IV BOLUS
INTRAVENOUS | Status: AC
  Filled 2024-01-29: qty 20

## 2024-01-29 MED ORDER — LIDOCAINE-EPINEPHRINE (PF) 1.5 %-1:200000 IJ SOLN
INTRAMUSCULAR | Status: DC | PRN
  Administered 2024-01-29: 3 mL via EPIDURAL

## 2024-01-29 MED ORDER — SODIUM CHLORIDE 0.9% FLUSH: 9.0000 mL | INTRAVENOUS | Status: DC | PRN

## 2024-01-29 MED ORDER — CEFAZOLIN SODIUM-DEXTROSE 2-4 GM/100ML-% IV SOLN
2.0000 g | INTRAVENOUS | Status: AC
  Administered 2024-01-29 (×2): 2 g via INTRAVENOUS
  Filled 2024-01-29: qty 100

## 2024-01-29 MED ORDER — PROPOFOL 10 MG/ML IV BOLUS
INTRAVENOUS | Status: DC | PRN
  Administered 2024-01-29: 20 mg via INTRAVENOUS
  Administered 2024-01-29 (×2): 30 mg via INTRAVENOUS
  Administered 2024-01-29 (×3): 20 mg via INTRAVENOUS
  Administered 2024-01-29: 120 mg via INTRAVENOUS
  Administered 2024-01-29: 30 mg via INTRAVENOUS

## 2024-01-29 MED ORDER — HYDROMORPHONE HCL 1 MG/ML IJ SOLN: 0.2500 mg | INTRAMUSCULAR | Status: DC | PRN

## 2024-01-29 SURGICAL SUPPLY — 80 items
BAG BILE T-TUBES STRL (MISCELLANEOUS) IMPLANT
BAG COUNTER SPONGE SURGICOUNT (BAG) ×2 IMPLANT
BIOPATCH RED 1 DISK 7.0 (GAUZE/BANDAGES/DRESSINGS) IMPLANT
BLADE CLIPPER SURG (BLADE) IMPLANT
CANISTER SUCTION 3000ML PPV (SUCTIONS) ×2 IMPLANT
CATH KIT ON-Q SILVERSOAK 7.5 (CATHETERS) IMPLANT
CATH KIT ON-Q SILVERSOAK 7.5IN (CATHETERS) IMPLANT
CHLORAPREP W/TINT 26 (MISCELLANEOUS) ×2 IMPLANT
CLIP LIGATING HEMO O LOK GREEN (MISCELLANEOUS) IMPLANT
CLIP TI LARGE 6 (CLIP) IMPLANT
COVER SURGICAL LIGHT HANDLE (MISCELLANEOUS) ×2 IMPLANT
DRAIN CHANNEL 19F RND (DRAIN) ×2 IMPLANT
DRAIN PENROSE 0.5X18 (DRAIN) IMPLANT
DRAPE LAPAROSCOPIC ABDOMINAL (DRAPES) ×2 IMPLANT
DRAPE UTILITY XL STRL (DRAPES) IMPLANT
DRAPE WARM FLUID 44X44 (DRAPES) ×2 IMPLANT
DRSG COVADERM 4X10 (GAUZE/BANDAGES/DRESSINGS) IMPLANT
DRSG COVADERM 4X8 (GAUZE/BANDAGES/DRESSINGS) IMPLANT
DRSG OPSITE POSTOP 4X10 (GAUZE/BANDAGES/DRESSINGS) ×2 IMPLANT
DRSG TEGADERM 4X4.5 CHG (GAUZE/BANDAGES/DRESSINGS) IMPLANT
DRSG TEGADERM 4X4.75 (GAUZE/BANDAGES/DRESSINGS) IMPLANT
ELECT BLADE 6.5 EXT (BLADE) ×2 IMPLANT
ELECT PAD DSPR THERM+ ADLT (MISCELLANEOUS) IMPLANT
ELECTRODE REM PT RTRN 9FT ADLT (ELECTROSURGICAL) ×2 IMPLANT
EVACUATOR SILICONE 100CC (DRAIN) IMPLANT
FELT TEFLON 1X6 (MISCELLANEOUS) IMPLANT
GAUZE SPONGE 4X4 12PLY STRL (GAUZE/BANDAGES/DRESSINGS) ×2 IMPLANT
GLOVE BIO SURGEON STRL SZ 6 (GLOVE) ×2 IMPLANT
GLOVE INDICATOR 6.5 STRL GRN (GLOVE) ×2 IMPLANT
GOWN STRL REUS W/ TWL LRG LVL3 (GOWN DISPOSABLE) ×4 IMPLANT
GOWN STRL REUS W/ TWL XL LVL3 (GOWN DISPOSABLE) ×4 IMPLANT
HAND PENCIL TRP OPTION (MISCELLANEOUS) IMPLANT
HANDLE SUCTION POOLE (INSTRUMENTS) ×2 IMPLANT
HEMOSTAT SURGICEL 2X14 (HEMOSTASIS) IMPLANT
J-TUBE MIC 16FX51 UNV ENFIT (TUBING) IMPLANT
KIT BASIN OR (CUSTOM PROCEDURE TRAY) ×2 IMPLANT
KIT TURNOVER KIT B (KITS) ×2 IMPLANT
LIGASURE IMPACT 36 18CM CVD LR (INSTRUMENTS) IMPLANT
NS IRRIG 1000ML POUR BTL (IV SOLUTION) ×4 IMPLANT
PACK GENERAL/GYN (CUSTOM PROCEDURE TRAY) ×2 IMPLANT
PAD ARMBOARD POSITIONER FOAM (MISCELLANEOUS) ×4 IMPLANT
RELOAD 45 VASCULAR/THIN (ENDOMECHANICALS) IMPLANT
RELOAD PROXIMATE 75MM BLUE (ENDOMECHANICALS) ×4 IMPLANT
RELOAD STAPLE 45 2.5 WHT GRN (ENDOMECHANICALS) IMPLANT
RELOAD STAPLE 60 2.6 WHT THN (STAPLE) IMPLANT
RELOAD STAPLE 75 3.8 BLU REG (ENDOMECHANICALS) IMPLANT
RELOAD STAPLER WHITE 60MM (STAPLE) ×6 IMPLANT
SHEARS FOC LG CVD HARMONIC 17C (MISCELLANEOUS) IMPLANT
SLEEVE SUCTION CATH 165 (SLEEVE) ×2 IMPLANT
SPECIMEN JAR LARGE (MISCELLANEOUS) ×2 IMPLANT
SPECIMEN JAR X LARGE (MISCELLANEOUS) ×2 IMPLANT
SPONGE T-LAP 18X18 ~~LOC~~+RFID (SPONGE) ×4 IMPLANT
STAPLE ECHEON FLEX 60 POW ENDO (STAPLE) IMPLANT
STAPLER PROXIMATE 75MM BLUE (STAPLE) IMPLANT
STAPLER VISISTAT 35W (STAPLE) ×2 IMPLANT
SUT CHROMIC 0 BP (SUTURE) IMPLANT
SUT ETHILON 2 0 FS 18 (SUTURE) ×2 IMPLANT
SUT NOVA 1 T20/GS 25DT (SUTURE) IMPLANT
SUT PDS AB 1 TP1 54 (SUTURE) IMPLANT
SUT PDS AB 1 TP1 96 (SUTURE) ×4 IMPLANT
SUT PDS AB 3-0 SH 27 (SUTURE) IMPLANT
SUT PROLENE 2 0 SH DA (SUTURE) IMPLANT
SUT PROLENE 3 0 SH DA (SUTURE) IMPLANT
SUT PROLENE 4-0 RB1 .5 CRCL 36 (SUTURE) IMPLANT
SUT SILK 0 TIES 10X30 (SUTURE) ×2 IMPLANT
SUT SILK 2 0 REEL (SUTURE) IMPLANT
SUT SILK 2 0 SH (SUTURE) IMPLANT
SUT SILK 2 0 SH CR/8 (SUTURE) ×4 IMPLANT
SUT SILK 2 0 TIES 10X30 (SUTURE) ×2 IMPLANT
SUT SILK 3 0 SH CR/8 (SUTURE) ×2 IMPLANT
SUT SILK 3 0 TIES 10X30 (SUTURE) ×2 IMPLANT
SUT VIC AB 2-0 CT1 TAPERPNT 27 (SUTURE) IMPLANT
SUT VIC AB 3-0 SH 8-18 (SUTURE) IMPLANT
SYSTEM SAHARA CHEST DRAIN ATS (WOUND CARE) IMPLANT
TOWEL GREEN STERILE (TOWEL DISPOSABLE) ×2 IMPLANT
TOWEL GREEN STERILE FF (TOWEL DISPOSABLE) ×2 IMPLANT
TRAY WAYNE PNEUMOTHORAX 14X18 (TRAY / TRAY PROCEDURE) IMPLANT
TUBE JEJUNAL 16FR ENFIT (TUBING) IMPLANT
TUNNELER SHEATH ON-Q 16GX12 DP (PAIN MANAGEMENT) IMPLANT
YANKAUER SUCT BULB TIP NO VENT (SUCTIONS) IMPLANT

## 2024-01-29 NOTE — H&P (Signed)
 PROVIDER: Eppie Hasting, MD Patient Care Team: Rollen Clines, NP as PCP - General Sonja Cloverdale, MD (Hematology and Oncology) Anne Hasting, MD as Consulting Provider (Surgical Oncology) Clydene Darner, MD (Hematology and Oncology)  MRN: [id number] DOB: September 26, 1975 DATE OF ENCOUNTER: 01/26/2024  Chief Complaint: Follow-up (discuss sx)   History of Present Illness: Anne Horton is a 48 y.o. female who is seen today for GIST follow up.  Initial history:   Patient presented with stage I left breast cancer in 2019. She had breast conserving therapy with Dr. Delane Fear followed by low Oncotype score, radiation, and tamoxifen  therapy which she is taking for a total of 10 years.   I met her June 2022. She was seen to have a 16 cm mass in her abdomen that was felt to likely be a GIST. I explored her February 26, 2021 she was found to have the primary large GIST as well as multiple others. This was not surprising given that she has neurofibromatosis 1 which makes her high risk for multifocal GIST tumors.   She had a relatively long small bowel resection with 28 tumors of varying sizes. These all appeared to be in the jejunum. She had 250 cm of remaining bowel. There were several just that were not able to be resected due to the proximity to the ligament of Treitz. The anastomosis was 10 cm beyond the ligament of Treitz. Beyond the distal resection site there was another small 2 to 3 mm mass. Final path was [id number], stage IIIB, high grade, mitotic rate 9/hpf.   She started Gleevec  which was planned to be continued for 3 years. In April 2023 she had an episode of severe abdominal pain with nausea and vomiting that brought her to the emergency department. She ended up seeing Dr. Leighton Punches as I was out for medical leave. She had some thickening at the jejunojejunostomy anastomosis as well as a small bowel short segment intussusception and a few small soft tissue nodules. There was consideration for  exploring her, but because her symptoms were improving she had a upper endoscopy with extension just past the ligament of Treitz. She was seen she did not have any evidence of recurrence at the jejunojejunostomy. Her symptoms resolved.  Follow-up scan performed in June 2023 which showed no evidence of recurrence and resolved intussusception. She did have a large right ovarian cyst.  Pt continued on gleevec  and tamoxifen  and had restaging studies 02/2023. Unfortunately this did show a recurrent mass that was 8 cm. She had MR to try to help distinguish this as bowel vs adnexa and it wasn't clear from that which it was.   I did a robotic resection of malignant pelvic mass with incidental appendectomy with Dr. Orvil Bland who did a lap LOA, robot TAH/BSO 04/08/24. At that time there were numerous implants in the pelvis and sidewall.   She did have a trial of Sutent  which made her jaundiced within about 1 to 2 weeks. She felt very ill. This was discontinued. She subsequently underwent CT abdomen pelvis at [facility] which was concerning for potential recurrence with a ill-defined soft tissue lesion near the liver as well as 1 near the spleen. These were considered to be possible peritoneal implants. The one near the liver was around 2.3 cm. Near the spleen was not given measurement. Patient had a CT chest which also demonstrated the perihepatic and perisplenic lesions. The perihepatic soft tissue lesion was 2.1 cm and the perisplenic 1 was 1.4 cm. There  was a concern for left internal mammary adenopathy as one of the nodes was 5 mm compared to 3 mm.   She has gotten opinions at [facility] and [facility].   Interval history:   History of Present Illness Anne Horton is a 48 year old female with gastrointestinal stromal tumor (GIST) who presents for pre-operative evaluation and discussion of upcoming surgery. She is accompanied by her husband.  She is scheduled for surgery in three days to address her GIST, focusing on  the spleen, stomach, and liver. She experiences pain in the right upper abdomen, associated with a known liver mass approximately 10 centimeters in size. Recent scan showed rapid increase in size of liver and perisplenic mass as well as an antral mass.   Her treatment history includes Gleevec , discontinued on the seventh of the month due to lack of efficacy, and tamoxifen , stopped two weeks ago. She has received splenic vaccines in preparation for surgery.  She has a known reaction to Dermabond, particularly when heat is applied, leading to the decision to use staples for the upcoming surgery.  01/08/24 IMPRESSION: 1. Significant interval enlargement of the right hepatic and splenic lesions consistent with very aggressive metastatic GIST tumors. 2. Multiple enhancing soft tissue lesions in the pelvis consistent with progressive recurrent tumor. 3. New 9 mm omental nodules in the right abdomen and enlarged right external iliac nodes. 4. 4.9 x 2.3 x 3.5 cm area of moderate to marked gastric wall thickening involving the antral region of the stomach. This is worrisome for a gastric neoplasm (GIST).  CT chest 12/09/23  IMPRESSION: 1. Unchanged appearance of 6 mm nodule in the right lower lung. No new pulmonary nodules are identified. Follow-up per protocol. 2. Increasing size of low-attenuation lesion arising from or adjacent to the spleen. Recommend CT abdomen and pelvis for better characterization. 3. Large low-attenuation lesion in the right lobe of the liver measuring 10.1 cm maximal diameter. This also may be enlarged since prior CT abdomen and pelvis  CT abd/pelvis 11/11/23 IMPRESSION: 1. Increased size of the heterogeneous lesion in the posterior right lobe of the liver and perisplenic lesion. 2. Recurrence of the soft tissue nodule in the left adnexa which abuts the sigmoid colon measuring 19 x 18 mm. 3. Stable soft tissue nodularity along the left pericolic gutter measuring up  to 7 mm. 4. Stable cutaneous soft tissue nodules in the anterior abdominal wall. 5. Similar appearance of the sclerotic lesion in the inferior L1 endplate.  Review of Systems: A complete review of systems was obtained from the patient. I have reviewed this information and discussed as appropriate with the patient. See HPI as well for other ROS.  Review of Systems  All other systems reviewed and are negative.   Medical History: Past Medical History:  Diagnosis Date  Anemia  Anxiety  Arthritis  Breast cancer (CMS/HHS-HCC)  Depression  Malignant GIST (gastrointestinal stromal tumor) of small intestine (CMS/HHS-HCC)  As per onc problem list  Metastasis to peritoneal cavity (CMS/HHS-HCC)  Neurofibromatosis (CMS/HHS-HCC)   Patient Active Problem List  Diagnosis  Anemia, iron  deficiency  GIST (gastrointestinal stromal tumor) of small bowel, malignant (CMS/HHS-HCC)  Malignant neoplasm of upper-outer quadrant of left breast in female, estrogen receptor positive (CMS/HHS-HCC)  Migraine headache  Neurofibromatosis (CMS/HHS-HCC)  Nonrheumatic mitral (valve) insufficiency  Sinus tachycardia  Pelvic mass  Right ovarian cyst  Metastasis to peritoneal cavity (CMS/HHS-HCC)   Past Surgical History:  Procedure Laterality Date  CESAREAN SECTION 11/2000  CHOLECYSTECTOMY 01/04/2015  Left breast  lumpectomy 05/07/2018  Dr. Delane Fear  mastoplexy Bilateral 09/05/2020  Exploratory Laparotomy with Resection of Abdominal Mass 04/03/2021  Dr. Cherlynn Cornfield  Small Bowel Resection & Omentectomy 04/03/2021  Dr. Cherlynn Cornfield  XI ROBOT ASSISTED DIAGNOSTIC LAPAROSCOPY, ROBOTIC assisted OPEN MASS EXCISION, TOTAL ROBOTIC HYSTERECTOMY WITH BILATERAL SALPINGO OOPHORECTOMY, CYSTOSCOPY XI ROBOTIC ASSISTED REMOVAL OF PELVIC MASS; INCIDENTAL LAPAROSCOPIC APPENDECTOMY 04/09/2023  Dr. Cherlynn Cornfield  APPENDECTOMY  BREAST BIOPSY  BREAST SURGERY  HYSTERECTOMY  LYMPH NODE BIOPSY  OOPHORECTOMY    Allergies  Allergen Reactions   Iron  Sucrose Hives, Other (See Comments) and Rash  Other reaction(s): Respiratory Distress (ALLERGY/intolerance)  Respiratory Distress (ALLERGY/intolerance) seizure like activity  Latex Rash  Latex, Natural Rubber Itching and Rash   Current Outpatient Medications on File Prior to Visit  Medication Sig Dispense Refill  ALPRAZolam  (XANAX ) 0.5 MG tablet Take 1/2 to 1 tablet by mouth 2 times a day as needed  cetirizine (ZYRTEC) 10 MG tablet Take 10 mg by mouth once daily  imatinib  (GLEEVEC ) 400 MG tablet Take 1 tablet (400 mg total) by mouth 2 (two) times a day. Take with a large glass of water . You may dissolve in water  or apple juice to make swallowing easier. Should be taken with a meal. Do not take on an empty stomach.  multivitamin (MULTIVITAMIN) tablet Take 1 tablet by mouth once daily  ondansetron  (ZOFRAN -ODT) 4 MG disintegrating tablet Dissolve 1 tablet by mouth as needed for nausea 20 tablet 0  tamoxifen  (NOLVADEX ) 20 MG tablet Take 1 tablet by mouth once daily  venlafaxine  (EFFEXOR -XR) 75 MG XR capsule Take by mouth  gabapentin  (NEURONTIN ) 100 MG capsule Take by mouth  metoprolol  succinate (TOPROL -XL) 50 MG XL tablet Take by mouth  propranoloL  (INDERAL ) 10 MG tablet Take 10 mg by mouth as needed  SUNItinib malate  (SUTENT ) 50 MG capsule Take 50 mg by mouth 4 weeks on/2 weeks off   No current facility-administered medications on file prior to visit.   Family History  Problem Relation Age of Onset  High blood pressure (Hypertension) Mother  Hyperlipidemia (Elevated cholesterol) Mother  Diabetes Mother  Breast cancer Mother 101  Neurofibromatosis Father  Atrial fibrillation (Abnormal heart rhythm sometimes requiring treatment with blood thinners) Father  Heart failure Father  Neurofibromatosis Paternal Grandmother    Social History   Tobacco Use  Smoking Status Never  Smokeless Tobacco Never    Social History   Socioeconomic History  Marital status: Married  Tobacco  Use  Smoking status: Never  Smokeless tobacco: Never  Vaping Use  Vaping status: Never Used  Substance and Sexual Activity  Alcohol use: Not Currently  Drug use: Never  Sexual activity: Yes  Partners: Male  Birth control/protection: Surgical, Other-see comments  Comment: Hysterectomy,salpingo-oophorectomy   Social Drivers of Health   Financial Resource Strain: Low Risk (11/17/2020)  Received from [facility] visits prior to 11/16/2022.  Overall Financial Resource Strain (CARDIA)  Difficulty of Paying Living Expenses: Not hard at all  Food Insecurity: Patient Declined (04/09/2023)  Received from [facility]  Hunger Vital Sign  Worried About Running Out of Food in the Last Year: Patient declined  Ran Out of Food in the Last Year: Patient declined  Transportation Needs: Patient Declined (04/09/2023)  Received from [facility] - Transportation  Lack of Transportation (Medical): Patient declined  Lack of Transportation (Non-Medical): Patient declined  Physical Activity: Inactive (11/17/2020)  Received from [facility] visits prior to 11/16/2022.  Exercise Vital Sign  Days of Exercise  per Week: 0 days  Minutes of Exercise per Session: 0 min  Stress: Stress Concern Present (11/17/2020)  Received from [facility] visits prior to 11/16/2022.  [employer] of Occupational Health - Occupational Stress Questionnaire  Feeling of Stress : To some extent  Social Connections: Moderately Isolated (11/17/2020)  Received from [facility] visits prior to 11/16/2022.  Social Advertising account executive [NHANES]  Frequency of Communication with Friends and Family: More than three times a week  Frequency of Social Gatherings with Friends and Family: Patient refused  Attends Religious Services: Never  Database administrator or Organizations: No  Attends Banker Meetings: Patient refused  Marital  Status: Married  Housing Stability: Unknown (01/05/2024)  Housing Stability Vital Sign  Homeless in the Last Year: No   Objective:   Vitals:  01/26/24 0916  PainSc: 4  PainLoc: Rectum   There is no height or weight on file to calculate BMI.  Head: Normocephalic and atraumatic.  Eyes: Conjunctivae are normal. Pupils are equal, round, and reactive to light. No scleral icterus.  Neck: Normal range of motion. Neck supple. No tracheal deviation present. No thyromegaly present.  Resp: No respiratory distress, normal effort. Abd: Abdomen is soft, non distended and non tender. No masses are palpable. There is no rebound and no guarding. No costovertebral angle tenderness. Midline scar well healed without hernia.  Neurological: Alert and oriented to person, place, and time. Coordination normal.  Skin: Skin is warm and dry. No rash noted. No diaphoretic. No erythema. No pallor. Numerous neurofibromas Psychiatric: Normal mood and affect. Normal behavior. Judgment and thought content normal.   Labs, Imaging and Diagnostic Testing:  Labs 01/19/24 Hct 32.0 Platelets 362 CMET with glucose 119, otherwise normal.   Assessment and Plan:   There are no diagnoses linked to this encounter.  Assessment & Plan Malignant neoplasm of small intestine with met to liver and peritoneal disease GIST of small intestine with recent growth causing pain due to proximity to diaphragm and liver.  Assessment & Plan  Malignant neoplasm of breast Estrogen receptor-positive breast cancer previously treated with no current evidence of active disease. Complex oncological history impacts treatment decisions. - Continue monitoring for recurrence. - Maintain current oncological management plan.  Constipation Constipation possibly related to medication use. Inconsistent Senokot use. - Instruct to take Senokot daily. - Add Miralax  daily. - Increase dietary fiber intake.  Gastrointestinal stromal tumor  (GIST) Scheduled for surgery to debulk tumors in the upper abdomen, focusing on spleen, stomach, and liver. Concerns about potential metastasis to external iliac lymph nodes, but not prioritized. Gleevec  discontinued due to inefficacy; oncology considering restarting ivermectin post-surgery. Main risks include bleeding, infection, bile leak, blood clot, and 1-2% mortality. Splenic vaccines administered. Gastric leak, delayed gastric emptying - Proceed with scheduled surgery to debulk tumors, focusing on spleen, stomach, and liver. - Consider resuming ivermectin post-surgery. - Monitor for bile leaks and signs of infection post-surgery. - splenic vaccines are up to date. - Bowel prep.   Risk of blood loss during surgery Significant concern for blood loss, especially with liver resection. ICU admission post-surgery for monitoring. - Monitor blood loss closely during surgery. - Prepare for potential blood transfusion if necessary. - Admit to ICU post-surgery for monitoring.  Risk of bile leak post liver resection Potential risk of bile leak post liver resection, which can lead to infection but typically heals on its own. - Monitor for signs of bile leak post-surgery. - Consult  GI for stenting if bile leak is detected.  Risk of blood clot post-surgery - Increased risk of blood clots post-surgery due to recent cessation of tamoxifen  and Gleevec . - Monitor for signs of blood clots post-surgery. - Ensure tamoxifen  and Gleevec  remain discontinued.  Pain management for surgery -Anticipated postoperative pain due to extensive surgery. Epidural discussed for pain management to reduce narcotic use. - Consult anesthesia for epidural placement for postoperative pain management. - Provide PCA for postoperative pain control.  Follow-up Postoperative follow-up required to monitor recovery and manage complications. Expected hospital stay of approximately one week. - Plan for approximately one week  hospital stay post-surgery.  No follow-ups on file.

## 2024-01-29 NOTE — Transfer of Care (Signed)
 Immediate Anesthesia Transfer of Care Note  Patient: Anne Horton [facility]  Procedure(s) Performed: REMOVAL OF MALIGNANT RETROPERITONEAL MASS SPLENECTOMY ANTRECTOMY CHEST TUBE INSERTION (Bilateral)  Patient Location: PACU  Anesthesia Type:General  Level of Consciousness: awake, oriented, drowsy, patient cooperative, and responds to stimulation  Airway & Oxygen Therapy: Patient Spontanous Breathing and Patient connected to face mask oxygen  Post-op Assessment: Report given to RN, Post -op Vital signs reviewed and stable, Patient moving all extremities X 4, and Patient able to stick tongue midline  Post vital signs: Reviewed and stable  Last Vitals:  Vitals Value Taken Time  BP 106/60 01/29/24 1633  Temp 97.8   Pulse 110 01/29/24 1640  Resp 15 01/29/24 1640  SpO2 100 % 01/29/24 1640  Vitals shown include unfiled device data.  Last Pain:  Vitals:   01/29/24 0755  TempSrc: Oral  PainSc:       Patients Stated Pain Goal: 1 (01/29/24 0700)  Complications: No notable events documented.

## 2024-01-29 NOTE — Anesthesia Procedure Notes (Signed)
 Procedure Name: Intubation Date/Time: 01/29/2024 9:05 AM  Performed by: Ancel Baltimore, RNPre-anesthesia Checklist: Patient identified, Emergency Drugs available, Suction available and Patient being monitored Patient Re-evaluated:Patient Re-evaluated prior to induction Oxygen Delivery Method: Circle system utilized Preoxygenation: Pre-oxygenation with 100% oxygen Induction Type: IV induction Ventilation: Mask ventilation without difficulty Laryngoscope Size: Mac and 3 Grade View: Grade I Tube type: Oral Tube size: 7.0 mm Number of attempts: 1 Airway Equipment and Method: Stylet Placement Confirmation: ETT inserted through vocal cords under direct vision, positive ETCO2 and breath sounds checked- equal and bilateral Secured at: 22 cm Tube secured with: Tape Dental Injury: Teeth and Oropharynx as per pre-operative assessment  Comments: Preformed by SRNA, MDA and CRNA present for procedure

## 2024-01-29 NOTE — Interval H&P Note (Signed)
 History and Physical Interval Note:  01/29/2024 8:43 AM  Anne Horton  has presented today for surgery, with the diagnosis of METASTATIC GI STROMAL TUMOR.  The various methods of treatment have been discussed with the patient and family. After consideration of risks, benefits and other options for treatment, the patient has consented to  Procedure(s) with comments: EXCISION, MASS, RETROPERITONEUM (N/A) - REMOVAL OF MALIGNANT RETROPERITONEAL MASS, 2ND RETOPERITONEAL MASS POSSIBLE SPLENECTOMY POSSIBLE ANTRECTOMY, POSSIBLE PELVIC MASS EXCISION DEBULKING OF STROMAL TUMOR SPLENECTOMY (N/A) as a surgical intervention.  The patient's history has been reviewed, patient examined, no change in status, stable for surgery.  I have reviewed the patient's chart and labs.  Questions were answered to the patient's satisfaction.     Lockie Rima

## 2024-01-29 NOTE — Anesthesia Procedure Notes (Signed)
 Epidural Patient location during procedure: pre-op Start time: 01/29/2024 7:45 AM End time: 01/29/2024 7:48 AM  Staffing Anesthesiologist: Vernadine Golas, MD Performed: anesthesiologist   Preanesthetic Checklist Completed: patient identified, IV checked, risks and benefits discussed, monitors and equipment checked, pre-op evaluation and timeout performed  Epidural Patient position: sitting Prep: DuraPrep and site prepped and draped Patient monitoring: continuous pulse ox, blood pressure and heart rate Approach: midline Location: thoracic (1-12) Injection technique: LOR air  Needle:  Needle type: Tuohy  Needle gauge: 17 G Needle length: 9 cm Needle insertion depth: 4 cm Catheter type: closed end flexible Catheter size: 19 Gauge Catheter at skin depth: 10 cm Test dose: negative and 1.5% lidocaine  with Epi 1:200 K  Assessment Events: blood not aspirated, no cerebrospinal fluid, injection not painful, no injection resistance, no paresthesia and negative IV test  Additional Notes Patient identified. Risks, benefits, and alternatives discussed with patient including but not limited to bleeding, infection, nerve damage, paralysis, failed block, incomplete pain control, headache, blood pressure changes, nausea, vomiting, reactions to medication, itching, and back pain. Confirmed with bedside nurse the patient's most recent platelet count. Confirmed with patient that they are not currently taking any anticoagulation, have any bleeding history, or any family history of bleeding disorders. Patient expressed understanding and wished to proceed. All questions were answered. Sterile technique was used throughout the entire procedure. Please see nursing notes for vital signs.   Crisp LOR on first pass. Test dose was given through epidural catheter and negative prior to continuing to dose epidural or start infusion. Warning signs of high block given to the patient including shortness of breath,  tingling/numbness in hands, complete motor block, or any concerning symptoms with instructions to call for help. Patient was given instructions on fall risk. All questions and concerns addressed with instructions to call with any issues or inadequate analgesia.  Reason for block:procedure for pain

## 2024-01-29 NOTE — Op Note (Signed)
 PRE-OPERATIVE DIAGNOSIS: recurrent metastatic GI stromal tumor  POST-OPERATIVE DIAGNOSIS:  Same  PROCEDURE:  Procedure(s): Exploratory laparotomy, debulking of metastatic GI stromal tumors with splenectomy, antrectomy with billroth II anastamosis, partial right hepatectomy, resection of peritoneal metastatic lesions including 3 cm pelvic mass, fulguration of diaphragmatic lesions with argon, placement of bilateral tube thoracostomy tubes, intraoperative ultrasound  SURGEON:  Surgeon(s): Lockie Rima, MD  Assistant:   Karleen Overall, MD  ANESTHESIA:   epidural and general  DRAINS: bilateral 14 Fr Chest Tube(s) in the left and right chest and (bilateral 19 Fr) Blake drain(s) in the subdiphragmatic space on the right at the cut surface of the liver and on the left at the former splenic hilum   LOCAL MEDICATIONS USED:  NONE  SPECIMEN:  Source of Specimen:  spleen with perisplenic tumor, peritoneal nodules, antrum of stomach, right liver with mass   DISPOSITION OF SPECIMEN:  PATHOLOGY  COUNTS:  YES  DICTATION: .Dragon Dictation  PLAN OF CARE: Admit to inpatient   PATIENT DISPOSITION:  PACU - hemodynamically stable.  FINDINGS:  many peritoneal and diaphragmatic nodules (50-100), large liver mass, moderate splenic mass.  Spleen and liver masses both adherent to diaphragm, pelvic nodule along sigmoid colon  EBL: 1250 mL  PROCEDURE:  Patient was identified in the holding area and taken the operating room where she was placed supine on the operating room table.  General endotracheal anesthesia was induced.  Her abdomen was prepped and draped in the standard fashion.  Timeout was performed according to the surgical safety checklist.  When all was correct, we continued.    An upper midline incision was made from the xiphoid down to just above the umbilicus.  The subcutaneous tissues were divided with the cautery.  The fascia was also opened with the cautery.  The peritoneum was entered in the  upper portion of the midline where the patient did not have any previous scar tissue.  The fascia was then opened the length of the skin incision with the cautery.  Adhesions were taken down with a combination of sharp dissection and cautery as appropriate.  There were multiple peritoneal nodules noted.  Some of these were able to be resected but some of these were quite flat.  Once the fascia had been opened up to the umbilicus, it was noted that this was a bit too small of incision to be able to do what we need to do.  Because of the posterior right hepatic mass, the incision was opened up in a reverse L type incision laterally toward the right.  The skin was opened with the #10 blade and the subcutaneous tissues and abdominal musculature were opened with the cautery.  The argon was used on the muscles to help with hemostasis.  The Bookwalter retractor was used to assist with visualization.    The left upper quadrant was addressed first.  The short gastrics were taken down with the harmonic scalpel.  The splenic mass was identified and was densely adherent to the diaphragm and the inferior aspect of the left lateral segment.  This was taken down but was extremely fragile.  Despite careful dissection the diaphragm was entered and the mass was disrupted.  The splenic hilum was doubly clamped and divided.  The splenic vessels were suture-ligated.  The spleen was taken off of the diaphragm along with the mass en bloc.    The gastric mass was identified in the antrum as noted in the scan.  The antrum was mobilized.  The  gastroepiploic vessels were taken down on either side of the stomach.  The antrum was stapled off with the gastrointestinal stapler.  The antrum was passed off.  This was not anastomosed until later to avoid tension on the anastomosis.    Attention was then directed to the liver.  The right liver was mobilized.  The mass was taken off of the gerota's fascia.  The mass was adherent to the adrenal  and a portion was that this was taken with the mass.  Care was taken to avoid the right renal vein, the adrenal vein, and the vena cava.  The mass was also adherent to the diaphragm and as we were coming around portion of the diaphragm was also entered on this side.  The right side of the diaphragm also was noted to have numerous peritoneal nodules present.  There was very little normal hepatic parenchyma overlying the mass.  Ultrasound was used to better evaluate the liver vasculature near the tumor.  Once the mass was completely freed up off the retroperitoneum care was taken to make sure that we had everything that we needed to proceed with resection and that anesthesia was caught up.  We make sure we had blood in the room.  A 0-0 chromic suture was placed as a stay suture in the right liver overlying the tumor.  This was used to help retract laterally.  The liver capsule was scored.  The harmonic scalpel was used to divide the superficial liver.  The clamp crush technique was then used to identify the small liver veins and biliary radicles.  These were clamped with small metal clips.  Once we got down more centrally for the larger vascular pedicles, the Echelon stapler was used.  Once got down to the more central portal and hepatic vein structures, the ultrasound was used again to identify the structures to make sure that the main portal vein was away from the dissection and that we were only dividing the posterior branch to the portion of liver in the specimen.  Care was taken to make sure there was adequate inflow and outflow of the remaining liver.  There were 2 very small superficial biliary radicles that appeared to be leaking and these were repaired with 4-0 Prolene sutures.  There was some bleeding once the specimen was removed that was repaired with 3-0 and 4-0 Prolene.  The argon was used for the more peripheral bleeding.  Pressure was also held on the liver.  Once this was performed the liver was  hemostatic.  It was packed away for the remaining abdomen to be evaluated.    There had been noted to be several pelvic nodules on the pre op CT.  There were 2 that were palpated.  1 was able to be removed intact and the other 1 was able to be removed in pieces.  As many nodules as were able to be removed were taken, but the ones on the diaphragm had too large of a footprint to be able to take without leaving the diaphragm with multiple holes.    At this point, the small intestine was run so that an appropriate location for a Billroth II anastomosis could be identified.  This was complicated by her previous small bowel resection and anastomosis.  Previously, her small bowel resection and anastomosis was created not too distal from her ligament of Treitz.  This previous anastomosis was identified around 20 cm past the LOT.  This was freed up and a retrogastric antecolic  anastomosis was made with a single load of the GIA stapler The defect was closed with two running 3-0 PDS in Walton fashion.  The NG tube was passed through the anastomosis prior to closure.  3-0 silk was used to relieve tension fashion on both ends of the anastomosis.  The diaphragm was closed on both sides of the abdomen with interrupted 2-0 Prolene mattress sutures reinforced with pledgets.  Foley catheter and Valsalva technique was used to aspirate as much air and fluid as possible from the chest.  However, decision was made given that there was probably some blood in the chest to go ahead and place a pigtail catheter on each side postoperatively.  The abdomen was irrigated with water  irrigation and the argon catheter was used to fulgurate all peritoneal nodules and diaphragmatic nodules that could be located.  There were several small nodules on the bowel wall that were not fulgurated or resected.  These were on the order of several millimeters in size.  Nodules in the omentum were removed as located.  JP drains were placed on both sides  and the subdiaphragmatic space and secured in the lateral abdominal walls.  The fascia was closed using running #1 PDS suture.  The reverse L portion was closed in 2 layers.  The skin was irrigated.  The skin was then reapproximated with skin staples.  The wounds were then cleaned, dried, and dressed with honeycomb dressings.  The drains were dressed with Biopatches and Tegaderms.  Pigtail catheters were then placed using Seldinger technique after sterile prep.  The left side was addressed first.  Then the right side was dressed.  These were both hooked up to Pleur-evac containers.  The patient was then allowed to emerge from anesthesia and taken the PACU in stable condition

## 2024-01-29 NOTE — Anesthesia Procedure Notes (Addendum)
 Arterial Line Insertion Start/End5/15/2025 7:27 AM, 01/29/2024 7:30 AM Performed by: Vernadine Golas, MD, anesthesiologist  Patient location: Pre-op. Preanesthetic checklist: patient identified, IV checked, site marked, risks and benefits discussed, surgical consent, monitors and equipment checked, pre-op evaluation, timeout performed and anesthesia consent Lidocaine  1% used for infiltration Right, radial was placed Catheter size: 20 G Hand hygiene performed  and maximum sterile barriers used   Attempts: 2 Procedure performed using ultrasound guided technique. Ultrasound Notes:anatomy identified, needle tip was noted to be adjacent to the nerve/plexus identified, no ultrasound evidence of intravascular and/or intraneural injection and image(s) printed for medical record Following insertion, dressing applied. Post procedure assessment: normal and unchanged  Post procedure complications: unsuccessful attempts and second provider assisted. Patient tolerated the procedure well with no immediate complications. Additional procedure comments: One attempt by SRNA unsuccessful. One attempt by MD with ultrasound. Jarrell Merritts, MD.

## 2024-01-30 ENCOUNTER — Encounter (HOSPITAL_COMMUNITY): Payer: Self-pay | Admitting: General Surgery

## 2024-01-30 ENCOUNTER — Inpatient Hospital Stay (HOSPITAL_COMMUNITY)

## 2024-01-30 LAB — POCT I-STAT 7, (LYTES, BLD GAS, ICA,H+H)
Acid-Base Excess: 0 mmol/L (ref 0.0–2.0)
Acid-base deficit: 3 mmol/L — ABNORMAL HIGH (ref 0.0–2.0)
Acid-base deficit: 6 mmol/L — ABNORMAL HIGH (ref 0.0–2.0)
Bicarbonate: 24.5 mmol/L (ref 20.0–28.0)
Bicarbonate: 22.1 mmol/L (ref 20.0–28.0)
Bicarbonate: 19.3 mmol/L — ABNORMAL LOW (ref 20.0–28.0)
Calcium, Ion: 1.09 mmol/L — ABNORMAL LOW (ref 1.15–1.40)
Calcium, Ion: 1.12 mmol/L — ABNORMAL LOW (ref 1.15–1.40)
Calcium, Ion: 1.16 mmol/L (ref 1.15–1.40)
HCT: 24 % — ABNORMAL LOW (ref 36.0–46.0)
HCT: 22 % — ABNORMAL LOW (ref 36.0–46.0)
HCT: 15 % — ABNORMAL LOW (ref 36.0–46.0)
Hemoglobin: 8.2 g/dL — ABNORMAL LOW (ref 12.0–15.0)
Hemoglobin: 7.5 g/dL — ABNORMAL LOW (ref 12.0–15.0)
Hemoglobin: 5.1 g/dL — CL (ref 12.0–15.0)
O2 Saturation: 100 %
O2 Saturation: 100 %
O2 Saturation: 100 %
Patient temperature: 35.6
Patient temperature: 36.6
Patient temperature: 36.9
Potassium: 3 mmol/L — ABNORMAL LOW (ref 3.5–5.1)
Potassium: 3.3 mmol/L — ABNORMAL LOW (ref 3.5–5.1)
Potassium: 3.2 mmol/L — ABNORMAL LOW (ref 3.5–5.1)
Sodium: 141 mmol/L (ref 135–145)
Sodium: 142 mmol/L (ref 135–145)
Sodium: 143 mmol/L (ref 135–145)
TCO2: 26 mmol/L (ref 22–32)
TCO2: 23 mmol/L (ref 22–32)
TCO2: 20 mmol/L — ABNORMAL LOW (ref 22–32)
pCO2 arterial: 36.9 mmHg (ref 32–48)
pCO2 arterial: 39.5 mmHg (ref 32–48)
pCO2 arterial: 35.9 mmHg (ref 32–48)
pH, Arterial: 7.425 (ref 7.35–7.45)
pH, Arterial: 7.354 (ref 7.35–7.45)
pH, Arterial: 7.339 — ABNORMAL LOW (ref 7.35–7.45)
pO2, Arterial: 234 mmHg — ABNORMAL HIGH (ref 83–108)
pO2, Arterial: 261 mmHg — ABNORMAL HIGH (ref 83–108)
pO2, Arterial: 262 mmHg — ABNORMAL HIGH (ref 83–108)

## 2024-01-30 LAB — CBC
HCT: 27.2 % — ABNORMAL LOW (ref 36.0–46.0)
Hemoglobin: 9.3 g/dL — ABNORMAL LOW (ref 12.0–15.0)
MCH: 29.7 pg (ref 26.0–34.0)
MCHC: 34.2 g/dL (ref 30.0–36.0)
MCV: 86.9 fL (ref 80.0–100.0)
Platelets: 126 10*3/uL — ABNORMAL LOW (ref 150–400)
RBC: 3.13 MIL/uL — ABNORMAL LOW (ref 3.87–5.11)
RDW: 14.6 % (ref 11.5–15.5)
WBC: 16.3 10*3/uL — ABNORMAL HIGH (ref 4.0–10.5)
nRBC: 0 % (ref 0.0–0.2)

## 2024-01-30 LAB — PREPARE FRESH FROZEN PLASMA

## 2024-01-30 LAB — POCT I-STAT, CHEM 8
BUN: 5 mg/dL — ABNORMAL LOW (ref 6–20)
BUN: 5 mg/dL — ABNORMAL LOW (ref 6–20)
Calcium, Ion: 1.07 mmol/L — ABNORMAL LOW (ref 1.15–1.40)
Calcium, Ion: 1.21 mmol/L (ref 1.15–1.40)
Chloride: 108 mmol/L (ref 98–111)
Chloride: 108 mmol/L (ref 98–111)
Creatinine, Ser: 0.6 mg/dL (ref 0.44–1.00)
Creatinine, Ser: 0.6 mg/dL (ref 0.44–1.00)
Glucose, Bld: 224 mg/dL — ABNORMAL HIGH (ref 70–99)
Glucose, Bld: 243 mg/dL — ABNORMAL HIGH (ref 70–99)
HCT: 19 % — ABNORMAL LOW (ref 36.0–46.0)
HCT: 28 % — ABNORMAL LOW (ref 36.0–46.0)
Hemoglobin: 6.5 g/dL — CL (ref 12.0–15.0)
Hemoglobin: 9.5 g/dL — ABNORMAL LOW (ref 12.0–15.0)
Potassium: 3.6 mmol/L (ref 3.5–5.1)
Potassium: 3.9 mmol/L (ref 3.5–5.1)
Sodium: 143 mmol/L (ref 135–145)
Sodium: 139 mmol/L (ref 135–145)
TCO2: 21 mmol/L — ABNORMAL LOW (ref 22–32)
TCO2: 20 mmol/L — ABNORMAL LOW (ref 22–32)

## 2024-01-30 LAB — COMPREHENSIVE METABOLIC PANEL WITH GFR
ALT: 74 U/L — ABNORMAL HIGH (ref 0–44)
AST: 209 U/L — ABNORMAL HIGH (ref 15–41)
Albumin: 3.6 g/dL (ref 3.5–5.0)
Alkaline Phosphatase: 23 U/L — ABNORMAL LOW (ref 38–126)
Anion gap: 6 (ref 5–15)
BUN: 8 mg/dL (ref 6–20)
CO2: 24 mmol/L (ref 22–32)
Calcium: 7.9 mg/dL — ABNORMAL LOW (ref 8.9–10.3)
Chloride: 109 mmol/L (ref 98–111)
Creatinine, Ser: 0.79 mg/dL (ref 0.44–1.00)
GFR, Estimated: >60 mL/min (ref >60)
Glucose, Bld: 179 mg/dL — ABNORMAL HIGH (ref 70–99)
Potassium: 4.4 mmol/L (ref 3.5–5.1)
Sodium: 139 mmol/L (ref 135–145)
Total Bilirubin: 1.2 mg/dL (ref 0.0–1.2)
Total Protein: 4.8 g/dL — ABNORMAL LOW (ref 6.5–8.1)

## 2024-01-30 LAB — BPAM CRYOPRECIPITATE
Blood Product Expiration Date: 202505202359
ISSUE DATE / TIME: 202505151400
Unit Type and Rh: 6200

## 2024-01-30 LAB — BPAM FFP
Blood Product Expiration Date: 202505192359
ISSUE DATE / TIME: 202505151330
Unit Type and Rh: 6200

## 2024-01-30 LAB — PREPARE CRYOPRECIPITATE: Unit division: 0

## 2024-01-30 LAB — MAGNESIUM: Magnesium: 1.6 mg/dL — ABNORMAL LOW (ref 1.7–2.4)

## 2024-01-30 LAB — PHOSPHORUS: Phosphorus: 2.6 mg/dL (ref 2.5–4.6)

## 2024-01-30 MED ORDER — VENLAFAXINE HCL ER 75 MG PO CP24
75.0000 mg | ORAL_CAPSULE | Freq: Every day | ORAL | Status: DC
  Administered 2024-02-01 – 2024-02-19 (×18): 75 mg via ORAL
  Filled 2024-01-30 (×19): qty 1

## 2024-01-30 NOTE — Evaluation (Signed)
 Physical Therapy Evaluation Patient Details Name: Anne Horton MRN: [id number] DOB: [birth year] Today's Date: 01/30/2024  History of Present Illness  Pt is a 48 y.o. female who presented 01/29/24 for recurrent GI stromal tumor. S/p exploratory laparotomy, debulking of metastatic GI stromal tumors with splenectomy, antrectomy with billroth II anastamosis, partial right hepatectomy, resection of peritoneal metastatic lesions including 3 cm pelvic mass, fulguration of diaphragmatic lesions with argon, placement of bilateral tube thoracostomy tubes, intraoperative ultrasound 5/15. PMH: anemia, anxiety, breast cancer, depression, dysrhythmia, fibromyalgia, mitral regurgitation, neurofibromatosis   Clinical Impression  Pt presents with condition above and deficits mentioned below, see PT Problem List. PTA, she was independent without AD, driving, and working as a Diplomatic Services operational officer at [employer]. She was living with her husband in a mobile home with 6 STE. Currently, pt is limited by abdominal and back pain and her BP, particularly her MAP, dropping, see below. She is currently requiring up to minA for bed mobility, transfers, and taking a few steps with HHA. She is a bit shaky in the legs when standing, but she will likely progress well and quickly with continued mobility. Therefore, do not anticipate a need for post-acute PT but if she does not progress back/near to her baseline as anticipated then she may benefit from OPPT. Will continue to follow acutely.    BP:  111/56 (72) supine 106/44 (62) sitting 101/42 (57) sitting after transferring bed to chair 99/43 (61) sitting upright several minutes after transfer 117/49 (65) reclined end of session     If plan is discharge home, recommend the following: A little help with walking and/or transfers;A little help with bathing/dressing/bathroom;Assistance with cooking/housework;Assist for transportation;Help with stairs or ramp for entrance   Can  travel by private vehicle        Equipment Recommendations None recommended by PT  Recommendations for Other Services  OT consult    Functional Status Assessment Patient has had a recent decline in their functional status and demonstrates the ability to make significant improvements in function in a reasonable and predictable amount of time.     Precautions / Restrictions Precautions Precautions: Fall;Other (comment) Recall of Precautions/Restrictions: Intact Precaution/Restrictions Comments: abdominal precautions, abdominal binder, NG tube, x2 JP drains, x2 chest tubes, PCA, epidural Restrictions Weight Bearing Restrictions Per Provider Order: No      Mobility  Bed Mobility Overal bed mobility: Needs Assistance Bed Mobility: Rolling, Sidelying to Sit Rolling: Contact guard assist, Min assist, Used rails Sidelying to sit: Min assist, Used rails, HOB elevated       General bed mobility comments: Pt cued to flex legs and roll like a log bil to place abdominal binder, minA the first rep but pt quickly progressed to CGA with subsequent rolling reps using bed rails. Cues provided for pt to bring legs off L EOB and push up on elbow to ascend trunk, minA needed at hips and trunk to sit up and pivot to EOB.    Transfers Overall transfer level: Needs assistance Equipment used: 1 person hand held assist Transfers: Sit to/from Stand, Bed to chair/wheelchair/BSC Sit to Stand: Min assist   Step pivot transfers: Min assist       General transfer comment: Pt holding onto therapist's arms for support and needing minA to power up to stand and maintain stability when taking pivotal steps to L from EOB to recliner, no LOB but noted unsteadiness in legs.    Ambulation/Gait Ambulation/Gait assistance: Min assist Gait Distance (Feet): 3 Feet Assistive device:  1 person hand held assist Gait Pattern/deviations: Step-through pattern, Decreased step length - right, Decreased step length -  left, Decreased stride length Gait velocity: reduced Gait velocity interpretation: <1.31 ft/sec, indicative of household ambulator   General Gait Details: Pt took slow, small steps from bed to recliner with HHA, demonstrating instability in her legs but no LOB, minA for balance. Distance limited by BP  Stairs            Wheelchair Mobility     Tilt Bed    Modified Rankin (Stroke Patients Only)       Balance Overall balance assessment: Needs assistance Sitting-balance support: Single extremity supported, Bilateral upper extremity supported, Feet supported Sitting balance-Leahy Scale: Fair Sitting balance - Comments: sits statically EOB with supervision for safety   Standing balance support: Bilateral upper extremity supported, During functional activity Standing balance-Leahy Scale: Poor Standing balance comment: reliant on UE support and up to minA for balance                             Pertinent Vitals/Pain Pain Assessment Pain Assessment: Faces Faces Pain Scale: Hurts even more Pain Location: abdomen, back Pain Descriptors / Indicators: Discomfort, Grimacing, Guarding, Operative site guarding Pain Intervention(s): Limited activity within patient's tolerance, Monitored during session, Repositioned, PCA encouraged    Home Living Family/patient expects to be discharged to:: Private residence Living Arrangements: Spouse/significant other Available Help at Discharge: Family;Available 24 hours/day Type of Home: Mobile home Home Access: Stairs to enter Entrance Stairs-Rails: Right;Left Entrance Stairs-Number of Steps: 6   Home Layout: One level Home Equipment: Shower seat;Standard Environmental consultant      Prior Function Prior Level of Function : Independent/Modified Independent;Working/employed;Driving             Mobility Comments: No AD ADLs Comments: Works as a Diplomatic Services operational officer at [employer].     Extremity/Trunk Assessment   Upper Extremity  Assessment Upper Extremity Assessment: Defer to OT evaluation    Lower Extremity Assessment Lower Extremity Assessment: Overall WFL for tasks assessed (denied numbness/tingling bil; grossly >/= 4+/5 MMT bil)    Cervical / Trunk Assessment Cervical / Trunk Assessment: Other exceptions Cervical / Trunk Exceptions: abdominal incisions  Communication   Communication Communication: No apparent difficulties    Cognition Arousal: Alert Behavior During Therapy: WFL for tasks assessed/performed   PT - Cognitive impairments: No apparent impairments                         Following commands: Intact       Cueing Cueing Techniques: Verbal cues, Tactile cues     General Comments General comments (skin integrity, edema, etc.): BP: 111/56 (72) supine, 106/44 (62) sitting, 101/42 (57) sitting after transferring bed to chair, 99/43 (61) sitting upright several minutes after transfer, 117/49 (65) reclined end of session    Exercises     Assessment/Plan    PT Assessment Patient needs continued PT services  PT Problem List Decreased activity tolerance;Decreased balance;Decreased mobility;Decreased knowledge of precautions;Decreased skin integrity;Pain       PT Treatment Interventions DME instruction;Gait training;Stair training;Functional mobility training;Therapeutic activities;Therapeutic exercise;Balance training;Neuromuscular re-education;Patient/family education    PT Goals (Current goals can be found in the Care Plan section)  Acute Rehab PT Goals Patient Stated Goal: to improve PT Goal Formulation: With patient/family Time For Goal Achievement: 02/13/24 Potential to Achieve Goals: Good    Frequency Min 2X/week     Co-evaluation  AM-PAC PT "6 Clicks" Mobility  Outcome Measure Help needed turning from your back to your side while in a flat bed without using bedrails?: A Little Help needed moving from lying on your back to sitting on the side of  a flat bed without using bedrails?: A Little Help needed moving to and from a bed to a chair (including a wheelchair)?: A Little Help needed standing up from a chair using your arms (e.g., wheelchair or bedside chair)?: A Little Help needed to walk in hospital room?: Total (<20 ft) Help needed climbing 3-5 steps with a railing? : Total 6 Click Score: 14    End of Session Equipment Utilized During Treatment: Oxygen Activity Tolerance: Patient tolerated treatment well;Other (comment) (limited by BP dropping) Patient left: in chair;with call bell/phone within reach;with chair alarm set;with family/visitor present Nurse Communication: Mobility status;Other (comment) (BP) PT Visit Diagnosis: Unsteadiness on feet (R26.81);Other abnormalities of gait and mobility (R26.89);Difficulty in walking, not elsewhere classified (R26.2);Pain Pain - Right/Left:  (abdomen, back) Pain - part of body:  (abdomen, back)    Time: 1610-9604 PT Time Calculation (min) (ACUTE ONLY): 44 min   Charges:   PT Evaluation $PT Eval Moderate Complexity: 1 Mod PT Treatments $Therapeutic Activity: 23-37 mins PT General Charges $$ ACUTE PT VISIT: 1 Visit         Vernida Goodie, PT, DPT Acute Rehabilitation Services  Office: [phone]   Ellyn Hack 01/30/2024, 2:17 PM

## 2024-01-30 NOTE — Plan of Care (Signed)

## 2024-01-30 NOTE — Anesthesia Post-op Follow-up Note (Signed)
  Anesthesia Pain Follow-up Note  Patient: Anne Horton  Day #: 1  Date of Follow-up: 01/30/2024 Time: 1:23 PM  Last Vitals:  Vitals:   01/30/24 1202 01/30/24 1204  BP:    Pulse:    Resp:  15  Temp: 37 C   SpO2:  98%    Level of Consciousness: alert  Pain: mild   Side Effects:None  Catheter Site Exam:clean, dry, no drainage  Anti-Coag Meds (From admission, onward)    Start     Dose/Rate Route Frequency Ordered Stop   01/30/24 0600  heparin  injection 5,000 Units       Note to Pharmacy: Discussed with Dr. Jarrell Merritts of anesthesia ok to start sq heparin  with epidural tomorrow AM.   5,000 Units Subcutaneous Every 8 hours 01/29/24 1839        Epidural / Intrathecal (From admission, onward)    Start     Dose/Rate Route Frequency Ordered Stop   01/29/24 1945  ropivacaine (PF) 2 mg/mL (0.2%) (NAROPIN) injection        8 mL/hr 8 mL/hr  Epidural Continuous 01/29/24 1907          Plan: Continue current therapy of postop epidural at surgeon's request  Erin Havers

## 2024-01-30 NOTE — Progress Notes (Signed)
   01/30/24 1002  Spiritual Encounters  Type of Visit Initial  Care provided to: Pt and family  Conversation partners present during encounter Nurse  Reason for visit Routine spiritual support  OnCall Visit No   Reason for Visit: Chaplain responding a call from chaplain teammate regarding a surgical patient here who is a [employer] employee in the [employer] [facility].  Description of Visit: Upon arrival in chaplain found Pt in the bed, an RN adjusting medications in the IV, and Pt's husband, Jearlean Mince, at bedside.   Chaplain facilitated story telling and the Pt briefly shared her cancer journey thus far, culminating in the surgery she had yesterday.  Pt indicated that physically she is feeling rough, but emotionally she has experienced a release of much of the anxiety she was carrying prior to the surgery.   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  Pt and husband shared their deep family connections to health care and to [facility] specifically.  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  Pt relies on husband's support in these things.  Pt asked that chaplain would provide the ritual of prayer and chaplain obliged.  Chaplain will return later in the day with a prayer shawl.  Plan of Care: No further spiritual interventions planned at this time  Chaplain services remain available by Spiritual Consult or for emergent cases, paging [phone]  Chaplain Dewitte Foreman, MDiv [email or web address] [phone]

## 2024-01-30 NOTE — Anesthesia Postprocedure Evaluation (Signed)
 Anesthesia Post Note  Patient: Anne Horton  Procedure(s) Performed: REMOVAL OF MALIGNANT RETROPERITONEAL MASS SPLENECTOMY ANTRECTOMY CHEST TUBE INSERTION (Bilateral)     Patient location during evaluation: PACU Anesthesia Type: General Level of consciousness: awake and alert Pain management: pain level controlled Vital Signs Assessment: post-procedure vital signs reviewed and stable Respiratory status: spontaneous breathing, nonlabored ventilation, respiratory function stable and patient connected to nasal cannula oxygen Cardiovascular status: blood pressure returned to baseline and stable Postop Assessment: no apparent nausea or vomiting Anesthetic complications: no   No notable events documented.  Last Vitals:  Vitals:   01/30/24 1900 01/30/24 2000  BP: (!) 149/69   Pulse: (!) 125   Resp: (!) 21   Temp:  37.8 C  SpO2: 99%     Last Pain:  Vitals:   01/30/24 2000  TempSrc: Axillary  PainSc:                  Valente Gaskin Desmond Szabo

## 2024-01-30 NOTE — TOC CM/SW Note (Signed)
 Transition of Care [facility]) - Inpatient Brief Assessment   Patient Details  Name: Anne Horton MRN: [id number] Date of Birth: 02/17/1976  Transition of [facility]) CM/SW Contact:    Blessing Zaucha M, RN Phone Number: 01/30/2024, 5:00 PM   Clinical Narrative: Pt is a 48 y.o. female who presented 01/29/24 for recurrent GI stromal tumor. S/p exploratory laparotomy, debulking of metastatic GI stromal tumors with splenectomy, antrectomy with billroth II anastamosis, partial right hepatectomy, resection of peritoneal metastatic lesions including 3 cm pelvic mass, fulguration of diaphragmatic lesions with argon, placement of bilateral tube thoracostomy tubes, intraoperative ultrasound 5/15.  Patient independent PTA and living with spouse. Please consult TOC as discharge needs arise.    Transition of Care Asessment: Insurance and Status: Insurance coverage has been reviewed Patient has primary care physician: Yes (Dr. Francisco Irving) Home environment has been reviewed: Lives with spouse Prior level of function:: Independent Prior/Current Home Services: No current home services Social Drivers of Health Review: SDOH reviewed needs interventions Readmission risk has been reviewed: Yes Transition of care needs: no transition of care needs at this time  Calla Catchings, RN, BSN  Trauma/Neuro ICU Case Manager [phone]

## 2024-01-30 NOTE — Progress Notes (Signed)
 1 Day Post-Op   Subjective/Chief Complaint: Patient currently with pain 6 out of 10.  Has been using the Dilaudid  PCA of about 4 times every 4 hours.  Epidural does seem to be working.  Is off all Neo-Synephrine.  Urine output is adequate.  Denies nausea and vomiting   Objective: Vital signs in last 24 hours: Temp:  [96.8 F (36 C)-98.9 F (37.2 C)] 98.6 F (37 C) (05/16 0757) Pulse Rate:  [87-112] 100 (05/16 0800) Resp:  [4-23] 13 (05/16 0806) BP: (89-116)/(34-66) 108/59 (05/16 0800) SpO2:  [91 %-100 %] 97 % (05/16 0806) Arterial Line BP: (77-163)/(50-154) 144/59 (05/16 0800) Last BM Date :  (PTA)  Intake/Output from previous day: 05/15 0701 - 05/16 0700 In: 9622.9 [I.V.:5051.5; VHQIO:9629; IV Piggyback:2305.5] Out: 3260 [Urine:850; Drains:800; Blood:1250; Chest Tube:360] Intake/Output this shift: Total I/O In: 236 [I.V.:218.7; IV Piggyback:17.3] Out: 200 [Drains:200]  General:  alert and oriented. Looks sore Resp:  breathing comfortably, chest tubes serosang CV:sl tachycardic, regular Abd:  soft, non distended, dressings c/d/I.  Drains serosang.   Ext:  warm, well perfused, no edema.    Lab Results:  Recent Labs    01/29/24 1648 01/30/24 0358  WBC 11.0* 16.3*  HGB 10.6* 9.3*  HCT 31.1* 27.2*  PLT 98* 126*   BMET Recent Labs    01/29/24 1648 01/30/24 0358  NA 140 139  K 4.1 4.4  CL 107 109  CO2 22 24  GLUCOSE 241* 179*  BUN 5* 8  CREATININE 0.76 0.79  CALCIUM 8.1* 7.9*   PT/INR Recent Labs    01/29/24 1648  LABPROT 19.2*  INR 1.6*   ABG Recent Labs    01/29/24 1647  PHART 7.32*  HCO3 24.7    Studies/Results: DG CHEST PORT 1 VIEW Addendum Date: 01/29/2024 ADDENDUM REPORT: 01/29/2024 18:19 ADDENDUM: These results were called by telephone at the time of interpretation on 01/29/2024 at 6:18 pm to provider DR Dorrie Gaudier, who verbally acknowledged these results. Electronically Signed   By: Bobbye Burrow M.D.   On: 01/29/2024 18:19   Result Date:  01/29/2024 CLINICAL DATA:  Chest tube placement EXAM: PORTABLE CHEST 1 VIEW COMPARISON:  12/09/2023 FINDINGS: Single frontal view of the chest demonstrates enteric catheter passing below diaphragm tip excluded by collimation. Bilateral pigtail pleural drainage catheters are seen, overlying the medial aspect of the upper lung zones. There is a trace sub pulmonic left pneumothorax estimated less than 5%. Small right apical pneumothorax is identified with pleural separation measuring 1.4 cm, volume estimated 10-20%. No mediastinal shift or tension effect. Patchy retrocardiac consolidation most consistent with atelectasis. No pleural effusion. No acute bony abnormalities. Postsurgical changes are seen within the upper abdomen, with radiopaque drainage catheters overlying the upper quadrant. Numerous surgical clips are seen overlying the liver. IMPRESSION: 1. Indwelling bilateral chest tubes, with small bilateral pleural effusions right greater than left. Volume estimated 10-20% on the right and less than 5% on the left. No tension effect. 2. Minimal left basilar consolidation consistent with atelectasis. 3. Postsurgical changes within the upper abdomen. These results will be called to the ordering clinician or representative by the Radiologist Assistant, and communication documented in the PACS or [employer]. Electronically Signed: By: Bobbye Burrow M.D. On: 01/29/2024 17:53    Anti-infectives: Anti-infectives (From admission, onward)    Start     Dose/Rate Route Frequency Ordered Stop   01/29/24 2200  ceFAZolin  (ANCEF ) IVPB 2g/100 mL premix        2 g 200 mL/hr over 30  Minutes Intravenous Every 8 hours 01/29/24 1840 01/29/24 2153   01/29/24 0700  ceFAZolin  (ANCEF ) IVPB 2g/100 mL premix        2 g 200 mL/hr over 30 Minutes Intravenous On call to O.R. 01/29/24 0653 01/29/24 1331       Assessment/Plan: Postop day 1  s/p Exploratory laparotomy, debulking of metastatic GI stromal tumors with  splenectomy, antrectomy with billroth II anastamosis, partial right hepatectomy, resection of peritoneal metastatic lesions including 3 cm pelvic mass, fulguration of diaphragmatic lesions with argon, placement of bilateral tube thoracostomy tubes, intraoperative ultrasound 01/29/2024 Anne Horton for recurrent GI stromal tumor Await final pathology  NPO other than ice chips Anticipate DC NG tube tomorrow and starting clears Epidural and Dilaudid  PCA for pain control.  Standing Robaxin  and 48 hours of Ofirmev .  DC Foley catheter tomorrow.  Risk of urinary retention with epidural in place, but risk of urinary infection high enough to warrant attempt to pull. Anesthesia okayed subcu heparin  for VTE prophylaxis. Once epidural ready to come out, will need to hold this for at least 12 hours.  Will work in conjunction with anesthesia.  As patient remains stable, we will plan transfer to [facility] tomorrow.  Placed on IV metoprolol  for beta-blockade for patient's palpitations.  Patient is typically on atenolol and as needed Inderal . Will plan to start Effexor  back tomorrow once patient's NG tube is out. Tamoxifen  is on hold for VTE risk and has been off for 2 weeks  Chest tubes to water  seal today.  I think they are probably putting out too much to pull tomorrow, will wait for the output to come down.  Repeat chest x-ray tomorrow.  Ambulate today and incentive spirometry today.   LOS: 1 day    Anne Horton 01/30/2024

## 2024-01-31 ENCOUNTER — Inpatient Hospital Stay (HOSPITAL_COMMUNITY): Payer: Self-pay | Admitting: Anesthesiology

## 2024-01-31 ENCOUNTER — Inpatient Hospital Stay (HOSPITAL_COMMUNITY)

## 2024-01-31 LAB — COMPREHENSIVE METABOLIC PANEL WITH GFR
ALT: 35 U/L (ref 0–44)
AST: 71 U/L — ABNORMAL HIGH (ref 15–41)
Albumin: 7.5 g/dL — ABNORMAL HIGH (ref 3.5–5.0)
Alkaline Phosphatase: 20 U/L — ABNORMAL LOW (ref 38–126)
Anion gap: 16 — ABNORMAL HIGH (ref 5–15)
BUN: <5 mg/dL — ABNORMAL LOW (ref 6–20)
CO2: 21 mmol/L — ABNORMAL LOW (ref 22–32)
Calcium: 7 mg/dL — ABNORMAL LOW (ref 8.9–10.3)
Chloride: 100 mmol/L (ref 98–111)
Creatinine, Ser: 0.52 mg/dL (ref 0.44–1.00)
GFR, Estimated: >60 mL/min (ref >60)
Glucose, Bld: 122 mg/dL — ABNORMAL HIGH (ref 70–99)
Potassium: 3.2 mmol/L — ABNORMAL LOW (ref 3.5–5.1)
Sodium: 137 mmol/L (ref 135–145)
Total Bilirubin: 1 mg/dL (ref 0.0–1.2)
Total Protein: 7.9 g/dL (ref 6.5–8.1)

## 2024-01-31 LAB — CBC
HCT: 21.3 % — ABNORMAL LOW (ref 36.0–46.0)
HCT: 22.2 % — ABNORMAL LOW (ref 36.0–46.0)
Hemoglobin: 7.2 g/dL — ABNORMAL LOW (ref 12.0–15.0)
Hemoglobin: 7.5 g/dL — ABNORMAL LOW (ref 12.0–15.0)
MCH: 29.6 pg (ref 26.0–34.0)
MCH: 29.8 pg (ref 26.0–34.0)
MCHC: 33.8 g/dL (ref 30.0–36.0)
MCHC: 33.8 g/dL (ref 30.0–36.0)
MCV: 87.7 fL (ref 80.0–100.0)
MCV: 88.1 fL (ref 80.0–100.0)
Platelets: 193 10*3/uL (ref 150–400)
Platelets: 223 10*3/uL (ref 150–400)
RBC: 2.43 MIL/uL — ABNORMAL LOW (ref 3.87–5.11)
RBC: 2.52 MIL/uL — ABNORMAL LOW (ref 3.87–5.11)
RDW: 15.1 % (ref 11.5–15.5)
RDW: 15.2 % (ref 11.5–15.5)
WBC: 16.3 10*3/uL — ABNORMAL HIGH (ref 4.0–10.5)
WBC: 14.8 10*3/uL — ABNORMAL HIGH (ref 4.0–10.5)
nRBC: 0.4 % — ABNORMAL HIGH (ref 0.0–0.2)
nRBC: 0.2 % (ref 0.0–0.2)

## 2024-01-31 LAB — PHOSPHORUS
Phosphorus: <1 mg/dL — CL (ref 2.5–4.6)
Phosphorus: 1.3 mg/dL — ABNORMAL LOW (ref 2.5–4.6)

## 2024-01-31 LAB — MAGNESIUM: Magnesium: 1.5 mg/dL — ABNORMAL LOW (ref 1.7–2.4)

## 2024-01-31 MED ORDER — MAGNESIUM SULFATE 2 GM/50ML IV SOLN
2.0000 g | Freq: Once | INTRAVENOUS | Status: AC
  Administered 2024-01-31: 2 g via INTRAVENOUS
  Filled 2024-01-31: qty 50

## 2024-01-31 MED ORDER — KCL IN DEXTROSE-NACL 20-5-0.45 MEQ/L-%-% IV SOLN
INTRAVENOUS | Status: AC
  Administered 2024-01-31: 100 mL/h via INTRAVENOUS
  Filled 2024-01-31 (×4): qty 1000

## 2024-01-31 MED ORDER — POTASSIUM PHOSPHATES 15 MMOLE/5ML IV SOLN
30.0000 mmol | Freq: Once | INTRAVENOUS | Status: AC
  Administered 2024-01-31: 30 mmol via INTRAVENOUS
  Filled 2024-01-31: qty 10

## 2024-01-31 MED ORDER — METOPROLOL TARTRATE 5 MG/5ML IV SOLN
5.0000 mg | Freq: Once | INTRAVENOUS | Status: DC
  Filled 2024-01-31: qty 5

## 2024-01-31 MED ORDER — ACETAMINOPHEN 10 MG/ML IV SOLN
1000.0000 mg | Freq: Three times a day (TID) | INTRAVENOUS | Status: AC
  Administered 2024-01-31 (×3): 1000 mg via INTRAVENOUS
  Filled 2024-01-31 (×3): qty 100

## 2024-01-31 NOTE — Progress Notes (Signed)
 2 Days Post-Op   Subjective/Chief Complaint: Patient currently with pain 3 out of 10.  Has been using the Dilaudid  PCA of about 4 times every 4 hours.  Epidural does seem to be working.   Urine output is adequate.  Denies nausea and vomiting. Some burping. Min NG output  Had SVT overnight responded to lopressor .   Tmax 101 last night   Objective: Vital signs in last 24 hours: Temp:  [97.8 F (36.6 C)-101 F (38.3 C)] 97.8 F (36.6 C) (05/17 0753) Pulse Rate:  [96-136] 98 (05/17 0800) Resp:  [10-29] 19 (05/17 0947) BP: (99-149)/(43-69) 121/57 (05/17 0800) SpO2:  [95 %-100 %] 97 % (05/17 0800) FiO2 (%):  [21 %] 21 % (05/17 0800) Last BM Date :  (PTA)  Intake/Output from previous day: 05/16 0701 - 05/17 0700 In: 2274.6 [I.V.:1622; IV Piggyback:523.6] Out: 2426 [Urine:1505; Emesis/NG output:50; Drains:631; Chest Tube:240] Intake/Output this shift: Total I/O In: 204.5 [I.V.:188.5; Other:16] Out: -   General:  alert and oriented. Looks sore Resp:  breathing comfortably, chest tubes serosang CV:sl tachycardic, regular Abd:  soft, non distended, dressings c/d/I.  Drains serosang.   Ext:  warm, well perfused, no edema.    Lab Results:  Recent Labs    01/30/24 0358 01/31/24 0726  WBC 16.3* 16.3*  HGB 9.3* 7.2*  HCT 27.2* 21.3*  PLT 126* 193   BMET Recent Labs    01/30/24 0358 01/31/24 0442  NA 139 137  K 4.4 3.2*  CL 109 100  CO2 24 21*  GLUCOSE 179* 122*  BUN 8 <5*  CREATININE 0.79 0.52  CALCIUM  7.9* 7.0*   PT/INR Recent Labs    01/29/24 1648  LABPROT 19.2*  INR 1.6*   ABG Recent Labs    01/29/24 1250 01/29/24 1647  PHART 7.339* 7.32*  HCO3 19.3* 24.7    Studies/Results: DG CHEST PORT 1 VIEW Result Date: 01/31/2024 CLINICAL DATA:  Chest tube. EXAM: PORTABLE CHEST 1 VIEW COMPARISON:  Jan 30, 2024. FINDINGS: Stable cardiomegaly. Endotracheal tube is seen entering stomach. Bilateral chest tubes are noted. Minimal left apical pneumothorax is  unchanged. Small right apical pneumothorax is noted which is enlarged compared to prior exam. Bibasilar subsegmental atelectasis is noted. IMPRESSION: Bilateral chest tubes. Minimal left apical pneumothorax which is stable. Small right apical pneumothorax which is slightly increased compared to prior exam. Bibasilar subsegmental atelectasis. Electronically Signed   By: Rosalene Colon M.D.   On: 01/31/2024 09:06   DG CHEST PORT 1 VIEW Result Date: 01/30/2024 CLINICAL DATA:  Chest tube. EXAM: PORTABLE CHEST 1 VIEW COMPARISON:  Jan 29, 2024. FINDINGS: Stable cardiomegaly. Nasogastric tube is seen entering stomach. Bilateral chest tubes are noted. Small left apical pneumothorax is noted. Bony thorax is unremarkable. IMPRESSION: Bilateral chest tubes are noted. Small left apical pneumothorax is noted. Electronically Signed   By: Rosalene Colon M.D.   On: 01/30/2024 13:03   DG CHEST PORT 1 VIEW Addendum Date: 01/29/2024 ADDENDUM REPORT: 01/29/2024 18:19 ADDENDUM: These results were called by telephone at the time of interpretation on 01/29/2024 at 6:18 pm to provider DR Dorrie Gaudier, who verbally acknowledged these results. Electronically Signed   By: Bobbye Burrow M.D.   On: 01/29/2024 18:19   Result Date: 01/29/2024 CLINICAL DATA:  Chest tube placement EXAM: PORTABLE CHEST 1 VIEW COMPARISON:  12/09/2023 FINDINGS: Single frontal view of the chest demonstrates enteric catheter passing below diaphragm tip excluded by collimation. Bilateral pigtail pleural drainage catheters are seen, overlying the medial aspect of the  upper lung zones. There is a trace sub pulmonic left pneumothorax estimated less than 5%. Small right apical pneumothorax is identified with pleural separation measuring 1.4 cm, volume estimated 10-20%. No mediastinal shift or tension effect. Patchy retrocardiac consolidation most consistent with atelectasis. No pleural effusion. No acute bony abnormalities. Postsurgical changes are seen within the  upper abdomen, with radiopaque drainage catheters overlying the upper quadrant. Numerous surgical clips are seen overlying the liver. IMPRESSION: 1. Indwelling bilateral chest tubes, with small bilateral pleural effusions right greater than left. Volume estimated 10-20% on the right and less than 5% on the left. No tension effect. 2. Minimal left basilar consolidation consistent with atelectasis. 3. Postsurgical changes within the upper abdomen. These results will be called to the ordering clinician or representative by the Radiologist Assistant, and communication documented in the PACS or [employer]. Electronically Signed: By: Bobbye Burrow M.D. On: 01/29/2024 17:53    Anti-infectives: Anti-infectives (From admission, onward)    Start     Dose/Rate Route Frequency Ordered Stop   01/29/24 2200  ceFAZolin  (ANCEF ) IVPB 2g/100 mL premix        2 g 200 mL/hr over 30 Minutes Intravenous Every 8 hours 01/29/24 1840 01/29/24 2153   01/29/24 0700  ceFAZolin  (ANCEF ) IVPB 2g/100 mL premix        2 g 200 mL/hr over 30 Minutes Intravenous On call to O.R. 01/29/24 0653 01/29/24 1331       Assessment/Plan: Postop day 2  s/p Exploratory laparotomy, debulking of metastatic GI stromal tumors with splenectomy, antrectomy with billroth II anastamosis, partial right hepatectomy, resection of peritoneal metastatic lesions including 3 cm pelvic mass, fulguration of diaphragmatic lesions with argon, placement of bilateral tube thoracostomy tubes, intraoperative ultrasound 01/29/2024 [name] for recurrent GI stromal tumor Await final pathology   DC NG tube and starting clears Epidural and Dilaudid  PCA for pain control.  Standing Robaxin  and 48 hours of Ofirmev .  DC Foley catheter today.  Risk of urinary retention with epidural in place, but risk of urinary infection high enough to warrant attempt to pull. Anesthesia okayed subcu heparin  for VTE prophylaxis. Once epidural ready to come out, will need to hold  this for at least 12 hours.  Will work in conjunction with anesthesia.    Placed on IV metoprolol  for beta-blockade for patient's palpitations.  Patient is typically on atenolol and as needed Inderal . Will plan to start Effexor  back tomorrow once patient's NG tube is out. Tamoxifen  is on hold for VTE risk and has been off for 2 weeks  Cont Chest tubes to water  seal. CXR this am R>L ptx.  I think they are probably putting out too much to pull today (rt 190, L 50), will wait for the output to come down.  Repeat chest x-ray tomorrow.  Ambulate today and incentive spirometry today.  Hypokalemia - replace potassium Hypophosphatemia - replace phosphate; repeat level after replacement Hypomagnesia -replace magnesium   VTE prophylaxis - scds, subcu heparin  started 5/16; Once epidural ready to come out, will need to hold this for at least 12 hours.  Will work in conjunction with anesthesia. Acute on chronic anemia - hgb down to 7.2; repeat cbc in am, transfuse for Hgb <7; maintain type and cross; may need to hold subcu; repeat CBC later today at 1600  Updated family at [facility]   LOS: 2 days   Marianna Shirk. Elvan Hamel, MD, FACS General, Bariatric, & Minimally Invasive Surgery [facility],  [facility]  Aldean Hummingbird 01/31/2024

## 2024-01-31 NOTE — Anesthesia Post-op Follow-up Note (Signed)
  Anesthesia Pain Follow-up Note  Patient: Anne Horton [facility]  Day #: 2  Date of Follow-up: 01/31/2024 Time: 11:19 AM  Last Vitals:  Vitals:   01/31/24 0900 01/31/24 0947  BP: (!) 121/53   Pulse: (!) 101   Resp: (!) 22 19  Temp:    SpO2: 97%     Level of Consciousness: alert  Pain: mild   Side Effects:None  Catheter Site Exam:clean, dry, no drainage  Anti-Coag Meds (From admission, onward)    Start     Dose/Rate Route Frequency Ordered Stop   01/30/24 0600  heparin  injection 5,000 Units       Note to Pharmacy: Discussed with Dr. Jarrell Merritts of anesthesia ok to start sq heparin  with epidural tomorrow AM.   5,000 Units Subcutaneous Every 8 hours 01/29/24 1839        Epidural / Intrathecal (From admission, onward)    Start     Dose/Rate Route Frequency Ordered Stop   01/29/24 1945  ropivacaine  (PF) 2 mg/mL (0.2%) (NAROPIN ) injection        8 mL/hr 8 mL/hr  Epidural Continuous 01/29/24 1907          Plan: Continue current therapy of postop epidural at surgeon's request  Keshon Markovitz S

## 2024-01-31 NOTE — Evaluation (Signed)
 Occupational Therapy Evaluation Patient Details Name: Anne Horton MRN: [id number] DOB: Oct 08, 1975 Today's Date: 01/31/2024   History of Present Illness   Pt is a 48 y.o. female who presented 01/29/24 for recurrent GI stromal tumor. S/p exploratory laparotomy, debulking of metastatic GI stromal tumors with splenectomy, antrectomy with billroth II anastamosis, partial right hepatectomy, resection of peritoneal metastatic lesions including 3 cm pelvic mass, fulguration of diaphragmatic lesions with argon, placement of bilateral tube thoracostomy tubes, intraoperative ultrasound 5/15. PMH: anemia, anxiety, breast cancer, depression, dysrhythmia, fibromyalgia, mitral regurgitation, neurofibromatosis     Clinical Impressions Pt is typically independent in ADL and mobility. She works full time, drives, and enjoys her two big dogs (approx 90 lbs). Today despite discomfort and pain she is willing to work with therapy and highly motivated. She demonstrated good recall from previous PT session, and was able to perform rolling, and sidelying to sit with GCA (min A initially) Currently unable to achieve figure 4 access to LB for ADL - will address in future session with AE education/demo. Pt min A for step pivot to recliner. Pt overall is mod A for UB ADL, set up for grooming tasks, max A for LB ADL at this time. SpO2 >90% on RA, BP at end of session 132/61 with legs elevated. OT will continue to follow acutely and currently recommending HHOT post-acute (although she might progress past needing this - will monitor) to maximize safety and independence in ADL and functional transfers. Next session plan to continue OOB, and bring AE kit for education/demo.      If plan is discharge home, recommend the following:   A lot of help with walking and/or transfers;A lot of help with bathing/dressing/bathroom;Assistance with cooking/housework;Assist for transportation;Help with stairs or ramp for entrance      Functional Status Assessment   Patient has had a recent decline in their functional status and demonstrates the ability to make significant improvements in function in a reasonable and predictable amount of time.     Equipment Recommendations   BSC/3in1;Other (comment) (AE kit)     Recommendations for Other Services   PT consult     Precautions/Restrictions   Precautions Precautions: Fall;Other (comment) Recall of Precautions/Restrictions: Intact Precaution/Restrictions Comments: abdominal precautions, abdominal binder, NG tube, x2 JP drains, x2 chest tubes, PCA, epidural Restrictions Weight Bearing Restrictions Per Provider Order: No     Mobility Bed Mobility Overal bed mobility: Needs Assistance Bed Mobility: Rolling, Sidelying to Sit Rolling: Contact guard assist, Min assist, Used rails Sidelying to sit: Min assist, Used rails, HOB elevated       General bed mobility comments: Pt able to complete all bed mobilty with GCA and cues for sequencing    Transfers Overall transfer level: Needs assistance Equipment used: 1 person hand held assist Transfers: Sit to/from Stand, Bed to chair/wheelchair/BSC Sit to Stand: Min assist, Contact guard assist, +2 safety/equipment     Step pivot transfers: Min assist, Contact guard assist, +2 safety/equipment     General transfer comment: Pt holding onto therapist's arms for support and needing steadying minA to power up to stand and maintain stability when taking pivotal steps to L from EOB to recliner, no LOB but noted unsteadiness in legs. +2 essential for line management      Balance Overall balance assessment: Needs assistance Sitting-balance support: Single extremity supported, Bilateral upper extremity supported, Feet supported Sitting balance-Leahy Scale: Fair Sitting balance - Comments: sits statically EOB with supervision for safety   Standing balance support: Bilateral upper  extremity supported, During  functional activity Standing balance-Leahy Scale: Poor Standing balance comment: reliant on UE support and up to minA for balance                           ADL either performed or assessed with clinical judgement   ADL Overall ADL's : Needs assistance/impaired Eating/Feeding: NPO   Grooming: Wash/dry face;Set up;Sitting Grooming Details (indicate cue type and reason): in recliner Upper Body Bathing: Moderate assistance;Sitting   Lower Body Bathing: Moderate assistance Lower Body Bathing Details (indicate cue type and reason): knees down Upper Body Dressing : Moderate assistance;Sitting   Lower Body Dressing: Maximal assistance;Sitting/lateral leans;Sit to/from stand Lower Body Dressing Details (indicate cue type and reason): cannot perform figure 4 currently Toilet Transfer: Contact guard assist;Stand-pivot Toilet Transfer Details (indicate cue type and reason): 1 person HHA Toileting- Clothing Manipulation and Hygiene: Moderate assistance       Functional mobility during ADLs: Contact guard assist;+2 for safety/equipment (pivot to recliner only today) General ADL Comments: decreased access to LB for ADL, decreased balance, activity tolerance     Vision Baseline Vision/History: 1 Wears glasses Ability to See in Adequate Light: 0 Adequate Patient Visual Report: No change from baseline Vision Assessment?: No apparent visual deficits     Perception         Praxis         Pertinent Vitals/Pain Pain Assessment Pain Assessment: Faces Faces Pain Scale: Hurts even more Pain Location: abdomen, back Pain Descriptors / Indicators: Discomfort, Grimacing, Guarding, Operative site guarding Pain Intervention(s): Monitored during session, Repositioned, PCA encouraged     Extremity/Trunk Assessment Upper Extremity Assessment Upper Extremity Assessment: Overall WFL for tasks assessed;Generalized weakness (BUE edema, encouraged elevation and functional use)   Lower  Extremity Assessment Lower Extremity Assessment: Defer to PT evaluation   Cervical / Trunk Assessment Cervical / Trunk Assessment: Other exceptions Cervical / Trunk Exceptions: abdominal incisions   Communication Communication Communication: No apparent difficulties   Cognition Arousal: Alert Behavior During Therapy: WFL for tasks assessed/performed, Flat affect Cognition: No apparent impairments                               Following commands: Intact       Cueing  General Comments   Cueing Techniques: Verbal cues;Tactile cues  BP up in recliner after transfer 132/61 RR up to 24 with pivot, HR in the 120's throughout   Exercises     Shoulder Instructions      Home Living Family/patient expects to be discharged to:: Private residence Living Arrangements: Spouse/significant other Available Help at Discharge: Family;Available 24 hours/day Type of Home: Mobile home Home Access: Stairs to enter Entrance Stairs-Number of Steps: 6 Entrance Stairs-Rails: Right;Left Home Layout: One level     Bathroom Shower/Tub: Producer, television/film/video: Standard     Home Equipment: Shower seat;Standard Environmental consultant          Prior Functioning/Environment Prior Level of Function : Independent/Modified Independent;Working/employed;Driving             Mobility Comments: No AD ADLs Comments: Works as a Diplomatic Services operational officer at [employer].    OT Problem List: Decreased strength;Decreased range of motion;Decreased activity tolerance;Impaired balance (sitting and/or standing);Decreased knowledge of use of DME or AE;Decreased knowledge of precautions;Cardiopulmonary status limiting activity;Pain;Increased edema   OT Treatment/Interventions: Self-care/ADL training;Energy conservation;DME and/or AE instruction;Therapeutic activities;Patient/family education;Balance training  OT Goals(Current goals can be found in the care plan section)   Acute Rehab OT  Goals Patient Stated Goal: get back to work OT Goal Formulation: With patient Time For Goal Achievement: 02/14/24 Potential to Achieve Goals: Good ADL Goals Pt Will Perform Grooming: with supervision;standing Pt Will Perform Upper Body Bathing: sitting;with adaptive equipment;with supervision Pt Will Perform Lower Body Bathing: with set-up;with adaptive equipment;sitting/lateral leans Pt Will Perform Upper Body Dressing: with modified independence;sitting Pt Will Perform Lower Body Dressing: with contact guard assist;sit to/from stand;with adaptive equipment Pt Will Transfer to Toilet: with modified independence;ambulating Pt Will Perform Toileting - Clothing Manipulation and hygiene: with set-up;sitting/lateral leans;sit to/from stand   OT Frequency:  Min 2X/week    Co-evaluation              AM-PAC OT "6 Clicks" Daily Activity     Outcome Measure Help from another person eating meals?: Total (NPO) Help from another person taking care of personal grooming?: A Little Help from another person toileting, which includes using toliet, bedpan, or urinal?: A Lot Help from another person bathing (including washing, rinsing, drying)?: A Lot Help from another person to put on and taking off regular upper body clothing?: A Lot Help from another person to put on and taking off regular lower body clothing?: A Lot 6 Click Score: 12   End of Session Equipment Utilized During Treatment: Oxygen;Other (comment) (2L, abdominal binder) Nurse Communication: Mobility status;Precautions  Activity Tolerance: Patient tolerated treatment well Patient left: in chair;with call bell/phone within reach;with chair alarm set;with nursing/sitter in room;with family/visitor present  OT Visit Diagnosis: Unsteadiness on feet (R26.81);Other abnormalities of gait and mobility (R26.89);Muscle weakness (generalized) (M62.81);Pain Pain - part of body:  (abdomen)                Time: 1422-1450 OT Time Calculation  (min): 28 min Charges:  OT General Charges $OT Visit: 1 Visit OT Evaluation $OT Eval High Complexity: 1 High OT Treatments $Self Care/Home Management : 8-22 mins  Chales [place] OTR/L Acute Rehabilitation Services Office: [phone]  Ebony Goldstein [facility] 01/31/2024, 5:14 PM

## 2024-01-31 NOTE — Plan of Care (Signed)
 Major issues overnight is cardiac- went into SVT vs Afib with RVR HR between 145 and 200. Responded well to metoprolol  5mg  IV.   A bit drowsy overnight. Issue with afib with RVR 140-200 versus ST with runs of SVT Dr. Leighton Punches paged and patient responded well to stat dose metoprolol  5mg  IV.  Pain management- c/o a lot of back pain.  Using incentive spirometer. Not getting nutrition.  WBC 16.3 this AM and febrile.  Patient has a lot of acute nursing problems to resolve.

## 2024-02-01 LAB — POCT I-STAT 7, (LYTES, BLD GAS, ICA,H+H)
Acid-base deficit: 5 mmol/L — ABNORMAL HIGH (ref 0.0–2.0)
Bicarbonate: 20.2 mmol/L (ref 20.0–28.0)
Calcium, Ion: 1.22 mmol/L (ref 1.15–1.40)
HCT: 22 % — ABNORMAL LOW (ref 36.0–46.0)
Hemoglobin: 7.5 g/dL — ABNORMAL LOW (ref 12.0–15.0)
O2 Saturation: 100 %
Patient temperature: 37
Potassium: 3.9 mmol/L (ref 3.5–5.1)
Sodium: 141 mmol/L (ref 135–145)
TCO2: 21 mmol/L — ABNORMAL LOW (ref 22–32)
pCO2 arterial: 38.1 mmHg (ref 32–48)
pH, Arterial: 7.332 — ABNORMAL LOW (ref 7.35–7.45)
pO2, Arterial: 281 mmHg — ABNORMAL HIGH (ref 83–108)

## 2024-02-01 LAB — COMPREHENSIVE METABOLIC PANEL WITH GFR
ALT: 34 U/L (ref 0–44)
AST: 51 U/L — ABNORMAL HIGH (ref 15–41)
Albumin: 3.5 g/dL (ref 3.5–5.0)
Alkaline Phosphatase: 30 U/L — ABNORMAL LOW (ref 38–126)
Anion gap: 9 (ref 5–15)
BUN: <5 mg/dL — ABNORMAL LOW (ref 6–20)
CO2: 24 mmol/L (ref 22–32)
Calcium: 8.1 mg/dL — ABNORMAL LOW (ref 8.9–10.3)
Chloride: 110 mmol/L (ref 98–111)
Creatinine, Ser: 0.52 mg/dL (ref 0.44–1.00)
GFR, Estimated: >60 mL/min (ref >60)
Glucose, Bld: 134 mg/dL — ABNORMAL HIGH (ref 70–99)
Potassium: 4 mmol/L (ref 3.5–5.1)
Sodium: 143 mmol/L (ref 135–145)
Total Bilirubin: 1 mg/dL (ref 0.0–1.2)
Total Protein: 4.9 g/dL — ABNORMAL LOW (ref 6.5–8.1)

## 2024-02-01 LAB — CBC
HCT: 19.7 % — ABNORMAL LOW (ref 36.0–46.0)
Hemoglobin: 6.7 g/dL — CL (ref 12.0–15.0)
MCH: 30.2 pg (ref 26.0–34.0)
MCHC: 34 g/dL (ref 30.0–36.0)
MCV: 88.7 fL (ref 80.0–100.0)
Platelets: 257 10*3/uL (ref 150–400)
RBC: 2.22 MIL/uL — ABNORMAL LOW (ref 3.87–5.11)
RDW: 15.5 % (ref 11.5–15.5)
WBC: 16 10*3/uL — ABNORMAL HIGH (ref 4.0–10.5)
nRBC: 0.2 % (ref 0.0–0.2)

## 2024-02-01 LAB — MAGNESIUM: Magnesium: 2.1 mg/dL (ref 1.7–2.4)

## 2024-02-01 LAB — HEMOGLOBIN AND HEMATOCRIT, BLOOD
HCT: 26.3 % — ABNORMAL LOW (ref 36.0–46.0)
Hemoglobin: 8.8 g/dL — ABNORMAL LOW (ref 12.0–15.0)

## 2024-02-01 LAB — PHOSPHORUS: Phosphorus: 2.2 mg/dL — ABNORMAL LOW (ref 2.5–4.6)

## 2024-02-01 LAB — PREPARE RBC (CROSSMATCH)

## 2024-02-01 MED ORDER — DIPHENHYDRAMINE HCL 50 MG/ML IJ SOLN
12.5000 mg | Freq: Once | INTRAMUSCULAR | Status: AC
  Administered 2024-02-01: 12.5 mg via INTRAVENOUS
  Filled 2024-02-01: qty 1

## 2024-02-01 MED ORDER — HYDROMORPHONE HCL 1 MG/ML IJ SOLN
0.5000 mg | INTRAMUSCULAR | Status: DC | PRN
  Administered 2024-02-01 – 2024-02-10 (×5): 1 mg via INTRAVENOUS
  Administered 2024-02-10: 0.5 mg via INTRAVENOUS
  Filled 2024-02-01 (×7): qty 1

## 2024-02-01 MED ORDER — SODIUM CHLORIDE 0.9% IV SOLUTION: Freq: Once | INTRAVENOUS | Status: DC

## 2024-02-01 MED ORDER — OXYCODONE HCL 5 MG PO TABS
5.0000 mg | ORAL_TABLET | ORAL | Status: DC | PRN
  Administered 2024-02-01 – 2024-02-02 (×3): 10 mg via ORAL
  Administered 2024-02-03 (×2): 5 mg via ORAL
  Administered 2024-02-03 – 2024-02-05 (×8): 10 mg via ORAL
  Administered 2024-02-05: 5 mg via ORAL
  Administered 2024-02-05 – 2024-02-07 (×4): 10 mg via ORAL
  Administered 2024-02-07 (×2): 5 mg via ORAL
  Administered 2024-02-07: 10 mg via ORAL
  Administered 2024-02-08: 5 mg via ORAL
  Administered 2024-02-08: 10 mg via ORAL
  Administered 2024-02-08 (×2): 5 mg via ORAL
  Filled 2024-02-01 (×2): qty 1
  Filled 2024-02-01 (×10): qty 2
  Filled 2024-02-01 (×2): qty 1
  Filled 2024-02-01: qty 2
  Filled 2024-02-01: qty 1
  Filled 2024-02-01 (×2): qty 2
  Filled 2024-02-01: qty 1
  Filled 2024-02-01: qty 2
  Filled 2024-02-01: qty 1
  Filled 2024-02-01 (×3): qty 2
  Filled 2024-02-01: qty 1
  Filled 2024-02-01 (×3): qty 2

## 2024-02-01 MED ORDER — BOOST / RESOURCE BREEZE PO LIQD CUSTOM
1.0000 | Freq: Two times a day (BID) | ORAL | Status: DC
  Administered 2024-02-01 – 2024-02-11 (×14): 1 via ORAL
  Filled 2024-02-01: qty 1

## 2024-02-01 MED ORDER — METOPROLOL SUCCINATE ER 50 MG PO TB24
50.0000 mg | ORAL_TABLET | Freq: Every day | ORAL | Status: DC
  Administered 2024-02-01 – 2024-02-04 (×4): 50 mg via ORAL
  Filled 2024-02-01 (×4): qty 1

## 2024-02-01 MED ORDER — ACETAMINOPHEN 500 MG PO TABS
1000.0000 mg | ORAL_TABLET | Freq: Four times a day (QID) | ORAL | Status: AC
Start: 2024-02-01 — End: ?
  Administered 2024-02-01 – 2024-02-12 (×35): 1000 mg via ORAL
  Filled 2024-02-01 (×38): qty 2

## 2024-02-01 MED ORDER — METHOCARBAMOL 500 MG PO TABS: 750.0000 mg | ORAL_TABLET | Freq: Three times a day (TID) | ORAL | Status: DC | PRN

## 2024-02-01 NOTE — Progress Notes (Signed)
 3 Days Post-Op   Subjective/Chief Complaint: CT output picked up possibly because she got out of bed yesterday.  She tolerated clears but is burping and belching.  No flatus.  No significant nausea.  Family member at bedside.  Pain is controlled.  Hemoglobin drifted down to below 7 this morning.   Objective: Vital signs in last 24 hours: Temp:  [98.3 F (36.8 C)-99.2 F (37.3 C)] 99.1 F (37.3 C) (05/18 0915) Pulse Rate:  [88-126] 101 (05/18 0915) Resp:  [9-35] 9 (05/18 0918) BP: (91-148)/(43-79) 131/64 (05/18 0915) SpO2:  [88 %-99 %] 91 % (05/18 0915) FiO2 (%):  [22 %-28 %] 28 % (05/18 0359) Last BM Date :  (pta)  Intake/Output from previous day: 05/17 0701 - 05/18 0700 In: 3749 [I.V.:2331.2; IV Piggyback:1217.8] Out: 1825 [Urine:650; Drains:415; Chest Tube:760] Intake/Output this shift: Total I/O In: 8 [Other:8] Out: 30 [Drains:30]  General:  alert and oriented. Looks sore Resp:  breathing comfortably, chest tubes serosang CV:sl tachycardic, regular Abd:  soft, non distended, dressings c/d/I.  Drains serosang.   Ext:  warm, well perfused, no edema.    Lab Results:  Recent Labs    01/31/24 1550 02/01/24 0618  WBC 14.8* 16.0*  HGB 7.5* 6.7*  HCT 22.2* 19.7*  PLT 223 257   BMET Recent Labs    01/30/24 0358 01/31/24 0442  NA 139 137  K 4.4 3.2*  CL 109 100  CO2 24 21*  GLUCOSE 179* 122*  BUN 8 <5*  CREATININE 0.79 0.52  CALCIUM  7.9* 7.0*   PT/INR Recent Labs    01/29/24 1648  LABPROT 19.2*  INR 1.6*   ABG Recent Labs    01/29/24 1445 01/29/24 1647  PHART 7.332* 7.32*  HCO3 20.2 24.7    Studies/Results: DG CHEST PORT 1 VIEW Result Date: 01/31/2024 CLINICAL DATA:  Chest tube. EXAM: PORTABLE CHEST 1 VIEW COMPARISON:  Jan 30, 2024. FINDINGS: Stable cardiomegaly. Endotracheal tube is seen entering stomach. Bilateral chest tubes are noted. Minimal left apical pneumothorax is unchanged. Small right apical pneumothorax is noted which is enlarged  compared to prior exam. Bibasilar subsegmental atelectasis is noted. IMPRESSION: Bilateral chest tubes. Minimal left apical pneumothorax which is stable. Small right apical pneumothorax which is slightly increased compared to prior exam. Bibasilar subsegmental atelectasis. Electronically Signed   By: Rosalene Colon M.D.   On: 01/31/2024 09:06    Anti-infectives: Anti-infectives (From admission, onward)    Start     Dose/Rate Route Frequency Ordered Stop   01/29/24 2200  ceFAZolin  (ANCEF ) IVPB 2g/100 mL premix        2 g 200 mL/hr over 30 Minutes Intravenous Every 8 hours 01/29/24 1840 01/29/24 2153   01/29/24 0700  ceFAZolin  (ANCEF ) IVPB 2g/100 mL premix        2 g 200 mL/hr over 30 Minutes Intravenous On call to O.R. 01/29/24 0653 01/29/24 1331       Assessment/Plan: Postop day 3 s/p Exploratory laparotomy, debulking of metastatic GI stromal tumors with splenectomy, antrectomy with billroth II anastamosis, partial right hepatectomy, resection of peritoneal metastatic lesions including 3 cm pelvic mass, fulguration of diaphragmatic lesions with argon, placement of bilateral tube thoracostomy tubes, intraoperative ultrasound 01/29/2024 [name] for recurrent GI stromal tumor Await final pathology  Stay on clear liquid diet since having some burping and belching.  Will add protein shakes however  Foley-out, no retention  DC Foley catheter today.  Risk of urinary retention with epidural in place, but risk of urinary infection high  enough to warrant attempt to pull.  Pain control - will cont epidural today; likely remove Monday am, Anesthesia okayed subcu heparin  for VTE prophylaxis. Once epidural ready to come out, will need to hold this for at least 12 hours.  Will work in conjunction with anesthesia. Dc PCA, start po pain meds, scheduled tylenol /robaxin ; prn dilaudid      Placed on IV metoprolol  for beta-blockade for patient's palpitations.  Patient is typically on atenolol and as needed  Inderal . Restart those meds  restart Effexor   Tamoxifen  is on hold for VTE risk and has been off for 2 weeks  Cont Chest tubes to water  seal. CXR this am R>L ptx.  I think they are probably putting out too much to pull today (rt 360, L 400), will wait for the output to come down.  Repeat chest x-ray tomorrow.  Ambulate today and incentive spirometry today.  FEN-morning labs are pending, clear liquid diet plus protein shakes  VTE prophylaxis - scds, subcu heparin  started 5/16; Once epidural ready to come out, will need to hold this for at least 12 hours.  Will work in conjunction with anesthesia.;  Hemoglobin drifted down this morning.  Will stop subcu heparin .  Acute on chronic anemia - hgb down to 6.7; transfuse 1 unit PRBCs, check posttransfusion hemoglobin, repeat CBC in morning, transfuse for Hgb <7; maintain type and cross; will stop subcu heparin   Updated family at [facility]   LOS: 3 days   Marianna Shirk. Elvan Hamel, MD, FACS General, Bariatric, & Minimally Invasive Surgery [facility],  [facility]  Aldean Hummingbird 02/01/2024

## 2024-02-01 NOTE — Anesthesia Post-op Follow-up Note (Signed)
  Anesthesia Pain Follow-up Note  Patient: Anne Horton [facility]  Day #: 3  Date of Follow-up: 02/01/2024 Time: 3:41 PM  Last Vitals:  Vitals:   02/01/24 1400 02/01/24 1500  BP: (!) 136/55 (!) 117/56  Pulse: (!) 125 (!) 109  Resp: (!) 31 16  Temp:    SpO2: 92% 92%    Level of Consciousness: alert  Pain: mild   Side Effects:None  Catheter Site Exam:clean, dry, no drainage  Epidural / Intrathecal (From admission, onward)    Start     Dose/Rate Route Frequency Ordered Stop   01/29/24 1945  ropivacaine  (PF) 2 mg/mL (0.2%) (NAROPIN ) injection        8 mL/hr 8 mL/hr  Epidural Continuous 01/29/24 1907          Plan: Continue current therapy of postop epidural at surgeon's request  Theotis Flake P Archita Lomeli

## 2024-02-02 ENCOUNTER — Inpatient Hospital Stay (HOSPITAL_COMMUNITY)

## 2024-02-02 LAB — TYPE AND SCREEN
ABO/RH(D): A POS
Antibody Screen: NEGATIVE
Unit division: 0
Unit division: 0
Unit division: 0
Unit division: 0
Unit division: 0
Unit division: 0
Unit division: 0
Unit division: 0

## 2024-02-02 LAB — CBC
HCT: 33.6 % — ABNORMAL LOW (ref 36.0–46.0)
Hemoglobin: 10.8 g/dL — ABNORMAL LOW (ref 12.0–15.0)
MCH: 29.5 pg (ref 26.0–34.0)
MCHC: 32.1 g/dL (ref 30.0–36.0)
MCV: 91.8 fL (ref 80.0–100.0)
Platelets: 405 10*3/uL — ABNORMAL HIGH (ref 150–400)
RBC: 3.66 MIL/uL — ABNORMAL LOW (ref 3.87–5.11)
RDW: 15.4 % (ref 11.5–15.5)
WBC: 16.6 10*3/uL — ABNORMAL HIGH (ref 4.0–10.5)
nRBC: 1.9 % — ABNORMAL HIGH (ref 0.0–0.2)

## 2024-02-02 LAB — BPAM RBC
Blood Product Expiration Date: 202506162359
Blood Product Expiration Date: 202506172359
Blood Product Expiration Date: 202506172359
Blood Product Expiration Date: 202506172359
Blood Product Expiration Date: 202506172359
Blood Product Expiration Date: 202506172359
Blood Product Expiration Date: 202506172359
ISSUE DATE / TIME: 202505150912
ISSUE DATE / TIME: 202505150912
ISSUE DATE / TIME: 202505151211
ISSUE DATE / TIME: 202505151211
ISSUE DATE / TIME: 202505151331
ISSUE DATE / TIME: 202505151331
ISSUE DATE / TIME: 202505180845
ISSUE DATE / TIME: 202505191034
ISSUE DATE / TIME: 202506172359
Unit Type and Rh: 202506172359
Unit Type and Rh: 202506172359
Unit Type and Rh: 6200
Unit Type and Rh: 6200
Unit Type and Rh: 6200
Unit Type and Rh: 6200
Unit Type and Rh: 6200
Unit Type and Rh: 6200
Unit Type and Rh: 6200
Unit Type and Rh: 6200

## 2024-02-02 LAB — COMPREHENSIVE METABOLIC PANEL WITH GFR
ALT: 36 U/L (ref 0–44)
AST: 38 U/L (ref 15–41)
Albumin: 3.8 g/dL (ref 3.5–5.0)
Alkaline Phosphatase: 55 U/L (ref 38–126)
Anion gap: 10 (ref 5–15)
BUN: 6 mg/dL (ref 6–20)
CO2: 23 mmol/L (ref 22–32)
Calcium: 8.9 mg/dL (ref 8.9–10.3)
Chloride: 104 mmol/L (ref 98–111)
Creatinine, Ser: 0.54 mg/dL (ref 0.44–1.00)
GFR, Estimated: >60 mL/min (ref >60)
Glucose, Bld: 88 mg/dL (ref 70–99)
Potassium: 4.6 mmol/L (ref 3.5–5.1)
Sodium: 137 mmol/L (ref 135–145)
Total Bilirubin: 1.4 mg/dL — ABNORMAL HIGH (ref 0.0–1.2)
Total Protein: 6.1 g/dL — ABNORMAL LOW (ref 6.5–8.1)

## 2024-02-02 LAB — MAGNESIUM: Magnesium: 2 mg/dL (ref 1.7–2.4)

## 2024-02-02 LAB — PHOSPHORUS: Phosphorus: 2.8 mg/dL (ref 2.5–4.6)

## 2024-02-02 LAB — SURGICAL PATHOLOGY

## 2024-02-02 NOTE — Anesthesia Post-op Follow-up Note (Addendum)
  Anesthesia Pain Follow-up Note  Patient: Allyssa Abruzzese [facility]  Day #: 4  Date of Follow-up: 02/02/2024 Time: 12:08 PM  Last Vitals:  Vitals:   02/02/24 0900 02/02/24 1000  BP: 118/85 135/62  Pulse: 100 94  Resp: (!) 24 (!) 22  Temp:    SpO2: 95% 93%    Level of Consciousness: alert  Pain: mild   Side Effects:None  Catheter Site Exam:clean, dry, no drainage  Epidural / Intrathecal (From admission, onward)    Start     Dose/Rate Route Frequency Ordered Stop   01/29/24 1945  ropivacaine  (PF) 2 mg/mL (0.2%) (NAROPIN ) injection        8 mL/hr 8 mL/hr  Epidural Continuous 01/29/24 1907          Plan: Continue current therapy of postop epidural at surgeon's request  Earvin Goldberg

## 2024-02-02 NOTE — Progress Notes (Signed)
 Occupational Therapy Treatment Patient Details Name: Anne Horton MRN: [id number] DOB: 05-21-1976 Today's Date: 02/02/2024   History of present illness Pt is a 48 y.o. female who presented 01/29/24 for recurrent GI stromal tumor. S/p exploratory laparotomy, debulking of metastatic GI stromal tumors with splenectomy, antrectomy with billroth II anastamosis, partial right hepatectomy, resection of peritoneal metastatic lesions including 3 cm pelvic mass, fulguration of diaphragmatic lesions with argon, placement of bilateral tube thoracostomy tubes, intraoperative ultrasound 5/15. PMH: anemia, anxiety, breast cancer, depression, dysrhythmia, fibromyalgia, mitral regurgitation, neurofibromatosis   OT comments  Pt progressing toward established OT goals. Challenged activity tolerance this session. Pt able to perform modified figure 4 for LB ADL this session, donning socks with min A. Pt needing up to CGA for functional mobility with RW, supervision for standing grooming tasks, and CGA for toileting. Pt HR up to 138 with mobility, but denies dizziness. BP soft at end of session with RN aware. Will continue to follow acutely.       If plan is discharge home, recommend the following:  Assistance with cooking/housework;Assist for transportation;Help with stairs or ramp for entrance;A little help with walking and/or transfers;A little help with bathing/dressing/bathroom   Equipment Recommendations  BSC/3in1;Other (comment) (AE kit)    Recommendations for Other Services      Precautions / Restrictions Precautions Precautions: Fall;Other (comment) Recall of Precautions/Restrictions: Intact Precaution/Restrictions Comments: abdominal precautions, abdominal binder, x2 JP drains, x2 chest tubes, PCA, epidural Restrictions Weight Bearing Restrictions Per Provider Order: No       Mobility Bed Mobility Overal bed mobility: Needs Assistance             General bed mobility comments: pt OOB at  start and end of session    Transfers Overall transfer level: Needs assistance Equipment used: Rolling walker (2 wheels) Transfers: Sit to/from Stand, Bed to chair/wheelchair/BSC Sit to Stand: Min assist, Contact guard assist     Step pivot transfers: Contact guard assist     General transfer comment: pt able to rise from recliner and toilet without assist, did use armrest and grab bar. then CGA with RW     Balance Overall balance assessment: Needs assistance Sitting-balance support: Single extremity supported, Bilateral upper extremity supported, Feet supported Sitting balance-Leahy Scale: Fair Sitting balance - Comments: sits statically EOB with supervision for safety   Standing balance support: Bilateral upper extremity supported, During functional activity Standing balance-Leahy Scale: Poor Standing balance comment: reliant on UE support                           ADL either performed or assessed with clinical judgement   ADL Overall ADL's : Needs assistance/impaired     Grooming: Oral care;Contact guard assist;Standing Grooming Details (indicate cue type and reason): at sink             Lower Body Dressing: Minimal assistance;Sitting/lateral leans Lower Body Dressing Details (indicate cue type and reason): for socks from chair position. Toilet Transfer: Contact guard assist;Supervision/safety;Ambulation;Rolling walker (2 wheels) Toilet Transfer Details (indicate cue type and reason): CGA approaching supervision Toileting- Clothing Manipulation and Hygiene: Contact guard assist;Sitting/lateral lean       Functional mobility during ADLs: Supervision/safety;Contact guard assist;Rolling walker (2 wheels)      Extremity/Trunk Assessment Upper Extremity Assessment Upper Extremity Assessment: Overall WFL for tasks assessed (using BUE functionally this session.)   Lower Extremity Assessment Lower Extremity Assessment: Defer to PT evaluation  Vision   Vision Assessment?: No apparent visual deficits   Perception     Praxis     Communication Communication Communication: No apparent difficulties   Cognition Arousal: Alert Behavior During Therapy: WFL for tasks assessed/performed Cognition: No apparent impairments             OT - Cognition Comments: follows commands, has fair insight into safety stating "do I need to don my abd binder", and with good use of RW                 Following commands: Intact        Cueing   Cueing Techniques: Verbal cues, Tactile cues  Exercises      Shoulder Instructions       General Comments HR 138 max during functional mobility and ADL at sink; BP 92/79 (85) at end of session, but pt denies dizziness.    Pertinent Vitals/ Pain       Pain Assessment Pain Assessment: 0-10 Pain Score: 6  Faces Pain Scale: Hurts little more Pain Location: abdomen, upper R shoulder on back from 3-4 with mobility to 5-6 after Pain Descriptors / Indicators: Discomfort, Grimacing, Guarding, Operative site guarding Pain Intervention(s): Limited activity within patient's tolerance  Home Living                                          Prior Functioning/Environment              Frequency  Min 2X/week        Progress Toward Goals  OT Goals(current goals can now be found in the care plan section)  Progress towards OT goals: Progressing toward goals  Acute Rehab OT Goals Patient Stated Goal: get better OT Goal Formulation: With patient Time For Goal Achievement: 02/14/24 Potential to Achieve Goals: Good ADL Goals Pt Will Perform Grooming: with supervision;standing Pt Will Perform Upper Body Bathing: sitting;with adaptive equipment;with supervision Pt Will Perform Lower Body Bathing: with set-up;with adaptive equipment;sitting/lateral leans Pt Will Perform Upper Body Dressing: with modified independence;sitting Pt Will Perform Lower Body Dressing: with  contact guard assist;sit to/from stand;with adaptive equipment Pt Will Transfer to Toilet: with modified independence;ambulating Pt Will Perform Toileting - Clothing Manipulation and hygiene: with set-up;sitting/lateral leans;sit to/from stand  Plan      Co-evaluation                 AM-PAC OT "6 Clicks" Daily Activity     Outcome Measure   Help from another person eating meals?: None Help from another person taking care of personal grooming?: A Little Help from another person toileting, which includes using toliet, bedpan, or urinal?: A Little Help from another person bathing (including washing, rinsing, drying)?: A Little Help from another person to put on and taking off regular upper body clothing?: A Little Help from another person to put on and taking off regular lower body clothing?: A Little 6 Click Score: 19    End of Session Equipment Utilized During Treatment: Gait belt;Rolling walker (2 wheels)  OT Visit Diagnosis: Unsteadiness on feet (R26.81);Other abnormalities of gait and mobility (R26.89);Muscle weakness (generalized) (M62.81);Pain   Activity Tolerance Patient tolerated treatment well   Patient Left in chair;with call bell/phone within reach;with chair alarm set;with nursing/sitter in room;with family/visitor present   Nurse Communication Mobility status;Precautions        Time: 0454-0981 OT Time Calculation (  min): 26 min  Charges: OT General Charges $OT Visit: 1 Visit OT Treatments $Self Care/Home Management : 8-22 mins  Karilyn Ouch, OTR/L [facility] Acute Rehabilitation Office: [phone] '  Emery Hans 02/02/2024, 1:26 PM

## 2024-02-02 NOTE — Progress Notes (Signed)
 Physical Therapy Treatment Patient Details Name: Anne Horton MRN: [id number] DOB: 03-19-1976 Today's Date: 02/02/2024   History of Present Illness Pt is a 48 y.o. female who presented 01/29/24 for recurrent GI stromal tumor. S/p exploratory laparotomy, debulking of metastatic GI stromal tumors with splenectomy, antrectomy with billroth II anastamosis, partial right hepatectomy, resection of peritoneal metastatic lesions including 3 cm pelvic mass, fulguration of diaphragmatic lesions with argon, placement of bilateral tube thoracostomy tubes, intraoperative ultrasound 5/15. PMH: anemia, anxiety, breast cancer, depression, dysrhythmia, fibromyalgia, mitral regurgitation, neurofibromatosis    PT Comments  The pt was agreeable to session, reports sitting in chair for ~1.5 hours prior to arrival of PT this morning. The pt was able to complete sit-stand transfers with UE support and CGA, no overt buckling or instability noted this session. The pt was limited by needing assist for line management, but able to greatly progress walking distance in hallway with better stability in vitals this session. Continue to anticipate good progression with activity, especially as lines/tubes able to be removed. Pt is eager to continue with mobility progression, encouraged to ask RN about mobility later in the day.   HR: max 139bpm with gait  BP after gait: 92/79 (85)   If plan is discharge home, recommend the following: A little help with walking and/or transfers;A little help with bathing/dressing/bathroom;Assistance with cooking/housework;Assist for transportation;Help with stairs or ramp for entrance   Can travel by private vehicle        Equipment Recommendations  None recommended by PT    Recommendations for Other Services       Precautions / Restrictions Precautions Precautions: Fall;Other (comment) Recall of Precautions/Restrictions: Intact Precaution/Restrictions Comments: abdominal precautions,  abdominal binder, NG tube, x2 JP drains, x2 chest tubes, PCA, epidural Restrictions Weight Bearing Restrictions Per Provider Order: No     Mobility  Bed Mobility Overal bed mobility: Needs Assistance             General bed mobility comments: pt OOB at start and end of session    Transfers Overall transfer level: Needs assistance Equipment used: Rolling walker (2 wheels) Transfers: Sit to/from Stand, Bed to chair/wheelchair/BSC Sit to Stand: Min assist, Contact guard assist   Step pivot transfers: Contact guard assist       General transfer comment: pt able to rise from recliner and toilet without assist, did use armrest and grab bar. then CGA with RW    Ambulation/Gait Ambulation/Gait assistance: Contact guard assist Gait Distance (Feet): 75 Feet Assistive device: Rolling walker (2 wheels) Gait Pattern/deviations: Step-through pattern, Decreased step length - right, Decreased step length - left, Decreased stride length Gait velocity: reduced Gait velocity interpretation: <1.31 ft/sec, indicative of household ambulator   General Gait Details: slow but steady gait with UE support on RW. CGA with assist for line management only. HR to 139bpm with gait   Stairs             Wheelchair Mobility     Tilt Bed    Modified Rankin (Stroke Patients Only)       Balance Overall balance assessment: Needs assistance Sitting-balance support: Single extremity supported, Bilateral upper extremity supported, Feet supported Sitting balance-Leahy Scale: Fair Sitting balance - Comments: sits statically EOB with supervision for safety   Standing balance support: Bilateral upper extremity supported, During functional activity Standing balance-Leahy Scale: Poor Standing balance comment: reliant on UE support  Communication Communication Communication: No apparent difficulties  Cognition Arousal: Alert Behavior During Therapy: WFL  for tasks assessed/performed   PT - Cognitive impairments: No apparent impairments                         Following commands: Intact      Cueing Cueing Techniques: Verbal cues, Tactile cues     General Comments General comments (skin integrity, edema, etc.): HR to 138bpm with gait, BP soft but MAP stable at 85      Pertinent Vitals/Pain Pain Assessment Pain Assessment: 0-10 Pain Score: 6  Faces Pain Scale: Hurts little more Pain Location: abdomen, upper R shoulder on back from 3-4 with mobility to 5-6 after Pain Descriptors / Indicators: Discomfort, Grimacing, Guarding, Operative site guarding Pain Intervention(s): Limited activity within patient's tolerance, Monitored during session, Repositioned, RN gave pain meds during session     PT Goals (current goals can now be found in the care plan section) Acute Rehab PT Goals Patient Stated Goal: to improve PT Goal Formulation: With patient/family Time For Goal Achievement: 02/13/24 Potential to Achieve Goals: Good Progress towards PT goals: Progressing toward goals    Frequency    Min 2X/week       AM-PAC PT "6 Clicks" Mobility   Outcome Measure  Help needed turning from your back to your side while in a flat bed without using bedrails?: A Little Help needed moving from lying on your back to sitting on the side of a flat bed without using bedrails?: A Little Help needed moving to and from a bed to a chair (including a wheelchair)?: A Little Help needed standing up from a chair using your arms (e.g., wheelchair or bedside chair)?: A Little Help needed to walk in hospital room?: A Little Help needed climbing 3-5 steps with a railing? : Total 6 Click Score: 16    End of Session Equipment Utilized During Treatment: Other (comment) (abdominal binder) Activity Tolerance: Patient tolerated treatment well Patient left: in chair;with call bell/phone within reach;with chair alarm set Nurse Communication: Mobility  status PT Visit Diagnosis: Unsteadiness on feet (R26.81);Other abnormalities of gait and mobility (R26.89);Difficulty in walking, not elsewhere classified (R26.2);Pain Pain - Right/Left: Right (abdomen, back) Pain - part of body: Shoulder (abdomen, back)     Time: 1610-9604 PT Time Calculation (min) (ACUTE ONLY): 25 min  Charges:    $Therapeutic Exercise: 8-22 mins PT General Charges $$ ACUTE PT VISIT: 1 Visit                     Barnabas Booth, PT, DPT   Acute Rehabilitation Department Office [phone] Secure Chat Communication Preferred   Lona Rist 02/02/2024, 9:46 AM

## 2024-02-02 NOTE — Progress Notes (Signed)
 4 Days Post-Op   Subjective/Chief Complaint: Got more blood yesterday.  Felt better.  Still belching and having a lot of reflux.  No flatus yet.  Has been ambulating in hall.  Has taken some oxy, but has only required 1 dose of iv dilaudid .     Objective: Vital signs in last 24 hours: Temp:  [98.6 F (37 C)-99.8 F (37.7 C)] 98.6 F (37 C) (05/19 0800) Pulse Rate:  [85-125] 94 (05/19 1000) Resp:  [13-31] 22 (05/19 1000) BP: (110-149)/(53-85) 135/62 (05/19 1000) SpO2:  [91 %-100 %] 93 % (05/19 1000) Last BM Date :  (pta)  Intake/Output from previous day: 05/18 0701 - 05/19 0700 In: 184  Out: 720 [Drains:270; Chest Tube:450] Intake/Output this shift: Total I/O In: 32 [Other:32] Out: -   General:  alert and oriented.  OOB in chair.   Resp:  breathing comfortably, chest tubes serosang.   CV:sl tachycardic, regular Abd:  soft, non distended, dressings c/d/I.  Drains serosang.   Ext:  warm, well perfused, no edema.    Lab Results:  Recent Labs    02/01/24 0618 02/01/24 1230 02/02/24 0725  WBC 16.0*  --  16.6*  HGB 6.7* 8.8* 10.8*  HCT 19.7* 26.3* 33.6*  PLT 257  --  405*   BMET Recent Labs    02/01/24 0618 02/02/24 0725  NA 143 137  K 4.0 4.6  CL 110 104  CO2 24 23  GLUCOSE 134* 88  BUN <5* 6  CREATININE 0.52 0.54  CALCIUM  8.1* 8.9   PT/INR No results for input(s): "LABPROT", "INR" in the last 72 hours.  ABG No results for input(s): "PHART", "HCO3" in the last 72 hours.  Invalid input(s): "PCO2", "PO2"   Studies/Results: DG CHEST PORT 1 VIEW Result Date: 02/02/2024 CLINICAL DATA:  Pneumothoraces with chest tubes in-situ EXAM: PORTABLE CHEST 1 VIEW COMPARISON:  Chest radiograph dated 01/31/2024 FINDINGS: Lines/tubes: Similar position of bilateral pleural catheters in-situ. Epidural catheter projects over the right hemithorax. Right upper quadrant surgical clips. Left axillary and lateral chest wall surgical clips. Lungs: Slightly low lung volumes.  Increased lung aeration with decreased bibasilar patchy opacities. Pleura: Slightly increased small right pneumothorax and trace left pneumothorax. Trace blunting of bilateral costophrenic angles. Heart/mediastinum: Similar  cardiomediastinal silhouette. Bones: No acute osseous abnormality. IMPRESSION: 1. Slightly increased small right pneumothorax and trace left pneumothorax with bilateral pleural catheters in-situ. 2. Increased lung aeration with decreased bibasilar patchy opacities, likely atelectasis. Electronically Signed   By: Limin  Xu M.D.   On: 02/02/2024 09:39    Anti-infectives: Anti-infectives (From admission, onward)    Start     Dose/Rate Route Frequency Ordered Stop   01/29/24 2200  ceFAZolin  (ANCEF ) IVPB 2g/100 mL premix        2 g 200 mL/hr over 30 Minutes Intravenous Every 8 hours 01/29/24 1840 01/29/24 2153   01/29/24 0700  ceFAZolin  (ANCEF ) IVPB 2g/100 mL premix        2 g 200 mL/hr over 30 Minutes Intravenous On call to O.R. 01/29/24 0653 01/29/24 1331       Assessment/Plan: Postop day 4 s/p Exploratory laparotomy, debulking of metastatic GI stromal tumors with splenectomy, antrectomy with billroth II anastamosis, partial right hepatectomy, resection of peritoneal metastatic lesions including 3 cm pelvic mass, fulguration of diaphragmatic lesions with argon, placement of bilateral tube thoracostomy tubes, intraoperative ultrasound 01/29/2024 Laneisha Mino for recurrent GI stromal tumor Await final pathology  Stay on clear liquid diet since having some burping and belching.  Foley-out, no retention  Pain control - will cont epidural today; epidural out tomorrow.  Discussed with anesthesia. Continue robaxin , standing tylenol . Prn oxy, prn dilaudid .    Placed on IV metoprolol  for beta-blockade for patient's palpitations.  Patient is typically on atenolol and as needed Inderal . Restart those meds  restart Effexor   Tamoxifen  is on hold for VTE risk and has been off for 2  weeks  Cont Chest tubes to water  seal. CXR this am R>L ptx.  If right PTX gets any bigger tomorrow, will need to place back to suction.   Repeat chest x-ray tomorrow.  Ambulate today and incentive spirometry today.  FEN-Na, K, Phos, Mag OK.    VTE prophylaxis - scds, restart sq heparin  post epidural pull tomorrow.   ABL anemia on anemia of chronic dx.  Better today.  Recheck tomorrow.  Looks like equilibrated.      LOS: 4 days   Deliliah Fender, MD, FACS, FSSO Surgical Oncology and General Surgery [facility] Surgery, [place] [phone] for weekday/non holidays Check [email or web address] for coverage night/weekend/holidays    Lockie Rima 02/02/2024

## 2024-02-03 ENCOUNTER — Inpatient Hospital Stay (HOSPITAL_COMMUNITY)

## 2024-02-03 LAB — COMPREHENSIVE METABOLIC PANEL WITH GFR
ALT: 30 U/L (ref 0–44)
AST: 31 U/L (ref 15–41)
Albumin: 3.4 g/dL — ABNORMAL LOW (ref 3.5–5.0)
Alkaline Phosphatase: 63 U/L (ref 38–126)
Anion gap: 17 — ABNORMAL HIGH (ref 5–15)
BUN: 8 mg/dL (ref 6–20)
CO2: 18 mmol/L — ABNORMAL LOW (ref 22–32)
Calcium: 8.6 mg/dL — ABNORMAL LOW (ref 8.9–10.3)
Chloride: 103 mmol/L (ref 98–111)
Creatinine, Ser: 0.66 mg/dL (ref 0.44–1.00)
GFR, Estimated: >60 mL/min (ref >60)
Glucose, Bld: 78 mg/dL (ref 70–99)
Potassium: 4.1 mmol/L (ref 3.5–5.1)
Sodium: 138 mmol/L (ref 135–145)
Total Bilirubin: 1.7 mg/dL — ABNORMAL HIGH (ref 0.0–1.2)
Total Protein: 5.6 g/dL — ABNORMAL LOW (ref 6.5–8.1)

## 2024-02-03 LAB — CBC
HCT: 30.6 % — ABNORMAL LOW (ref 36.0–46.0)
Hemoglobin: 10 g/dL — ABNORMAL LOW (ref 12.0–15.0)
MCH: 29.6 pg (ref 26.0–34.0)
MCHC: 32.7 g/dL (ref 30.0–36.0)
MCV: 90.5 fL (ref 80.0–100.0)
Platelets: 573 10*3/uL — ABNORMAL HIGH (ref 150–400)
RBC: 3.38 MIL/uL — ABNORMAL LOW (ref 3.87–5.11)
RDW: 15.3 % (ref 11.5–15.5)
WBC: 21 10*3/uL — ABNORMAL HIGH (ref 4.0–10.5)
nRBC: 1.9 % — ABNORMAL HIGH (ref 0.0–0.2)

## 2024-02-03 LAB — MAGNESIUM: Magnesium: 2.1 mg/dL (ref 1.7–2.4)

## 2024-02-03 LAB — PHOSPHORUS: Phosphorus: 3.3 mg/dL (ref 2.5–4.6)

## 2024-02-03 MED ORDER — METHOCARBAMOL 500 MG PO TABS: 500.0000 mg | ORAL_TABLET | Freq: Four times a day (QID) | ORAL | Status: DC | PRN

## 2024-02-03 MED ORDER — SUCRALFATE 1 GM/10ML PO SUSP
1.0000 g | Freq: Three times a day (TID) | ORAL | Status: DC
Start: 2024-02-03 — End: 2024-02-05
  Administered 2024-02-03 – 2024-02-05 (×8): 1 g via ORAL
  Filled 2024-02-03 (×12): qty 10

## 2024-02-03 MED ORDER — ENOXAPARIN SODIUM 40 MG/0.4ML IJ SOSY
40.0000 mg | PREFILLED_SYRINGE | INTRAMUSCULAR | Status: DC
  Administered 2024-02-04 – 2024-02-19 (×16): 40 mg via SUBCUTANEOUS
  Filled 2024-02-03 (×16): qty 0.4

## 2024-02-03 MED ORDER — ALPRAZOLAM 0.5 MG PO TABS
0.5000 mg | ORAL_TABLET | Freq: Three times a day (TID) | ORAL | Status: DC | PRN
  Administered 2024-02-03 – 2024-02-12 (×5): 0.5 mg via ORAL
  Filled 2024-02-03 (×5): qty 1

## 2024-02-03 MED ORDER — METHOCARBAMOL 500 MG PO TABS
500.0000 mg | ORAL_TABLET | Freq: Three times a day (TID) | ORAL | Status: DC
  Administered 2024-02-03 – 2024-02-12 (×27): 500 mg via ORAL
  Filled 2024-02-03 (×27): qty 1

## 2024-02-03 NOTE — Progress Notes (Signed)
 Physical Therapy Treatment Patient Details Name: SHAIA PORATH MRN: [id number] DOB: 1976/08/25 Today's Date: 02/03/2024   History of Present Illness Pt is a 48 y.o. female who presented 01/29/24 for recurrent GI stromal tumor. S/p exploratory laparotomy, debulking of metastatic GI stromal tumors with splenectomy, antrectomy with billroth II anastamosis, partial right hepatectomy, resection of peritoneal metastatic lesions including 3 cm pelvic mass, fulguration of diaphragmatic lesions with argon, placement of bilateral tube thoracostomy tubes, intraoperative ultrasound 5/15. PMH: anemia, anxiety, breast cancer, depression, dysrhythmia, fibromyalgia, mitral regurgitation, neurofibromatosis    PT Comments  The pt was agreeable to session, reports pain remained under control after ambulation progression yesterday. Pt again tolerated mobility progression well, max HR lower today than yesterday even with pt progressing ambulation distance. Pt continued to use RW with mobility and needed no standing rest breaks or external support, discussed attempt to ambulate without DME at next session as pt is hopeful to eventually wean from use of DME. Pt also needs stair training (4-5 steps to enter) prior to d/c but is making good progress with mobility at this time.    If plan is discharge home, recommend the following: A little help with walking and/or transfers;A little help with bathing/dressing/bathroom;Assistance with cooking/housework;Assist for transportation;Help with stairs or ramp for entrance   Can travel by private vehicle        Equipment Recommendations  None recommended by PT    Recommendations for Other Services       Precautions / Restrictions Precautions Precautions: Fall;Other (comment) Recall of Precautions/Restrictions: Intact Precaution/Restrictions Comments: abdominal precautions, abdominal binder, R JP drain, R chest tube Restrictions Weight Bearing Restrictions Per Provider  Order: No     Mobility  Bed Mobility Overal bed mobility: Needs Assistance             General bed mobility comments: pt OOB at start and end of session    Transfers Overall transfer level: Needs assistance Equipment used: Rolling walker (2 wheels) Transfers: Sit to/from Stand, Bed to chair/wheelchair/BSC Sit to Stand: Contact guard assist   Step pivot transfers: Contact guard assist       General transfer comment: pt using UE to rise from chair, stable without UE support for static standing    Ambulation/Gait Ambulation/Gait assistance: Contact guard assist Gait Distance (Feet): 200 Feet Assistive device: Rolling walker (2 wheels) Gait Pattern/deviations: Step-through pattern, Decreased stride length, Trunk flexed Gait velocity: reduced     General Gait Details: slow but steady, mild trunk flexion with RW. HR to 133bpm, SpO2 stable on RA     Balance Overall balance assessment: Needs assistance Sitting-balance support: Single extremity supported, Bilateral upper extremity supported, Feet supported Sitting balance-Leahy Scale: Fair Sitting balance - Comments: static sitting without UE support   Standing balance support: Bilateral upper extremity supported, During functional activity Standing balance-Leahy Scale: Poor Standing balance comment: can static stand without UE support, BUE support for gait                            Communication Communication Communication: No apparent difficulties  Cognition Arousal: Alert Behavior During Therapy: WFL for tasks assessed/performed   PT - Cognitive impairments: No apparent impairments                         Following commands: Intact      Cueing Cueing Techniques: Verbal cues, Tactile cues  Exercises      General Comments  General comments (skin integrity, edema, etc.): HR 133bpm with gait, SpO2 stable and BP stable      Pertinent Vitals/Pain Pain Assessment Pain Assessment:  Faces Pain Score: 4  Faces Pain Scale: Hurts little more Pain Location: abdomen, upper R shoulder on back from 3-4 with mobility to 5-6 after Pain Descriptors / Indicators: Discomfort, Grimacing, Guarding, Operative site guarding Pain Intervention(s): Limited activity within patient's tolerance, Monitored during session     PT Goals (current goals can now be found in the care plan section) Acute Rehab PT Goals Patient Stated Goal: to improve PT Goal Formulation: With patient/family Time For Goal Achievement: 02/13/24 Potential to Achieve Goals: Good Progress towards PT goals: Progressing toward goals    Frequency    Min 2X/week       AM-PAC PT "6 Clicks" Mobility   Outcome Measure  Help needed turning from your back to your side while in a flat bed without using bedrails?: A Little Help needed moving from lying on your back to sitting on the side of a flat bed without using bedrails?: A Little Help needed moving to and from a bed to a chair (including a wheelchair)?: A Little Help needed standing up from a chair using your arms (e.g., wheelchair or bedside chair)?: A Little Help needed to walk in hospital room?: A Little Help needed climbing 3-5 steps with a railing? : Total 6 Click Score: 16    End of Session Equipment Utilized During Treatment: Other (comment) (abdominal binder) Activity Tolerance: Patient tolerated treatment well Patient left: in chair;with call bell/phone within reach;with chair alarm set Nurse Communication: Mobility status PT Visit Diagnosis: Unsteadiness on feet (R26.81);Other abnormalities of gait and mobility (R26.89);Difficulty in walking, not elsewhere classified (R26.2);Pain Pain - Right/Left: Right (abdomen, back) Pain - part of body: Shoulder (abdomen, back)     Time: 1610-9604 PT Time Calculation (min) (ACUTE ONLY): 18 min  Charges:    $Therapeutic Exercise: 8-22 mins PT General Charges $$ ACUTE PT VISIT: 1 Visit                      Barnabas Booth, PT, DPT   Acute Rehabilitation Department Office [phone] Secure Chat Communication Preferred   Lona Rist 02/03/2024, 12:20 PM

## 2024-02-03 NOTE — Progress Notes (Signed)
 5 Days Post-Op   Subjective/Chief Complaint: Started passing gas but had some more burning type pain.   Less nausea overall.     Objective: Vital signs in last 24 hours: Temp:  [97.9 F (36.6 C)-98.6 F (37 C)] 97.9 F (36.6 C) (05/20 1600) Pulse Rate:  [90-110] 93 (05/20 1400) Resp:  [18-30] 23 (05/20 1400) BP: (108-140)/(47-69) 123/54 (05/20 1400) SpO2:  [90 %-98 %] 98 % (05/20 1400) Last BM Date :  (PTA)  Intake/Output from previous day: 05/19 0701 - 05/20 0700 In: 350 [P.O.:150] Out: 238 [Drains:168; Chest Tube:70] Intake/Output this shift: Total I/O In: 778 [P.O.:750; Other:28] Out: 45 [Drains:45]  General:  alert and oriented.    Resp:  breathing comfortably, chest tubes serosang.   CV:sl tachycardic, regular Abd:  soft, non distended, dressings c/d/I.  Drains serosang.   Ext:  warm, well perfused, no edema.    Lab Results:  Recent Labs    02/02/24 0725 02/03/24 0614  WBC 16.6* 21.0*  HGB 10.8* 10.0*  HCT 33.6* 30.6*  PLT 405* 573*   BMET Recent Labs    02/02/24 0725 02/03/24 0614  NA 137 138  K 4.6 4.1  CL 104 103  CO2 23 18*  GLUCOSE 88 78  BUN 6 8  CREATININE 0.54 0.66  CALCIUM  8.9 8.6*   PT/INR No results for input(s): "LABPROT", "INR" in the last 72 hours.  ABG No results for input(s): "PHART", "HCO3" in the last 72 hours.  Invalid input(s): "PCO2", "PO2"   Studies/Results: DG CHEST PORT 1 VIEW Result Date: 02/03/2024 CLINICAL DATA:  Bilateral chest tube EXAM: PORTABLE CHEST 1 VIEW COMPARISON:  Yesterday FINDINGS: Bilateral chest tube. Small right apical pneumothorax, extent similar to prior. Trace left apical pneumothorax which is also stable. Mild cardiac enlargement is stable. Mild interstitial coarsening without Kerley lines, effusion, or air bronchogram. IMPRESSION: Unchanged small right more than left pneumothorax with chest tube in place. Stable aeration. Electronically Signed   By: Ronnette Coke M.D.   On: 02/03/2024 09:29    DG CHEST PORT 1 VIEW Result Date: 02/02/2024 CLINICAL DATA:  Pneumothoraces with chest tubes in-situ EXAM: PORTABLE CHEST 1 VIEW COMPARISON:  Chest radiograph dated 01/31/2024 FINDINGS: Lines/tubes: Similar position of bilateral pleural catheters in-situ. Epidural catheter projects over the right hemithorax. Right upper quadrant surgical clips. Left axillary and lateral chest wall surgical clips. Lungs: Slightly low lung volumes. Increased lung aeration with decreased bibasilar patchy opacities. Pleura: Slightly increased small right pneumothorax and trace left pneumothorax. Trace blunting of bilateral costophrenic angles. Heart/mediastinum: Similar  cardiomediastinal silhouette. Bones: No acute osseous abnormality. IMPRESSION: 1. Slightly increased small right pneumothorax and trace left pneumothorax with bilateral pleural catheters in-situ. 2. Increased lung aeration with decreased bibasilar patchy opacities, likely atelectasis. Electronically Signed   By: Limin  Xu M.D.   On: 02/02/2024 09:39    Anti-infectives: Anti-infectives (From admission, onward)    Start     Dose/Rate Route Frequency Ordered Stop   01/29/24 2200  ceFAZolin  (ANCEF ) IVPB 2g/100 mL premix        2 g 200 mL/hr over 30 Minutes Intravenous Every 8 hours 01/29/24 1840 01/29/24 2153   01/29/24 0700  ceFAZolin  (ANCEF ) IVPB 2g/100 mL premix        2 g 200 mL/hr over 30 Minutes Intravenous On call to O.R. 01/29/24 0653 01/29/24 1331       Assessment/Plan: Postop day 5 s/p Exploratory laparotomy, debulking of metastatic GI stromal tumors with splenectomy, antrectomy with billroth II anastamosis,  partial right hepatectomy, resection of peritoneal metastatic lesions including 3 cm pelvic mass, fulguration of diaphragmatic lesions with argon, placement of bilateral tube thoracostomy tubes, intraoperative ultrasound 01/29/2024 Rainie Crenshaw for recurrent GI stromal tumor  Advance to full liquids. Add carafate  for some bile acid  reflux  Foley-out, no retention  Pain control - epidural out today.   Oral metoprolol  for palpitations  Effexor   Tamoxifen  is on hold for VTE risk and has been off for 2 weeks  Left pneumo sl smaller, pulling this side chest tube given that output was much less.  Keep right chest tube.   D/c left JP.  Keep right.   Ambulate today and incentive spirometry today.  FEN-Na, K, Phos, Mag OK.    VTE prophylaxis - scds, restart lovenox  tomorrow  ABL anemia on anemia of chronic dx.  Better today.  Recheck tomorrow.  Looks like equilibrated.      LOS: 5 days   Anne Fender, MD, FACS, FSSO Surgical Oncology and General Surgery [facility] Surgery, [place] [phone] for weekday/non holidays Check [email or web address] for coverage night/weekend/holidays    Lockie Rima 02/03/2024

## 2024-02-03 NOTE — Anesthesia Post-op Follow-up Note (Signed)
  Anesthesia Pain Follow-up Note  Patient: Anne Horton [facility]  Day #: 5  Date of Follow-up: 02/03/2024 Time: 12:03 PM  Last Vitals:  Vitals:   02/03/24 0900 02/03/24 1100  BP: 132/69   Pulse: (!) 101   Resp: (!) 25   Temp:  36.6 C  SpO2: 94%     Level of Consciousness: alert  Pain: mild, moderate   Side Effects:None  Catheter Site Exam:clean, dry, no drainage  Epidural / Intrathecal (From admission, onward)    Start     Dose/Rate Route Frequency Ordered Stop   01/29/24 1945  ropivacaine  (PF) 2 mg/mL (0.2%) (NAROPIN ) injection        8 mL/hr 8 mL/hr  Epidural Continuous 01/29/24 1907          Plan: Catheter removed/tip intact at surgeon's request, D/C Infusion at surgeon's request, and D/C from anesthesia care at surgeon's request  [name] L Chia Rock

## 2024-02-03 NOTE — Plan of Care (Signed)
  Problem: Clinical Measurements: Goal: Ability to maintain clinical measurements within normal limits will improve Outcome: Progressing Goal: Cardiovascular complication will be avoided Outcome: Progressing   Problem: Activity: Goal: Risk for activity intolerance will decrease Outcome: Progressing   Problem: Nutrition: Goal: Adequate nutrition will be maintained Outcome: Progressing   Problem: Coping: Goal: Level of anxiety will decrease Outcome: Progressing   Problem: Elimination: Goal: Will not experience complications related to urinary retention Outcome: Progressing   Problem: Pain Managment: Goal: General experience of comfort will improve and/or be controlled Outcome: Progressing

## 2024-02-04 ENCOUNTER — Inpatient Hospital Stay (HOSPITAL_COMMUNITY)

## 2024-02-04 ENCOUNTER — Other Ambulatory Visit: Payer: Self-pay

## 2024-02-04 DIAGNOSIS — I4891 Unspecified atrial fibrillation: Secondary | ICD-10-CM | POA: Diagnosis not present

## 2024-02-04 LAB — BPAM FFP
Blood Product Expiration Date: 202505192359
Blood Product Expiration Date: 202505202359
Blood Product Expiration Date: 202505202359
Blood Product Expiration Date: 202505202359
ISSUE DATE / TIME: 202505200847
Unit Type and Rh: 6200
Unit Type and Rh: 6200
Unit Type and Rh: 6200
Unit Type and Rh: 6200

## 2024-02-04 LAB — COMPREHENSIVE METABOLIC PANEL WITH GFR
ALT: 23 U/L (ref 0–44)
AST: 24 U/L (ref 15–41)
Albumin: 3.1 g/dL — ABNORMAL LOW (ref 3.5–5.0)
Alkaline Phosphatase: 61 U/L (ref 38–126)
Anion gap: 12 (ref 5–15)
BUN: 7 mg/dL (ref 6–20)
CO2: 19 mmol/L — ABNORMAL LOW (ref 22–32)
Calcium: 8 mg/dL — ABNORMAL LOW (ref 8.9–10.3)
Chloride: 104 mmol/L (ref 98–111)
Creatinine, Ser: 0.49 mg/dL (ref 0.44–1.00)
GFR, Estimated: >60 mL/min (ref >60)
Glucose, Bld: 106 mg/dL — ABNORMAL HIGH (ref 70–99)
Potassium: 3.6 mmol/L (ref 3.5–5.1)
Sodium: 135 mmol/L (ref 135–145)
Total Bilirubin: 0.8 mg/dL (ref 0.0–1.2)
Total Protein: 5.4 g/dL — ABNORMAL LOW (ref 6.5–8.1)

## 2024-02-04 LAB — PREPARE FRESH FROZEN PLASMA
Unit division: 0
Unit division: 0

## 2024-02-04 LAB — CBC
HCT: 29.6 % — ABNORMAL LOW (ref 36.0–46.0)
Hemoglobin: 9.4 g/dL — ABNORMAL LOW (ref 12.0–15.0)
MCH: 29.6 pg (ref 26.0–34.0)
MCHC: 31.8 g/dL (ref 30.0–36.0)
MCV: 93.1 fL (ref 80.0–100.0)
Platelets: 693 10*3/uL — ABNORMAL HIGH (ref 150–400)
RBC: 3.18 MIL/uL — ABNORMAL LOW (ref 3.87–5.11)
RDW: 15 % (ref 11.5–15.5)
WBC: 23.5 10*3/uL — ABNORMAL HIGH (ref 4.0–10.5)
nRBC: 0.7 % — ABNORMAL HIGH (ref 0.0–0.2)

## 2024-02-04 MED ORDER — METOPROLOL TARTRATE 5 MG/5ML IV SOLN
5.0000 mg | INTRAVENOUS | Status: DC | PRN
  Administered 2024-02-04 (×2): 5 mg via INTRAVENOUS
  Filled 2024-02-04 (×2): qty 5

## 2024-02-04 NOTE — Progress Notes (Signed)
 Occupational Therapy Treatment Patient Details Name: Anne Horton MRN: [id number] DOB: June 20, 1976 Today's Date: 02/04/2024   History of present illness Pt is a 48 y.o. female who presented 01/29/24 for recurrent GI stromal tumor. S/p exploratory laparotomy, debulking of metastatic GI stromal tumors with splenectomy, antrectomy with billroth II anastamosis, partial right hepatectomy, resection of peritoneal metastatic lesions including 3 cm pelvic mass, fulguration of diaphragmatic lesions with argon, placement of bilateral tube thoracostomy tubes, intraoperative ultrasound 5/15. PMH: anemia, anxiety, breast cancer, depression, dysrhythmia, fibromyalgia, mitral regurgitation, neurofibromatosis   OT comments  Pt progressing toward established OT goals. Focus session on standing tasks grooming at sink to optimize standing balance without UE support as well as challenging activity tolerance. Pt able to perform 3 consecutive grooming tasks at sink (oral care, washing hands, donning contacts) with HR elevating to 120-130. Standing rest break and initiating hair care standing. With BUE over head, pt HR elevating to 162 with OT encouraging seated rest bresk. Pt needing prolonged time to recover HR down to upper 120s consisting of 5 min seated rest break. Deferred further mobility as pt HR jumping back to 140s on stand. RN aware and pt resting in chair on departure. Will continue to follow acutely.       If plan is discharge home, recommend the following:  Assistance with cooking/housework;Assist for transportation;Help with stairs or ramp for entrance;A little help with walking and/or transfers;A little help with bathing/dressing/bathroom   Equipment Recommendations  BSC/3in1;Other (comment) (AE kit)    Recommendations for Other Services PT consult    Precautions / Restrictions Precautions Precautions: Fall;Other (comment) Recall of Precautions/Restrictions: Intact Precaution/Restrictions  Comments: abdominal precautions, abdominal binder, R JP drain, R chest tube Restrictions Weight Bearing Restrictions Per Provider Order: No       Mobility Bed Mobility Overal bed mobility: Needs Assistance Bed Mobility: Rolling, Sidelying to Sit Rolling: Contact guard assist, Used rails Sidelying to sit: Contact guard assist            Transfers Overall transfer level: Needs assistance Equipment used: Rolling walker (2 wheels) Transfers: Sit to/from Stand, Bed to chair/wheelchair/BSC Sit to Stand: Supervision     Step pivot transfers: Supervision     General transfer comment: supervision for safety     Balance Overall balance assessment: Needs assistance Sitting-balance support: Single extremity supported, Bilateral upper extremity supported, Feet supported Sitting balance-Leahy Scale: Good Sitting balance - Comments: statically   Standing balance support: Bilateral upper extremity supported, During functional activity Standing balance-Leahy Scale: Fair Standing balance comment: static stands and reaches minimally outside BOS with supervision-CGA                           ADL either performed or assessed with clinical judgement   ADL Overall ADL's : Needs assistance/impaired     Grooming: Oral care;Wash/dry hands;Wash/dry face;Brushing hair;Supervision/safety;Standing Grooming Details (indicate cue type and reason): HR up to 162 with reaching overhead to brush hair.             Lower Body Dressing: Contact guard assist;Sitting/lateral leans   Toilet Transfer: Supervision/safety;Ambulation;Rolling walker (2 wheels);Comfort height toilet   Toileting- Clothing Manipulation and Hygiene: Supervision/safety;Sitting/lateral lean       Functional mobility during ADLs: Supervision/safety;Rolling walker (2 wheels)      Extremity/Trunk Assessment Upper Extremity Assessment Upper Extremity Assessment: Overall WFL for tasks assessed   Lower Extremity  Assessment Lower Extremity Assessment: Defer to PT evaluation  Vision   Vision Assessment?: No apparent visual deficits Additional Comments: wears glasses/contacts   Perception Perception Perception: Not tested   Praxis     Communication Communication Communication: No apparent difficulties   Cognition Arousal: Alert Behavior During Therapy: WFL for tasks assessed/performed Cognition: No apparent impairments             OT - Cognition Comments: pleasant and conversational, improving ability to self-implement rest breaks as needed                 Following commands: Intact        Cueing   Cueing Techniques: Verbal cues  Exercises      Shoulder Instructions       General Comments HR 162 max during standing grooming tasks at sink; particularly rising with reaching up to head with bil hands for brushing hair    Pertinent Vitals/ Pain       Pain Assessment Pain Assessment: Faces Faces Pain Scale: Hurts little more Pain Location: abdomen Pain Descriptors / Indicators: Discomfort, Grimacing, Guarding, Operative site guarding Pain Intervention(s): Limited activity within patient's tolerance  Home Living                                          Prior Functioning/Environment              Frequency  Min 2X/week        Progress Toward Goals  OT Goals(current goals can now be found in the care plan section)  Progress towards OT goals: Progressing toward goals  Acute Rehab OT Goals Patient Stated Goal: get better OT Goal Formulation: With patient Time For Goal Achievement: 02/14/24 Potential to Achieve Goals: Good ADL Goals Pt Will Perform Grooming: with supervision;standing Pt Will Perform Upper Body Bathing: sitting;with adaptive equipment;with supervision Pt Will Perform Lower Body Bathing: with set-up;with adaptive equipment;sitting/lateral leans Pt Will Perform Upper Body Dressing: with modified  independence;sitting Pt Will Perform Lower Body Dressing: with contact guard assist;sit to/from stand;with adaptive equipment Pt Will Transfer to Toilet: with modified independence;ambulating Pt Will Perform Toileting - Clothing Manipulation and hygiene: with set-up;sitting/lateral leans;sit to/from stand  Plan      Co-evaluation                 AM-PAC OT "6 Clicks" Daily Activity     Outcome Measure   Help from another person eating meals?: None Help from another person taking care of personal grooming?: A Little Help from another person toileting, which includes using toliet, bedpan, or urinal?: A Little Help from another person bathing (including washing, rinsing, drying)?: A Little Help from another person to put on and taking off regular upper body clothing?: A Little Help from another person to put on and taking off regular lower body clothing?: A Little 6 Click Score: 19    End of Session Equipment Utilized During Treatment: Gait belt;Rolling walker (2 wheels)  OT Visit Diagnosis: Unsteadiness on feet (R26.81);Other abnormalities of gait and mobility (R26.89);Muscle weakness (generalized) (M62.81);Pain   Activity Tolerance Patient tolerated treatment well   Patient Left in chair;with call bell/phone within reach;with chair alarm set   Nurse Communication Mobility status;Precautions        Time: 1610-9604 OT Time Calculation (min): 22 min  Charges: OT General Charges $OT Visit: 1 Visit OT Treatments $Self Care/Home Management : 8-22 mins  Emery Hans, OTD, OTR/L [facility]  Acute Rehabilitation Office: [phone]   Emery Hans 02/04/2024, 1:21 PM

## 2024-02-04 NOTE — Progress Notes (Signed)
 6 Days Post-Op   Subjective/Chief Complaint: Had episode of tachycardia with HR up to 200s.  Had been ambulating previously but was at rest in the recliner when the tachycardia actually occurred.  It came down on its own to the 150s/160s and was sinus tach.  IV metoprolol  brought it down more, but it was still in the 130s.  Repeat CXR after event shows right PTX unchanged.     Started passing gas but had some more burning type pain.   Less nausea overall.     Objective: Vital signs in last 24 hours: Temp:  [97.7 F (36.5 C)-99.4 F (37.4 C)] 97.7 F (36.5 C) (05/21 0800) Pulse Rate:  [88-113] 103 (05/21 0600) Resp:  [18-30] 18 (05/21 0600) BP: (120-150)/(54-67) 135/65 (05/21 0400) SpO2:  [95 %-98 %] 98 % (05/21 0600) Last BM Date :  (PTA)  Intake/Output from previous day: 05/20 0701 - 05/21 0700 In: 1028 [P.O.:1000] Out: 55 [Drains:55] Intake/Output this shift: No intake/output data recorded.  General:  alert and oriented.    Resp:  breathing comfortably, chest tubes serosang.   CV:sl tachycardic, regular Abd:  soft, non distended, dressings c/d/I.  Drains serosang.   Ext:  warm, well perfused, no edema.    Lab Results:  Recent Labs    02/03/24 0614 02/04/24 0627  WBC 21.0* 23.5*  HGB 10.0* 9.4*  HCT 30.6* 29.6*  PLT 573* 693*   BMET Recent Labs    02/03/24 0614 02/04/24 0627  NA 138 135  K 4.1 3.6  CL 103 104  CO2 18* 19*  GLUCOSE 78 106*  BUN 8 7  CREATININE 0.66 0.49  CALCIUM  8.6* 8.0*   PT/INR No results for input(s): "LABPROT", "INR" in the last 72 hours.  ABG No results for input(s): "PHART", "HCO3" in the last 72 hours.  Invalid input(s): "PCO2", "PO2"   Studies/Results: DG CHEST PORT 1 VIEW Result Date: 02/04/2024 CLINICAL DATA:  Right-sided chest tube. EXAM: PORTABLE CHEST 1 VIEW COMPARISON:  Feb 03, 2024. FINDINGS: Stable cardiomediastinal silhouette. Left-sided chest tube has been removed. Minimal left apical pneumothorax remains.  Right-sided chest tube is again noted and unchanged. Stable small right apical pneumothorax is noted. Bony thorax is unremarkable. IMPRESSION: Left-sided chest tube has been removed. Minimal left apical pneumothorax is noted. Stable right-sided chest tube with stable small right apical pneumothorax. Electronically Signed   By: Rosalene Colon M.D.   On: 02/04/2024 09:42   DG CHEST PORT 1 VIEW Result Date: 02/03/2024 CLINICAL DATA:  Bilateral chest tube EXAM: PORTABLE CHEST 1 VIEW COMPARISON:  Yesterday FINDINGS: Bilateral chest tube. Small right apical pneumothorax, extent similar to prior. Trace left apical pneumothorax which is also stable. Mild cardiac enlargement is stable. Mild interstitial coarsening without Kerley lines, effusion, or air bronchogram. IMPRESSION: Unchanged small right more than left pneumothorax with chest tube in place. Stable aeration. Electronically Signed   By: Ronnette Coke M.D.   On: 02/03/2024 09:29    Anti-infectives: Anti-infectives (From admission, onward)    Start     Dose/Rate Route Frequency Ordered Stop   01/29/24 2200  ceFAZolin  (ANCEF ) IVPB 2g/100 mL premix        2 g 200 mL/hr over 30 Minutes Intravenous Every 8 hours 01/29/24 1840 01/29/24 2153   01/29/24 0700  ceFAZolin  (ANCEF ) IVPB 2g/100 mL premix        2 g 200 mL/hr over 30 Minutes Intravenous On call to O.R. 01/29/24 1610 01/29/24 1331  Assessment/Plan: Postop day 6 s/p Exploratory laparotomy, debulking of metastatic GI stromal tumors with splenectomy, antrectomy with billroth II anastamosis, partial right hepatectomy, resection of peritoneal metastatic lesions including 3 cm pelvic mass, fulguration of diaphragmatic lesions with argon, placement of bilateral tube thoracostomy tubes, intraoperative ultrasound 01/29/2024 Teran Knittle for recurrent GI stromal tumor  Calling cards regarding tachycardia.  Patient has been on beta blockade since post op.    Advance to soft diet   Foley-out, no  retention  Pain control - epidural out.     Effexor    Tamoxifen  is on hold for VTE risk and has been off for 2 weeks  Left chest tube out.  No change in tr left PTX.  Right PTX unchanged.  D/c right chest tube today.      Ambulate today and incentive spirometry today.  FEN-Na, K, Phos, Mag OK.    VTE prophylaxis - scds, restart lovenox  today  ABL anemia on anemia of chronic dx.  Better today.  Recheck tomorrow.  Looks like equilibrated.   Dispo:  doing better.  Low grade temps, leukocytosis.  Get CT given rising WBCs.  Will wait on this today given episode of tachycardia. Hold on transfer to stepdown with tachycardia episode.    LOS: 6 days   Deliliah Fender, MD, FACS, FSSO Surgical Oncology and General [facility], [place] [phone] for weekday/non holidays Check [email or web address] for coverage night/weekend/holidays    Lockie Rima 02/04/2024

## 2024-02-04 NOTE — Consult Note (Addendum)
 Cardiology Consultation   Patient ID: [name] MRN: [id number]; DOB: 1976-08-29  Admit date: 01/29/2024 Date of Consult: 02/04/2024  PCP:  [name], OT   [facility] Providers Cardiologist:  Ahmad Alert, MD   {   Patient Profile:   Anne Horton is a 48 y.o. female with a hx of hypertension, neurofibromatosis, sinus tachycardia, hyperlipidemia, mitral regurgitation, migraines, anemia, history of breast cancer, history of gastrointestinal stromal tumor who presented for surgical treatment on 01/29/2024 who is being seen 02/04/2024 for the evaluation of atrial fibrillation with RVR at the request of Dr. Cherlynn Cornfield.  History of Present Illness:   Ms. Borchardt has past medical history as stated above. She presented to [facility] on 01/29/2024 to undergo an exploratory laparotomy, debulking of metastatic GI stromal tumors with splenectomy, antrectomy, partial right hepatectomy, resection of peritoneal metastatic lesions, fulguration of diaphragmatic lesions with argon, placement of bilateral tube thoracostomy tubes for recurrent GI stromal tumor.   She was post-op day 6 when it was reported she had an episode of tachycardia with HR up to 200s. The episode was reported to have taken place while resting in recliner after recently ambulating. It was reported to be sinus tachycardia and he was given IV Lopressor  which brought his rates down to 130s. Patient had been taking Toprol  50 mg since 5/18.   She is a patient of Dr. Alroy Aspen and was last seen in the office in 01/13/2024. He discussed her history of sinus tachycardia where he knew of her upcoming surgery and determined she was low risk and good to proceed. At this time her HR was well controlled on her Toprol  50 mg daily, it as noted to be 82 bpm at this visit.   After speaking with the patient and her family present in the room, she agrees with the history as stated above. She reports that she typically has  resting HR of 80-100 with her Toprol  at home. She has had many surgeries in the past, due to her neurofibromatosis, and has never experienced post-op complications with rapid rates. She says that she was walking to the bathroom, noticed her HR was getting a bit high, then sat back down and when she was in the recliner is when it got up to the 200s. She has no know history of A. Fib that she knows of. She reports possible sleep apnea but has never been formally diagnosed. She denied any chest pain, palpitations, dizziness, syncope or pre-syncope during the time of her rapid HR but did state that it "felt weird" when it was happening.   Of note, her CBC this morning showed her WBC trending up, at 23.5 today and hemoglobin trending back down, at 9.4 today. She was at 6.7 three days ago requiring transfusion. She was reported to have a temperature of 100.7 on 5/17.  Past Medical History:  Diagnosis Date   Allergy    Anemia    Iron  Deficiency   Anxiety    Breast cancer (HCC) 05/07/2018   left invasive ductal    Chest pain    Depression    Dysrhythmia    Palpitations   Family history of breast cancer    Fibromyalgia    neurofibromatosis   gist 2022   recurrence 2024   H/O: depression    Hx of migraines    Mitral regurgitation    mild per echo EF 55-60%   Neurofibromatosis (HCC)    Palpitations    Personal history  of radiation therapy 08/10/2018   left breast 06/24/2018-08/10/2018  Dr [name]   Tachycardia    Hx. of   Wears contact lenses    Past Surgical History:  Procedure Laterality Date   ANTRECTOMY N/A 01/29/2024   Procedure: ANTRECTOMY;  Surgeon: Lockie Rima, MD;  Location: [facility];  Service: General;  Laterality: N/A;   BOWEL RESECTION N/A 04/03/2021   Procedure: SMALL BOWEL RESECTION;  Surgeon: Lockie Rima, MD;  Location: [facility];  Service: General;  Laterality: N/A;   BREAST BIOPSY     BREAST LUMPECTOMY WITH RADIOACTIVE SEED AND SENTINEL LYMPH NODE BIOPSY Left 05/07/2018    Procedure: BREAST LUMPECTOMY WITH RADIOACTIVE SEED AND SENTINEL LYMPH NODE BIOPSY;  Surgeon: [name], MD;  Location: [facility];  Service: General;  Laterality: Left;   CESAREAN SECTION     CHEST TUBE INSERTION Bilateral 01/29/2024   Procedure: CHEST TUBE INSERTION;  Surgeon: Lockie Rima, MD;  Location: [facility];  Service: General;  Laterality: Bilateral;   CHOLECYSTECTOMY N/A 01/04/2015   Procedure: LAPAROSCOPIC CHOLECYSTECTOMY WITH INTRAOPERATIVE CHOLANGIOGRAM;  Surgeon: Sim Dryer, MD;  Location: [facility];  Service: General;  Laterality: N/A;   COLONOSCOPY     ESOPHAGOGASTRODUODENOSCOPY     LAPAROSCOPY N/A 04/09/2023   Procedure: XI ROBOT ASSISTED DIAGNOSTIC LAPAROSCOPY, ROBOTIC assisted OPEN MASS EXCISION, TOTAL ROBOTIC HYSTERECTOMY WITH BILATERAL SALPINGO OOPHORECTOMY, CYSTOSCOPY;  Surgeon: Suzi Essex, MD;  Location: [facility];  Service: Gynecology;  Laterality: N/A;   LAPAROTOMY N/A 04/03/2021   Procedure: EXPLORATORY LAPAROTOMY WITH RESECTION OF ABDOMINAL MASS;  Surgeon: Lockie Rima, MD;  Location: [facility];  Service: General;  Laterality: N/A;   MASTOPEXY Bilateral 09/05/2020   Procedure: BILATERAL BREAST MASTOPEXY;  Surgeon: Alger Infield, MD;  Location: [facility];  Service: Plastics;  Laterality: Bilateral;   OMENTECTOMY N/A 04/03/2021   Procedure: OMENTECTOMY;  Surgeon: Lockie Rima, MD;  [facility];  Service: General;  Laterality: N/A;   RESECTION OF RETROPERITONEAL MASS N/A 01/29/2024   Procedure: REMOVAL OF MALIGNANT RETROPERITONEAL MASS;  Surgeon: Lockie Rima, MD;  Location: [facility];  Service: General;  Laterality: N/A;  REMOVAL OF MALIGNANT RETROPERITONEAL MASS, 2ND RETOPERITONEAL MASS SPLENECTOMY,  ANTRECTOMY, PELVIC MASS EXCISION DEBULKING OF STROMAL TUMOR   ROBOTIC ASSISTED LAPAROSCOPIC LYSIS OF ADHESION N/A 04/09/2023   Procedure: XI ROBOTIC ASSISTED REMOVAL OF PELVIC MASS; INCIDENTAL LAPAROSCOPIC  APPENDECTOMY;  Surgeon: Lockie Rima, MD;  Location: [facility];  Service: General;  Laterality: N/A;   SPLENECTOMY, TOTAL N/A 01/29/2024   Procedure: SPLENECTOMY;  Surgeon: Lockie Rima, MD;  Location[facility];  Service: General;  Laterality: N/A;   WISDOM TOOTH EXTRACTION      Home Medications:  Prior to Admission medications   Medication Sig Start Date End Date Taking? Authorizing Provider  acetaminophen  (TYLENOL ) 500 MG tablet Take 1,000 mg by mouth every 6 (six) hours as needed for moderate pain (pain score 4-6).   Yes [provider]  ALPRAZolam  (XANAX ) 0.5 MG tablet Take 1/2 to 1 tablet by mouth 2 times a day as needed 01/22/22  Yes   CALCIUM  PO Take 2 tablets by mouth daily at 6 (six) AM. GUMMY   Yes [provider]  cetirizine (ZYRTEC) 10 MG tablet Take 10 mg by mouth daily.   Yes [provider]  ibuprofen  (ADVIL ) 200 MG tablet Take 800 mg by mouth every 6 (six) hours as needed for moderate pain (pain score 4-6).   Yes [provider]  imatinib  (GLEEVEC )  400 MG tablet Take 1 tablet (400 mg total) by mouth 2 (two) times a day. Take with a large glass of water . You may dissolve in water  or apple juice to make swallowing easier.  Should be taken with a meal. Do not take on an empty stomach. 11/18/23  Yes   metoprolol  succinate (TOPROL -XL) 50 MG 24 hr tablet Take 1 tablet (50 mg total) by mouth daily. 10/28/23  Yes [name], [name], MD  Multiple Vitamin (MULTIVITAMIN) tablet Take 2 tablets by mouth daily.   Yes [provider]  Multiple Vitamins-Minerals (HAIR SKIN & NAILS) TABS Take 2 tablets by mouth daily.   Yes [provider]  ondansetron  (ZOFRAN ) 8 MG tablet Take 1 tablet (8 mg total) by mouth every 8 (eight) hours as needed for nausea or vomiting. 11/21/23  Yes   propranolol  (INDERAL ) 10 MG tablet TAKE 1 TABLET BY MOUTH FOUR TIMES DAILY AS NEEDED FOR PALPITATIONS 09/28/21 01/29/24 Yes [name], [name], MD  sennosides-docusate sodium  (SENOKOT-S)  8.6-50 MG tablet Take 1 tablet by mouth 2 (two) times daily.   Yes [provider]  tamoxifen  (NOLVADEX ) 20 MG tablet TAKE 1 TABLET BY MOUTH ONCE A DAY 11/19/23 11/18/24 Yes Sonja [place], MD  venlafaxine  XR (EFFEXOR -XR) 75 MG 24 hr capsule Take 1 capsule (75 mg total) by mouth daily. 01/01/24  Yes   ALPRAZolam  (XANAX ) 0.5 MG tablet Take 1/2 to 1 tablet by mouth 2 times a day as needed Patient not taking: Reported on 01/16/2024 01/01/24     ondansetron  (ZOFRAN ) 8 MG tablet Take 1 tablet (8 mg) by mouth every 8 hours as needed for nausea or vomiting. Patient not taking: Reported on 01/16/2024 03/13/23   Burton, Lacie K, NP  venlafaxine  XR (EFFEXOR -XR) 75 MG 24 hr capsule Take 1 capsule (75 mg total) by mouth daily. Patient not taking: Reported on 01/16/2024 07/04/23      Inpatient Medications: Scheduled Meds:  sodium chloride    Intravenous Once   sodium chloride    Intravenous Once   acetaminophen   1,000 mg Oral Q6H   Chlorhexidine  Gluconate Cloth  6 each Topical Daily   enoxaparin  (LOVENOX ) injection  40 mg Subcutaneous Q24H   feeding supplement  1 Container Oral BID BM   methocarbamol   500 mg Oral TID   metoprolol  succinate  50 mg Oral Daily   sucralfate   1 g Oral TID WC & HS   venlafaxine  XR  75 mg Oral Daily   Continuous Infusions:  PRN Meds: ALPRAZolam , HYDROmorphone  (DILAUDID ) injection, metoprolol  tartrate, oxyCODONE , prochlorperazine  **OR** prochlorperazine   Allergies:    Allergies  Allergen Reactions   Iron  Sucrose Hives, Rash and Other (See Comments)    Respiratory Distress (ALLERGY/intolerance) seizure like activity   Other     Dermabond - hives and blisters    Latex Rash   Social History:   Social History   Socioeconomic History   Marital status: Married    Spouse name: Not on file   Number of children: 1   Years of education: Not on file   Highest education level: Not on file  Occupational History   Occupation: [employer]  Tobacco Use   Smoking status: Never    Smokeless tobacco: Never  Vaping Use   Vaping status: Never Used  Substance and Sexual Activity   Alcohol use: Not Currently    Alcohol/week: 0.0 - 1.0 standard drinks of alcohol    Comment: occasional but rarely   Drug use: No   Sexual activity: Yes  Birth control/protection: None  Other Topics Concern   Not on file  Social History Narrative   Pt lives with husband and daughter   Social Drivers of Health   Financial Resource Strain: Low Risk  (11/17/2020)   Received from [facility] visits prior to 11/16/2022., [facility] visits prior to 11/16/2022.   Overall Financial Resource Strain (CARDIA)    Difficulty of Paying Living Expenses: Not hard at all  Food Insecurity: No Food Insecurity (02/03/2024)   Hunger Vital Sign    Worried About Running Out of Food in the Last Year: Never true    Ran Out of Food in the Last Year: Never true  Transportation Needs: No Transportation Needs (02/03/2024)   PRAPARE - Administrator, Civil Service (Medical): No    Lack of Transportation (Non-Medical): No  Physical Activity: Inactive (11/17/2020)   Received from [facility] visits prior to 11/16/2022., [facility] visits prior to 11/16/2022.   Exercise Vital Sign    Days of Exercise per Week: 0 days    Minutes of Exercise per Session: 0 min  Stress: Stress Concern Present (11/17/2020)   Received from [facility] visits prior to 11/16/2022., [facility] visits prior to 11/16/2022.   [employer] of Occupational Health - Occupational Stress Questionnaire    Feeling of Stress : To some extent  Social Connections: Moderately Isolated (11/17/2020)   Received from [facility] visits prior to 11/16/2022., [facility] visits prior to 11/16/2022.   Social Advertising account executive [NHANES]    Frequency of Communication with Friends  and Family: More than three times a week    Frequency of Social Gatherings with Friends and Family: Patient refused    Attends Religious Services: Never    Database administrator or Organizations: No    Attends Engineer, structural: Patient refused    Marital Status: Married  Catering manager Violence: Not At Risk (02/03/2024)   Humiliation, Afraid, Rape, and Kick questionnaire    Fear of Current or Ex-Partner: No    Emotionally Abused: No    Physically Abused: No    Sexually Abused: No    Family History:   Family History  Problem Relation Age of Onset   Breast cancer Mother 58       estrogen negative, recurred in 2012   Diabetes Mother    Neurofibromatosis Father    Atrial fibrillation Father    Heart disease Paternal Uncle    Diabetes Paternal Uncle    Atrial fibrillation Maternal Grandfather    Cancer Paternal Grandmother        type unk, died at 65   Neurofibromatosis Paternal Grandmother    Stroke Paternal Grandfather    Heart attack Neg Hx    Hypertension Neg Hx    Colon cancer Neg Hx    Colon polyps Neg Hx    Esophageal cancer Neg Hx    Rectal cancer Neg Hx    Stomach cancer Neg Hx     ROS:  Please see the history of present illness.  All other ROS reviewed and negative.     Physical Exam/Data:   Vitals:   02/04/24 0500 02/04/24 0600 02/04/24 0800 02/04/24 1200  BP:      Pulse: (!) 110 (!) 103    Resp: 18 18    Temp:   97.7 F (  36.5 C) 97.7 F (36.5 C)  TempSrc:   Oral Oral  SpO2: 96% 98%    Weight:      Height:       Intake/Output Summary (Last 24 hours) at 02/04/2024 1502 Last data filed at 02/04/2024 1200 Gross per 24 hour  Intake 250 ml  Output 45 ml  Net 205 ml      01/29/2024    6:55 AM 01/20/2024    7:57 AM 01/19/2024    8:11 AM  Last 3 Weights  Weight (lbs) 132 lb 136 lb 11.2 oz 139 lb 12.8 oz  Weight (kg) 59.875 kg 62.007 kg 63.413 kg     Body mass index is 24.14 kg/m.   General:  Well nourished, well developed, in no acute  distress HEENT: normal Neck: no JVD Vascular: Distal pulses 2+ bilaterally, No carotid bruits  Cardiac:  normal S1, S2; tachycardic, regular rhythm; no murmur  Lungs:  normal breathing effort Abd: soft, dressings clean and dry  Ext: no edema Musculoskeletal:  No deformities Skin: warm and dry  Neuro:  no focal abnormalities noted Psych:  Normal affect   EKG:  The EKG was personally reviewed and demonstrates:  atrial fibrillation with RVR, HR 130 bpm  Telemetry:  Telemetry was personally reviewed and demonstrates:  currently in sinus tachycardia, HR 100-110s  Relevant CV Studies: Echocardiogram, ordered pending results   Laboratory Data:  High Sensitivity Troponin:  No results for input(s): "TROPONINIHS" in the last 720 hours.   Chemistry Recent Labs  Lab 02/01/24 0618 02/02/24 0725 02/03/24 0614 02/04/24 0627  NA 143 137 138 135  K 4.0 4.6 4.1 3.6  CL 110 104 103 104  CO2 24 23 18* 19*  GLUCOSE 134* 88 78 106*  BUN <5* 6 8 7   CREATININE 0.52 0.54 0.66 0.49  CALCIUM  8.1* 8.9 8.6* 8.0*  MG 2.1 2.0 2.1  --   GFRNONAA >60 >60 >60 >60  ANIONGAP 9 10 17* 12    Recent Labs  Lab 02/02/24 0725 02/03/24 0614 02/04/24 0627  PROT 6.1* 5.6* 5.4*  ALBUMIN  3.8 3.4* 3.1*  AST 38 31 24  ALT 36 30 23  ALKPHOS 55 63 61  BILITOT 1.4* 1.7* 0.8   Lipids No results for input(s): "CHOL", "TRIG", "HDL", "LABVLDL", "LDLCALC", "CHOLHDL" in the last 168 hours.  Hematology Recent Labs  Lab 02/02/24 0725 02/03/24 0614 02/04/24 0627  WBC 16.6* 21.0* 23.5*  RBC 3.66* 3.38* 3.18*  HGB 10.8* 10.0* 9.4*  HCT 33.6* 30.6* 29.6*  MCV 91.8 90.5 93.1  MCH 29.5 29.6 29.6  MCHC 32.1 32.7 31.8  RDW 15.4 15.3 15.0  PLT 405* 573* 693*   Thyroid  No results for input(s): "TSH", "FREET4" in the last 168 hours.  BNPNo results for input(s): "BNP", "PROBNP" in the last 168 hours.  DDimer No results for input(s): "DDIMER" in the last 168 hours.  Radiology/Studies:  DG CHEST PORT 1  VIEW Result Date: 02/04/2024 CLINICAL DATA:  Chest tube in place. EXAM: PORTABLE CHEST 1 VIEW COMPARISON:  Feb 04, 2024. FINDINGS: Stable right-sided chest tube with stable small right apical pneumothorax. Stable minimal left apical pneumothorax. IMPRESSION: Stable bilateral pneumothoraces as noted above. Electronically Signed   By: Rosalene Colon M.D.   On: 02/04/2024 13:59   DG CHEST PORT 1 VIEW Result Date: 02/04/2024 CLINICAL DATA:  Right-sided chest tube. EXAM: PORTABLE CHEST 1 VIEW COMPARISON:  Feb 03, 2024. FINDINGS: Stable cardiomediastinal silhouette. Left-sided chest tube has been removed. Minimal left apical  pneumothorax remains. Right-sided chest tube is again noted and unchanged. Stable small right apical pneumothorax is noted. Bony thorax is unremarkable. IMPRESSION: Left-sided chest tube has been removed. Minimal left apical pneumothorax is noted. Stable right-sided chest tube with stable small right apical pneumothorax. Electronically Signed   By: Rosalene Colon M.D.   On: 02/04/2024 09:42   DG CHEST PORT 1 VIEW Result Date: 02/03/2024 CLINICAL DATA:  Bilateral chest tube EXAM: PORTABLE CHEST 1 VIEW COMPARISON:  Yesterday FINDINGS: Bilateral chest tube. Small right apical pneumothorax, extent similar to prior. Trace left apical pneumothorax which is also stable. Mild cardiac enlargement is stable. Mild interstitial coarsening without Kerley lines, effusion, or air bronchogram. IMPRESSION: Unchanged small right more than left pneumothorax with chest tube in place. Stable aeration. Electronically Signed   By: Ronnette Coke M.D.   On: 02/03/2024 09:29   DG CHEST PORT 1 VIEW Result Date: 02/02/2024 CLINICAL DATA:  Pneumothoraces with chest tubes in-situ EXAM: PORTABLE CHEST 1 VIEW COMPARISON:  Chest radiograph dated 01/31/2024 FINDINGS: Lines/tubes: Similar position of bilateral pleural catheters in-situ. Epidural catheter projects over the right hemithorax. Right upper quadrant surgical  clips. Left axillary and lateral chest wall surgical clips. Lungs: Slightly low lung volumes. Increased lung aeration with decreased bibasilar patchy opacities. Pleura: Slightly increased small right pneumothorax and trace left pneumothorax. Trace blunting of bilateral costophrenic angles. Heart/mediastinum: Similar  cardiomediastinal silhouette. Bones: No acute osseous abnormality. IMPRESSION: 1. Slightly increased small right pneumothorax and trace left pneumothorax with bilateral pleural catheters in-situ. 2. Increased lung aeration with decreased bibasilar patchy opacities, likely atelectasis. Electronically Signed   By: Limin  Xu M.D.   On: 02/02/2024 09:39     Assessment and Plan:   Atrial fibrillation with RVR, newly diagnosed  Sinus tachycardia  Patient has known history of sinus tachycardia, no prior history of A. Fib  Home meds: Toprol  50 mg daily, PRN propranolol  10 mg  Baseline HR 80-100s Reported rapid HR of up to 200s after ambulating to bathroom this afternoon EKG showed A. Fib with RVR with HR 130  Noted to have temperature of 100.7 on 5/17 CBC showed WBC 23.5, hemoglobin 9.5, platelets 693k today Patient denies any symptoms  Given IV Lopressor  5 mg x 2 doses which aided in lowering HR  Currently in sinus tachycardia with HR 100-110s Electrolytes normal  TSH normal  BP low-normal, hold off on increasing BB at the moment  Ordered echocardiogram, pending results Patient would likely need cardiac monitor at discharge  Continue Toprol  50 mg daily  Continue with PRN IV Lopressor    Per primary Post-op, recurrent GIST  Acute blood loss anemia on chronic anemia  Neurofibromatosis  Migraines    Risk Assessment/Risk Scores:       CHA2DS2-VASc Score = 2   This indicates a 2.2% annual risk of stroke. The patient's score is based upon: CHF History: 0 HTN History: 1 Diabetes History: 0 Stroke History: 0 Vascular Disease History: 0 Age Score: 0 Gender Score: 1       For questions or updates, please contact  [facility] Please consult [email or web address] for contact info under    Signed, Jiles Mote, PA-C  02/04/2024 3:02 PM  Patient seen, examined. Available data reviewed. Agree with findings, assessment, and plan as outlined by Laquita Plant, PA-C.  The patient is independently interviewed and examined.  She is alert, oriented, in no distress.  HEENT is normal, JVP is normal, carotid upstrokes are normal without bruits, lungs are clear bilaterally, heart  is tachycardic and regular without murmur or gallop.  Abdomen shows well-healing surgical incision sites.  Lower extremities have no edema, skin is warm and dry with no rash.  Telemetry is reviewed and shows sinus tachycardia alternating with atrial fibrillation with RVR.  2D echocardiogram is currently pending.  The patient currently has sinus tachycardia which is appropriate considering her large intra-abdominal surgery last week and current physiologic state.  She is having episodes of postoperative, paroxysmal atrial fibrillation with RVR.  Her CHA2DS2-VASc score equals 2 for hypertension and female gender.  Considering the fact that she is just recovering from surgery, she is not a candidate for oral anticoagulation at this point.  I would continue her on metoprolol  succinate 50 mg daily.  As her blood pressure allows, we may try to increase her beta-blocker to twice daily dosing.  Will check a 2D echocardiogram as above.  Hopefully, her atrial fibrillation will be limited to the postoperative period.  Our team will follow along.  She should be kept on telemetry even as she transitions out of the ICU.  Arnoldo Lapping, M.D. 02/04/2024 3:34 PM

## 2024-02-05 ENCOUNTER — Inpatient Hospital Stay (HOSPITAL_COMMUNITY)

## 2024-02-05 DIAGNOSIS — I4891 Unspecified atrial fibrillation: Secondary | ICD-10-CM

## 2024-02-05 LAB — COMPREHENSIVE METABOLIC PANEL WITH GFR
ALT: 22 U/L (ref 0–44)
AST: 23 U/L (ref 15–41)
Albumin: 3.2 g/dL — ABNORMAL LOW (ref 3.5–5.0)
Alkaline Phosphatase: 76 U/L (ref 38–126)
Anion gap: 9 (ref 5–15)
BUN: 7 mg/dL (ref 6–20)
CO2: 25 mmol/L (ref 22–32)
Calcium: 8.2 mg/dL — ABNORMAL LOW (ref 8.9–10.3)
Chloride: 102 mmol/L (ref 98–111)
Creatinine, Ser: 0.63 mg/dL (ref 0.44–1.00)
GFR, Estimated: >60 mL/min (ref >60)
Glucose, Bld: 118 mg/dL — ABNORMAL HIGH (ref 70–99)
Potassium: 3.4 mmol/L — ABNORMAL LOW (ref 3.5–5.1)
Sodium: 136 mmol/L (ref 135–145)
Total Bilirubin: 0.8 mg/dL (ref 0.0–1.2)
Total Protein: 5.6 g/dL — ABNORMAL LOW (ref 6.5–8.1)

## 2024-02-05 LAB — CBC
HCT: 30.8 % — ABNORMAL LOW (ref 36.0–46.0)
Hemoglobin: 10 g/dL — ABNORMAL LOW (ref 12.0–15.0)
MCH: 29.2 pg (ref 26.0–34.0)
MCHC: 32.5 g/dL (ref 30.0–36.0)
MCV: 90.1 fL (ref 80.0–100.0)
Platelets: 948 10*3/uL (ref 150–400)
RBC: 3.42 MIL/uL — ABNORMAL LOW (ref 3.87–5.11)
RDW: 15 % (ref 11.5–15.5)
WBC: 23.1 10*3/uL — ABNORMAL HIGH (ref 4.0–10.5)
nRBC: 0.5 % — ABNORMAL HIGH (ref 0.0–0.2)

## 2024-02-05 LAB — ECHOCARDIOGRAM COMPLETE
AR max vel: 2.76 cm2
AV Area VTI: 2.8 cm2
AV Area mean vel: 2.84 cm2
AV Mean grad: 6 mmHg
AV Peak grad: 12.3 mmHg
Ao pk vel: 1.75 m/s
Area-P 1/2: 5.13 cm2
Height: 62 in
S' Lateral: 1.9 cm
Weight: 2112 [oz_av]

## 2024-02-05 MED ORDER — METOPROLOL SUCCINATE ER 50 MG PO TB24
50.0000 mg | ORAL_TABLET | Freq: Two times a day (BID) | ORAL | Status: DC
  Administered 2024-02-05 – 2024-02-19 (×27): 50 mg via ORAL
  Filled 2024-02-05 (×27): qty 1

## 2024-02-05 MED ORDER — POTASSIUM CHLORIDE CRYS ER 20 MEQ PO TBCR
40.0000 meq | EXTENDED_RELEASE_TABLET | Freq: Once | ORAL | Status: AC
  Administered 2024-02-05: 40 meq via ORAL
  Filled 2024-02-05: qty 2

## 2024-02-05 MED ORDER — ASPIRIN 81 MG PO TBEC
81.0000 mg | DELAYED_RELEASE_TABLET | Freq: Every day | ORAL | Status: DC
  Administered 2024-02-05 – 2024-02-19 (×14): 81 mg via ORAL
  Filled 2024-02-05 (×14): qty 1

## 2024-02-05 MED ORDER — METOPROLOL SUCCINATE ER 50 MG PO TB24
75.0000 mg | ORAL_TABLET | Freq: Every day | ORAL | Status: DC
  Administered 2024-02-05: 75 mg via ORAL
  Filled 2024-02-05: qty 1

## 2024-02-05 MED ORDER — SUCRALFATE 1 GM/10ML PO SUSP
1.0000 g | ORAL | Status: DC
  Administered 2024-02-05 – 2024-02-12 (×18): 1 g via ORAL
  Filled 2024-02-05 (×30): qty 10

## 2024-02-05 MED ORDER — IOHEXOL 350 MG/ML SOLN
75.0000 mL | Freq: Once | INTRAVENOUS | Status: AC | PRN
  Administered 2024-02-05: 75 mL via INTRAVENOUS

## 2024-02-05 NOTE — Progress Notes (Addendum)
 Rounding Note    Patient Name: Anne Horton [facility] Date of Encounter: 02/05/2024  [facility] Cardiologist: Ahmad Alert, MD   Subjective   Palpitations, but no significant SOB, no chest pain  Inpatient Medications    Scheduled Meds:  sodium chloride    Intravenous Once   sodium chloride    Intravenous Once   acetaminophen   1,000 mg Oral Q6H   aspirin EC  81 mg Oral Daily   Chlorhexidine  Gluconate Cloth  6 each Topical Daily   enoxaparin  (LOVENOX ) injection  40 mg Subcutaneous Q24H   feeding supplement  1 Container Oral BID BM   methocarbamol   500 mg Oral TID   metoprolol  succinate  75 mg Oral Daily   sucralfate   1 g Oral TID WC & HS   venlafaxine  XR  75 mg Oral Daily   Continuous Infusions:  PRN Meds: ALPRAZolam , HYDROmorphone  (DILAUDID ) injection, metoprolol  tartrate, oxyCODONE , prochlorperazine  **OR** prochlorperazine    Vital Signs    Vitals:   02/05/24 0600 02/05/24 0700 02/05/24 0802 02/05/24 0918  BP:    (!) 142/63  Pulse: (!) 120 (!) 120  (!) 128  Resp: (!) 25 19    Temp:   97.9 F (36.6 C)   TempSrc:   Oral   SpO2: 94% 93%    Weight:      Height:        Intake/Output Summary (Last 24 hours) at 02/05/2024 1005 Last data filed at 02/04/2024 1900 Gross per 24 hour  Intake --  Output 30 ml  Net -30 ml      01/29/2024    6:55 AM 01/20/2024    7:57 AM 01/19/2024    8:11 AM  Last 3 Weights  Weight (lbs) 132 lb 136 lb 11.2 oz 139 lb 12.8 oz  Weight (kg) 59.875 kg 62.007 kg 63.413 kg      Telemetry    Sinus tachycardia, last episode of afib was yesterday after 1PM, has been holding sinus rhythm since. - Personally Reviewed  ECG    5/21   2:1 atrial flutter - Personally Reviewed  Physical Exam   GEN: No acute distress.   Neck: No JVD Cardiac: tachycardic, no murmurs, rubs, or gallops.  Respiratory: Clear to auscultation bilaterally. GI: Soft, nontender, non-distended  MS: No edema; No deformity. Neuro:  Nonfocal  Psych: Normal affect    Labs    High Sensitivity Troponin:  No results for input(s): "TROPONINIHS" in the last 720 hours.   Chemistry Recent Labs  Lab 02/01/24 0618 02/02/24 0725 02/03/24 0614 02/04/24 0627 02/05/24 0533  NA 143 137 138 135 136  K 4.0 4.6 4.1 3.6 3.4*  CL 110 104 103 104 102  CO2 24 23 18* 19* 25  GLUCOSE 134* 88 78 106* 118*  BUN <5* 6 8 7 7   CREATININE 0.52 0.54 0.66 0.49 0.63  CALCIUM  8.1* 8.9 8.6* 8.0* 8.2*  MG 2.1 2.0 2.1  --   --   PROT 4.9* 6.1* 5.6* 5.4* 5.6*  ALBUMIN  3.5 3.8 3.4* 3.1* 3.2*  AST 51* 38 31 24 23   ALT 34 36 30 23 22   ALKPHOS 30* 55 63 61 76  BILITOT 1.0 1.4* 1.7* 0.8 0.8  GFRNONAA >60 >60 >60 >60 >60  ANIONGAP 9 10 17* 12 9    Lipids No results for input(s): "CHOL", "TRIG", "HDL", "LABVLDL", "LDLCALC", "CHOLHDL" in the last 168 hours.  Hematology Recent Labs  Lab 02/03/24 0614 02/04/24 0627 02/05/24 0533  WBC 21.0* 23.5* 23.1*  RBC  3.38* 3.18* 3.42*  HGB 10.0* 9.4* 10.0*  HCT 30.6* 29.6* 30.8*  MCV 90.5 93.1 90.1  MCH 29.6 29.6 29.2  MCHC 32.7 31.8 32.5  RDW 15.3 15.0 15.0  PLT 573* 693* 948*   Thyroid  No results for input(s): "TSH", "FREET4" in the last 168 hours.  BNPNo results for input(s): "BNP", "PROBNP" in the last 168 hours.  DDimer No results for input(s): "DDIMER" in the last 168 hours.   Radiology    DG CHEST PORT 1 VIEW Result Date: 02/05/2024 CLINICAL DATA:  Encounter for chest tube removal EXAM: PORTABLE CHEST 1 VIEW COMPARISON:  Yesterday FINDINGS: Increased right pneumothorax after chest tube removal, 3.5 cm in thickness compared to 1.2 cm at the apex. Allowing for slight rotation, no convincing mediastinal shift. No diaphragm depression. The lungs appear clear. Normal heart size. Drain over the right upper quadrant. ASAP, these results will be called to the ordering clinician or representative by the Radiologist Assistant, and communication documented in the PACS or [employer]. IMPRESSION: Right chest tube removal with  moderate pneumothorax that is increased from yesterday. Suggest continued radiographic follow-up to differentiate air leak from introduced air during chest tube removal. Electronically Signed   By: Ronnette Coke M.D.   On: 02/05/2024 09:32   DG CHEST PORT 1 VIEW Result Date: 02/04/2024 CLINICAL DATA:  Chest tube in place. EXAM: PORTABLE CHEST 1 VIEW COMPARISON:  Feb 04, 2024. FINDINGS: Stable right-sided chest tube with stable small right apical pneumothorax. Stable minimal left apical pneumothorax. IMPRESSION: Stable bilateral pneumothoraces as noted above. Electronically Signed   By: Rosalene Colon M.D.   On: 02/04/2024 13:59   DG CHEST PORT 1 VIEW Result Date: 02/04/2024 CLINICAL DATA:  Right-sided chest tube. EXAM: PORTABLE CHEST 1 VIEW COMPARISON:  Feb 03, 2024. FINDINGS: Stable cardiomediastinal silhouette. Left-sided chest tube has been removed. Minimal left apical pneumothorax remains. Right-sided chest tube is again noted and unchanged. Stable small right apical pneumothorax is noted. Bony thorax is unremarkable. IMPRESSION: Left-sided chest tube has been removed. Minimal left apical pneumothorax is noted. Stable right-sided chest tube with stable small right apical pneumothorax. Electronically Signed   By: Rosalene Colon M.D.   On: 02/04/2024 09:42    Cardiac Studies   Pending echo  Patient Profile     48 y.o. female with PMH of HTN, HLD, neurofibromatosis, sinus tach, mitral regurgitation, migraines, anemia, h/o breast CA and h/o GI stromal tumor who presented on 01/29/2024 to undergo an exploratory laparotomy, debulking of metastatic GI stromal tumors with splenectomy, antrectomy, partial right hepatectomy, resection of peritoneal metastatic lesions, fulguration of diaphragmatic lesions with argon, placement of bilateral tube thoracostomy tubes for recurrent GI stromal tumor. Post op, patient had episodes of new afib.   Assessment & Plan    Post atrial fibrillations  - not a  candidate for anticoagulation given recent surgery.  - converted back to sinus rhythm and has been maintaining sinus tach with HR 110-120s since 2PM on 5/21.   - currently on metoprolol  succinate 75 mg daily (increased from 50mg  daily yesterday), SBP 140s, will increase metoprolol  succinate to 50mg  BID.   - pending echocardiogram  GI stromal tumor s/p exploratory laparotomy, debulking of metastatic GI stromal tumors with splenectomy, antrectomy, partial right hepatectomy, resection of peritoneal metastatic lesions, fulguration of diaphragmatic lesions with argon, placement of bilateral tube thoracostomy tubes: per surgery  R pneumothorax: chest tubes out. CXR shows "right chest tube removal with moderate pneumothorax that is increased from yesterday. Suggest  continued radiographic follow-up"      For questions or updates, please contact [facility] Please consult [email or web address] for contact info under        Signed, Ervin Heath, PA  02/05/2024, 10:05 AM    Patient seen, examined. Available data reviewed. Agree with findings, assessment, and plan as outlined by Ervin Heath, PA-C.  Patient independently interviewed and examined.  Alert, oriented, no distress.  HEENT normal, lungs clear bilaterally, heart tachycardic and regular with no murmur or gallop, abdomen soft with moderate tenderness in the right lower quadrant, extremities are without edema, skin is warm and dry without rash.  Her echocardiogram is reviewed and she has vigorous LV function with LVEF 70 to 75% and no valvular disease.  Normal RV function.  Plans noted for CT of the abdomen pelvis today to evaluate for hematoma.  We are increasing her beta-blocker to metoprolol  succinate 50 mg twice daily.  Otherwise no changes recommended.  Once she is fully recovered from surgery, it would be reasonable to place a 14-day ZIO monitor to screen for recurrent atrial fibrillation.  At this time would obviously avoid anticoagulation due to  bleeding risk after recent surgery.  She also has a relatively low CHA2DS2-VASc score of 2 for female gender and hypertension.  We will sign off today.  Please reach out if she has any recurrence of atrial fibrillation or other cardiovascular problems.  Will arrange outpatient posthospital cardiology follow-up in about 4 weeks.  Arnoldo Lapping, M.D. 02/05/2024 11:05 AM

## 2024-02-05 NOTE — Progress Notes (Signed)
 7 Days Post-Op   Subjective/Chief Complaint: Tachycardia improved.  Still having RUQ pain.  No SOB.    Objective: Vital signs in last 24 hours: Temp:  [97.7 F (36.5 C)-99 F (37.2 C)] 97.9 F (36.6 C) (05/22 0802) Pulse Rate:  [99-138] 120 (05/22 0700) Resp:  [15-27] 19 (05/22 0700) BP: (124-137)/(60-117) 124/60 (05/21 2000) SpO2:  [92 %-98 %] 93 % (05/22 0700) Last BM Date :  (PTA)  Intake/Output from previous day: 05/21 0701 - 05/22 0700 In: 120 [P.O.:120] Out: 30 [Drains:10; Chest Tube:20] Intake/Output this shift: No intake/output data recorded.  General:  alert and oriented.    Resp:  breathing comfortably CV:sl tachycardic, regular Abd:  soft, non distended, dressings c/d/I.  R drain serosang, ? Slight bile tinge Ext:  warm, well perfused, no edema.    Lab Results:  Recent Labs    02/04/24 0627 02/05/24 0533  WBC 23.5* 23.1*  HGB 9.4* 10.0*  HCT 29.6* 30.8*  PLT 693* 948*   BMET Recent Labs    02/04/24 0627 02/05/24 0533  NA 135 136  K 3.6 3.4*  CL 104 102  CO2 19* 25  GLUCOSE 106* 118*  BUN 7 7  CREATININE 0.49 0.63  CALCIUM  8.0* 8.2*   PT/INR No results for input(s): "LABPROT", "INR" in the last 72 hours.  ABG No results for input(s): "PHART", "HCO3" in the last 72 hours.  Invalid input(s): "PCO2", "PO2"   Studies/Results: DG CHEST PORT 1 VIEW Result Date: 02/04/2024 CLINICAL DATA:  Chest tube in place. EXAM: PORTABLE CHEST 1 VIEW COMPARISON:  Feb 04, 2024. FINDINGS: Stable right-sided chest tube with stable small right apical pneumothorax. Stable minimal left apical pneumothorax. IMPRESSION: Stable bilateral pneumothoraces as noted above. Electronically Signed   By: Rosalene Colon M.D.   On: 02/04/2024 13:59   DG CHEST PORT 1 VIEW Result Date: 02/04/2024 CLINICAL DATA:  Right-sided chest tube. EXAM: PORTABLE CHEST 1 VIEW COMPARISON:  Feb 03, 2024. FINDINGS: Stable cardiomediastinal silhouette. Left-sided chest tube has been removed.  Minimal left apical pneumothorax remains. Right-sided chest tube is again noted and unchanged. Stable small right apical pneumothorax is noted. Bony thorax is unremarkable. IMPRESSION: Left-sided chest tube has been removed. Minimal left apical pneumothorax is noted. Stable right-sided chest tube with stable small right apical pneumothorax. Electronically Signed   By: Rosalene Colon M.D.   On: 02/04/2024 09:42    Anti-infectives: Anti-infectives (From admission, onward)    Start     Dose/Rate Route Frequency Ordered Stop   01/29/24 2200  ceFAZolin  (ANCEF ) IVPB 2g/100 mL premix        2 g 200 mL/hr over 30 Minutes Intravenous Every 8 hours 01/29/24 1840 01/29/24 2153   01/29/24 0700  ceFAZolin  (ANCEF ) IVPB 2g/100 mL premix        2 g 200 mL/hr over 30 Minutes Intravenous On call to O.R. 01/29/24 0653 01/29/24 1331       Assessment/Plan: Postop day 7 s/p Exploratory laparotomy, debulking of metastatic GI stromal tumors with splenectomy, antrectomy with billroth II anastamosis, partial right hepatectomy, resection of peritoneal metastatic lesions including 3 cm pelvic mass, fulguration of diaphragmatic lesions with argon, placement of bilateral tube thoracostomy tubes, intraoperative ultrasound 01/29/2024 Nyshawn Gowdy for recurrent GI stromal tumor  Cards following tachycardia.  Increase metoprolol  Soft diet  Foley-out, no retention  Pain control - epidural out.   Effexor , PRN xanax   Tamoxifen  is on hold for VTE risk and has been off for 2 weeks  Chest tubes  out, CXR pending today.   Ambulate today and incentive spirometry today.  FEN-Na, K, Phos, Mag OK.    VTE prophylaxis - scds, restart lovenox  today  ABL anemia on anemia of chronic dx.  Better today.  Recheck tomorrow.  Looks like equilibrated.   Dispo:  echo pending.  Ct abd/pelvis today.    LOS: 7 days   Deliliah Fender, MD, FACS, FSSO Surgical Oncology and General Surgery [facility], [place] [phone] for  weekday/non holidays Check [email or web address] for coverage night/weekend/holidays    Lockie Rima 02/05/2024

## 2024-02-05 NOTE — Progress Notes (Signed)
 Physical Therapy Treatment Patient Details Name: Anne Horton MRN: [id number] DOB: July 14, 1976 Today's Date: 02/05/2024   History of Present Illness Pt is a 48 y.o. female who presented 01/29/24 for recurrent GI stromal tumor. S/p exploratory laparotomy, debulking of metastatic GI stromal tumors with splenectomy, antrectomy with billroth II anastamosis, partial right hepatectomy, resection of peritoneal metastatic lesions including 3 cm pelvic mass, fulguration of diaphragmatic lesions with argon, placement of bilateral tube thoracostomy tubes, intraoperative ultrasound 5/15. PMH: anemia, anxiety, breast cancer, depression, dysrhythmia, fibromyalgia, mitral regurgitation, neurofibromatosis    PT Comments  Pt making steady progress towards her physical therapy goals; able to ambulate unit with no assistive device this session and work on dynamic gait with head turns and lateral stepping. HR 121-150 bpm, SPO2 93% on RA, BP pre mobility 114/69 (84), post mobility 132/64. Will continue to follow acutely to progress as tolerated.    If plan is discharge home, recommend the following: A little help with walking and/or transfers;A little help with bathing/dressing/bathroom;Assistance with cooking/housework;Assist for transportation;Help with stairs or ramp for entrance   Can travel by private vehicle        Equipment Recommendations  None recommended by PT    Recommendations for Other Services       Precautions / Restrictions Precautions Precautions: Fall;Other (comment) Recall of Precautions/Restrictions: Intact Precaution/Restrictions Comments: abdominal precautions, abdominal binder, R JP drain, watch HR Restrictions Weight Bearing Restrictions Per Provider Order: No     Mobility  Bed Mobility               General bed mobility comments: OOB in chair    Transfers Overall transfer level: Needs assistance Equipment used: None Transfers: Sit to/from Stand Sit to Stand:  Supervision                Ambulation/Gait Ambulation/Gait assistance: Contact guard assist Gait Distance (Feet): 250 Feet Assistive device: None Gait Pattern/deviations: Step-through pattern, Decreased stride length       General Gait Details: Slow but steady, CGA for safety   Stairs             Wheelchair Mobility     Tilt Bed    Modified Rankin (Stroke Patients Only)       Balance Overall balance assessment: Needs assistance Sitting-balance support: Feet supported Sitting balance-Leahy Scale: Good     Standing balance support: No upper extremity supported, During functional activity Standing balance-Leahy Scale: Good                              Communication Communication Communication: No apparent difficulties  Cognition Arousal: Alert Behavior During Therapy: WFL for tasks assessed/performed   PT - Cognitive impairments: No apparent impairments                         Following commands: Intact      Cueing Cueing Techniques: Verbal cues  Exercises Other Exercises Other Exercises: Lateral stepping to R/L x 10 ft in each direction    General Comments        Pertinent Vitals/Pain Pain Assessment Pain Assessment: Faces Faces Pain Scale: Hurts little more Pain Location: R side of abdomen Pain Descriptors / Indicators: Discomfort, Grimacing, Guarding, Operative site guarding Pain Intervention(s): Limited activity within patient's tolerance, Monitored during session    Home Living  Prior Function            PT Goals (current goals can now be found in the care plan section) Acute Rehab PT Goals Potential to Achieve Goals: Good Progress towards PT goals: Progressing toward goals    Frequency    Min 2X/week      PT Plan      Co-evaluation              AM-PAC PT "6 Clicks" Mobility   Outcome Measure  Help needed turning from your back to your side while in a  flat bed without using bedrails?: None Help needed moving from lying on your back to sitting on the side of a flat bed without using bedrails?: A Little Help needed moving to and from a bed to a chair (including a wheelchair)?: A Little Help needed standing up from a chair using your arms (e.g., wheelchair or bedside chair)?: A Little Help needed to walk in hospital room?: A Little Help needed climbing 3-5 steps with a railing? : A Little 6 Click Score: 19    End of Session   Activity Tolerance: Patient tolerated treatment well Patient left: in chair;with call bell/phone within reach Nurse Communication: Mobility status;Other (comment) (HR) PT Visit Diagnosis: Unsteadiness on feet (R26.81);Other abnormalities of gait and mobility (R26.89);Difficulty in walking, not elsewhere classified (R26.2);Pain     Time: 1131-1144 PT Time Calculation (min) (ACUTE ONLY): 13 min  Charges:    $Therapeutic Activity: 8-22 mins PT General Charges $$ ACUTE PT VISIT: 1 Visit                     Verdia Glad, PT, DPT Acute Rehabilitation Services Office [phone]    Claria Crofts 02/05/2024, 1:25 PM

## 2024-02-05 NOTE — Progress Notes (Signed)
 Alerted by radiology a little while ago that patient CT scan showed a fairly moderate right-sided pneumothorax.  Her chest x-ray from early this a.m. also showed an enlarged right pneumothorax.  I contacted the nurse to see how the patient was doing.  She was resting comfortably.  No respiratory distress.  Stable vital signs for the patient.  No work of breathing.  Actually satting normal on room air  Reviewed her chest x-ray from this morning as well as her CT scan also reviewed chest x-ray from May 21  Patient had exploratory laparotomy with debulking of metastatic GIST and then some diaphragmatic implants that were resected and at the time of surgery bilateral pigtail chest tubes were placed.  Her chest tube was removed yesterday.  Patient has essentially gone all day with a stable size moderate right pneumothorax her injury was not traumatic in nature.  SHe is not in distress.  Vital signs are stable.  No increased work of breathing.  Normal sats on room air  Placed on some oxygen  I do not think she needs an urgent chest tube this evening But given the degree of lung collapse on the right I think she will need her chest tube replaced but I think it can wait till tomorrow morning Will repeat CXR in am  D/w nurse.  If patient develops some change in her vital signs overnight will prompt earlier intervention Will have CT supplies outside pt room on standby   Johngabriel Verde M. Elvan Hamel, MD, FACS General, Bariatric, & Minimally Invasive Surgery [facility],  [facility]

## 2024-02-06 ENCOUNTER — Inpatient Hospital Stay (HOSPITAL_COMMUNITY)

## 2024-02-06 LAB — CBC
HCT: 28.8 % — ABNORMAL LOW (ref 36.0–46.0)
Hemoglobin: 9.4 g/dL — ABNORMAL LOW (ref 12.0–15.0)
MCH: 29.9 pg (ref 26.0–34.0)
MCHC: 32.6 g/dL (ref 30.0–36.0)
MCV: 91.7 fL (ref 80.0–100.0)
Platelets: 1115 10*3/uL (ref 150–400)
RBC: 3.14 MIL/uL — ABNORMAL LOW (ref 3.87–5.11)
RDW: 15.1 % (ref 11.5–15.5)
WBC: 20.1 10*3/uL — ABNORMAL HIGH (ref 4.0–10.5)
nRBC: 0.3 % — ABNORMAL HIGH (ref 0.0–0.2)

## 2024-02-06 LAB — COMPREHENSIVE METABOLIC PANEL WITH GFR
ALT: 18 U/L (ref 0–44)
AST: 24 U/L (ref 15–41)
Albumin: 2.9 g/dL — ABNORMAL LOW (ref 3.5–5.0)
Alkaline Phosphatase: 69 U/L (ref 38–126)
Anion gap: 12 (ref 5–15)
BUN: 6 mg/dL (ref 6–20)
CO2: 24 mmol/L (ref 22–32)
Calcium: 8.3 mg/dL — ABNORMAL LOW (ref 8.9–10.3)
Chloride: 102 mmol/L (ref 98–111)
Creatinine, Ser: 0.49 mg/dL (ref 0.44–1.00)
GFR, Estimated: >60 mL/min (ref >60)
Glucose, Bld: 111 mg/dL — ABNORMAL HIGH (ref 70–99)
Potassium: 3.5 mmol/L (ref 3.5–5.1)
Sodium: 138 mmol/L (ref 135–145)
Total Bilirubin: 0.8 mg/dL (ref 0.0–1.2)
Total Protein: 5.5 g/dL — ABNORMAL LOW (ref 6.5–8.1)

## 2024-02-06 MED ORDER — METOPROLOL SUCCINATE ER 50 MG PO TB24
50.0000 mg | ORAL_TABLET | Freq: Two times a day (BID) | ORAL | 1 refills | Status: DC
  Filled 2024-02-06 – 2024-02-19 (×2): qty 90, 45d supply, fill #0

## 2024-02-06 MED ORDER — PANTOPRAZOLE SODIUM 40 MG PO TBEC
40.0000 mg | DELAYED_RELEASE_TABLET | Freq: Every day | ORAL | 2 refills | Status: AC
Start: 2024-02-07 — End: ?
  Filled 2024-02-06 – 2024-02-19 (×2): qty 30, 30d supply, fill #0
  Filled 2024-03-16 – 2024-03-17 (×2): qty 30, 30d supply, fill #1
  Filled 2024-04-19: qty 30, 30d supply, fill #2

## 2024-02-06 MED ORDER — PANTOPRAZOLE SODIUM 40 MG PO TBEC
40.0000 mg | DELAYED_RELEASE_TABLET | Freq: Every day | ORAL | Status: DC
  Administered 2024-02-06 – 2024-02-19 (×12): 40 mg via ORAL
  Filled 2024-02-06 (×11): qty 1

## 2024-02-06 MED ORDER — ASPIRIN 81 MG PO TBEC
81.0000 mg | DELAYED_RELEASE_TABLET | Freq: Every day | ORAL | 12 refills | Status: DC
  Filled 2024-02-06 – 2024-02-19 (×2): qty 30, 30d supply, fill #0
  Filled 2024-03-16 – 2024-03-17 (×2): qty 30, 30d supply, fill #1
  Filled 2024-04-19: qty 30, 30d supply, fill #2
  Filled 2024-05-10: qty 30, 30d supply, fill #3

## 2024-02-06 MED ORDER — OXYCODONE HCL 5 MG PO TABS
5.0000 mg | ORAL_TABLET | ORAL | 0 refills | Status: DC | PRN
  Filled 2024-02-06 – 2024-02-19 (×2): qty 50, 5d supply, fill #0

## 2024-02-06 MED ORDER — PROCHLORPERAZINE MALEATE 10 MG PO TABS
10.0000 mg | ORAL_TABLET | Freq: Four times a day (QID) | ORAL | 0 refills | Status: DC | PRN
  Filled 2024-02-06 – 2024-02-19 (×2): qty 30, 8d supply, fill #0

## 2024-02-06 MED ORDER — METHOCARBAMOL 500 MG PO TABS
500.0000 mg | ORAL_TABLET | Freq: Three times a day (TID) | ORAL | 2 refills | Status: AC
  Filled 2024-02-06 – 2024-02-19 (×2): qty 40, 14d supply, fill #0
  Filled 2024-10-06: qty 40, 14d supply, fill #1

## 2024-02-06 MED ORDER — SENNA 8.6 MG PO TABS
1.0000 | ORAL_TABLET | Freq: Two times a day (BID) | ORAL | Status: DC
  Administered 2024-02-06 – 2024-02-12 (×11): 8.6 mg via ORAL
  Filled 2024-02-06 (×11): qty 1

## 2024-02-06 MED ORDER — POLYETHYLENE GLYCOL 3350 17 G PO PACK
17.0000 g | PACK | Freq: Every day | ORAL | Status: DC
  Administered 2024-02-06 – 2024-02-09 (×4): 17 g via ORAL
  Filled 2024-02-06 (×6): qty 1

## 2024-02-06 MED ORDER — POLYETHYLENE GLYCOL 3350 17 GM/SCOOP PO POWD
17.0000 g | Freq: Every day | ORAL | 0 refills | Status: DC
  Filled 2024-02-06: qty 238, 14d supply, fill #0

## 2024-02-06 NOTE — Discharge Summary (Signed)
 Physician Discharge Summary  Patient ID: Anne Horton MRN: [id number] DOB/AGE: 11-29-1975 48 y.o.  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  She went to the operating room for debulking and had antrectomy, splenectomy, partial right hepatectomy, resection of 2 pelvic masses, resection of multiple peritoneal metastases, repair of diaphragm bilaterally, placement of bilateral chest tubes.  She was sent to the ICU.  She had an epidural with Dilaudid  PCA for pain control.  She was kept n.p.o. with NG tube for 2 days and then this was removed.  She had some nausea and diet was advanced slowly from ice chips to sips of clears.  She did not have any lung injury, but did have diaphragmatic injury due to the tumor adherence to the diaphragm from both sides.  The right-sided pneumothorax especially was quite resistant.  The left chest tube was able to be removed first.  Left JP was removed.  Epidural was taken out on postop day 5.  Postop day 6, patient had episode of significant tachycardia with heart rate up into the 180s to 200s.  She had been walking around earlier, but when this occurred she was actually at rest.  She was given 2 doses of IV metoprolol  and the heart rate came down into the 120s.  It did appear that some of her strips showed atrial fibrillation, but  this spontaneously converted to sinus tach.  She had baseline palpitations and had been on beta-blockade throughout her hospitalization.  She was on metoprolol  at her home dose when this incident occurred.  The dose was increased after this.    Given the stability of the right pneumothorax, the right chest tube was pulled.  Szymon stable for multiple days.  No change in vital signs occurred.  However, the next morning following for dropped some more.  Again no change in vital signs occurred.  A CT abdomen pelvis was obtained as her white count continued to rise.  No abscess was seen.  Unfortunately, the pelvic mass that was not reachable from the upper abdomen appeared to be getting larger on the scan.  The pneumothorax was not treated with a chest tube given that this was not resulting from lung injury and was resulting from diaphragmatic trauma into her abdominally.  The morning of postop day 8 the pneumothorax did appear to be getting smaller.  Patient's vitals continue to improve.  The patient denies shortness of breath and was ambulatory on room air.   Patient's second drain was removed on postop day 8.  Patient was observed to assess for continued stability of the right pneumothorax.  Unfortunately, patient's pneumothorax worsened and she required replacement of right pigtail chest tube.  Also, she developed n/v and required TPN.  She had upper gi showing adequate motility of contrast through stomach into jejunum without obstruction but still was unable to take in enough calories.    She is sent home on TPN on 02/19/24.    Consults: cardiology and anesthesia  Significant Diagnostic Studies:  labs: on discharge CMET essentially normal and WBCs 21 (but stable), HCT 28, upper GI showed good motility of contrast through stomach into duodenum.    Treatments: surgery: see above Beta blockade Chest tube  Discharge Exam: Blood pressure (!) 108/47, pulse 81, temperature 98 F (36.7 C), temperature  source Oral, resp. rate 16, height 5' 2 (1.575 m), weight 58.2 kg, SpO2 98%. General appearance: alert and no distress Resp: breathing comfortably Cardio: regular rate and rhythm GI: soft, non distended, non tender.    Disposition:    Allergies as of 02/18/2024       Reactions   Iron  Sucrose Hives, Rash, Other (See Comments)   Respiratory Distress (ALLERGY/intolerance) seizure like activity   Other    Dermabond - hives and blisters    Latex Rash        Medication List     STOP taking these medications    imatinib  400 MG tablet Commonly known as: GLEEVEC    multivitamin tablet       TAKE these medications    acetaminophen  500 MG tablet Commonly known as: TYLENOL  Take 1,000 mg by mouth every 6 (six) hours as needed for moderate pain (pain score 4-6).   ALPRAZolam  0.5 MG tablet Commonly known as: XANAX  Take 1/2 to 1 tablet by mouth 2 times a day as needed What changed: Another medication with the same name was removed. Continue taking this medication, and follow the directions you see here.   alum & mag hydroxide-simeth 200-200-20 MG/5ML suspension Commonly known as: MAALOX/MYLANTA Take 30 mLs by mouth every 4 (four) hours as needed for indigestion or heartburn.   aspirin  EC 81 MG tablet Take 1 tablet (81 mg total) by mouth daily. Swallow whole.   CALCIUM  PO Take 2 tablets by mouth daily at 6 (six) AM. GUMMY   cetirizine 10 MG tablet Commonly known as: ZYRTEC Take 10 mg by mouth daily.   feeding supplement Liqd Take 237 mLs by mouth 2 (two) times daily between meals. Start taking on: February 19, 2024   Hair Skin & Nails Tabs Take 2 tablets by mouth daily.   ibuprofen  200 MG tablet Commonly known as: ADVIL  Take 800 mg by mouth every 6 (six) hours as needed for moderate pain (pain score 4-6).   methocarbamol  500 MG tablet Commonly known as: ROBAXIN  Take 1 tablet (500 mg total) by mouth 3 (three) times daily.   metoprolol  succinate 50 MG 24 hr  tablet Commonly known as: TOPROL -XL Take 1 tablet (50 mg total) by mouth in the morning and at bedtime. What changed: when to take this   ondansetron  8 MG tablet Commonly known as: ZOFRAN  Take 1 tablet (8 mg total) by mouth every 8 (eight) hours as needed for nausea or vomiting. What changed: Another medication with the same name was removed. Continue taking this medication, and follow the directions you see here.   oxyCODONE  5 MG immediate release tablet Commonly known as: Oxy IR/ROXICODONE  Take 1-2 tablets (5-10 mg total) by mouth every 4 (four) hours as needed for moderate pain (pain score 4-6).   pantoprazole  40 MG tablet Commonly known as: PROTONIX  Take 1 tablet (40 mg total) by mouth daily at 12 noon.   polyethylene glycol powder 17 GM/SCOOP powder Commonly known as: GLYCOLAX /MIRALAX  Mix 1 capful (17 g) and drink by mouth daily.   polyethylene glycol 17 g packet Commonly known as: MIRALAX  / GLYCOLAX  Take 17 g by mouth daily as needed for mild constipation.   prochlorperazine  10 MG  tablet Commonly known as: COMPAZINE  Take 1 tablet (10 mg total) by mouth every 6 (six) hours as needed for nausea or vomiting (Use for nausea and / or vomiting unresolved with ondansetron  (Zofran ).   propranolol  10 MG tablet Commonly known as: INDERAL  TAKE 1 TABLET BY MOUTH FOUR TIMES DAILY AS NEEDED FOR PALPITATIONS   sennosides-docusate sodium  8.6-50 MG tablet Commonly known as: SENOKOT-S Take 1 tablet by mouth 2 (two) times daily.   sodium chloride  flush 0.9 % Soln Commonly known as: NS 10 mLs by Intracatheter route as needed (flush).   tamoxifen  20 MG tablet Commonly known as: NOLVADEX  TAKE 1 TABLET BY MOUTH ONCE A DAY   venlafaxine  XR 75 MG 24 hr capsule Commonly known as: EFFEXOR -XR Take 1 capsule (75 mg total) by mouth daily. What changed: Another medication with the same name was removed. Continue taking this medication, and follow the directions you see here.          Follow-up Information     Ervin Heath, [place] Follow up on 03/17/2024.   Specialties: Cardiology, Radiology Why: 1:55PM. Cardiology follow up Contact information: [address] [place] [address] [phone]         Lockie Rima, MD Follow up on 02/23/2024.   Specialty: General Surgery Why: and 1 week for nurse visit for staple removal Contact information: [address] [place] [address] [phone]                 Signed: Lockie Rima 02/18/2024, 6:23 PM

## 2024-02-06 NOTE — Progress Notes (Signed)
 Patient is a transfer from 4NICU to 4NP14. patient is alert and is able to voice her own needs. Patient was made comfortable in her room with call bell within reach and bed at lowest position. No acute distress noted at this time. Spouse at bedside. Will continue to monitor.

## 2024-02-06 NOTE — Discharge Instructions (Signed)
 [facility]      [facility], [place] [phone]  ABDOMINAL SURGERY: POST OP INSTRUCTIONS  Always review your discharge instruction sheet given to you by the facility where your surgery was performed.  IF YOU HAVE DISABILITY OR FAMILY LEAVE FORMS, YOU MUST BRING THEM TO THE OFFICE FOR PROCESSING.  PLEASE DO NOT GIVE THEM TO YOUR DOCTOR.  A prescription for pain medication may be given to you upon discharge.  Take your pain medication as prescribed, if needed.  If narcotic pain medicine is not needed, then you may take acetaminophen (Tylenol) or ibuprofen (Advil) as needed. Take your usually prescribed medications unless otherwise directed. If you need a refill on your pain medication, please contact your pharmacy. They will contact our office to request authorization.  Prescriptions will not be filled after 5pm or on week-ends. You should follow a light diet the first few days after arrival home, such as soup and crackers, pudding, etc.unless your doctor has advised otherwise. A high-fiber, low fat diet can be resumed as tolerated.   Be sure to include lots of fluids daily. Most patients will experience some swelling and bruising on the chest and neck area.  Ice packs will help.  Swelling and bruising can take several days to resolve Most patients will experience some swelling and bruising in the area of the incision. Ice pack will help. Swelling and bruising can take several days to resolve..  It is common to experience some constipation if taking pain medication after surgery.  Increasing fluid intake and taking a stool softener will usually help or prevent this problem from occurring.  A mild laxative (Milk of Magnesia or Miralax) should be taken according to package directions if there are no bowel movements after 48 hours.  You may have steri-strips (small skin tapes) in place directly over the incision.  These strips should be left on the skin for 10-14 days.  If your surgeon used skin glue on  the incision, you may shower in 48 hours.  The glue will flake off over the next 2-3 weeks.  Any sutures or staples will be removed at the office during your follow-up visit. You may find that a light gauze bandage over your incision may keep your staples from being rubbed or pulled. You may shower and replace the bandage daily. ACTIVITIES:  You may resume regular (light) daily activities beginning the next day--such as daily self-care, walking, climbing stairs--gradually increasing activities as tolerated.  You may have sexual intercourse when it is comfortable.  Refrain from any heavy lifting or straining until approved by your doctor. You may drive when you no longer are taking prescription pain medication, you can comfortably wear a seatbelt, and you can safely maneuver your car and apply brakes Return to Work: __________8 weeks if applicable_________________________ Anne Horton should see your doctor in the office for a follow-up appointment approximately two weeks after your surgery.  Make sure that you call for this appointment within a day or two after you arrive home to insure a convenient appointment time. OTHER INSTRUCTIONS:  _____________________________________________________________ _____________________________________________________________  WHEN TO CALL YOUR DOCTOR: Fever over 101.0 Inability to urinate Nausea and/or vomiting Extreme swelling or bruising Continued bleeding from incision. Increased pain, redness, or drainage from the incision. Difficulty swallowing or breathing Muscle cramping or spasms. Numbness or tingling in hands or feet or around lips.  The clinic staff is available to answer your questions during regular business hours.  Please don't hesitate to call and ask to speak to  one of the nurses if you have concerns.  For further questions, please visit [email or web address]

## 2024-02-06 NOTE — Progress Notes (Signed)
 Occupational Therapy Treatment Patient Details Name: Anne Horton MRN: [id number] DOB: [birth year] Today's Date: 02/06/2024   History of present illness Pt is a 48 y.o. female who presented 01/29/24 for recurrent GI stromal tumor. S/p exploratory laparotomy, debulking of metastatic GI stromal tumors with splenectomy, antrectomy with billroth II anastamosis, partial right hepatectomy, resection of peritoneal metastatic lesions including 3 cm pelvic mass, fulguration of diaphragmatic lesions with argon, placement of bilateral tube thoracostomy tubes, intraoperative ultrasound 5/15. PMH: anemia, anxiety, breast cancer, depression, dysrhythmia, fibromyalgia, mitral regurgitation, neurofibromatosis   OT comments  RN helping address bed issues on arrival due to air loss in the mattress. Pt verbalized concerns with stairs upon d/c. Pt educated on energy conservation, completed stair transfer at adequate level for home d/c and placement of chair for rest if needed. Pt verbalized understanding and thanked therapist for handout. Recommendation for hHOT      If plan is discharge home, recommend the following:  Assistance with cooking/housework;Assist for transportation;Help with stairs or ramp for entrance;A little help with walking and/or transfers;A little help with bathing/dressing/bathroom   Equipment Recommendations  BSC/3in1;Other (comment)    Recommendations for Other Services PT consult    Precautions / Restrictions Precautions Precautions: Fall;Other (comment) Precaution/Restrictions Comments: abdominal precautions, abdominal binder watch HR       Mobility Bed Mobility Overal bed mobility: Modified Independent                  Transfers Overall transfer level: Needs assistance   Transfers: Sit to/from Stand Sit to Stand: Supervision                 Balance             Standing balance-Leahy Scale: Good                             ADL either  performed or assessed with clinical judgement   ADL Overall ADL's : Needs assistance/impaired     Grooming: Modified independent;Wash/dry face;Sitting                   Toilet Transfer: Supervision/safety           Functional mobility during ADLs: Supervision/safety General ADL Comments: completed 5 stairs with use of R rail HR . pt with x2 rest breaks during transfer to the stair case to help with energy conservation. handout of 5 Ps for energy conservation provided    Extremity/Trunk Assessment Upper Extremity Assessment Upper Extremity Assessment: Overall WFL for tasks assessed   Lower Extremity Assessment Lower Extremity Assessment: Defer to PT evaluation        Vision   Vision Assessment?: No apparent visual deficits   Perception     Praxis     Communication     Cognition Arousal: Alert Behavior During Therapy: WFL for tasks assessed/performed Cognition: No apparent impairments                                        Cueing      Exercises      Shoulder Instructions       General Comments HR 110 max durings session RA 93% or better. worked on pursed lip breathing to help monitor HR as prior sessions elevated HR    Pertinent Vitals/ Pain       Pain Assessment Pain Assessment:  Faces Faces Pain Scale: Hurts little more Pain Location: abdomen Pain Descriptors / Indicators: Discomfort, Grimacing, Guarding, Operative site guarding Pain Intervention(s): Monitored during session, Repositioned  Home Living                                          Prior Functioning/Environment              Frequency  Min 2X/week        Progress Toward Goals  OT Goals(current goals can now be found in the care plan section)  Progress towards OT goals: Progressing toward goals  Acute Rehab OT Goals Patient Stated Goal: to not have nausea about 2pm everyday OT Goal Formulation: With patient Time For Goal Achievement:  02/14/24 Potential to Achieve Goals: Good ADL Goals Pt Will Perform Grooming: with supervision;standing Pt Will Perform Upper Body Bathing: sitting;with adaptive equipment;with supervision Pt Will Perform Lower Body Bathing: with set-up;with adaptive equipment;sitting/lateral leans Pt Will Perform Upper Body Dressing: with modified independence;sitting Pt Will Perform Lower Body Dressing: with contact guard assist;sit to/from stand;with adaptive equipment Pt Will Transfer to Toilet: with modified independence;ambulating Pt Will Perform Toileting - Clothing Manipulation and hygiene: with set-up;sitting/lateral leans;sit to/from stand  Plan      Co-evaluation                 AM-PAC OT "6 Clicks" Daily Activity     Outcome Measure   Help from another person eating meals?: None Help from another person taking care of personal grooming?: A Little Help from another person toileting, which includes using toliet, bedpan, or urinal?: A Little Help from another person bathing (including washing, rinsing, drying)?: A Little Help from another person to put on and taking off regular upper body clothing?: A Little Help from another person to put on and taking off regular lower body clothing?: A Little 6 Click Score: 19    End of Session Equipment Utilized During Treatment: Gait belt  OT Visit Diagnosis: Unsteadiness on feet (R26.81);Other abnormalities of gait and mobility (R26.89);Muscle weakness (generalized) (M62.81);Pain   Activity Tolerance Patient tolerated treatment well   Patient Left in bed;with call bell/phone within reach;with bed alarm set   Nurse Communication Mobility status;Precautions        Time: 1450-1505 OT Time Calculation (min): 15 min  Charges: OT General Charges $OT Visit: 1 Visit OT Treatments $Self Care/Home Management : 8-22 mins   Anne Horton, OTR/L  Acute Rehabilitation Services Office: [phone] .   Anne Horton Anne Horton 02/06/2024, 3:12 PM

## 2024-02-06 NOTE — Progress Notes (Signed)
 8 Days Post-Op   Subjective/Chief Complaint: Ct scan yesterday.  No abscess seen.  Still passing gas.  Has had small bms. No big BM.  Tolerating soft diet.    After CT pull, right PTX larger, but no resp distress.    Objective: Vital signs in last 24 hours: Temp:  [97.8 F (36.6 C)-99.6 F (37.6 C)] 98.8 F (37.1 C) (05/23 0800) Pulse Rate:  [96-110] 96 (05/23 0800) Resp:  [15-23] 23 (05/23 0800) BP: (122-140)/(56-73) 127/62 (05/23 0800) SpO2:  [92 %-100 %] 95 % (05/23 0800) Last BM Date :  (PTA)  Intake/Output from previous day: 05/22 0701 - 05/23 0700 In: 90 [P.O.:90] Out: 28 [Drains:28] Intake/Output this shift: No intake/output data recorded.  General:  alert and oriented.   In recliner.   Resp:  breathing comfortably CV:sl tachycardic, regular Abd:  soft, non distended, dressings c/d/I.  R drain serosang, no bile ting.  Minimal output.  Ext:  warm, well perfused, no edema.    Lab Results:  Recent Labs    02/05/24 0533 02/06/24 0703  WBC 23.1* 20.1*  HGB 10.0* 9.4*  HCT 30.8* 28.8*  PLT 948* 1,115*   BMET Recent Labs    02/05/24 0533 02/06/24 0703  NA 136 138  K 3.4* 3.5  CL 102 102  CO2 25 24  GLUCOSE 118* 111*  BUN 7 6  CREATININE 0.63 0.49  CALCIUM  8.2* 8.3*   PT/INR No results for input(s): "LABPROT", "INR" in the last 72 hours.  ABG No results for input(s): "PHART", "HCO3" in the last 72 hours.  Invalid input(s): "PCO2", "PO2"   Studies/Results: CT ABDOMEN PELVIS W CONTRAST Addendum Date: 02/05/2024 ADDENDUM REPORT: 02/05/2024 21:31 ADDENDUM: Critical Value/emergent results were called by telephone at the time of interpretation on 02/05/2024 at 9:29 pm to provider Aldean Hummingbird, who verbally acknowledged these results. Electronically Signed   By: Wyvonnia Heimlich M.D.   On: 02/05/2024 21:31   Result Date: 02/05/2024 CLINICAL DATA:  Postoperative abdominal pain, concern for abscess. EXAM: CT ABDOMEN AND PELVIS WITH CONTRAST TECHNIQUE:  Multidetector CT imaging of the abdomen and pelvis was performed using the standard protocol following bolus administration of intravenous contrast. RADIATION DOSE REDUCTION: This exam was performed according to the departmental dose-optimization program which includes automated exposure control, adjustment of the mA and/or kV according to patient size and/or use of iterative reconstruction technique. CONTRAST:  75mL OMNIPAQUE  IOHEXOL  350 MG/ML SOLN COMPARISON:  01/08/2024. FINDINGS: Lower chest: There are small bilateral pleural effusions with atelectasis or infiltrate at the lung bases. A trace pericardial effusion is noted. Eight pneumothorax is noted on the right and there is suspicion for possible pneumomediastinum. Hepatobiliary: Surgical changes are present in the posterior right lobe of the liver. The gallbladder is surgically absent. No biliary ductal dilatation is seen. Pancreas: Unremarkable. No pancreatic ductal dilatation or surrounding inflammatory changes. Spleen: Surgically absent. Adrenals/Urinary Tract: Adrenal glands are within normal limits. The kidneys enhance symmetrically. No renal calculus or hydronephrosis bilaterally. There is questionable mild wall are wall thickening which may be related to under distension. Stomach/Bowel: Surgical changes are present at the gastric antrum and proximal jejunum. No bowel obstruction, free air, or pneumatosis is seen. The appendix is not seen. Vascular/Lymphatic: No significant vascular findings are present. Prominent lymph nodes are noted along the external iliac chain on the right measuring up to 1 cm. Reproductive: Status post hysterectomy. Multiple masses are identified in the pelvis measuring up to 6.3 x 5.0 cm, increased in size from the  prior exam. Other: A small amount of ascites is present in the abdomen and pelvis. Surgical changes are noted in the abdomen. A drain courses in the posterior aspect of the right lobe of the liver. No abscess is seen.  Musculoskeletal: The bony structures are stable. No acute osseous abnormality is seen. IMPRESSION: 1. Postsurgical changes involving the posterior aspect the right lobe of the liver and splenectomy with surgical drain terminating in the right upper quadrant. Mild mesenteric edema and small ascites is noted. No free air or abscess is seen. 2. Moderate right pneumothorax and possible small pneumomediastinum. 3. Interval enlargement of pelvic masses, now measuring up to 6.3 cm. 4. Small bilateral pleural effusions with atelectasis. 5. Mild bladder wall thickening, which may be infectious or inflammatory. Electronically Signed: By: Wyvonnia Heimlich M.D. On: 02/05/2024 21:18   ECHOCARDIOGRAM COMPLETE Result Date: 02/05/2024    ECHOCARDIOGRAM REPORT   Patient Name:   Patrcia Schnepp [facility] Date of Exam: 02/05/2024 Medical Rec #:  [id number]          Height:       62.0 in Accession #:    [id number]         Weight:       132.0 lb Date of Birth:  [birth year]           BSA:          1.602 m Patient Age:    48 years           BP:           144/65 mmHg Patient Gender: F                  HR:           119 bpm. Exam Location:  Inpatient Procedure: 2D Echo, Cardiac Doppler and Color Doppler (Both Spectral and Color            Flow Doppler were utilized during procedure). Indications:    Atrial fibrillation  History:        Patient has no prior history of Echocardiogram examinations.  Sonographer:    Janette Medley Referring Phys: Cyrilla Drivers PARCELLS IMPRESSIONS  1. Left ventricular ejection fraction, by estimation, is 70 to 75%. The left ventricle has hyperdynamic function. The left ventricle has no regional wall motion abnormalities. There is mild left ventricular hypertrophy. Left ventricular diastolic parameters were normal.  2. Right ventricular systolic function is normal. The right ventricular size is normal.  3. A small pericardial effusion is present.  4. The mitral valve is normal in structure. No evidence of mitral valve  regurgitation. No evidence of mitral stenosis.  5. The aortic valve is normal in structure. Aortic valve regurgitation is not visualized. No aortic stenosis is present.  6. The inferior vena cava is normal in size with greater than 50% respiratory variability, suggesting right atrial pressure of 3 mmHg. FINDINGS  Left Ventricle: Left ventricular ejection fraction, by estimation, is 70 to 75%. The left ventricle has hyperdynamic function. The left ventricle has no regional wall motion abnormalities. The left ventricular internal cavity size was normal in size. There is mild left ventricular hypertrophy. Left ventricular diastolic parameters were normal. Right Ventricle: The right ventricular size is normal. No increase in right ventricular wall thickness. Right ventricular systolic function is normal. Left Atrium: Left atrial size was normal in size. Right Atrium: Right atrial size was normal in size. Pericardium: A small pericardial effusion is present. Mitral Valve: The mitral valve is normal  in structure. No evidence of mitral valve regurgitation. No evidence of mitral valve stenosis. Tricuspid Valve: The tricuspid valve is normal in structure. Tricuspid valve regurgitation is not demonstrated. No evidence of tricuspid stenosis. Aortic Valve: The aortic valve is normal in structure. Aortic valve regurgitation is not visualized. No aortic stenosis is present. Aortic valve mean gradient measures 6.0 mmHg. Aortic valve peak gradient measures 12.2 mmHg. Aortic valve area, by VTI measures 2.80 cm. Pulmonic Valve: The pulmonic valve was normal in structure. Pulmonic valve regurgitation is not visualized. No evidence of pulmonic stenosis. Aorta: The aortic root is normal in size and structure. Venous: The inferior vena cava is normal in size with greater than 50% respiratory variability, suggesting right atrial pressure of 3 mmHg. IAS/Shunts: No atrial level shunt detected by color flow Doppler.  LEFT VENTRICLE PLAX 2D  LVIDd:         3.40 cm   Diastology LVIDs:         1.90 cm   LV e' medial:    7.18 cm/s LV PW:         1.10 cm   LV E/e' medial:  11.7 LV IVS:        1.20 cm   LV e' lateral:   12.00 cm/s LVOT diam:     2.00 cm   LV E/e' lateral: 7.0 LV SV:         67 LV SV Index:   42 LVOT Area:     3.14 cm  RIGHT VENTRICLE RV S prime:     20.60 cm/s TAPSE (M-mode): 1.6 cm LEFT ATRIUM             Index        RIGHT ATRIUM          Index LA diam:        2.60 cm 1.62 cm/m   RA Area:     8.15 cm LA Vol (A2C):   15.6 ml 9.74 ml/m   RA Volume:   13.60 ml 8.49 ml/m LA Vol (A4C):   21.4 ml 13.36 ml/m LA Biplane Vol: 19.0 ml 11.86 ml/m  AORTIC VALVE AV Area (Vmax):    2.76 cm AV Area (Vmean):   2.84 cm AV Area (VTI):     2.80 cm AV Vmax:           175.00 cm/s AV Vmean:          116.000 cm/s AV VTI:            0.239 m AV Peak Grad:      12.2 mmHg AV Mean Grad:      6.0 mmHg LVOT Vmax:         154.00 cm/s LVOT Vmean:        105.000 cm/s LVOT VTI:          0.213 m LVOT/AV VTI ratio: 0.89  AORTA Ao Root diam: 2.20 cm Ao Asc diam:  2.30 cm MITRAL VALVE MV Area (PHT): 5.13 cm     SHUNTS MV Decel Time: 148 msec     Systemic VTI:  0.21 m MV E velocity: 83.80 cm/s   Systemic Diam: 2.00 cm MV A velocity: 111.00 cm/s MV E/A ratio:  0.75 Aditya Sabharwal Electronically signed by Alwin Baars Signature Date/Time: 02/05/2024/10:58:50 AM    Final    DG CHEST PORT 1 VIEW Result Date: 02/05/2024 CLINICAL DATA:  Encounter for chest tube removal EXAM: PORTABLE CHEST 1 VIEW COMPARISON:  Yesterday FINDINGS: Increased right pneumothorax  after chest tube removal, 3.5 cm in thickness compared to 1.2 cm at the apex. Allowing for slight rotation, no convincing mediastinal shift. No diaphragm depression. The lungs appear clear. Normal heart size. Drain over the right upper quadrant. ASAP, these results will be called to the ordering clinician or representative by the Radiologist Assistant, and communication documented in the PACS or [employer]. IMPRESSION: Right chest tube removal with moderate pneumothorax that is increased from yesterday. Suggest continued radiographic follow-up to differentiate air leak from introduced air during chest tube removal. Electronically Signed   By: Ronnette Coke M.D.   On: 02/05/2024 09:32   DG CHEST PORT 1 VIEW Result Date: 02/04/2024 CLINICAL DATA:  Chest tube in place. EXAM: PORTABLE CHEST 1 VIEW COMPARISON:  Feb 04, 2024. FINDINGS: Stable right-sided chest tube with stable small right apical pneumothorax. Stable minimal left apical pneumothorax. IMPRESSION: Stable bilateral pneumothoraces as noted above. Electronically Signed   By: Rosalene Colon M.D.   On: 02/04/2024 13:59    Anti-infectives: Anti-infectives (From admission, onward)    Start     Dose/Rate Route Frequency Ordered Stop   01/29/24 2200  ceFAZolin  (ANCEF ) IVPB 2g/100 mL premix        2 g 200 mL/hr over 30 Minutes Intravenous Every 8 hours 01/29/24 1840 01/29/24 2153   01/29/24 0700  ceFAZolin  (ANCEF ) IVPB 2g/100 mL premix        2 g 200 mL/hr over 30 Minutes Intravenous On call to O.R. 01/29/24 0653 01/29/24 1331       Assessment/Plan: Postop day 8 s/p Exploratory laparotomy, debulking of metastatic GI stromal tumors with splenectomy, antrectomy with billroth II anastamosis, partial right hepatectomy, resection of peritoneal metastatic lesions including 3 cm pelvic mass, fulguration of diaphragmatic lesions with argon, placement of bilateral tube thoracostomy tubes, intraoperative ultrasound 01/29/2024 [facility] for recurrent GI stromal tumor  Cards following tachycardia.  Increased metoprolol  yesterday. Echo looks ok.  Soft diet, carafate , protonix. D/c right JP.   Foley-out, no retention  Pain control - epidural out.  Prn oxy, robaxin . Dilaudid  for breakthrough Effexor , PRN xanax  anxiety/hot flashes   Tamoxifen  is on hold for VTE risk and has been off for 2 weeks  Chest tubes out, right ptx looks sl smaller  today.    Ambulate today and incentive spirometry today.  FEN-Na, K, Phos, Mag OK.    VTE prophylaxis - scds, lovenox .  ASA added for thrombocytosis.  ABL anemia on anemia of chronic dx.  Stable.   Dispo:  main issue keeping her here is right PTX>  this is not from lung injury but from diaphragmatic entry from tumor involvement.  Looks slightly smaller today and pt is stable.  Will repeat CXR tomorrow. Hopefully this will continue to shrink down and will be able to d/c.  If not, patient may need replacement of pigtail.      LOS: 8 days   Deliliah Fender, MD, FACS, FSSO Surgical Oncology and General Surgery [facility], [place] [phone] for weekday/non holidays Check [email or web address] for coverage night/weekend/holidays    Lockie Rima 02/06/2024

## 2024-02-07 ENCOUNTER — Inpatient Hospital Stay (HOSPITAL_COMMUNITY)

## 2024-02-07 ENCOUNTER — Other Ambulatory Visit (HOSPITAL_COMMUNITY): Payer: Self-pay

## 2024-02-07 ENCOUNTER — Encounter: Payer: Self-pay | Admitting: Hematology and Oncology

## 2024-02-07 NOTE — Plan of Care (Signed)

## 2024-02-07 NOTE — Progress Notes (Signed)
 9 Days Post-Op   Subjective/Chief Complaint: Small BM this AM. Some tightness with deep breaths, pulling 750 on IS.   Objective: Vital signs in last 24 hours: Temp:  [97.9 F (36.6 C)-99.1 F (37.3 C)] 98.2 F (36.8 C) (05/24 0800) Pulse Rate:  [95-102] 95 (05/24 0800) Resp:  [13-22] 18 (05/24 0800) BP: (112-136)/(54-64) 112/54 (05/24 0800) SpO2:  [93 %-100 %] 95 % (05/24 0800) Last BM Date :  (per patient small BM a few days ago)  Intake/Output from previous day: 05/23 0701 - 05/24 0700 In: 32.4 [I.V.:32.4] Out: 60 [Emesis/NG output:60] Intake/Output this shift: No intake/output data recorded.  General:  alert and oriented. NAD   Resp:  breathing comfortably CV:sl tachycardic, regular Abd:  soft, non distended, dressings c/d/I.   Ext:  warm, well perfused, no edema.    Lab Results:  Recent Labs    02/05/24 0533 02/06/24 0703  WBC 23.1* 20.1*  HGB 10.0* 9.4*  HCT 30.8* 28.8*  PLT 948* 1,115*   BMET Recent Labs    02/05/24 0533 02/06/24 0703  NA 136 138  K 3.4* 3.5  CL 102 102  CO2 25 24  GLUCOSE 118* 111*  BUN 7 6  CREATININE 0.63 0.49  CALCIUM  8.2* 8.3*   PT/INR No results for input(s): "LABPROT", "INR" in the last 72 hours.  ABG No results for input(s): "PHART", "HCO3" in the last 72 hours.  Invalid input(s): "PCO2", "PO2"   Studies/Results: DG CHEST PORT 1 VIEW Result Date: 02/07/2024 CLINICAL DATA:  Follow-up pneumothorax. EXAM: PORTABLE CHEST 1 VIEW COMPARISON:  02/06/2024 FINDINGS: Previous right-sided pneumothorax is again noted measuring 2.3 cm in thickness over the right upper lobe. This is compared with 3.5 cm previously. Stable cardiomediastinal contours. Small left pleural effusion, unchanged. Surgical clips noted in the left axilla. IMPRESSION: 1. Interval decrease in size of right-sided pneumothorax. 2. Small left pleural effusion, unchanged. Electronically Signed   By: Kimberley Penman M.D.   On: 02/07/2024 09:24   DG CHEST PORT 1  VIEW Result Date: 02/06/2024 CLINICAL DATA:  Follow-up right pneumothorax. EXAM: PORTABLE CHEST 1 VIEW COMPARISON:  02/05/2024 FINDINGS: The previously demonstrated right pneumothorax measuring 3.5 cm in thickness currently measures 3.5 cm in thickness. Interval mild patchy density in the left lower lobe. The remainder of the lungs are clear. Interval small right pleural effusion. Stable mild scoliosis, upper abdominal surgical clips on the right, skin clips and right upper quadrant abdominal drain. IMPRESSION: 1. Stable approximately 35% right pneumothorax. 2. Interval small right pleural effusion. 3. Interval mild left lower lobe atelectasis, pneumonia or aspiration pneumonitis. Electronically Signed   By: Catherin Closs M.D.   On: 02/06/2024 10:11   CT ABDOMEN PELVIS W CONTRAST Addendum Date: 02/05/2024 ADDENDUM REPORT: 02/05/2024 21:31 ADDENDUM: Critical Value/emergent results were called by telephone at the time of interpretation on 02/05/2024 at 9:29 pm to provider Aldean Hummingbird, who verbally acknowledged these results. Electronically Signed   By: Wyvonnia Heimlich M.D.   On: 02/05/2024 21:31   Result Date: 02/05/2024 CLINICAL DATA:  Postoperative abdominal pain, concern for abscess. EXAM: CT ABDOMEN AND PELVIS WITH CONTRAST TECHNIQUE: Multidetector CT imaging of the abdomen and pelvis was performed using the standard protocol following bolus administration of intravenous contrast. RADIATION DOSE REDUCTION: This exam was performed according to the departmental dose-optimization program which includes automated exposure control, adjustment of the mA and/or kV according to patient size and/or use of iterative reconstruction technique. CONTRAST:  75mL OMNIPAQUE  IOHEXOL  350 MG/ML SOLN COMPARISON:  01/08/2024.  FINDINGS: Lower chest: There are small bilateral pleural effusions with atelectasis or infiltrate at the lung bases. A trace pericardial effusion is noted. Eight pneumothorax is noted on the right and there is  suspicion for possible pneumomediastinum. Hepatobiliary: Surgical changes are present in the posterior right lobe of the liver. The gallbladder is surgically absent. No biliary ductal dilatation is seen. Pancreas: Unremarkable. No pancreatic ductal dilatation or surrounding inflammatory changes. Spleen: Surgically absent. Adrenals/Urinary Tract: Adrenal glands are within normal limits. The kidneys enhance symmetrically. No renal calculus or hydronephrosis bilaterally. There is questionable mild wall are wall thickening which may be related to under distension. Stomach/Bowel: Surgical changes are present at the gastric antrum and proximal jejunum. No bowel obstruction, free air, or pneumatosis is seen. The appendix is not seen. Vascular/Lymphatic: No significant vascular findings are present. Prominent lymph nodes are noted along the external iliac chain on the right measuring up to 1 cm. Reproductive: Status post hysterectomy. Multiple masses are identified in the pelvis measuring up to 6.3 x 5.0 cm, increased in size from the prior exam. Other: A small amount of ascites is present in the abdomen and pelvis. Surgical changes are noted in the abdomen. A drain courses in the posterior aspect of the right lobe of the liver. No abscess is seen. Musculoskeletal: The bony structures are stable. No acute osseous abnormality is seen. IMPRESSION: 1. Postsurgical changes involving the posterior aspect the right lobe of the liver and splenectomy with surgical drain terminating in the right upper quadrant. Mild mesenteric edema and small ascites is noted. No free air or abscess is seen. 2. Moderate right pneumothorax and possible small pneumomediastinum. 3. Interval enlargement of pelvic masses, now measuring up to 6.3 cm. 4. Small bilateral pleural effusions with atelectasis. 5. Mild bladder wall thickening, which may be infectious or inflammatory. Electronically Signed: By: Wyvonnia Heimlich M.D. On: 02/05/2024 21:18     Anti-infectives: Anti-infectives (From admission, onward)    Start     Dose/Rate Route Frequency Ordered Stop   01/29/24 2200  ceFAZolin  (ANCEF ) IVPB 2g/100 mL premix        2 g 200 mL/hr over 30 Minutes Intravenous Every 8 hours 01/29/24 1840 01/29/24 2153   01/29/24 0700  ceFAZolin  (ANCEF ) IVPB 2g/100 mL premix        2 g 200 mL/hr over 30 Minutes Intravenous On call to O.R. 01/29/24 0653 01/29/24 1331       Assessment/Plan: Postop day 9 s/p Exploratory laparotomy, debulking of metastatic GI stromal tumors with splenectomy, antrectomy with billroth II anastamosis, partial right hepatectomy, resection of peritoneal metastatic lesions including 3 cm pelvic mass, fulguration of diaphragmatic lesions with argon, placement of bilateral tube thoracostomy tubes, intraoperative ultrasound 01/29/2024 [name] for recurrent GI stromal tumor  Cards following tachycardia.  Increased metoprolol  5/22. Echo looks ok.  Soft diet, carafate , protonix.   Foley-out, no retention  Pain control - epidural out.  Prn oxy, robaxin . Dilaudid  for breakthrough Effexor , PRN xanax  anxiety/hot flashes   Tamoxifen  is on hold for VTE risk and has been off for 2 weeks  Chest tubes out, right ptx looks sl smaller today, but too large to send home. Will repeat CXR tomorrow AM, continue IS and can add supplemental O2 via [place]    Ambulate today and incentive spirometry today.  FEN-Na, K, Phos, Mag OK.    VTE prophylaxis - scds, lovenox .  ASA added for thrombocytosis.  ABL anemia on anemia of chronic dx.  Hgb 9.4  Dispo:  main issue  keeping her here is right PTX>  this is not from lung injury but from diaphragmatic entry from tumor involvement.  Looks slightly smaller today and pt is stable.  Will repeat CXR tomorrow. Hopefully this will continue to shrink down and will be able to d/c.  If not, patient may need replacement of pigtail.      LOS: 9 days   Annetta Killian, [facility]  Surgery 02/07/2024, 11:07 AM Please see Amion for pager number during day hours 7:00am-4:30pm

## 2024-02-07 NOTE — Plan of Care (Signed)

## 2024-02-08 ENCOUNTER — Inpatient Hospital Stay (HOSPITAL_COMMUNITY)

## 2024-02-08 NOTE — Plan of Care (Signed)

## 2024-02-08 NOTE — Progress Notes (Signed)
   General Surgery Follow Up Note  Subjective:    Overnight Issues:   Objective:  Vital signs for last 24 hours: Temp:  [98.3 F (36.8 C)-98.9 F (37.2 C)] 98.6 F (37 C) (05/25 0814) Pulse Rate:  [89-103] 101 (05/25 0340) Resp:  [12-20] 20 (05/25 0340) BP: (104-130)/(51-63) 127/63 (05/25 0814) SpO2:  [98 %-100 %] 99 % (05/25 0814)  Hemodynamic parameters for last 24 hours:    Intake/Output from previous day: No intake/output data recorded.  Intake/Output this shift: No intake/output data recorded.  Vent settings for last 24 hours:    Physical Exam:  Gen: comfortable, no distress Neuro: follows commands, alert, communicative HEENT: PERRL Neck: supple CV: RRR Pulm: unlabored breathing on [place] Abd: soft, NT  , +BM GU: urine clear and yellow, +spontaneous void Extr: wwp, no edema  No results found for this or any previous visit (from the past 24 hours).  Assessment & Plan:  Present on Admission:  Gastrointestinal stromal tumor (GIST) (HCC)  GIST (gastrointestinal stromal tumor), malignant (HCC)    LOS: 10 days   Additional comments:I reviewed the patient's new clinical lab test results.   and I reviewed the patients new imaging test results.    Postop day 10  s/p Exploratory laparotomy, debulking of metastatic GI stromal tumors with splenectomy, antrectomy with billroth II anastamosis, partial right hepatectomy, resection of peritoneal metastatic lesions including 3 cm pelvic mass, fulguration of diaphragmatic lesions with argon, placement of bilateral tube thoracostomy tubes, intraoperative ultrasound 01/29/2024 [name] for recurrent GI stromal tumor   Cards following tachycardia.  Increased metoprolol  5/22. Echo looks ok.  Soft diet, carafate , protonix.    Foley-out, no retention   Pain control - epidural out.  Prn oxy, robaxin . Dilaudid  for breakthrough Effexor , PRN xanax  anxiety/hot flashes    Tamoxifen  is on hold for VTE risk and has been off for 2  weeks   Chest tubes out, right ptx looks sl larger today than yest, but improved from the prior day. Patient reports noncompliance with IS, vows to be compliant today. Remains too large to send home. Will repeat CXR tomorrow AM, continue IS and can add supplemental O2 via [place]. Ambulate in halls, has only bee going to the bathroom.    Ambulate today and incentive spirometry today.   FEN-Na, K, Phos, Mag OK.     VTE prophylaxis - scds, lovenox .  ASA added for thrombocytosis.   ABL anemia on anemia of chronic dx.  Hgb 9.4   Dispo:  main issue keeping her here is right PTX. This is not from lung injury but from diaphragmatic entry from tumor involvement.  Pt remains stable, though lung is still down significantly down. Will repeat CXR tomorrow. Encouraged ambulation, IS, and improved nutrition with drinking at least two Boost per day.     Anne Bamberg, MD Trauma & General Surgery Please use [email or web address] to contact on call provider  02/08/2024  *Care during the described time interval was provided by me. I have reviewed this patient's available data, including medical history, events of note, physical examination and test results as part of my evaluation.

## 2024-02-08 NOTE — Plan of Care (Signed)
   Problem: Education: Goal: Knowledge of General Education information will improve Description Including pain rating scale, medication(s)/side effects and non-pharmacologic comfort measures Outcome: Progressing

## 2024-02-09 ENCOUNTER — Inpatient Hospital Stay (HOSPITAL_COMMUNITY)

## 2024-02-09 MED ORDER — ONDANSETRON HCL 4 MG/2ML IJ SOLN
4.0000 mg | Freq: Four times a day (QID) | INTRAMUSCULAR | Status: DC | PRN
  Administered 2024-02-09 – 2024-02-16 (×13): 4 mg via INTRAVENOUS
  Filled 2024-02-09 (×13): qty 2

## 2024-02-09 NOTE — Progress Notes (Signed)
 Physical Therapy Treatment and Discharge Patient Details Name: Anne Horton MRN: [id number] DOB: 04/12/1976 Today's Date: 02/09/2024   History of Present Illness Pt is a 48 y.o. female who presented 01/29/24 for recurrent GI stromal tumor. S/p exploratory laparotomy, debulking of metastatic GI stromal tumors with splenectomy, antrectomy with billroth II anastamosis, partial right hepatectomy, resection of peritoneal metastatic lesions including 3 cm pelvic mass, fulguration of diaphragmatic lesions with argon, placement of bilateral tube thoracostomy tubes, intraoperative ultrasound 5/15. PMH: anemia, anxiety, breast cancer, depression, dysrhythmia, fibromyalgia, mitral regurgitation, neurofibromatosis    PT Comments  Pt met her physical therapy goals during her inpatient stay. Pt ambulating 400 ft with no assistive device independently and negotiated 5 steps with a railing to simulate home set up. Pt with brief desaturation to 89% on RA mid walk, but able to rebound to 95% with standing rest break and pursed lip breathing. HR peak 116 bpm. Pt reports compliance with IS use and has been ambulating with spouse. No further acute or follow up PT needs. Thank you for this consult.     If plan is discharge home, recommend the following: Assistance with cooking/housework;Assist for transportation   Can travel by private vehicle        Equipment Recommendations  None recommended by PT    Recommendations for Other Services       Precautions / Restrictions Precautions Precautions: Fall;Other (comment) Recall of Precautions/Restrictions: Intact Precaution/Restrictions Comments: abdominal precautions Restrictions Weight Bearing Restrictions Per Provider Order: No     Mobility  Bed Mobility               General bed mobility comments: OOB in chair    Transfers Overall transfer level: Independent Equipment used: None                    Ambulation/Gait Ambulation/Gait  assistance: Independent Gait Distance (Feet): 400 Feet Assistive device: None Gait Pattern/deviations: Step-through pattern, Decreased stride length           Stairs Stairs: Yes Stairs assistance: Modified independent (Device/Increase time) Stair Management: One rail Right Number of Stairs: 5 General stair comments: step by step   Wheelchair Mobility     Tilt Bed    Modified Rankin (Stroke Patients Only)       Balance Overall balance assessment: Needs assistance Sitting-balance support: Feet supported Sitting balance-Leahy Scale: Good     Standing balance support: No upper extremity supported, During functional activity Standing balance-Leahy Scale: Good                              Communication Communication Communication: No apparent difficulties  Cognition Arousal: Alert Behavior During Therapy: WFL for tasks assessed/performed   PT - Cognitive impairments: No apparent impairments                         Following commands: Intact      Cueing Cueing Techniques: Verbal cues  Exercises      General Comments        Pertinent Vitals/Pain Pain Assessment Pain Assessment: 0-10 Pain Score: 2  Pain Location: R side of abdomen Pain Descriptors / Indicators: Discomfort, Grimacing, Guarding, Operative site guarding Pain Intervention(s): Monitored during session    Home Living                          Prior Function  PT Goals (current goals can now be found in the care plan section) Acute Rehab PT Goals Potential to Achieve Goals: Good Progress towards PT goals: Goals met/education completed, patient discharged from PT    Frequency    Min 2X/week      PT Plan      Co-evaluation              AM-PAC PT "6 Clicks" Mobility   Outcome Measure  Help needed turning from your back to your side while in a flat bed without using bedrails?: None Help needed moving from lying on your back to  sitting on the side of a flat bed without using bedrails?: None Help needed moving to and from a bed to a chair (including a wheelchair)?: None Help needed standing up from a chair using your arms (e.g., wheelchair or bedside chair)?: None Help needed to walk in hospital room?: None Help needed climbing 3-5 steps with a railing? : None 6 Click Score: 24    End of Session   Activity Tolerance: Patient tolerated treatment well Patient left: in chair;with call bell/phone within reach Nurse Communication: Mobility status PT Visit Diagnosis: Unsteadiness on feet (R26.81);Other abnormalities of gait and mobility (R26.89);Difficulty in walking, not elsewhere classified (R26.2);Pain     Time: 0981-1914 PT Time Calculation (min) (ACUTE ONLY): 15 min  Charges:    $Therapeutic Activity: 8-22 mins PT General Charges $$ ACUTE PT VISIT: 1 Visit                     Verdia Glad, PT, DPT Acute Rehabilitation Services Office [phone]    Claria Crofts 02/09/2024, 12:47 PM

## 2024-02-09 NOTE — Progress Notes (Signed)
 11 Days Post-Op   Subjective/Chief Complaint: Still has some right sided chest tightness, worse in the morning. Walked a lot yesterday. Pulling 500-750 on IS. Husband at bedside.   Objective: Vital signs in last 24 hours: Temp:  [98.2 F (36.8 C)-98.5 F (36.9 C)] 98.4 F (36.9 C) (05/25 2340) Pulse Rate:  [78-86] 85 (05/25 2012) Resp:  [13-19] 16 (05/25 2340) BP: (118-139)/(60-62) 139/60 (05/25 2340) SpO2:  [95 %-98 %] 95 % (05/25 2340) Last BM Date : 02/07/24  Intake/Output from previous day: 05/25 0701 - 05/26 0700 In: 600 [P.O.:600] Out: 120 [Emesis/NG output:120] Intake/Output this shift: No intake/output data recorded.  General:  alert and oriented. NAD   Resp:  breathing comfortably CV: RRR in the 90s Abd:  soft, non distended, dressings c/d/I.   Ext:  warm, well perfused, no edema.    Lab Results:  No results for input(s): "WBC", "HGB", "HCT", "PLT" in the last 72 hours.  BMET No results for input(s): "NA", "K", "CL", "CO2", "GLUCOSE", "BUN", "CREATININE", "CALCIUM " in the last 72 hours.  PT/INR No results for input(s): "LABPROT", "INR" in the last 72 hours.  ABG No results for input(s): "PHART", "HCO3" in the last 72 hours.  Invalid input(s): "PCO2", "PO2"   Studies/Results: DG CHEST PORT 1 VIEW Result Date: 02/08/2024 CLINICAL DATA:  Evaluate right-sided pneumothorax. EXAM: PORTABLE CHEST 1 VIEW COMPARISON:  02/07/2024, 02/06/2024 FINDINGS: Evidence patient's known right-sided pneumothorax measures 2.6 cm in thickness and demonstrates slight interval worsening from 02/07/2024, although slightly improved from 02/06/2024. Remainder of the lungs are clear. Cardiomediastinal silhouette and remainder of the exam is unchanged. IMPRESSION: Slight interval worsening of patient's known right-sided pneumothorax measuring 2.6 cm in thickness. Electronically Signed   By: Roda Cirri M.D.   On: 02/08/2024 08:52    Anti-infectives: Anti-infectives (From admission,  onward)    Start     Dose/Rate Route Frequency Ordered Stop   01/29/24 2200  ceFAZolin  (ANCEF ) IVPB 2g/100 mL premix        2 g 200 mL/hr over 30 Minutes Intravenous Every 8 hours 01/29/24 1840 01/29/24 2153   01/29/24 0700  ceFAZolin  (ANCEF ) IVPB 2g/100 mL premix        2 g 200 mL/hr over 30 Minutes Intravenous On call to O.R. 01/29/24 0653 01/29/24 1331       Assessment/Plan: Postop day 9 s/p Exploratory laparotomy, debulking of metastatic GI stromal tumors with splenectomy, antrectomy with billroth II anastamosis, partial right hepatectomy, resection of peritoneal metastatic lesions including 3 cm pelvic mass, fulguration of diaphragmatic lesions with argon, placement of bilateral tube thoracostomy tubes, intraoperative ultrasound 01/29/2024 [name] for recurrent GI stromal tumor  Cards following tachycardia.  Increased metoprolol  5/22. Echo looks ok.  Carafate , protonix.   Foley-out, no retention  Pain control - epidural out.  Prn oxy, robaxin . Dilaudid  for breakthrough Effexor , PRN xanax  anxiety/hot flashes   Tamoxifen  is on hold for VTE risk and has been off for 2 weeks  Chest tubes out, right ptx looks sl smaller today, but too large to send home. Will repeat CXR tomorrow AM, continue IS and supplemental O2 via [place]    Ambulate today and incentive spirometry today.  FEN- reg diet.    VTE prophylaxis - scds, lovenox .  ASA added for thrombocytosis.  ABL anemia on anemia of chronic dx.  Hgb 9.4  Dispo:  main issue keeping her here is right PTX>  this is not from lung injury but from diaphragmatic entry from tumor involvement.  Looks slightly smaller  today and pt is stable.  Will repeat CXR tomorrow. Hopefully this will continue to shrink down and will be able to d/c.  If not, patient may need replacement of pigtail.      LOS: 11 days   Annetta Killian, [facility] Surgery 02/09/2024, 8:29 AM Please see Amion for pager number during day hours  7:00am-4:30pm

## 2024-02-09 NOTE — Plan of Care (Signed)
 Alert and oriented.  Medicated for nausea.  Additional PRN obtained for nausea.   Problem: Education: Goal: Knowledge of General Education information will improve Description: Including pain rating scale, medication(s)/side effects and non-pharmacologic comfort measures Outcome: Progressing   Problem: Health Behavior/Discharge Planning: Goal: Ability to manage health-related needs will improve Outcome: Progressing   Problem: Clinical Measurements: Goal: Ability to maintain clinical measurements within normal limits will improve Outcome: Progressing Goal: Will remain free from infection Outcome: Progressing

## 2024-02-09 NOTE — Plan of Care (Signed)

## 2024-02-10 ENCOUNTER — Inpatient Hospital Stay (HOSPITAL_COMMUNITY)

## 2024-02-10 ENCOUNTER — Other Ambulatory Visit (HOSPITAL_COMMUNITY): Payer: Self-pay

## 2024-02-10 MED ORDER — METHOCARBAMOL 1000 MG/10ML IJ SOLN
1000.0000 mg | Freq: Once | INTRAMUSCULAR | Status: AC
  Administered 2024-02-10: 1000 mg via INTRAVENOUS
  Filled 2024-02-10: qty 10

## 2024-02-10 MED ORDER — LIDOCAINE HCL (PF) 1 % IJ SOLN
30.0000 mL | Freq: Once | INTRAMUSCULAR | Status: AC
  Administered 2024-02-10: 30 mL
  Filled 2024-02-10: qty 30

## 2024-02-10 MED ORDER — SODIUM CHLORIDE 0.9 % IV SOLN
12.5000 mg | Freq: Four times a day (QID) | INTRAVENOUS | Status: DC | PRN
  Administered 2024-02-11 (×3): 12.5 mg via INTRAVENOUS
  Filled 2024-02-10: qty 12.5
  Filled 2024-02-10: qty 0.5
  Filled 2024-02-10 (×2): qty 12.5

## 2024-02-10 NOTE — Progress Notes (Signed)
 12 Days Post-Op   Subjective/Chief Complaint: No acute changes. On room air, denies shortness of breath. Pain well-controlled. Having some nausea but no vomiting. Passing flatus and had a small BM. CXR this morning shows unchanged pneumothorax compared to yesterday.  Objective: Vital signs in last 24 hours: Temp:  [98.2 F (36.8 C)-98.8 F (37.1 C)] 98.7 F (37.1 C) (05/27 0306) Pulse Rate:  [83-99] 92 (05/27 0306) Resp:  [16-19] 18 (05/27 0306) BP: (114-132)/(47-62) 123/47 (05/27 0306) SpO2:  [94 %-96 %] 96 % (05/27 0306) Last BM Date : 02/07/24  Intake/Output from previous day: 05/26 0701 - 05/27 0700 In: 600 [P.O.:600] Out: -  Intake/Output this shift: No intake/output data recorded.  General:  alert and oriented. NAD   Resp:  breathing comfortably on room air CV: RRR Abd:  soft, non distended, RUQ incision clean and dry with staples in place.  Ext:  warm, well perfused, no edema.    Lab Results:  No results for input(s): "WBC", "HGB", "HCT", "PLT" in the last 72 hours.  BMET No results for input(s): "NA", "K", "CL", "CO2", "GLUCOSE", "BUN", "CREATININE", "CALCIUM " in the last 72 hours.  PT/INR No results for input(s): "LABPROT", "INR" in the last 72 hours.  ABG No results for input(s): "PHART", "HCO3" in the last 72 hours.  Invalid input(s): "PCO2", "PO2"   Studies/Results: DG CHEST PORT 1 VIEW Result Date: 02/10/2024 CLINICAL DATA:  Pneumothorax. EXAM: PORTABLE CHEST 1 VIEW COMPARISON:  02/09/2024 FINDINGS: No substantial change in the right apical pneumothorax seen previously. Minimal left base atelectasis also similar to prior. No substantial pleural effusion. No pulmonary edema. Cardiopericardial silhouette is at upper limits of normal for size. Telemetry leads overlie the chest. IMPRESSION: 1. No substantial change in right apical pneumothorax. 2. Minimal left base atelectasis. Electronically Signed   By: Donnal Fusi M.D.   On: 02/10/2024 07:38   DG CHEST  PORT 1 VIEW Result Date: 02/09/2024 CLINICAL DATA:  [id number] Pneumothorax, right [id number] EXAM: PORTABLE CHEST 1 VIEW COMPARISON:  May 22-25, 2025 FINDINGS: The cardiomediastinal silhouette is unchanged in contour. No pleural effusion. Small to moderate RIGHT pneumothorax, similar dating back to the twenty-fourth. No acute pleuroparenchymal abnormality. IMPRESSION: Small to moderate RIGHT pneumothorax, similar dating back to the 24th of May. Electronically Signed   By: Clancy Crimes M.D.   On: 02/09/2024 11:11      Assessment/Plan: Postop day 12 s/p Exploratory laparotomy, debulking of metastatic GI stromal tumors with splenectomy, antrectomy with billroth II anastamosis, partial right hepatectomy, resection of peritoneal metastatic lesions including 3 cm pelvic mass, fulguration of diaphragmatic lesions with argon, placement of bilateral thoracostomy tubes, intraoperative ultrasound 01/29/2024 [name] for recurrent GI stromal tumor  - A-fib: Cards following, increased metoprolol  5/22. Echo looks ok. HR well-controlled.  - Pain control: Prn oxy, robaxin . Dilaudid  for breakthrough - Effexor , PRN xanax  anxiety/hot flashes  - Tamoxifen  is on hold for VTE risk and has been off for 2 weeks - Ambulate, pulmonary toilet - FEN- reg diet.   - VTE prophylaxis - scds, lovenox . On ASA for thrombocytosis.  Pneumothorax is moderate and persistent in size, without meaningful reduction in size since chest tube removal 5 days ago. Although patient is stable, the pneumothorax is too large for safe discharge. As it has not decreased in size, I recommended replacement of a chest tube today. Will place a pigtail catheter at bedside and keep on suction for today.    LOS: 12 days   Lujean Sake, MD [facility] Surgery  02/10/2024, 8:32 AM Please see Amion for pager number during day hours 7:00am-4:30pm

## 2024-02-10 NOTE — Procedures (Signed)
 Chest tube insertion  Date/Time: 02/10/2024 12:28 PM  Performed by: Lujean Sake, MD Authorized by: Lujean Sake, MD   Consent:    Consent obtained:  Written and verbal   Consent given by:  Patient   Alternatives discussed:  Observation Universal protocol:    Procedure explained and questions answered to patient or proxy's satisfaction: yes     Test results available and properly labeled: yes     Imaging studies available: yes     Site/side marked: yes     Immediately prior to procedure a time out was called: yes     Patient identity confirmed:  Hospital-assigned identification number and verbally with patient Pre-procedure details:    Skin preparation:  ChloraPrep   Preparation: Patient was prepped and draped in the usual sterile fashion   Anesthesia (see MAR for exact dosages):    Anesthesia method:  Local infiltration   Local anesthetic:  Lidocaine  1% w/o epi Procedure details:    Placement location:  R anterior   Scalpel size:  11   Tube size (Fr):  8   Technique: Seldinger     Ultrasound guidance: no     Tension pneumothorax: no     Tube connected to:  Suction   Drainage characteristics:  Yellow (Air and serous fluid)   Suture material:  2-0 silk   Dressing:  4x4 sterile gauze  8-Fr pigtail chest tube placed in right anterior position at bedside, with return of air and serous fluid. Patient tolerated well. Awaiting CXR to confirm tube position and resolution of pneumothorax.

## 2024-02-10 NOTE — Progress Notes (Addendum)
 Assisted Dr. Leighton Punches with right chest tube placement.  Time out completed prior to insertion.  Vitals monitored per protocol.  Patient stable after chest tube placement. Instructed to remain in bed while awaiting STAT chest x-ray for placement verification.                02/10/24 1208  Time-Out  Pt Identifiers(min of two) First/Last Name;MRN/Account#  Correct Site? Yes  Site Marked? Yes  Correct Side? Yes  Correct Patient Position? Yes  Correct Procedure? Yes  What Procedure? right chest tube placement  Antibiotics Ordered/Given? N/A  Consents Verified? Yes  Rad Studies Available? Yes  Lab Results Available? Yes  Verification-Surgical Team Yes  Safety Precautions Reviewed? Yes  Maximum Barrier Precautions  Hand hygiene completed? Yes  Patient Drape Partial body drape  Mask/Eye Shield Yes  Sterile Gloves Yes  Sterile Gown Yes  Cap Yes  Skin Preparation Chlorhexidine  gluconate  Was skin preparation agent completely dry at  time of first skin puncture? Yes  Special Equipment/requirements No              02/10/24 1200 02/10/24 1205 02/10/24 1210  Vitals  Temp 98.4 F (36.9 C)  --   --   Temp Source Oral  --   --   BP (!) 124/46 129/65 131/60  MAP (mmHg) 67 83 79  BP Location Left Arm  --   --   BP Method Automatic  --   --   Patient Position (if appropriate) Lying  --   --   Pulse Rate 87 92 92  Pulse Rate Source Monitor  --   --   ECG Heart Rate 87 93 92  Resp 14 13 16   Level of Consciousness  Level of Consciousness Alert  --   --   MEWS COLOR  MEWS Score Color [employer] Green  Oxygen Therapy  SpO2 99 % 99 % 99 %  O2 Device Nasal Cannula  --   --   O2 Flow Rate (L/min) 2 L/min  --   --     02/10/24 1215 02/10/24 1220 02/10/24 1225  Vitals  Temp  --   --   --   Temp Source  --   --   --   BP (!) 119/51 126/64 (!) 108/54  MAP (mmHg) 69 81 70  BP Location  --   --   --   BP Method  --   --   --   Patient Position (if appropriate)  --   --   --    Pulse Rate 88 89 89  Pulse Rate Source  --   --   --   ECG Heart Rate 89 89 89  Resp 20 17 (!) 21  Level of Consciousness  Level of Consciousness  --   --   --   MEWS COLOR  MEWS Score Color [employer] Green  Oxygen Therapy  SpO2 99 % 97 % 98 %  O2 Device  --   --   --   O2 Flow Rate (L/min)  --   --   --     02/10/24 1230  Vitals  Temp  --   Temp Source  --   BP 124/60  MAP (mmHg) 77  BP Location  --   BP Method  --   Patient Position (if appropriate)  --   Pulse Rate 86  Pulse Rate Source  --   ECG Heart Rate 86  Resp 17  Level  of Consciousness  Level of Consciousness  --   MEWS COLOR  MEWS Score Color Green  Oxygen Therapy  SpO2 98 %  O2 Device  --   O2 Flow Rate (L/min)  --

## 2024-02-10 NOTE — Progress Notes (Signed)
 Despite anti-nausea medication, patient continues to have episodes of nausea and vomiting.  Unprovoked, patient vomited 200 ml of bile appearing emesis.     02/10/24 0945  Emesis Measurement/Characteristics  Emesis (mL) 200 mL  Emesis Appearance Bile;Undigested food

## 2024-02-10 NOTE — Progress Notes (Signed)
   02/10/24 0845  Intake (mL)  P.O. 100 mL  Percent Meals Eaten (%) 0 %  Feeding Refused PO (Became nauseated with oral intake, medicated for nausea at this time.)

## 2024-02-10 NOTE — Plan of Care (Signed)
 Chest tube placement at bedside today with Dr. Leighton Punches.  OOB with assistance to bedside commode. Self-limiting diet to broth and jellos due to increased and persistent nausea/vomiting.  Medicated for pain as needed, see MAR for details.   Problem: Education: Goal: Knowledge of General Education information will improve Description: Including pain rating scale, medication(s)/side effects and non-pharmacologic comfort measures Outcome: Progressing   Problem: Health Behavior/Discharge Planning: Goal: Ability to manage health-related needs will improve Outcome: Progressing   Problem: Clinical Measurements: Goal: Ability to maintain clinical measurements within normal limits will improve Outcome: Progressing Goal: Will remain free from infection Outcome: Progressing

## 2024-02-10 NOTE — Progress Notes (Signed)
 Scheduled AM medications held per patient request due to persistent nausea.

## 2024-02-11 ENCOUNTER — Inpatient Hospital Stay (HOSPITAL_COMMUNITY)

## 2024-02-11 MED ORDER — SODIUM CHLORIDE 0.9% FLUSH: 10.0000 mL | INTRAVENOUS | Status: DC | PRN

## 2024-02-11 NOTE — Progress Notes (Signed)
 13 Days Post-Op   Subjective/Chief Complaint: CXR stable this morning, no significant pneumothorax. Patient says nausea is better this morning. She was able to eat some chicken broth yesterday.  Objective: Vital signs in last 24 hours: Temp:  [98.3 F (36.8 C)-98.5 F (36.9 C)] 98.5 F (36.9 C) (05/28 0314) Pulse Rate:  [86-110] 110 (05/28 0314) Resp:  [13-21] 16 (05/28 0314) BP: (108-136)/(46-65) 136/61 (05/28 0314) SpO2:  [95 %-99 %] 96 % (05/28 0314) Last BM Date : 02/10/24 (per pt she had one this morning)  Intake/Output from previous day: 05/27 0701 - 05/28 0700 In: 220 [P.O.:220] Out: 332 [Emesis/NG output:200; Chest Tube:132] Intake/Output this shift: No intake/output data recorded.  General:  alert and oriented. NAD   Resp:  nonlabored respirations. R CT in place to suction with serous fluid in pleurevac, no air leak. CV: tachycardia low 100s, regular Abd:  soft, non distended, RUQ incision clean and dry with staples in place.  Ext:  warm, well perfused, no edema.    Lab Results:  No results for input(s): "WBC", "HGB", "HCT", "PLT" in the last 72 hours.  BMET No results for input(s): "NA", "K", "CL", "CO2", "GLUCOSE", "BUN", "CREATININE", "CALCIUM " in the last 72 hours.  PT/INR No results for input(s): "LABPROT", "INR" in the last 72 hours.  ABG No results for input(s): "PHART", "HCO3" in the last 72 hours.  Invalid input(s): "PCO2", "PO2"   Studies/Results: DG CHEST PORT 1 VIEW Result Date: 02/10/2024 CLINICAL DATA:  [id number] Chest tube in place [id number] EXAM: PORTABLE CHEST - 1 VIEW COMPARISON:  Earlier film of the same day FINDINGS: Interval placement of pigtail catheter, in the lateral right hemithorax. Virtually complete evacuation of the previously noted pneumothorax. Lungs are clear. Heart size and mediastinal contours are within normal limits. No effusion. Surgical clips left axilla and right upper abdomen. IMPRESSION: Evacuation of right pneumothorax  following chest tube placement. Electronically Signed   By: Nicoletta Barrier M.D.   On: 02/10/2024 16:33   DG CHEST PORT 1 VIEW Result Date: 02/10/2024 CLINICAL DATA:  Pneumothorax. EXAM: PORTABLE CHEST 1 VIEW COMPARISON:  02/09/2024 FINDINGS: No substantial change in the right apical pneumothorax seen previously. Minimal left base atelectasis also similar to prior. No substantial pleural effusion. No pulmonary edema. Cardiopericardial silhouette is at upper limits of normal for size. Telemetry leads overlie the chest. IMPRESSION: 1. No substantial change in right apical pneumothorax. 2. Minimal left base atelectasis. Electronically Signed   By: Donnal Fusi M.D.   On: 02/10/2024 07:38      Assessment/Plan: Postop day 13 s/p Exploratory laparotomy, debulking of metastatic GI stromal tumors with splenectomy, antrectomy with billroth II anastamosis, partial right hepatectomy, resection of peritoneal metastatic lesions including 3 cm pelvic mass, fulguration of diaphragmatic lesions with argon, placement of bilateral thoracostomy tubes, intraoperative ultrasound 01/29/2024 [name] for recurrent GI stromal tumor  - A-fib: Cards following, increased metoprolol  5/22. Echo looks ok. HR well-controlled.  - Pain control: Prn oxycodone , robaxin , scheduled tylenol . Dilaudid  for breakthrough - Effexor , PRN xanax  anxiety/hot flashes  - Tamoxifen  is on hold for VTE risk and has been off for 2 weeks - Ambulate, pulmonary toilet. Patient encouraged to use IS more today. - Chest tube to water  seal today. Repeat CXR tomorrow am, and if no pneumothorax, plan to remove chest tube tomorrow. - FEN- reg diet. Nausea improved today. - VTE prophylaxis - scds, lovenox . On ASA for thrombocytosis. - Dispo: progressive care. Anticipate discharge next 1-2 days if nausea improved and able to  remove chest tube. Remove staples tomorrow (POD14).     LOS: 13 days   Lujean Sake, MD [facility] Surgery 02/11/2024, 8:10  AM Please see Amion for pager number during day hours 7:00am-4:30pm

## 2024-02-12 ENCOUNTER — Other Ambulatory Visit: Payer: Self-pay

## 2024-02-12 ENCOUNTER — Inpatient Hospital Stay (HOSPITAL_COMMUNITY)

## 2024-02-12 DIAGNOSIS — E43 Unspecified severe protein-calorie malnutrition: Secondary | ICD-10-CM | POA: Insufficient documentation

## 2024-02-12 LAB — CBC
HCT: 28.8 % — ABNORMAL LOW (ref 36.0–46.0)
Hemoglobin: 9.3 g/dL — ABNORMAL LOW (ref 12.0–15.0)
MCH: 29.2 pg (ref 26.0–34.0)
MCHC: 32.3 g/dL (ref 30.0–36.0)
MCV: 90.3 fL (ref 80.0–100.0)
Platelets: 1383 10*3/uL (ref 150–400)
RBC: 3.19 MIL/uL — ABNORMAL LOW (ref 3.87–5.11)
RDW: 14.8 % (ref 11.5–15.5)
WBC: 21.2 10*3/uL — ABNORMAL HIGH (ref 4.0–10.5)
nRBC: 0.1 % (ref 0.0–0.2)

## 2024-02-12 LAB — BASIC METABOLIC PANEL WITH GFR
Anion gap: 11 (ref 5–15)
BUN: 8 mg/dL (ref 6–20)
CO2: 23 mmol/L (ref 22–32)
Calcium: 8.3 mg/dL — ABNORMAL LOW (ref 8.9–10.3)
Chloride: 104 mmol/L (ref 98–111)
Creatinine, Ser: 0.55 mg/dL (ref 0.44–1.00)
GFR, Estimated: >60 mL/min (ref >60)
Glucose, Bld: 101 mg/dL — ABNORMAL HIGH (ref 70–99)
Potassium: 3.1 mmol/L — ABNORMAL LOW (ref 3.5–5.1)
Sodium: 138 mmol/L (ref 135–145)

## 2024-02-12 LAB — MAGNESIUM: Magnesium: 1.8 mg/dL (ref 1.7–2.4)

## 2024-02-12 MED ORDER — POLYETHYLENE GLYCOL 3350 17 G PO PACK: 17.0000 g | PACK | Freq: Every day | ORAL | Status: DC | PRN

## 2024-02-12 MED ORDER — TRACE MINERALS CU-MN-SE-ZN 300-55-60-3000 MCG/ML IV SOLN
INTRAVENOUS | Status: AC
  Filled 2024-02-12 (×2): qty 1000

## 2024-02-12 MED ORDER — METOCLOPRAMIDE HCL 5 MG/ML IJ SOLN
10.0000 mg | Freq: Three times a day (TID) | INTRAMUSCULAR | Status: DC
  Administered 2024-02-12 – 2024-02-19 (×22): 10 mg via INTRAVENOUS
  Filled 2024-02-12 (×22): qty 2

## 2024-02-12 MED ORDER — POTASSIUM CHLORIDE 10 MEQ/100ML IV SOLN
10.0000 meq | INTRAVENOUS | Status: AC
  Administered 2024-02-12 (×4): 10 meq via INTRAVENOUS
  Filled 2024-02-12 (×4): qty 100

## 2024-02-12 MED ORDER — MAGNESIUM SULFATE 2 GM/50ML IV SOLN
2.0000 g | Freq: Once | INTRAVENOUS | Status: AC
  Administered 2024-02-12: 2 g via INTRAVENOUS
  Filled 2024-02-12: qty 50

## 2024-02-12 MED ORDER — SODIUM CHLORIDE 0.9% FLUSH: 10.0000 mL | INTRAVENOUS | Status: DC | PRN

## 2024-02-12 MED ORDER — CHLORHEXIDINE GLUCONATE CLOTH 2 % EX PADS
6.0000 | MEDICATED_PAD | Freq: Every day | CUTANEOUS | Status: DC
  Administered 2024-02-13 – 2024-02-19 (×7): 6 via TOPICAL

## 2024-02-12 MED ORDER — METHOCARBAMOL 1000 MG/10ML IJ SOLN
500.0000 mg | Freq: Three times a day (TID) | INTRAMUSCULAR | Status: DC | PRN
  Administered 2024-02-16: 500 mg via INTRAVENOUS
  Filled 2024-02-12: qty 10

## 2024-02-12 NOTE — Progress Notes (Signed)
 Occupational Therapy Treatment Patient Details Name: Anne Horton MRN: [id number] DOB: 1976-02-11 Today's Date: 02/12/2024   History of present illness Pt is a 48 y.o. female who presented 01/29/24 for recurrent GI stromal tumor. S/p exploratory laparotomy, debulking of metastatic GI stromal tumors with splenectomy, antrectomy with billroth II anastamosis, partial right hepatectomy, resection of peritoneal metastatic lesions including 3 cm pelvic mass, fulguration of diaphragmatic lesions with argon, placement of bilateral tube thoracostomy tubes, intraoperative ultrasound 5/15. PMH: anemia, anxiety, breast cancer, depression, dysrhythmia, fibromyalgia, mitral regurgitation, neurofibromatosis   OT comments  Patient making good gains with OT treatment with supervision to mod I for self care, mobility, and transfers. Patient in recliner upon entry and perform mobility and transfer with IV pole but able to stand without UE support. Patient reports performing bathing at sink without assistance. Discharge plans continue to be appropriate. Acute OT to continue to follow.       If plan is discharge home, recommend the following:  Assistance with cooking/housework;Assist for transportation;Help with stairs or ramp for entrance;A little help with walking and/or transfers;A little help with bathing/dressing/bathroom   Equipment Recommendations  BSC/3in1;Other (comment)    Recommendations for Other Services      Precautions / Restrictions Precautions Precautions: Fall;Other (comment) Recall of Precautions/Restrictions: Intact Precaution/Restrictions Comments: abdominal precautions Restrictions Weight Bearing Restrictions Per Provider Order: No       Mobility Bed Mobility Overal bed mobility: Modified Independent             General bed mobility comments: OOB in chair    Transfers Overall transfer level: Independent                 General transfer comment: performed  mobility and transfer with use of IV pole     Balance Overall balance assessment: Needs assistance Sitting-balance support: Feet supported Sitting balance-Leahy Scale: Good     Standing balance support: No upper extremity supported, During functional activity Standing balance-Leahy Scale: Good Standing balance comment: no UE support with static standing                           ADL either performed or assessed with clinical judgement   ADL Overall ADL's : Needs assistance/impaired     Grooming: Modified independent;Wash/dry face;Standing               Lower Body Dressing: Modified independent Lower Body Dressing Details (indicate cue type and reason): able to change socks without assitance seated in recliner               General ADL Comments: Patient states she has been performing bathing at sink without assistance    Extremity/Trunk Assessment              Vision       Perception     Praxis     Communication Communication Communication: No apparent difficulties   Cognition Arousal: Alert Behavior During Therapy: WFL for tasks assessed/performed Cognition: No apparent impairments                               Following commands: Intact        Cueing   Cueing Techniques: Verbal cues  Exercises      Shoulder Instructions       General Comments      Pertinent Vitals/ Pain       Pain Assessment Pain  Assessment: Faces Faces Pain Scale: Hurts a little bit Pain Location: R side of abdomen Pain Descriptors / Indicators: Discomfort, Grimacing, Guarding, Operative site guarding Pain Intervention(s): Monitored during session  Home Living                                          Prior Functioning/Environment              Frequency  Min 2X/week        Progress Toward Goals  OT Goals(current goals can now be found in the care plan section)  Progress towards OT goals: Progressing toward  goals  Acute Rehab OT Goals Patient Stated Goal: to go home OT Goal Formulation: With patient Time For Goal Achievement: 02/14/24 Potential to Achieve Goals: Good ADL Goals Pt Will Perform Grooming: with supervision;standing Pt Will Perform Upper Body Bathing: sitting;with adaptive equipment;with supervision Pt Will Perform Lower Body Bathing: with set-up;with adaptive equipment;sitting/lateral leans Pt Will Perform Upper Body Dressing: with modified independence;sitting Pt Will Perform Lower Body Dressing: with contact guard assist;sit to/from stand;with adaptive equipment Pt Will Transfer to Toilet: with modified independence;ambulating Pt Will Perform Toileting - Clothing Manipulation and hygiene: with set-up;sitting/lateral leans;sit to/from stand  Plan      Co-evaluation                 AM-PAC OT "6 Clicks" Daily Activity     Outcome Measure   Help from another person eating meals?: None Help from another person taking care of personal grooming?: A Little Help from another person toileting, which includes using toliet, bedpan, or urinal?: A Little Help from another person bathing (including washing, rinsing, drying)?: A Little Help from another person to put on and taking off regular upper body clothing?: None Help from another person to put on and taking off regular lower body clothing?: None 6 Click Score: 21    End of Session Equipment Utilized During Treatment: Gait belt;Other (comment) (IV pole)  OT Visit Diagnosis: Unsteadiness on feet (R26.81);Other abnormalities of gait and mobility (R26.89);Muscle weakness (generalized) (M62.81);Pain Pain - part of body:  (abdomen)   Activity Tolerance Patient tolerated treatment well   Patient Left in chair;with call bell/phone within reach   Nurse Communication Mobility status;Precautions        Time: 1610-9604 OT Time Calculation (min): 13 min  Charges: OT General Charges $OT Visit: 1 Visit OT  Treatments $Self Care/Home Management : 8-22 mins  Anitra Barn, OTA Acute Rehabilitation Services  Office [phone]   Jovita Nipper 02/12/2024, 2:16 PM

## 2024-02-12 NOTE — Progress Notes (Signed)
 Peripherally Inserted Central Catheter Placement  The IV Nurse has discussed with the patient and/or persons authorized to consent for the patient, the purpose of this procedure and the potential benefits and risks involved with this procedure.  The benefits include less needle sticks, lab draws from the catheter, and the patient may be discharged home with the catheter. Risks include, but not limited to, infection, bleeding, blood clot (thrombus formation), and puncture of an artery; nerve damage and irregular heartbeat and possibility to perform a PICC exchange if needed/ordered by physician.  Alternatives to this procedure were also discussed.  Bard Power PICC patient education guide, fact sheet on infection prevention and patient information card has been provided to patient /or left at bedside.    PICC Placement Documentation  PICC Double Lumen 02/12/24 Right Brachial 35 cm 0 cm (Active)  Indication for Insertion or Continuance of Line Administration of hyperosmolar/irritating solutions (i.e. TPN, Vancomycin, etc.) 02/12/24 1115  Exposed Catheter (cm) 0 cm 02/12/24 1115  Site Assessment Clean, Dry, Intact 02/12/24 1115  Lumen #1 Status Flushed;Saline locked;Blood return noted 02/12/24 1115  Lumen #2 Status Flushed;Saline locked;Blood return noted 02/12/24 1115  Dressing Type Transparent;Securing device 02/12/24 1115  Dressing Status Antimicrobial disc/dressing in place;Clean, Dry, Intact 02/12/24 1115  Line Care Connections checked and tightened 02/12/24 1115  Line Adjustment (NICU/IV Team Only) No 02/12/24 1115  Dressing Intervention New dressing;Adhesive placed at insertion site (IV team only) 02/12/24 1115  Dressing Change Due 02/19/24 02/12/24 1115       Nadean August 02/12/2024, 11:30 AM

## 2024-02-12 NOTE — Progress Notes (Signed)
 14 Days Post-Op   Subjective/Chief Complaint: No significant pneumothorax on CXR this morning. Patient had multiple episodes of emesis yesterday and again this morning. Was only able to eat some jello yesterday. Has not had a full solid meal since surgery. Has nausea after taking morning meds and often after eating. KUB yesterday afternoon shows normal bowel gas pattern, no gastric distension. Patient is passing flatus.  Objective: Vital signs in last 24 hours: Temp:  [98.4 F (36.9 C)-99.1 F (37.3 C)] 98.9 F (37.2 C) (05/29 0822) Pulse Rate:  [92-108] 108 (05/29 0822) Resp:  [16-20] 18 (05/29 0822) BP: (100-127)/(54-69) 121/54 (05/29 0822) SpO2:  [93 %-98 %] 96 % (05/29 0822) Last BM Date : 02/10/24 (per pt she had one this morning)  Intake/Output from previous day: 05/28 0701 - 05/29 0700 In: 50 [IV Piggyback:50] Out: 672 [Emesis/NG output:650; Chest Tube:22] Intake/Output this shift: No intake/output data recorded.  General:  alert and oriented. NAD   Resp:  nonlabored respirations on room air. R CT in place to water  seal. CV: RRR Abd:  soft, non distended, RUQ incision clean and dry with staples in place.  Ext:  warm, well perfused, no edema.    Lab Results:  Recent Labs    02/12/24 0609  WBC 21.2*  HGB 9.3*  HCT 28.8*  PLT 1,383*    BMET Recent Labs    02/12/24 0609  NA 138  K 3.1*  CL 104  CO2 23  GLUCOSE 101*  BUN 8  CREATININE 0.55  CALCIUM  8.3*    PT/INR No results for input(s): "LABPROT", "INR" in the last 72 hours.  ABG No results for input(s): "PHART", "HCO3" in the last 72 hours.  Invalid input(s): "PCO2", "PO2"   Studies/Results: DG CHEST PORT 1 VIEW Result Date: 02/12/2024 CLINICAL DATA:  Right-sided chest tube. EXAM: PORTABLE CHEST 1 VIEW COMPARISON:  02/03/2024 FINDINGS: Low volume film. Cardiopericardial silhouette is at upper limits of normal for size. Right chest tube again noted with possible but not definite trace apical  pneumothorax. Minimal atelectasis left base with potential tiny left pleural effusion. The cardiopericardial silhouette is within normal limits for size. Telemetry leads overlie the chest. IMPRESSION: 1. Right chest tube with possible but not definite trace apical pneumothorax. 2. Minimal left base atelectasis with potential tiny left pleural effusion. Electronically Signed   By: Donnal Fusi M.D.   On: 02/12/2024 07:53   DG Abd 1 View Result Date: 02/11/2024 CLINICAL DATA:  Bilious vomiting. Postop day 13 status post exploratory laparotomy, debulking of metastatic GI stromal tumor with splenectomy, antrectomy with Billroth 2 anastomosis, partial right hepatectomy, diaphragmatic lesion fulguration, and resection of peritoneal metastatic lesions EXAM: ABDOMEN - 1 VIEW COMPARISON:  CT abdomen 02/05/2024 FINDINGS: Skin staples in right upper quadrant clips and staples noted along with bowel staple lines in the left abdomen. There is gas in the nondistended colon along with formed stool in the rectum. Paucity of gas in the small bowel. The right abdominal JP drain is no longer present. IMPRESSION: 1. Unremarkable colon. Paucity of bowel gas in small bowel, usually this is incidental although the lack of a bowel gas pattern to analyze could prevent detection of small bowel pathology. 2. Postoperative findings. Electronically Signed   By: Freida Jes M.D.   On: 02/11/2024 19:17   DG CHEST PORT 1 VIEW Result Date: 02/11/2024 CLINICAL DATA:  Right sided pneumothorax status post chest tube placement EXAM: PORTABLE CHEST 1 VIEW COMPARISON:  02/10/2024 FINDINGS: Single frontal view of  the chest demonstrates stable position of the right-sided pleural drainage catheter. Trace residual right apical pneumothorax, estimated far less than 5% with pleural separation now measuring 3 mm. No airspace disease or effusion. No acute bony abnormalities. IMPRESSION: 1. Trace residual right apical pneumothorax, volume estimated  far less than 5%. This has diminished since prior study. 2. Stable right chest tube. Electronically Signed   By: Bobbye Burrow M.D.   On: 02/11/2024 08:52   DG CHEST PORT 1 VIEW Result Date: 02/10/2024 CLINICAL DATA:  [id number] Chest tube in place [id number] EXAM: PORTABLE CHEST - 1 VIEW COMPARISON:  Earlier film of the same day FINDINGS: Interval placement of pigtail catheter, in the lateral right hemithorax. Virtually complete evacuation of the previously noted pneumothorax. Lungs are clear. Heart size and mediastinal contours are within normal limits. No effusion. Surgical clips left axilla and right upper abdomen. IMPRESSION: Evacuation of right pneumothorax following chest tube placement. Electronically Signed   By: Nicoletta Barrier M.D.   On: 02/10/2024 16:33      Assessment/Plan: Postop day 14 s/p Exploratory laparotomy, debulking of metastatic GI stromal tumors with splenectomy, antrectomy with billroth II anastamosis, partial right hepatectomy, resection of peritoneal metastatic lesions including 3 cm pelvic mass, fulguration of diaphragmatic lesions with argon, placement of bilateral thoracostomy tubes, intraoperative ultrasound 01/29/2024 [name] for recurrent GI stromal tumor  - A-fib: Evaluated by cards, increased metoprolol  5/22. Echo looks ok. HR well-controlled.  - Pain control: Prn oxycodone  and robaxin . Dilaudid  for breakthrough - Effexor , PRN xanax  anxiety/hot flashes  - Tamoxifen  is on hold for VTE risk - Ambulate, pulmonary toilet, IS - Chest tube removed this morning. Follow up CXR in 3-4 hours to confirm no reaccumulation of pneumothorax. - FEN: Patient has had persistent nausea and frequent vomiting for the last few days, and on my discussions with her this morning she has had no meaningful PO intake since surgery, only occasional small amounts of solid food. Will consult nutrition today. Plan to place PICC and start TPN. Suspect delayed gastric emptying as the cause of her symptoms.  Will also begin scheduled Reglan. Replete potassium. - VTE prophylaxis - scds, lovenox . On ASA for thrombocytosis. - Dispo: med-surg status. Remove staples today. Unable to discharge due to frequent vomiting and poor PO intake.    LOS: 14 days   Lujean Sake, MD [facility] 02/12/2024, 9:08 AM Please see Amion for pager number during day hours 7:00am-4:30pm

## 2024-02-12 NOTE — Progress Notes (Addendum)
 Initial Nutrition Assessment  DOCUMENTATION CODES:   Severe malnutrition in context of acute illness/injury  INTERVENTION:   TPN per pharmacy to meet 100% of estimated needs Add 100 mg Thiamine and MVI to TPN  Monitor for diet advancement  Monitor magnesium , potassium, and phosphorus BID for at least 3 days, MD to replete as needed, as pt is at risk for refeeding syndrome   NUTRITION DIAGNOSIS:   Severe Malnutrition related to acute illness as evidenced by moderate fat depletion, moderate muscle depletion, energy intake < or equal to 50% for > or equal to 5 days. GI cancer  GOAL:   Patient will meet greater than or equal to 90% of their needs   MONITOR:   Diet advancement, I & O's, Labs (TPN tolerance)  REASON FOR ASSESSMENT:   Consult Poor PO, New TPN/TNA  ASSESSMENT:   48 y.o Female with metastatic GI stromal tumor, admitted for planned operation. S/P EX lap with splenectomy, antrectomy with billroth II anastamosis, partial right hepatectomy, resection of peritoneal metastatic lesions, fulguration of diaphragmatic lesions, placement of bilateral thoracostomy tubes. PMH of breast cancer, anxiety, SBR (2022).  5/15: S/P EX lap with splenectomy, antrectomy with billroth II anastamosis, partial right hepatectomy, resection of peritoneal metastatic lesions, fulguration of diaphragmatic lesions, placement of bilateral thoracostomy tubes 5/17: NGT pulled,  CLD 5/20: FLD 5/21: SOFT diet  5/22 - Chest tubes removed  5/23: CT showed moderate right sided pneumothorax, no distress, awaiting right PTX to resolve before d/c  5/15: Regular diet, thin liquids  5/27: Chest tube replaced, pt became nauseated and vomited after oral intake. 200 ml bile emesis.  5/28: CXR pneumothorax resolved, multiple episodes of emesis. KUB showed normal bowel gas pattern, no gas distension.  5/29: Multiple episodes of emesis this morning, NPO, TPN started, chest tube removed   Pt originally diagnosed  with GIST in 2022 s/p SBR. Started on Gleevec . Presented to ED in 2023 for severe abdominal pain, symptoms resolved, discharged. Follow up scan in 2023 showed no evidence of recurrence. 02/2023 restaging study did show 8 cm recurrent mass. 03/2024 robotic resection of malignant pelvic mass w/appendectomy. Also found numerous implants in pelvis and sidewall at this time.   Pt reports eating "like a bird" PTA. However she would eat frequent smaller meals/snacks to keep her intake up. Denies poor appetite or weight loss PTA. Reports her UBW to range from 135-140 lbs in the last year and right before surgery. Has new bed wt of 125 lbs. Walking PTA.  Pt POD 14, has been having nausea and emesis in the past couple of days and has had little no no intake since surgery. Pt reports she has not had a full meal since before surgery instead she has been either eating bites of her meals or just drinking broth or jello. Was maybe drinking 1-2 Boost Breeze per day but has stopped drinking them completely in the past couple of days. Pt not motivated to eat right now due to nausea. Discussed nutritional plan with pt including TPN.   MD suspects pt with delayed gastric emptying. TPN today and start Reglan. Pt passing flatus,last BM 5/27 per pt. Pt will be at refeeding risk. Discussed with pharmacy.   Per pharmacy: Will start clinimix-E today @ 41ml per hour  Goal TPN rate is 75 mL/hr (provides 90 g of protein and 1728 kcals per day)  Dietary recall: Breakfast:oatmeal with protein bar or yogurt with fruit Lunch: leftovers or salad and fruit Dinner: Meat with a grain and  a vegetable Snacks: crackers, trailmix Drinks:Protein shakes, regular soda, water   Admit weight: 59.9 kg  Current weight: 57 kg    Average Meal Intake: 5/22: 25% x 1 meal 5/25: 25% x 1 meal 5/26: 20-25% x 3 meals 5/27: 0% x 2 meals 5/27: 50% x 1 meal    Intake/Output Summary (Last 24 hours) at 02/12/2024 1423 Last data filed at 02/12/2024  1610 Gross per 24 hour  Intake --  Output 372 ml  Net -372 ml  Emesis 650 ml yesterday, 1 episode this morning    Nutritionally Relevant Medications: Scheduled Meds:  aspirin EC  81 mg Oral Daily   Chlorhexidine  Gluconate Cloth  6 each Topical Daily   enoxaparin  (LOVENOX ) injection  40 mg Subcutaneous Q24H   metoCLOPramide (REGLAN) injection  10 mg Intravenous Q8H   metoprolol  succinate  50 mg Oral BID   pantoprazole  40 mg Oral Q1200   venlafaxine  XR  75 mg Oral Daily   Continuous Infusions:  .TPN (CLINIMIX-E) Adult      Labs Reviewed: Potassium 3.1 Calcium  8.3 Albumin  2.9 No recent CBG No A1C   NUTRITION - FOCUSED PHYSICAL EXAM:  Flowsheet Row Most Recent Value  Upper Arm Region Mild depletion  Thoracic and Lumbar Region Moderate depletion  Buccal Region Mild depletion  Temple Region Moderate depletion  Clavicle Bone Region Moderate depletion  Clavicle and Acromion Bone Region Moderate depletion  Scapular Bone Region Moderate depletion  Dorsal Hand Mild depletion  Patellar Region Moderate depletion  Anterior Thigh Region Moderate depletion  Posterior Calf Region Moderate depletion  Edema (RD Assessment) None  Hair Reviewed  Eyes Reviewed  Mouth Reviewed  Skin Reviewed  Nails Reviewed       Diet Order:   Diet Order             Diet NPO time specified Except for: Ice Chips, Sips with Meds, Other (See Comments)  Diet effective now                   EDUCATION NEEDS:   Education needs have been addressed  Skin:  Skin Assessment: Skin Integrity Issues: Skin Integrity Issues:: Incisions Incisions: Abdomen  Last BM:  5/27 - per Pt  Height:   Ht Readings from Last 1 Encounters:  01/29/24 5\' 2"  (1.575 m)    Weight:   Wt Readings from Last 1 Encounters:  02/12/24 57 kg    Ideal Body Weight:  50 kg  BMI:  Body mass index is 22.98 kg/m.  Estimated Nutritional Needs:   Kcal:  1600-1800 kcal  Protein:  80-100 gm  Fluid:   >1.6L/day   Frederik Jansky, RD Registered Dietitian  See Amion for more information

## 2024-02-12 NOTE — Progress Notes (Signed)
 Date and time results received: 02/12/24 0645 (use smartphrase ".now" to insert current time)  Test: Platelets  Critical Value: 1383  Name of Provider Notified: Carson Clara MD   Orders Received? Or Actions Taken?:  Awaiting orders

## 2024-02-12 NOTE — Progress Notes (Signed)
 PHARMACY - TOTAL PARENTERAL NUTRITION CONSULT NOTE   Indication: Intolerance to enteral nutrition  Patient Measurements: Height: 5\' 2"  (157.5 cm) Weight: 57 kg (125 lb 10.6 oz) (Bed weight) IBW/kg (Calculated) : 50.1 TPN AdjBW (KG): 59.9 Body mass index is 22.98 kg/m. Usual Weight: 130  Assessment: 48 y.o. female with metastatic GI stromal tumor, s/p Exlap 5/15. Delayed gastric emptying and unable to tolerate enteral feeds. Pharmacy consulted to manage TPN.   Glucose / Insulin: CBG controlled, no SSI needed Electrolytes: K+ 3.1 (replaced 40 meq IV)  Renal: stable at baseline (0.4-0.6) Hepatic: alb 2.9, total protein 5.5, others WNL Intake / Output; MIVF: several episodes of emesis reported yesterday (- ) GI Imaging: 5/22:Multiple masses are identified in the pelvis measuring up to 6.3 x 5.0 cm GI Surgeries / Procedures:  5/15: Exploratory laparotomy: debulking of metastatic GI stromal tumors with splenectomy, antrectomy with billroth II anastamosis, partial right hepatectomy, resection of peritoneal metastatic lesions including 3 cm pelvic mass, fulguration of diaphragmatic lesions with argon, placement of bilateral tube thoracostomy tubes, intraoperative ultrasound   Central access: PICC consulted ordered  TPN start date: 02/12/24  Nutritional Goals: Goal TPN rate is 75 mL/hr (provides 90 g of protein and 1728 kcals per day) Will start clinimix-E today @ 41ml per hour   RD Assessment: Estimated Needs Total Energy Estimated Needs: 1600-1800 kcal  Current Nutrition:  NPO  Plan:  Start Clinimix-E TPN at 41 mL/hr at 1800 Electrolytes in TPN: Na 35mEq/L, K 30mEq/L, Ca 63mEq/L, Mg 45mEq/L, and Phos 15mmol/L. Cl:Ac 1:1 Monitor TPN labs on Mon/Thurs, and tomorrow 5/30  Mohammed Andrew, PharmD Clinical Pharmacist 02/12/2024 10:55 AM Please check AMION for all [facility] numbers

## 2024-02-12 NOTE — Progress Notes (Signed)
 35 staples removed per provider order. Originally instructed to leave incision open to air but there were 2 spots after staples were removed where the skin had some separation. Pictures taken & notified Dr. Leighton Punches. Steri-strips placed along openings and vertical abdominal incision per verbal request from Dr. Leighton Punches.

## 2024-02-13 LAB — COMPREHENSIVE METABOLIC PANEL WITH GFR
ALT: 24 U/L (ref 0–44)
AST: 33 U/L (ref 15–41)
Albumin: 2.6 g/dL — ABNORMAL LOW (ref 3.5–5.0)
Alkaline Phosphatase: 53 U/L (ref 38–126)
Anion gap: 9 (ref 5–15)
BUN: 11 mg/dL (ref 6–20)
CO2: 26 mmol/L (ref 22–32)
Calcium: 8.1 mg/dL — ABNORMAL LOW (ref 8.9–10.3)
Chloride: 103 mmol/L (ref 98–111)
Creatinine, Ser: 0.38 mg/dL — ABNORMAL LOW (ref 0.44–1.00)
GFR, Estimated: >60 mL/min (ref >60)
Glucose, Bld: 119 mg/dL — ABNORMAL HIGH (ref 70–99)
Potassium: 3.5 mmol/L (ref 3.5–5.1)
Sodium: 138 mmol/L (ref 135–145)
Total Bilirubin: 0.5 mg/dL (ref 0.0–1.2)
Total Protein: 5.6 g/dL — ABNORMAL LOW (ref 6.5–8.1)

## 2024-02-13 LAB — MAGNESIUM: Magnesium: 2 mg/dL (ref 1.7–2.4)

## 2024-02-13 LAB — GLUCOSE, CAPILLARY
Glucose-Capillary: 137 mg/dL — ABNORMAL HIGH (ref 70–99)
Glucose-Capillary: 147 mg/dL — ABNORMAL HIGH (ref 70–99)

## 2024-02-13 LAB — PHOSPHORUS: Phosphorus: 2.9 mg/dL (ref 2.5–4.6)

## 2024-02-13 LAB — TRIGLYCERIDES: Triglycerides: 113 mg/dL (ref <150)

## 2024-02-13 MED ORDER — PROCHLORPERAZINE EDISYLATE 10 MG/2ML IJ SOLN
10.0000 mg | Freq: Four times a day (QID) | INTRAMUSCULAR | Status: DC | PRN
  Administered 2024-02-13 – 2024-02-17 (×3): 10 mg via INTRAVENOUS
  Filled 2024-02-13 (×3): qty 2

## 2024-02-13 MED ORDER — INSULIN ASPART 100 UNIT/ML IJ SOLN
0.0000 [IU] | Freq: Three times a day (TID) | INTRAMUSCULAR | Status: DC
  Administered 2024-02-13 – 2024-02-15 (×6): 1 [IU] via SUBCUTANEOUS

## 2024-02-13 MED ORDER — POTASSIUM CHLORIDE 10 MEQ/100ML IV SOLN
10.0000 meq | INTRAVENOUS | Status: AC
  Administered 2024-02-13 (×4): 10 meq via INTRAVENOUS
  Filled 2024-02-13 (×4): qty 100

## 2024-02-13 MED ORDER — TRAVASOL 10 % IV SOLN
INTRAVENOUS | Status: AC
  Filled 2024-02-13: qty 900

## 2024-02-13 MED ORDER — BOOST / RESOURCE BREEZE PO LIQD CUSTOM
1.0000 | Freq: Three times a day (TID) | ORAL | Status: DC
  Administered 2024-02-13 – 2024-02-18 (×8): 1 via ORAL
  Filled 2024-02-13 (×2): qty 1

## 2024-02-13 NOTE — Progress Notes (Signed)
 PHARMACY - TOTAL PARENTERAL NUTRITION CONSULT NOTE   Indication: Intolerance to enteral nutrition  Patient Measurements: Height: 5\' 2"  (157.5 cm) Weight: 57 kg (125 lb 10.6 oz) (Bed weight) IBW/kg (Calculated) : 50.1 TPN AdjBW (KG): 59.9 Body mass index is 22.98 kg/m. Usual Weight: 130  Assessment:  48 y.o. female with metastatic GI stromal tumor, s/p exploratory laparotomy, debulking of metastatic GI stromal tumors with splenectomy, antrectomy, partial right hepatectomy . Delayed gastric emptying and unable to tolerate enteral feeds. Patient has had persistent nausea and frequent vomiting for the last few days, and per surgery's discussions with the patient has not had significant PO intake since surgery, only occasional small amounts of solid food. Suspect delayed gastric emptying. Pharmacy consulted to initiate TPN.   Glucose / Insulin: CBG < 120 not on insulin, no SSI needed Electrolytes: K 3.5 (s/p Kcl ), CO2 26, Phos 2.9, Mg 2.0 Renal: stable at baseline (0.4-0.6), BUN 11 Hepatic: LFTs wnl, alb 2.6, TG 113 at start of TPN Intake / Output; MIVF: several episodes of emesis reported 5/28 (650 mL output) scheduled metoclopramide initiated 5/29 >>  GI Imaging: 5/22:Multiple masses are identified in the pelvis measuring up to 6.3 x 5.0 cm GI Surgeries / Procedures:  5/15: Exploratory laparotomy: debulking of metastatic GI stromal tumors with splenectomy, antrectomy with billroth II anastamosis, partial right hepatectomy, resection of peritoneal metastatic lesions including 3 cm pelvic mass, fulguration of diaphragmatic lesions with argon, placement of bilateral tube thoracostomy tubes, intraoperative ultrasound   Central access: PICC consulted ordered  TPN start date: 02/12/24  Nutritional Goals: Goal TPN rate is 75 mL/hr (provides 90 g of protein and 1728 kcals per day) 5/29 Clinimix-E @ 41 mL/hr hour   RD Assessment: Estimated Needs Total Energy Estimated Needs: 1600-1800  kcal Total Protein Estimated Needs: 80-100 gm Total Fluid Estimated Needs: >1.6L/day  Current Nutrition:  5/29 TPN + NPO 5/30 TPN + CLD  Plan:  Start TPN at 75 mL/hr at 1800, to provide 1720 kcal and 90 AA meeting 100% nutrition needs Electrolytes in TPN: Na 74mEq/L, K 67mEq/L, Ca 42mEq/L, Mg 65mEq/L, and Phos 15mmol/L. Cl:Ac 1:1 Kcl 40 mEq IV x1 Add standard MVI and trace elements to TPN Add thiamine 100mg  IV to TPN x5 days Initiate Sensitive q8h SSI and adjust as needed  Reduce MIVF to TKO mL/hr at 1800 Monitor TPN labs on Mon/Thurs, daily as needed  Thank you for allowing pharmacy to be a part of this patient's care.  Claudia Cuff, PharmD, BCPS Clinical Pharmacist

## 2024-02-13 NOTE — Progress Notes (Signed)
 15 Days Post-Op   Subjective/Chief Complaint: Patient still having intermittent nausea but no vomiting. Appetite is poor. Has been ambulating.   Objective: Vital signs in last 24 hours: Temp:  [97.6 F (36.4 C)-98.9 F (37.2 C)] 98 F (36.7 C) (05/30 0300) Pulse Rate:  [85-108] 90 (05/30 0300) Resp:  [18] 18 (05/30 0300) BP: (105-128)/(51-63) 127/63 (05/30 0300) SpO2:  [96 %-98 %] 98 % (05/30 0300) Weight:  [57 kg] 57 kg (05/29 1022) Last BM Date : 02/10/24  Intake/Output from previous day: No intake/output data recorded. Intake/Output this shift: No intake/output data recorded.  General:  alert and oriented. NAD   Resp:  nonlabored respirations on room air.  Abd:  soft, non distended, RUQ incision clean and dry, two small areas of very mild skin separation on midline portion of incision - these are very shallow. No erythema, induration or drainage. Ext:  warm, well perfused, no edema.    Lab Results:  Recent Labs    02/12/24 0609  WBC 21.2*  HGB 9.3*  HCT 28.8*  PLT 1,383*    BMET Recent Labs    02/12/24 0609  NA 138  K 3.1*  CL 104  CO2 23  GLUCOSE 101*  BUN 8  CREATININE 0.55  CALCIUM  8.3*    PT/INR No results for input(s): "LABPROT", "INR" in the last 72 hours.  ABG No results for input(s): "PHART", "HCO3" in the last 72 hours.  Invalid input(s): "PCO2", "PO2"   Studies/Results: DG CHEST PORT 1 VIEW Result Date: 02/12/2024 CLINICAL DATA:  Chest tube removal EXAM: PORTABLE CHEST 1 VIEW COMPARISON:  02/12/2024, 02/11/2024, 02/10/2024 FINDINGS: Right upper extremity central venous catheter tip at the cavoatrial junction. Removal of previously noted right-sided chest tube. Interval small right apical pneumothorax, estimated at less than 10%. Stable cardiomediastinal silhouette. IMPRESSION: Removal of right-sided chest tube with interval small right apical pneumothorax, estimated at less than 10%. These results will be called to the ordering clinician or  representative by the Radiologist Assistant, and communication documented in the PACS or [employer]. Electronically Signed   By: Esmeralda Hedge M.D.   On: 02/12/2024 15:48   US  EKG SITE RITE Result Date: 02/12/2024 If Site Rite image not attached, placement could not be confirmed due to current cardiac rhythm.  DG CHEST PORT 1 VIEW Result Date: 02/12/2024 CLINICAL DATA:  Right-sided chest tube. EXAM: PORTABLE CHEST 1 VIEW COMPARISON:  02/03/2024 FINDINGS: Low volume film. Cardiopericardial silhouette is at upper limits of normal for size. Right chest tube again noted with possible but not definite trace apical pneumothorax. Minimal atelectasis left base with potential tiny left pleural effusion. The cardiopericardial silhouette is within normal limits for size. Telemetry leads overlie the chest. IMPRESSION: 1. Right chest tube with possible but not definite trace apical pneumothorax. 2. Minimal left base atelectasis with potential tiny left pleural effusion. Electronically Signed   By: Donnal Fusi M.D.   On: 02/12/2024 07:53   DG Abd 1 View Result Date: 02/11/2024 CLINICAL DATA:  Bilious vomiting. Postop day 13 status post exploratory laparotomy, debulking of metastatic GI stromal tumor with splenectomy, antrectomy with Billroth 2 anastomosis, partial right hepatectomy, diaphragmatic lesion fulguration, and resection of peritoneal metastatic lesions EXAM: ABDOMEN - 1 VIEW COMPARISON:  CT abdomen 02/05/2024 FINDINGS: Skin staples in right upper quadrant clips and staples noted along with bowel staple lines in the left abdomen. There is gas in the nondistended colon along with formed stool in the rectum. Paucity of gas in the small bowel.  The right abdominal JP drain is no longer present. IMPRESSION: 1. Unremarkable colon. Paucity of bowel gas in small bowel, usually this is incidental although the lack of a bowel gas pattern to analyze could prevent detection of small bowel pathology. 2. Postoperative  findings. Electronically Signed   By: Freida Jes M.D.   On: 02/11/2024 19:17      Assessment/Plan: Postop day 15 s/p Exploratory laparotomy, debulking of metastatic GI stromal tumors with splenectomy, antrectomy with billroth II anastamosis, partial right hepatectomy, resection of peritoneal metastatic lesions including 3 cm pelvic mass, fulguration of diaphragmatic lesions with argon, placement of bilateral thoracostomy tubes, intraoperative ultrasound 01/29/2024 [name] for recurrent GI stromal tumor  - A-fib: Evaluated by cards, increased metoprolol  5/22. Echo looks ok. HR well-controlled.  - Pain control: Prn oxycodone  and robaxin . Dilaudid  for breakthrough - Effexor , PRN xanax  anxiety/hot flashes  - Tamoxifen  is on hold for VTE risk - Ambulate, pulmonary toilet, IS - R chest tube removed 5/29, follow up CXR with very minimal residual apical pneumothorax. - Nausea/vomiting: No gastric or small bowel distension on previous KUB, and patient is having bowel function. Suspect delayed gastric emptying. NG placement deferred, vomiting has improved with NPO status. Continue reglan. - Wound care: incisional staples removed on POD14 and steristrips placed. - FEN: Patient has had poor PO intake secondary to nausea and vomiting this week. TPN started 5/29, continue until patient is tolerating PO intake. Advance to clear liquids today. Labs pending, replete electrolytes as needed. - VTE prophylaxis - scds, lovenox . On ASA for thrombocytosis. - Dispo: med-surg status.     LOS: 15 days   Lujean Sake, MD [facility] 02/13/2024, 6:32 AM Please see Amion for pager number during day hours 7:00am-4:30pm

## 2024-02-14 LAB — BASIC METABOLIC PANEL WITH GFR
Anion gap: 8 (ref 5–15)
BUN: 11 mg/dL (ref 6–20)
CO2: 26 mmol/L (ref 22–32)
Calcium: 8.3 mg/dL — ABNORMAL LOW (ref 8.9–10.3)
Chloride: 102 mmol/L (ref 98–111)
Creatinine, Ser: 0.45 mg/dL (ref 0.44–1.00)
GFR, Estimated: >60 mL/min (ref >60)
Glucose, Bld: 111 mg/dL — ABNORMAL HIGH (ref 70–99)
Potassium: 4.1 mmol/L (ref 3.5–5.1)
Sodium: 136 mmol/L (ref 135–145)

## 2024-02-14 LAB — GLUCOSE, CAPILLARY
Glucose-Capillary: 147 mg/dL — ABNORMAL HIGH (ref 70–99)
Glucose-Capillary: 141 mg/dL — ABNORMAL HIGH (ref 70–99)
Glucose-Capillary: 121 mg/dL — ABNORMAL HIGH (ref 70–99)

## 2024-02-14 LAB — MAGNESIUM: Magnesium: 1.9 mg/dL (ref 1.7–2.4)

## 2024-02-14 LAB — PHOSPHORUS: Phosphorus: 3 mg/dL (ref 2.5–4.6)

## 2024-02-14 MED ORDER — TRAVASOL 10 % IV SOLN
INTRAVENOUS | Status: AC
  Filled 2024-02-14: qty 900

## 2024-02-14 NOTE — Progress Notes (Signed)
 PHARMACY - TOTAL PARENTERAL NUTRITION CONSULT NOTE   Indication: Intolerance to enteral nutrition  Patient Measurements: Height: 5\' 2"  (157.5 cm) Weight: 57 kg (125 lb 10.6 oz) (Bed weight) IBW/kg (Calculated) : 50.1 TPN AdjBW (KG): 59.9 Body mass index is 22.98 kg/m. Usual Weight: 130  Assessment:  48 y.o. female with metastatic GI stromal tumor, s/p exploratory laparotomy, debulking of metastatic GI stromal tumors with splenectomy, antrectomy, partial right hepatectomy . Delayed gastric emptying and unable to tolerate enteral feeds. Patient has had persistent nausea and frequent vomiting for the last few days, and per surgery's discussions with the patient has not had significant PO intake since surgery, only occasional small amounts of solid food. Suspect delayed gastric emptying. Pharmacy consulted to initiate TPN.   Glucose / Insulin : CBG < 180, 2 units sSSI/24 hr Electrolytes: K 3.5 (s/p Kcl ), CO2 26, Phos 2.9, Mg 2.0 Renal: stable at baseline (0.4-0.6), BUN 11 Hepatic: LFTs wnl, alb 2.6, TG 113 at start of TPN Intake / Output; MIVF: no output charted 5/30 scheduled metoclopramide  initiated 5/29 >>  GI Imaging: 5/22:Multiple masses are identified in the pelvis measuring up to 6.3 x 5.0 cm GI Surgeries / Procedures:  5/15: Exploratory laparotomy: debulking of metastatic GI stromal tumors with splenectomy, antrectomy with billroth II anastamosis, partial right hepatectomy, resection of peritoneal metastatic lesions including 3 cm pelvic mass, fulguration of diaphragmatic lesions with argon, placement of bilateral tube thoracostomy tubes, intraoperative ultrasound   Central access: PICC consulted ordered  TPN start date: 02/12/24  Nutritional Goals: Goal TPN rate is 75 mL/hr (provides 90 g of protein and 1728 kcals per day)  RD Assessment: Estimated Needs Total Energy Estimated Needs: 1600-1800 kcal Total Protein Estimated Needs: 80-100 gm Total Fluid Estimated Needs:  >1.6L/day  Current Nutrition:  5/29 TPN + NPO 5/30 TPN + CLD  Plan:  Continue TPN at 75 mL/hr at 1800, to provide 1720 kcal and 90 AA meeting 100% nutrition needs Electrolytes in TPN: Na 81mEq/L, K 45mEq/L, Ca 67mEq/L, increase to Mg 8 mEq/L, and Phos 23mmol/L. Cl:Ac 1:1 Add standard MVI and trace elements to TPN Add thiamine 100mg  IV to TPN x5 days Initiate Sensitive q8h SSI and adjust as needed  Reduce MIVF to TKO mL/hr at 1800 Monitor TPN labs on Mon/Thurs, daily as needed  Thank you for allowing pharmacy to be a part of this patient's care.  Claudia Cuff, PharmD, BCPS Clinical Pharmacist

## 2024-02-14 NOTE — Progress Notes (Signed)
 16 Days Post-Op  Subjective: CC: Abdominal pain stable yesterday. No abdominal pain currently.  Small episode of emesis last night.  Nausea still present but improved this am and drinking a boost without vomiting.  BM yesterday.  Voiding. Mobilizing.   Afebrile. No tachycardia or systolic hypotension. K 4.1. Mg 1.9. Phos 3  Objective: Vital signs in last 24 hours: Temp:  [98.3 F (36.8 C)-99.4 F (37.4 C)] 98.9 F (37.2 C) (05/31 0800) Pulse Rate:  [89-103] 90 (05/31 0800) Resp:  [16-20] 18 (05/31 0431) BP: (106-130)/(48-59) 106/59 (05/31 0800) SpO2:  [94 %-97 %] 97 % (05/31 0431) Last BM Date : 02/13/24  Intake/Output from previous day: No intake/output data recorded. Intake/Output this shift: No intake/output data recorded.  PE: Gen:  Alert, NAD, pleasant Card:  Reg rate Pulm:  CTAB, no W/R/R, effort normal Abd: Soft, ND, NT, +BS, incision with steri-strips in place, cdi Ext:  No LE edema or calf tenderness Psych: A&Ox3  Skin: no rashes noted, warm and dry  Lab Results:  Recent Labs    02/12/24 0609  WBC 21.2*  HGB 9.3*  HCT 28.8*  PLT 1,383*   BMET Recent Labs    02/13/24 0538 02/14/24 0500  NA 138 136  K 3.5 4.1  CL 103 102  CO2 26 26  GLUCOSE 119* 111*  BUN 11 11  CREATININE 0.38* 0.45  CALCIUM  8.1* 8.3*   PT/INR No results for input(s): "LABPROT", "INR" in the last 72 hours. CMP     Component Value Date/Time   NA 136 02/14/2024 0500   NA 143 10/14/2023 1428   K 4.1 02/14/2024 0500   CL 102 02/14/2024 0500   CO2 26 02/14/2024 0500   GLUCOSE 111 (H) 02/14/2024 0500   BUN 11 02/14/2024 0500   BUN 12 10/14/2023 1428   CREATININE 0.45 02/14/2024 0500   CREATININE 0.76 01/19/2024 0748   CALCIUM  8.3 (L) 02/14/2024 0500   PROT 5.6 (L) 02/13/2024 0538   ALBUMIN  2.6 (L) 02/13/2024 0538   AST 33 02/13/2024 0538   AST 26 01/19/2024 0748   ALT 24 02/13/2024 0538   ALT 20 01/19/2024 0748   ALKPHOS 53 02/13/2024 0538   BILITOT 0.5  02/13/2024 0538   BILITOT 0.4 01/19/2024 0748   GFRNONAA >60 02/14/2024 0500   GFRNONAA >60 01/19/2024 0748   GFRAA >60 08/03/2018 0801   Lipase     Component Value Date/Time   LIPASE 19 02/17/2021 1052    Studies/Results: DG CHEST PORT 1 VIEW Result Date: 02/12/2024 CLINICAL DATA:  Chest tube removal EXAM: PORTABLE CHEST 1 VIEW COMPARISON:  02/12/2024, 02/11/2024, 02/10/2024 FINDINGS: Right upper extremity central venous catheter tip at the cavoatrial junction. Removal of previously noted right-sided chest tube. Interval small right apical pneumothorax, estimated at less than 10%. Stable cardiomediastinal silhouette. IMPRESSION: Removal of right-sided chest tube with interval small right apical pneumothorax, estimated at less than 10%. These results will be called to the ordering clinician or representative by the Radiologist Assistant, and communication documented in the PACS or [employer]. Electronically Signed   By: Esmeralda Hedge M.D.   On: 02/12/2024 15:48    Anti-infectives: Anti-infectives (From admission, onward)    Start     Dose/Rate Route Frequency Ordered Stop   01/29/24 2200  ceFAZolin  (ANCEF ) IVPB 2g/100 mL premix        2 g 200 mL/hr over 30 Minutes Intravenous Every 8 hours 01/29/24 1840 01/29/24 2153   01/29/24 0700  ceFAZolin  (ANCEF ) IVPB  2g/100 mL premix        2 g 200 mL/hr over 30 Minutes Intravenous On call to O.R. 01/29/24 0653 01/29/24 1331        Assessment/Plan Postop day 16 s/p Exploratory laparotomy, debulking of metastatic GI stromal tumors with splenectomy, antrectomy with billroth II anastamosis, partial right hepatectomy, resection of peritoneal metastatic lesions including 3 cm pelvic mass, fulguration of diaphragmatic lesions with argon, placement of bilateral thoracostomy tubes, intraoperative ultrasound 01/29/2024 [name] for recurrent GI stromal tumor   - A-fib: Evaluated by cards, increased metoprolol  5/22. Echo looks ok. HR  well-controlled.  - Pain control: Prn oxycodone  and robaxin . Dilaudid  for breakthrough - Effexor , PRN xanax  anxiety/hot flashes  - Tamoxifen  is on hold for VTE risk - Ambulate, pulmonary toilet, IS - R chest tube removed 5/29, follow up CXR 5/29 with very minimal residual apical pneumothorax. - Nausea/vomiting: No gastric or small bowel distension on previous KUB, and patient is having bowel function. Suspect delayed gastric emptying. Continue reglan . Allow CLD. If any further n/v, can make NPO - Wound care: incisional staples removed on POD14 and steristrips placed. - FEN: Patient has had poor PO intake secondary to nausea and vomiting. TPN started 5/29, continue until patient is tolerating PO intake. CLD. Replete electrolytes as needed. - VTE prophylaxis - scds, lovenox . On ASA for thrombocytosis. - Dispo: med-surg status.    LOS: 16 days    Delton Filbert, [facility] 02/14/2024, 9:35 AM Please see Amion for pager number during day hours 7:00am-4:30pm

## 2024-02-15 ENCOUNTER — Inpatient Hospital Stay (HOSPITAL_COMMUNITY)

## 2024-02-15 LAB — BASIC METABOLIC PANEL WITH GFR
Anion gap: 8 (ref 5–15)
BUN: 12 mg/dL (ref 6–20)
CO2: 26 mmol/L (ref 22–32)
Calcium: 8.5 mg/dL — ABNORMAL LOW (ref 8.9–10.3)
Chloride: 100 mmol/L (ref 98–111)
Creatinine, Ser: 0.36 mg/dL — ABNORMAL LOW (ref 0.44–1.00)
GFR, Estimated: >60 mL/min (ref >60)
Glucose, Bld: 105 mg/dL — ABNORMAL HIGH (ref 70–99)
Potassium: 4.6 mmol/L (ref 3.5–5.1)
Sodium: 134 mmol/L — ABNORMAL LOW (ref 135–145)

## 2024-02-15 LAB — MAGNESIUM: Magnesium: 2.2 mg/dL (ref 1.7–2.4)

## 2024-02-15 LAB — PHOSPHORUS: Phosphorus: 3.6 mg/dL (ref 2.5–4.6)

## 2024-02-15 LAB — GLUCOSE, CAPILLARY
Glucose-Capillary: 138 mg/dL — ABNORMAL HIGH (ref 70–99)
Glucose-Capillary: 139 mg/dL — ABNORMAL HIGH (ref 70–99)

## 2024-02-15 MED ORDER — TRAVASOL 10 % IV SOLN
INTRAVENOUS | Status: AC
  Filled 2024-02-15: qty 900

## 2024-02-15 MED ORDER — TRAVASOL 10 % IV SOLN: INTRAVENOUS | Status: DC

## 2024-02-15 NOTE — Progress Notes (Signed)
 17 Days Post-Op  Subjective: CC: Reports she is doing well. Tolerating cld yesterday without abdominal pain, n/v. Did have small episode of vomiting after coughing fit this am but has otherwise been tolerating po without n/v.  BM yesterday.  Voiding. Mobilizing.   Afebrile. No tachycardia or systolic hypotension. K 4.6. Mg 2.2. Phos 3.6.  Objective: Vital signs in last 24 hours: Temp:  [98.3 F (36.8 C)-98.9 F (37.2 C)] 98.9 F (37.2 C) (06/01 0815) Pulse Rate:  [83-90] 90 (06/01 1001) Resp:  [16] 16 (06/01 0815) BP: (106-122)/(46-58) 122/46 (06/01 1001) SpO2:  [96 %-98 %] 97 % (06/01 0815) Last BM Date : 02/13/24  Intake/Output from previous day: No intake/output data recorded. Intake/Output this shift: No intake/output data recorded.  PE: Gen:  Alert, NAD, pleasant Card:  Reg rate Pulm:  CTAB, no W/R/R, effort normal Abd: Soft, ND, NT, +BS, incision with steri-strips in place, cdi Ext:  No LE edema or calf tenderness Psych: A&Ox3  Skin: no rashes noted, warm and dry  Lab Results:  No results for input(s): "WBC", "HGB", "HCT", "PLT" in the last 72 hours.  BMET Recent Labs    02/14/24 0500 02/15/24 0650  NA 136 134*  K 4.1 4.6  CL 102 100  CO2 26 26  GLUCOSE 111* 105*  BUN 11 12  CREATININE 0.45 0.36*  CALCIUM  8.3* 8.5*   PT/INR No results for input(s): "LABPROT", "INR" in the last 72 hours. CMP     Component Value Date/Time   NA 134 (L) 02/15/2024 0650   NA 143 10/14/2023 1428   K 4.6 02/15/2024 0650   CL 100 02/15/2024 0650   CO2 26 02/15/2024 0650   GLUCOSE 105 (H) 02/15/2024 0650   BUN 12 02/15/2024 0650   BUN 12 10/14/2023 1428   CREATININE 0.36 (L) 02/15/2024 0650   CREATININE 0.76 01/19/2024 0748   CALCIUM  8.5 (L) 02/15/2024 0650   PROT 5.6 (L) 02/13/2024 0538   ALBUMIN  2.6 (L) 02/13/2024 0538   AST 33 02/13/2024 0538   AST 26 01/19/2024 0748   ALT 24 02/13/2024 0538   ALT 20 01/19/2024 0748   ALKPHOS 53 02/13/2024 0538    BILITOT 0.5 02/13/2024 0538   BILITOT 0.4 01/19/2024 0748   GFRNONAA >60 02/15/2024 0650   GFRNONAA >60 01/19/2024 0748   GFRAA >60 08/03/2018 0801   Lipase     Component Value Date/Time   LIPASE 19 02/17/2021 1052    Studies/Results: No results found.   Anti-infectives: Anti-infectives (From admission, onward)    Start     Dose/Rate Route Frequency Ordered Stop   01/29/24 2200  ceFAZolin  (ANCEF ) IVPB 2g/100 mL premix        2 g 200 mL/hr over 30 Minutes Intravenous Every 8 hours 01/29/24 1840 01/29/24 2153   01/29/24 0700  ceFAZolin  (ANCEF ) IVPB 2g/100 mL premix        2 g 200 mL/hr over 30 Minutes Intravenous On call to O.R. 01/29/24 0653 01/29/24 1331        Assessment/Plan Postop day 17 s/p Exploratory laparotomy, debulking of metastatic GI stromal tumors with splenectomy, antrectomy with billroth II anastamosis, partial right hepatectomy, resection of peritoneal metastatic lesions including 3 cm pelvic mass, fulguration of diaphragmatic lesions with argon, placement of bilateral thoracostomy tubes, intraoperative ultrasound 01/29/2024 [name] for recurrent GI stromal tumor   - A-fib: Evaluated by cards, increased metoprolol  5/22. Echo looks ok. HR well-controlled.  - Pain control: Prn oxycodone  and robaxin . Dilaudid  for breakthrough -  Effexor , PRN xanax  anxiety/hot flashes  - Tamoxifen  is on hold for VTE risk - Ambulate, pulmonary toilet, IS - R chest tube removed 5/29, follow up CXR 5/29 with very minimal residual apical pneumothorax. - Nausea/vomiting: No gastric or small bowel distension on previous KUB, and patient is having bowel function. Suspect delayed gastric emptying. Continue reglan . Allow CLD. If any further n/v, can make NPO - Wound care: incisional staples removed on POD14 and steristrips placed. - FEN: Patient has had poor PO intake secondary to nausea and vomiting. TPN started 5/29, continue until patient is tolerating PO intake. FLD. Replete electrolytes  as needed. - VTE prophylaxis - scds, lovenox . On ASA for thrombocytosis. - Dispo: med-surg status.    LOS: 17 days    Delton Filbert, [facility] 02/15/2024, 10:55 AM Please see Amion for pager number during day hours 7:00am-4:30pm

## 2024-02-15 NOTE — Progress Notes (Signed)
 Patient vomited x 2. PA Bambi Lever made aware as to this morning episode after coughing. Order to monitor and call MD if any other episodes of vomiting. MD Ramiro Burly was made aware of second episode. New order to place patient at NPO status with sips. Patient aware of diet update. Order placed in EPIC. Jenetta Misty, RN

## 2024-02-15 NOTE — Progress Notes (Signed)
 PHARMACY - TOTAL PARENTERAL NUTRITION CONSULT NOTE   Indication: Intolerance to enteral nutrition  Patient Measurements: Height: 5\' 2"  (157.5 cm) Weight: 57 kg (125 lb 10.6 oz) (Bed weight) IBW/kg (Calculated) : 50.1 TPN AdjBW (KG): 59.9 Body mass index is 22.98 kg/m. Usual Weight: 130  Assessment:  48 y.o. female with metastatic GI stromal tumor, s/p exploratory laparotomy, debulking of metastatic GI stromal tumors with splenectomy, antrectomy, partial right hepatectomy . Delayed gastric emptying and unable to tolerate enteral feeds. Patient has had persistent nausea and frequent vomiting for the last few days, and per surgery's discussions with the patient has not had significant PO intake since surgery, only occasional small amounts of solid food. Suspect delayed gastric emptying. Pharmacy consulted to initiate TPN.   5/31 patient reported less nausea/vomiting. Able to tolerate Boost Breeze. Continuing on CLD.   Glucose / Insulin : CBG < 180, 3 units sSSI/24 hr Electrolytes: K 4.6, CO2 26, Phos 3.6, Mg 2.2 Renal: stable at baseline (0.4-0.6), BUN 12 Hepatic: LFTs wnl, alb 2.6, TG 113 at start of TPN Intake / Output; MIVF: no output charted 5/30, LBM 5/31 scheduled metoclopramide  initiated 5/29 >>  GI Imaging: 5/22:Multiple masses are identified in the pelvis measuring up to 6.3 x 5.0 cm GI Surgeries / Procedures:  5/15: Exploratory laparotomy: debulking of metastatic GI stromal tumors with splenectomy, antrectomy with billroth II anastamosis, partial right hepatectomy, resection of peritoneal metastatic lesions including 3 cm pelvic mass, fulguration of diaphragmatic lesions with argon, placement of bilateral tube thoracostomy tubes, intraoperative ultrasound   Central access: PICC consulted ordered  TPN start date: 02/12/24  Nutritional Goals: Goal TPN rate is 75 mL/hr (provides 90 g of protein and 1728 kcals per day)  RD Assessment: Estimated Needs Total Energy Estimated  Needs: 1600-1800 kcal Total Protein Estimated Needs: 80-100 gm Total Fluid Estimated Needs: >1.6L/day  Current Nutrition:  5/29 TPN + NPO 5/30 TPN + CLD  Plan:  Continue TPN at 75 mL/hr at 1800, to provide 1720 kcal and 90 AA meeting 100% nutrition needs Electrolytes in TPN: Na 45mEq/L, decrease to K 40 mEq/L, Ca 24mEq/L, increase to Mg 8 mEq/L, and Phos 70mmol/L. Cl:Ac 1:1 Add standard MVI and trace elements to TPN Add thiamine 100mg  IV to TPN x5 days Stop SSI and adjust/resume as needed  Monitor TPN labs on Mon/Thurs, daily as needed  Thank you for allowing pharmacy to be a part of this patient's care.  Claudia Cuff, PharmD, BCPS Clinical Pharmacist

## 2024-02-15 NOTE — Plan of Care (Signed)

## 2024-02-15 NOTE — Progress Notes (Signed)
 Per lab troponin result=72

## 2024-02-16 ENCOUNTER — Other Ambulatory Visit: Payer: Self-pay

## 2024-02-16 ENCOUNTER — Inpatient Hospital Stay (HOSPITAL_COMMUNITY)

## 2024-02-16 LAB — GLUCOSE, CAPILLARY
Glucose-Capillary: 133 mg/dL — ABNORMAL HIGH (ref 70–99)
Glucose-Capillary: 139 mg/dL — ABNORMAL HIGH (ref 70–99)

## 2024-02-16 LAB — TRIGLYCERIDES: Triglycerides: 107 mg/dL (ref <150)

## 2024-02-16 LAB — COMPREHENSIVE METABOLIC PANEL WITH GFR
ALT: 18 U/L (ref 0–44)
AST: 21 U/L (ref 15–41)
Albumin: 2.6 g/dL — ABNORMAL LOW (ref 3.5–5.0)
Alkaline Phosphatase: 57 U/L (ref 38–126)
Anion gap: 11 (ref 5–15)
BUN: 13 mg/dL (ref 6–20)
CO2: 24 mmol/L (ref 22–32)
Calcium: 8.6 mg/dL — ABNORMAL LOW (ref 8.9–10.3)
Chloride: 99 mmol/L (ref 98–111)
Creatinine, Ser: 0.38 mg/dL — ABNORMAL LOW (ref 0.44–1.00)
GFR, Estimated: >60 mL/min (ref >60)
Glucose, Bld: 130 mg/dL — ABNORMAL HIGH (ref 70–99)
Potassium: 4.5 mmol/L (ref 3.5–5.1)
Sodium: 134 mmol/L — ABNORMAL LOW (ref 135–145)
Total Bilirubin: 0.3 mg/dL (ref 0.0–1.2)
Total Protein: 5.8 g/dL — ABNORMAL LOW (ref 6.5–8.1)

## 2024-02-16 LAB — MAGNESIUM: Magnesium: 2.2 mg/dL (ref 1.7–2.4)

## 2024-02-16 LAB — PHOSPHORUS: Phosphorus: 4.4 mg/dL (ref 2.5–4.6)

## 2024-02-16 MED ORDER — IOHEXOL 300 MG/ML  SOLN
100.0000 mL | Freq: Once | INTRAMUSCULAR | Status: AC | PRN
  Administered 2024-02-16: 50 mL via ORAL

## 2024-02-16 MED ORDER — ACETAMINOPHEN 325 MG PO TABS
650.0000 mg | ORAL_TABLET | Freq: Four times a day (QID) | ORAL | Status: DC | PRN
  Administered 2024-02-16 – 2024-02-17 (×2): 650 mg via ORAL
  Filled 2024-02-16 (×2): qty 2

## 2024-02-16 MED ORDER — TRAVASOL 10 % IV SOLN
INTRAVENOUS | Status: AC
  Filled 2024-02-16: qty 900

## 2024-02-16 MED ORDER — TRAVASOL 10 % IV SOLN
INTRAVENOUS | Status: DC
  Filled 2024-02-16: qty 900

## 2024-02-16 MED ORDER — INSULIN ASPART 100 UNIT/ML IJ SOLN
0.0000 [IU] | INTRAMUSCULAR | Status: AC
  Administered 2024-02-16 – 2024-02-17 (×2): 1 [IU] via SUBCUTANEOUS

## 2024-02-16 NOTE — Plan of Care (Signed)

## 2024-02-16 NOTE — Progress Notes (Addendum)
 PHARMACY - TOTAL PARENTERAL NUTRITION CONSULT NOTE   Indication: Intolerance to enteral nutrition  Patient Measurements: Height: 5\' 2"  (157.5 cm) Weight: 56.3 kg (124 lb 1.9 oz) IBW/kg (Calculated) : 50.1 TPN AdjBW (KG): 59.9 Body mass index is 22.7 kg/m. Usual Weight: 130  Assessment:  48 y.o. female with metastatic GI stromal tumor, s/p exploratory laparotomy, debulking of metastatic GI stromal tumors with splenectomy, antrectomy, partial right hepatectomy . Delayed gastric emptying and unable to tolerate enteral feeds. Patient has had persistent nausea and frequent vomiting for the last few days, and per surgery's discussions with the patient has not had significant PO intake since surgery, only occasional small amounts of solid food. Suspect delayed gastric emptying. Pharmacy consulted to initiate TPN.   5/31 patient reported less nausea/vomiting. Able to tolerate Boost Breeze. Continuing on CLD.   Glucose / Insulin : CBG < 180, 0 units sSSI/24 hr Electrolytes: Na 134, K 4.5, CO2 24, Phos 4.4, Mg 2.2 Renal: stable at baseline (0.4-0.6), BUN WNL Hepatic: LFTs wnl, alb 2.6, TG 107 Intake / Output; MIVF: no output charted 5/30, LBM 5/31 scheduled metoclopramide  initiated 5/29 >  GI Imaging: 5/22:Multiple masses are identified in the pelvis measuring up to 6.3 x 5.0 cm GI Surgeries / Procedures:  5/15: Exploratory laparotomy: debulking of metastatic GI stromal tumors with splenectomy, antrectomy with billroth II anastamosis, partial right hepatectomy, resection of peritoneal metastatic lesions including 3 cm pelvic mass, fulguration of diaphragmatic lesions with argon, placement of bilateral tube thoracostomy tubes, intraoperative ultrasound   Central access: PICC consulted ordered  TPN start date: 02/12/24  Nutritional Goals: Goal TPN rate is 75 mL/hr (provides 90 g of protein and 1728 kcals per day)  RD Assessment: Estimated Needs Total Energy Estimated Needs: 1600-1800  kcal Total Protein Estimated Needs: 80-100 gm Total Fluid Estimated Needs: >1.6L/day  Current Nutrition:  5/29 TPN + NPO 5/30 TPN + CLD 6/2 TPN + sips with meds  Plan:  Will cycle TPN today per MD request - Cycle TPN over 18hrs for day1.  GIR 2.2-4.3 today. Rate 53-135ml/hr with 1hr cycle up and 1hr cycle down. Still to provide 1720 kcal and 90 AA meeting 100% nutrition needs  Electrolytes in TPN: Na 65 mEq/L, K 34 mEq/L, Ca 35mEq/L, Mg 8 mEq/L, and Phos 11 mmol/L. Cl:Ac 1:1 Add standard MVI and trace elements to TPN Add thiamine 100mg  IV to TPN x5 days Continue off SSI and adjust/resume as needed  Monitor TPN labs on Mon/Thurs, daily as needed. RFP tomorrow   Thank you for allowing pharmacy to be a part of this patient's care.  Oralee Billow, PharmD, BCCCP Clinical Pharmacist 02/16/2024 7:25 AM

## 2024-02-16 NOTE — Progress Notes (Signed)
 Nutrition Follow-up  DOCUMENTATION CODES:   Severe malnutrition in context of acute illness/injury  INTERVENTION:  TPN to meet 100% of estimated nutritional needs until pt is able to consume adequate nutrition via PO or enteral.  NUTRITION DIAGNOSIS:  Severe Malnutrition related to acute illness as evidenced by moderate fat depletion, moderate muscle depletion, energy intake < or equal to 50% for > or equal to 5 days. - Ongoing   GOAL:  Patient will meet greater than or equal to 90% of their needs - Being met via TPN  MONITOR:  Diet advancement, I & O's, Labs (TPN tolerance)  REASON FOR ASSESSMENT:  Consult Poor PO, New TPN/TNA  ASSESSMENT:  48 y.o Female with metastatic GI stromal tumor, admitted for planned operation. S/P EX lap with splenectomy, antrectomy with billroth II anastamosis, partial right hepatectomy, resection of peritoneal metastatic lesions, fulguration of diaphragmatic lesions, placement of bilateral thoracostomy tubes. PMH of breast cancer, anxiety, SBR (2022).  5/15: S/P EX lap with splenectomy, antrectomy with billroth II anastamosis, partial right hepatectomy, resection of peritoneal metastatic lesions, fulguration of diaphragmatic lesions, placement of bilateral thoracostomy tubes 5/17: NGT pulled,  CLD 5/20: FLD 5/21: SOFT diet  5/22 - Chest tubes removed  5/23: CT showed moderate right sided pneumothorax, no distress, awaiting right PTX to resolve before d/c  5/15: Regular diet, thin liquids  5/27: Chest tube replaced, pt became nauseated and vomited after oral intake. 200 ml bile emesis.  5/28: CXR pneumothorax resolved, multiple episodes of emesis. KUB showed normal bowel gas pattern, no gas distension.  5/29: Multiple episodes of emesis this morning, NPO, TPN started, chest tube removed   5/30: diet advanced to clear liquids 6/01: diet advanced to full liquids -> NPO in evening after emesis  Met with pt in room, pt up in chair. Reports that she did  not tolerate liquids over the weekend and now back NPO. Recently returned from swallow study, but does not know the results. Received medication for nausea to help keep meds down this morning. Discussed plan for TPN to cycle to allow time off pump.   Admit weight: 59.9 kg  Current weight: 56.3 kg (6/02)     Average Meal Intake: 5/22: 25% x 1 meal 5/25: 25% x 1 meal 5/26: 20-25% x 3 meals 5/27: 0% x 2 meals 5/27: 50% x 1 meal   Nutrition Related Medications: Reglan  q8h, Protonix  Labs: Sodium 134, Potassium 4.5, BUN 13, Creatinine 0.38, Phosphorus 4.4, Magnesium  2.2  CBG: 133-139 mg/dL x 24 hrs   Diet Order:   Diet Order             Diet NPO time specified Except for: Sips with Meds  Diet effective now                  EDUCATION NEEDS: Education needs have been addressed  Skin:  Skin Assessment: Skin Integrity Issues: Skin Integrity Issues:: Incisions Incisions: Abdomen  Last BM:  5/30  Height:  Ht Readings from Last 1 Encounters:  01/29/24 5\' 2"  (1.575 m)   Weight:  Wt Readings from Last 1 Encounters:  02/16/24 56.3 kg    Ideal Body Weight:  50 kg  BMI:  Body mass index is 22.7 kg/m.  Estimated Nutritional Needs:  Kcal:  1600-1800 kcal Protein:  80-100 gm Fluid:  >1.6L/day   Doneta Furbish RD, LDN Clinical Dietitian

## 2024-02-16 NOTE — Progress Notes (Signed)
   02/16/24 1143  Vitals  Temp 98.6 F (37 C)  Temp Source Oral  BP (!) 118/51  MAP (mmHg) 67  BP Location Left Arm  BP Method Automatic  Patient Position (if appropriate) Sitting  Pulse Rate 85  Pulse Rate Source Monitor  Resp 16  Level of Consciousness  Level of Consciousness Alert  MEWS COLOR  MEWS Score Color Green  Oxygen Therapy  SpO2 96 %  O2 Device Room Air  Pain Assessment  Pain Scale 0-10  Pain Score 0  MEWS Score  MEWS Temp 0  MEWS Systolic 0  MEWS Pulse 0  MEWS RR 0  MEWS LOC 0  MEWS Score 0   Pt return to unit from Fluorsoscopy.

## 2024-02-16 NOTE — Progress Notes (Signed)
 Pt off unit to fluoroscopy.

## 2024-02-16 NOTE — Progress Notes (Signed)
 OT NOTE  Pt is due to goal update and noted to have met all goals at S or mod I. Pt walking the hall with daughter no medical staff required. Pt is indep going to and from the bathroom in the room at present. All OT goals met and will sign off acutely  [employer], OTR/L  Acute Rehabilitation Services Office: [phone] .

## 2024-02-16 NOTE — Plan of Care (Signed)
  Problem: Education: Goal: Knowledge of General Education information will improve Description: Including pain rating scale, medication(s)/side effects and non-pharmacologic comfort measures Outcome: Progressing   Problem: Health Behavior/Discharge Planning: Goal: Ability to manage health-related needs will improve Outcome: Progressing   Problem: Clinical Measurements: Goal: Respiratory complications will improve Outcome: Progressing   Problem: Activity: Goal: Risk for activity intolerance will decrease Outcome: Progressing   Problem: Coping: Goal: Level of anxiety will decrease Outcome: Progressing   Problem: Pain Managment: Goal: General experience of comfort will improve and/or be controlled Outcome: Progressing   Problem: Safety: Goal: Ability to remain free from injury will improve Outcome: Progressing

## 2024-02-16 NOTE — Progress Notes (Signed)
 18 Days Post-Op  Subjective: CC: Patient continued to have some nausea and vomiting yesterday so was made NPO.  Objective: Vital signs in last 24 hours: Temp:  [98.3 F (36.8 C)-98.9 F (37.2 C)] 98.6 F (37 C) (06/02 1143) Pulse Rate:  [82-99] 85 (06/02 1143) Resp:  [16-18] 16 (06/02 1143) BP: (105-126)/(48-64) 118/51 (06/02 1143) SpO2:  [88 %-97 %] 96 % (06/02 1143) Weight:  [56.3 kg] 56.3 kg (06/02 0322) Last BM Date : 02/13/24  Intake/Output from previous day: 06/01 0701 - 06/02 0700 In: 1611.7 [I.V.:1611.7] Out: -  Intake/Output this shift: No intake/output data recorded.  PE: Gen:  Alert, NAD, pleasant Card:  Reg rate Pulm:  CTAB, no W/R/R, effort normal Abd: Soft, ND, NT, +BS, incision with steri-strips in place, cdi Ext:  No LE edema or calf tenderness Psych: A&Ox3  Skin: no rashes noted, warm and dry  Lab Results:  No results for input(s): "WBC", "HGB", "HCT", "PLT" in the last 72 hours.  BMET Recent Labs    02/15/24 0650 02/16/24 0500  NA 134* 134*  K 4.6 4.5  CL 100 99  CO2 26 24  GLUCOSE 105* 130*  BUN 12 13  CREATININE 0.36* 0.38*  CALCIUM  8.5* 8.6*   PT/INR No results for input(s): "LABPROT", "INR" in the last 72 hours. CMP     Component Value Date/Time   NA 134 (L) 02/16/2024 0500   NA 143 10/14/2023 1428   K 4.5 02/16/2024 0500   CL 99 02/16/2024 0500   CO2 24 02/16/2024 0500   GLUCOSE 130 (H) 02/16/2024 0500   BUN 13 02/16/2024 0500   BUN 12 10/14/2023 1428   CREATININE 0.38 (L) 02/16/2024 0500   CREATININE 0.76 01/19/2024 0748   CALCIUM  8.6 (L) 02/16/2024 0500   PROT 5.8 (L) 02/16/2024 0500   ALBUMIN  2.6 (L) 02/16/2024 0500   AST 21 02/16/2024 0500   AST 26 01/19/2024 0748   ALT 18 02/16/2024 0500   ALT 20 01/19/2024 0748   ALKPHOS 57 02/16/2024 0500   BILITOT 0.3 02/16/2024 0500   BILITOT 0.4 01/19/2024 0748   GFRNONAA >60 02/16/2024 0500   GFRNONAA >60 01/19/2024 0748   GFRAA >60 08/03/2018 0801   Lipase      Component Value Date/Time   LIPASE 19 02/17/2021 1052    Studies/Results: DG Abd Portable 1V Result Date: 02/15/2024 CLINICAL DATA:  Nausea and vomiting EXAM: PORTABLE ABDOMEN - 1 VIEW COMPARISON:  Abdominal x-ray 02/11/2024. CT abdomen and pelvis 02/04/2022. FINDINGS: No dilated bowel loops are present. Linear area of air tracking along the left lateral abdomen suspicious for colonic wall thickening. Free air is also possibility on this supine view. Are numerous surgical clips in the right upper quadrant and surgical staple line in the left abdomen. No suspicious calcifications are seen. Lung bases are clear. No acute fractures are seen. IMPRESSION: Linear area of air tracking along the left lateral abdomen suspicious for colonic wall thickening. Free air is also possibility on this supine view. Recommend further evaluation with CT abdomen and pelvis. Electronically Signed   By: Tyron Gallon M.D.   On: 02/15/2024 19:57     Anti-infectives: Anti-infectives (From admission, onward)    Start     Dose/Rate Route Frequency Ordered Stop   01/29/24 2200  ceFAZolin  (ANCEF ) IVPB 2g/100 mL premix        2 g 200 mL/hr over 30 Minutes Intravenous Every 8 hours 01/29/24 1840 01/29/24 2153   01/29/24 0700  ceFAZolin  (  ANCEF ) IVPB 2g/100 mL premix        2 g 200 mL/hr over 30 Minutes Intravenous On call to O.R. 01/29/24 0653 01/29/24 1331        Assessment/Plan Postop day 18 s/p Exploratory laparotomy, debulking of metastatic GI stromal tumors with splenectomy, antrectomy with billroth II anastamosis, partial right hepatectomy, resection of peritoneal metastatic lesions including 3 cm pelvic mass, fulguration of diaphragmatic lesions with argon, placement of bilateral thoracostomy tubes, intraoperative ultrasound 01/29/2024 Anne Horton for recurrent GI stromal tumor   - A-fib: Evaluated by cards, increased metoprolol  5/22. Echo looks ok. HR well-controlled.  - Pain control: Prn oxycodone  and robaxin .  Dilaudid  for breakthrough - Effexor , PRN xanax  anxiety/hot flashes  - Tamoxifen  is on hold for VTE risk - Ambulate, pulmonary toilet, IS - R chest tube removed 5/29, follow up CXR 5/29 with very minimal residual apical pneumothorax. - Nausea/vomiting: Upper GI today.  Evaluating for motility issues versus anatomical obstruction.  If patient needs anything surgically done would be nice to do that while she is here.  If this is delayed gastric emptying however would plan to investigate sending the patient home on cycle TPN.  Will ask pharmacy to work on that.  Will also ask transitions of care to see if this is a valid option for her discharge. - Wound care: incisional staples removed on POD14 and steristrips placed. - FEN: Patient has had poor PO intake secondary to nausea and vomiting. TPN started 5/29, continue until patient is tolerating PO intake. Replete electrolytes as needed. - VTE prophylaxis - scds, lovenox . On ASA for thrombocytosis. - Dispo: med-surg status.    LOS: 18 days    Lockie Rima, MD [facility] Surgery 02/16/2024, 2:47 PM Please see Amion for pager number during day hours 7:00am-4:30pm

## 2024-02-16 NOTE — TOC Initial Note (Signed)
 Transition of Care (TOC) - Initial/Assessment Note  Sherin Dingwall RN,BSN Transitions of Care Unit 4NP (Non Trauma)- RN Case Manager See Treatment Team for direct Phone #   Patient Details  Name: Anne Horton MRN: [id number] Date of Birth: 09/07/1976  Transition of Care [facility]) CM/SW Contact:    Rox Cope, RN Phone Number: 02/16/2024, 4:06 PM  Clinical Narrative:                 Referral received for potential home TPN needs.   CM spoke with pt at bedside, daughter also present and permission given by pt to speak in daughter's presence. Pt from home with good family support, pt voiced she has RW at home, no new DME needs noted.  Discussed potential need for home TPN/PICC line care.   Choice offered for HH/Infusion, CM has reached out to [facility] pharmacy liaison to assist with home TPN needs- liaison Pam will have her team look at pt's benefit coverage for TPN.  Pending on final plan Pam will follow up with pt and can provide needed education for home TPN needs prior to discharge.   TOC will follow up with pt on final plan  Expected Discharge Plan: Home w Home Health Services Barriers to Discharge: Continued Medical Work up   Patient Goals and CMS Choice Patient states their goals for this hospitalization and ongoing recovery are:: return home w/ family CMS Medicare.gov Compare Post Acute Care list provided to:: Patient Choice offered to / list presented to : Patient      Expected Discharge Plan and Services   Discharge Planning Services: CM Consult Post Acute Care Choice: Home Health Living arrangements for the past 2 months: Single Family Home                 DME Arranged: N/A DME Agency: NA       HH Arranged: TPN, RN HH Agency: [facility] Date HH Agency Contacted: 02/16/24 Time HH Agency Contacted: 1200 Representative spoke with at [facility] Agency: Pam  Prior Living Arrangements/Services Living arrangements for the past 2 months: Single  Family Home Lives with:: Self, Spouse Patient language and need for interpreter reviewed:: Yes Do you feel safe going back to the place where you live?: Yes      Need for Family Participation in Patient Care: Yes (Comment)   Current home services: DME (rolling walker) Criminal Activity/Legal Involvement Pertinent to Current Situation/Hospitalization: No - Comment as needed  Activities of Daily Living   ADL Screening (condition at time of admission) Independently performs ADLs?: Yes (appropriate for developmental age) Is the patient deaf or have difficulty hearing?: No Does the patient have difficulty seeing, even when wearing glasses/contacts?: No Does the patient have difficulty concentrating, remembering, or making decisions?: No  Permission Sought/Granted Permission sought to share information with : Oceanographer granted to share information with : Yes, Verbal Permission Granted     Permission granted to share info w AGENCY: HH        Emotional Assessment Appearance:: Appears stated age Attitude/Demeanor/Rapport: Engaged   Orientation: : Oriented to Self, Oriented to Place, Oriented to  Time, Oriented to Situation Alcohol / Substance Use: Not Applicable Psych Involvement: No (comment)  Admission diagnosis:  Gastrointestinal stromal tumor of small intestine (HCC) [C49.A3] 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] Malignant neoplasm of upper-outer quadrant of left female breast, unspecified estrogen receptor status (HCC) [C50.412] Gastrointestinal stromal tumor (GIST) (HCC) [C49.A0] GIST (gastrointestinal stromal tumor), malignant (  HCC) Hendrix.Hales.A0] Patient Active Problem List   Diagnosis Date Noted   Protein-calorie malnutrition, severe 02/12/2024   Atrial fibrillation with rapid ventricular response (HCC) 02/04/2024   Gastrointestinal stromal tumor (GIST) (HCC) 01/29/2024   GIST (gastrointestinal stromal  tumor), malignant (HCC) 01/29/2024   Pelvic mass 04/09/2023   Adnexal mass 03/18/2023   GIST (gastrointestinal stromal tumor) of small bowel, malignant (HCC) 04/23/2021   Intraabdominal mass 04/03/2021   Genetic testing 04/16/2018   Malignant neoplasm of upper-outer quadrant of left breast in female, estrogen receptor positive (HCC) 03/25/2018   Family history of breast cancer 03/24/2018   Anemia, iron  deficiency 10/20/2014   Chest pain    Palpitations    Neurofibromatosis (HCC)    Tachycardia    Hx of migraines    H/O: depression    Mitral regurgitation    BLOOD IN STOOL 01/12/2009   BLOOD IN STOOL, OCCULT 01/12/2009   PCP:  [name], OT Pharmacy:   [facility] - [facility] [address] [place] [address] Phone: [phone] Fax: [phone]  [id number] - [place], [place] - [address] [facility] [place] [address] Phone: [phone] Fax: [phone]     Social Drivers of Health (SDOH) Social History: SDOH Screenings   Food Insecurity: No Food Insecurity (02/03/2024)  Housing: Low Risk  (02/03/2024)  Transportation Needs: No Transportation Needs (02/03/2024)  Utilities: Not At Risk (02/03/2024)  Financial Resource Strain: Low Risk  (11/17/2020)   Received from [facility] visits prior to 11/16/2022., [facility] visits prior to 11/16/2022.  Physical Activity: Inactive (11/17/2020)   Received from [facility] visits prior to 11/16/2022., [facility] visits prior to 11/16/2022.  Social Connections: Moderately Isolated (11/17/2020)   Received from [facility] visits prior to 11/16/2022., [facility] visits prior to 11/16/2022.  Stress: Stress Concern Present (11/17/2020)   Received from [facility] visits prior to 11/16/2022., [facility] visits prior to 11/16/2022.   Tobacco Use: Low Risk  (01/29/2024)   SDOH Interventions:     Readmission Risk Interventions     No data to display

## 2024-02-17 LAB — RENAL FUNCTION PANEL
Albumin: 2.5 g/dL — ABNORMAL LOW (ref 3.5–5.0)
Anion gap: 4 — ABNORMAL LOW (ref 5–15)
BUN: 18 mg/dL (ref 6–20)
CO2: 29 mmol/L (ref 22–32)
Calcium: 8.5 mg/dL — ABNORMAL LOW (ref 8.9–10.3)
Chloride: 105 mmol/L (ref 98–111)
Creatinine, Ser: 0.46 mg/dL (ref 0.44–1.00)
GFR, Estimated: >60 mL/min (ref >60)
Glucose, Bld: 119 mg/dL — ABNORMAL HIGH (ref 70–99)
Phosphorus: 4.5 mg/dL (ref 2.5–4.6)
Potassium: 4.9 mmol/L (ref 3.5–5.1)
Sodium: 138 mmol/L (ref 135–145)

## 2024-02-17 LAB — GLUCOSE, CAPILLARY
Glucose-Capillary: 130 mg/dL — ABNORMAL HIGH (ref 70–99)
Glucose-Capillary: 130 mg/dL — ABNORMAL HIGH (ref 70–99)
Glucose-Capillary: 105 mg/dL — ABNORMAL HIGH (ref 70–99)
Glucose-Capillary: 95 mg/dL (ref 70–99)
Glucose-Capillary: 148 mg/dL — ABNORMAL HIGH (ref 70–99)

## 2024-02-17 MED ORDER — TRAVASOL 10 % IV SOLN
INTRAVENOUS | Status: AC
  Filled 2024-02-17: qty 900

## 2024-02-17 MED ORDER — INSULIN ASPART 100 UNIT/ML IJ SOLN
0.0000 [IU] | INTRAMUSCULAR | Status: DC
  Administered 2024-02-17 – 2024-02-18 (×2): 1 [IU] via SUBCUTANEOUS

## 2024-02-17 NOTE — Progress Notes (Signed)
 PHARMACY - TOTAL PARENTERAL NUTRITION CONSULT NOTE   Indication: Intolerance to enteral nutrition  Patient Measurements: Height: 5\' 2"  (157.5 cm) Weight: 57.4 kg (126 lb 8.7 oz) IBW/kg (Calculated) : 50.1 TPN AdjBW (KG): 59.9 Body mass index is 23.15 kg/m. Usual Weight: 130  Assessment:  48 y.o. female with metastatic GI stromal tumor, s/p exploratory laparotomy, debulking of metastatic GI stromal tumors with splenectomy, antrectomy, partial right hepatectomy . Delayed gastric emptying and unable to tolerate enteral feeds. Patient has had persistent nausea and frequent vomiting for the last few days, and per surgery's discussions with the patient has not had significant PO intake since surgery, only occasional small amounts of solid food. Suspect delayed gastric emptying. Pharmacy consulted to initiate TPN.   5/31 patient reported less nausea/vomiting. Able to tolerate Boost Breeze. Continuing on CLD.   Glucose / Insulin : CBG < 180, 2 units sSSI/24 hr Electrolytes: Na 138, K 4.9, Phos 4.5, Mg 2.2, CoCa 9.7 Renal: stable at baseline (0.4-0.6), BUN WNL Hepatic: LFTs wnl, alb 2.5, TG 107 Intake / Output; MIVF: no output charted 5/30, LBM 5/31 scheduled metoclopramide  initiated 5/29 >  GI Imaging: 5/22:Multiple masses are identified in the pelvis measuring up to 6.3 x 5.0 cm GI Surgeries / Procedures:  5/15: Exploratory laparotomy: debulking of metastatic GI stromal tumors with splenectomy, antrectomy with billroth II anastamosis, partial right hepatectomy, resection of peritoneal metastatic lesions including 3 cm pelvic mass, fulguration of diaphragmatic lesions with argon, placement of bilateral tube thoracostomy tubes, intraoperative ultrasound   Central access: PICC consulted ordered  TPN start date: 02/12/24  Nutritional Goals: Goal TPN rate is 75 mL/hr (provides 90 g of protein and 1728 kcals per day)  RD Assessment: Estimated Needs Total Energy Estimated Needs: 1600-1800  kcal Total Protein Estimated Needs: 80-100 gm Total Fluid Estimated Needs: >1.6L/day  Current Nutrition:  5/29 TPN + NPO 5/30 TPN + CLD 6/2 TPN + sips with meds  Plan:  Advance cycle TPN today - Cycle TPN over 14 hrs for day2 cycling. GIR 2.8-5.6 today. Rate 69-159ml/hr with 1hr cycle up and 1hr cycle down. Still to provide 1720 kcal and 90 AA meeting 100% nutrition needs  Electrolytes in TPN: Na 62 mEq/L, K 28 mEq/L, Ca 63mEq/L, Mg 8 mEq/L, and Phos 11 mmol/L. Cl:Ac 1:1 Continue standard MVI and trace elements to TPN S/p thiamine 100mg  IV to TPN x5 days Retime SSI insulin  to comply with cycling standards Monitor TPN labs on Mon/Thurs, daily as needed. RFP tomorrow   Thank you for allowing pharmacy to be a part of this patient's care.  Oralee Billow, PharmD, BCCCP Clinical Pharmacist 02/17/2024 7:37 AM

## 2024-02-17 NOTE — Progress Notes (Addendum)
 19 Days Post-Op  Subjective: CC: Upper GI negative for motility issue or obstruction. Less n/v today  Objective: Vital signs in last 24 hours: Temp:  [98.2 F (36.8 C)-99.6 F (37.6 C)] 98.5 F (36.9 C) (06/03 1606) Pulse Rate:  [83-95] 89 (06/03 1606) Resp:  [16-20] 18 (06/03 1606) BP: (113-125)/(45-60) 116/51 (06/03 1606) SpO2:  [93 %-98 %] 95 % (06/03 1606) Weight:  [57.4 kg] 57.4 kg (06/03 0500) Last BM Date : 02/13/24  Intake/Output from previous day: 06/02 0701 - 06/03 0700 In: 1208.7 [I.V.:1208.7] Out: -  Intake/Output this shift: Total I/O In: 708.1 [P.O.:120; I.V.:588.1] Out: -   PE: Gen:  Alert, NAD, pleasant Pulm:  no resp distress.  Abd: Soft, ND, NT, +BS, incision with steri-strips in place, cdi Psych: A&Ox3  Skin: no rashes noted, warm and dry  Lab Results:  No results for input(s): "WBC", "HGB", "HCT", "PLT" in the last 72 hours.  BMET Recent Labs    02/16/24 0500 02/17/24 0430  NA 134* 138  K 4.5 4.9  CL 99 105  CO2 24 29  GLUCOSE 130* 119*  BUN 13 18  CREATININE 0.38* 0.46  CALCIUM  8.6* 8.5*   PT/INR No results for input(s): "LABPROT", "INR" in the last 72 hours. CMP     Component Value Date/Time   NA 138 02/17/2024 0430   NA 143 10/14/2023 1428   K 4.9 02/17/2024 0430   CL 105 02/17/2024 0430   CO2 29 02/17/2024 0430   GLUCOSE 119 (H) 02/17/2024 0430   BUN 18 02/17/2024 0430   BUN 12 10/14/2023 1428   CREATININE 0.46 02/17/2024 0430   CREATININE 0.76 01/19/2024 0748   CALCIUM  8.5 (L) 02/17/2024 0430   PROT 5.8 (L) 02/16/2024 0500   ALBUMIN  2.5 (L) 02/17/2024 0430   AST 21 02/16/2024 0500   AST 26 01/19/2024 0748   ALT 18 02/16/2024 0500   ALT 20 01/19/2024 0748   ALKPHOS 57 02/16/2024 0500   BILITOT 0.3 02/16/2024 0500   BILITOT 0.4 01/19/2024 0748   GFRNONAA >60 02/17/2024 0430   GFRNONAA >60 01/19/2024 0748   GFRAA >60 08/03/2018 0801   Lipase     Component Value Date/Time   LIPASE 19 02/17/2021 1052     Studies/Results: DG UGI W SINGLE CM (SOL OR THIN BA) Result Date: 02/16/2024 CLINICAL DATA:  49 year old female status post debulking of metastatic GI stromal tumor with splenectomy, partial hepatectomy, antrectomy with Billroth II anastomosis on 01/29/2024 presents with postprandial nausea vomiting, unable to advance diet from clear liquid diet. Request for UGI to eval dysmotility and outlet obstruction. EXAM: DG UGI W SINGLE CM TECHNIQUE: Scout radiograph was obtained. Single contrast examination was performed using water -soluble contrast. This exam was performed by Marcie Sever, PA-C, and was supervised and interpreted by Marland Silvas, MD. FLUOROSCOPY: Radiation Exposure Index (as provided by the fluoroscopic device): 15.6 mGy Kerma COMPARISON:  None Available. FINDINGS: Scout Radiograph: Surgical clips in the right upper quadrant and surgical staple lines in the left upper quadrant compatible with patient's surgical history. Nonspecific gas pattern. Esophagus: Limited exam of the esophagus showed smooth contour of the esophagus without esophageal mucosal abnormality, stricture, or mass. Esophageal motility: Patient could not tolerate prone position, motility examined in supine RPO position. No esophageal dysmotility. Gastroesophageal reflux:  None visualized. Ingested 13mm barium tablet:  Not given. Stomach: Gastric anatomy compatible with patient's surgical history. Water -soluble contrast stayed in the gastric body for approximately 3 minute, then flowed freely into the small  bowel. Gastric emptying: Not examined. Duodenum:  Not examined. Other: Patient became nauseous at the end of the exam, chest x-ray showed no contrast in the bronchial tree. IMPRESSION: No esophageal dysmotility or gastric obstruction. Electronically Signed   By: Nicoletta Barrier M.D.   On: 02/16/2024 14:51     Anti-infectives: Anti-infectives (From admission, onward)    Start     Dose/Rate Route Frequency Ordered Stop   01/29/24  2200  ceFAZolin  (ANCEF ) IVPB 2g/100 mL premix        2 g 200 mL/hr over 30 Minutes Intravenous Every 8 hours 01/29/24 1840 01/29/24 2153   01/29/24 0700  ceFAZolin  (ANCEF ) IVPB 2g/100 mL premix        2 g 200 mL/hr over 30 Minutes Intravenous On call to O.R. 01/29/24 0653 01/29/24 1331        Assessment/Plan Postop day 19 s/p Exploratory laparotomy, debulking of metastatic GI stromal tumors with splenectomy, antrectomy with billroth II anastamosis, partial right hepatectomy, resection of peritoneal metastatic lesions including 3 cm pelvic mass, fulguration of diaphragmatic lesions with argon, placement of bilateral thoracostomy tubes, intraoperative ultrasound 01/29/2024 Laurielle Selmon for recurrent GI stromal tumor   - A-fib: Evaluated by cards, increased metoprolol  5/22. Echo looks ok. HR well-controlled.  - Pain control: Prn oxycodone  and robaxin . Dilaudid  for breakthrough - Effexor , PRN xanax  anxiety/hot flashes  - Tamoxifen  is on hold for VTE risk - Ambulate, pulmonary toilet, IS - R chest tube removed 5/29, follow up CXR 5/29 with very minimal residual apical pneumothorax. - Nausea/vomiting: try advancing diet again.  Restart diet.  Patient to try small bites of different foods to see if any of these are better in terms of n/v.  Do not anticipate that she will be able to eat much.  This is a trial.   See if home tpn an option.  - Wound care: incisional staples removed on POD14 and steristrips placed. - FEN: Patient has had poor PO intake secondary to nausea and vomiting. TPN started 5/29, continue until patient is tolerating PO intake. Replete electrolytes as needed. - VTE prophylaxis - scds, lovenox . On ASA for thrombocytosis. - Dispo: med-surg status.    LOS: 19 days    Lockie Rima, MD [facility] Surgery 02/17/2024, 6:45 PM Please see Amion for pager number during day hours 7:00am-4:30pm

## 2024-02-17 NOTE — Plan of Care (Signed)

## 2024-02-18 ENCOUNTER — Other Ambulatory Visit (HOSPITAL_COMMUNITY): Payer: Self-pay

## 2024-02-18 LAB — GLUCOSE, CAPILLARY
Glucose-Capillary: 132 mg/dL — ABNORMAL HIGH (ref 70–99)
Glucose-Capillary: 110 mg/dL — ABNORMAL HIGH (ref 70–99)
Glucose-Capillary: 152 mg/dL — ABNORMAL HIGH (ref 70–99)

## 2024-02-18 LAB — RENAL FUNCTION PANEL
Albumin: 2.4 g/dL — ABNORMAL LOW (ref 3.5–5.0)
Anion gap: 7 (ref 5–15)
BUN: 20 mg/dL (ref 6–20)
CO2: 25 mmol/L (ref 22–32)
Calcium: 8.3 mg/dL — ABNORMAL LOW (ref 8.9–10.3)
Chloride: 103 mmol/L (ref 98–111)
Creatinine, Ser: 0.38 mg/dL — ABNORMAL LOW (ref 0.44–1.00)
GFR, Estimated: >60 mL/min (ref >60)
Glucose, Bld: 121 mg/dL — ABNORMAL HIGH (ref 70–99)
Phosphorus: 3.5 mg/dL (ref 2.5–4.6)
Potassium: 4.4 mmol/L (ref 3.5–5.1)
Sodium: 135 mmol/L (ref 135–145)

## 2024-02-18 MED ORDER — ENSURE PLUS HIGH PROTEIN PO LIQD
237.0000 mL | Freq: Two times a day (BID) | ORAL | Status: DC
  Administered 2024-02-19: 237 mL via ORAL

## 2024-02-18 MED ORDER — ALUM & MAG HYDROXIDE-SIMETH 200-200-20 MG/5ML PO SUSP
30.0000 mL | ORAL | Status: DC | PRN
  Administered 2024-02-18: 30 mL via ORAL
  Filled 2024-02-18: qty 30

## 2024-02-18 MED ORDER — ALUM & MAG HYDROXIDE-SIMETH 200-200-20 MG/5ML PO SUSP
30.0000 mL | ORAL | 0 refills | Status: DC | PRN
  Filled 2024-02-18: qty 355, 2d supply, fill #0

## 2024-02-18 MED ORDER — ENSURE PLUS HIGH PROTEIN PO LIQD: 237.0000 mL | Freq: Two times a day (BID) | ORAL | 10 refills | Status: DC

## 2024-02-18 MED ORDER — POLYETHYLENE GLYCOL 3350 17 G PO PACK
17.0000 g | PACK | Freq: Every day | ORAL | 0 refills | Status: DC | PRN
  Filled 2024-02-18: qty 14, 14d supply, fill #0

## 2024-02-18 MED ORDER — SODIUM CHLORIDE 0.9% FLUSH: 10.0000 mL | INTRAVENOUS | Status: DC | PRN

## 2024-02-18 MED ORDER — TRAVASOL 10 % IV SOLN
INTRAVENOUS | Status: AC
  Filled 2024-02-18: qty 848

## 2024-02-18 NOTE — Progress Notes (Signed)
 Patient called RN to room to report emesis occurrence.    Patient denied any nausea post emesis episode.  Patient has hypoactive bowel sounds, has had bowel movement, and is passing flatulence.   Per patient, she tolerated breakfast fine.  Patient stated that the odor of her lunch tray (broccoli) caused patient to feel nauseous and was the factor that caused her emesis.  RN ensured patient had menu to be able to place food orders that appealed to her to help limit precipitating nausea factors such as undesirable food odors.

## 2024-02-18 NOTE — Progress Notes (Signed)
 Nutrition Brief Note  Patient sitting in chair on visit thad 1 piece of french toast this morning and a Boost Breeze for breakfast. No nausea, vomiting, or abdominal pain after eating. TPN continuing. TOC seeing if home TPN is an option as pt will most likely not be able to meet her needs by PO alone.   Per pharmacy: Cycle goal TPN over 14 hrs at 62-123 ml/hr with 1hr cycle up and 1hr cycle down, (GIR 2.7-5.3), meeting 100% nutrition needs   INTERVENTION:  TPN to meet 100% of estimated nutritional needs until pt is able to consume adequate nutrition via PO or enteral. Ensure Enlive po BID, each supplement provides 350 kcal and 20 grams of protein.  Frederik Jansky, RD Registered Dietitian  See Amion for more information

## 2024-02-18 NOTE — Plan of Care (Signed)

## 2024-02-18 NOTE — Progress Notes (Signed)
 20 Days Post-Op  Subjective: CC: Tried some clears that were ok yesterday.  Had some french toast this AM.    Objective: Vital signs in last 24 hours: Temp:  [98.1 F (36.7 C)-98.6 F (37 C)] 98.5 F (36.9 C) (06/04 1239) Pulse Rate:  [80-98] 81 (06/04 1239) Resp:  [16-18] 16 (06/04 1239) BP: (98-128)/(39-67) 109/50 (06/04 1239) SpO2:  [95 %-98 %] 98 % (06/04 1239) Weight:  [58.2 kg] 58.2 kg (06/04 0332) Last BM Date : 02/13/24  Intake/Output from previous day: 06/03 0701 - 06/04 0700 In: 2182 [P.O.:120; I.V.:2062] Out: -  Intake/Output this shift: Total I/O In: 370.1 [P.O.:120; I.V.:250.1] Out: -   PE: Gen:  Alert, NAD, pleasant Pulm:  no resp distress.  Abd: Soft, ND, NT, +BS, incision with steri-strips in place, cdi Psych: A&Ox3  Skin: no rashes noted, warm and dry  Lab Results:  No results for input(s): "WBC", "HGB", "HCT", "PLT" in the last 72 hours.  BMET Recent Labs    02/17/24 0430 02/18/24 0505  NA 138 135  K 4.9 4.4  CL 105 103  CO2 29 25  GLUCOSE 119* 121*  BUN 18 20  CREATININE 0.46 0.38*  CALCIUM  8.5* 8.3*   PT/INR No results for input(s): "LABPROT", "INR" in the last 72 hours. CMP     Component Value Date/Time   NA 135 02/18/2024 0505   NA 143 10/14/2023 1428   K 4.4 02/18/2024 0505   CL 103 02/18/2024 0505   CO2 25 02/18/2024 0505   GLUCOSE 121 (H) 02/18/2024 0505   BUN 20 02/18/2024 0505   BUN 12 10/14/2023 1428   CREATININE 0.38 (L) 02/18/2024 0505   CREATININE 0.76 01/19/2024 0748   CALCIUM  8.3 (L) 02/18/2024 0505   PROT 5.8 (L) 02/16/2024 0500   ALBUMIN  2.4 (L) 02/18/2024 0505   AST 21 02/16/2024 0500   AST 26 01/19/2024 0748   ALT 18 02/16/2024 0500   ALT 20 01/19/2024 0748   ALKPHOS 57 02/16/2024 0500   BILITOT 0.3 02/16/2024 0500   BILITOT 0.4 01/19/2024 0748   GFRNONAA >60 02/18/2024 0505   GFRNONAA >60 01/19/2024 0748   GFRAA >60 08/03/2018 0801   Lipase     Component Value Date/Time   LIPASE 19  02/17/2021 1052    Studies/Results: No results found.    Anti-infectives: Anti-infectives (From admission, onward)    Start     Dose/Rate Route Frequency Ordered Stop   01/29/24 2200  ceFAZolin  (ANCEF ) IVPB 2g/100 mL premix        2 g 200 mL/hr over 30 Minutes Intravenous Every 8 hours 01/29/24 1840 01/29/24 2153   01/29/24 0700  ceFAZolin  (ANCEF ) IVPB 2g/100 mL premix        2 g 200 mL/hr over 30 Minutes Intravenous On call to O.R. 01/29/24 0653 01/29/24 1331        Assessment/Plan Postop day 20 s/p Exploratory laparotomy, debulking of metastatic GI stromal tumors with splenectomy, antrectomy with billroth II anastamosis, partial right hepatectomy, resection of peritoneal metastatic lesions including 3 cm pelvic mass, fulguration of diaphragmatic lesions with argon, placement of bilateral thoracostomy tubes, intraoperative ultrasound 01/29/2024 Anne Horton for recurrent GI stromal tumor   - A-fib: Evaluated by cards, increased metoprolol  5/22. Echo looks ok. HR well-controlled.  - Pain control: Prn oxycodone  and robaxin . Dilaudid  for breakthrough - Effexor , PRN xanax  anxiety/hot flashes  - Tamoxifen  is on hold for VTE risk - Ambulate, pulmonary toilet, IS - R chest tube removed 5/29, follow  up CXR 5/29 with very minimal residual apical pneumothorax. - Nausea/vomiting: try advancing diet again.  Restart diet.  Patient to try small bites of different foods to see if any of these are better in terms of n/v.  Do not anticipate that she will be able to eat much.  This is a trial.   Waiting to see if home tpn an option.  - Wound care: incisional staples removed on POD14 and steristrips placed. - FEN: Patient has had poor PO intake secondary to nausea and vomiting. TPN started 5/29, continue until patient is tolerating PO intake. Replete electrolytes as needed. - VTE prophylaxis - scds, lovenox . On ASA for thrombocytosis. - Dispo: med-surg status.    LOS: 20 days    Anne Rima,  MD [facility] Surgery 02/18/2024, 3:38 PM Please see Amion for pager number during day hours 7:00am-4:30pm

## 2024-02-18 NOTE — Progress Notes (Addendum)
 PHARMACY - TOTAL PARENTERAL NUTRITION CONSULT NOTE   Indication: Intolerance to enteral nutrition  Patient Measurements: Height: 5\' 2"  (157.5 cm) Weight: 58.2 kg (128 lb 4.9 oz) IBW/kg (Calculated) : 50.1 TPN AdjBW (KG): 59.9 Body mass index is 23.47 kg/m. Usual Weight: 130  Assessment:  48 y.o. female with metastatic GI stromal tumor, s/p exploratory laparotomy, debulking of metastatic GI stromal tumors with splenectomy, antrectomy, partial right hepatectomy . Delayed gastric emptying and unable to tolerate enteral feeds. Patient has had persistent nausea and frequent vomiting for the last few days, and per surgery's discussions with the patient has not had significant PO intake since surgery, only occasional small amounts of solid food. Suspect delayed gastric emptying. Pharmacy consulted to initiate TPN.   5/31 patient reported less nausea/vomiting. Able to tolerate Boost Breeze. Continuing on CLD.   Glucose / Insulin : CBG < 150, used 2 units sSSI/24 hr Electrolytes: Na 135 down, CoCa 9.6, others wnl  Renal: Scr 0.38 (0.4-0.6), BUN WNL Hepatic: LFTs wnl, alb 2.4, TG 107 Intake / Output; MIVF: no output charted, LBM 5/31 scheduled metoclopramide  initiated 5/29 >  GI Imaging: 5/22:Multiple masses are identified in the pelvis measuring up to 6.3 x 5.0 cm GI Surgeries / Procedures:  5/15: Exploratory laparotomy: debulking of metastatic GI stromal tumors with splenectomy, antrectomy with billroth II anastamosis, partial right hepatectomy, resection of peritoneal metastatic lesions including 3 cm pelvic mass, fulguration of diaphragmatic lesions with argon, placement of bilateral tube thoracostomy tubes, intraoperative ultrasound   Central access: PICC consulted ordered  TPN start date: 02/12/24  Nutritional Goals: Goal TPN cycled rate is  62-123 mL/hr (provides 85 g of protein and 1652 kcals per day)  RD Assessment: Estimated Needs Total Energy Estimated Needs: 1600-1800  kcal Total Protein Estimated Needs: 80-100 gm Total Fluid Estimated Needs: >1.6L/day  Current Nutrition:  5/29 TPN + NPO 5/30 TPN + CLD 6/2 TPN + sips with meds 6/3: Soft diet -only 1/4 of a jello 6/4: ate half a french toast, half a Boost. No pain but wanting to go slow.   Plan:  Cycle goal TPN over 14 hrs at 62-123 ml/hr with 1hr cycle up and 1hr cycle down, (GIR 2.7-5.3), meeting 100% nutrition needs Electrolytes in TPN: increase Na 100 mEq/L, decrease K 20 mEq/L, Ca 5 mEq/L, Mg 8 mEq/L, and Phos 12 mmol/L. Cl:Ac 1:1 Continue standard MVI and trace elements to TPN  Stop SSI and check BG q12hr Monitor TPN labs on Mon/Thurs, daily as needed.     Dorene Gang, PharmD, BCPS, BCCP Clinical Pharmacist  Please check AMION for all [facility] phone numbers After 10:00 PM, call Main Pharmacy [phone]

## 2024-02-19 ENCOUNTER — Other Ambulatory Visit (HOSPITAL_COMMUNITY): Payer: Self-pay

## 2024-02-19 ENCOUNTER — Other Ambulatory Visit: Payer: Self-pay

## 2024-02-19 ENCOUNTER — Encounter: Payer: Self-pay | Admitting: Hematology and Oncology

## 2024-02-19 DIAGNOSIS — Z9089 Acquired absence of other organs: Secondary | ICD-10-CM | POA: Diagnosis not present

## 2024-02-19 DIAGNOSIS — R112 Nausea with vomiting, unspecified: Secondary | ICD-10-CM | POA: Diagnosis not present

## 2024-02-19 DIAGNOSIS — C49A3 Gastrointestinal stromal tumor of small intestine: Secondary | ICD-10-CM | POA: Diagnosis not present

## 2024-02-19 DIAGNOSIS — E639 Nutritional deficiency, unspecified: Secondary | ICD-10-CM | POA: Diagnosis not present

## 2024-02-19 LAB — COMPREHENSIVE METABOLIC PANEL WITH GFR
ALT: 18 U/L (ref 0–44)
AST: 25 U/L (ref 15–41)
Albumin: 2.4 g/dL — ABNORMAL LOW (ref 3.5–5.0)
Alkaline Phosphatase: 56 U/L (ref 38–126)
Anion gap: 8 (ref 5–15)
BUN: 19 mg/dL (ref 6–20)
CO2: 26 mmol/L (ref 22–32)
Calcium: 8.2 mg/dL — ABNORMAL LOW (ref 8.9–10.3)
Chloride: 102 mmol/L (ref 98–111)
Creatinine, Ser: 0.6 mg/dL (ref 0.44–1.00)
GFR, Estimated: >60 mL/min (ref >60)
Glucose, Bld: 127 mg/dL — ABNORMAL HIGH (ref 70–99)
Potassium: 4.1 mmol/L (ref 3.5–5.1)
Sodium: 136 mmol/L (ref 135–145)
Total Bilirubin: 0.3 mg/dL (ref 0.0–1.2)
Total Protein: 5.5 g/dL — ABNORMAL LOW (ref 6.5–8.1)

## 2024-02-19 LAB — PHOSPHORUS: Phosphorus: 3.6 mg/dL (ref 2.5–4.6)

## 2024-02-19 LAB — MAGNESIUM: Magnesium: 2.2 mg/dL (ref 1.7–2.4)

## 2024-02-19 LAB — GLUCOSE, CAPILLARY: Glucose-Capillary: 95 mg/dL (ref 70–99)

## 2024-02-19 NOTE — Progress Notes (Signed)
 PHARMACY - TOTAL PARENTERAL NUTRITION CONSULT NOTE   Indication: Intolerance to enteral nutrition  Patient Measurements: Height: 5\' 2"  (157.5 cm) Weight: 57.9 kg (127 lb 10.3 oz) IBW/kg (Calculated) : 50.1 TPN AdjBW (KG): 59.9 Body mass index is 23.35 kg/m. Usual Weight: 130  Assessment:  48 y.o. female with metastatic GI stromal tumor, s/p exploratory laparotomy, debulking of metastatic GI stromal tumors with splenectomy, antrectomy, partial right hepatectomy . Delayed gastric emptying and unable to tolerate enteral feeds. Patient has had persistent nausea and frequent vomiting for the last few days, and per surgery's discussions with the patient has not had significant PO intake since surgery, only occasional small amounts of solid food. Suspect delayed gastric emptying. Pharmacy consulted to initiate TPN.   5/31 patient reported less nausea/vomiting. Able to tolerate Boost Breeze. Continuing on CLD.   Glucose / Insulin : CBG < 150, used 2 units sSSI/24 hr Electrolytes: Na 135 down, CoCa 9.6, others wnl  Renal: Scr 0.38 (0.4-0.6), BUN WNL Hepatic: LFTs wnl, alb 2.4, TG 107 Intake / Output; MIVF: no output charted, LBM 6/4 scheduled metoclopramide  initiated 5/29 >  GI Imaging: 5/22:Multiple masses are identified in the pelvis measuring up to 6.3 x 5.0 cm GI Surgeries / Procedures:  5/15: Exploratory laparotomy: debulking of metastatic GI stromal tumors with splenectomy, antrectomy with billroth II anastamosis, partial right hepatectomy, resection of peritoneal metastatic lesions including 3 cm pelvic mass, fulguration of diaphragmatic lesions with argon, placement of bilateral tube thoracostomy tubes, intraoperative ultrasound   Central access: PICC consulted ordered  TPN start date: 02/12/24  Nutritional Goals: Goal TPN cycled rate is  62-123 mL/hr (provides 85 g of protein and 1652 kcals per day)  RD Assessment: Estimated Needs Total Energy Estimated Needs: 1600-1800  kcal Total Protein Estimated Needs: 80-100 gm Total Fluid Estimated Needs: >1.6L/day  Current Nutrition:  5/29 TPN + NPO 5/30 TPN + CLD 6/2 TPN + sips with meds 6/3: Soft diet -only 1/4 of a jello 6/4: ate half a french toast, half a Boost. No pain but wanting to go slow.   Plan:  Discharge today with cycled TPN by home infusion - will be hooked up to home TPN before leaving due to lack of home RN availability tonight    Dorene Gang, PharmD, BCPS, BCCP Clinical Pharmacist  Please check AMION for all [facility] phone numbers After 10:00 PM, call Main Pharmacy [phone]

## 2024-02-19 NOTE — Progress Notes (Signed)
 Patient shared that the ensure made her vomit. She declined for any antinausea meds at this time. Will continue to monitor.

## 2024-02-19 NOTE — TOC Transition Note (Signed)
 Transition of Care (TOC) - Discharge Note Sherin Dingwall RN,BSN Transitions of Care Unit 4NP (Non Trauma)- RN Case Manager See Treatment Team for direct Phone #   Patient Details  Name: Anne Horton MRN: [id number] Date of Birth: 1976-08-19  Transition of Care [facility]) CM/SW Contact:  Rox Cope, RN Phone Number: 02/19/2024, 2:28 PM   Clinical Narrative:    Pt stable for transition home today, CM has confirmed with home infusion pharmacy that home TPN can be set up for today- liaison Pam will meet with pt/family this afternoon between 3-4pm with plan to hook pt up to her home TPN at that time during bedside education.  Home TPN order has been signed by MD and faxed to [facility].   Per [facility] liaison- Pam- they will coordinate home nursing for TPN and PICC line needs- with a planned RN visit for tomorrow.   Husband to transport home, meds have been sent to [facility] outpt pharmacy.    Final next level of care: Home w Home Health Services Barriers to Discharge: Barriers Resolved   Patient Goals and CMS Choice Patient states their goals for this hospitalization and ongoing recovery are:: return home w/ family CMS Medicare.gov Compare Post Acute Care list provided to:: Patient Choice offered to / list presented to : Patient      Discharge Placement               Home w/ [facility]        Discharge Plan and Services Additional resources added to the After Visit Summary for     Discharge Planning Services: CM Consult Post Acute Care Choice: Home Health          DME Arranged: N/A DME Agency: NA       HH Arranged: TPN, RN HH Agency: [facility] Date HH Agency Contacted: 02/16/24 Time HH Agency Contacted: 1200 Representative spoke with at [facility] Agency: Pam  Social Drivers of Health (SDOH) Interventions SDOH Screenings   Food Insecurity: No Food Insecurity (02/03/2024)  Housing: Low Risk  (02/03/2024)  Transportation Needs: No Transportation Needs (02/03/2024)   Utilities: Not At Risk (02/03/2024)  Financial Resource Strain: Low Risk  (11/17/2020)   Received from [facility] visits prior to 11/16/2022., [facility] visits prior to 11/16/2022.  Physical Activity: Inactive (11/17/2020)   Received from [facility] visits prior to 11/16/2022., [facility] visits prior to 11/16/2022.  Social Connections: Moderately Isolated (11/17/2020)   Received from [facility] visits prior to 11/16/2022., [facility] visits prior to 11/16/2022.  Stress: Stress Concern Present (11/17/2020)   Received from [facility] visits prior to 11/16/2022., [facility] visits prior to 11/16/2022.  Tobacco Use: Low Risk  (01/29/2024)     Readmission Risk Interventions    02/19/2024    2:27 PM  Readmission Risk Prevention Plan  Transportation Screening Complete  PCP or Specialist Appt within 5-7 Days Complete  Home Care Screening Complete  Medication Review (RN CM) Complete

## 2024-02-19 NOTE — Plan of Care (Signed)

## 2024-02-19 NOTE — Plan of Care (Signed)
  Problem: Education: Goal: Knowledge of General Education information will improve Description: Including pain rating scale, medication(s)/side effects and non-pharmacologic comfort measures Outcome: Adequate for Discharge   

## 2024-02-20 DIAGNOSIS — Z9089 Acquired absence of other organs: Secondary | ICD-10-CM | POA: Diagnosis not present

## 2024-02-20 DIAGNOSIS — C49A3 Gastrointestinal stromal tumor of small intestine: Secondary | ICD-10-CM | POA: Diagnosis not present

## 2024-02-20 DIAGNOSIS — E639 Nutritional deficiency, unspecified: Secondary | ICD-10-CM | POA: Diagnosis not present

## 2024-02-23 ENCOUNTER — Other Ambulatory Visit: Payer: Self-pay

## 2024-02-24 ENCOUNTER — Other Ambulatory Visit (HOSPITAL_COMMUNITY): Payer: Self-pay

## 2024-02-26 DIAGNOSIS — Z9089 Acquired absence of other organs: Secondary | ICD-10-CM | POA: Diagnosis not present

## 2024-02-26 DIAGNOSIS — C49A3 Gastrointestinal stromal tumor of small intestine: Secondary | ICD-10-CM | POA: Diagnosis not present

## 2024-02-26 DIAGNOSIS — E639 Nutritional deficiency, unspecified: Secondary | ICD-10-CM | POA: Diagnosis not present

## 2024-02-27 ENCOUNTER — Other Ambulatory Visit: Payer: Self-pay | Admitting: General Surgery

## 2024-02-27 ENCOUNTER — Ambulatory Visit
Admission: RE | Admit: 2024-02-27 | Discharge: 2024-02-27 | Disposition: A | Discharge status: Home / Self Care | Source: Ambulatory Visit | Attending: General Surgery | Admitting: General Surgery

## 2024-02-27 DIAGNOSIS — J939 Pneumothorax, unspecified: Secondary | ICD-10-CM

## 2024-02-27 DIAGNOSIS — Z9089 Acquired absence of other organs: Secondary | ICD-10-CM | POA: Diagnosis not present

## 2024-02-27 DIAGNOSIS — E639 Nutritional deficiency, unspecified: Secondary | ICD-10-CM | POA: Diagnosis not present

## 2024-02-27 DIAGNOSIS — C49A3 Gastrointestinal stromal tumor of small intestine: Secondary | ICD-10-CM | POA: Diagnosis not present

## 2024-02-27 DIAGNOSIS — R112 Nausea with vomiting, unspecified: Secondary | ICD-10-CM | POA: Diagnosis not present

## 2024-02-27 DIAGNOSIS — Z09 Encounter for follow-up examination after completed treatment for conditions other than malignant neoplasm: Secondary | ICD-10-CM | POA: Diagnosis not present

## 2024-03-02 DIAGNOSIS — C49A3 Gastrointestinal stromal tumor of small intestine: Secondary | ICD-10-CM | POA: Diagnosis not present

## 2024-03-02 DIAGNOSIS — E639 Nutritional deficiency, unspecified: Secondary | ICD-10-CM | POA: Diagnosis not present

## 2024-03-02 DIAGNOSIS — Z9089 Acquired absence of other organs: Secondary | ICD-10-CM | POA: Diagnosis not present

## 2024-03-04 DIAGNOSIS — Z9089 Acquired absence of other organs: Secondary | ICD-10-CM | POA: Diagnosis not present

## 2024-03-04 DIAGNOSIS — E639 Nutritional deficiency, unspecified: Secondary | ICD-10-CM | POA: Diagnosis not present

## 2024-03-04 DIAGNOSIS — C49A3 Gastrointestinal stromal tumor of small intestine: Secondary | ICD-10-CM | POA: Diagnosis not present

## 2024-03-05 DIAGNOSIS — C49A3 Gastrointestinal stromal tumor of small intestine: Secondary | ICD-10-CM | POA: Diagnosis not present

## 2024-03-05 DIAGNOSIS — Z9089 Acquired absence of other organs: Secondary | ICD-10-CM | POA: Diagnosis not present

## 2024-03-05 DIAGNOSIS — E639 Nutritional deficiency, unspecified: Secondary | ICD-10-CM | POA: Diagnosis not present

## 2024-03-08 ENCOUNTER — Other Ambulatory Visit: Payer: Self-pay

## 2024-03-11 DIAGNOSIS — E639 Nutritional deficiency, unspecified: Secondary | ICD-10-CM | POA: Diagnosis not present

## 2024-03-11 DIAGNOSIS — C49A3 Gastrointestinal stromal tumor of small intestine: Secondary | ICD-10-CM | POA: Diagnosis not present

## 2024-03-11 DIAGNOSIS — Z9089 Acquired absence of other organs: Secondary | ICD-10-CM | POA: Diagnosis not present

## 2024-03-15 ENCOUNTER — Other Ambulatory Visit (HOSPITAL_COMMUNITY): Payer: Self-pay

## 2024-03-15 ENCOUNTER — Other Ambulatory Visit: Payer: Self-pay

## 2024-03-15 DIAGNOSIS — E639 Nutritional deficiency, unspecified: Secondary | ICD-10-CM | POA: Diagnosis not present

## 2024-03-15 DIAGNOSIS — C49A3 Gastrointestinal stromal tumor of small intestine: Secondary | ICD-10-CM | POA: Diagnosis not present

## 2024-03-15 DIAGNOSIS — Z9089 Acquired absence of other organs: Secondary | ICD-10-CM | POA: Diagnosis not present

## 2024-03-15 NOTE — Progress Notes (Signed)
 PROVIDER:  [name], MD Patient Care Team: Anne Joane BIRCH, NP as PCP - General Anne Callander, MD (Hematology and Oncology) Horton Anne CLAIR, MD as Consulting Provider (Surgical Oncology) Anne Deward BIRCH, MD (Hematology and Oncology)  MRN: [id number] DOB: 05/12/1976 DATE OF ENCOUNTER: 03/15/2024 Initial History:   Patient presented with stage I left breast cancer in 2019.  She had breast conserving therapy with Dr. Ebbie followed by low Oncotype score, radiation, and tamoxifen  therapy which she is taking for a total of 10 years.     I met her June 2022.  She was seen to have a 16 cm mass in her abdomen that was felt to likely be a GIST.  I explored her February 26, 2021 she was found to have a primary large GIST as well as multiple others.  This was not surprising given that she has neurofibromatosis 1 which makes her high risk for multifocal GIST tumors.     She had a relatively long small bowel resection with 28 tumors of varying sizes.  These all appeared to be in the jejunum.  She had 250 cm of remaining bowel.  There were several just that were not able to be resected due to the proximity to the ligament of Treitz.  The anastomosis was 10 cm beyond the ligament of Treitz.  Beyond the distal resection site there was another small 2 to 3 mm mass.  Final path was [id number], stage IIIB, high grade, mitotic rate 9/hpf.     She started Gleevec  which was planned to be continued for 3 years.  In April 2023 she had an episode of severe abdominal pain with nausea and vomiting that brought her to the emergency department.  She ended up seeing Dr. Dasie as I was out for medical leave.  She had some thickening at the jejunojejunostomy anastomosis as well as a small bowel short segment intussusception and a few small soft tissue nodules.  There was consideration for exploring her, but because her symptoms were improving she had a upper endoscopy with extension just past the ligament of Treitz.  She  was seen she did not have any evidence of recurrence at the jejunojejunostomy.  Her symptoms resolved.   Follow-up scan performed in June 2023  which showed no evidence of recurrence and resolved intussusception.  She did have a large right ovarian cyst.   Pt continued on gleevec  and tamoxifen  and had restaging studies 02/2023.  Unfortunately this did show a recurrent mass that was 8 cm.  She had MR to try to help distinguish this as bowel vs adnexa and it wasn't clear from that which it was.     I did a robotic resection of malignant pelvic mass with incidental appendectomy with Dr. Viktoria who did a lap LOA, robot TAH/BSO 04/09/23.  At that time there were numerous implants in the pelvis and sidewall.      She did have a trial of Sutent  which made her jaundiced within about 1 to 2 weeks.  She felt very ill.  This was discontinued.  She subsequently underwent CT abdomen pelvis at [facility] which was concerning for potential recurrence with a ill-defined soft tissue lesion near the liver as well as 1 near the spleen.  These were considered to be possible peritoneal implants.  The one near the liver was around 2.3 cm, but the one near the spleen was not given a measurement.  Patient had a CT chest which also demonstrated the perihepatic and perisplenic  lesions.  The perihepatic soft tissue lesion was 2.1 cm and the perisplenic 1 was 1.4 cm.  There was a concern for left internal mammary adenopathy as one of the nodes was 5 mm compared to 3 mm.     She has gotten opinions at [facility] and [facility].      She got a follow up scan around 1 month later that showed dramatic increase in size of the upper abdominal tumors and decision was made to proceed with surgery given that there were no systemic options    Interval History:   She underwent surgery Jan 29, 2024.  This was a debulking surgery which basically included a partial right hepatectomy, splenectomy, resection of peritoneal metastatic lesions including a 3  cm pelvic mass, antrectomy, resection of multiple peritoneal metastatic lesions, and due to involvement of the diaphragm on both sides, chest tubes were placed on both sides.  Fulguration of lesions on the diaphragm was performed with the argon as there were numerous lesions that were too large to be removed without leaving defects on the diaphragm.  Also, despite feeling a palpable abnormality, there was not a GIST in the antrum.    Postop course was complicated by persistent pneumothorax despite chest tubes and despite turning up to suction.  She did drop her right lung after her chest tube was removed on the right and had to have this replaced.  Additionally, she had difficulty advancing her diet and had to be discharged on TPN.    History of Present Illness Anne Horton is a 48 year old female who presents for evaluation of her PICC line and nutritional status. She is accompanied by her daughter.  She is experiencing issues with her PICC line, noting tenderness and bleeding at the insertion site.   Regarding her nutritional status, she is on Total Parenteral Nutrition (TPN) but reports an improvement in her oral intake. She mentions eating 'six hamburger helper' and 'pancakes,' and has progressed from eating half a peanut butter and jelly sandwich to a whole one. She feels she is eating 'half or three fourths' of her normal intake prior to surgery.  Her energy levels and movement have improved since her last visit. She has been able to drive short distances, although she does not feel ready for longer drives. She occasionally takes Tylenol , particularly in the morning, due to rib pain which she attributes to sleeping positions. The soreness in her abdomen is improving.  No fever is present, and her energy levels and movement have improved since the last visit.   Pathology 01/29/24 A. PERITONEAL, NODULE, EXCISION:  -  Fragments of metastatic/recurrent gastrointestinal stromal tumor  (history  of recurrent gastrointestinal stromal tumor noted).   B. PERISPLENIC TUMOR, RESECTION:  -  Fragmented spleen involved by metastatic gastrointestinal stromal  tumor.   C. STOMACH, ANTRUM, RESECTION:  -  Stomach with thickened muscularis propria, otherwise no significant  pathology.  - Negative for gastrointestinal stromal tumor.   D. LIVER, RIGHT POSTERIOR MASS, RESECTION:  -  Liver with metastatic gastrointestinal stromal tumor, margin  negative.    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 - Stop TPN by end of week. - Send home health order to stop TPN and remove PICC line. - Follow up with oncology this week.  Pneumothorax, unspecified No pneumothorax on x-ray. Soreness improved, likely post-surgical. Occasional Tylenol  for rib pain. - Continue Tylenol  as needed for pain relief.   Patient has follow-up with Dr. Marci at [facility].  The patient and husband are going to contact him to see if he would like to send any additional testing on the tumor tissue obtained. I am not sure if there is any other targeted therapy that could potentially be used. Patient does still have gross residual disease.  Hopefully we can get some form of targeted therapy to slow this down.      Return in about 4 weeks (around 04/12/2024).   The plan was discussed in detail with the patient today, who expressed understanding.  The patient has my contact information, and understands to call me with any additional questions or concerns in the interval.  I would be happy to see the patient back sooner if the need arises.   Anne CLAIR NEPHEW, MD

## 2024-03-16 DIAGNOSIS — E639 Nutritional deficiency, unspecified: Secondary | ICD-10-CM | POA: Diagnosis not present

## 2024-03-16 DIAGNOSIS — C49A3 Gastrointestinal stromal tumor of small intestine: Secondary | ICD-10-CM | POA: Diagnosis not present

## 2024-03-16 DIAGNOSIS — Z9089 Acquired absence of other organs: Secondary | ICD-10-CM | POA: Diagnosis not present

## 2024-03-16 DIAGNOSIS — R112 Nausea with vomiting, unspecified: Secondary | ICD-10-CM | POA: Diagnosis not present

## 2024-03-17 ENCOUNTER — Ambulatory Visit

## 2024-03-17 ENCOUNTER — Ambulatory Visit: Attending: Physician Assistant | Admitting: Physician Assistant

## 2024-03-17 ENCOUNTER — Other Ambulatory Visit (HOSPITAL_COMMUNITY): Payer: Self-pay

## 2024-03-17 ENCOUNTER — Encounter: Payer: Self-pay | Admitting: Physician Assistant

## 2024-03-17 ENCOUNTER — Other Ambulatory Visit: Payer: Self-pay

## 2024-03-17 VITALS — BP 110/84 | HR 93 | Ht 62.0 in | Wt 124.6 lb

## 2024-03-17 DIAGNOSIS — C50412 Malignant neoplasm of upper-outer quadrant of left female breast: Secondary | ICD-10-CM | POA: Diagnosis not present

## 2024-03-17 DIAGNOSIS — I480 Paroxysmal atrial fibrillation: Secondary | ICD-10-CM

## 2024-03-17 DIAGNOSIS — Z5111 Encounter for antineoplastic chemotherapy: Secondary | ICD-10-CM | POA: Diagnosis not present

## 2024-03-17 DIAGNOSIS — C499 Malignant neoplasm of connective and soft tissue, unspecified: Secondary | ICD-10-CM | POA: Diagnosis not present

## 2024-03-17 DIAGNOSIS — Z170 Estrogen receptor positive status [ER+]: Secondary | ICD-10-CM | POA: Diagnosis not present

## 2024-03-17 DIAGNOSIS — Q8500 Neurofibromatosis, unspecified: Secondary | ICD-10-CM | POA: Diagnosis not present

## 2024-03-17 DIAGNOSIS — Z803 Family history of malignant neoplasm of breast: Secondary | ICD-10-CM | POA: Diagnosis not present

## 2024-03-17 DIAGNOSIS — C49A3 Gastrointestinal stromal tumor of small intestine: Secondary | ICD-10-CM | POA: Diagnosis not present

## 2024-03-17 DIAGNOSIS — R000 Tachycardia, unspecified: Secondary | ICD-10-CM

## 2024-03-17 DIAGNOSIS — Z923 Personal history of irradiation: Secondary | ICD-10-CM | POA: Diagnosis not present

## 2024-03-17 MED ORDER — METOPROLOL SUCCINATE ER 50 MG PO TB24
50.0000 mg | ORAL_TABLET | Freq: Two times a day (BID) | ORAL | 2 refills | Status: DC
  Filled 2024-03-17 – 2024-04-05 (×2): qty 90, 45d supply, fill #0
  Filled 2024-05-23: qty 90, 45d supply, fill #1
  Filled 2024-07-08: qty 90, 45d supply, fill #2

## 2024-03-17 NOTE — Progress Notes (Signed)
 Cardiology Office Note   Date:  03/17/2024  ID:  Anne Horton, DOB June 29, 1976, MRN [id number] PCP: Marci Ethan LABOR, OT  [facility] Providers Cardiologist:  Aleene Passe, MD   -- will set up with Dr. Wonda whom seen the patient recently in the hospital  History of Present Illness Anne Horton is a 48 y.o. female with past medical history of hypertension, sinus tachycardia and neurofibromatosis.  Patient was previously placed on metoprolol  succinate for elevated heart rate and high blood pressure.  She underwent lumpectomy of the left breast for breast cancer.  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  He underwent surgery and chemotherapy.  Coronary calcium  score test obtained in February 2025 showed a coronary calcium  score of 0, 6 mm nodule in the right lower lobe of the lung, stable hypodense lesion in the posterior aspect of the right lobe of the liver.  She had CT of the abdomen in April that showed significant interval enlargement of the right hepatic and splenic lesion consistent with very aggressive metastatic GIST tumor.  Multiple enhancing soft tissue lesion in the pelvis consistent with progressive recurrent tumor.  New right millimeter omental nodule in the right abdomen and an enlarged right external iliac nodes.  4.9 x 2.3 x 3.5 cm gastric wall thickening consistent with GIST.  Patient eventually underwent exploratory laparotomy and debulking of the metastatic GI stromal tumors with splenectomy, antrectomy, partial right hepatectomy, resection of the peritoneal metastatic lesions, fulgurations of the diaphragmatic lesion with argon.  Postprocedure patient had right pneumothorax treated with chest tube.  Cardiology service consulted for postop atrial fibrillation.  Patient quickly converted back to sinus rhythm, she was not a candidate for anticoagulation therapy.  Metoprolol  succinate was increased in dosage.  Echocardiogram showed EF of  70 to 75%, no significant valvular issue.  The plan is to consider a 14-day ZIO monitor once she recovers from her surgery to watch for recurrent atrial fibrillation.  Per surgery, patient had persistent white blood cell count, CT of abdomen and pelvis was repeated which showed no evidence of abscess, however the pelvic mass that was not reachable from upper abdomen appears to be getting larger on the scan.  Patient presents today for cardiology follow-up accompanied by husband.  Her breathing is stable.  She denies any chest pain or shortness of breath.  She has no cardiac awareness of the atrial fibrillation.  EKG today shows sinus rhythm.  I will order a 2-week heart monitor and see her back in 6 weeks.  Will hold off on anticoagulation therapy unless there is recurrence of A-fib on the heart monitor.  After that, she can establish with Dr. Wonda she met recently.  ROS:   She denies chest pain, palpitations, dyspnea, pnd, orthopnea, n, v, dizziness, syncope, edema, weight gain, or early satiety. All other systems reviewed and are otherwise negative except as noted above.    Studies Reviewed      Cardiac Studies & Procedures   ______________________________________________________________________________________________     ECHOCARDIOGRAM  ECHOCARDIOGRAM COMPLETE 02/05/2024  Narrative ECHOCARDIOGRAM REPORT    [name] [facility] Date of Exam: 02/05/2024 Medical Rec #:  [id number]          Height:       62.0 in Accession #:    [id number]         Weight:       132.0 lb Date of Birth:  Jun 29, 1976  BSA:          1.602 m Patient Age:    48 years           BP:           144/65 mmHg Patient Gender: F                  HR:           119 bpm. Exam Location:  Inpatient  Procedure: 2D Echo, Cardiac Doppler and Color Doppler (Both Spectral and Color Flow Doppler were utilized during procedure).  Indications:    Atrial fibrillation  History:        Patient has no prior  history of Echocardiogram examinations.  Sonographer:    Philomena Daring Referring Phys: WADDELL LABOR PARCELLS  IMPRESSIONS   1. Left ventricular ejection fraction, by estimation, is 70 to 75%. The left ventricle has hyperdynamic function. The left ventricle has no regional wall motion abnormalities. There is mild left ventricular hypertrophy. Left ventricular diastolic parameters were normal. 2. Right ventricular systolic function is normal. The right ventricular size is normal. 3. A small pericardial effusion is present. 4. The mitral valve is normal in structure. No evidence of mitral valve regurgitation. No evidence of mitral stenosis. 5. The aortic valve is normal in structure. Aortic valve regurgitation is not visualized. No aortic stenosis is present. 6. The inferior vena cava is normal in size with greater than 50% respiratory variability, suggesting right atrial pressure of 3 mmHg.  FINDINGS Left Ventricle: Left ventricular ejection fraction, by estimation, is 70 to 75%. The left ventricle has hyperdynamic function. The left ventricle has no regional wall motion abnormalities. The left ventricular internal cavity size was normal in size. There is mild left ventricular hypertrophy. Left ventricular diastolic parameters were normal.  Right Ventricle: The right ventricular size is normal. No increase in right ventricular wall thickness. Right ventricular systolic function is normal.  Left Atrium: Left atrial size was normal in size.  Right Atrium: Right atrial size was normal in size.  Pericardium: A small pericardial effusion is present.  Mitral Valve: The mitral valve is normal in structure. No evidence of mitral valve regurgitation. No evidence of mitral valve stenosis.  Tricuspid Valve: The tricuspid valve is normal in structure. Tricuspid valve regurgitation is not demonstrated. No evidence of tricuspid stenosis.  Aortic Valve: The aortic valve is normal in structure. Aortic valve  regurgitation is not visualized. No aortic stenosis is present. Aortic valve mean gradient measures 6.0 mmHg. Aortic valve peak gradient measures 12.2 mmHg. Aortic valve area, by VTI measures 2.80 cm.  Pulmonic Valve: The pulmonic valve was normal in structure. Pulmonic valve regurgitation is not visualized. No evidence of pulmonic stenosis.  Aorta: The aortic root is normal in size and structure.  Venous: The inferior vena cava is normal in size with greater than 50% respiratory variability, suggesting right atrial pressure of 3 mmHg.  IAS/Shunts: No atrial level shunt detected by color flow Doppler.   LEFT VENTRICLE PLAX 2D LVIDd:         3.40 cm   Diastology LVIDs:         1.90 cm   LV e' medial:    7.18 cm/s LV PW:         1.10 cm   LV E/e' medial:  11.7 LV IVS:        1.20 cm   LV e' lateral:   12.00 cm/s LVOT diam:     2.00 cm  LV E/e' lateral: 7.0 LV SV:         67 LV SV Index:   42 LVOT Area:     3.14 cm   RIGHT VENTRICLE RV S prime:     20.60 cm/s TAPSE (M-mode): 1.6 cm  LEFT ATRIUM             Index        RIGHT ATRIUM          Index LA diam:        2.60 cm 1.62 cm/m   RA Area:     8.15 cm LA Vol (A2C):   15.6 ml 9.74 ml/m   RA Volume:   13.60 ml 8.49 ml/m LA Vol (A4C):   21.4 ml 13.36 ml/m LA Biplane Vol: 19.0 ml 11.86 ml/m AORTIC VALVE AV Area (Vmax):    2.76 cm AV Area (Vmean):   2.84 cm AV Area (VTI):     2.80 cm AV Vmax:           175.00 cm/s AV Vmean:          116.000 cm/s AV VTI:            0.239 m AV Peak Grad:      12.2 mmHg AV Mean Grad:      6.0 mmHg LVOT Vmax:         154.00 cm/s LVOT Vmean:        105.000 cm/s LVOT VTI:          0.213 m LVOT/AV VTI ratio: 0.89  AORTA Ao Root diam: 2.20 cm Ao Asc diam:  2.30 cm  MITRAL VALVE MV Area (PHT): 5.13 cm     SHUNTS MV Decel Time: 148 msec     Systemic VTI:  0.21 m MV E velocity: 83.80 cm/s   Systemic Diam: 2.00 cm MV A velocity: 111.00 cm/s MV E/A ratio:  0.75  Aditya  Sabharwal Electronically signed by Ria Commander Signature Date/Time: 02/05/2024/10:58:50 AM    Final      CT SCANS  CT CARDIAC SCORING (SELF PAY ONLY) 12/09/2023  Addendum 12/11/2023 10:08 PM ADDENDUM REPORT: 12/11/2023 22:05  EXAM: OVER-READ INTERPRETATION  CT CHEST  The following report is an over-read performed by radiologist Dr. Oneil Devonshire of [facility] on 12/11/2023. This over-read does not include interpretation of cardiac or coronary anatomy or pathology. The coronary calcium  score interpretation by the cardiologist is attached.  COMPARISON:  09/16/2023  FINDINGS: Cardiovascular: There are no significant extracardiac vascular findings.  Mediastinum/Nodes: There are no enlarged lymph nodes within the visualized mediastinum.  Lungs/Pleura: There is no pleural effusion. Lungs are well aerated bilaterally. A stable 6 mm nodule in the right lower lobe is noted best seen on image number 22 of series 4. No new nodules are noted.  Upper abdomen: Upper abdomen again demonstrates a hypodense lesion in the posterior aspect of the right lobe of the liver. This measures approximately 6 cm and greatest dimension has previously evaluated on multiple CTs of the abdomen pelvis.  Musculoskeletal/Chest wall: No chest wall mass or suspicious osseous findings within the visualized chest.  IMPRESSION: Stable 6 mm nodule in the right lower lobe.  Stable hypodense lesion in the posterior aspect of the right lobe of the liver incompletely evaluated on this noncontrast study but previously delineated on multiple exams.   Electronically Signed By: Oneil Devonshire M.D. On: 12/11/2023 22:05  Narrative CLINICAL DATA:  Cardiovascular Disease Risk stratification  EXAM: Coronary Calcium  Score  TECHNIQUE: A gated, non-contrast computed tomography scan of the heart was performed using 3mm slice thickness. Axial images were analyzed on a dedicated workstation.  Calcium  scoring of the coronary arteries was performed using the Agatston method.  FINDINGS: Coronary arteries: Normal origins.  Coronary Calcium  Score:  Left main: 0  Left anterior descending artery: 0  Left circumflex artery: 0  Right coronary artery: 0  Total: 0  Percentile: 0  Pericardium: Normal.  Aorta: Normal caliber of ascending aorta. No aortic atherosclerosis noted.  Non-cardiac: See separate report from [facility].  IMPRESSION: Coronary calcium  score of 0. This was 0 percentile for age-, race-, and sex-matched controls.  RECOMMENDATIONS: Coronary artery calcium  (CAC) score is a strong predictor of incident coronary heart disease (CHD) and provides predictive information beyond traditional risk factors. CAC scoring is reasonable to use in the decision to withhold, postpone, or initiate statin therapy in intermediate-risk or selected borderline-risk asymptomatic adults (age 26-75 years and LDL-C >=70 to <190 mg/dL) who do not have diabetes or established atherosclerotic cardiovascular disease (ASCVD).* In intermediate-risk (10-year ASCVD risk >=7.5% to <20%) adults or selected borderline-risk (10-year ASCVD risk >=5% to <7.5%) adults in whom a CAC score is measured for the purpose of making a treatment decision the following recommendations have been made:  If CAC=0, it is reasonable to withhold statin therapy and reassess in 5 to 10 years, as long as higher risk conditions are absent (diabetes mellitus, family history of premature CHD in first degree relatives (males <55 years; females <65 years), cigarette smoking, or LDL >=190 mg/dL).  If CAC is 1 to 99, it is reasonable to initiate statin therapy for patients >=71 years of age.  If CAC is >=100 or >=75th percentile, it is reasonable to initiate statin therapy at any age.  Cardiology referral should be considered for patients with CAC scores >=400 or >=75th percentile.  *2018  AHA/ACC/AACVPR/AAPA/ABC/ACPM/ADA/AGS/APhA/ASPC/NLA/PCNA Guideline on the Management of Blood Cholesterol: A Report of the American College of Cardiology/American Heart Association Task Force on Clinical Practice Guidelines. J Am Coll Cardiol. 2019;73(24):3168-3209.  Shelda Bruckner, MD  Electronically Signed: By: Shelda Bruckner M.D. On: 12/09/2023 16:41     ______________________________________________________________________________________________      Risk Assessment/Calculations  CHA2DS2-VASc Score = 2   This indicates a 2.2% annual risk of stroke. The patient's score is based upon: CHF History: 0 HTN History: 1 Diabetes History: 0 Stroke History: 0 Vascular Disease History: 0 Age Score: 0 Gender Score: 1             Physical Exam VS:  BP 110/84   Pulse 93   Ht 5' 2 (1.575 m)   Wt 124 lb 9.6 oz (56.5 kg)   LMP  (LMP Unknown) Comment: no chance per pt  SpO2 98%   BMI 22.79 kg/m        Wt Readings from Last 3 Encounters:  03/17/24 124 lb 9.6 oz (56.5 kg)  02/19/24 127 lb 10.3 oz (57.9 kg)  01/20/24 136 lb 11.2 oz (62 kg)    GEN: Well nourished, well developed in no acute distress NECK: No JVD; No carotid bruits CARDIAC: RRR, no murmurs, rubs, gallops RESPIRATORY:  Clear to auscultation without rales, wheezing or rhonchi  ABDOMEN: Soft, non-tender, non-distended EXTREMITIES:  No edema; No deformity   ASSESSMENT AND PLAN  Postop atrial fibrillation: Recently had atrial fibrillation briefly after her surgery.  She converted back to sinus rhythm on rate control therapy.  She is holding sinus rhythm today based on EKG.  As recommended in the hospital, we will proceed with 2-week heart monitor to assess A-fib burden.  She will follow-up in 6 weeks to review heart monitor.       Dispo: Follow-up with me in 6 weeks.  Signed, Scot Ford, PA

## 2024-03-17 NOTE — Progress Notes (Unsigned)
 Applied a 14 day Zio XT monitor to patient in the office  Excell Seltzer to read

## 2024-03-17 NOTE — Patient Instructions (Addendum)
 Medication Instructions:  NO CHANGES *If you need a refill on your cardiac medications before your next appointment, please call your pharmacy*  Lab Work: NO LABS If you have labs (blood work) drawn today and your tests are completely normal, you will receive your results only by: MyChart Message (if you have MyChart) OR A paper copy in the mail If you have any lab test that is abnormal or we need to change your treatment, we will call you to review the results.  Testing/Procedures: GEOFFRY HEWS- Long Term Monitor Instructions  Your physician has requested you wear a ZIO patch monitor for 14 days.  This is a single patch monitor. [employer] supplies one patch monitor per enrollment. Additional stickers are not available. Please do not apply patch if you will be having a Nuclear Stress Test,  Echocardiogram, Cardiac CT, MRI, or Chest Xray during the period you would be wearing the  monitor. The patch cannot be worn during these tests. You cannot remove and re-apply the  ZIO XT patch monitor.  Your ZIO patch monitor will be mailed 3 day USPS to your address on file. It may take 3-5 days  to receive your monitor after you have been enrolled.  Once you have received your monitor, please review the enclosed instructions. Your monitor  has already been registered assigning a specific monitor serial # to you.  Billing and Patient Assistance Program Information  We have supplied [employer] with any of your insurance information on file for billing purposes. [employer] offers a sliding scale Patient Assistance Program for patients that do not have  insurance, or whose insurance does not completely cover the cost of the ZIO monitor.  You must apply for the Patient Assistance Program to qualify for this discounted rate.  To apply, please call [employer] at [phone], select option 4, select option 2, ask to apply for  Patient Assistance Program. Meredeth will ask your household income, and how many people  are  in your household. They will quote your out-of-pocket cost based on that information.  [employer] will also be able to set up a 8-month, interest-free payment plan if needed.  Applying the monitor   Shave hair from upper left chest.  Hold abrader disc by orange tab. Rub abrader in 40 strokes over the upper left chest as  indicated in your monitor instructions.  Clean area with 4 enclosed alcohol pads. Let dry.  Apply patch as indicated in monitor instructions. Patch will be placed under collarbone on left  side of chest with arrow pointing upward.  Rub patch adhesive wings for 2 minutes. Remove white label marked 1. Remove the white  label marked 2. Rub patch adhesive wings for 2 additional minutes.  While looking in a mirror, press and release button in center of patch. A small green light will  flash 3-4 times. This will be your only indicator that the monitor has been turned on.  Do not shower for the first 24 hours. You may shower after the first 24 hours.  Press the button if you feel a symptom. You will hear a small click. Record Date, Time and  Symptom in the Patient Logbook.  When you are ready to remove the patch, follow instructions on the last 2 pages of Patient  Logbook. Stick patch monitor onto the last page of Patient Logbook.  Place Patient Logbook in the blue and white box. Use locking tab on box and tape box closed  securely. The blue and white box has prepaid  postage on it. Please place it in the mailbox as  soon as possible. Your physician should have your test results approximately 7 days after the  monitor has been mailed back to [facility] [place].  Call [facility] Customer Care at [phone] if you have questions regarding  your ZIO XT patch monitor. Call them immediately if you see an orange light blinking on your  monitor.  If your monitor falls off in less than 4 days, contact our Monitor department at [phone].  If your monitor becomes loose or falls  off after 4 days call [employer] at [phone] for  suggestions on securing your monitor   Follow-Up: At [facility], you and your health needs are our priority.  As part of our continuing mission to provide you with exceptional heart care, our providers are all part of one team.  This team includes your primary Cardiologist (physician) and Advanced Practice Providers or APPs (Physician Assistants and Nurse Practitioners) who all work together to provide you with the care you need, when you need it.  Your next appointment:   6 week(s)  Provider:   Scot Ford, PA-C

## 2024-03-18 ENCOUNTER — Other Ambulatory Visit: Payer: Self-pay

## 2024-03-18 NOTE — Progress Notes (Signed)
 Per office visit on 01/26/24, Gleevec  was discontinued due to inefficacy. Disenrolled.

## 2024-03-22 ENCOUNTER — Other Ambulatory Visit (HOSPITAL_COMMUNITY): Payer: Self-pay | Admitting: *Deleted

## 2024-03-22 DIAGNOSIS — C499 Malignant neoplasm of connective and soft tissue, unspecified: Secondary | ICD-10-CM

## 2024-03-22 DIAGNOSIS — C50412 Malignant neoplasm of upper-outer quadrant of left female breast: Secondary | ICD-10-CM

## 2024-03-22 DIAGNOSIS — Z803 Family history of malignant neoplasm of breast: Secondary | ICD-10-CM

## 2024-03-22 DIAGNOSIS — C49A3 Gastrointestinal stromal tumor of small intestine: Secondary | ICD-10-CM

## 2024-03-22 DIAGNOSIS — Z923 Personal history of irradiation: Secondary | ICD-10-CM

## 2024-03-22 DIAGNOSIS — Q8500 Neurofibromatosis, unspecified: Secondary | ICD-10-CM

## 2024-03-31 ENCOUNTER — Ambulatory Visit (HOSPITAL_COMMUNITY)
Admission: RE | Admit: 2024-03-31 | Discharge: 2024-03-31 | Disposition: A | Discharge status: Home / Self Care | Source: Ambulatory Visit | Attending: Hematology and Oncology | Admitting: Hematology and Oncology

## 2024-03-31 DIAGNOSIS — Q8500 Neurofibromatosis, unspecified: Secondary | ICD-10-CM | POA: Insufficient documentation

## 2024-03-31 DIAGNOSIS — Z923 Personal history of irradiation: Secondary | ICD-10-CM | POA: Diagnosis not present

## 2024-03-31 DIAGNOSIS — C50412 Malignant neoplasm of upper-outer quadrant of left female breast: Secondary | ICD-10-CM | POA: Diagnosis not present

## 2024-03-31 DIAGNOSIS — Z803 Family history of malignant neoplasm of breast: Secondary | ICD-10-CM | POA: Insufficient documentation

## 2024-03-31 DIAGNOSIS — C49A3 Gastrointestinal stromal tumor of small intestine: Secondary | ICD-10-CM | POA: Diagnosis not present

## 2024-03-31 DIAGNOSIS — C499 Malignant neoplasm of connective and soft tissue, unspecified: Secondary | ICD-10-CM | POA: Insufficient documentation

## 2024-03-31 DIAGNOSIS — Z9081 Acquired absence of spleen: Secondary | ICD-10-CM | POA: Diagnosis not present

## 2024-03-31 DIAGNOSIS — R222 Localized swelling, mass and lump, trunk: Secondary | ICD-10-CM | POA: Diagnosis not present

## 2024-03-31 DIAGNOSIS — Z90710 Acquired absence of both cervix and uterus: Secondary | ICD-10-CM | POA: Diagnosis not present

## 2024-03-31 DIAGNOSIS — Z170 Estrogen receptor positive status [ER+]: Secondary | ICD-10-CM | POA: Diagnosis not present

## 2024-03-31 MED ORDER — IOHEXOL 300 MG/ML  SOLN
100.0000 mL | Freq: Once | INTRAMUSCULAR | Status: AC | PRN
  Administered 2024-03-31: 80 mL via INTRAVENOUS

## 2024-04-05 ENCOUNTER — Other Ambulatory Visit (HOSPITAL_COMMUNITY): Payer: Self-pay

## 2024-04-09 DIAGNOSIS — R000 Tachycardia, unspecified: Secondary | ICD-10-CM | POA: Diagnosis not present

## 2024-04-13 ENCOUNTER — Ambulatory Visit
Admission: RE | Admit: 2024-04-13 | Discharge: 2024-04-13 | Disposition: A | Discharge status: Home / Self Care | Source: Ambulatory Visit | Attending: Hematology | Admitting: Hematology

## 2024-04-13 ENCOUNTER — Other Ambulatory Visit: Payer: Self-pay | Admitting: Hematology

## 2024-04-13 DIAGNOSIS — N641 Fat necrosis of breast: Secondary | ICD-10-CM | POA: Diagnosis not present

## 2024-04-13 DIAGNOSIS — Z1231 Encounter for screening mammogram for malignant neoplasm of breast: Secondary | ICD-10-CM

## 2024-04-13 DIAGNOSIS — N6310 Unspecified lump in the right breast, unspecified quadrant: Secondary | ICD-10-CM

## 2024-04-13 DIAGNOSIS — N6311 Unspecified lump in the right breast, upper outer quadrant: Secondary | ICD-10-CM | POA: Diagnosis not present

## 2024-04-13 DIAGNOSIS — N6002 Solitary cyst of left breast: Secondary | ICD-10-CM | POA: Diagnosis not present

## 2024-04-13 MED ORDER — IOPAMIDOL (ISOVUE-370) INJECTION 76%
100.0000 mL | Freq: Once | INTRAVENOUS | Status: AC | PRN
  Administered 2024-04-13: 100 mL via INTRAVENOUS

## 2024-04-14 ENCOUNTER — Encounter: Payer: Self-pay | Admitting: Hematology

## 2024-04-14 DIAGNOSIS — Z923 Personal history of irradiation: Secondary | ICD-10-CM | POA: Diagnosis not present

## 2024-04-14 DIAGNOSIS — C499 Malignant neoplasm of connective and soft tissue, unspecified: Secondary | ICD-10-CM | POA: Diagnosis not present

## 2024-04-14 DIAGNOSIS — C49A3 Gastrointestinal stromal tumor of small intestine: Secondary | ICD-10-CM | POA: Diagnosis not present

## 2024-04-14 DIAGNOSIS — Q8500 Neurofibromatosis, unspecified: Secondary | ICD-10-CM | POA: Diagnosis not present

## 2024-04-14 DIAGNOSIS — Z170 Estrogen receptor positive status [ER+]: Secondary | ICD-10-CM | POA: Diagnosis not present

## 2024-04-14 DIAGNOSIS — C50412 Malignant neoplasm of upper-outer quadrant of left female breast: Secondary | ICD-10-CM | POA: Diagnosis not present

## 2024-04-14 DIAGNOSIS — Z803 Family history of malignant neoplasm of breast: Secondary | ICD-10-CM | POA: Diagnosis not present

## 2024-04-16 DIAGNOSIS — Z0000 Encounter for general adult medical examination without abnormal findings: Secondary | ICD-10-CM | POA: Diagnosis not present

## 2024-04-19 ENCOUNTER — Other Ambulatory Visit: Payer: Self-pay

## 2024-04-19 ENCOUNTER — Encounter: Payer: Self-pay | Admitting: Hematology

## 2024-04-20 DIAGNOSIS — I340 Nonrheumatic mitral (valve) insufficiency: Secondary | ICD-10-CM | POA: Diagnosis not present

## 2024-04-20 DIAGNOSIS — Q8500 Neurofibromatosis, unspecified: Secondary | ICD-10-CM | POA: Diagnosis not present

## 2024-04-20 DIAGNOSIS — C49A3 Gastrointestinal stromal tumor of small intestine: Secondary | ICD-10-CM | POA: Diagnosis not present

## 2024-04-20 DIAGNOSIS — E785 Hyperlipidemia, unspecified: Secondary | ICD-10-CM | POA: Diagnosis not present

## 2024-04-20 DIAGNOSIS — R7301 Impaired fasting glucose: Secondary | ICD-10-CM | POA: Diagnosis not present

## 2024-04-20 DIAGNOSIS — Z0000 Encounter for general adult medical examination without abnormal findings: Secondary | ICD-10-CM | POA: Diagnosis not present

## 2024-04-20 DIAGNOSIS — F411 Generalized anxiety disorder: Secondary | ICD-10-CM | POA: Diagnosis not present

## 2024-04-20 DIAGNOSIS — Z8669 Personal history of other diseases of the nervous system and sense organs: Secondary | ICD-10-CM | POA: Diagnosis not present

## 2024-04-20 DIAGNOSIS — J309 Allergic rhinitis, unspecified: Secondary | ICD-10-CM | POA: Diagnosis not present

## 2024-04-20 DIAGNOSIS — F330 Major depressive disorder, recurrent, mild: Secondary | ICD-10-CM | POA: Diagnosis not present

## 2024-04-20 DIAGNOSIS — R000 Tachycardia, unspecified: Secondary | ICD-10-CM | POA: Diagnosis not present

## 2024-04-27 ENCOUNTER — Ambulatory Visit: Payer: Self-pay | Admitting: Physician Assistant

## 2024-04-27 DIAGNOSIS — R000 Tachycardia, unspecified: Secondary | ICD-10-CM

## 2024-04-28 ENCOUNTER — Encounter: Payer: Self-pay | Admitting: Physician Assistant

## 2024-04-28 ENCOUNTER — Ambulatory Visit: Attending: Cardiovascular Disease | Admitting: Physician Assistant

## 2024-04-28 VITALS — BP 114/70 | HR 70 | Resp 16 | Ht 62.0 in | Wt 118.0 lb

## 2024-04-28 DIAGNOSIS — I10 Essential (primary) hypertension: Secondary | ICD-10-CM

## 2024-04-28 DIAGNOSIS — C49A3 Gastrointestinal stromal tumor of small intestine: Secondary | ICD-10-CM | POA: Diagnosis not present

## 2024-04-28 DIAGNOSIS — I480 Paroxysmal atrial fibrillation: Secondary | ICD-10-CM

## 2024-04-28 NOTE — Patient Instructions (Signed)
 Medication Instructions:  No changes *If you need a refill on your cardiac medications before your next appointment, please call your pharmacy*  Lab Work: No labs  Testing/Procedures: No testing  Follow-Up: At [facility], you and your health needs are our priority.  As part of our continuing mission to provide you with exceptional heart care, our providers are all part of one team.  This team includes your primary Cardiologist (physician) and Advanced Practice Providers or APPs (Physician Assistants and Nurse Practitioners) who all work together to provide you with the care you need, when you need it.  Your next appointment:   4 month(s)  Provider:   Ozell Fell, MD    We recommend signing up for the patient portal called MyChart.  Sign up information is provided on this After Visit Summary.  MyChart is used to connect with patients for Virtual Visits (Telemedicine).  Patients are able to view lab/test results, encounter notes, upcoming appointments, etc.  Non-urgent messages can be sent to your provider as well.   To learn more about what you can do with MyChart, go to [email or web address].

## 2024-04-28 NOTE — Progress Notes (Signed)
 Cardiology Office Note   Date:  04/28/2024  ID:  DIA DONATE, DOB 21-Oct-1975, MRN [id number] PCP: [facility] Providers Cardiologist:  Ozell Fell, MD     History of Present Illness Anne Horton is a 48 y.o. female with past medical history of hypertension, sinus tachycardia, post op afib and neurofibromatosis.  Patient was previously placed on metoprolol  succinate for elevated heart rate and high blood pressure.  She underwent left lumpectomy for breast cancer.  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  He underwent surgery and chemotherapy.  Coronary calcium  score test obtained in February 2025 showed a coronary calcium  score of 0, 6 mm nodule in the right lower lobe of the lung, stable hypodense lesion in the posterior aspect of the right lobe of the liver.  She had CT of the abdomen in April that showed significant interval enlargement of the right hepatic and splenic lesion consistent with very aggressive metastatic GIST tumor.  Multiple enhancing soft tissue lesion in the pelvis consistent with progressive recurrent tumor.  New 9 mm omental nodule in the right abdomen and an enlarged right external iliac nodes.  4.9 x 2.3 x 3.5 cm gastric wall thickening consistent with GIST.  Patient eventually underwent exploratory laparotomy and debulking of the metastatic GI stromal tumors with splenectomy, antrectomy, partial right hepatectomy, resection of the peritoneal metastatic lesions, fulgurations of the diaphragmatic lesion with argon.  Postprocedure patient had right pneumothorax treated with chest tube.  Cardiology service consulted for postop atrial fibrillation.  Patient quickly converted back to sinus rhythm, she was not a candidate for anticoagulation therapy.  Metoprolol  succinate was increased in dosage.  Echocardiogram showed EF of 70 to 75%, no significant valvular issue.  The plan is to consider a 14-day ZIO  monitor once she recovers from her surgery to watch for recurrent atrial fibrillation.  Per surgery, patient had persistent white blood cell count, CT of abdomen and pelvis was repeated which showed no evidence of abscess, however the pelvic mass that was not reachable from upper abdomen appears to be getting larger on the scan.  Patient presents today for follow-up.  I reviewed her recent heart monitor, there was no recurrence of A-fib.  She had 2 episode of transient SVT that is benign.  She had 2 episode of sinus tach while talking and taking a shower, this all occurred on the same day.  She did not go out of rhythm and maintained sinus rhythm despite having a heart rate in the 140s.  This is likely related to deconditioning.  Patient presents today for follow-up.  She denies any recent palpitation, chest pain or significant shortness of breath.  She has no lower extremity edema.  Heart rate is quite regular on exam holding her heart rate in the 70s to 80s.  She is on metoprolol  succinate 50 mg twice a day.  I recommend to continue on the current therapy.  She can follow-up with Dr. Fell in 4 months.  If she does have recurrence of tachycardia episode, may be good candidate to consider Kardia mobile device of Apple Watch.   ROS:   She denies chest pain, palpitations, dyspnea, pnd, orthopnea, n, v, dizziness, syncope, edema, weight gain, or early satiety. All other systems reviewed and are otherwise negative except as noted above.    Studies Reviewed      Cardiac Studies & Procedures   ______________________________________________________________________________________________     ECHOCARDIOGRAM  ECHOCARDIOGRAM COMPLETE  02/05/2024  Narrative ECHOCARDIOGRAM REPORT    [name] [facility] Date of Exam: 02/05/2024 Medical Rec #:  [id number]          Height:       62.0 in Accession #:    [id number]         Weight:       132.0 lb Date of Birth:  09-16-1976           BSA:           1.602 m Patient Age:    48 years           BP:           144/65 mmHg Patient Gender: F                  HR:           119 bpm. Exam Location:  Inpatient  Procedure: 2D Echo, Cardiac Doppler and Color Doppler (Both Spectral and Color Flow Doppler were utilized during procedure).  Indications:    Atrial fibrillation  History:        Patient has no prior history of Echocardiogram examinations.  Sonographer:    Philomena Daring Referring Phys: WADDELL LABOR PARCELLS  IMPRESSIONS   1. Left ventricular ejection fraction, by estimation, is 70 to 75%. The left ventricle has hyperdynamic function. The left ventricle has no regional wall motion abnormalities. There is mild left ventricular hypertrophy. Left ventricular diastolic parameters were normal. 2. Right ventricular systolic function is normal. The right ventricular size is normal. 3. A small pericardial effusion is present. 4. The mitral valve is normal in structure. No evidence of mitral valve regurgitation. No evidence of mitral stenosis. 5. The aortic valve is normal in structure. Aortic valve regurgitation is not visualized. No aortic stenosis is present. 6. The inferior vena cava is normal in size with greater than 50% respiratory variability, suggesting right atrial pressure of 3 mmHg.  FINDINGS Left Ventricle: Left ventricular ejection fraction, by estimation, is 70 to 75%. The left ventricle has hyperdynamic function. The left ventricle has no regional wall motion abnormalities. The left ventricular internal cavity size was normal in size. There is mild left ventricular hypertrophy. Left ventricular diastolic parameters were normal.  Right Ventricle: The right ventricular size is normal. No increase in right ventricular wall thickness. Right ventricular systolic function is normal.  Left Atrium: Left atrial size was normal in size.  Right Atrium: Right atrial size was normal in size.  Pericardium: A small pericardial effusion is  present.  Mitral Valve: The mitral valve is normal in structure. No evidence of mitral valve regurgitation. No evidence of mitral valve stenosis.  Tricuspid Valve: The tricuspid valve is normal in structure. Tricuspid valve regurgitation is not demonstrated. No evidence of tricuspid stenosis.  Aortic Valve: The aortic valve is normal in structure. Aortic valve regurgitation is not visualized. No aortic stenosis is present. Aortic valve mean gradient measures 6.0 mmHg. Aortic valve peak gradient measures 12.2 mmHg. Aortic valve area, by VTI measures 2.80 cm.  Pulmonic Valve: The pulmonic valve was normal in structure. Pulmonic valve regurgitation is not visualized. No evidence of pulmonic stenosis.  Aorta: The aortic root is normal in size and structure.  Venous: The inferior vena cava is normal in size with greater than 50% respiratory variability, suggesting right atrial pressure of 3 mmHg.  IAS/Shunts: No atrial level shunt detected by color flow Doppler.   LEFT VENTRICLE PLAX 2D LVIDd:  3.40 cm   Diastology LVIDs:         1.90 cm   LV e' medial:    7.18 cm/s LV PW:         1.10 cm   LV E/e' medial:  11.7 LV IVS:        1.20 cm   LV e' lateral:   12.00 cm/s LVOT diam:     2.00 cm   LV E/e' lateral: 7.0 LV SV:         67 LV SV Index:   42 LVOT Area:     3.14 cm   RIGHT VENTRICLE RV S prime:     20.60 cm/s TAPSE (M-mode): 1.6 cm  LEFT ATRIUM             Index        RIGHT ATRIUM          Index LA diam:        2.60 cm 1.62 cm/m   RA Area:     8.15 cm LA Vol (A2C):   15.6 ml 9.74 ml/m   RA Volume:   13.60 ml 8.49 ml/m LA Vol (A4C):   21.4 ml 13.36 ml/m LA Biplane Vol: 19.0 ml 11.86 ml/m AORTIC VALVE AV Area (Vmax):    2.76 cm AV Area (Vmean):   2.84 cm AV Area (VTI):     2.80 cm AV Vmax:           175.00 cm/s AV Vmean:          116.000 cm/s AV VTI:            0.239 m AV Peak Grad:      12.2 mmHg AV Mean Grad:      6.0 mmHg LVOT Vmax:         154.00  cm/s LVOT Vmean:        105.000 cm/s LVOT VTI:          0.213 m LVOT/AV VTI ratio: 0.89  AORTA Ao Root diam: 2.20 cm Ao Asc diam:  2.30 cm  MITRAL VALVE MV Area (PHT): 5.13 cm     SHUNTS MV Decel Time: 148 msec     Systemic VTI:  0.21 m MV E velocity: 83.80 cm/s   Systemic Diam: 2.00 cm MV A velocity: 111.00 cm/s MV E/A ratio:  0.75  Aditya Sabharwal Electronically signed by Ria Commander Signature Date/Time: 02/05/2024/10:58:50 AM    Final    MONITORS  LONG TERM MONITOR (3-14 DAYS) 04/13/2024  Narrative Patch Wear Time:  11 days and 19 hours (2025-07-02T14:39:01-399 to 2025-07-14T10:13:29-0400)  Patient had a min HR of 54 bpm, max HR of 188 bpm, and avg HR of 75 bpm. Predominant underlying rhythm was Sinus Rhythm. 2 Supraventricular Tachycardia runs occurred, the run with the fastest interval lasting 7 beats with a max rate of 188 bpm, the longest lasting 7 beats with an avg rate of 101 bpm. Isolated SVEs were rare (<1.0%), SVE Couplets were rare (<1.0%), and SVE Triplets were rare (<1.0%). Isolated VEs were rare (<1.0%), and no VE Couplets or VE Triplets were present.  Summary: Sinus rhythm, average HR 75 bpm. Rare ectopics. One short supraventricular run of 7 beats. No afib or flutter. Triggered events occur during sinus tachycardia.   CT SCANS  CT CARDIAC SCORING (SELF PAY ONLY) 12/09/2023  Addendum 12/11/2023 10:08 PM ADDENDUM REPORT: 12/11/2023 22:05  EXAM: OVER-READ INTERPRETATION  CT CHEST  The following report is an over-read performed by radiologist Dr.  Oneil Devonshire of [facility], [place] on 12/11/2023. This over-read does not include interpretation of cardiac or coronary anatomy or pathology. The coronary calcium  score interpretation by the cardiologist is attached.  COMPARISON:  09/16/2023  FINDINGS: Cardiovascular: There are no significant extracardiac vascular findings.  Mediastinum/Nodes: There are no enlarged lymph nodes within  the visualized mediastinum.  Lungs/Pleura: There is no pleural effusion. Lungs are well aerated bilaterally. A stable 6 mm nodule in the right lower lobe is noted best seen on image number 22 of series 4. No new nodules are noted.  Upper abdomen: Upper abdomen again demonstrates a hypodense lesion in the posterior aspect of the right lobe of the liver. This measures approximately 6 cm and greatest dimension has previously evaluated on multiple CTs of the abdomen pelvis.  Musculoskeletal/Chest wall: No chest wall mass or suspicious osseous findings within the visualized chest.  IMPRESSION: Stable 6 mm nodule in the right lower lobe.  Stable hypodense lesion in the posterior aspect of the right lobe of the liver incompletely evaluated on this noncontrast study but previously delineated on multiple exams.   Electronically Signed By: Oneil Devonshire M.D. On: 12/11/2023 22:05  Narrative CLINICAL DATA:  Cardiovascular Disease Risk stratification  EXAM: Coronary Calcium  Score  TECHNIQUE: A gated, non-contrast computed tomography scan of the heart was performed using 3mm slice thickness. Axial images were analyzed on a dedicated workstation. Calcium  scoring of the coronary arteries was performed using the Agatston method.  FINDINGS: Coronary arteries: Normal origins.  Coronary Calcium  Score:  Left main: 0  Left anterior descending artery: 0  Left circumflex artery: 0  Right coronary artery: 0  Total: 0  Percentile: 0  Pericardium: Normal.  Aorta: Normal caliber of ascending aorta. No aortic atherosclerosis noted.  Non-cardiac: See separate report from [facility].  IMPRESSION: Coronary calcium  score of 0. This was 0 percentile for age-, race-, and sex-matched controls.  RECOMMENDATIONS: Coronary artery calcium  (CAC) score is a strong predictor of incident coronary heart disease (CHD) and provides predictive information beyond traditional risk  factors. CAC scoring is reasonable to use in the decision to withhold, postpone, or initiate statin therapy in intermediate-risk or selected borderline-risk asymptomatic adults (age 93-75 years and LDL-C >=70 to <190 mg/dL) who do not have diabetes or established atherosclerotic cardiovascular disease (ASCVD).* In intermediate-risk (10-year ASCVD risk >=7.5% to <20%) adults or selected borderline-risk (10-year ASCVD risk >=5% to <7.5%) adults in whom a CAC score is measured for the purpose of making a treatment decision the following recommendations have been made:  If CAC=0, it is reasonable to withhold statin therapy and reassess in 5 to 10 years, as long as higher risk conditions are absent (diabetes mellitus, family history of premature CHD in first degree relatives (males <55 years; females <65 years), cigarette smoking, or LDL >=190 mg/dL).  If CAC is 1 to 99, it is reasonable to initiate statin therapy for patients >=64 years of age.  If CAC is >=100 or >=75th percentile, it is reasonable to initiate statin therapy at any age.  Cardiology referral should be considered for patients with CAC scores >=400 or >=75th percentile.  *2018 AHA/ACC/AACVPR/AAPA/ABC/ACPM/ADA/AGS/APhA/ASPC/NLA/PCNA Guideline on the Management of Blood Cholesterol: A Report of the American College of Cardiology/American Heart Association Task Force on Clinical Practice Guidelines. J Am Coll Cardiol. 2019;73(24):3168-3209.  Shelda Bruckner, MD  Electronically Signed: By: Shelda Bruckner M.D. On: 12/09/2023 16:41     ______________________________________________________________________________________________      Risk Assessment/Calculations  CHA2DS2-VASc Score = 2  This indicates a 2.2% annual risk of stroke. The patient's score is based upon: CHF History: 0 HTN History: 1 Diabetes History: 0 Stroke History: 0 Vascular Disease History: 0 Age Score: 0 Gender Score: 1              Physical Exam VS:  BP 114/70 (BP Location: Right Arm, Patient Position: Sitting, Cuff Size: Normal)   Pulse 70   Resp 16   Ht 5' 2 (1.575 m)   Wt 118 lb (53.5 kg)   LMP  (LMP Unknown) Comment: no chance per pt  SpO2 98%   BMI 21.58 kg/m        Wt Readings from Last 3 Encounters:  04/28/24 118 lb (53.5 kg)  03/17/24 124 lb 9.6 oz (56.5 kg)  02/19/24 127 lb 10.3 oz (57.9 kg)    GEN: Well nourished, well developed in no acute distress NECK: No JVD; No carotid bruits CARDIAC: RRR, no murmurs, rubs, gallops RESPIRATORY:  Clear to auscultation without rales, wheezing or rhonchi  ABDOMEN: Soft, non-tender, non-distended EXTREMITIES:  No edema; No deformity   ASSESSMENT AND PLAN  Postop atrial fibrillation: She had a single episode of postop A-fib after her recent surgery.  She was not candidate for anticoagulation at the time in postop.  There has been no recurrence of A-fib since.  Recent heart monitor showed no recurrence of atrial fibrillation.  GIST tumor: Followed by oncology service service  Hypertension: Blood pressure well-controlled       Dispo: Follow-up with Dr. Wonda in 4 months  Signed, Latarra Eagleton, [place]

## 2024-05-05 DIAGNOSIS — F439 Reaction to severe stress, unspecified: Secondary | ICD-10-CM | POA: Diagnosis not present

## 2024-05-10 ENCOUNTER — Other Ambulatory Visit (HOSPITAL_COMMUNITY): Payer: Self-pay

## 2024-05-10 ENCOUNTER — Encounter: Payer: Self-pay | Admitting: Hematology and Oncology

## 2024-05-11 ENCOUNTER — Other Ambulatory Visit (HOSPITAL_COMMUNITY): Payer: Self-pay

## 2024-05-12 ENCOUNTER — Other Ambulatory Visit (HOSPITAL_COMMUNITY): Payer: Self-pay

## 2024-05-12 MED ORDER — PANTOPRAZOLE SODIUM 40 MG PO TBEC
40.0000 mg | DELAYED_RELEASE_TABLET | Freq: Every day | ORAL | 11 refills | Status: DC
  Filled 2024-05-12 – 2024-05-14 (×2): qty 30, 30d supply, fill #0
  Filled 2024-06-16: qty 30, 30d supply, fill #1
  Filled 2024-07-23 (×2): qty 30, 30d supply, fill #2
  Filled 2024-08-17: qty 30, 30d supply, fill #3

## 2024-05-13 ENCOUNTER — Other Ambulatory Visit (HOSPITAL_COMMUNITY): Payer: Self-pay

## 2024-05-14 ENCOUNTER — Other Ambulatory Visit (HOSPITAL_COMMUNITY): Payer: Self-pay

## 2024-05-24 ENCOUNTER — Other Ambulatory Visit (HOSPITAL_COMMUNITY): Payer: Self-pay

## 2024-05-24 MED ORDER — METHOCARBAMOL 500 MG PO TABS
500.0000 mg | ORAL_TABLET | Freq: Three times a day (TID) | ORAL | 2 refills | Status: DC
  Filled 2024-05-24: qty 40, 14d supply, fill #0
  Filled 2024-08-11: qty 40, 14d supply, fill #1

## 2024-06-16 ENCOUNTER — Other Ambulatory Visit (HOSPITAL_COMMUNITY): Payer: Self-pay

## 2024-06-21 ENCOUNTER — Other Ambulatory Visit (HOSPITAL_COMMUNITY): Payer: Self-pay | Admitting: Internal Medicine

## 2024-06-21 DIAGNOSIS — C499 Malignant neoplasm of connective and soft tissue, unspecified: Secondary | ICD-10-CM

## 2024-06-21 DIAGNOSIS — Q8500 Neurofibromatosis, unspecified: Secondary | ICD-10-CM

## 2024-06-21 DIAGNOSIS — C49A3 Gastrointestinal stromal tumor of small intestine: Secondary | ICD-10-CM

## 2024-06-22 ENCOUNTER — Other Ambulatory Visit (HOSPITAL_COMMUNITY): Payer: Self-pay

## 2024-06-22 DIAGNOSIS — F330 Major depressive disorder, recurrent, mild: Secondary | ICD-10-CM | POA: Diagnosis not present

## 2024-06-22 DIAGNOSIS — F411 Generalized anxiety disorder: Secondary | ICD-10-CM | POA: Diagnosis not present

## 2024-06-22 MED ORDER — VENLAFAXINE HCL ER 75 MG PO CP24
150.0000 mg | ORAL_CAPSULE | Freq: Every day | ORAL | 1 refills | Status: AC
  Filled 2024-06-22 – 2024-07-21 (×4): qty 180, 90d supply, fill #0
  Filled 2024-10-16: qty 180, 90d supply, fill #1

## 2024-06-22 MED ORDER — ALPRAZOLAM 0.5 MG PO TABS
0.2500 mg | ORAL_TABLET | Freq: Two times a day (BID) | ORAL | 1 refills | Status: AC | PRN
  Filled 2024-06-22 – 2024-07-20 (×3): qty 180, 90d supply, fill #0
  Filled 2024-10-04 – 2024-10-14 (×3): qty 180, 90d supply, fill #1

## 2024-06-30 ENCOUNTER — Other Ambulatory Visit (HOSPITAL_COMMUNITY): Payer: Self-pay | Admitting: Hematology and Oncology

## 2024-06-30 DIAGNOSIS — Z170 Estrogen receptor positive status [ER+]: Secondary | ICD-10-CM

## 2024-06-30 DIAGNOSIS — C49A3 Gastrointestinal stromal tumor of small intestine: Secondary | ICD-10-CM

## 2024-06-30 DIAGNOSIS — C499 Malignant neoplasm of connective and soft tissue, unspecified: Secondary | ICD-10-CM

## 2024-06-30 DIAGNOSIS — Z923 Personal history of irradiation: Secondary | ICD-10-CM

## 2024-06-30 DIAGNOSIS — Z803 Family history of malignant neoplasm of breast: Secondary | ICD-10-CM

## 2024-07-06 DIAGNOSIS — C786 Secondary malignant neoplasm of retroperitoneum and peritoneum: Secondary | ICD-10-CM | POA: Diagnosis not present

## 2024-07-06 DIAGNOSIS — C50412 Malignant neoplasm of upper-outer quadrant of left female breast: Secondary | ICD-10-CM | POA: Diagnosis not present

## 2024-07-06 DIAGNOSIS — Z170 Estrogen receptor positive status [ER+]: Secondary | ICD-10-CM | POA: Diagnosis not present

## 2024-07-06 DIAGNOSIS — C787 Secondary malignant neoplasm of liver and intrahepatic bile duct: Secondary | ICD-10-CM | POA: Diagnosis not present

## 2024-07-06 DIAGNOSIS — C49A3 Gastrointestinal stromal tumor of small intestine: Secondary | ICD-10-CM | POA: Diagnosis not present

## 2024-07-06 DIAGNOSIS — C7889 Secondary malignant neoplasm of other digestive organs: Secondary | ICD-10-CM | POA: Diagnosis not present

## 2024-07-06 DIAGNOSIS — Q8500 Neurofibromatosis, unspecified: Secondary | ICD-10-CM | POA: Diagnosis not present

## 2024-07-07 ENCOUNTER — Ambulatory Visit (HOSPITAL_COMMUNITY)
Admission: RE | Admit: 2024-07-07 | Discharge: 2024-07-07 | Disposition: A | Discharge status: Home / Self Care | Source: Ambulatory Visit | Attending: Internal Medicine | Admitting: Internal Medicine

## 2024-07-07 DIAGNOSIS — C499 Malignant neoplasm of connective and soft tissue, unspecified: Secondary | ICD-10-CM | POA: Insufficient documentation

## 2024-07-07 DIAGNOSIS — R590 Localized enlarged lymph nodes: Secondary | ICD-10-CM | POA: Diagnosis not present

## 2024-07-07 DIAGNOSIS — C49A3 Gastrointestinal stromal tumor of small intestine: Secondary | ICD-10-CM | POA: Insufficient documentation

## 2024-07-07 DIAGNOSIS — R1903 Right lower quadrant abdominal swelling, mass and lump: Secondary | ICD-10-CM | POA: Diagnosis not present

## 2024-07-07 DIAGNOSIS — Q8500 Neurofibromatosis, unspecified: Secondary | ICD-10-CM | POA: Insufficient documentation

## 2024-07-07 MED ORDER — IOHEXOL 300 MG/ML  SOLN
100.0000 mL | Freq: Once | INTRAMUSCULAR | Status: AC | PRN
  Administered 2024-07-07: 100 mL via INTRAVENOUS

## 2024-07-08 ENCOUNTER — Other Ambulatory Visit: Payer: Self-pay

## 2024-07-08 ENCOUNTER — Other Ambulatory Visit (HOSPITAL_COMMUNITY): Payer: Self-pay

## 2024-07-13 ENCOUNTER — Other Ambulatory Visit: Payer: Self-pay

## 2024-07-13 DIAGNOSIS — Q8500 Neurofibromatosis, unspecified: Secondary | ICD-10-CM | POA: Diagnosis not present

## 2024-07-13 DIAGNOSIS — C499 Malignant neoplasm of connective and soft tissue, unspecified: Secondary | ICD-10-CM | POA: Diagnosis not present

## 2024-07-13 DIAGNOSIS — C49A3 Gastrointestinal stromal tumor of small intestine: Secondary | ICD-10-CM | POA: Diagnosis not present

## 2024-07-13 NOTE — Progress Notes (Signed)
 Received Secure Chat message from [place], [place] stating she was contacted by [facility] to have pt's latest CT Scan images pushed to them in [employer].  Sent email to [employer] requesting the images to be sent to [facility] and contact [employer] to verbally request the images to be sent.  [place] w/[employer] pushed images to [facility].

## 2024-07-13 NOTE — Progress Notes (Addendum)
 [facility] Visit   Name:  Anne Horton MRN:  [id number] Date:  07/13/2024  Diagnosis:  1. Metastatic sarcoma    (CMD)  [facility] Appointment Request   CBC with Differential   Comprehensive Metabolic Panel   Lactate Dehydrogenase (LDH)   CBC with Differential   Comprehensive Metabolic Panel   Lactate Dehydrogenase (LDH)   CANCELED: CBC with Differential    2. GIST (gastrointestinal stromal tumor) of small bowel, malignant (HCC)  [facility] Appointment Request    3. Neurofibromatosis [facility])  [facility] Appointment Request    4. GIST (gastrointestinal stromal tumor) of small bowel, malignant    (CMD)  CBC with Differential         History of Present Illness: Anne Horton is a 48 y.o.  female with a PMH of NF1, IDA, HTN, breast cancer (stage IA s/p lumpectomy 2019 on Tamoxifen ), and GIST diagnosed in 2022.   GIST (gastrointestinal stromal tumor) of small bowel, malignant (HCC) Staging form: Gastrointestinal Stromal Tumor - Small Intestinal, Esophageal, Colorectal, Mesenteric, and Peritoneal GIST, AJCC 8th Edition - Pathologic stage from 04/03/2021: Stage IIIB (pT4, pN0, cM0, Mitotic Rate: High) - Signed by [name], MD on 04/23/2021       Stage prefix: Initial diagnosis       Histologic grade (G): High grade       Histologic grading system: 2 grade system       Residual tumor (R): R0 - None  - Resection on 02/26/21 under Dr. Aron showed multiple tumor in small bowel, with three small lesions which were unable to be resected. Path confirmed GIST, high grade, margins and lymph nodes were negative.   Multifocal GIST of small bowel, pT4N0Mo, stage IIIB, high grade, mitotic rate 9/HPF, peritoneal metastasis in 03/2023, KIT, PDGFRA mutation (-) - Started imatinib  400 mg daily on 05/07/21 - CT's did not show evidence of recurrence until 03/08/2023, and showed a large right ovarian mass. - She underwent exploratory laparoscope by Dr. Aron and GYN Dr. Viktoria on 04/09/2023, she  underwent resection of her right ovarian mass (path confirmed GIST), BSO and hysterectomy. Unfortunately she was found to have multiple peritoneal metastasis, biopsy confirmed metastatic GIST. - Started sunitinib 50mg  on day 1-28 of 42 days on 05/05/23 - 05/21/23 stopped due to progression Patient elected to start off label use of ivermectin     Malignant neoplasm of upper-outer quadrant of left breast in female, estrogen receptor positive (HCC) Staging form: Breast, AJCC 8th Edition - Clinical stage from 03/20/2018: Stage IA (cT1c, cN0, cM0, G2, ER+, PR+, HER2-)  - Pathologic stage from 05/07/2018: Stage IA (pT1c, pN0, cM0, G1, ER+, PR+, HER2-, Oncotype DX score: 11)  - 03/20/2018 Initial Diagnosis: Screening detected left breast spiculated mass by ultrasound measured 1.2 cm. Biopsy revealed invasive ductal carcinoma grade 1-2 with high-grade DCIS, ER 95%, PR 95%, Ki-67 1%, HER-2 negative ratio 1.47, T1CN0 stage I a clinical stage - 03/20/2018 Cancer Staging       Staging form: Breast, AJCC 8th Edition      Clinical stage from 03/20/2018: Stage IA (cT1c, cN0, cM0, G2, ER+, PR+, HER2-) - Signed by Crawford Morna Pickle, NP on 05/27/2018 - 04/08/2018 Genetic Testing: No pathogenic variants identified. A Variant of uncertain significance in the gene NF1 was detected c.2325G>A (Silent). The date of this test report is 04/03/2018.  - 05/07/2018 Surgery: Left lumpectomy: IDC grade 1, 1.2 cm, intermediate grade DCIS, margins negative,PASH, 0/7 sentinel lymph nodes negative, ER 95%, PR 95%, Ki-67  1%, HER-2 negative ratio 1.47, T1CN0 stage Ia - 05/07/2018 Cancer Staging    Staging form: Breast, AJCC 8th Edition Pathologic stage from 05/07/2018: Stage IA (pT1c, pN0, cM0, G1, ER+, PR+, HER2-, Oncotype DX score: 11) - 05/25/2018 Oncotype testing: Oncotype DX score 11: 3% risk of distant recurrence of 9 years, low risk - 06/24/2018 - 08/03/2018 Radiation Therapy  Adjuvant radiation therapy - 09/2018-Current: Anti-estrogen  oral therapy: Tamoxifen  daily  -01/29/2024 GIST tumor debulking, antrectomy,splenectomy, partial right hepatectomy,resection of 2 pelvic masses, resection of multiple peritoneal metastases, repair of diaphragm bilaterally  Current Status: I had the pleasure of seeing Anne Horton in clinic today for elective evaluation. Patient notes she continues to work and is doing reasonably well. She notes problems with digestion including feeling that food is backing up with bloating feeling. Patient is avoiding foods which cause this problem. Patient notes otherwise she is without complaints. Patient is accompanied by her husband today. She notes she continues on Ivermectin but is not on full dose due to cost. Patient expresses her concern that treatment Dr. Marci has recommended has been denied coverage by her insurance.    ECOG/Zubrod Performance Status: 0 - Fully active; no performance restrictions KPS (100-90%)   Past Medical History: Past Medical History:  Diagnosis Date  . Abnormal Pap smear of cervix   . GIST (gastrointestinal stroma tumor), malignant, colon    (CMD)   . Malignant neoplasm of upper-outer quadrant of left breast in female, estrogen receptor positive    (CMD) 03/25/2018   Past Surgical History:  Procedure Laterality Date  . ABDOMINAL SURGERY   04/03/2021   Procedure: ABDOMINAL SURGERY; Gist; bowel resection  . BREAST SURGERY Left 2019   Procedure: BREAST SURGERY; Upper Quadrant Lumpectomy  . CESAREAN SECTION, UNSPECIFIED     Procedure: CESAREAN SECTION  . LAPAROSCOPIC CHOLECYSTECTOMY     Procedure: LAPAROSCOPIC CHOLECYSTECTOMY  . MASTOPEXY Bilateral 09/05/2020   Procedure: MASTOPEXY  . WISDOM TOOTH EXTRACTION     Procedure: WISDOM TOOTH EXTRACTION    Past Family History: Family History  Problem Relation Name Age of Onset  . Hyperlipidemia Mother    . Hypertension Mother    . Breast cancer Mother  80 - 56  . Diabetes Mother    . Kidney disease Mother    . Tongue  cancer Mother    . Cancer Mother         Gyn, unsure exactly what  . Neurofibromatosis Father    . Cancer Paternal Grandmother         unsure what type, metastatic, in her 62s  . Diabetes Paternal Uncle     Social History   Socioeconomic History  . Marital status: Married    Spouse name: Not on file  . Number of children: Not on file  . Years of education: Not on file  . Highest education level: Not on file  Occupational History  . Not on file  Tobacco Use  . Smoking status: Never  . Smokeless tobacco: Never  Vaping Use  . Vaping status: Never Used  Substance and Sexual Activity  . Alcohol use: Yes    Comment: occ  . Drug use: No  . Sexual activity: Yes    Partners: Male    Birth control/protection: None  Other Topics Concern  . Not on file  Social History Narrative  . Not on file   Social Drivers of Health   Food Insecurity: Low Risk  (04/20/2024)   Food vital sign   .  Within the past 12 months, you worried that your food would run out before you got money to buy more: Never true   . Within the past 12 months, the food you bought just didn't last and you didn't have money to get more: Never true  Transportation Needs: No Transportation Needs (04/20/2024)   Transportation   . In the past 12 months, has lack of reliable transportation kept you from medical appointments, meetings, work or from getting things needed for daily living? : No  Safety: Low Risk  (04/20/2024)   Safety   . How often does anyone, including family and friends, physically hurt you?: Not on file   . How often does anyone, including family and friends, insult or talk down to you?: Never   . How often does anyone, including family and friends, threaten you with harm?: Never   . How often does anyone, including family and friends, scream or curse at you?: Never  Living Situation: Low Risk  (04/20/2024)   Living Situation   . What is your living situation today?: I have a steady place to live   . Think  about the place you live. Do you have problems with any of the following? Choose all that apply:: None/None on this list    Review of Systems  Constitutional:  Negative for activity change, diaphoresis, fatigue and fever.  Respiratory:  Negative for shortness of breath.   Cardiovascular:  Negative for chest pain and palpitations.  Gastrointestinal:  Negative for abdominal distention, abdominal pain, anal bleeding, blood in stool, constipation, diarrhea, nausea and vomiting.       Intermittent bloating  Musculoskeletal:  Negative for back pain.  Skin:  Negative for color change.  All other systems reviewed and are negative.    Medications:  Current Outpatient Medications:  .  ALPRAZolam  (XANAX ) 0.5 mg tablet, Take 1/2 to 1 tab 2 times a day as needed, Disp: 180 tablet, Rfl: 1 .  cetirizine (ZyrTEC) 10 mg tablet, Take 10 mg by mouth daily., Disp: , Rfl:  .  metoprolol  succinate (TOPROL  XL) 50 mg 24 hr tablet, Take 50 mg by mouth daily., Disp: , Rfl: 3 .  multivitamin (THERAGRAN) tab tablet, Take 1 tablet by mouth daily., Disp: , Rfl:  .  ondansetron  (ZOFRAN ) 8 mg tablet, Take 1 tablet (8 mg total) by mouth every 8 (eight) hours as needed for nausea or vomiting., Disp: 60 tablet, Rfl: 2 .  propranoloL  (INDERAL ) 10 mg tablet, , Disp: , Rfl: 3 .  tamoxifen  (NOLVADEX ) 20 mg tablet, Take 20 mg by mouth daily., Disp: , Rfl:  .  venlafaxine  (EFFEXOR  XR) 75 mg 24 hr capsule, Take 2 a day (150mg ), Disp: 180 capsule, Rfl: 1  Allergies:  Allergies  Allergen Reactions  . Iron  Sucrose Respiratory Distress  . Adhesive     Dermabond - hives and blisters  . Latex Rash    Vital Signs: BP (!) 108/56 (BP Location: Right arm, Patient Position: Sitting)   Pulse 65   Temp 97.6 F (36.4 C) (Temporal)   Ht 1.6 m (5' 3)   Wt 52.5 kg (115 lb 12.8 oz)   SpO2 100%   BMI 20.51 kg/m   Physical Exam Vitals reviewed.  Constitutional:      General: She is not in acute distress.    Appearance: Normal  appearance.  HENT:     Head: Normocephalic and atraumatic.  Cardiovascular:     Rate and Rhythm: Normal rate and regular rhythm.  Heart sounds: Normal heart sounds.  Pulmonary:     Effort: Pulmonary effort is normal. No respiratory distress.     Breath sounds: Normal breath sounds.  Abdominal:     Palpations: Abdomen is soft.     Tenderness: There is no abdominal tenderness.  Musculoskeletal:        General: Normal range of motion.  Skin:    General: Skin is warm and dry.     Comments: Multiple masses related to her neurofibromatosis over body.  Neurological:     General: No focal deficit present.     Mental Status: She is alert and oriented to person, place, and time. Mental status is at baseline.  Psychiatric:        Mood and Affect: Mood normal.        Behavior: Behavior normal.        Thought Content: Thought content normal.        Judgment: Judgment normal.      Laboratory Data: Recent Results (from the past week)  Comprehensive Metabolic Panel   Collection Time: 07/13/24  4:00 PM  Result Value Ref Range   Sodium 138 136 - 145 mmol/L   Potassium 4.2 3.4 - 4.5 mmol/L   Chloride 103 98 - 107 mmol/L   CO2 31 21 - 31 mmol/L   Anion Gap 4 (L) 6 - 14 mmol/L   Glucose, Random 106 (H) 70 - 99 mg/dL   Blood Urea Nitrogen (BUN) 11 7 - 25 mg/dL   Creatinine 9.41 (L) 9.39 - 1.20 mg/dL   eGFR >09 >40 fO/fpw/8.26f7   Albumin  3.9 3.5 - 5.7 g/dL   Total Protein 6.3 (L) 6.4 - 8.9 g/dL   Bilirubin, Total 0.4 0.3 - 1.0 mg/dL   Alkaline Phosphatase (ALP) 53 34 - 104 U/L   Aspartate Aminotransferase (AST) 19 13 - 39 U/L   Alanine Aminotransferase (ALT) 13 7 - 52 U/L   Calcium  8.7 8.6 - 10.3 mg/dL   BUN/Creatinine Ratio 19.0 10.0 - 20.0  Lactate Dehydrogenase (LDH)   Collection Time: 07/13/24  4:00 PM  Result Value Ref Range   Lactate Dehydrogenase (LDH) 154 140 - 271 U/L  CBC with Differential   Collection Time: 07/13/24  4:00 PM  Result Value Ref Range   WBC 8.10 4.40 -  11.00 10*3/uL   RBC 3.99 (L) 4.10 - 5.10 10*6/uL   Hemoglobin 11.6 (L) 12.3 - 15.3 g/dL   Hematocrit 65.0 (L) 64.0 - 44.6 %   Mean Corpuscular Volume (MCV) 87.5 80.0 - 96.0 fL   Mean Corpuscular Hemoglobin (MCH) 29.0 27.5 - 33.2 pg   Mean Corpuscular Hemoglobin Conc (MCHC) 33.1 33.0 - 37.0 g/dL   Red Cell Distribution Width (RDW) 14.7 12.3 - 17.0 %   Platelet Count (PLT) 554 (H) 150 - 450 10*3/uL   Mean Platelet Volume (MPV) 6.7 (L) 6.8 - 10.2 fL   Neutrophils % 41 %   Lymphocytes % 41 %   Monocytes % 12 %   Eosinophils % 4 %   Basophils % 1 %   nRBC % 0 %   Neutrophils Absolute 3.30 1.80 - 7.80 10*3/uL   Lymphocytes # 3.40 1.00 - 4.80 10*3/uL   Monocytes # 1.00 (H) 0.00 - 0.80 10*3/uL   Eosinophils # 0.30 0.00 - 0.50 10*3/uL   Basophils # 0.10 0.00 - 0.20 10*3/uL   nRBC Absolute 0.00 <=0.00 10*3/uL    Imaging: Radiology Results (last 7 days)     Procedure Component Value Units  Date/Time   CT Outside Images Non Result [[id number]] Resulted: 07/13/24 1433   Order Status: Completed Updated: 07/13/24 1433   Narrative:     These images were imported for review and or comparison.   CT Outside Images Non Result [[id number]] Resulted: 07/13/24 9061   Order Status: Completed Updated: 07/13/24 9061   Narrative:     These images were imported for review and or comparison.   CT Abdomen Pelvis W IV Only [[id number]] Resulted: 07/10/24 9167   Order Status: Completed Updated: 07/13/24 0816   Narrative:     CLINICAL DATA:  Metastatic sarcoma, GIST, neurofibromatosis *Tracking Code: BO *EXAM:CT CHEST, ABDOMEN, AND PELVIS WITH CONTRASTTECHNIQUE:Multidetector CT imaging of the chest, abdomen and pelvis wasperformed following the standard protocol during bolusadministration of intravenous contrast.RADIATION DOSE REDUCTION: This exam was performed according to thedepartmental dose-optimization program which includes automatedexposure control, adjustment of the mA and/or kV according topatient size  and/or use of iterative reconstruction technique.CONTRAST:  OMNIPAQUE  IOHEXOL  300 MG/ML SOLN additional oralenteric contrastCOMPARISON:  07/16/2025FINDINGS:CT CHEST FINDINGSCardiovascular: No significant vascular findings. Normal heart size.No pericardial effusion.Mediastinum/Nodes: No enlarged mediastinal, hilar, or axillary lymphnodes. Thyroid  gland, trachea, and esophagus demonstrate nosignificant findings.Lungs/Pleura: Subpleural radiation fibrosis of the anterior leftlung. Unchanged nodule of the dependent right lower lobe measuring0.7 cm (series 5, image 71). No pleural effusion or pneumothorax.Musculoskeletal: Left lumpectomy and axillary lymph node dissection.No acute osseous findings.CT ABDOMEN PELVIS FINDINGSHepatobiliary: Partial right hepatectomy. Cholecystectomy. Nobiliary ductal dilatation.Pancreas: Unremarkable. No pancreatic ductal dilatation orsurrounding inflammatory changes.Spleen: Splenectomy.Adrenals/Urinary Tract: Adrenal glands are unremarkable. Kidneys arenormal, without renal calculi, solid lesion, or hydronephrosis.Bladder is unremarkable.Stomach/Bowel: Distal gastrectomy and gastrojejunostomy. Jejunalresection and reanastomosis. No evidence of bowel wall thickening,distention, or inflammatory changes.Vascular/Lymphatic: No significant vascular findings are present.Significant interval enlargement of multiple pelvic lymph nodes,large right external iliac mass measuring 4.4 x 3.0 cm, previously2.9 x 1.5 cm (series 2, image 103). Left external iliac nodulemeasures 2.5 x 2.4 cm, previously 1.7 x 1.5 cm (series 2, image101). Interval enlargement of lymph nodes or implants within thepelvic mesentery, largest measuring 1.9 x 1.4 cm, previously 1.0 x0.5 cm (series 2, image 95).Reproductive: Hysterectomy.Other: No abdominal wall hernia. Interval enlargement of multiplesoft tissue nodules in the right lower quadrant, the largest ofwhich to be within the internal table abdominal wall  musculaturemeasuring 2.5 x 2.4 cm, previously 1.3 x 0.9 cm (series 2, image74). Multiple additional smaller nodules within the abdominal walland about the underlying peritoneum, largest measuring 1.0 x 0.8 cm,previously 0.4 cm (series 2, image 77). No ascites.Musculoskeletal: No acute osseous findings.IMPRESSION:1. Significant interval enlargement of multiple pelvic lymph nodes.2. Interval enlargement of lymph nodes or implants within the pelvicmesentery.3. Interval enlargement of multiple peritoneal and abdominal wallsoft tissue nodules in the right lower quadrant.4. Unchanged nodule of the dependent right lower lobe measuring 0.7cm.5. Multiple postoperative findings as detailed above. Electronically Signed  By: Marolyn JONETTA Jaksch M.D.  On: 07/10/2024 08:32      Pathology: Collected 05/05/2023 14:43     Status: Final result     Dx: Gastrointestinal stromal tumor, unspe...   Test Result Released: Yes (seen)   0 Result Notes      Component Ref Range & Units (hover)    Final Diagnosis  Slides for consultation ( [id number] , 04/04/2023)   Small bowel and omentum, resection (A1-A22):              Gastrointestinal stromal tumor, multiple  The largest size 17.5 cm                           Margins free of tumor                           9 lymph nodes negative for tumor (0/9)                           Pathologic staging: mpT4 N0                           See synoptic report    Electronically signed by Collin Farr, MD on 05/06/2023 at 0920 EDT  Attestation Statement    I verify that I have personally reviewed all relevant slides/materials for this case and rendered or confirmed the diagnosis.  Synoptic Checklist  GASTROINTESTINAL STROMAL TUMOR (GIST): Resection  8th Edition - Protocol posted: 12/14/2022GASTROINTESTINAL STROMAL TUMOR (GIST       Impression and Plan: Impression Recommendations  Metastatic GIST I have personally spent 39 minutes involved in  face-to-face and non-face-to-face activities for this patient on the day of the visit.  Professional time spent includes the following activities, in addition to those noted in the documentation: Discussed case with Dr. Marci we are working with insurance company regarding coverage for trametinib and have requested a peer to peer. Patient does not qualify for financial assistance due to income. Discussed that recent CT shows progression of disease. Discussed expected symptoms as tumors grow in response to Patient's questions. Dr. Marci briefly saw Patient.  - Trametinib 2mg  daily has been found to help GIST patients with her particular mutations.   - Patient would like to continue Ivermectin and we discussed that we will not treat her with anything while she is also on ivermectin. She voiced understanding. She was agreeable that we work on therapist, occupational for trametinib and in the meantime she will continue Ivermectin. Once we get insurance approval she will make a decision on changing treatment.  - Once trametinib is approved  and started we will obtain weekly labs and see her 4 weeks after she starts. Follow up pending insurance approval   Electronically signed by:  Eleanor Jenkins Pang, PA-C, 07/13/2024 9:05 PM Addendum updated HPI with surgery done at [facility] on 01/29/2024. Electronically signed by:  Eleanor Jenkins Pang, PA-C, 07/20/2024 8:28 AM

## 2024-07-19 ENCOUNTER — Other Ambulatory Visit: Payer: Self-pay

## 2024-07-19 NOTE — Assessment & Plan Note (Signed)
 Multifocal GIST of small bowel, pT4N0Mo, stage IIIB, high grade, mitotic rate 9/HPF, pertoneal metastasis in 03/2023, KIT and PDGFRA mutation (-), NF1(+) -Diagnosed in 02/2021 -resection on 02/26/21 under Dr. Aron showed multiple tumor in small bowel, with three small lesions which were unable to be resected. Path confirmed GIST, high grade, margins and lymph nodes were negative. -she began imatinib  400 mg daily on 05/07/21, stopped in 03/2023 due to disease progression.  -repeated CT have not sure evidence of recurrence until 03/08/2023, and showed a large right ovarian mass. -She underwent exploratory laparoscope by Dr. Aron and GYN Dr. Viktoria on 04/09/2023, she underwent resection of her right ovarian mass (path confirmed GIST), BSO and hysterectomy.  Unfortunately she was found to have multiple peritoneal metastasis, biopsy confirmed metastatic GIST. -Due to the significant disease progression to peritoneum, I recommended changing her treatment to Sunitinib, she tried standard dose 50mg  daily for 4 weeks on and 2 weeks off, but did not tolerate well with multiple side effects and stopped after about 3 weeks. -she has bene under the care of Dr. Marci at [facility] for her GIST management since 04/2023

## 2024-07-19 NOTE — Assessment & Plan Note (Signed)
 ER+/PR+/HER2-, stage I (pT1cN0) -Oncotype DX score 11 -s/p lumpectomy in 04/2018 and adjuvant radiation -She is on adjuvant tamoxifen , tolerating well. Plan for 10 years  -most recent mammogram 8/2024was negative. --She has moderate hot flashes, she is on Effexor  which she has reduced dose.  She still has gabapentin  at home, I encouraged her to try that for hot flashes and related insomnia. -she is s/p BSO in 03/2023 for metastatic GIST -continue Tamoxifen .  -she would like to f/u with me for her breast cancer surveillance

## 2024-07-20 ENCOUNTER — Other Ambulatory Visit: Payer: Self-pay

## 2024-07-20 ENCOUNTER — Other Ambulatory Visit (HOSPITAL_COMMUNITY): Payer: Self-pay

## 2024-07-20 ENCOUNTER — Inpatient Hospital Stay

## 2024-07-20 ENCOUNTER — Inpatient Hospital Stay: Attending: Hematology | Admitting: Hematology

## 2024-07-20 VITALS — BP 116/68 | HR 94 | Temp 98.3°F | Resp 17 | Ht 62.0 in | Wt 116.4 lb

## 2024-07-20 DIAGNOSIS — Z90710 Acquired absence of both cervix and uterus: Secondary | ICD-10-CM | POA: Insufficient documentation

## 2024-07-20 DIAGNOSIS — Z170 Estrogen receptor positive status [ER+]: Secondary | ICD-10-CM | POA: Insufficient documentation

## 2024-07-20 DIAGNOSIS — R0781 Pleurodynia: Secondary | ICD-10-CM | POA: Insufficient documentation

## 2024-07-20 DIAGNOSIS — G8918 Other acute postprocedural pain: Secondary | ICD-10-CM | POA: Insufficient documentation

## 2024-07-20 DIAGNOSIS — L905 Scar conditions and fibrosis of skin: Secondary | ICD-10-CM | POA: Diagnosis not present

## 2024-07-20 DIAGNOSIS — K869 Disease of pancreas, unspecified: Secondary | ICD-10-CM | POA: Diagnosis not present

## 2024-07-20 DIAGNOSIS — Z1732 Human epidermal growth factor receptor 2 negative status: Secondary | ICD-10-CM | POA: Diagnosis not present

## 2024-07-20 DIAGNOSIS — D259 Leiomyoma of uterus, unspecified: Secondary | ICD-10-CM | POA: Insufficient documentation

## 2024-07-20 DIAGNOSIS — I700 Atherosclerosis of aorta: Secondary | ICD-10-CM | POA: Insufficient documentation

## 2024-07-20 DIAGNOSIS — Z90722 Acquired absence of ovaries, bilateral: Secondary | ICD-10-CM | POA: Insufficient documentation

## 2024-07-20 DIAGNOSIS — Z803 Family history of malignant neoplasm of breast: Secondary | ICD-10-CM | POA: Insufficient documentation

## 2024-07-20 DIAGNOSIS — N8003 Adenomyosis of the uterus: Secondary | ICD-10-CM | POA: Insufficient documentation

## 2024-07-20 DIAGNOSIS — N8300 Follicular cyst of ovary, unspecified side: Secondary | ICD-10-CM | POA: Diagnosis not present

## 2024-07-20 DIAGNOSIS — Z7982 Long term (current) use of aspirin: Secondary | ICD-10-CM | POA: Diagnosis not present

## 2024-07-20 DIAGNOSIS — Q8501 Neurofibromatosis, type 1: Secondary | ICD-10-CM | POA: Insufficient documentation

## 2024-07-20 DIAGNOSIS — Z9081 Acquired absence of spleen: Secondary | ICD-10-CM | POA: Insufficient documentation

## 2024-07-20 DIAGNOSIS — C49A3 Gastrointestinal stromal tumor of small intestine: Secondary | ICD-10-CM | POA: Diagnosis present

## 2024-07-20 DIAGNOSIS — N736 Female pelvic peritoneal adhesions (postinfective): Secondary | ICD-10-CM | POA: Diagnosis not present

## 2024-07-20 DIAGNOSIS — Z79899 Other long term (current) drug therapy: Secondary | ICD-10-CM | POA: Insufficient documentation

## 2024-07-20 DIAGNOSIS — C50412 Malignant neoplasm of upper-outer quadrant of left female breast: Secondary | ICD-10-CM | POA: Insufficient documentation

## 2024-07-20 DIAGNOSIS — G4700 Insomnia, unspecified: Secondary | ICD-10-CM | POA: Insufficient documentation

## 2024-07-20 DIAGNOSIS — C786 Secondary malignant neoplasm of retroperitoneum and peritoneum: Secondary | ICD-10-CM | POA: Insufficient documentation

## 2024-07-20 DIAGNOSIS — Z5971 Insufficient health insurance coverage: Secondary | ICD-10-CM | POA: Diagnosis not present

## 2024-07-20 DIAGNOSIS — Z9049 Acquired absence of other specified parts of digestive tract: Secondary | ICD-10-CM | POA: Insufficient documentation

## 2024-07-20 DIAGNOSIS — Z1721 Progesterone receptor positive status: Secondary | ICD-10-CM | POA: Diagnosis not present

## 2024-07-20 DIAGNOSIS — Z923 Personal history of irradiation: Secondary | ICD-10-CM | POA: Insufficient documentation

## 2024-07-20 DIAGNOSIS — R0789 Other chest pain: Secondary | ICD-10-CM | POA: Insufficient documentation

## 2024-07-20 DIAGNOSIS — D75838 Other thrombocytosis: Secondary | ICD-10-CM | POA: Diagnosis not present

## 2024-07-20 DIAGNOSIS — Z79810 Long term (current) use of selective estrogen receptor modulators (SERMs): Secondary | ICD-10-CM | POA: Diagnosis not present

## 2024-07-20 DIAGNOSIS — Z91040 Latex allergy status: Secondary | ICD-10-CM | POA: Insufficient documentation

## 2024-07-20 NOTE — Progress Notes (Signed)
 [facility]   [phone] [phone]   Clinic Follow up Note   Patient Care Team: Walke, [name], PA-C as PCP - General (Family Medicine) [name], MD as PCP - Cardiology (Cardiology) [name], MD as Consulting Physician (Radiation Oncology) [name], MD as Consulting Physician (Hematology and Oncology) [name], MD as Consulting Physician (General Surgery) [name], MD as Consulting Physician (Oncology)  Date of Service:  07/20/2024  CHIEF COMPLAINT: f/u of breast cancer  CURRENT THERAPY:  Adjuvant tamoxifen   Oncology History   GIST (gastrointestinal stromal tumor) of small bowel, malignant (HCC) Multifocal GIST of small bowel, pT4N0Mo, stage IIIB, high grade, mitotic rate 9/HPF, pertoneal metastasis in 03/2023, KIT and PDGFRA mutation (-), NF1(+) -Diagnosed in 02/2021 -resection on 02/26/21 under Dr. Aron showed multiple tumor in small bowel, with three small lesions which were unable to be resected. Path confirmed GIST, high grade, margins and lymph nodes were negative. -she began imatinib  400 mg daily on 05/07/21, stopped in 03/2023 due to disease progression.  -repeated CT have not sure evidence of recurrence until 03/08/2023, and showed a large right ovarian mass. -She underwent exploratory laparoscope by Dr. Aron and GYN Dr. Viktoria on 04/09/2023, she underwent resection of her right ovarian mass (path confirmed GIST), BSO and hysterectomy.  Unfortunately she was found to have multiple peritoneal metastasis, biopsy confirmed metastatic GIST. -Due to the significant disease progression to peritoneum, I recommended changing her treatment to Sunitinib, she tried standard dose 50mg  daily for 4 weeks on and 2 weeks off, but did not tolerate well with multiple side effects and stopped after about 3 weeks. -she has bene under the care of Dr. Marci at [facility] for her GIST management since 04/2023  Malignant neoplasm of  upper-outer quadrant of left breast in female, estrogen receptor positive (HCC) ER+/PR+/HER2-, stage I (pT1cN0) -Oncotype DX score 11 -s/p lumpectomy in 04/2018 and adjuvant radiation -She is on adjuvant tamoxifen , tolerating well. Plan for 10 years  -most recent mammogram 8/2024was negative. --She has moderate hot flashes, she is on Effexor  which she has reduced dose.  She still has gabapentin  at home, I encouraged her to try that for hot flashes and related insomnia. -she is s/p BSO in 03/2023 for metastatic GIST -continue Tamoxifen , plan for 10 years   -she would like to f/u with me for her breast cancer surveillance   Assessment & Plan Malignant neoplasm of upper-outer quadrant of left breast Currently on tamoxifen  for breast cancer management with no reported issues. Tamoxifen  slightly increases risk of blood clots; advised to stay active. No significant interaction concerns with current medications. - Continue tamoxifen  therapy. - Scheduled follow-up mammogram and ultrasound in February. - Monitor for any new symptoms or medication interactions. - Encouraged staying active to mitigate blood clot risk. - Provided supportive care as needed, including pain management and IV fluids.  Metastatic GIST -Follow-up at [facility], Dr. Marci recommend trametinib as next line therapy, pending coverage   Recent CT scan showed enlarged lymph nodes, previously noted in July. Post-surgical pain managed without medication, using Tylenol  or Motrin  as needed. No current use of oxycodone .   Reactive thrombocytosis  -platelet count slightly elevated, likely reactive due to recent abdominal issues.   Plan - Continue tamoxifen , no concern for breast cancer recurrence - She is scheduled for next mammogram and ultrasound in February 2026 - Lab and follow-up in 6 months  SUMMARY OF ONCOLOGIC HISTORY: Oncology History Overview Note  Cancer Staging GIST (gastrointestinal stromal tumor)  of small bowel,  malignant (HCC) Staging form: Gastrointestinal Stromal Tumor - Small Intestinal, Esophageal, Colorectal, Mesenteric, and Peritoneal GIST, AJCC 8th Edition - Pathologic stage from 04/03/2021: Stage IIIB (pT4, pN0, cM0, Mitotic Rate: High) - Signed by [name], MD on 04/23/2021  Malignant neoplasm of upper-outer quadrant of left breast in female, estrogen receptor positive (HCC) Staging form: Breast, AJCC 8th Edition - Clinical stage from 03/20/2018: Stage IA (cT1c, cN0, cM0, G2, ER+, PR+, HER2-) - Signed by Crawford Morna Pickle, NP on 05/27/2018 - Pathologic stage from 05/07/2018: Stage IA (pT1c, pN0, cM0, G1, ER+, PR+, HER2-, Oncotype DX score: 11) - Signed by Crawford Morna Pickle, NP on 05/27/2018    Malignant neoplasm of upper-outer quadrant of left breast in female, estrogen receptor positive (HCC)  03/20/2018 Initial Diagnosis   Screening detected left breast spiculated mass by ultrasound measured 1.2 cm.  Biopsy revealed invasive ductal carcinoma grade 1-2 with high-grade DCIS, ER 95%, PR 95%, Ki-67 1%, HER-2 negative ratio 1.47, T1CN0 stage I a clinical stage   03/20/2018 Cancer Staging   Staging form: Breast, AJCC 8th Edition - Clinical stage from 03/20/2018: Stage IA (cT1c, cN0, cM0, G2, ER+, PR+, HER2-) - Signed by Crawford Morna Pickle, NP on 05/27/2018   04/08/2018 Genetic Testing   NF1  c.2325G>A (Silent) pathogenic variant was identified.  This variant was reclassified from a VUS to pathogenic on June 20, 2021.  The Common Hereditary Cancer Panel offered by [facility] includes sequencing and/or deletion duplication testing of the following 47 genes: APC, ATM, AXIN2, BARD1, BMPR1A, BRCA1, BRCA2, BRIP1, CDH1, CDKN2A (p14ARF), CDKN2A (p16INK4a), CKD4, CHEK2, CTNNA1, DICER1, EPCAM (Deletion/duplication testing only), GREM1 (promoter region deletion/duplication testing only), KIT, MEN1, MLH1, MSH2, MSH3, MSH6, MUTYH, NBN, NF1, NHTL1, PALB2, PDGFRA, PMS2, POLD1, POLE, PTEN, RAD50, RAD51C,  RAD51D, SDHB, SDHC, SDHD, SMAD4, SMARCA4. STK11, TP53, TSC1, TSC2, and VHL.  The following genes were evaluated for sequence changes only: SDHA and HOXB13 c.251G>A variant only.  Results: No pathogenic variants identified.  A Variant of uncertain significance in the gene NF1 was detected c.2325G>A (Silent). The date of this test report is 04/03/2018.    05/07/2018 Surgery   Left lumpectomy: IDC grade 1, 1.2 cm, intermediate grade DCIS, margins negative,PASH, 0/7 sentinel lymph nodes negative, ER 95%, PR 95%, Ki-67 1%, HER-2 negative ratio 1.47, T1CN0 stage Ia   05/07/2018 Cancer Staging   Staging form: Breast, AJCC 8th Edition - Pathologic stage from 05/07/2018: Stage IA (pT1c, pN0, cM0, G1, ER+, PR+, HER2-, Oncotype DX score: 11) - Signed by Crawford Morna Pickle, NP on 05/27/2018   05/25/2018 Oncotype testing   Oncotype DX score 11: 3% risk of distant recurrence of 9 years, low risk   06/24/2018 - 08/03/2018 Radiation Therapy   Adjuvant radiation therapy   09/2018 -  Anti-estrogen oral therapy   Tamoxifen  daily   GIST (gastrointestinal stromal tumor) of small bowel, malignant (HCC)  04/03/2021 Cancer Staging   Staging form: Gastrointestinal Stromal Tumor - Small Intestinal, Esophageal, Colorectal, Mesenteric, and Peritoneal GIST, AJCC 8th Edition - Pathologic stage from 04/03/2021: Stage IIIB (pT4, pN0, cM0, Mitotic Rate: High) - Signed by [name], MD on 04/23/2021 Stage prefix: Initial diagnosis Histologic grade (G): High grade Histologic grading system: 2 grade system Residual tumor (R): R0 - None   04/23/2021 Initial Diagnosis   GIST (gastrointestinal stromal tumor) of small bowel, malignant (HCC)   05/15/2021 Imaging   CT Chest w/o contrast  IMPRESSION: 1. 6 mm nodular opacity in the posterior right lower lobe  with a similar size focus of architectural distortion/scarring in this region on the remote study from 17 years ago, suggesting that this is simply scar although the finding  is more confluent on today's study. While almost assuredly benign, consider follow-up CT chest in 6 months to ensure stability. 2. No evidence for metastatic disease in the chest.   08/21/2021 Imaging   EXAM: CT ABDOMEN AND PELVIS WITH CONTRAST  IMPRESSION: Interval surgical resection of large left abdominal cystic mass since prior exam. No evidence of tumor recurrence or metastatic disease within the abdomen or pelvis.   8 mm low-attenuation lesion in pancreatic head, not definitely seen on prior exams. Consider further characterization with MRI, or continued surveillance on follow-up CT.   Small posterior uterine fibroid.   08/23/2021 Imaging   EXAM: MRI ABDOMEN WITHOUT AND WITH CONTRAST  IMPRESSION: 1. No evidence of pancreatic mass or suspicious correlate for the CT abnormality, which may have been artifactual or possibly related to heterogeneous fatty replacement. 2. No acute process or evidence of metastatic disease in the abdomen. 3.  Aortic Atherosclerosis (ICD10-I70.0).   04/09/2023 Pathology Results     FINAL MICROSCOPIC DIAGNOSIS:  A. FALLOPIAN TUBE, RIGHT, AND ADJACENT MASS, RESECTION: Fragmented recurrent gastrointestinal stromal tumor (GIST), aggregate tumor size 10.7 cm Ovarian tissue with serosal involvement by GIST - Background uninvolved ovary with normal physiologic changes Benign fallopian tube See comment  B. OVARY AND FALLOPIAN TUBE, LEFT, SALPINGO OOPHORECTOMY: Benign ovary with normal physiologic changes and follicular cyst, 3.0 cm No evidence of tumor involvement Benign fallopian tube  C. LEFT SIDED TUMOR CAPSULE, BIOPSY: Positive for gastrointestinal stromal tumor (GIST)  D. LEFT PELVIS PERITONEAL NODULE, BIOPSY: Positive for gastrointestinal stromal tumor (GIST)  E. RIGHT PELVIC SIDE WALL, BIOPSY: Positive for gastrointestinal stromal tumor (GIST)  F. APPENDIX, INCIDENTAL, APPENDECTOMY: Benign appendix with no significant pathologic  change No evidence of tumor involvement  G. UTERUS AND CERVIX, HYSTERECTOMY (121 GRAMS): Inactive pattern endometrium - No evidence of hyperplasia, atypia or malignancy Adenomyosis Leiomyomas Uterine serosal adhesions Cervix with no significant pathologic change No evidence of tumor involvement     Miscellaneous   TEMPUS  GEN OMIC VARIA N TS  Potentially Actionable / Biologically Relevant No reportable pathogenic variants were found.  Tumor / Normal Matched Analysis (Potential Germline) No normal sample was received, therefore tumor/normal matched analysis was not performed.  Pertinent Negatives  No pathogenic single nucleotide variants, indels, or copy number changes found in: IMMUN OTHERAPY MARKERS  Tumor Mutational Burden  1.13m/mb   12th percentile  Microsatellite Instability Status -Stable      Discussed the use of AI scribe software for clinical note transcription with the patient, who gave verbal consent to proceed.  History of Present Illness Anne Horton is a 48 year old female with breast cancer who presents for follow-up.  She has been on tamoxifen  for five years without side effects. She is not on Trematinib due to insurance issues. Her last mammogram was in July, with a follow-up scheduled for February, including an ultrasound. A recent CT scan showed lymph node involvement, and surgery was performed to remove affected tissue.  She experiences rib pain due to surgical procedures involving rib manipulation and scar tissue removal. She manages the pain with Tylenol  or Motrin  and does not require stronger pain medication.  Her platelet count is slightly elevated.     All other systems were reviewed with the patient and are negative.  MEDICAL HISTORY:  Past Medical History:  Diagnosis  Date   Allergy    Anemia    Iron  Deficiency   Anxiety    Breast cancer (HCC) 05/07/2018   left invasive ductal    Chest pain    Depression     Dysrhythmia    Palpitations   Family history of breast cancer    Fibromyalgia    neurofibromatosis   gist 2022   recurrence 2024   H/O: depression    Hx of migraines    Mitral regurgitation    mild per echo EF 55-60%   Neurofibromatosis (HCC)    Palpitations    Personal history of radiation therapy 08/10/2018   left breast 06/24/2018-08/10/2018  Dr Lynwood Nasuti   Tachycardia    Hx. of   Wears contact lenses     SURGICAL HISTORY: Past Surgical History:  Procedure Laterality Date   ANTRECTOMY N/A 01/29/2024   Procedure: ANTRECTOMY;  Surgeon: Aron Shoulders, MD;  Location: [facility];  Service: General;  Laterality: N/A;   BOWEL RESECTION N/A 04/03/2021   Procedure: SMALL BOWEL RESECTION;  Surgeon: Aron Shoulders, MD;  Location: [facility];  Service: General;  Laterality: N/A;   BREAST BIOPSY     BREAST LUMPECTOMY WITH RADIOACTIVE SEED AND SENTINEL LYMPH NODE BIOPSY Left 05/07/2018   Procedure: BREAST LUMPECTOMY WITH RADIOACTIVE SEED AND SENTINEL LYMPH NODE BIOPSY;  Surgeon: [name], MD;  Location: [facility];  Service: General;  Laterality: Left;   CESAREAN SECTION     CHEST TUBE INSERTION Bilateral 01/29/2024   Procedure: CHEST TUBE INSERTION;  Surgeon: Aron Shoulders, MD;  Location: [facility];  Service: General;  Laterality: Bilateral;   CHOLECYSTECTOMY N/A 01/04/2015   Procedure: LAPAROSCOPIC CHOLECYSTECTOMY WITH INTRAOPERATIVE CHOLANGIOGRAM;  Surgeon: Debby Shipper, MD;  Location: [facility];  Service: General;  Laterality: N/A;   COLONOSCOPY     ESOPHAGOGASTRODUODENOSCOPY     LAPAROSCOPY N/A 04/09/2023   Procedure: XI ROBOT ASSISTED DIAGNOSTIC LAPAROSCOPY, ROBOTIC assisted OPEN MASS EXCISION, TOTAL ROBOTIC HYSTERECTOMY WITH BILATERAL SALPINGO OOPHORECTOMY, CYSTOSCOPY;  Surgeon: Viktoria Comer SAUNDERS, MD;  Location: [facility];  Service: Gynecology;  Laterality: N/A;   LAPAROTOMY N/A 04/03/2021   Procedure: EXPLORATORY LAPAROTOMY WITH RESECTION OF ABDOMINAL  MASS;  Surgeon: Aron Shoulders, MD;  Location: [facility];  Service: General;  Laterality: N/A;   MASTOPEXY Bilateral 09/05/2020   Procedure: BILATERAL BREAST MASTOPEXY;  Surgeon: Arelia Filippo, MD;  Location: [facility];  Service: Plastics;  Laterality: Bilateral;   OMENTECTOMY N/A 04/03/2021   Procedure: OMENTECTOMY;  Surgeon: Aron Shoulders, MD;  Location: [facility];  Service: General;  Laterality: N/A;   RESECTION OF RETROPERITONEAL MASS N/A 01/29/2024   Procedure: REMOVAL OF MALIGNANT RETROPERITONEAL MASS;  Surgeon: Aron Shoulders, MD;  Location: [facility];  Service: General;  Laterality: N/A;  REMOVAL OF MALIGNANT RETROPERITONEAL MASS, 2ND RETOPERITONEAL MASS SPLENECTOMY,  ANTRECTOMY, PELVIC MASS EXCISION DEBULKING OF STROMAL TUMOR   ROBOTIC ASSISTED LAPAROSCOPIC LYSIS OF ADHESION N/A 04/09/2023   Procedure: XI ROBOTIC ASSISTED REMOVAL OF PELVIC MASS; INCIDENTAL LAPAROSCOPIC APPENDECTOMY;  Surgeon: Aron Shoulders, MD;  Location: [facility];  Service: General;  Laterality: N/A;   SPLENECTOMY, TOTAL N/A 01/29/2024   Procedure: SPLENECTOMY;  Surgeon: Aron Shoulders, MD;  Location: [facility];  Service: General;  Laterality: N/A;   WISDOM TOOTH EXTRACTION      I have reviewed the social history and family history with the patient and they are unchanged from previous note.  ALLERGIES:  is allergic to iron  sucrose, other, and latex.  MEDICATIONS:  Current Outpatient Medications  Medication Sig Dispense Refill   acetaminophen  (TYLENOL ) 500 MG tablet Take 1,000 mg by mouth every 6 (six) hours as needed for moderate pain (pain score 4-6).     ALPRAZolam  (XANAX ) 0.5 MG tablet Take 1/2 to 1 tablet by mouth 2 times a day as needed 180 tablet 1   ALPRAZolam  (XANAX ) 0.5 MG tablet Take 0.5-1 tablets (0.25-0.5 mg total) by mouth 2 (two) times daily as needed. 180 tablet 1   alum & mag hydroxide-simeth (MAALOX/MYLANTA) 200-200-20 MG/5ML suspension Take 30 mLs by mouth every 4 (four) hours as needed for  indigestion or heartburn. 355 mL 0   aspirin  EC 81 MG tablet Take 1 tablet (81 mg total) by mouth daily. Swallow whole. 30 tablet 12   CALCIUM  PO Take 2 tablets by mouth daily at 6 (six) AM. GUMMY     cetirizine (ZYRTEC) 10 MG tablet Take 10 mg by mouth daily.     feeding supplement (ENSURE PLUS HIGH PROTEIN) LIQD Take 237 mLs by mouth 2 (two) times daily between meals. 14220 mL 10   ibuprofen  (ADVIL ) 200 MG tablet Take 800 mg by mouth every 6 (six) hours as needed for moderate pain (pain score 4-6).     methocarbamol  (ROBAXIN ) 500 MG tablet Take 1 tablet (500 mg total) by mouth 3 (three) times daily. (Patient taking differently: Take 500 mg by mouth as needed.) 40 tablet 2   methocarbamol  (ROBAXIN ) 500 MG tablet Take 1 tablet (500 mg total) by mouth 3 (three) times daily. 40 tablet 2   metoprolol  succinate (TOPROL -XL) 50 MG 24 hr tablet Take 1 tablet (50 mg total) by mouth in the morning and at bedtime. 90 tablet 2   Multiple Vitamins-Minerals (HAIR SKIN & NAILS) TABS Take 2 tablets by mouth daily.     ondansetron  (ZOFRAN ) 8 MG tablet Take 1 tablet (8 mg total) by mouth every 8 (eight) hours as needed for nausea or vomiting. 60 tablet 2   pantoprazole  (PROTONIX ) 40 MG tablet Take 1 tablet (40 mg total) by mouth daily at 12 noon. 30 tablet 2   pantoprazole  (PROTONIX ) 40 MG tablet Take 1 tablet (40 mg total) by mouth daily. 30 tablet 11   polyethylene glycol (MIRALAX  / GLYCOLAX ) 17 g packet Take 17 g by mouth daily as needed for mild constipation. 14 each 0   polyethylene glycol powder (GLYCOLAX /MIRALAX ) 17 GM/SCOOP powder Mix 1 capful (17 g) and drink by mouth daily. 238 g 0   prochlorperazine  (COMPAZINE ) 10 MG tablet Take 1 tablet (10 mg total) by mouth every 6 (six) hours as needed for nausea or vomiting (Use for nausea and / or vomiting unresolved with ondansetron  (Zofran ). 30 tablet 0   propranolol  (INDERAL ) 10 MG tablet TAKE 1 TABLET BY MOUTH FOUR TIMES DAILY AS NEEDED FOR PALPITATIONS 120  tablet 3   sennosides-docusate sodium  (SENOKOT-S) 8.6-50 MG tablet Take 1 tablet by mouth 2 (two) times daily.     sodium chloride  flush (NS) 0.9 % SOLN 10 mLs by Intracatheter route as needed (flush).     tamoxifen  (NOLVADEX ) 20 MG tablet TAKE 1 TABLET BY MOUTH ONCE A DAY 90 tablet 3   venlafaxine  XR (EFFEXOR -XR) 75 MG 24 hr capsule Take 2 capsules (150 mg total) by mouth daily. 180 capsule 1   No current facility-administered medications for this visit.    PHYSICAL EXAMINATION: ECOG PERFORMANCE STATUS: 1 - Symptomatic but completely ambulatory  Vitals:   07/20/24 0827  BP: 116/68  Pulse: 94  Resp: 17  Temp: 98.3 F (36.8 C)  SpO2: 96%   Wt Readings from Last 3 Encounters:  07/20/24 116 lb 6.4 oz (52.8 kg)  04/28/24 118 lb (53.5 kg)  03/17/24 124 lb 9.6 oz (56.5 kg)     GENERAL:alert, no distress and comfortable SKIN: skin color, texture, turgor are normal, no rashes or significant lesions EYES: normal, Conjunctiva are pink and non-injected, sclera clear NECK: supple, thyroid  normal size, non-tender, without nodularity LYMPH:  no palpable lymphadenopathy in the cervical, axillary  LUNGS: clear to auscultation and percussion with normal breathing effort HEART: regular rate & rhythm and no murmurs and no lower extremity edema ABDOMEN:abdomen soft, non-tender and normal bowel sounds Musculoskeletal:no cyanosis of digits and no clubbing  NEURO: alert & oriented x 3 with fluent speech, no focal motor/sensory deficits Breasts: Breast inspection showed them to be symmetrical with no nipple discharge. Palpation of the breasts and axilla revealed no obvious mass that I could appreciate.  Physical Exam    LABORATORY DATA:  I have reviewed the data as listed    Latest Ref Rng & Units 02/12/2024    6:09 AM 02/06/2024    7:03 AM 02/05/2024    5:33 AM  CBC  WBC 4.0 - 10.5 K/uL 21.2  20.1  23.1   Hemoglobin 12.0 - 15.0 g/dL 9.3  9.4  89.9   Hematocrit 36.0 - 46.0 % 28.8  28.8   30.8   Platelets 150 - 400 K/uL 1,383  1,115  948         Latest Ref Rng & Units 02/19/2024    5:55 AM 02/18/2024    5:05 AM 02/17/2024    4:30 AM  CMP  Glucose 70 - 99 mg/dL 872  878  880   BUN 6 - 20 mg/dL 19  20  18    Creatinine 0.44 - 1.00 mg/dL 9.39  9.61  9.53   Sodium 135 - 145 mmol/L 136  135  138   Potassium 3.5 - 5.1 mmol/L 4.1  4.4  4.9   Chloride 98 - 111 mmol/L 102  103  105   CO2 22 - 32 mmol/L 26  25  29    Calcium  8.9 - 10.3 mg/dL 8.2  8.3  8.5   Total Protein 6.5 - 8.1 g/dL 5.5     Total Bilirubin 0.0 - 1.2 mg/dL 0.3     Alkaline Phos 38 - 126 U/L 56     AST 15 - 41 U/L 25     ALT 0 - 44 U/L 18      I reviewed her recent labs from [facility]   RADIOGRAPHIC STUDIES: I have personally reviewed the radiological images as listed and agreed with the findings in the report. No results found.    No orders of the defined types were placed in this encounter.  All questions were answered. The patient knows to call the clinic with any problems, questions or concerns. No barriers to learning was detected. The total time spent in the appointment was 20 minutes, including review of chart and various tests results, discussions about plan of care and coordination of care plan     Onita Mattock, MD 07/20/2024

## 2024-07-21 ENCOUNTER — Other Ambulatory Visit (HOSPITAL_COMMUNITY): Payer: Self-pay

## 2024-07-23 ENCOUNTER — Other Ambulatory Visit (HOSPITAL_COMMUNITY): Payer: Self-pay

## 2024-07-27 DIAGNOSIS — F439 Reaction to severe stress, unspecified: Secondary | ICD-10-CM | POA: Diagnosis not present

## 2024-08-27 ENCOUNTER — Ambulatory Visit: Attending: Cardiovascular Disease | Admitting: Cardiovascular Disease

## 2024-08-27 ENCOUNTER — Encounter: Payer: Self-pay | Admitting: Cardiovascular Disease

## 2024-08-27 VITALS — BP 100/64 | HR 77 | Ht 62.0 in | Wt 114.0 lb

## 2024-08-27 DIAGNOSIS — I480 Paroxysmal atrial fibrillation: Secondary | ICD-10-CM | POA: Diagnosis not present

## 2024-08-27 NOTE — Progress Notes (Signed)
 Cardiology Office Note:    Date:  08/27/2024   ID:  Anne Horton, DOB Jul 15, 1976, MRN [id number]  PCP:  Walke, [name], PA-C   [facility] Providers Cardiologist:  Ozell Fell, MD     Referring MD: [facility], [facility]   Chief Complaint  Patient presents with   Atrial Fibrillation    History of Present Illness:    Anne Horton is a 48 y.o. female with a hx of hypertension, tachycardia, and coronary calcium  score of 0.  She has been followed by Dr. Alveta and I will be assuming her care moving forward.  She also has a history of breast cancer and neurofibromatosis as well as gastrointestinal stromal tumor.  She had an episode of postoperative atrial fibrillation but has not been anticoagulated because of an isolated episode with no recurrence.  14-day ZIO monitor in July of this year showed sinus rhythm with no significant arrhythmia.  The patient is here alone today.  She has been doing well.  She has had no recurrent heart palpitations, chest pain, or shortness of breath.  She has no cardiac-related complaints today.  She tells me that she requires CT scans every 3 months to evaluate for recurrent GI stromal tumors.  Past Medical History:  Diagnosis Date   Allergy    Anemia    Iron  Deficiency   Anxiety    Breast cancer (HCC) 05/07/2018   left invasive ductal    Chest pain    Depression    Dysrhythmia    Palpitations   Family history of breast cancer    Fibromyalgia    neurofibromatosis   gist 2022   recurrence 2024   H/O: depression    Hx of migraines    Mitral regurgitation    mild per echo EF 55-60%   Neurofibromatosis (HCC)    Palpitations    Personal history of radiation therapy 08/10/2018   left breast 06/24/2018-08/10/2018  Dr Lynwood Nasuti   Tachycardia    Hx. of   Wears contact lenses     Current Medications: Active Medications[1]   Allergies:   Iron  sucrose, Other, and Latex   ROS:   Please see the history of  present illness.    Positive for easy bruising.  All other systems reviewed and are negative.  EKGs/Labs/Other Studies Reviewed:    The following studies were reviewed today: Cardiac Studies & Procedures   ______________________________________________________________________________________________     ECHOCARDIOGRAM  ECHOCARDIOGRAM COMPLETE 02/05/2024  Narrative ECHOCARDIOGRAM REPORT    [name] Date of Exam: 02/05/2024 Medical Rec #:  [id number]          Height:       62.0 in Accession #:    [id number]         Weight:       132.0 lb Date of Birth:  25-Oct-1975           BSA:          1.602 m Patient Age:    48 years           BP:           144/65 mmHg Patient Gender: F                  HR:           119 bpm. Exam Location:  Inpatient  Procedure: 2D Echo, Cardiac Doppler and Color Doppler (Both Spectral and Color Flow Doppler were  utilized during procedure).  Indications:    Atrial fibrillation  History:        Patient has no prior history of Echocardiogram examinations.  Sonographer:    Philomena Daring Referring Phys: [facility] PARCELLS  IMPRESSIONS   1. Left ventricular ejection fraction, by estimation, is 70 to 75%. The left ventricle has hyperdynamic function. The left ventricle has no regional wall motion abnormalities. There is mild left ventricular hypertrophy. Left ventricular diastolic parameters were normal. 2. Right ventricular systolic function is normal. The right ventricular size is normal. 3. A small pericardial effusion is present. 4. The mitral valve is normal in structure. No evidence of mitral valve regurgitation. No evidence of mitral stenosis. 5. The aortic valve is normal in structure. Aortic valve regurgitation is not visualized. No aortic stenosis is present. 6. The inferior vena cava is normal in size with greater than 50% respiratory variability, suggesting right atrial pressure of 3 mmHg.  FINDINGS Left Ventricle: Left  ventricular ejection fraction, by estimation, is 70 to 75%. The left ventricle has hyperdynamic function. The left ventricle has no regional wall motion abnormalities. The left ventricular internal cavity size was normal in size. There is mild left ventricular hypertrophy. Left ventricular diastolic parameters were normal.  Right Ventricle: The right ventricular size is normal. No increase in right ventricular wall thickness. Right ventricular systolic function is normal.  Left Atrium: Left atrial size was normal in size.  Right Atrium: Right atrial size was normal in size.  Pericardium: A small pericardial effusion is present.  Mitral Valve: The mitral valve is normal in structure. No evidence of mitral valve regurgitation. No evidence of mitral valve stenosis.  Tricuspid Valve: The tricuspid valve is normal in structure. Tricuspid valve regurgitation is not demonstrated. No evidence of tricuspid stenosis.  Aortic Valve: The aortic valve is normal in structure. Aortic valve regurgitation is not visualized. No aortic stenosis is present. Aortic valve mean gradient measures 6.0 mmHg. Aortic valve peak gradient measures 12.2 mmHg. Aortic valve area, by VTI measures 2.80 cm.  Pulmonic Valve: The pulmonic valve was normal in structure. Pulmonic valve regurgitation is not visualized. No evidence of pulmonic stenosis.  Aorta: The aortic root is normal in size and structure.  Venous: The inferior vena cava is normal in size with greater than 50% respiratory variability, suggesting right atrial pressure of 3 mmHg.  IAS/Shunts: No atrial level shunt detected by color flow Doppler.   LEFT VENTRICLE PLAX 2D LVIDd:         3.40 cm   Diastology LVIDs:         1.90 cm   LV e' medial:    7.18 cm/s LV PW:         1.10 cm   LV E/e' medial:  11.7 LV IVS:        1.20 cm   LV e' lateral:   12.00 cm/s LVOT diam:     2.00 cm   LV E/e' lateral: 7.0 LV SV:         67 LV SV Index:   42 LVOT Area:      3.14 cm   RIGHT VENTRICLE RV S prime:     20.60 cm/s TAPSE (M-mode): 1.6 cm  LEFT ATRIUM             Index        RIGHT ATRIUM          Index LA diam:        2.60 cm 1.62 cm/m  RA Area:     8.15 cm LA Vol (A2C):   15.6 ml 9.74 ml/m   RA Volume:   13.60 ml 8.49 ml/m LA Vol (A4C):   21.4 ml 13.36 ml/m LA Biplane Vol: 19.0 ml 11.86 ml/m AORTIC VALVE AV Area (Vmax):    2.76 cm AV Area (Vmean):   2.84 cm AV Area (VTI):     2.80 cm AV Vmax:           175.00 cm/s AV Vmean:          116.000 cm/s AV VTI:            0.239 m AV Peak Grad:      12.2 mmHg AV Mean Grad:      6.0 mmHg LVOT Vmax:         154.00 cm/s LVOT Vmean:        105.000 cm/s LVOT VTI:          0.213 m LVOT/AV VTI ratio: 0.89  AORTA Ao Root diam: 2.20 cm Ao Asc diam:  2.30 cm  MITRAL VALVE MV Area (PHT): 5.13 cm     SHUNTS MV Decel Time: 148 msec     Systemic VTI:  0.21 m MV E velocity: 83.80 cm/s   Systemic Diam: 2.00 cm MV A velocity: 111.00 cm/s MV E/A ratio:  0.75  Aditya Sabharwal Electronically signed by Ria Commander Signature Date/Time: 02/05/2024/10:58:50 AM    Final    MONITORS  LONG TERM MONITOR (3-14 DAYS) 04/13/2024  Narrative Patch Wear Time:  11 days and 19 hours (2025-07-02T14:39:01-399 to 2025-07-14T10:13:29-0400)  Patient had a min HR of 54 bpm, max HR of 188 bpm, and avg HR of 75 bpm. Predominant underlying rhythm was Sinus Rhythm. 2 Supraventricular Tachycardia runs occurred, the run with the fastest interval lasting 7 beats with a max rate of 188 bpm, the longest lasting 7 beats with an avg rate of 101 bpm. Isolated SVEs were rare (<1.0%), SVE Couplets were rare (<1.0%), and SVE Triplets were rare (<1.0%). Isolated VEs were rare (<1.0%), and no VE Couplets or VE Triplets were present.  Summary: Sinus rhythm, average HR 75 bpm. Rare ectopics. One short supraventricular run of 7 beats. No afib or flutter. Triggered events occur during sinus tachycardia.   CT SCANS  CT  CARDIAC SCORING (SELF PAY ONLY) 12/09/2023  Addendum 12/11/2023 10:08 PM ADDENDUM REPORT: 12/11/2023 22:05  EXAM: OVER-READ INTERPRETATION  CT CHEST  The following report is an over-read performed by radiologist Dr. Oneil Devonshire of [facility] on 12/11/2023. This over-read does not include interpretation of cardiac or coronary anatomy or pathology. The coronary calcium  score interpretation by the cardiologist is attached.  COMPARISON:  09/16/2023  FINDINGS: Cardiovascular: There are no significant extracardiac vascular findings.  Mediastinum/Nodes: There are no enlarged lymph nodes within the visualized mediastinum.  Lungs/Pleura: There is no pleural effusion. Lungs are well aerated bilaterally. A stable 6 mm nodule in the right lower lobe is noted best seen on image number 22 of series 4. No new nodules are noted.  Upper abdomen: Upper abdomen again demonstrates a hypodense lesion in the posterior aspect of the right lobe of the liver. This measures approximately 6 cm and greatest dimension has previously evaluated on multiple CTs of the abdomen pelvis.  Musculoskeletal/Chest wall: No chest wall mass or suspicious osseous findings within the visualized chest.  IMPRESSION: Stable 6 mm nodule in the right lower lobe.  Stable hypodense lesion in the posterior aspect of the right  lobe of the liver incompletely evaluated on this noncontrast study but previously delineated on multiple exams.   Electronically Signed By: Oneil Devonshire M.D. On: 12/11/2023 22:05  Narrative CLINICAL DATA:  Cardiovascular Disease Risk stratification  EXAM: Coronary Calcium  Score  TECHNIQUE: A gated, non-contrast computed tomography scan of the heart was performed using 3mm slice thickness. Axial images were analyzed on a dedicated workstation. Calcium  scoring of the coronary arteries was performed using the Agatston method.  FINDINGS: Coronary arteries: Normal  origins.  Coronary Calcium  Score:  Left main: 0  Left anterior descending artery: 0  Left circumflex artery: 0  Right coronary artery: 0  Total: 0  Percentile: 0  Pericardium: Normal.  Aorta: Normal caliber of ascending aorta. No aortic atherosclerosis noted.  Non-cardiac: See separate report from [facility].  IMPRESSION: Coronary calcium  score of 0. This was 0 percentile for age-, race-, and sex-matched controls.  RECOMMENDATIONS: Coronary artery calcium  (CAC) score is a strong predictor of incident coronary heart disease (CHD) and provides predictive information beyond traditional risk factors. CAC scoring is reasonable to use in the decision to withhold, postpone, or initiate statin therapy in intermediate-risk or selected borderline-risk asymptomatic adults (age 24-75 years and LDL-C >=70 to <190 mg/dL) who do not have diabetes or established atherosclerotic cardiovascular disease (ASCVD).* In intermediate-risk (10-year ASCVD risk >=7.5% to <20%) adults or selected borderline-risk (10-year ASCVD risk >=5% to <7.5%) adults in whom a CAC score is measured for the purpose of making a treatment decision the following recommendations have been made:  If CAC=0, it is reasonable to withhold statin therapy and reassess in 5 to 10 years, as long as higher risk conditions are absent (diabetes mellitus, family history of premature CHD in first degree relatives (males <55 years; females <65 years), cigarette smoking, or LDL >=190 mg/dL).  If CAC is 1 to 99, it is reasonable to initiate statin therapy for patients >=17 years of age.  If CAC is >=100 or >=75th percentile, it is reasonable to initiate statin therapy at any age.  Cardiology referral should be considered for patients with CAC scores >=400 or >=75th percentile.  *2018 AHA/ACC/AACVPR/AAPA/ABC/ACPM/ADA/AGS/APhA/ASPC/NLA/PCNA Guideline on the Management of Blood Cholesterol: A Report of the American  College of Cardiology/American Heart Association Task Force on Clinical Practice Guidelines. J Am Coll Cardiol. 2019;73(24):3168-3209.  Shelda Bruckner, MD  Electronically Signed: By: Shelda Bruckner M.D. On: 12/09/2023 16:41     ______________________________________________________________________________________________      EKG:        Recent Labs: 01/19/2024: TSH 1.140 02/12/2024: Hemoglobin 9.3; Platelets 1,383 02/19/2024: ALT 18; BUN 19; Creatinine, Ser 0.60; Magnesium  2.2; Potassium 4.1; Sodium 136  Recent Lipid Panel    Component Value Date/Time   CHOL 194 10/14/2023 1427   TRIG 107 02/16/2024 0500   HDL 46 10/14/2023 1427   CHOLHDL 4.2 10/14/2023 1427   CHOLHDL 4 03/25/2014 0830   VLDL 28.6 03/25/2014 0830   LDLCALC 106 (H) 10/14/2023 1427     Risk Assessment/Calculations:    CHA2DS2-VASc Score = 2   This indicates a 2.2% annual risk of stroke. The patient's score is based upon: CHF History: 0 HTN History: 1 Diabetes History: 0 Stroke History: 0 Vascular Disease History: 0 Age Score: 0 Gender Score: 1                Physical Exam:    VS:  BP 100/64 (BP Location: Right Arm, Patient Position: Sitting, Cuff Size: Normal)   Pulse 77   Ht 5' 2 (  1.575 m)   Wt 114 lb (51.7 kg)   LMP  (LMP Unknown) Comment: no chance per pt  SpO2 97%   BMI 20.85 kg/m     Wt Readings from Last 3 Encounters:  08/27/24 114 lb (51.7 kg)  07/20/24 116 lb 6.4 oz (52.8 kg)  04/28/24 118 lb (53.5 kg)     GEN:  Well nourished, well developed in no acute distress HEENT: Normal NECK: No JVD; No carotid bruits LYMPHATICS: No lymphadenopathy CARDIAC: RRR, no murmurs, rubs, gallops RESPIRATORY:  Clear to auscultation without rales, wheezing or rhonchi  ABDOMEN: Soft, non-tender, non-distended MUSCULOSKELETAL:  No edema; No deformity  SKIN: Warm and dry NEUROLOGIC:  Alert and oriented x 3 PSYCHIATRIC:  Normal affect   Assessment & Plan PAF (paroxysmal  atrial fibrillation) (HCC) The patient had isolated postoperative atrial fibrillation after major abdominal surgery.  She has had no recurrence.  Outpatient event monitoring has documented sinus rhythm with no atrial dysrhythmia.  Continue metoprolol  succinate at the current dose.  I talked to her about reducing her dose back to 50 mg once daily, but she does have a history of sinus tachycardia as well and she feels well on her current dosing schedule.  Will continue the same.  No indication for anticoagulation.  Okay for her to stop aspirin  81 mg daily as she complains of easy bruising.  Plan follow-up 1 year.      Medication Adjustments/Labs and Tests Ordered: Current medicines are reviewed at length with the patient today.  Concerns regarding medicines are outlined above.  No orders of the defined types were placed in this encounter.  No orders of the defined types were placed in this encounter.   Patient Instructions  Medication Instructions:  Stop Aspirin  *If you need a refill on your cardiac medications before your next appointment, please call your pharmacy*  Lab Work: NONE If you have labs (blood work) drawn today and your tests are completely normal, you will receive your results only by: MyChart Message (if you have MyChart) OR A paper copy in the mail If you have any lab test that is abnormal or we need to change your treatment, we will call you to review the results.  Testing/Procedures: NONE  Follow-Up: At [facility], you and your health needs are our priority.  As part of our continuing mission to provide you with exceptional heart care, our providers are all part of one team.  This team includes your primary Cardiologist (physician) and Advanced Practice Providers or APPs (Physician Assistants and Nurse Practitioners) who all work together to provide you with the care you need, when you need it.  Your next appointment:   1 year(s)  Provider:   Ozell Fell, MD    We recommend signing up for the patient portal called MyChart.  Sign up information is provided on this After Visit Summary.  MyChart is used to connect with patients for Virtual Visits (Telemedicine).  Patients are able to view lab/test results, encounter notes, upcoming appointments, etc.  Non-urgent messages can be sent to your provider as well.   To learn more about what you can do with MyChart, go to [email or web address].        Signed, Ozell Fell, MD  08/27/2024 1:01 PM    [facility]     [1]  Current Meds  Medication Sig   ALPRAZolam  (XANAX ) 0.5 MG tablet Take 0.5-1 tablets (0.25-0.5 mg total) by mouth 2 (two) times daily as needed.  CALCIUM  PO Take 2 tablets by mouth daily at 6 (six) AM. GUMMY   cetirizine (ZYRTEC) 10 MG tablet Take 10 mg by mouth daily.   methocarbamol  (ROBAXIN ) 500 MG tablet Take 1 tablet (500 mg total) by mouth 3 (three) times daily. (Patient taking differently: Take 500 mg by mouth as needed.)   metoprolol  succinate (TOPROL -XL) 50 MG 24 hr tablet Take 1 tablet (50 mg total) by mouth in the morning and at bedtime.   Multiple Vitamins-Minerals (HAIR SKIN & NAILS) TABS Take 2 tablets by mouth daily.   ondansetron  (ZOFRAN ) 8 MG tablet Take 1 tablet (8 mg total) by mouth every 8 (eight) hours as needed for nausea or vomiting. (Patient taking differently: Take 8 mg by mouth as needed.)   pantoprazole  (PROTONIX ) 40 MG tablet Take 1 tablet (40 mg total) by mouth daily at 12 noon.   propranolol  (INDERAL ) 10 MG tablet TAKE 1 TABLET BY MOUTH FOUR TIMES DAILY AS NEEDED FOR PALPITATIONS (Patient taking differently: Take 10 mg by mouth as needed.)   sennosides-docusate sodium  (SENOKOT-S) 8.6-50 MG tablet Take 1 tablet by mouth 2 (two) times daily. (Patient taking differently: Take 1 tablet by mouth as needed.)   tamoxifen  (NOLVADEX ) 20 MG tablet TAKE 1 TABLET BY MOUTH ONCE A DAY   venlafaxine  XR (EFFEXOR -XR) 75 MG 24 hr capsule Take 2  capsules (150 mg total) by mouth daily.   [DISCONTINUED] aspirin  EC 81 MG tablet Take 1 tablet (81 mg total) by mouth daily. Swallow whole.

## 2024-08-27 NOTE — Patient Instructions (Addendum)
 Medication Instructions:  Stop Aspirin  *If you need a refill on your cardiac medications before your next appointment, please call your pharmacy*  Lab Work: NONE If you have labs (blood work) drawn today and your tests are completely normal, you will receive your results only by: MyChart Message (if you have MyChart) OR A paper copy in the mail If you have any lab test that is abnormal or we need to change your treatment, we will call you to review the results.  Testing/Procedures: NONE  Follow-Up: At [facility], you and your health needs are our priority.  As part of our continuing mission to provide you with exceptional heart care, our providers are all part of one team.  This team includes your primary Cardiologist (physician) and Advanced Practice Providers or APPs (Physician Assistants and Nurse Practitioners) who all work together to provide you with the care you need, when you need it.  Your next appointment:   1 year(s)  Provider:   Ozell Fell, MD    We recommend signing up for the patient portal called MyChart.  Sign up information is provided on this After Visit Summary.  MyChart is used to connect with patients for Virtual Visits (Telemedicine).  Patients are able to view lab/test results, encounter notes, upcoming appointments, etc.  Non-urgent messages can be sent to your provider as well.   To learn more about what you can do with MyChart, go to [email or web address].

## 2024-08-30 ENCOUNTER — Other Ambulatory Visit: Payer: Self-pay | Admitting: Physician Assistant

## 2024-08-30 DIAGNOSIS — I480 Paroxysmal atrial fibrillation: Secondary | ICD-10-CM

## 2024-08-31 ENCOUNTER — Other Ambulatory Visit (HOSPITAL_COMMUNITY): Payer: Self-pay

## 2024-08-31 MED ORDER — METOPROLOL SUCCINATE ER 50 MG PO TB24
50.0000 mg | ORAL_TABLET | Freq: Two times a day (BID) | ORAL | 3 refills | Status: AC
  Filled 2024-08-31: qty 180, 90d supply, fill #0

## 2024-09-23 ENCOUNTER — Other Ambulatory Visit (HOSPITAL_COMMUNITY): Payer: Self-pay | Admitting: Physician Assistant

## 2024-09-23 DIAGNOSIS — C775 Secondary and unspecified malignant neoplasm of intrapelvic lymph nodes: Secondary | ICD-10-CM

## 2024-09-23 DIAGNOSIS — Z8509 Personal history of malignant neoplasm of other digestive organs: Secondary | ICD-10-CM

## 2024-09-27 ENCOUNTER — Other Ambulatory Visit (HOSPITAL_COMMUNITY): Payer: Self-pay

## 2024-09-27 ENCOUNTER — Other Ambulatory Visit: Payer: Self-pay

## 2024-09-27 MED ORDER — PANTOPRAZOLE SODIUM 40 MG PO TBEC
DELAYED_RELEASE_TABLET | ORAL | 11 refills | Status: AC
  Filled 2024-09-27: qty 90, 90d supply, fill #0

## 2024-09-30 ENCOUNTER — Ambulatory Visit (HOSPITAL_COMMUNITY)
Admission: RE | Admit: 2024-09-30 | Discharge: 2024-09-30 | Disposition: A | Discharge status: Home / Self Care | Source: Ambulatory Visit | Attending: Physician Assistant | Admitting: Physician Assistant

## 2024-09-30 DIAGNOSIS — Z8509 Personal history of malignant neoplasm of other digestive organs: Secondary | ICD-10-CM | POA: Insufficient documentation

## 2024-09-30 DIAGNOSIS — C775 Secondary and unspecified malignant neoplasm of intrapelvic lymph nodes: Secondary | ICD-10-CM | POA: Insufficient documentation

## 2024-09-30 MED ORDER — IOHEXOL 9 MG/ML PO SOLN
1000.0000 mL | ORAL | Status: AC
  Administered 2024-09-30: 1000 mL via ORAL

## 2024-09-30 MED ORDER — IOHEXOL 300 MG/ML  SOLN
80.0000 mL | Freq: Once | INTRAMUSCULAR | Status: AC | PRN
  Administered 2024-09-30: 80 mL via INTRAVENOUS

## 2024-10-01 ENCOUNTER — Ambulatory Visit (HOSPITAL_COMMUNITY)

## 2024-10-01 ENCOUNTER — Encounter: Payer: Self-pay | Admitting: Hematology

## 2024-10-04 ENCOUNTER — Other Ambulatory Visit: Payer: Self-pay

## 2024-10-04 ENCOUNTER — Other Ambulatory Visit (HOSPITAL_COMMUNITY): Payer: Self-pay

## 2024-10-05 ENCOUNTER — Other Ambulatory Visit (HOSPITAL_COMMUNITY): Payer: Self-pay

## 2024-10-05 ENCOUNTER — Other Ambulatory Visit: Payer: Self-pay

## 2024-10-05 MED ORDER — ONDANSETRON HCL 8 MG PO TABS
8.0000 mg | ORAL_TABLET | Freq: Three times a day (TID) | ORAL | 2 refills | Status: AC | PRN
  Filled 2024-10-05: qty 60, 20d supply, fill #0

## 2024-10-06 ENCOUNTER — Encounter: Payer: Self-pay | Admitting: Hematology

## 2024-10-06 ENCOUNTER — Other Ambulatory Visit (HOSPITAL_COMMUNITY): Payer: Self-pay

## 2024-10-07 ENCOUNTER — Other Ambulatory Visit (HOSPITAL_COMMUNITY): Payer: Self-pay

## 2024-10-07 MED ORDER — OXYCODONE HCL 5 MG PO TABS
5.0000 mg | ORAL_TABLET | Freq: Four times a day (QID) | ORAL | 0 refills | Status: AC | PRN
  Filled 2024-10-07 – 2024-10-14 (×3): qty 120, 30d supply, fill #0

## 2024-10-08 ENCOUNTER — Other Ambulatory Visit (HOSPITAL_COMMUNITY): Payer: Self-pay

## 2024-10-08 ENCOUNTER — Other Ambulatory Visit: Payer: Self-pay

## 2024-10-10 ENCOUNTER — Other Ambulatory Visit: Payer: Self-pay

## 2024-10-14 ENCOUNTER — Other Ambulatory Visit (HOSPITAL_COMMUNITY): Payer: Self-pay

## 2024-10-15 ENCOUNTER — Other Ambulatory Visit: Payer: Self-pay | Admitting: General Surgery

## 2024-10-15 ENCOUNTER — Encounter: Payer: Self-pay | Admitting: General Surgery

## 2024-10-15 ENCOUNTER — Other Ambulatory Visit (HOSPITAL_COMMUNITY): Payer: Self-pay

## 2024-10-15 DIAGNOSIS — C786 Secondary malignant neoplasm of retroperitoneum and peritoneum: Secondary | ICD-10-CM

## 2024-10-15 DIAGNOSIS — C49A3 Gastrointestinal stromal tumor of small intestine: Secondary | ICD-10-CM

## 2024-10-15 DIAGNOSIS — C787 Secondary malignant neoplasm of liver and intrahepatic bile duct: Secondary | ICD-10-CM

## 2024-10-17 ENCOUNTER — Other Ambulatory Visit (HOSPITAL_COMMUNITY): Payer: Self-pay

## 2024-10-18 NOTE — Telephone Encounter (Addendum)
 Multidisciplinary Oncology Program Intake Form  Referral Receive Date:  10/14/2024  Rapid Access Appointment : No -  Patient not in scope for Rapid Access  Reason for Referral:  Patient Diagnosis: GIST Initial Consultation: No treatment started for diagnosis Disease Group: [facility]:  Surgery and Medical Oncology  Referral Method:  CareEverywhere  Insurance Primary Insurance: Private/ACA/Employer    Authorization Obtained: No   Record Collection:    Imaging Date Requested:  10/14/2024 Date Received:  10/15/2024 External Facility Name:  [facility] Phone / Fax:  N/A Pt. Informed of possible charges associated with imaging? Yes ,  Pathology Date Requested: 10/18/2024 Date Received: 10/21/2024 External Facility Name:  [facility] Phone / Fax:  [phone] f. [phone] Pt. Informed of possible charges associated with pathology? Yes  Screening Questions:   If <43, is the patient interested in learning about [facility] Fertility Preservation? NA  Close the Loop Communication: Referring Provider Contacted: Yes Patient Contacted: Yes  Welcome Packet  Date Sent: 10/15/2024 Sent: Email

## 2024-10-19 ENCOUNTER — Other Ambulatory Visit

## 2024-10-20 ENCOUNTER — Ambulatory Visit (HOSPITAL_COMMUNITY): Admission: RE | Admit: 2024-10-20 | Discharge: 2024-10-20 | Attending: General Surgery | Admitting: General Surgery

## 2024-10-20 DIAGNOSIS — C787 Secondary malignant neoplasm of liver and intrahepatic bile duct: Secondary | ICD-10-CM

## 2024-10-20 DIAGNOSIS — C786 Secondary malignant neoplasm of retroperitoneum and peritoneum: Secondary | ICD-10-CM

## 2024-10-20 DIAGNOSIS — C49A3 Gastrointestinal stromal tumor of small intestine: Secondary | ICD-10-CM

## 2024-10-20 MED ORDER — IOHEXOL 300 MG/ML  SOLN
75.0000 mL | Freq: Once | INTRAMUSCULAR | Status: AC | PRN
  Administered 2024-10-20: 75 mL via INTRAVENOUS

## 2024-10-22 ENCOUNTER — Ambulatory Visit (HOSPITAL_COMMUNITY): Admission: RE | Admit: 2024-10-22

## 2024-10-22 DIAGNOSIS — C787 Secondary malignant neoplasm of liver and intrahepatic bile duct: Secondary | ICD-10-CM

## 2024-10-22 DIAGNOSIS — C786 Secondary malignant neoplasm of retroperitoneum and peritoneum: Secondary | ICD-10-CM

## 2024-10-22 DIAGNOSIS — C49A3 Gastrointestinal stromal tumor of small intestine: Secondary | ICD-10-CM

## 2024-10-22 MED ORDER — GADOBUTROL 1 MMOL/ML IV SOLN
5.0000 mL | Freq: Once | INTRAVENOUS | Status: AC | PRN
  Administered 2024-10-22: 5 mL via INTRAVENOUS

## 2024-10-22 MED ORDER — GADOBUTROL 1 MMOL/ML IV SOLN: 5.0000 mL | Freq: Once | INTRAVENOUS | Status: AC | PRN

## 2024-10-27 ENCOUNTER — Other Ambulatory Visit

## 2025-01-18 ENCOUNTER — Inpatient Hospital Stay: Attending: Hematology

## 2025-01-18 ENCOUNTER — Inpatient Hospital Stay: Admitting: Hematology
